# Patient Record
Sex: Female | Born: 1945
Health system: Southern US, Community
[De-identification: ages and names within clinical notes are randomized; demographics above are authoritative.]

## PROBLEM LIST (undated history)

## (undated) DIAGNOSIS — M26609 Unspecified temporomandibular joint disorder, unspecified side: Secondary | ICD-10-CM

## (undated) DIAGNOSIS — N318 Other neuromuscular dysfunction of bladder: Secondary | ICD-10-CM

## (undated) DIAGNOSIS — N959 Unspecified menopausal and perimenopausal disorder: Secondary | ICD-10-CM

## (undated) DIAGNOSIS — K6289 Other specified diseases of anus and rectum: Secondary | ICD-10-CM

## (undated) DIAGNOSIS — D509 Iron deficiency anemia, unspecified: Secondary | ICD-10-CM

## (undated) DIAGNOSIS — R252 Cramp and spasm: Secondary | ICD-10-CM

## (undated) DIAGNOSIS — IMO0001 Reserved for inherently not codable concepts without codable children: Secondary | ICD-10-CM

## (undated) DIAGNOSIS — K219 Gastro-esophageal reflux disease without esophagitis: Secondary | ICD-10-CM

## (undated) DIAGNOSIS — J309 Allergic rhinitis, unspecified: Secondary | ICD-10-CM

## (undated) DIAGNOSIS — K631 Perforation of intestine (nontraumatic): Secondary | ICD-10-CM

## (undated) DIAGNOSIS — M1990 Unspecified osteoarthritis, unspecified site: Secondary | ICD-10-CM

## (undated) DIAGNOSIS — K635 Polyp of colon: Secondary | ICD-10-CM

## (undated) DIAGNOSIS — E785 Hyperlipidemia, unspecified: Secondary | ICD-10-CM

## (undated) DIAGNOSIS — K649 Unspecified hemorrhoids: Secondary | ICD-10-CM

## (undated) DIAGNOSIS — Z8611 Personal history of tuberculosis: Secondary | ICD-10-CM

## (undated) DIAGNOSIS — K625 Hemorrhage of anus and rectum: Secondary | ICD-10-CM

## (undated) DIAGNOSIS — M5430 Sciatica, unspecified side: Secondary | ICD-10-CM

## (undated) DIAGNOSIS — R229 Localized swelling, mass and lump, unspecified: Secondary | ICD-10-CM

## (undated) DIAGNOSIS — J189 Pneumonia, unspecified organism: Secondary | ICD-10-CM

## (undated) DIAGNOSIS — L989 Disorder of the skin and subcutaneous tissue, unspecified: Secondary | ICD-10-CM

## (undated) DIAGNOSIS — M25559 Pain in unspecified hip: Secondary | ICD-10-CM

## (undated) DIAGNOSIS — M79609 Pain in unspecified limb: Secondary | ICD-10-CM

## (undated) DIAGNOSIS — I10 Essential (primary) hypertension: Secondary | ICD-10-CM

## (undated) DIAGNOSIS — Z86718 Personal history of other venous thrombosis and embolism: Secondary | ICD-10-CM

## (undated) DIAGNOSIS — M549 Dorsalgia, unspecified: Secondary | ICD-10-CM

## (undated) DIAGNOSIS — R350 Frequency of micturition: Secondary | ICD-10-CM

## (undated) DIAGNOSIS — N179 Acute kidney failure, unspecified: Secondary | ICD-10-CM

## (undated) DIAGNOSIS — Z7901 Long term (current) use of anticoagulants: Secondary | ICD-10-CM

## (undated) DIAGNOSIS — C801 Malignant (primary) neoplasm, unspecified: Secondary | ICD-10-CM

## (undated) DIAGNOSIS — G4700 Insomnia, unspecified: Secondary | ICD-10-CM

## (undated) DIAGNOSIS — I82409 Acute embolism and thrombosis of unspecified deep veins of unspecified lower extremity: Secondary | ICD-10-CM

## (undated) DIAGNOSIS — A159 Respiratory tuberculosis unspecified: Secondary | ICD-10-CM

## (undated) DIAGNOSIS — K5790 Diverticulosis of intestine, part unspecified, without perforation or abscess without bleeding: Secondary | ICD-10-CM

## (undated) HISTORY — DX: Pain in unspecified limb: M79.609

## (undated) HISTORY — DX: Cramp and spasm: R25.2

## (undated) HISTORY — DX: Allergic rhinitis, unspecified: J30.9

## (undated) HISTORY — DX: Long term (current) use of anticoagulants: Z79.01

## (undated) HISTORY — DX: Pneumonia, unspecified organism: J18.9

## (undated) HISTORY — DX: Insomnia, unspecified: G47.00

## (undated) HISTORY — DX: Hyperlipidemia, unspecified: E78.5

## (undated) HISTORY — PX: OTHER SURGICAL HISTORY: SHX169

## (undated) HISTORY — DX: Personal history of other venous thrombosis and embolism: Z86.718

## (undated) HISTORY — DX: Unspecified temporomandibular joint disorder, unspecified side: M26.609

## (undated) HISTORY — PX: JOINT REPLACEMENT: SHX530

## (undated) HISTORY — DX: Personal history of tuberculosis: Z86.11

## (undated) HISTORY — DX: Essential (primary) hypertension: I10

## (undated) HISTORY — PX: COLONOSCOPY: SHX174

## (undated) HISTORY — DX: Pain in unspecified hip: M25.559

## (undated) HISTORY — DX: Unspecified menopausal and perimenopausal disorder: N95.9

## (undated) HISTORY — DX: Unspecified osteoarthritis, unspecified site: M19.90

## (undated) HISTORY — DX: Localized swelling, mass and lump, unspecified: R22.9

## (undated) HISTORY — DX: Dorsalgia, unspecified: M54.9

## (undated) HISTORY — DX: Frequency of micturition: R35.0

## (undated) HISTORY — DX: Iron deficiency anemia, unspecified: D50.9

## (undated) HISTORY — DX: Acute embolism and thrombosis of unspecified deep veins of unspecified lower extremity: I82.409

## (undated) HISTORY — DX: Polyp of colon: K63.5

## (undated) HISTORY — DX: Unspecified hemorrhoids: K64.9

## (undated) HISTORY — DX: Other neuromuscular dysfunction of bladder: N31.8

## (undated) HISTORY — DX: Gastro-esophageal reflux disease without esophagitis: K21.9

## (undated) HISTORY — PX: ABDOMINAL HYSTERECTOMY: SHX81

## (undated) HISTORY — DX: Sciatica, unspecified side: M54.30

## (undated) HISTORY — DX: Diverticulosis of intestine, part unspecified, without perforation or abscess without bleeding: K57.90

## (undated) HISTORY — PX: CHOLECYSTECTOMY OPEN: SUR202

## (undated) HISTORY — DX: Disorder of the skin and subcutaneous tissue, unspecified: L98.9

## (undated) HISTORY — DX: Hemorrhage of anus and rectum: K62.5

---

## 1998-11-11 ENCOUNTER — Encounter: Payer: Self-pay | Admitting: General Surgery

## 1998-11-11 ENCOUNTER — Observation Stay (HOSPITAL_COMMUNITY): Admission: RE | Admit: 1998-11-11 | Discharge: 1998-11-12 | Payer: Self-pay | Admitting: General Surgery

## 2000-05-01 HISTORY — PX: INCISIONAL HERNIA REPAIR: SHX193

## 2000-05-30 ENCOUNTER — Other Ambulatory Visit: Admission: RE | Admit: 2000-05-30 | Discharge: 2000-05-30 | Payer: Self-pay | Admitting: Obstetrics and Gynecology

## 2000-12-25 ENCOUNTER — Emergency Department (HOSPITAL_COMMUNITY): Admission: EM | Admit: 2000-12-25 | Discharge: 2000-12-25 | Payer: Self-pay | Admitting: Emergency Medicine

## 2001-04-12 ENCOUNTER — Encounter (HOSPITAL_BASED_OUTPATIENT_CLINIC_OR_DEPARTMENT_OTHER): Payer: Self-pay | Admitting: General Surgery

## 2001-04-16 ENCOUNTER — Ambulatory Visit (HOSPITAL_COMMUNITY): Admission: RE | Admit: 2001-04-16 | Discharge: 2001-04-16 | Payer: Self-pay | Admitting: General Surgery

## 2001-04-16 ENCOUNTER — Encounter (INDEPENDENT_AMBULATORY_CARE_PROVIDER_SITE_OTHER): Payer: Self-pay | Admitting: *Deleted

## 2002-03-10 ENCOUNTER — Ambulatory Visit (HOSPITAL_COMMUNITY): Admission: RE | Admit: 2002-03-10 | Discharge: 2002-03-10 | Payer: Self-pay | Admitting: Obstetrics and Gynecology

## 2002-03-10 ENCOUNTER — Encounter: Payer: Self-pay | Admitting: Family Medicine

## 2002-10-17 ENCOUNTER — Ambulatory Visit (HOSPITAL_COMMUNITY): Admission: RE | Admit: 2002-10-17 | Discharge: 2002-10-17 | Payer: Self-pay | Admitting: Gastroenterology

## 2004-07-14 ENCOUNTER — Emergency Department (HOSPITAL_COMMUNITY): Admission: EM | Admit: 2004-07-14 | Discharge: 2004-07-14 | Payer: Self-pay | Admitting: Emergency Medicine

## 2004-08-15 ENCOUNTER — Ambulatory Visit: Payer: Self-pay | Admitting: Internal Medicine

## 2004-08-15 ENCOUNTER — Ambulatory Visit: Payer: Self-pay | Admitting: *Deleted

## 2004-09-12 ENCOUNTER — Ambulatory Visit: Payer: Self-pay | Admitting: Internal Medicine

## 2004-12-05 ENCOUNTER — Ambulatory Visit: Admission: RE | Admit: 2004-12-05 | Discharge: 2004-12-05 | Payer: Self-pay | Admitting: General Surgery

## 2004-12-05 ENCOUNTER — Ambulatory Visit: Payer: Self-pay | Admitting: Internal Medicine

## 2005-01-07 ENCOUNTER — Emergency Department (HOSPITAL_COMMUNITY): Admission: EM | Admit: 2005-01-07 | Discharge: 2005-01-07 | Payer: Self-pay | Admitting: Emergency Medicine

## 2005-01-13 ENCOUNTER — Inpatient Hospital Stay (HOSPITAL_COMMUNITY): Admission: RE | Admit: 2005-01-13 | Discharge: 2005-01-16 | Payer: Self-pay | Admitting: General Surgery

## 2005-01-13 ENCOUNTER — Encounter (INDEPENDENT_AMBULATORY_CARE_PROVIDER_SITE_OTHER): Payer: Self-pay | Admitting: Specialist

## 2005-01-13 HISTORY — PX: INCISIONAL HERNIA REPAIR: SHX193

## 2005-02-02 ENCOUNTER — Ambulatory Visit: Payer: Self-pay | Admitting: Internal Medicine

## 2005-06-29 ENCOUNTER — Ambulatory Visit: Payer: Self-pay | Admitting: Internal Medicine

## 2005-08-18 ENCOUNTER — Ambulatory Visit: Payer: Self-pay | Admitting: Endocrinology

## 2005-09-12 ENCOUNTER — Ambulatory Visit: Payer: Self-pay | Admitting: Internal Medicine

## 2005-10-03 ENCOUNTER — Ambulatory Visit: Payer: Self-pay | Admitting: Internal Medicine

## 2005-10-11 ENCOUNTER — Encounter: Admission: RE | Admit: 2005-10-11 | Discharge: 2005-10-11 | Payer: Self-pay | Admitting: Internal Medicine

## 2006-01-09 ENCOUNTER — Ambulatory Visit: Payer: Self-pay | Admitting: Internal Medicine

## 2006-01-19 ENCOUNTER — Ambulatory Visit: Payer: Self-pay | Admitting: Cardiovascular Disease

## 2006-02-02 ENCOUNTER — Ambulatory Visit: Payer: Self-pay | Admitting: Endocrinology

## 2006-02-09 ENCOUNTER — Ambulatory Visit: Payer: Self-pay | Admitting: Cardiology

## 2006-03-02 ENCOUNTER — Ambulatory Visit: Payer: Self-pay | Admitting: Cardiology

## 2006-03-20 ENCOUNTER — Ambulatory Visit: Payer: Self-pay | Admitting: Cardiovascular Disease

## 2006-04-19 ENCOUNTER — Ambulatory Visit: Payer: Self-pay | Admitting: Cardiology

## 2006-04-26 ENCOUNTER — Ambulatory Visit: Payer: Self-pay | Admitting: Internal Medicine

## 2006-04-26 LAB — CONVERTED CEMR LAB
BUN: 11 mg/dL (ref 6–23)
Chloride: 110 meq/L (ref 96–112)
Creatinine, Ser: 1.1 mg/dL (ref 0.4–1.2)
Eosinophil percent: 2.3 % (ref 0.0–5.0)
GFR calc non Af Amer: 54 mL/min
Glucose, Bld: 109 mg/dL — ABNORMAL HIGH (ref 70–99)
HCT: 32.9 % — ABNORMAL LOW (ref 36.0–46.0)
Hemoglobin: 10.6 g/dL — ABNORMAL LOW (ref 12.0–15.0)
Lymphocytes Relative: 21.2 % (ref 12.0–46.0)
MCHC: 32.2 g/dL (ref 30.0–36.0)
Neutro Abs: 5.1 10*3/uL (ref 1.4–7.7)
Neutrophils Relative %: 72.1 % (ref 43.0–77.0)
Potassium: 3.6 meq/L (ref 3.5–5.1)
RBC: 4.81 M/uL (ref 3.87–5.11)
RDW: 17.8 % — ABNORMAL HIGH (ref 11.5–14.6)
Sodium: 144 meq/L (ref 135–145)
Total Bilirubin: 0.6 mg/dL (ref 0.3–1.2)

## 2006-05-08 ENCOUNTER — Ambulatory Visit: Payer: Self-pay | Admitting: Internal Medicine

## 2006-05-08 LAB — CONVERTED CEMR LAB
Basophils Absolute: 0.1 10*3/uL (ref 0.0–0.1)
Basophils Relative: 0.7 % (ref 0.0–1.0)
Eosinophil percent: 2.1 % (ref 0.0–5.0)
Folate: 3.9 ng/mL
HCT: 33.7 % — ABNORMAL LOW (ref 36.0–46.0)
MCV: 68.5 fL — ABNORMAL LOW (ref 78.0–100.0)
Monocytes Absolute: 0.4 10*3/uL (ref 0.2–0.7)
Monocytes Relative: 5.4 % (ref 3.0–11.0)
Neutro Abs: 5 10*3/uL (ref 1.4–7.7)
Neutrophils Relative %: 69.2 % (ref 43.0–77.0)
Platelets: 521 10*3/uL — ABNORMAL HIGH (ref 150–400)
RBC: 4.92 M/uL (ref 3.87–5.11)
RDW: 17.7 % — ABNORMAL HIGH (ref 11.5–14.6)

## 2006-05-21 ENCOUNTER — Ambulatory Visit: Payer: Self-pay | Admitting: Cardiology

## 2006-06-18 ENCOUNTER — Ambulatory Visit: Payer: Self-pay | Admitting: Internal Medicine

## 2006-09-05 ENCOUNTER — Ambulatory Visit: Payer: Self-pay | Admitting: Internal Medicine

## 2006-09-05 LAB — CONVERTED CEMR LAB
Alkaline Phosphatase: 128 units/L — ABNORMAL HIGH (ref 39–117)
BUN: 9 mg/dL (ref 6–23)
Basophils Absolute: 0 10*3/uL (ref 0.0–0.1)
Bilirubin Urine: NEGATIVE
CO2: 30 meq/L (ref 19–32)
Chloride: 110 meq/L (ref 96–112)
Creatinine, Ser: 0.9 mg/dL (ref 0.4–1.2)
GFR calc Af Amer: 83 mL/min
GFR calc non Af Amer: 68 mL/min
HCT: 35.8 % — ABNORMAL LOW (ref 36.0–46.0)
Lymphocytes Relative: 19.7 % (ref 12.0–46.0)
Monocytes Relative: 4.5 % (ref 3.0–11.0)
Neutrophils Relative %: 72.5 % (ref 43.0–77.0)
Nitrite: NEGATIVE
Platelets: 439 10*3/uL — ABNORMAL HIGH (ref 150–400)
Potassium: 3.8 meq/L (ref 3.5–5.1)
TSH: 0.81 microintl units/mL (ref 0.35–5.50)
Total Bilirubin: 1 mg/dL (ref 0.3–1.2)
Total Protein, Urine: NEGATIVE mg/dL
Triglycerides: 143 mg/dL (ref 0–149)
Urobilinogen, UA: 0.2 (ref 0.0–1.0)
VLDL: 29 mg/dL (ref 0–40)

## 2006-11-17 DIAGNOSIS — I10 Essential (primary) hypertension: Secondary | ICD-10-CM

## 2006-11-17 DIAGNOSIS — Z8611 Personal history of tuberculosis: Secondary | ICD-10-CM

## 2006-11-17 DIAGNOSIS — M26609 Unspecified temporomandibular joint disorder, unspecified side: Secondary | ICD-10-CM | POA: Insufficient documentation

## 2006-11-17 DIAGNOSIS — E785 Hyperlipidemia, unspecified: Secondary | ICD-10-CM | POA: Insufficient documentation

## 2006-11-17 DIAGNOSIS — Z86718 Personal history of other venous thrombosis and embolism: Secondary | ICD-10-CM | POA: Insufficient documentation

## 2006-11-17 DIAGNOSIS — K649 Unspecified hemorrhoids: Secondary | ICD-10-CM | POA: Insufficient documentation

## 2006-11-17 HISTORY — DX: Unspecified temporomandibular joint disorder, unspecified side: M26.609

## 2006-11-17 HISTORY — DX: Personal history of other venous thrombosis and embolism: Z86.718

## 2006-11-17 HISTORY — DX: Unspecified hemorrhoids: K64.9

## 2006-11-17 HISTORY — DX: Essential (primary) hypertension: I10

## 2006-11-17 HISTORY — DX: Personal history of tuberculosis: Z86.11

## 2006-12-05 DIAGNOSIS — K219 Gastro-esophageal reflux disease without esophagitis: Secondary | ICD-10-CM

## 2006-12-05 HISTORY — DX: Gastro-esophageal reflux disease without esophagitis: K21.9

## 2007-09-05 ENCOUNTER — Ambulatory Visit: Payer: Self-pay | Admitting: Internal Medicine

## 2007-09-05 LAB — CONVERTED CEMR LAB
BUN: 13 mg/dL (ref 6–23)
Bilirubin, Direct: 0.1 mg/dL (ref 0.0–0.3)
CO2: 29 meq/L (ref 19–32)
Creatinine, Ser: 0.9 mg/dL (ref 0.4–1.2)
Crystals: NEGATIVE
Eosinophils Absolute: 0.2 10*3/uL (ref 0.0–0.7)
Eosinophils Relative: 2.5 % (ref 0.0–5.0)
GFR calc non Af Amer: 68 mL/min
Glucose, Bld: 96 mg/dL (ref 70–99)
HDL: 30 mg/dL — ABNORMAL LOW (ref >39.0)
Ketones, ur: NEGATIVE mg/dL
LDL Cholesterol: 123 mg/dL — ABNORMAL HIGH (ref 0–99)
Mucus, UA: NEGATIVE
Neutro Abs: 5.2 10*3/uL (ref 1.4–7.7)
Platelets: 396 10*3/uL (ref 150–400)
RBC / HPF: NONE SEEN
RDW: 16 % — ABNORMAL HIGH (ref 11.5–14.6)
Specific Gravity, Urine: 1.01 (ref 1.000–1.03)
TSH: 0.6 microintl units/mL (ref 0.35–5.50)
Total Bilirubin: 1 mg/dL (ref 0.3–1.2)
Total Protein, Urine: NEGATIVE mg/dL
Urine Glucose: NEGATIVE mg/dL
VLDL: 25 mg/dL (ref 0–40)
WBC, UA: NONE SEEN cells/hpf
pH: 6 (ref 5.0–8.0)

## 2007-09-11 ENCOUNTER — Encounter: Admission: RE | Admit: 2007-09-11 | Discharge: 2007-09-11 | Payer: Self-pay | Admitting: Internal Medicine

## 2007-09-12 ENCOUNTER — Ambulatory Visit: Payer: Self-pay | Admitting: Internal Medicine

## 2007-09-12 DIAGNOSIS — J309 Allergic rhinitis, unspecified: Secondary | ICD-10-CM

## 2007-09-12 DIAGNOSIS — M5430 Sciatica, unspecified side: Secondary | ICD-10-CM

## 2007-09-12 DIAGNOSIS — R229 Localized swelling, mass and lump, unspecified: Secondary | ICD-10-CM

## 2007-09-12 DIAGNOSIS — K921 Melena: Secondary | ICD-10-CM

## 2007-09-12 DIAGNOSIS — E785 Hyperlipidemia, unspecified: Secondary | ICD-10-CM

## 2007-09-12 HISTORY — DX: Allergic rhinitis, unspecified: J30.9

## 2007-09-12 HISTORY — DX: Sciatica, unspecified side: M54.30

## 2007-09-12 HISTORY — DX: Hyperlipidemia, unspecified: E78.5

## 2007-09-12 HISTORY — DX: Localized swelling, mass and lump, unspecified: R22.9

## 2007-09-13 ENCOUNTER — Encounter (INDEPENDENT_AMBULATORY_CARE_PROVIDER_SITE_OTHER): Payer: Self-pay | Admitting: *Deleted

## 2007-09-17 ENCOUNTER — Encounter: Payer: Self-pay | Admitting: Internal Medicine

## 2007-10-07 ENCOUNTER — Ambulatory Visit: Payer: Self-pay | Admitting: Gastroenterology

## 2007-10-07 DIAGNOSIS — K625 Hemorrhage of anus and rectum: Secondary | ICD-10-CM

## 2007-10-07 HISTORY — DX: Hemorrhage of anus and rectum: K62.5

## 2007-10-14 ENCOUNTER — Encounter: Payer: Self-pay | Admitting: Gastroenterology

## 2007-10-14 ENCOUNTER — Ambulatory Visit: Payer: Self-pay | Admitting: Gastroenterology

## 2007-10-16 ENCOUNTER — Encounter: Payer: Self-pay | Admitting: Gastroenterology

## 2008-06-19 ENCOUNTER — Ambulatory Visit: Payer: Self-pay | Admitting: Internal Medicine

## 2008-06-19 DIAGNOSIS — G4700 Insomnia, unspecified: Secondary | ICD-10-CM

## 2008-06-19 DIAGNOSIS — M79609 Pain in unspecified limb: Secondary | ICD-10-CM | POA: Insufficient documentation

## 2008-06-19 DIAGNOSIS — M549 Dorsalgia, unspecified: Secondary | ICD-10-CM | POA: Insufficient documentation

## 2008-06-19 HISTORY — DX: Insomnia, unspecified: G47.00

## 2008-06-19 HISTORY — DX: Dorsalgia, unspecified: M54.9

## 2008-06-19 HISTORY — DX: Pain in unspecified limb: M79.609

## 2008-07-10 ENCOUNTER — Telehealth (INDEPENDENT_AMBULATORY_CARE_PROVIDER_SITE_OTHER): Payer: Self-pay | Admitting: *Deleted

## 2008-07-28 ENCOUNTER — Telehealth: Payer: Self-pay | Admitting: Internal Medicine

## 2008-09-10 ENCOUNTER — Ambulatory Visit: Payer: Self-pay | Admitting: Internal Medicine

## 2008-09-10 LAB — CONVERTED CEMR LAB
AST: 17 units/L (ref 0–37)
BUN: 9 mg/dL (ref 6–23)
Basophils Relative: 2.7 % (ref 0.0–3.0)
Bilirubin Urine: NEGATIVE
Bilirubin, Direct: 0.2 mg/dL (ref 0.0–0.3)
Creatinine, Ser: 1 mg/dL (ref 0.4–1.2)
Eosinophils Absolute: 0.1 10*3/uL (ref 0.0–0.7)
HDL: 39.6 mg/dL (ref >39.00)
Leukocytes, UA: NEGATIVE
Lymphocytes Relative: 22.1 % (ref 12.0–46.0)
Lymphs Abs: 1.6 10*3/uL (ref 0.7–4.0)
Neutro Abs: 5.3 10*3/uL (ref 1.4–7.7)
Neutrophils Relative %: 70.7 % (ref 43.0–77.0)
Nitrite: NEGATIVE
Potassium: 4.1 meq/L (ref 3.5–5.1)
RBC: 5.08 M/uL (ref 3.87–5.11)
Sodium: 141 meq/L (ref 135–145)
Specific Gravity, Urine: 1.01 (ref 1.000–1.030)
Total Bilirubin: 1 mg/dL (ref 0.3–1.2)
Total CHOL/HDL Ratio: 4
Total Protein: 8.7 g/dL — ABNORMAL HIGH (ref 6.0–8.3)
Urine Glucose: NEGATIVE mg/dL
Urobilinogen, UA: 0.2 (ref 0.0–1.0)
pH: 5.5 (ref 5.0–8.0)

## 2008-09-15 ENCOUNTER — Encounter: Admission: RE | Admit: 2008-09-15 | Discharge: 2008-09-15 | Payer: Self-pay | Admitting: Internal Medicine

## 2008-09-15 ENCOUNTER — Ambulatory Visit: Payer: Self-pay | Admitting: Internal Medicine

## 2008-09-17 LAB — CONVERTED CEMR LAB
Alpha-1-Globulin: 4.4 % (ref 2.9–4.9)
Alpha-2-Globulin: 11.5 % (ref 7.1–11.8)

## 2009-01-06 ENCOUNTER — Telehealth: Payer: Self-pay | Admitting: Internal Medicine

## 2009-04-28 ENCOUNTER — Telehealth: Payer: Self-pay | Admitting: Internal Medicine

## 2009-05-01 HISTORY — PX: OTHER SURGICAL HISTORY: SHX169

## 2009-07-20 ENCOUNTER — Ambulatory Visit: Payer: Self-pay | Admitting: Internal Medicine

## 2009-07-20 DIAGNOSIS — L989 Disorder of the skin and subcutaneous tissue, unspecified: Secondary | ICD-10-CM

## 2009-07-20 HISTORY — DX: Disorder of the skin and subcutaneous tissue, unspecified: L98.9

## 2009-08-02 ENCOUNTER — Encounter: Admission: RE | Admit: 2009-08-02 | Discharge: 2009-08-02 | Payer: Self-pay | Admitting: Orthopedic Surgery

## 2009-08-11 ENCOUNTER — Ambulatory Visit: Payer: Self-pay | Admitting: Internal Medicine

## 2009-08-11 DIAGNOSIS — R252 Cramp and spasm: Secondary | ICD-10-CM

## 2009-08-11 DIAGNOSIS — M25559 Pain in unspecified hip: Secondary | ICD-10-CM

## 2009-08-11 HISTORY — DX: Cramp and spasm: R25.2

## 2009-08-11 HISTORY — DX: Pain in unspecified hip: M25.559

## 2009-09-16 ENCOUNTER — Ambulatory Visit: Payer: Self-pay | Admitting: Internal Medicine

## 2009-09-16 LAB — CONVERTED CEMR LAB
ALT: 20 units/L (ref 0–35)
Alkaline Phosphatase: 110 units/L (ref 39–117)
BUN: 15 mg/dL (ref 6–23)
Basophils Absolute: 0 10*3/uL (ref 0.0–0.1)
Basophils Relative: 0.5 % (ref 0.0–3.0)
Bilirubin Urine: NEGATIVE
Calcium: 8.9 mg/dL (ref 8.4–10.5)
Chloride: 108 meq/L (ref 96–112)
Cholesterol: 148 mg/dL (ref 0–200)
Creatinine, Ser: 0.9 mg/dL (ref 0.4–1.2)
GFR calc non Af Amer: 79.6 mL/min (ref >60)
HCT: 35.8 % — ABNORMAL LOW (ref 36.0–46.0)
Hemoglobin: 12.1 g/dL (ref 12.0–15.0)
LDL Cholesterol: 79 mg/dL (ref 0–99)
Lymphs Abs: 1.8 10*3/uL (ref 0.7–4.0)
MCHC: 33.8 g/dL (ref 30.0–36.0)
MCV: 78 fL (ref 78.0–100.0)
Monocytes Absolute: 0.4 10*3/uL (ref 0.1–1.0)
Neutrophils Relative %: 64.4 % (ref 43.0–77.0)
Nitrite: NEGATIVE
RDW: 17.9 % — ABNORMAL HIGH (ref 11.5–14.6)
Sodium: 141 meq/L (ref 135–145)
Specific Gravity, Urine: 1.015 (ref 1.000–1.030)
Total Protein, Urine: NEGATIVE mg/dL
Urine Glucose: NEGATIVE mg/dL
Urobilinogen, UA: 0.2 (ref 0.0–1.0)
VLDL: 31 mg/dL (ref 0.0–40.0)
pH: 7 (ref 5.0–8.0)

## 2009-09-17 ENCOUNTER — Encounter: Admission: RE | Admit: 2009-09-17 | Discharge: 2009-09-17 | Payer: Self-pay | Admitting: Internal Medicine

## 2009-09-20 ENCOUNTER — Ambulatory Visit: Payer: Self-pay | Admitting: Internal Medicine

## 2009-09-20 DIAGNOSIS — N959 Unspecified menopausal and perimenopausal disorder: Secondary | ICD-10-CM

## 2009-09-20 HISTORY — DX: Unspecified menopausal and perimenopausal disorder: N95.9

## 2009-09-28 ENCOUNTER — Encounter: Admission: RE | Admit: 2009-09-28 | Discharge: 2009-09-28 | Payer: Self-pay | Admitting: Orthopedic Surgery

## 2009-09-28 ENCOUNTER — Ambulatory Visit: Payer: Self-pay | Admitting: Internal Medicine

## 2009-09-28 ENCOUNTER — Encounter: Payer: Self-pay | Admitting: Internal Medicine

## 2009-10-07 ENCOUNTER — Ambulatory Visit: Payer: Self-pay | Admitting: Internal Medicine

## 2009-10-07 DIAGNOSIS — R350 Frequency of micturition: Secondary | ICD-10-CM

## 2009-10-07 HISTORY — DX: Frequency of micturition: R35.0

## 2009-10-07 LAB — CONVERTED CEMR LAB
Bilirubin Urine: NEGATIVE
Ketones, ur: NEGATIVE mg/dL
Urine Glucose: NEGATIVE mg/dL

## 2009-10-12 ENCOUNTER — Emergency Department (HOSPITAL_COMMUNITY): Admission: EM | Admit: 2009-10-12 | Discharge: 2009-10-12 | Payer: Self-pay | Admitting: Family Medicine

## 2009-11-02 ENCOUNTER — Ambulatory Visit: Payer: Self-pay | Admitting: Internal Medicine

## 2009-11-11 ENCOUNTER — Telehealth: Payer: Self-pay | Admitting: Internal Medicine

## 2010-02-27 ENCOUNTER — Ambulatory Visit: Payer: Self-pay | Admitting: Vascular Surgery

## 2010-02-27 ENCOUNTER — Encounter (INDEPENDENT_AMBULATORY_CARE_PROVIDER_SITE_OTHER): Payer: Self-pay | Admitting: Emergency Medicine

## 2010-02-27 ENCOUNTER — Emergency Department (HOSPITAL_COMMUNITY): Admission: EM | Admit: 2010-02-27 | Discharge: 2010-02-27 | Payer: Self-pay | Admitting: Emergency Medicine

## 2010-03-04 ENCOUNTER — Ambulatory Visit: Payer: Self-pay | Admitting: Internal Medicine

## 2010-03-04 DIAGNOSIS — I82409 Acute embolism and thrombosis of unspecified deep veins of unspecified lower extremity: Secondary | ICD-10-CM | POA: Insufficient documentation

## 2010-03-04 DIAGNOSIS — N318 Other neuromuscular dysfunction of bladder: Secondary | ICD-10-CM

## 2010-03-04 HISTORY — DX: Other neuromuscular dysfunction of bladder: N31.8

## 2010-03-04 HISTORY — DX: Acute embolism and thrombosis of unspecified deep veins of unspecified lower extremity: I82.409

## 2010-03-04 LAB — CONVERTED CEMR LAB
Basophils Absolute: 0 10*3/uL (ref 0.0–0.1)
Basophils Relative: 0.5 % (ref 0.0–3.0)
Eosinophils Absolute: 0.2 10*3/uL (ref 0.0–0.7)
Eosinophils Relative: 2.1 % (ref 0.0–5.0)
INR: 1.8 — ABNORMAL HIGH (ref 0.8–1.0)
Lymphocytes Relative: 26.6 % (ref 12.0–46.0)
MCHC: 33.3 g/dL (ref 30.0–36.0)
Monocytes Absolute: 0.3 10*3/uL (ref 0.1–1.0)
Neutrophils Relative %: 67.1 % (ref 43.0–77.0)
Prothrombin Time: 19.1 s — ABNORMAL HIGH (ref 9.7–11.8)

## 2010-03-07 ENCOUNTER — Ambulatory Visit: Payer: Self-pay | Admitting: Internal Medicine

## 2010-03-07 LAB — CONVERTED CEMR LAB: Prothrombin Time: 35.9 s — ABNORMAL HIGH (ref 9.7–11.8)

## 2010-03-11 ENCOUNTER — Ambulatory Visit: Payer: Self-pay | Admitting: Cardiology

## 2010-03-11 LAB — CONVERTED CEMR LAB: POC INR: 2.8

## 2010-03-16 ENCOUNTER — Ambulatory Visit: Payer: Self-pay | Admitting: Internal Medicine

## 2010-03-18 ENCOUNTER — Ambulatory Visit: Payer: Self-pay | Admitting: Cardiology

## 2010-03-18 LAB — CONVERTED CEMR LAB: POC INR: 2.3

## 2010-03-23 ENCOUNTER — Encounter: Payer: Self-pay | Admitting: Internal Medicine

## 2010-03-23 ENCOUNTER — Ambulatory Visit: Payer: Self-pay | Admitting: Cardiology

## 2010-03-23 LAB — CONVERTED CEMR LAB: POC INR: 2.2

## 2010-04-06 ENCOUNTER — Ambulatory Visit: Payer: Self-pay | Admitting: Internal Medicine

## 2010-05-04 ENCOUNTER — Ambulatory Visit: Admission: RE | Admit: 2010-05-04 | Discharge: 2010-05-04 | Payer: Self-pay | Source: Home / Self Care

## 2010-05-25 ENCOUNTER — Ambulatory Visit: Admission: RE | Admit: 2010-05-25 | Discharge: 2010-05-25 | Payer: Self-pay | Source: Home / Self Care

## 2010-06-02 NOTE — Medication Information (Signed)
Summary: rov coumadin - [facility]   Anticoagulant Therapy  Managed by: Weston Brass, PharmD Referring MD: Dr. Oliver Barre PCP: Oliver Barre MD Supervising MD: Antoine Poche MD, Fayrene Fearing Indication 1: DVT/PE (recurrent or continuing risk factors) Lab Used: [facility] [place] Site: [address] INR POC 1.6 INR RANGE 2.0-3.0  Dietary changes: no    Health status changes: no    Bleeding/hemorrhagic complications: no    Recent/future hospitalizations: no    Any changes in medication regimen? no    Recent/future dental: no  Any missed doses?: no       Is patient compliant with meds? yes       Allergies: No Known Drug Allergies  Anticoagulation Management History:      The patient is taking warfarin and comes in today for a routine follow up visit.  Positive risk factors for bleeding include history of GI bleeding.  Negative risk factors for bleeding include an age less than 46 years old.  The bleeding index is 'intermediate risk'.  Positive CHADS2 values include History of HTN.  Negative CHADS2 values include Age > 34 years old.  Her last INR was 3.4 ratio.  Anticoagulation responsible provider: Antoine Poche MD, Fayrene Fearing.  INR POC: 1.6.  Cuvette Lot#: [id number].  Exp: 06/2011.    Anticoagulation Management Assessment/Plan:      The patient's current anticoagulation dose is Coumadin 5 mg tabs: use asd - 1 by mouth once daily.  The next INR is due 05/25/2010.  Results were reviewed/authorized by Weston Brass, PharmD.  She was notified by Stephannie Peters, PharmD Candidate .         Prior Anticoagulation Instructions: INR 2.1  Couamdin 5mg  tabs - take 1 tab each day  Current Anticoagulation Instructions: INR 1.6  Coumadin 5 mg tabs - Take extra half tablet today and tomorrow. Then continue 1 tablet daily.

## 2010-06-02 NOTE — Assessment & Plan Note (Signed)
Summary: right hip pain-lb   Vital Signs:  Patient profile:   65 year old female Height:      65 inches Weight:      222.50 pounds BMI:     37.16 O2 Sat:      99 % on Room air Temp:     98.2 degrees F oral Pulse rate:   73 / minute BP sitting:   142 / 90  (left arm) Cuff size:   large  Vitals Entered By: Zella Ball Ewing CMA (AAMA) (November 02, 2009 12:02 PM)  O2 Flow:  Room air CC: Right Hip Pain/RE   Primary Care Provider:  Oliver Barre MD  CC:  Right Hip Pain/RE.  History of Present Illness: here wtih ongoing right hip pain, due for right hip replacement later this year at [facility] orth[facility], and no change overall in severity; worse to ambulate but also aches with sitting or standing,    also vesicare seems to be working for the OAB symtpoms, denies worsening freq, urgency, incontinence or hematuria.    Pt denies CP, sob, doe, wheezing, orthopnea, pnd, worsening LE edema, palps, dizziness or syncope   Pt denies new neuro symptoms such as headache, facial or extremity weakness  No fever, wt loss, night sweats, loss of appetite or other constitutional symptoms    Problems Prior to Update: 1)  Frequency, Urinary  (ICD-788.41) 2)  Muscle Cramps  (ICD-729.82) 3)  Menopausal Disorder  (ICD-627.9) 4)  Hip Pain, Right, Chronic  (ICD-719.45) 5)  Leg Cramps  (ICD-729.82) 6)  Skin Lesion  (ICD-709.9) 7)  Other Nonspecific Findings Examination of Blood  (ICD-790.99) 8)  Preventive Health Care  (ICD-V70.0) 9)  Insomnia-sleep Disorder-unspec  (ICD-780.52) 10)  Back Pain  (ICD-724.5) 11)  Leg Pain, Right  (ICD-729.5) 12)  Family Hx Colon Cancer  (ICD-V16.0) 13)  Rectal Bleeding  (ICD-569.3) 14)  Allergic Rhinitis  (ICD-477.9) 15)  Mass, Superficial  (ICD-782.2) 16)  Sciatica, Right  (ICD-724.3) 17)  Hematochezia  (ICD-578.1) 18)  Preventive Health Care  (ICD-V70.0) 19)  Hyperlipidemia  (ICD-272.4) 20)  Pulmonary Embolism, Hx of  (ICD-V12.51) 21)  Tmj Syndrome  (ICD-524.60) 22)  Hemorrhoids   (ICD-455.6) 23)  Gerd  (ICD-530.81) 24)  Hx, Personal, Tuberculosis  (ICD-V12.01) 25)  Hypertension  (ICD-401.9)  Medications Prior to Update: 1)  Omeprazole 20 Mg  Cpdr (Omeprazole) .... 2 Po Once Daily 2)  Benazepril Hcl 40 Mg Tabs (Benazepril Hcl) .Marland Kitchen.. 1 By Mouth Once Daily 3)  Adult Aspirin Ec Low Strength 81 Mg  Tbec (Aspirin) .Marland Kitchen.. 1po Qd 4)  Fluticasone Propionate 50 Mcg/act  Susp (Fluticasone Propionate) .... 2 Spray/side Qd 5)  Pravachol 20 Mg  Tabs (Pravastatin Sodium) .Marland Kitchen.. 1po Qd 6)  Zolpidem Tartrate 10 Mg Tabs (Zolpidem Tartrate) .Marland Kitchen.. 1 By Mouth At Bedtime As Needed Sleep 7)  Diclofenac Sodium 75 Mg Tbec (Diclofenac Sodium) .Marland Kitchen.. 1 By Mouth 2 Times A Day 8)  Robaxin-750 750 Mg Tabs (Methocarbamol) .Marland Kitchen.. 1 By Mouth Two Times A Day As Needed 9)  Diazepam 5 Mg Tabs (Diazepam) .Marland Kitchen.. 1po Two Times A Day  Current Medications (verified): 1)  Omeprazole 20 Mg  Cpdr (Omeprazole) .... 2 Po Once Daily 2)  Benazepril Hcl 40 Mg Tabs (Benazepril Hcl) .Marland Kitchen.. 1 By Mouth Once Daily 3)  Adult Aspirin Ec Low Strength 81 Mg  Tbec (Aspirin) .Marland Kitchen.. 1po Qd 4)  Fluticasone Propionate 50 Mcg/act  Susp (Fluticasone Propionate) .... 2 Spray/side Qd 5)  Pravachol 20 Mg  Tabs (Pravastatin  Sodium) .Marland Kitchen.. 1po Qd 6)  Zolpidem Tartrate 10 Mg Tabs (Zolpidem Tartrate) .Marland Kitchen.. 1 By Mouth At Bedtime As Needed Sleep 7)  Hydrocodone-Acetaminophen 7.5-750 Mg Tabs (Hydrocodone-Acetaminophen) .Marland Kitchen.. 1 By Mouth Two Times A Day As Needed 8)  Robaxin-750 750 Mg Tabs (Methocarbamol) .Marland Kitchen.. 1 By Mouth Two Times A Day As Needed 9)  Diazepam 5 Mg Tabs (Diazepam) .Marland Kitchen.. 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   Head:  normocephalic and atraumatic.   Eyes:  vision grossly intact, pupils equal, and pupils round.   Ears:  R ear normal and L ear normal.   Nose:  no external deformity and no nasal discharge.   Mouth:  no gingival abnormalities and pharynx pink and moist.   Neck:  supple and no masses.   Lungs:  normal respiratory effort and normal breath sounds.   Heart:  normal rate and regular rhythm.   Abdomen:  soft, non-tender, and normal bowel sounds.   Msk:  no flank tender, has mod tender over the greater trochanter Extremities:  no edema, no erythema  Neurologic:  strength normal in all extremities and gait somewhat antalgia favoring the RLE     Impression & Recommendations:  Problem # 1:  HIP PAIN, RIGHT, CHRONIC (ICD-719.45)  Her updated medication list for this problem includes:    Adult Aspirin Ec Low Strength 81 Mg Tbec (Aspirin) .Marland Kitchen... 1po qd    Hydrocodone-acetaminophen 7.5-750 Mg Tabs (Hydrocodone-acetaminophen) .Marland Kitchen... 1 by mouth two times a day as needed    Robaxin-750 750 Mg Tabs (Methocarbamol) .Marland Kitchen... 1 by mouth two times a day as needed stable overall by hx and exam, ok to continue meds/tx as is , refills done  Problem # 2:  FREQUENCY, URINARY (ICD-788.41)  c/w OAB - to cont the vesicare  Her updated medication list for this problem includes:    Vesicare 5 Mg Tabs (Solifenacin succinate) .Marland Kitchen... 1 by mouth once daily  Problem # 3:  HYPERTENSION (ICD-401.9)  Her updated medication list for this problem includes:     Benazepril Hcl 40 Mg Tabs (Benazepril hcl) .Marland Kitchen... 1 by mouth once daily  BP today: 142/90 Prior BP: 116/82 (10/07/2009)  Labs Reviewed: K+: 4.4 (09/16/2009) Creat: : 0.9 (09/16/2009)   Chol: 148 (09/16/2009)   HDL: 38.00 (09/16/2009)   LDL: 79 (09/16/2009)   TG: 155.0 (09/16/2009) mild elev today, likely situational, ok to follow, continue same treatment   Complete Medication List: 1)  Omeprazole 20 Mg Cpdr (Omeprazole) .... 2 po once daily 2)  Benazepril Hcl 40 Mg Tabs (Benazepril hcl) .Marland Kitchen.. 1 by mouth once daily 3)  Adult Aspirin Ec Low Strength 81 Mg Tbec (Aspirin) .Marland Kitchen.. 1po qd 4)  Fluticasone Propionate 50 Mcg/act Susp (Fluticasone propionate) .... 2 spray/side qd 5)  Pravachol 20 Mg Tabs (Pravastatin sodium) .Marland KitchenMarland KitchenMarland Kitchen  1po qd 6)  Zolpidem Tartrate 10 Mg Tabs (Zolpidem tartrate) .Marland Kitchen.. 1 by mouth at bedtime as needed sleep 7)  Hydrocodone-acetaminophen 7.5-750 Mg Tabs (Hydrocodone-acetaminophen) .Marland Kitchen.. 1 by mouth two times a day as needed 8)  Robaxin-750 750 Mg Tabs (Methocarbamol) .Marland Kitchen.. 1 by mouth two times a day as needed 9)  Diazepam 5 Mg Tabs (Diazepam) .Marland Kitchen.. 1po two times a day 10)  Vesicare 5 Mg Tabs (Solifenacin succinate) .Marland Kitchen.. 1 by mouth once daily  Patient Instructions: 1)  Please take all new medications as prescribed 2)  Continue all previous medications as before this visit  3)  please keep your appt with [facility] for the minimally invasive hip replacement 4)  Please schedule a follow-up appointment as needed. Prescriptions: HYDROCODONE-ACETAMINOPHEN 7.5-750 MG TABS (HYDROCODONE-ACETAMINOPHEN) 1 by mouth two times a day as needed  #60 x 2   Entered and Authorized by:   Corwin Levins MD   Signed by:   Corwin Levins MD on 11/02/2009   Method used:   Print then Give to Patient   RxID:   [id number]

## 2010-06-02 NOTE — Medication Information (Signed)
Summary: rov/mw      Allergies Added: NKDA Anticoagulant Therapy  Managed by: Leota Sauers, PharmD, BCPS, CPP Referring MD: Dr. Oliver Barre PCP: Oliver Barre MD Supervising MD: Shirlee Latch MD, Dalton Indication 1: DVT/PE (recurrent or continuing risk factors) Lab Used: [facility]  Site: [address] INR POC 2.2 INR RANGE 2.0-3.0  Dietary changes: no    Health status changes: no    Bleeding/hemorrhagic complications: no    Recent/future hospitalizations: no    Any changes in medication regimen? no    Recent/future dental: no  Any missed doses?: no       Is patient compliant with meds? yes       Current Medications (verified): 1)  Omeprazole 20 Mg  Cpdr (Omeprazole) .... 2 Po Once Daily 2)  Benazepril Hcl 40 Mg Tabs (Benazepril Hcl) .Marland Kitchen.. 1 By Mouth Once Daily 3)  Adult Aspirin Ec Low Strength 81 Mg  Tbec (Aspirin) .Marland Kitchen.. 1po Qd 4)  Fluticasone Propionate 50 Mcg/act  Susp (Fluticasone Propionate) .... 2 Spray/side Qd 5)  Pravachol 20 Mg  Tabs (Pravastatin Sodium) .Marland Kitchen.. 1po Qd 6)  Zolpidem Tartrate 10 Mg Tabs (Zolpidem Tartrate) .Marland Kitchen.. 1 By Mouth At Bedtime As Needed Sleep 7)  Coumadin 5 Mg Tabs (Warfarin Sodium) .... Use Asd - 1 By Mouth Once Daily 8)  B Complex  Tabs (B Complex Vitamins) .... Take 1 Tablet Daily.  Allergies (verified): No Known Drug Allergies  Anticoagulation Management History:      The patient is taking warfarin and comes in today for a routine follow up visit.  Positive risk factors for bleeding include history of GI bleeding.  Negative risk factors for bleeding include an age less than 14 years old.  The bleeding index is 'intermediate risk'.  Positive CHADS2 values include History of HTN.  Negative CHADS2 values include Age > 81 years old.  Her last INR was 3.4 ratio.  Anticoagulation responsible provider: Shirlee Latch MD, [place].  INR POC: 2.2.  Cuvette Lot#: [id number].  Exp: 04/01/2011.    Anticoagulation Management Assessment/Plan:  The patient's current anticoagulation dose is Coumadin 5 mg tabs: use asd - 1 by mouth once daily.  The next INR is due 04/06/2010.  Results were reviewed/authorized by Leota Sauers, PharmD, BCPS, CPP.         Prior Anticoagulation Instructions: INR 2.3 Continue taking 1 tablet everyday. Recheck in 1 week.   Current Anticoagulation Instructions: INR 2.2  Coumadin 5mg , 1 tab each day

## 2010-06-02 NOTE — Assessment & Plan Note (Signed)
Summary: 1 MTH PHYSICAL  STC   Vital Signs:  Patient profile:   65 year old female Height:      65.5 inches Weight:      222.25 pounds BMI:     36.55 O2 Sat:      94 % on Room air Temp:     97.9 degrees F oral Pulse rate:   75 / minute BP sitting:   132 / 90  (left arm) Cuff size:   large  Vitals Entered ByZella Ball Ewing (Sep 20, 2009 1:19 PM)  O2 Flow:  Room air  CC: Adult Physical/Re   Primary Care Provider:  Oliver Barre MD  CC:  Adult Physical/Re.  History of Present Illness: seeing [facility] - recevied order for steroid to be done locally - first one in march seemed to work well but now pain returned;  overall doing well;  Pt denies CP, sob, doe, wheezing, orthopnea, pnd, worsening LE edema, palps, dizziness or syncope   Pt denies new neuro symptoms such as headache, facial or extremity weakness   Problems Prior to Update: 1)  Menopausal Disorder  (ICD-627.9) 2)  Hip Pain, Right, Chronic  (ICD-719.45) 3)  Leg Cramps  (ICD-729.82) 4)  Skin Lesion  (ICD-709.9) 5)  Other Nonspecific Findings Examination of Blood  (ICD-790.99) 6)  Preventive Health Care  (ICD-V70.0) 7)  Insomnia-sleep Disorder-unspec  (ICD-780.52) 8)  Back Pain  (ICD-724.5) 9)  Leg Pain, Right  (ICD-729.5) 10)  Family Hx Colon Cancer  (ICD-V16.0) 11)  Rectal Bleeding  (ICD-569.3) 12)  Allergic Rhinitis  (ICD-477.9) 13)  Mass, Superficial  (ICD-782.2) 14)  Sciatica, Right  (ICD-724.3) 15)  Hematochezia  (ICD-578.1) 16)  Preventive Health Care  (ICD-V70.0) 17)  Hyperlipidemia  (ICD-272.4) 18)  Pulmonary Embolism, Hx of  (ICD-V12.51) 19)  Tmj Syndrome  (ICD-524.60) 20)  Hemorrhoids  (ICD-455.6) 21)  Gerd  (ICD-530.81) 22)  Hx, Personal, Tuberculosis  (ICD-V12.01) 23)  Hypertension  (ICD-401.9)  Medications Prior to Update: 1)  Omeprazole 20 Mg  Cpdr (Omeprazole) .... 2 Po Once Daily 2)  Benazepril Hcl 40 Mg Tabs (Benazepril Hcl) .Marland Kitchen.. 1 By Mouth Once Daily 3)  Adult Aspirin Ec Low Strength 81 Mg   Tbec (Aspirin) .Marland Kitchen.. 1po Qd 4)  Fluticasone Propionate 50 Mcg/act  Susp (Fluticasone Propionate) .... 2 Spray/side Qd 5)  Pravachol 20 Mg  Tabs (Pravastatin Sodium) .Marland Kitchen.. 1po Qd 6)  Lidoderm 5 % Ptch (Lidocaine) .... Use Asd Three Times A Day As Needed 7)  Zolpidem Tartrate 10 Mg Tabs (Zolpidem Tartrate) .Marland Kitchen.. 1 By Mouth At Bedtime As Needed Sleep 8)  Diclofenac Sodium 75 Mg Tbec (Diclofenac Sodium) .Marland Kitchen.. 1 By Mouth 2 Times A Day 9)  Flexeril 10 Mg Tabs (Cyclobenzaprine Hcl) .... 1/2 - 1 By Mouth At Bedtime As Needed  Current Medications (verified): 1)  Omeprazole 20 Mg  Cpdr (Omeprazole) .... 2 Po Once Daily 2)  Benazepril Hcl 40 Mg Tabs (Benazepril Hcl) .Marland Kitchen.. 1 By Mouth Once Daily 3)  Adult Aspirin Ec Low Strength 81 Mg  Tbec (Aspirin) .Marland Kitchen.. 1po Qd 4)  Fluticasone Propionate 50 Mcg/act  Susp (Fluticasone Propionate) .... 2 Spray/side Qd 5)  Pravachol 20 Mg  Tabs (Pravastatin Sodium) .Marland Kitchen.. 1po Qd 6)  Zolpidem Tartrate 10 Mg Tabs (Zolpidem Tartrate) .Marland Kitchen.. 1 By Mouth At Bedtime As Needed Sleep 7)  Diclofenac Sodium 75 Mg Tbec (Diclofenac Sodium) .Marland Kitchen.. 1 By Mouth 2 Times A Day 8)  Flexeril 10 Mg Tabs (Cyclobenzaprine Hcl) .... 1/2 - 1  By Mouth At Bedtime As Needed  Allergies (verified): No Known Drug Allergies  Past History:  Past Medical History: Last updated: 08/11/2009 hx of pos PPD -treated Hypertension GERD Hemorrhoids TMJ Syndrome Pulmonary Embolus Hyperlipidemia hx of MRSA Allergic rhinitis diverticulosis/left right hip DJD - severe mild left hip DJD  Past Surgical History: Last updated: 10/07/2007 Inguinal herniorrhaphy,multiple ventral hernia repairs/mesh Cholecystectomy Hysterectomy tumor removed off of right shoulder  Family History: Last updated: 09/12/2007 father with colon cancer mother with heart disease sister with DM, HTN, elev chol, heart disease  Social History: Last updated: 09/20/2009 Married 2 children Never Smoked 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 father with colon cancer mother with heart disease sister with DM, HTN, elev chol, heart disease  Social History: Reviewed history from 09/12/2007 and no changes required. 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         all otherwise negative per pt -    Physical Exam  General:  alert and overweight-appearing.   Head:  normocephalic and atraumatic.   Eyes:  vision grossly intact, pupils equal, and pupils round.   Ears:  R ear normal and L ear normal.   Nose:  no external deformity and no nasal discharge.   Mouth:  no gingival abnormalities and pharynx pink and moist.   Neck:  supple and no masses.   Lungs:  normal respiratory effort and normal breath sounds.   Heart:  normal rate and regular rhythm.   Abdomen:  soft, non-tender, and normal bowel sounds.   Msk:  no joint tenderness and no joint swelling.   Extremities:  no edema, no erythema  Neurologic:  cranial nerves II-XII intact, strength normal in all extremities, and DTRs symmetrical and normal.     Impression & Recommendations:  Problem # 1:  Preventive Health Care (ICD-V70.0)  Overall doing well, age appropriate education and counseling updated and referral for appropriate preventive services done unless declined, immunizations up to date or declined, diet  counseling done if overweight, urged to quit smoking if smokes , most recent labs reviewed and current ordered if appropriate, ecg reviewed or declined (interpretation per ECG scanned in the EMR if done); information regarding Medicare Prevention requirements given if appropriate; speciality referrals updated as appropriate   Orders: EKG w/ Interpretation (93000)  Problem # 2:  MENOPAUSAL DISORDER (ICD-627.9) due for dxa Orders: T-Bone Densitometry (81191)  Problem # 3:  HYPERTENSION (ICD-401.9)  Her updated medication list for this problem includes:    Benazepril Hcl 40 Mg Tabs (Benazepril hcl) .Marland Kitchen... 1 by mouth once daily  BP today: 132/90 Prior BP: 112/80 (08/11/2009)  Labs Reviewed: K+: 4.4 (09/16/2009) Creat: : 0.9 (09/16/2009)   Chol: 148 (09/16/2009)   HDL: 38.00 (09/16/2009)   LDL: 79 (09/16/2009)   TG: 155.0 (09/16/2009) stable overall by hx and exam, ok to continue meds/tx as is   Complete Medication List: 1)  Omeprazole 20 Mg Cpdr (Omeprazole) .... 2 po once daily 2)  Benazepril Hcl 40  Mg Tabs (Benazepril hcl) .Marland Kitchen.. 1 by mouth once daily 3)  Adult Aspirin Ec Low Strength 81 Mg Tbec (Aspirin) .Marland Kitchen.. 1po qd 4)  Fluticasone Propionate 50 Mcg/act Susp (Fluticasone propionate) .... 2 spray/side qd 5)  Pravachol 20 Mg Tabs (Pravastatin sodium) .Marland Kitchen.. 1po qd 6)  Zolpidem Tartrate 10 Mg Tabs (Zolpidem tartrate) .Marland Kitchen.. 1 by mouth at bedtime as needed sleep 7)  Diclofenac Sodium 75 Mg Tbec (Diclofenac sodium) .Marland Kitchen.. 1 by mouth 2 times a day 8)  Flexeril 10 Mg Tabs (Cyclobenzaprine hcl) .... 1/2 - 1 by mouth at bedtime as needed  Patient Instructions: 1)  please schedule the bone density before leaving today 2)  Continue all previous medications as before this visit 3)  Please schedule a follow-up appointment in 1 year or sooner if needed Prescriptions: OMEPRAZOLE 20 MG  CPDR (OMEPRAZOLE) 2 po once daily  #60 x 11   Entered and Authorized by:   Corwin Levins MD   Signed by:   Corwin Levins MD on 09/20/2009   Method used:   Print then Give to Patient   RxID:   [id number]

## 2010-06-02 NOTE — Progress Notes (Signed)
Summary: Rx req  Phone Note Call from Patient Call back at Home Phone [phone]   Caller: Patient Summary of Call: Pt called stating that she discussed with MD at last OV or possibly OV before frequent urination. Pt says that MD was going to give her Rx for Vesicare? Pt did not get Rx is now requesting medication to [facility] on [address]. Initial call taken by: Margaret Pyle, CMA,  November 11, 2009 1:15 PM  Follow-up for Phone Call        ok to try - vesicare 5 mg per day - to robin to handle Follow-up by: Corwin Levins MD,  November 11, 2009 1:18 PM    New/Updated Medications: VESICARE 5 MG TABS (SOLIFENACIN SUCCINATE) 1 by mouth once daily Prescriptions: VESICARE 5 MG TABS (SOLIFENACIN SUCCINATE) 1 by mouth once daily  #30 x 11   Entered by:   Zella Ball Ewing CMA (AAMA)   Authorized by:   Corwin Levins MD   Signed by:   Scharlene Gloss CMA (AAMA) on 11/11/2009   Method used:   Electronically to        [facility] (retail)       [address]       [place], [place]  [address]       Ph: [phone]       Fax: [phone]   RxID:   [id number]

## 2010-06-02 NOTE — Medication Information (Signed)
Summary: rov coumadin - [facility]  Anticoagulant Therapy  Managed by: Leota Sauers, PharmD, BCPS, CPP Referring MD: Dr. Oliver Barre PCP: Oliver Barre MD Supervising MD: Ladona Ridgel MD, Sharlot Gowda Indication 1: DVT/PE (recurrent or continuing risk factors) Lab Used: [facility] [place] Site: [address] INR POC 2.1 INR RANGE 2.0-3.0  Dietary changes: no    Health status changes: no    Bleeding/hemorrhagic complications: no    Recent/future hospitalizations: no    Any changes in medication regimen? no    Recent/future dental: no  Any missed doses?: no       Is patient compliant with meds? yes       Current Medications (verified): 1)  Omeprazole 20 Mg  Cpdr (Omeprazole) .... 2 Po Once Daily 2)  Benazepril Hcl 40 Mg Tabs (Benazepril Hcl) .Marland Kitchen.. 1 By Mouth Once Daily 3)  Adult Aspirin Ec Low Strength 81 Mg  Tbec (Aspirin) .Marland Kitchen.. 1po Qd 4)  Fluticasone Propionate 50 Mcg/act  Susp (Fluticasone Propionate) .... 2 Spray/side Qd 5)  Pravachol 20 Mg  Tabs (Pravastatin Sodium) .Marland Kitchen.. 1po Qd 6)  Zolpidem Tartrate 10 Mg Tabs (Zolpidem Tartrate) .Marland Kitchen.. 1 By Mouth At Bedtime As Needed Sleep 7)  Coumadin 5 Mg Tabs (Warfarin Sodium) .... Use Asd - 1 By Mouth Once Daily 8)  B Complex  Tabs (B Complex Vitamins) .... Take 1 Tablet Daily.  Allergies (verified): No Known Drug Allergies  Anticoagulation Management History:      The patient is taking warfarin and comes in today for a routine follow up visit.  Positive risk factors for bleeding include history of GI bleeding.  Negative risk factors for bleeding include an age less than 69 years old.  The bleeding index is 'intermediate risk'.  Positive CHADS2 values include History of HTN.  Negative CHADS2 values include Age > 45 years old.  Her last INR was 3.4 ratio.  Anticoagulation responsible provider: Ladona Ridgel MD, Sharlot Gowda.  INR POC: 2.1.  Cuvette Lot#: [id number].  Exp: 04/01/2011.    Anticoagulation Management Assessment/Plan:      The patient's  current anticoagulation dose is Coumadin 5 mg tabs: use asd - 1 by mouth once daily.  The next INR is due 05/04/2010.  Results were reviewed/authorized by Leota Sauers, PharmD, BCPS, CPP.         Prior Anticoagulation Instructions: INR 2.2  Coumadin 5mg , 1 tab each day  Current Anticoagulation Instructions: INR 2.1  Couamdin 5mg  tabs - take 1 tab each day

## 2010-06-02 NOTE — Miscellaneous (Signed)
Summary: BONE DENSITY  Clinical Lists Changes  Orders: Added new Test order of T-Lumbar Vertebral Assessment (77082) - Signed 

## 2010-06-02 NOTE — Medication Information (Signed)
Summary: rov/nb   Anticoagulant Therapy  Managed by: Lyna Poser, PharmD PCP: Oliver Barre MD Supervising MD: Jens Som MD, Arlys John Indication 1: DVT/PE (recurrent or continuing risk factors) Lab Used: [facility] [place] Site: [address] INR POC 2.3 INR RANGE 2.0-3.0  Dietary changes: no    Health status changes: no    Bleeding/hemorrhagic complications: no    Recent/future hospitalizations: no    Any changes in medication regimen? no    Recent/future dental: no  Any missed doses?: no       Is patient compliant with meds? yes       Allergies: No Known Drug Allergies  Anticoagulation Management History:      The patient is taking warfarin and comes in today for a routine follow up visit.  Positive risk factors for bleeding include history of GI bleeding.  Negative risk factors for bleeding include an age less than 27 years old.  The bleeding index is 'intermediate risk'.  Positive CHADS2 values include History of HTN.  Negative CHADS2 values include Age > 67 years old.  Her last INR was 3.4 ratio.  Anticoagulation responsible provider: Jens Som MD, Arlys John.  INR POC: 2.3.  Cuvette Lot#: [id number].  Exp: 04/01/2011.    Anticoagulation Management Assessment/Plan:      The patient's current anticoagulation dose is Coumadin 5 mg tabs: use asd - 1 by mouth once daily.  The next INR is due 03/23/2010.  Results were reviewed/authorized by Lyna Poser, PharmD.         Prior Anticoagulation Instructions: INR 2.8  Continue taking Coumadin 1 tab (5 mg) every day. Return to clinic in 1 week.   Current Anticoagulation Instructions: INR 2.3 Continue taking 1 tablet everyday. Recheck in 1 week.

## 2010-06-02 NOTE — Assessment & Plan Note (Signed)
Summary: "knot" left side ribs/#/cd   Vital Signs:  Patient profile:   65 year old female Height:      65 inches Weight:      228.25 pounds BMI:     38.12 O2 Sat:      97 % on Room air Temp:     97 degrees F oral Pulse rate:   76 / minute BP sitting:   132 / 82  (left arm) Cuff size:   large  Vitals Entered ByMarland Kitchen Zella Ball Ewing (July 20, 2009 11:22 AM)  O2 Flow:  Room air  CC: Knot on left side, refill/RE   Primary Care Provider:  Oliver Barre MD  CC:  Knot on left side and refill/RE.  History of Present Illness: overall doing ok except for a lump to the left side below the rib cage that has seemed to have enlarged int the past 2wks, and seems somewhat tender, expecially with more trying to feel it.  Has known lump in the area for several years, no obvious abscess or drainage with her hx of MRSA , no fever, no other simlar subq lumps anywhere else she can tell, and no recent wt los, fever, night sweats, abd pain or bowel change.  Has had recent colonoscpy - due for f/u 2014.  Also with hx of lipoma removed several years ago but seems more firm than the lipoma.    also requests refill zolpidem as she has persistent trouble getting to sleep nightly. husband still in [place] but hopeful to come home soon.  denies worsening depressive symptoms or suicidal ideation, or increased stress, anxiety, panic.  Overall good compliance with meds, and good tolerability.  Pt denies CP, sob, doe, wheezing, orthopnea, pnd, worsening LE edema, palps, dizziness or syncope   Pt denies new neuro symptoms such as headache, facial or extremity weakness   Also due for min invasive right hip surgury next month at [facility] for severe DJD  Problems Prior to Update: 1)  Other Nonspecific Findings Examination of Blood  (ICD-790.99) 2)  Preventive Health Care  (ICD-V70.0) 3)  Insomnia-sleep Disorder-unspec  (ICD-780.52) 4)  Back Pain  (ICD-724.5) 5)  Leg Pain, Right  (ICD-729.5) 6)  Family Hx Colon Cancer   (ICD-V16.0) 7)  Rectal Bleeding  (ICD-569.3) 8)  Allergic Rhinitis  (ICD-477.9) 9)  Mass, Superficial  (ICD-782.2) 10)  Sciatica, Right  (ICD-724.3) 11)  Hematochezia  (ICD-578.1) 12)  Preventive Health Care  (ICD-V70.0) 13)  Hyperlipidemia  (ICD-272.4) 14)  Pulmonary Embolism, Hx of  (ICD-V12.51) 15)  Tmj Syndrome  (ICD-524.60) 16)  Hemorrhoids  (ICD-455.6) 17)  Gerd  (ICD-530.81) 18)  Hx, Personal, Tuberculosis  (ICD-V12.01) 19)  Hypertension  (ICD-401.9)  Medications Prior to Update: 1)  Omeprazole 20 Mg  Cpdr (Omeprazole) .... 2 Po Once Daily 2)  Benazepril Hcl 40 Mg Tabs (Benazepril Hcl) .Marland Kitchen.. 1 By Mouth Once Daily 3)  Adult Aspirin Ec Low Strength 81 Mg  Tbec (Aspirin) .Marland Kitchen.. 1po Qd 4)  Fluticasone Propionate 50 Mcg/act  Susp (Fluticasone Propionate) .... 2 Spray/side Qd 5)  Pravachol 20 Mg  Tabs (Pravastatin Sodium) .Marland Kitchen.. 1po Qd 6)  Celebrex 200 Mg Caps (Celecoxib) .... Take 1 Tablet By Mouth Once A Day As Needed 7)  Darvocet-N 100 100-650 Mg Tabs (Propoxyphene N-Apap) .Marland Kitchen.. 1 By Mouth Four Times Per Day As Needed For Breakthrough Pain 8)  Lidoderm 5 % Ptch (Lidocaine) .... Use Asd Three Times A Day As Needed 9)  Zolpidem Tartrate 10 Mg Tabs (Zolpidem Tartrate) .Marland KitchenMarland KitchenMarland Kitchen  1 By Mouth At Bedtime As Needed Sleep 10)  Diclofenac Sodium 75 Mg Tbec (Diclofenac Sodium) .Marland Kitchen.. 1 By Mouth 2 Times A Day  Current Medications (verified): 1)  Omeprazole 20 Mg  Cpdr (Omeprazole) .... 2 Po Once Daily 2)  Benazepril Hcl 40 Mg Tabs (Benazepril Hcl) .Marland Kitchen.. 1 By Mouth Once Daily 3)  Adult Aspirin Ec Low Strength 81 Mg  Tbec (Aspirin) .Marland Kitchen.. 1po Qd 4)  Fluticasone Propionate 50 Mcg/act  Susp (Fluticasone Propionate) .... 2 Spray/side Qd 5)  Pravachol 20 Mg  Tabs (Pravastatin Sodium) .Marland Kitchen.. 1po Qd 6)  Celebrex 200 Mg Caps (Celecoxib) .... Take 1 Tablet By Mouth Once A Day As Needed 7)  Darvocet-N 100 100-650 Mg Tabs (Propoxyphene N-Apap) .Marland Kitchen.. 1 By Mouth Four Times Per Day As Needed For Breakthrough Pain 8)   Lidoderm 5 % Ptch (Lidocaine) .... Use Asd Three Times A Day As Needed 9)  Zolpidem Tartrate 10 Mg Tabs (Zolpidem Tartrate) .Marland Kitchen.. 1 By Mouth At Bedtime As Needed Sleep 10)  Diclofenac Sodium 75 Mg Tbec (Diclofenac Sodium) .Marland Kitchen.. 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   Head:  normocephalic and atraumatic.   Eyes:  vision grossly intact, pupils equal, and pupils round.   Ears:  R ear normal and L ear normal.   Nose:  no external deformity and no nasal discharge.   Mouth:  no gingival abnormalities and pharynx pink and moist.   Neck:  supple and no masses.   Lungs:  normal respiratory effort and normal breath sounds.   Heart:  normal rate and regular rhythm.   Extremities:  no edema, no erythema  Skin:  color normal and no rashes.  ; left side in ant axilary line below the 10th rib area is discrete but vague firm mass approx just over 1 cm, with mild tender but not fluctuant, no overlying skin change , no drainage or erythema Psych:  not depressed appearing and slightly anxious.     Impression & Recommendations:  Problem # 1:  SKIN LESION (ICD-709.9) prob benign mass, per pt enlarging and I offered to refer to gen surg for removal but she  declines at this time  Problem # 2:  INSOMNIA-SLEEP DISORDER-UNSPEC (ICD-780.52)  Her updated medication list for this problem includes:    Zolpidem Tartrate 10 Mg Tabs (Zolpidem tartrate) .Marland Kitchen... 1 by mouth at bedtime as needed sleep stable overall by hx and exam, ok to continue meds/tx as is - med refilled  Problem # 3:  HYPERTENSION (ICD-401.9)  Her updated medication list for this problem includes:    Benazepril Hcl 40 Mg Tabs (Benazepril hcl) .Marland Kitchen... 1 by mouth once daily  BP today: 132/82 Prior BP: 142/90 (09/15/2008)  Labs Reviewed: K+: 4.1 (09/10/2008) Creat: : 1.0 (09/10/2008)   Chol: 164 (09/10/2008)   HDL: 39.60 (09/10/2008)  LDL: 98 (09/10/2008)   TG: 130.0 (09/10/2008) stable overall by hx and exam, ok to continue meds/tx as is   Complete Medication List: 1)  Omeprazole 20 Mg Cpdr (Omeprazole) .... 2 po once daily 2)  Benazepril Hcl 40 Mg Tabs (Benazepril hcl) .Marland Kitchen.. 1 by mouth once daily 3)  Adult Aspirin Ec Low Strength 81 Mg Tbec (Aspirin) .Marland Kitchen.. 1po qd 4)  Fluticasone Propionate 50 Mcg/act Susp (Fluticasone propionate) .... 2 spray/side qd 5)  Pravachol 20 Mg Tabs (Pravastatin sodium) .Marland Kitchen.. 1po qd 6)  Celebrex 200 Mg Caps (Celecoxib) .... Take 1 tablet by mouth once a day as needed 7)  Darvocet-n 100 100-650 Mg Tabs (Propoxyphene n-apap) .Marland Kitchen.. 1 by mouth four times per day as needed for breakthrough pain 8)  Lidoderm 5 % Ptch (Lidocaine) .... Use asd three times a day as needed 9)  Zolpidem Tartrate 10 Mg Tabs (Zolpidem tartrate) .Marland Kitchen.. 1 by mouth at bedtime as needed sleep 10)  Diclofenac Sodium 75 Mg Tbec (Diclofenac sodium) .Marland Kitchen.. 1 by mouth 2 times a day  Patient Instructions: 1)  Continue all previous medications as before this visit  2)  good luck with your right hip surgury 3)  You are given the zolpidem refill today 4)  call if you would like the referral to Gen Surgury to have the lump removed 5)  Please schedule a follow-up appointment in 3 months with CPX  labs Prescriptions: ZOLPIDEM TARTRATE 10 MG TABS (ZOLPIDEM TARTRATE) 1 by mouth at bedtime as needed sleep  #30 x 5   Entered and Authorized by:   Corwin Levins MD   Signed by:   Corwin Levins MD on 07/20/2009   Method used:   Print then Give to Patient   RxID:   [id number]

## 2010-06-02 NOTE — Letter (Signed)
Summary: Primary Care Appointment Letter  [facility]  [address] [place], [place] [address]   Phone: [phone]  Fax: [phone]    03/23/2010 MRN: [id number]  [facility] [address] [place], [place]  [address]  Dear Ms. DISLA,   Your Primary Care Physician Corwin Levins MD has indicated that:     __X__it is time to schedule an appointment in May 2012 with Dr. Jonny Ruiz for a Physical with fasting labs prior to the appointment.  Please call the office.    _______you missed your appointment on______ and need to call and          reschedule.    _______you need to have lab work done.    _______you need to schedule an appointment discuss lab or test results.    _______you need to call to reschedule your appointment that is                       scheduled on _________.     Please call our office as soon as possible. Our phone number is [phone]. Please press option 1. Our office is open 8a-12noon and 1p-5p, Monday through Friday.     Thank you,    [facility] Scheduler

## 2010-06-02 NOTE — Miscellaneous (Signed)
Summary: Orders Update   Clinical Lists Changes  Problems: Added new problem of LONG-TERM (CURRENT) USE OF ANTICOAGULANTS (ICD-V58.61) Orders: Added new Referral order of Misc. Referral (Misc. Ref) - Signed

## 2010-06-02 NOTE — Assessment & Plan Note (Signed)
Summary: feet cramps/cd   Vital Signs:  Patient profile:   65 year old female Height:      65.5 inches Weight:      221.25 pounds BMI:     36.39 O2 Sat:      98 % on Room air Temp:     97.7 degrees F oral Pulse rate:   110 / minute BP sitting:   116 / 82  (left arm) Cuff size:   large  Vitals Entered ByZella Ball Ewing (October 07, 2009 4:27 PM)  O2 Flow:  Room air CC: Feet and leg cramps for 3 days/RE   Primary Care Provider:  Oliver Barre MD  CC:  Feet and leg cramps for 3 days/RE.  History of Present Illness: here to f/u  - unfortunatley for some reason c/o 2-3 days  increased freq and severity of cramps to the feet several times per day with great toes drawing up,, often at night as well, and also with a severe cramp incidently last night to the left upper abd area; also with cramps in the last wk to the left thigh as well;  is recently s/p steroid injection to the right hip with marked improvement in pain, and recently with increased walking trying to be more active and lose wt;  states she can spend hours at the grocery to make sure she gets the best deals;    also with increased urination frequency for no obvious reason;  denies pain, urgency, hematuria, ongoing now for over 2 months;  no recent fever, wt loss, night sweats or other constitutional symptoms.  No back pain, n/v , chills or high fever  Pt denies CP, sob, doe, wheezing, orthopnea, pnd, worsening LE edema, palps, dizziness or syncope   Pt denies new neuro symptoms such as headache, facial or extremity weakness   Problems Prior to Update: 1)  Frequency, Urinary  (ICD-788.41) 2)  Muscle Cramps  (ICD-729.82) 3)  Menopausal Disorder  (ICD-627.9) 4)  Hip Pain, Right, Chronic  (ICD-719.45) 5)  Leg Cramps  (ICD-729.82) 6)  Skin Lesion  (ICD-709.9) 7)  Other Nonspecific Findings Examination of Blood  (ICD-790.99) 8)  Preventive Health Care  (ICD-V70.0) 9)  Insomnia-sleep Disorder-unspec  (ICD-780.52) 10)  Back Pain   (ICD-724.5) 11)  Leg Pain, Right  (ICD-729.5) 12)  Family Hx Colon Cancer  (ICD-V16.0) 13)  Rectal Bleeding  (ICD-569.3) 14)  Allergic Rhinitis  (ICD-477.9) 15)  Mass, Superficial  (ICD-782.2) 16)  Sciatica, Right  (ICD-724.3) 17)  Hematochezia  (ICD-578.1) 18)  Preventive Health Care  (ICD-V70.0) 19)  Hyperlipidemia  (ICD-272.4) 20)  Pulmonary Embolism, Hx of  (ICD-V12.51) 21)  Tmj Syndrome  (ICD-524.60) 22)  Hemorrhoids  (ICD-455.6) 23)  Gerd  (ICD-530.81) 24)  Hx, Personal, Tuberculosis  (ICD-V12.01) 25)  Hypertension  (ICD-401.9)  Medications Prior to Update: 1)  Omeprazole 20 Mg  Cpdr (Omeprazole) .... 2 Po Once Daily 2)  Benazepril Hcl 40 Mg Tabs (Benazepril Hcl) .Marland Kitchen.. 1 By Mouth Once Daily 3)  Adult Aspirin Ec Low Strength 81 Mg  Tbec (Aspirin) .Marland Kitchen.. 1po Qd 4)  Fluticasone Propionate 50 Mcg/act  Susp (Fluticasone Propionate) .... 2 Spray/side Qd 5)  Pravachol 20 Mg  Tabs (Pravastatin Sodium) .Marland Kitchen.. 1po Qd 6)  Zolpidem Tartrate 10 Mg Tabs (Zolpidem Tartrate) .Marland Kitchen.. 1 By Mouth At Bedtime As Needed Sleep 7)  Diclofenac Sodium 75 Mg Tbec (Diclofenac Sodium) .Marland Kitchen.. 1 By Mouth 2 Times A Day 8)  Flexeril 10 Mg Tabs (Cyclobenzaprine Hcl) .... 1/2 -  1 By Mouth At Bedtime As Needed  Current Medications (verified): 1)  Omeprazole 20 Mg  Cpdr (Omeprazole) .... 2 Po Once Daily 2)  Benazepril Hcl 40 Mg Tabs (Benazepril Hcl) .Marland Kitchen.. 1 By Mouth Once Daily 3)  Adult Aspirin Ec Low Strength 81 Mg  Tbec (Aspirin) .Marland Kitchen.. 1po Qd 4)  Fluticasone Propionate 50 Mcg/act  Susp (Fluticasone Propionate) .... 2 Spray/side Qd 5)  Pravachol 20 Mg  Tabs (Pravastatin Sodium) .Marland Kitchen.. 1po Qd 6)  Zolpidem Tartrate 10 Mg Tabs (Zolpidem Tartrate) .Marland Kitchen.. 1 By Mouth At Bedtime As Needed Sleep 7)  Diclofenac Sodium 75 Mg Tbec (Diclofenac Sodium) .Marland Kitchen.. 1 By Mouth 2 Times A Day 8)  Robaxin-750 750 Mg Tabs (Methocarbamol) .Marland Kitchen.. 1 By Mouth Two Times A Day As Needed 9)  Diazepam 5 Mg Tabs (Diazepam) .Marland Kitchen.. 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   Head:  normocephalic and atraumatic.   Eyes:  vision grossly intact, pupils equal, and pupils round.   Ears:  R ear normal and L ear normal.   Nose:  no external deformity and no nasal discharge.   Mouth:  no gingival abnormalities and pharynx pink and moist.   Neck:  supple and no masses.   Lungs:  normal respiratory effort and normal breath sounds.   Heart:  normal rate and regular rhythm.   Abdomen:  soft, non-tender, and normal bowel sounds.   Msk:  no joint tenderness and no joint swelling.   Extremities:  no edema, no erythema    Impression & Recommendations:  Problem # 1:  MUSCLE CRAMPS (ICD-729.82) likely due to increased activity , recent labs reviewed;  treat as above per EMR, f/u any worsening signs or symptoms   Problem # 2:  FREQUENCY, URINARY (ICD-788.41)  Orders: T-Culture, Urine ([id number]) TLB-Udip w/ Micro (81001-URINE)  seems c/w OAB, to try vesicare 5, check urine studies to r/o infection or blood; consider urology for persistent or worsening symtpoms  Problem # 3:  HYPERTENSION (ICD-401.9)  Her updated medication list for this problem includes:    Benazepril Hcl  40 Mg Tabs (Benazepril hcl) .Marland Kitchen... 1 by mouth once daily  BP today: 116/82 Prior BP: 132/90 (09/20/2009)  Labs Reviewed: K+: 4.4 (09/16/2009) Creat: : 0.9 (09/16/2009)   Chol: 148 (09/16/2009)   HDL: 38.00 (09/16/2009)   LDL: 79 (09/16/2009)   TG: 155.0 (09/16/2009) stable overall by hx and exam, ok to continue meds/tx as is   Complete Medication List: 1)  Omeprazole 20 Mg Cpdr (Omeprazole) .... 2 po once daily 2)  Benazepril Hcl 40 Mg Tabs (Benazepril hcl) .Marland Kitchen.. 1 by mouth once daily 3)  Adult Aspirin Ec Low Strength 81 Mg Tbec (Aspirin) .Marland Kitchen.. 1po qd 4)  Fluticasone Propionate 50 Mcg/act Susp (Fluticasone propionate) .... 2 spray/side qd 5)  Pravachol 20  Mg Tabs (Pravastatin sodium) .Marland Kitchen.. 1po qd 6)  Zolpidem Tartrate 10 Mg Tabs (Zolpidem tartrate) .Marland Kitchen.. 1 by mouth at bedtime as needed sleep 7)  Diclofenac Sodium 75 Mg Tbec (Diclofenac sodium) .Marland Kitchen.. 1 by mouth 2 times a day 8)  Robaxin-750 750 Mg Tabs (Methocarbamol) .Marland Kitchen.. 1 by mouth two times a day as needed 9)  Diazepam 5 Mg Tabs (Diazepam) .Marland Kitchen.. 1po two times a day  Patient Instructions: 1)  Please take all new medications as prescribed;  you can start the vesicare at 5 mg per day (samples  -  remember it takes up to 4 wks for the full effect of the vesicare) 2)  Continue all previous medications as before this visit  3)  Please go to the Lab in the basement for your urine tests today  4)  Please schedule a follow-up appointment as needed. Prescriptions: DIAZEPAM 5 MG TABS (DIAZEPAM) 1po two times a day  #60 x 0   Entered and Authorized by:   Corwin Levins MD   Signed by:   Corwin Levins MD on 10/07/2009   Method used:   Print then Give to Patient   RxID:   [id number] DIAZEPAM 5 MG TABS (DIAZEPAM) 1po two times a day  #06 x 0   Entered and Authorized by:   Corwin Levins MD   Signed by:   Corwin Levins MD on 10/07/2009   Method used:   Print then Give to Patient   RxID:   [id number] ROBAXIN-750 750 MG TABS (METHOCARBAMOL) 1 by  mouth two times a day as needed  #60 x 0   Entered and Authorized by:   Corwin Levins MD   Signed by:   Corwin Levins MD on 10/07/2009   Method used:   Print then Give to Patient   RxID:   [id number]

## 2010-06-02 NOTE — Assessment & Plan Note (Signed)
Summary: FEET,LEG CRAMPS/CD   Vital Signs:  Patient profile:   65 year old female Height:      65 inches Weight:      220.50 pounds BMI:     36.83 O2 Sat:      97 % on Room air Temp:     98.2 degrees F oral Pulse rate:   87 / minute BP sitting:   112 / 80  (left arm) Cuff size:   large  Vitals Entered ByZella Ball Ewing (August 11, 2009 2:50 PM)  O2 Flow:  Room air  CC: Leg and foot cramps/RE   Primary Care Provider:  Oliver Barre MD  CC:  Leg and foot cramps/RE.  History of Present Illness: worried about  DVT , so here to discuss onset of bilat leg cramps ongoing for months at low freq and severity at night only, but now quite severe over the last 2 wks, and expecially worse since Mon earlier this week she got a cortisone shot to the right hip;  was not walking much before the shot, and much more walking as her pain is wonderfully improved ;  she is still trying to hold off on the right hip replacement surgury, this was her first cortisone, and plans to cont this if possible rather than the surgury if she continues to have such good success.  No pain to calves with exertion or other rest pain at night.  ;  also mentions BP at [employer] much improved on current med with increased ace;  Pt denies CP, sob, doe, wheezing, orthopnea, pnd, worsening LE edema, palps, dizziness or syncope  Pt denies new neuro symptoms such as headache, facial or extremity weakness  .  Not orthostatic, dizzy o/w or recent falls or injury.    Problems Prior to Update: 1)  Leg Cramps  (ICD-729.82) 2)  Skin Lesion  (ICD-709.9) 3)  Other Nonspecific Findings Examination of Blood  (ICD-790.99) 4)  Preventive Health Care  (ICD-V70.0) 5)  Insomnia-sleep Disorder-unspec  (ICD-780.52) 6)  Back Pain  (ICD-724.5) 7)  Leg Pain, Right  (ICD-729.5) 8)  Family Hx Colon Cancer  (ICD-V16.0) 9)  Rectal Bleeding  (ICD-569.3) 10)  Allergic Rhinitis  (ICD-477.9) 11)  Mass, Superficial  (ICD-782.2) 12)  Sciatica, Right   (ICD-724.3) 13)  Hematochezia  (ICD-578.1) 14)  Preventive Health Care  (ICD-V70.0) 15)  Hyperlipidemia  (ICD-272.4) 16)  Pulmonary Embolism, Hx of  (ICD-V12.51) 17)  Tmj Syndrome  (ICD-524.60) 18)  Hemorrhoids  (ICD-455.6) 19)  Gerd  (ICD-530.81) 20)  Hx, Personal, Tuberculosis  (ICD-V12.01) 21)  Hypertension  (ICD-401.9)  Medications Prior to Update: 1)  Omeprazole 20 Mg  Cpdr (Omeprazole) .... 2 Po Once Daily 2)  Benazepril Hcl 40 Mg Tabs (Benazepril Hcl) .Marland Kitchen.. 1 By Mouth Once Daily 3)  Adult Aspirin Ec Low Strength 81 Mg  Tbec (Aspirin) .Marland Kitchen.. 1po Qd 4)  Fluticasone Propionate 50 Mcg/act  Susp (Fluticasone Propionate) .... 2 Spray/side Qd 5)  Pravachol 20 Mg  Tabs (Pravastatin Sodium) .Marland Kitchen.. 1po Qd 6)  Celebrex 200 Mg Caps (Celecoxib) .... Take 1 Tablet By Mouth Once A Day As Needed 7)  Darvocet-N 100 100-650 Mg Tabs (Propoxyphene N-Apap) .Marland Kitchen.. 1 By Mouth Four Times Per Day As Needed For Breakthrough Pain 8)  Lidoderm 5 % Ptch (Lidocaine) .... Use Asd Three Times A Day As Needed 9)  Zolpidem Tartrate 10 Mg Tabs (Zolpidem Tartrate) .Marland Kitchen.. 1 By Mouth At Bedtime As Needed Sleep 10)  Diclofenac Sodium 75 Mg Tbec (Diclofenac  Sodium) .Marland Kitchen.. 1 By Mouth 2 Times A Day  Current Medications (verified): 1)  Omeprazole 20 Mg  Cpdr (Omeprazole) .... 2 Po Once Daily 2)  Benazepril Hcl 40 Mg Tabs (Benazepril Hcl) .Marland Kitchen.. 1 By Mouth Once Daily 3)  Adult Aspirin Ec Low Strength 81 Mg  Tbec (Aspirin) .Marland Kitchen.. 1po Qd 4)  Fluticasone Propionate 50 Mcg/act  Susp (Fluticasone Propionate) .... 2 Spray/side Qd 5)  Pravachol 20 Mg  Tabs (Pravastatin Sodium) .Marland Kitchen.. 1po Qd 6)  Celebrex 200 Mg Caps (Celecoxib) .... Take 1 Tablet By Mouth Once A Day As Needed 7)  Darvocet-N 100 100-650 Mg Tabs (Propoxyphene N-Apap) .Marland Kitchen.. 1 By Mouth Four Times Per Day As Needed For Breakthrough Pain 8)  Lidoderm 5 % Ptch (Lidocaine) .... Use Asd Three Times A Day As Needed 9)  Zolpidem Tartrate 10 Mg Tabs (Zolpidem Tartrate) .Marland Kitchen.. 1 By Mouth At  Bedtime As Needed Sleep 10)  Diclofenac Sodium 75 Mg Tbec (Diclofenac Sodium) .Marland Kitchen.. 1 By Mouth 2 Times A Day 11)  Flexeril 10 Mg Tabs (Cyclobenzaprine Hcl) .... 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   Head:  normocephalic and atraumatic.   Eyes:  vision grossly intact, pupils equal, and pupils round.   Ears:  R ear normal and L ear normal.   Nose:  no external deformity and no nasal discharge.   Mouth:  no gingival abnormalities and pharynx pink and moist.   Neck:  supple and no masses.   Lungs:  normal respiratory effort and normal breath sounds.   Heart:  normal rate and regular rhythm.   Msk:  no joint tenderness and no joint swelling.   Extremities:  no edema, no erythema  Neurologic:  strength normal in all extremities and gait normal.   Psych:  not anxious appearing and not depressed appearing.     Impression & Recommendations:  Problem # 1:  LEG CRAMPS (ICD-729.82) nocturnal as above only; suspect related to deconditioning due to decreased ambulation due to right hip pain, but exacerbated by increased activity with improved right hip pain recently, for b complex MVI, and flexeril at bedtime as needed   Problem # 2:   HYPERTENSION (ICD-401.9)  Her updated medication list for this problem includes:    Benazepril Hcl 40 Mg Tabs (Benazepril hcl) .Marland Kitchen... 1 by mouth once daily  BP today: 112/80 Prior BP: 132/82 (07/20/2009)  Labs Reviewed: K+: 4.1 (09/10/2008) Creat: : 1.0 (09/10/2008)   Chol: 164 (09/10/2008)   HDL: 39.60 (09/10/2008)   LDL: 98 (09/10/2008)   TG: 130.0 (09/10/2008) stable overall by hx and exam, ok to continue meds/tx as is   Problem # 3:  HIP PAIN, RIGHT, CHRONIC (ICD-719.45)  The following medications were removed from the medication list:    Celebrex 200 Mg Caps (Celecoxib) .Marland Kitchen... Take 1 tablet  by mouth once a day as needed    Darvocet-n 100 100-650 Mg Tabs (Propoxyphene n-apap) .Marland Kitchen... 1 by mouth four times per day as needed for breakthrough pain Her updated medication list for this problem includes:    Adult Aspirin Ec Low Strength 81 Mg Tbec (Aspirin) .Marland Kitchen... 1po qd    Diclofenac Sodium 75 Mg Tbec (Diclofenac sodium) .Marland Kitchen... 1 by mouth 2 times a day    Flexeril 10 Mg Tabs (Cyclobenzaprine hcl) .Marland Kitchen... 1/2 - 1 by mouth at bedtime as needed treat as above, f/u any worsening signs or symptoms, f/u ortho as needed   Complete Medication List: 1)  Omeprazole 20 Mg Cpdr (Omeprazole) .... 2 po once daily 2)  Benazepril Hcl 40 Mg Tabs (Benazepril hcl) .Marland Kitchen.. 1 by mouth once daily 3)  Adult Aspirin Ec Low Strength 81 Mg Tbec (Aspirin) .Marland Kitchen.. 1po qd 4)  Fluticasone Propionate 50 Mcg/act Susp (Fluticasone propionate) .... 2 spray/side qd 5)  Pravachol 20 Mg Tabs (Pravastatin sodium) .Marland Kitchen.. 1po qd 6)  Lidoderm 5 % Ptch (Lidocaine) .... Use asd three times a day as needed 7)  Zolpidem Tartrate 10 Mg Tabs (Zolpidem tartrate) .Marland Kitchen.. 1 by mouth at bedtime as needed sleep 8)  Diclofenac Sodium 75 Mg Tbec (Diclofenac sodium) .Marland Kitchen.. 1 by mouth 2 times a day 9)  Flexeril 10 Mg Tabs (Cyclobenzaprine hcl) .... 1/2 - 1 by mouth at bedtime as needed  Patient Instructions: 1)  please start B complex vitamin - 1 per  day 2)  Please take all new medications as prescribed - the muscle relaxer as needed 3)  Continue all previous medications as before this visit  4)  Please schedule a follow-up appointment in 1 month with CPX labs Prescriptions: FLEXERIL 10 MG TABS (CYCLOBENZAPRINE HCL) 1/2 - 1 by mouth at bedtime as needed  #30 x 2   Entered and Authorized by:   Corwin Levins MD   Signed by:   Corwin Levins MD on 08/11/2009   Method used:   Print then Give to Patient   RxID:   [id number]

## 2010-06-02 NOTE — Miscellaneous (Signed)
Summary: [facility]   Imported By: Lester Baxter 07/30/2009 08:03:10  _____________________________________________________________________  External Attachment:    Type:   Image     Comment:   External Document

## 2010-06-02 NOTE — Medication Information (Signed)
Summary: ROV/SP   Anticoagulant Therapy  Managed by: Weston Brass, PharmD Referring MD: Dr. Oliver Barre PCP: Oliver Barre MD Supervising MD: Eden Emms MD, Theron Arista Indication 1: DVT/PE (recurrent or continuing risk factors) Lab Used: [facility] [place] Site: [address] INR POC 1.8 INR RANGE 2.0-3.0  Dietary changes: no    Health status changes: no    Bleeding/hemorrhagic complications: no    Recent/future hospitalizations: no    Any changes in medication regimen? no    Recent/future dental: no  Any missed doses?: no       Is patient compliant with meds? yes       Allergies: No Known Drug Allergies  Anticoagulation Management History:      The patient is taking warfarin and comes in today for a routine follow up visit.  Positive risk factors for bleeding include history of GI bleeding.  Negative risk factors for bleeding include an age less than 72 years old.  The bleeding index is 'intermediate risk'.  Positive CHADS2 values include History of HTN.  Negative CHADS2 values include Age > 27 years old.  Her last INR was 3.4 ratio.  Anticoagulation responsible provider: Eden Emms MD, Theron Arista.  INR POC: 1.8.  Cuvette Lot#: [id number].  Exp: 06/2011.    Anticoagulation Management Assessment/Plan:      The patient's current anticoagulation dose is Coumadin 5 mg tabs: use asd - 1 by mouth once daily.  The next INR is due 06/08/2010.  Results were reviewed/authorized by Weston Brass, PharmD.         Prior Anticoagulation Instructions: INR 1.6  Coumadin 5 mg tabs - Take extra half tablet today and tomorrow. Then continue 1 tablet daily.   Current Anticoagulation Instructions: INR 1.8 (INR goal: 2-3)  Take 1 tablet everyday except 1 and 1/2 tablets on Wednesdays.  Recheck in 2 weeks.

## 2010-06-02 NOTE — Medication Information (Signed)
Summary: CCN/ GD  Medications Added B COMPLEX  TABS (B COMPLEX VITAMINS) Take 1 tablet daily.       Anticoagulant Therapy  Managed by: Reina Fuse, PharmD PCP: Oliver Barre MD Supervising MD: Jens Som MD, Arlys John Indication 1: DVT/PE (recurrent or continuing risk factors) Lab Used: [facility] [place] Site: [address] INR POC 2.8 INR RANGE 2.0-3.0  Dietary changes: no    Health status changes: no    Bleeding/hemorrhagic complications: no    Recent/future hospitalizations: no    Any changes in medication regimen? no    Recent/future dental: no  Any missed doses?: no       Is patient compliant with meds? yes      Comments: Pt has been on Coumadin previously for DVT/PE. New DVT following hip replacement surgery (pt received Lovenox x 1 month, then had clot).New patient education completed. Pt expressed understanding of consistency in dietary vitamin K intake, s/sxs of bleeding, and reporting new medications to clinic.   Allergies: No Known Drug Allergies  Anticoagulation Management History:      The patient comes in today for her initial visit for anticoagulation therapy.  Positive risk factors for bleeding include history of GI bleeding.  Negative risk factors for bleeding include an age less than 60 years old.  The bleeding index is 'intermediate risk'.  Positive CHADS2 values include History of HTN.  Negative CHADS2 values include Age > 70 years old.  Her last INR was 3.4 ratio.  Anticoagulation responsible provider: Jens Som MD, Arlys John.  INR POC: 2.8.  Cuvette Lot#: [id number].    Anticoagulation Management Assessment/Plan:      The patient's current anticoagulation dose is Coumadin 5 mg tabs: use asd - 1 by mouth once daily.  The next INR is due 03/18/2010.  Results were reviewed/authorized by Reina Fuse, PharmD.  She was notified by Reina Fuse PharmD.         Current Anticoagulation Instructions: INR 2.8  Continue taking Coumadin 1 tab (5 mg) every  day. Return to clinic in 1 week.

## 2010-06-02 NOTE — Assessment & Plan Note (Signed)
Summary: flu shot/Lamond Glantz/cd   Nurse Visit   Allergies: No Known Drug Allergies  Orders Added: 1)  Admin 1st Vaccine [90471] 2)  Flu Vaccine 26yrs + [16109]       Flu Vaccine Consent Questions     Do you have a history of severe allergic reactions to this vaccine? no    Any prior history of allergic reactions to egg and/or gelatin? no    Do you have a sensitivity to the preservative Thimersol? no    Do you have a past history of Guillan-Barre Syndrome? no    Do you currently have an acute febrile illness? no    Have you ever had a severe reaction to latex? no    Vaccine information given and explained to patient? yes    Are you currently pregnant? no    Lot Number:[id number]   Exp Date:10/29/2010   Site Given  Left Deltoid IMu

## 2010-06-02 NOTE — Assessment & Plan Note (Signed)
Summary: POST HOSP/ POST ER/ ON COUMADIN/NWS  #   Vital Signs:  Patient profile:   65 year old female Height:      65 inches Weight:      219 pounds BMI:     36.58 O2 Sat:      99 % on Room air Temp:     98.5 degrees F oral Pulse rate:   75 / minute BP sitting:   110 / 80  (left arm) Cuff size:   large  Vitals Entered By: Zella Ball Ewing CMA Duncan Dull) (March 04, 2010 3:38 PM)  O2 Flow:  Room air CC: ER followup/Re   Primary Care Provider:  Oliver Barre MD  CC:  ER followup/Re.  History of Present Illness: here to f/u, after being seen and tx in ER oct 30 with DVT RLE;  pt has hx of PE (no DVT per pt) s/p 6 mo coumadin ;  place on daily lovenox 100 mg subcutaneously, and 5 mg coumadin to which she has been compliant and tolerating well;  no overt bleed or bruising;  was advised per ER to f/u with PCP nov1, but she is here today;  overall doing Ok, Pt denies CP, worsening sob, doe, wheezing, orthopnea, pnd, worsening LE edema, palps, dizziness or syncope  Has stll mild discomfort RLE with sweling but no redness and overall tenseness of the calf is much reduced.  No fever, wt loss, night sweats, loss of appetite or other constitutional symptoms  Pt denies new neuro symptoms such as headache, facial or extremity weakness  Bladder overall doing better with the vesicare;  no dysuria, fever, freq, urgency, hematuria, flank pain, n/v, chills.  Was recently on coumadin for 4 wks s/p right hip replaced, only off for 1 wk prior to current RLE DVT  Preventive Screening-Counseling & Management      Drug Use:  no.    Problems Prior to Update: 1)  Deep Venous Thrombophlebitis, Leg, Right  (ICD-453.40) 2)  Frequency, Urinary  (ICD-788.41) 3)  Muscle Cramps  (ICD-729.82) 4)  Menopausal Disorder  (ICD-627.9) 5)  Hip Pain, Right, Chronic  (ICD-719.45) 6)  Leg Cramps  (ICD-729.82) 7)  Skin Lesion  (ICD-709.9) 8)  Other Nonspecific Findings Examination of Blood  (ICD-790.99) 9)  Preventive Health Care   (ICD-V70.0) 10)  Insomnia-sleep Disorder-unspec  (ICD-780.52) 11)  Back Pain  (ICD-724.5) 12)  Leg Pain, Right  (ICD-729.5) 13)  Family Hx Colon Cancer  (ICD-V16.0) 14)  Rectal Bleeding  (ICD-569.3) 15)  Allergic Rhinitis  (ICD-477.9) 16)  Mass, Superficial  (ICD-782.2) 17)  Sciatica, Right  (ICD-724.3) 18)  Hematochezia  (ICD-578.1) 19)  Preventive Health Care  (ICD-V70.0) 20)  Hyperlipidemia  (ICD-272.4) 21)  Pulmonary Embolism, Hx of  (ICD-V12.51) 22)  Tmj Syndrome  (ICD-524.60) 23)  Hemorrhoids  (ICD-455.6) 24)  Gerd  (ICD-530.81) 25)  Hx, Personal, Tuberculosis  (ICD-V12.01) 26)  Hypertension  (ICD-401.9)  Medications Prior to Update: 1)  Omeprazole 20 Mg  Cpdr (Omeprazole) .... 2 Po Once Daily 2)  Benazepril Hcl 40 Mg Tabs (Benazepril Hcl) .Marland Kitchen.. 1 By Mouth Once Daily 3)  Adult Aspirin Ec Low Strength 81 Mg  Tbec (Aspirin) .Marland Kitchen.. 1po Qd 4)  Fluticasone Propionate 50 Mcg/act  Susp (Fluticasone Propionate) .... 2 Spray/side Qd 5)  Pravachol 20 Mg  Tabs (Pravastatin Sodium) .Marland Kitchen.. 1po Qd 6)  Zolpidem Tartrate 10 Mg Tabs (Zolpidem Tartrate) .Marland Kitchen.. 1 By Mouth At Bedtime As Needed Sleep 7)  Hydrocodone-Acetaminophen 7.5-750 Mg Tabs (Hydrocodone-Acetaminophen) .Marland KitchenMarland KitchenMarland Kitchen 1  By Mouth Two Times A Day As Needed 8)  Robaxin-750 750 Mg Tabs (Methocarbamol) .Marland Kitchen.. 1 By Mouth Two Times A Day As Needed 9)  Diazepam 5 Mg Tabs (Diazepam) .Marland Kitchen.. 1po Two Times A Day 10)  Vesicare 5 Mg Tabs (Solifenacin Succinate) .Marland Kitchen.. 1 By Mouth Once Daily  Current Medications (verified): 1)  Omeprazole 20 Mg  Cpdr (Omeprazole) .... 2 Po Once Daily 2)  Benazepril Hcl 40 Mg Tabs (Benazepril Hcl) .Marland Kitchen.. 1 By Mouth Once Daily 3)  Adult Aspirin Ec Low Strength 81 Mg  Tbec (Aspirin) .Marland Kitchen.. 1po Qd 4)  Fluticasone Propionate 50 Mcg/act  Susp (Fluticasone Propionate) .... 2 Spray/side Qd 5)  Pravachol 20 Mg  Tabs (Pravastatin Sodium) .Marland Kitchen.. 1po Qd 6)  Zolpidem Tartrate 10 Mg Tabs (Zolpidem Tartrate) .Marland Kitchen.. 1 By Mouth At Bedtime As Needed  Sleep 7)  Coumadin 5 Mg Tabs (Warfarin Sodium) .... Use Asd - 1 By Mouth Once Daily 8)  Lovenox 100 Mg/ml Soln (Enoxaparin Sodium) .... 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   Head:  normocephalic and atraumatic.   Eyes:  vision grossly intact, pupils equal, and pupils round.   Ears:  R ear normal and L ear normal.   Nose:  no external deformity and no nasal discharge.   Mouth:  no gingival abnormalities and pharynx pink and moist.   Neck:  supple and no masses.   Lungs:  normal respiratory effort and normal breath sounds.   Heart:  normal rate and regular rhythm.   Msk:  no acute joint tenderness and no joint swelling.   Extremities:  RLE 1-2+ diffuse swelling, mild tender right calf; LLE without pain, redness, tender or swelling   Impression & Recommendations:  Problem # 1:  DEEP VENOUS THROMBOPHLEBITIS, LEG, RIGHT (ICD-453.40) dx per ER Oct 31, doing well with initial tx, to  cont meds as is, check stat PT/INR with further recommendations pending results;  will likely need adjustment of coumadin with f/u with me, but then to coumadin clinic for more long term f/u;  since this is her second episode she is advised to consider this long term treatment, though she may be able to take other such as pradaxa in the future if approved in the [place] for this Orders: TLB-CBC Platelet - w/Differential (85025-CBCD) TLB-PT (Protime) (85610-PTP)  Problem # 2:  HYPERTENSION (ICD-401.9)  Her updated medication list for this problem includes:    Benazepril Hcl 40 Mg Tabs (Benazepril hcl) .Marland Kitchen... 1 by mouth once daily  BP today: 110/80 Prior BP: 142/90 (11/02/2009)  Labs Reviewed:  K+: 4.4 (09/16/2009) Creat: : 0.9 (09/16/2009)   Chol: 148 (09/16/2009)   HDL: 38.00 (09/16/2009)   LDL: 79 (09/16/2009)   TG: 155.0 (09/16/2009) stable overall by hx and exam, ok to continue meds/tx as is   Problem # 3:  OVERACTIVE BLADDER (ICD-596.51) stable overall by hx and exam, ok to continue meds/tx as is  - the vesicare  Complete Medication List: 1)  Omeprazole 20 Mg Cpdr (Omeprazole) .... 2 po once daily 2)  Benazepril Hcl 40 Mg Tabs (Benazepril hcl) .Marland Kitchen.. 1 by mouth once daily 3)  Adult Aspirin Ec Low Strength 81 Mg Tbec (Aspirin) .Marland Kitchen.. 1po qd 4)  Fluticasone Propionate 50 Mcg/act Susp (Fluticasone propionate) .... 2 spray/side qd 5)  Pravachol 20 Mg Tabs (Pravastatin sodium) .Marland Kitchen.. 1po qd 6)  Zolpidem Tartrate 10 Mg Tabs (Zolpidem tartrate) .Marland Kitchen.. 1 by mouth at bedtime as needed sleep 7)  Coumadin 5 Mg Tabs (Warfarin sodium) .... Use asd - 1 by mouth once daily 8)  Lovenox 100 Mg/ml Soln (Enoxaparin sodium) .... Use asd 1 cc subcutaneously once daily  Patient Instructions: 1)  Please go to the Lab in the basement for your blood tests today  - the stat INR (supposed to be between 2 and 3) 2)  Continue all previous medications as before this visit , including the lovenox until you are asked to  stop 3)  Please plan for now to come back Monday Nov 7 for repeat INR (453.40), but you may not need to if the INR today is between 2 and 3) 4)  We will eventually refer you to the Coumadin clinic, but it depends on your test today as to when that should be 5)  Please schedule a follow-up appointment in May 2012 with CPX labs Prescriptions: COUMADIN 5 MG TABS (WARFARIN SODIUM) use asd - 1 by mouth once daily  #120 x 3   Entered and Authorized by:   Corwin Levins MD   Signed by:   Corwin Levins MD on 03/04/2010   Method used:   Print then Give to Patient   RxID:   [id number] LOVENOX 100 MG/ML SOLN (ENOXAPARIN SODIUM) use asd 1 cc Subcutaneously once daily  #10 x 0   Entered and Authorized by:   Corwin Levins MD   Signed by:   Corwin Levins MD on 03/04/2010   Method used:   Print then Give to Patient   RxID:   [id number]    Orders Added: 1)  TLB-CBC Platelet - w/Differential [85025-CBCD] 2)  TLB-PT (Protime) [85610-PTP] 3)  Est. Patient Level IV [84696]

## 2010-06-08 ENCOUNTER — Encounter (INDEPENDENT_AMBULATORY_CARE_PROVIDER_SITE_OTHER): Payer: Self-pay

## 2010-06-08 ENCOUNTER — Encounter: Payer: Self-pay | Admitting: Internal Medicine

## 2010-06-08 DIAGNOSIS — Z7901 Long term (current) use of anticoagulants: Secondary | ICD-10-CM

## 2010-06-08 DIAGNOSIS — I80299 Phlebitis and thrombophlebitis of other deep vessels of unspecified lower extremity: Secondary | ICD-10-CM

## 2010-06-14 DIAGNOSIS — I82409 Acute embolism and thrombosis of unspecified deep veins of unspecified lower extremity: Secondary | ICD-10-CM

## 2010-06-16 NOTE — Medication Information (Signed)
Summary: [facility]   Anticoagulant Therapy  Managed by: Windell Hummingbird, RN Referring MD: Dr. Oliver Barre PCP: Oliver Barre MD Supervising MD: Graciela Husbands MD, Viviann Spare Indication 1: DVT/PE (recurrent or continuing risk factors) Lab Used: [facility] [place] Site: [address] INR RANGE 2.0-3.0  Dietary changes: no    Health status changes: no    Bleeding/hemorrhagic complications: no    Recent/future hospitalizations: no    Any changes in medication regimen? no    Recent/future dental: no  Any missed doses?: no       Is patient compliant with meds? yes       Allergies: No Known Drug Allergies  Anticoagulation Management History:      The patient is taking warfarin and comes in today for a routine follow up visit.  Positive risk factors for bleeding include history of GI bleeding.  Negative risk factors for bleeding include an age less than 19 years old.  The bleeding index is 'intermediate risk'.  Positive CHADS2 values include History of HTN.  Negative CHADS2 values include Age > 9 years old.  Her last INR was 3.4 ratio.  Anticoagulation responsible provider: Graciela Husbands MD, Viviann Spare.  Cuvette Lot#: [id number].  Exp: 05/2011.    Anticoagulation Management Assessment/Plan:      The patient's current anticoagulation dose is Coumadin 5 mg tabs: use asd - 1 by mouth once daily.  The next INR is due 06/29/2010.  Results were reviewed/authorized by Windell Hummingbird, RN.  She was notified by Windell Hummingbird, RN.         Prior Anticoagulation Instructions: INR 1.8 (INR goal: 2-3)  Take 1 tablet everyday except 1 and 1/2 tablets on Wednesdays.  Recheck in 2 weeks.      Current Anticoagulation Instructions: INR 2.1 Continue taking 1 tablet everyday, except take 1.5 tablets on Wednesdays. Recheck 3 weeks.

## 2010-06-29 ENCOUNTER — Encounter: Payer: Self-pay | Admitting: Cardiovascular Disease

## 2010-06-29 ENCOUNTER — Encounter (INDEPENDENT_AMBULATORY_CARE_PROVIDER_SITE_OTHER): Payer: Self-pay

## 2010-06-29 DIAGNOSIS — I80299 Phlebitis and thrombophlebitis of other deep vessels of unspecified lower extremity: Secondary | ICD-10-CM

## 2010-06-29 DIAGNOSIS — Z7901 Long term (current) use of anticoagulants: Secondary | ICD-10-CM

## 2010-06-29 DIAGNOSIS — I2699 Other pulmonary embolism without acute cor pulmonale: Secondary | ICD-10-CM

## 2010-06-29 LAB — CONVERTED CEMR LAB: POC INR: 1.8

## 2010-07-07 NOTE — Medication Information (Signed)
Summary: rov/pc   Anticoagulant Therapy  Managed by: Weston Brass, PharmD Referring MD: Dr. Oliver Barre PCP: Oliver Barre MD Supervising MD: Excell Seltzer MD, Casimiro Needle Indication 1: DVT/PE (recurrent or continuing risk factors) Lab Used: [facility] [place] Site: [address] INR POC 1.8 INR RANGE 2.0-3.0  Dietary changes: no    Health status changes: no    Bleeding/hemorrhagic complications: no    Recent/future hospitalizations: no    Any changes in medication regimen? no    Recent/future dental: no  Any missed doses?: no       Is patient compliant with meds? yes      Comments: Patient was only taking 1 tablet daily she did not understand that she was supposed to continue taking 1.5 tablets every Wednesday after her last visit.    Allergies: No Known Drug Allergies  Anticoagulation Management History:      The patient is taking warfarin and comes in today for a routine follow up visit.  Positive risk factors for bleeding include history of GI bleeding.  Negative risk factors for bleeding include an age less than 19 years old.  The bleeding index is 'intermediate risk'.  Positive CHADS2 values include History of HTN.  Negative CHADS2 values include Age > 32 years old.  Her last INR was 3.4 ratio.  Anticoagulation responsible provider: Excell Seltzer MD, Casimiro Needle.  INR POC: 1.8.  Cuvette Lot#: [id number].  Exp: 05/2011.    Anticoagulation Management Assessment/Plan:      The patient's current anticoagulation dose is Coumadin 5 mg tabs: use asd - 1 by mouth once daily.  The next INR is due 07/20/2010.  Results were reviewed/authorized by Weston Brass, PharmD.  She was notified by Margot Chimes PharmD Candidate.         Prior Anticoagulation Instructions: INR 2.1 Continue taking 1 tablet everyday, except take 1.5 tablets on Wednesdays. Recheck 3 weeks.  Current Anticoagulation Instructions: INR 1.8  Take another 1/2 tablet when you get home.  Then start taking 1 tablet daily  except on Wednesdays when you take 1.5.  Recheck INR in 3 weeks.

## 2010-07-20 ENCOUNTER — Ambulatory Visit (INDEPENDENT_AMBULATORY_CARE_PROVIDER_SITE_OTHER): Payer: Self-pay | Admitting: *Deleted

## 2010-07-20 DIAGNOSIS — I82409 Acute embolism and thrombosis of unspecified deep veins of unspecified lower extremity: Secondary | ICD-10-CM

## 2010-07-20 NOTE — Patient Instructions (Signed)
INR 2.0 Continue 1 pill everyday except 1.5 pills on Wednesdays. Recheck in 3 weeks.

## 2010-08-01 ENCOUNTER — Other Ambulatory Visit: Payer: Self-pay | Admitting: Internal Medicine

## 2010-08-10 ENCOUNTER — Ambulatory Visit (INDEPENDENT_AMBULATORY_CARE_PROVIDER_SITE_OTHER): Payer: Self-pay | Admitting: *Deleted

## 2010-08-10 DIAGNOSIS — I82409 Acute embolism and thrombosis of unspecified deep veins of unspecified lower extremity: Secondary | ICD-10-CM

## 2010-08-10 DIAGNOSIS — Z7901 Long term (current) use of anticoagulants: Secondary | ICD-10-CM

## 2010-08-10 NOTE — Patient Instructions (Addendum)
INR 1.8  Coumadin 5mg  tabs Take 1 and 1/2 tab tomorrow THUR 4/12 as an extra day  Then 1 and 1/2 tab on WED and 1 tab all other days Return in 4 weeks WED May 9 at 3:15 pm

## 2010-09-07 ENCOUNTER — Ambulatory Visit (INDEPENDENT_AMBULATORY_CARE_PROVIDER_SITE_OTHER): Payer: Self-pay | Admitting: *Deleted

## 2010-09-07 DIAGNOSIS — I82409 Acute embolism and thrombosis of unspecified deep veins of unspecified lower extremity: Secondary | ICD-10-CM

## 2010-09-07 LAB — POCT INR: INR: 2.4

## 2010-09-16 NOTE — Op Note (Signed)
[place]  Patient:    Brianna Walker, Brianna Walker Visit Number: [id number] MRN: [id number]          Service Type: DSU Location: [facility] 2550 04 Attending Physician:  Sonda Primes Dictated by:   Mardene Celeste. Lurene Shadow, M.D. Proc. Date: 04/16/01 Admit Date:  04/16/2001 Discharge Date: 04/16/2001                             Operative Report  PREOPERATIVE DIAGNOSIS:  Recurrent ventral hernia.  POSTOPERATIVE DIAGNOSIS:  Recurrent ventral hernia.  OPERATION PERFORMED:  Repair of recurrent ventral hernia.  SURGEON:  Mardene Celeste. Lurene Shadow, M.D.  ASSISTANT:  Nurse.  ANESTHESIA:  General.  INDICATIONS FOR PROCEDURE:  The patient is a 64 year old woman status post open cholecystectomy in the remote past.  She developed a hernia on the medial aspect of the wound which was repaired in the past.  She returns now with a hernia extending into the central portion of the wound.  After risks and benefits of the hernia surgery had been fully discussed with her she was brought to the operating room for repair.  DESCRIPTION OF PROCEDURE:  Following the induction of satisfactory general anesthesia with the patient positioned supinely, the abdomen was routinely prepped and draped to be included in a sterile operative field.  A transverse incision was made over the large hernial defect deepened through the skin and subcutaneous tissues carrying the dissection down to the hernia sac. The sac was freed from the surrounding soft tissue down to the fascia.  The sac was opened and incarcerated omentum within the sac was dissected away from the sac.  A portion of the omentum was then resected and forwarded for pathologic evaluation along with the sac.  The edge of the hernia were cleared on all sides and a some Vicryl mesh was placed within the wound so as to protect the viscera from the overlying Prolene mesh.  The Prolene mesh was sewn into the defect with a running suture  of #1 Novofil.  Sponge, instrument and sharp counts were verified.  The repair appeared to be intact. Subcutaneous tissues were then closed with a running 2-0 Vicryl suture.  Skin closed with a running 4-0 Monocryl suture and then reinforced with Steri-Strips and Steri-Strips applied.  Anesthetic reversed.  Patient removed from the operating room to the recovery room in stable condition having tolerated the procedure well. Dictated by:   Mardene Celeste. Lurene Shadow, M.D. Attending Physician:  Sonda Primes DD:  04/16/01 TD:  04/16/01 Job: [id number]

## 2010-09-16 NOTE — Op Note (Signed)
Brianna Walker, Brianna Walker               ACCOUNT NO.:  [id number]   MEDICAL RECORD NO.:  [id number]          PATIENT TYPE:  INP   LOCATION:  1408                         FACILITY:  [facility]   PHYSICIAN:  Leonie Man, M.D.   DATE OF BIRTH:  January 13, 1946   DATE OF PROCEDURE:  01/13/2005  DATE OF DISCHARGE:  01/16/2005                                 OPERATIVE REPORT   PREOPERATIVE DIAGNOSIS:  Recurrent incarcerated ventral hernia.   POSTOPERATIVE DIAGNOSIS:  Recurrent incarcerated ventral hernia.   PROCEDURE:  Repair of recurrent incarcerated ventral hernia with mesh.   SURGEON:  Leonie Man, MD.   ASSISTANT:  Ovidio Kin, MD   ANESTHESIA:  General.   SPECIMENS TO PATHOLOGY:  Hernia sac with omentum.   ESTIMATED BLOOD LOSS:  Approximately 50 mL.   COMPLICATIONS:  There were no apparent complications during this procedure.   HISTORY:  Note, Ms. Zeiser is a 65 year old obese female who is status  post open gallbladder repair who developed a hernia in the medial aspect of  the wound. This was previously repaired with mesh. She has subsequently  developed a recurrent hernia with multiple herniations across the abdominal  wall. She has been having pain and anorexia from the intimate incarcerations  of her hernia. She comes to the operating room now for repair. The risks and  benefits of surgery have been discussed with her, all questions answered and  consent obtained.   DESCRIPTION OF PROCEDURE:  Following the induction of satisfactory  anesthesia with the patient positioned supinely, the abdomen is prepped and  draped to be included in a sterile operative field. The old transverse  abdominal wall incision is opened with removal of an ellipse of skin and old  scar cicatrix. Dissection is carried down through the abdominal wall to the  hernia. There were multiple defects noted in the abdominal wall. These were  each opened with extensive adhesiolysis. The adhesiolysis lasted  approximately 45 minutes and the hernia was then opened with all of the  defects enjoined into a single large defect. I used a large piece of PROCEED  type mesh in an onlay fashion over the defect. This was sutured in place in  eight places using interrupted #1 Novofil. The mesh was then stapled to the  parietes using an endoscopic tacker placing staples approximately 1 cm apart  around the entire defect. The repair was noted to be intact. Sponge,  instrument and sharp counts were verified. The subcutaneous tissues were  closed with interrupted 2-0 Vicryl sutures and the skin closed with a  running 4-0  subcuticular Monocryl suture. The wound was then reinforced with Steri-  Strips, sterile dressings applied, anesthetic reversed and the patient  removed from the operating room to the recovery room in stable condition.  She tolerated the procedure well.      Leonie Man, M.D.  Electronically Signed     PB/MEDQ  D:  01/26/2005  T:  01/26/2005  Job:  [id number]

## 2010-09-16 NOTE — Discharge Summary (Signed)
NAMEPROSPERITY, DARROUGH               ACCOUNT NO.:  [id number]   MEDICAL RECORD NO.:  [id number]          PATIENT TYPE:  INP   LOCATION:  1408                         FACILITY:  [facility]   PHYSICIAN:  Leonie Man, M.D.   DATE OF BIRTH:  [birth year]   DATE OF ADMISSION:  01/13/2005  DATE OF DISCHARGE:  01/16/2005                                 DISCHARGE SUMMARY   ADMISSION DIAGNOSIS:  Incarcerated recurrent ventral hernia.   DISCHARGE DIAGNOSES:  1.  Incarcerated recurrent ventral hernia.  2.  Hypertension.  3.  Reflux esophagitis.  4.  Sickle cell trait.  5.  Status post total abdominal hysterectomy.  6.  History of tobacco use.   PROCEDURES IN HOSPITAL:  Repair of recurrent incarcerated ventral hernia  with mesh.   COMPLICATIONS:  None.   CONDITION ON DISCHARGE:  Improved.   Ms. Duquette is a 65 year old patient who underwent a cholecystectomy in the  remote past.  She subsequently developed a hernia through this and this was  repaired with mesh in the past.  She has since had another recurrence and  was brought to the operating room on the day of admission for repair of her  recurrent incarcerated ventral hernia.  Her postoperative course has been  benign with normal resumption of diet and activity.  She is being discharged  now to be followed up in the office in two to three weeks.   DISCHARGE MEDICATIONS:  Vicodin one to two every four hours p.r.n.   ACTIVITY:  As tolerated.   DIET:  Unrestricted.      Leonie Man, M.D.  Electronically Signed     PB/MEDQ  D:  02/08/2005  T:  02/08/2005  Job:  [id number]

## 2010-09-16 NOTE — Op Note (Signed)
   NAME:  Brianna Walker, Brianna Walker                         ACCOUNT NO.:  [id number]   MEDICAL RECORD NO.:  [id number]                   PATIENT TYPE:  AMB   LOCATION:  ENDO                                 FACILITY:  [facility]   PHYSICIAN:  Danise Edge, M.D.                DATE OF BIRTH:  Apr 13, 1946   DATE OF PROCEDURE:  10/17/2002  DATE OF DISCHARGE:  10/17/2002                                 OPERATIVE REPORT   PROCEDURE PERFORMED:  Colonoscopy.   REFERRING PHYSICIAN:  Sigmund Hazel, M.D.   ENDOSCOPIST:  Charolett Bumpers, M.D.   INDICATIONS FOR PROCEDURE:  Ms. Brianna Crance. Walker is a 65 year old female  born November 06, 1945.  Brianna Walker's father was diagnosed with colon cancer  in his early 37's.  Brianna Walker has intermittent painless hematochezia.   PREMEDICATION:  Versed 10 mg, Demerol 100 mg.   DESCRIPTION OF PROCEDURE:  After obtaining informed consent, Brianna Walker  was placed in the left lateral decubitus position.  I administered  intravenous Demerol and intravenous Versed to achieve conscious sedation for  the procedure.  The patient's blood pressure, oxygen saturations and cardiac  rhythm were monitored throughout the procedure and documented in the medical  record.   Anal inspection was normal.  Digital rectal exam was normal.  The pediatric  Olympus adjustable colonoscope was introduced into the rectum and advanced  to the cecum.  Colonic preparation for the exam today was excellent.   Rectum:  Normal.  Brianna Walker has large internal hemorrhoids which probably  the source of her intermittent hematochezia.   Sigmoid colon and descending colon:  Left colonic diverticulosis.   Splenic flexure:  Normal.   Transverse colon:  Normal.   Hepatic flexure:  Normal.   Ascending colon:  Normal.   Cecum and ileocecal valve:  Normal.   ASSESSMENT:  1. No endoscopic evidence for the presence of colorectal Neoplasia.  2.     Left colonic diverticulosis.  3. Large internal  hemorrhoids which are not bleeding.   RECOMMENDATIONS:  Repeat colonoscopy in five years.                                               Danise Edge, M.D.    MJ/MEDQ  D:  10/17/2002  T:  10/20/2002  Job:  [id number]   cc:   Sigmund Hazel, M.D.  [address]  [address] [place], [place] [address]  Fax: [phone]

## 2010-10-05 ENCOUNTER — Ambulatory Visit (INDEPENDENT_AMBULATORY_CARE_PROVIDER_SITE_OTHER): Payer: Self-pay | Admitting: *Deleted

## 2010-10-05 DIAGNOSIS — I82409 Acute embolism and thrombosis of unspecified deep veins of unspecified lower extremity: Secondary | ICD-10-CM

## 2010-10-05 LAB — POCT INR: INR: 1.8

## 2010-11-01 ENCOUNTER — Ambulatory Visit (INDEPENDENT_AMBULATORY_CARE_PROVIDER_SITE_OTHER): Payer: Self-pay | Admitting: *Deleted

## 2010-11-01 DIAGNOSIS — I82409 Acute embolism and thrombosis of unspecified deep veins of unspecified lower extremity: Secondary | ICD-10-CM

## 2010-11-29 ENCOUNTER — Ambulatory Visit (INDEPENDENT_AMBULATORY_CARE_PROVIDER_SITE_OTHER): Payer: Self-pay | Admitting: *Deleted

## 2010-11-29 DIAGNOSIS — I82409 Acute embolism and thrombosis of unspecified deep veins of unspecified lower extremity: Secondary | ICD-10-CM

## 2010-11-29 DIAGNOSIS — Z7901 Long term (current) use of anticoagulants: Secondary | ICD-10-CM

## 2010-11-29 HISTORY — DX: Long term (current) use of anticoagulants: Z79.01

## 2010-11-29 LAB — POCT INR: INR: 2.4

## 2010-12-28 ENCOUNTER — Encounter: Payer: Self-pay | Admitting: *Deleted

## 2010-12-30 ENCOUNTER — Ambulatory Visit (INDEPENDENT_AMBULATORY_CARE_PROVIDER_SITE_OTHER): Payer: Self-pay | Admitting: *Deleted

## 2010-12-30 DIAGNOSIS — I82409 Acute embolism and thrombosis of unspecified deep veins of unspecified lower extremity: Secondary | ICD-10-CM

## 2010-12-30 LAB — POCT INR: INR: 2

## 2011-01-25 ENCOUNTER — Ambulatory Visit (INDEPENDENT_AMBULATORY_CARE_PROVIDER_SITE_OTHER): Payer: Self-pay | Admitting: *Deleted

## 2011-01-25 DIAGNOSIS — I82409 Acute embolism and thrombosis of unspecified deep veins of unspecified lower extremity: Secondary | ICD-10-CM

## 2011-01-25 LAB — POCT INR: INR: 2.2

## 2011-02-22 ENCOUNTER — Ambulatory Visit (INDEPENDENT_AMBULATORY_CARE_PROVIDER_SITE_OTHER): Payer: Self-pay | Admitting: *Deleted

## 2011-02-22 DIAGNOSIS — Z7901 Long term (current) use of anticoagulants: Secondary | ICD-10-CM

## 2011-02-22 DIAGNOSIS — I82409 Acute embolism and thrombosis of unspecified deep veins of unspecified lower extremity: Secondary | ICD-10-CM

## 2011-02-23 ENCOUNTER — Encounter: Payer: Self-pay | Admitting: Internal Medicine

## 2011-02-23 ENCOUNTER — Emergency Department (HOSPITAL_COMMUNITY)
Admission: EM | Admit: 2011-02-23 | Discharge: 2011-02-23 | Disposition: A | Discharge status: Home / Self Care | Payer: Self-pay | Attending: Emergency Medicine | Admitting: Emergency Medicine

## 2011-02-23 ENCOUNTER — Emergency Department (HOSPITAL_COMMUNITY): Payer: Self-pay

## 2011-02-23 DIAGNOSIS — Z79899 Other long term (current) drug therapy: Secondary | ICD-10-CM | POA: Insufficient documentation

## 2011-02-23 DIAGNOSIS — G459 Transient cerebral ischemic attack, unspecified: Secondary | ICD-10-CM | POA: Insufficient documentation

## 2011-02-23 DIAGNOSIS — R4182 Altered mental status, unspecified: Secondary | ICD-10-CM | POA: Insufficient documentation

## 2011-02-23 DIAGNOSIS — I10 Essential (primary) hypertension: Secondary | ICD-10-CM | POA: Insufficient documentation

## 2011-02-23 DIAGNOSIS — R413 Other amnesia: Secondary | ICD-10-CM | POA: Insufficient documentation

## 2011-02-23 DIAGNOSIS — Z7901 Long term (current) use of anticoagulants: Secondary | ICD-10-CM | POA: Insufficient documentation

## 2011-02-23 DIAGNOSIS — Z0000 Encounter for general adult medical examination without abnormal findings: Secondary | ICD-10-CM | POA: Insufficient documentation

## 2011-02-23 DIAGNOSIS — K219 Gastro-esophageal reflux disease without esophagitis: Secondary | ICD-10-CM | POA: Insufficient documentation

## 2011-02-23 LAB — CBC
MCH: 24.5 pg — ABNORMAL LOW (ref 26.0–34.0)
MCHC: 32.4 g/dL (ref 30.0–36.0)
MCV: 75.7 fL — ABNORMAL LOW (ref 78.0–100.0)
Platelets: 329 10*3/uL (ref 150–400)
RDW: 16.6 % — ABNORMAL HIGH (ref 11.5–15.5)

## 2011-02-23 LAB — DIFFERENTIAL
Basophils Relative: 0 % (ref 0–1)
Eosinophils Absolute: 0.2 10*3/uL (ref 0.0–0.7)
Eosinophils Relative: 2 % (ref 0–5)
Monocytes Absolute: 0.3 10*3/uL (ref 0.1–1.0)
Monocytes Relative: 4 % (ref 3–12)
Neutrophils Relative %: 71 % (ref 43–77)

## 2011-02-23 LAB — COMPREHENSIVE METABOLIC PANEL
Albumin: 3.3 g/dL — ABNORMAL LOW (ref 3.5–5.2)
BUN: 10 mg/dL (ref 6–23)
Creatinine, Ser: 0.86 mg/dL (ref 0.50–1.10)
GFR calc Af Amer: 82 mL/min — ABNORMAL LOW (ref >90)
Glucose, Bld: 96 mg/dL (ref 70–99)
Total Bilirubin: 0.5 mg/dL (ref 0.3–1.2)
Total Protein: 8.1 g/dL (ref 6.0–8.3)

## 2011-02-23 LAB — PROTIME-INR: Prothrombin Time: 29.4 seconds — ABNORMAL HIGH (ref 11.6–15.2)

## 2011-02-24 ENCOUNTER — Other Ambulatory Visit (INDEPENDENT_AMBULATORY_CARE_PROVIDER_SITE_OTHER): Payer: Self-pay

## 2011-02-24 ENCOUNTER — Ambulatory Visit (INDEPENDENT_AMBULATORY_CARE_PROVIDER_SITE_OTHER): Payer: Self-pay | Admitting: Internal Medicine

## 2011-02-24 ENCOUNTER — Encounter: Payer: Self-pay | Admitting: Internal Medicine

## 2011-02-24 VITALS — BP 120/82 | HR 65 | Temp 97.9°F | Ht 65.0 in | Wt 223.5 lb

## 2011-02-24 DIAGNOSIS — E785 Hyperlipidemia, unspecified: Secondary | ICD-10-CM

## 2011-02-24 DIAGNOSIS — G459 Transient cerebral ischemic attack, unspecified: Secondary | ICD-10-CM

## 2011-02-24 DIAGNOSIS — I10 Essential (primary) hypertension: Secondary | ICD-10-CM

## 2011-02-24 LAB — LIPID PANEL
Cholesterol: 147 mg/dL (ref 0–200)
LDL Cholesterol: 71 mg/dL (ref 0–99)
Triglycerides: 177 mg/dL — ABNORMAL HIGH (ref 0.0–149.0)
VLDL: 35.4 mg/dL (ref 0.0–40.0)

## 2011-02-24 MED ORDER — PRAVASTATIN SODIUM 20 MG PO TABS: 20.0000 mg | ORAL_TABLET | Freq: Every evening | ORAL | Status: DC

## 2011-02-24 MED ORDER — BENAZEPRIL HCL 40 MG PO TABS: 40.0000 mg | ORAL_TABLET | Freq: Every day | ORAL | Status: DC

## 2011-02-24 MED ORDER — OMEPRAZOLE 20 MG PO CPDR: 20.0000 mg | DELAYED_RELEASE_CAPSULE | Freq: Two times a day (BID) | ORAL | Status: DC

## 2011-02-24 NOTE — Assessment & Plan Note (Signed)
stable overall by hx and exam, most recent data reviewed with pt, and pt to continue medical treatment as before, but will need lipids, carotids and echo to f/u

## 2011-02-24 NOTE — Patient Instructions (Signed)
Continue all other medications as before Please go to LAB in the Basement for the blood and/or urine tests to be done today You will be contacted regarding the referral for: echocardiogram, and carotid ultrasound

## 2011-02-25 ENCOUNTER — Encounter: Payer: Self-pay | Admitting: Internal Medicine

## 2011-02-25 NOTE — Assessment & Plan Note (Signed)
stable overall by hx and exam, most recent data reviewed with pt, and pt to continue medical treatment as before  Lab Results  Component Value Date   LDLCALC 71 02/24/2011

## 2011-02-25 NOTE — Progress Notes (Signed)
  Subjective:    Patient ID: Brianna Walker, female    DOB: [birth year], 65 y.o.   MRN: [id number]  HPI  Here to f/u after recent eval in ER for episode of confusion, resolved in approx 2 hrs, CT head and MRI brain neg, dx with TIA, to cont ASA/coumadin, INR adequate, cbc/istat neg;  And asked to f/u here.  Pt denies chest pain, increased sob or doe, wheezing, orthopnea, PND, increased LE swelling, palpitations, dizziness or syncope.  Pt denies new neurological symptoms such as new headache, or facial or extremity weakness or numbness   Pt denies polydipsia, polyuria, Pt states overall good compliance with meds, trying to follow lower cholesterol diet, wt overall stable but little exercise however.  No recent carotid or echo eval.  Or lipids,  Recent glc normal Past Medical History  Diagnosis Date  . ALLERGIC RHINITIS 09/12/2007  . BACK PAIN 06/19/2008  . Cramp of limb 08/11/2009  . DEEP VENOUS THROMBOPHLEBITIS, LEG, RIGHT 03/04/2010  . Encounter for long-term (current) use of anticoagulants 11/29/2010  . FREQUENCY, URINARY 10/07/2009  . GERD 12/05/2006  . HEMORRHOIDS 11/17/2006  . HIP PAIN, RIGHT, CHRONIC 08/11/2009  . HX, PERSONAL, TUBERCULOSIS 11/17/2006  . HYPERLIPIDEMIA 09/12/2007  . HYPERTENSION 11/17/2006  . INSOMNIA-SLEEP DISORDER-UNSPEC 06/19/2008  . LEG PAIN, RIGHT 06/19/2008  . MASS, SUPERFICIAL 09/12/2007  . MENOPAUSAL DISORDER 09/20/2009  . OVERACTIVE BLADDER 03/04/2010  . PULMONARY EMBOLISM, HX OF 11/17/2006  . RECTAL BLEEDING 10/07/2007  . SCIATICA, RIGHT 09/12/2007  . SKIN LESION 07/20/2009  . TMJ SYNDROME 11/17/2006   Past Surgical History  Procedure Date  . Cholecystectomy   . Abdominal hysterectomy   . Inguinal herniorrhapy     multiple ventral hernia repair/mesh  . Tumor removed off of right shoulder   . S/p right hip replaced min invasive hip surgury [facility] oct 2011     reports that she has never smoked. She does not have any smokeless tobacco history on file. She reports that  she does not drink alcohol or use illicit drugs. family history is not on file. No Known Allergies    Review of Systems Review of Systems  Constitutional: Negative for diaphoresis and unexpected weight change.  HENT: Negative for drooling and tinnitus.   Eyes: Negative for photophobia and visual disturbance.  Respiratory: Negative for choking and stridor.   Gastrointestinal: Negative for vomiting and blood in stool.  Genitourinary: Negative for hematuria and decreased urine volume.  Musculoskeletal: Negative for gait problem.  Skin: Negative for color change and wound.  Neurological: Negative for tremors and numbness.    Objective:   Physical Exam BP 120/82  Pulse 65  Temp(Src) 97.9 F (36.6 C) (Oral)  Ht 5\' 5"  (1.651 m)  Wt 223 lb 8 oz (101.379 kg)  BMI 37.19 kg/m2  SpO2 95% Physical Exam  VS noted Constitutional: Pt appears well-developed and well-nourished.  HENT: Head: Normocephalic.  Right Ear: External ear normal.  Left Ear: External ear normal.  Eyes: Conjunctivae and EOM are normal. Pupils are equal, round, and reactive to light.  Neck: Normal range of motion. Neck supple.  Cardiovascular: Normal rate and regular rhythm.   Pulmonary/Chest: Effort normal and breath sounds normal.  Abd:  Soft, NT, non-distended, + BS Neurological: Pt is alert. No cranial nerve deficit. motor/dtr/gait intact Skin: Skin is warm. No erythema.  Psychiatric: Pt behavior is normal. Thought content normal.         Assessment & Plan:

## 2011-02-25 NOTE — Assessment & Plan Note (Signed)
.  stable overall by hx and exam, most recent data reviewed with pt, and pt to continue medical treatment as before  BP Readings from Last 3 Encounters:  02/24/11 120/82  03/04/10 110/80  11/02/09 142/90

## 2011-03-07 ENCOUNTER — Ambulatory Visit (HOSPITAL_COMMUNITY): Payer: Self-pay | Attending: Internal Medicine | Admitting: Radiology

## 2011-03-07 DIAGNOSIS — G459 Transient cerebral ischemic attack, unspecified: Secondary | ICD-10-CM | POA: Insufficient documentation

## 2011-03-07 DIAGNOSIS — I059 Rheumatic mitral valve disease, unspecified: Secondary | ICD-10-CM | POA: Insufficient documentation

## 2011-04-05 ENCOUNTER — Ambulatory Visit (INDEPENDENT_AMBULATORY_CARE_PROVIDER_SITE_OTHER): Payer: Self-pay | Admitting: *Deleted

## 2011-04-05 DIAGNOSIS — I82409 Acute embolism and thrombosis of unspecified deep veins of unspecified lower extremity: Secondary | ICD-10-CM

## 2011-04-05 DIAGNOSIS — Z23 Encounter for immunization: Secondary | ICD-10-CM

## 2011-04-05 DIAGNOSIS — Z7901 Long term (current) use of anticoagulants: Secondary | ICD-10-CM

## 2011-04-30 ENCOUNTER — Other Ambulatory Visit: Payer: Self-pay | Admitting: Internal Medicine

## 2011-05-17 ENCOUNTER — Ambulatory Visit (INDEPENDENT_AMBULATORY_CARE_PROVIDER_SITE_OTHER): Payer: Self-pay | Admitting: *Deleted

## 2011-05-17 DIAGNOSIS — Z7901 Long term (current) use of anticoagulants: Secondary | ICD-10-CM

## 2011-05-17 DIAGNOSIS — I82409 Acute embolism and thrombosis of unspecified deep veins of unspecified lower extremity: Secondary | ICD-10-CM

## 2011-05-17 LAB — POCT INR: INR: 2.5

## 2011-06-28 ENCOUNTER — Ambulatory Visit (INDEPENDENT_AMBULATORY_CARE_PROVIDER_SITE_OTHER): Payer: Self-pay | Admitting: Pharmacist

## 2011-06-28 DIAGNOSIS — I82409 Acute embolism and thrombosis of unspecified deep veins of unspecified lower extremity: Secondary | ICD-10-CM

## 2011-06-28 DIAGNOSIS — Z7901 Long term (current) use of anticoagulants: Secondary | ICD-10-CM

## 2011-06-28 LAB — POCT INR: INR: 2.3

## 2011-08-09 ENCOUNTER — Ambulatory Visit (INDEPENDENT_AMBULATORY_CARE_PROVIDER_SITE_OTHER): Payer: Self-pay | Admitting: *Deleted

## 2011-08-09 DIAGNOSIS — I82409 Acute embolism and thrombosis of unspecified deep veins of unspecified lower extremity: Secondary | ICD-10-CM

## 2011-08-09 DIAGNOSIS — Z7901 Long term (current) use of anticoagulants: Secondary | ICD-10-CM

## 2011-08-14 ENCOUNTER — Encounter: Payer: Self-pay | Admitting: Internal Medicine

## 2011-08-14 ENCOUNTER — Ambulatory Visit (INDEPENDENT_AMBULATORY_CARE_PROVIDER_SITE_OTHER): Payer: Self-pay | Admitting: Internal Medicine

## 2011-08-14 VITALS — BP 124/70 | HR 81 | Temp 97.7°F | Ht 65.5 in | Wt 223.5 lb

## 2011-08-14 DIAGNOSIS — I10 Essential (primary) hypertension: Secondary | ICD-10-CM

## 2011-08-14 DIAGNOSIS — M25512 Pain in left shoulder: Secondary | ICD-10-CM

## 2011-08-14 DIAGNOSIS — R21 Rash and other nonspecific skin eruption: Secondary | ICD-10-CM | POA: Insufficient documentation

## 2011-08-14 DIAGNOSIS — M25519 Pain in unspecified shoulder: Secondary | ICD-10-CM

## 2011-08-14 DIAGNOSIS — J329 Chronic sinusitis, unspecified: Secondary | ICD-10-CM | POA: Insufficient documentation

## 2011-08-14 DIAGNOSIS — J019 Acute sinusitis, unspecified: Secondary | ICD-10-CM

## 2011-08-14 MED ORDER — CLOTRIMAZOLE-BETAMETHASONE 1-0.05 % EX CREA: TOPICAL_CREAM | CUTANEOUS | Status: DC

## 2011-08-14 MED ORDER — MOMETASONE FUROATE 50 MCG/ACT NA SUSP: 2.0000 | Freq: Every day | NASAL | Status: DC

## 2011-08-14 MED ORDER — AZITHROMYCIN 250 MG PO TABS: ORAL_TABLET | ORAL | Status: AC

## 2011-08-14 NOTE — Assessment & Plan Note (Signed)
Mild below right breast, for lotrisone asd course,  to f/u any worsening symptoms or concerns

## 2011-08-14 NOTE — Assessment & Plan Note (Signed)
stable overall by hx and exam, most recent data reviewed with pt, and pt to continue medical treatment as before  BP Readings from Last 3 Encounters:  08/14/11 124/70  02/24/11 120/82  03/04/10 110/80

## 2011-08-14 NOTE — Assessment & Plan Note (Signed)
Ok to change flonase to nasonex (gave discount coupon), and I think needs to see ENT but she delcines at this time, will call back later if not doing well

## 2011-08-14 NOTE — Assessment & Plan Note (Signed)
High suspicion for left rot cuff tear - for MRI right shoulder, refer orthopedic

## 2011-08-14 NOTE — Progress Notes (Signed)
Subjective:    Patient ID: Brianna Walker, female    DOB: 1945/05/13, 66 y.o.   MRN: [id number]  HPI  Here with c/o 2-3 days acute on chronic 3 months sinus pain right > left with pain, hard to drain, flonase just does not help with this, now low grade temp, feeling poorly, general weakness.  No ST, HA, cough  And Pt denies chest pain, increased sob or doe, wheezing, orthopnea, PND, increased LE swelling, palpitations, dizziness or syncope.  Pt denies new neurological symptoms such as new headache, or facial or extremity weakness or numbness   Pt denies polydipsia, polyuria.  Does also have itchy chronic rash below the right breats, just cant stop scratching, breast/bra tends to overlap the abd wall and may irritate she thinks.  Also with 1 yr unable to abduct the left shoudler past 90 degrees with discomfort, started after several days of leaning on the left arm to get out of bed, cannot do that anymore Past Medical History  Diagnosis Date  . ALLERGIC RHINITIS 09/12/2007  . BACK PAIN 06/19/2008  . Cramp of limb 08/11/2009  . DEEP VENOUS THROMBOPHLEBITIS, LEG, RIGHT 03/04/2010  . Encounter for long-term (current) use of anticoagulants 11/29/2010  . FREQUENCY, URINARY 10/07/2009  . GERD 12/05/2006  . HEMORRHOIDS 11/17/2006  . HIP PAIN, RIGHT, CHRONIC 08/11/2009  . HX, PERSONAL, TUBERCULOSIS 11/17/2006  . HYPERLIPIDEMIA 09/12/2007  . HYPERTENSION 11/17/2006  . INSOMNIA-SLEEP DISORDER-UNSPEC 06/19/2008  . LEG PAIN, RIGHT 06/19/2008  . MASS, SUPERFICIAL 09/12/2007  . MENOPAUSAL DISORDER 09/20/2009  . OVERACTIVE BLADDER 03/04/2010  . PULMONARY EMBOLISM, HX OF 11/17/2006  . RECTAL BLEEDING 10/07/2007  . SCIATICA, RIGHT 09/12/2007  . SKIN LESION 07/20/2009  . TMJ SYNDROME 11/17/2006   Past Surgical History  Procedure Date  . Cholecystectomy   . Abdominal hysterectomy   . Inguinal herniorrhapy     multiple ventral hernia repair/mesh  . Tumor removed off of right shoulder   . S/p right hip replaced min invasive  hip surgury [facility] oct 2011     reports that she has never smoked. She does not have any smokeless tobacco history on file. She reports that she does not drink alcohol or use illicit drugs. family history is not on file. No Known Allergies Current Outpatient Prescriptions on File Prior to Visit  Medication Sig Dispense Refill  . aspirin 81 MG tablet Take 81 mg by mouth daily.        . benazepril (LOTENSIN) 40 MG tablet Take 1 tablet (40 mg total) by mouth daily.  90 tablet  3  . omeprazole (PRILOSEC) 20 MG capsule Take 20 mg by mouth 2 (two) times daily.        Marland Kitchen omeprazole (PRILOSEC) 20 MG capsule Take 1 capsule (20 mg total) by mouth 2 (two) times daily.  60 capsule  11  . pravastatin (PRAVACHOL) 20 MG tablet Take 20 mg by mouth daily.        . pravastatin (PRAVACHOL) 20 MG tablet Take 1 tablet (20 mg total) by mouth every evening.  90 tablet  3  . warfarin (COUMADIN) 5 MG tablet TAKE ONE TABLET BY MOUTH AS DIRECTED EVERY DAY  120 tablet  3  . mometasone (NASONEX) 50 MCG/ACT nasal spray Place 2 sprays into the nose daily.  17 g  2   Review of Systems Review of Systems  Constitutional: Negative for diaphoresis and unexpected weight change.  HENT: Negative for drooling and tinnitus.   Eyes: Negative for photophobia  and visual disturbance.  Respiratory: Negative for choking and stridor.   Gastrointestinal: Negative for vomiting and blood in stool.  Genitourinary: Negative for hematuria and decreased urine volume.  Musculoskeletal: Negative for gait problem.  Skin: Negative for color change and wound.     Objective:   Physical Exam BP 124/70  Pulse 81  Temp(Src) 97.7 F (36.5 C) (Oral)  Ht 5' 5.5" (1.664 m)  Wt 223 lb 8 oz (101.379 kg)  BMI 36.63 kg/m2  SpO2 98% Physical Exam  VS noted, mild ill Constitutional: Pt appears well-developed and well-nourished.  HENT: Head: Normocephalic.  Right Ear: External ear normal.  Left Ear: External ear normal.  Bilat tm's mild  erythema.  Sinus tender bilat.  Pharynx mild erythema Eyes: Conjunctivae and EOM are normal. Pupils are equal, round, and reactive to light.  Neck: Normal range of motion. Neck supple.  Cardiovascular: Normal rate and regular rhythm.   Pulmonary/Chest: Effort normal and breath sounds normal.  Abd:  Soft, NT, non-distended, + BS Neurological: Pt is alert. No cranial nerve deficit.  Skin: Skin is warm. No erythema. except for silvery rash irritative approx 4 cm area below right bresat Left shoulder nontender but unable to abduct or forward elevate past 90 degrees Psychiatric: Pt behavior is normal. Thought content normal. 1+ nervous    Assessment & Plan:

## 2011-08-14 NOTE — Patient Instructions (Addendum)
.  Take all new medications as prescribed - the antibiotic, cream for the rash, and the nasonex (instead of the flonase) with the coupon today Please call if you change your mind about a referral to ENT You will be contacted regarding the referral for: MRI for the left shoulder, and orthopedic Continue all other medications as before Please have the pharmacy call with any refills you may need.

## 2011-08-14 NOTE — Assessment & Plan Note (Signed)
Mild to mod, for antibx course,  to f/u any worsening symptoms or concerns 

## 2011-08-17 ENCOUNTER — Telehealth: Payer: Self-pay | Admitting: Internal Medicine

## 2011-08-17 NOTE — Telephone Encounter (Signed)
Pt has to pay up front fee of $250.00 with no Insurance for Orthopaedic . Spoke with pt and she declined referral at this time.

## 2011-09-14 ENCOUNTER — Ambulatory Visit: Payer: Self-pay | Admitting: Internal Medicine

## 2011-09-20 ENCOUNTER — Ambulatory Visit (INDEPENDENT_AMBULATORY_CARE_PROVIDER_SITE_OTHER): Payer: Self-pay | Admitting: Pharmacist

## 2011-09-20 DIAGNOSIS — I82409 Acute embolism and thrombosis of unspecified deep veins of unspecified lower extremity: Secondary | ICD-10-CM

## 2011-09-20 DIAGNOSIS — Z7901 Long term (current) use of anticoagulants: Secondary | ICD-10-CM

## 2011-09-20 LAB — POCT INR

## 2011-11-03 ENCOUNTER — Ambulatory Visit (INDEPENDENT_AMBULATORY_CARE_PROVIDER_SITE_OTHER): Payer: Self-pay | Admitting: *Deleted

## 2011-11-03 DIAGNOSIS — Z7901 Long term (current) use of anticoagulants: Secondary | ICD-10-CM

## 2011-11-03 DIAGNOSIS — I82409 Acute embolism and thrombosis of unspecified deep veins of unspecified lower extremity: Secondary | ICD-10-CM

## 2011-11-03 LAB — POCT INR: INR: 2.9

## 2011-11-15 ENCOUNTER — Other Ambulatory Visit: Payer: Self-pay | Admitting: Internal Medicine

## 2011-11-15 DIAGNOSIS — Z1231 Encounter for screening mammogram for malignant neoplasm of breast: Secondary | ICD-10-CM

## 2011-11-15 LAB — HM MAMMOGRAPHY: HM Mammogram: NEGATIVE

## 2011-12-05 ENCOUNTER — Encounter: Payer: Self-pay | Admitting: Internal Medicine

## 2011-12-05 ENCOUNTER — Other Ambulatory Visit (INDEPENDENT_AMBULATORY_CARE_PROVIDER_SITE_OTHER): Payer: No Typology Code available for payment source

## 2011-12-05 ENCOUNTER — Ambulatory Visit (INDEPENDENT_AMBULATORY_CARE_PROVIDER_SITE_OTHER): Payer: No Typology Code available for payment source | Admitting: Internal Medicine

## 2011-12-05 ENCOUNTER — Ambulatory Visit (INDEPENDENT_AMBULATORY_CARE_PROVIDER_SITE_OTHER)
Admission: RE | Admit: 2011-12-05 | Discharge: 2011-12-05 | Disposition: A | Discharge status: Home / Self Care | Payer: No Typology Code available for payment source | Source: Ambulatory Visit | Attending: Internal Medicine | Admitting: Internal Medicine

## 2011-12-05 ENCOUNTER — Ambulatory Visit
Admission: RE | Admit: 2011-12-05 | Discharge: 2011-12-05 | Disposition: A | Discharge status: Home / Self Care | Payer: No Typology Code available for payment source | Source: Ambulatory Visit | Attending: Internal Medicine | Admitting: Internal Medicine

## 2011-12-05 VITALS — BP 134/84 | HR 71 | Temp 97.7°F | Ht 65.5 in | Wt 223.4 lb

## 2011-12-05 DIAGNOSIS — Z0000 Encounter for general adult medical examination without abnormal findings: Secondary | ICD-10-CM

## 2011-12-05 DIAGNOSIS — R0989 Other specified symptoms and signs involving the circulatory and respiratory systems: Secondary | ICD-10-CM

## 2011-12-05 DIAGNOSIS — R0600 Dyspnea, unspecified: Secondary | ICD-10-CM | POA: Insufficient documentation

## 2011-12-05 DIAGNOSIS — Z1231 Encounter for screening mammogram for malignant neoplasm of breast: Secondary | ICD-10-CM

## 2011-12-05 LAB — TSH: TSH: 0.32 u[IU]/mL — ABNORMAL LOW (ref 0.35–5.50)

## 2011-12-05 LAB — URINALYSIS, ROUTINE W REFLEX MICROSCOPIC
Ketones, ur: NEGATIVE
Nitrite: NEGATIVE
Specific Gravity, Urine: 1.015 (ref 1.000–1.030)
Urobilinogen, UA: 0.2 (ref 0.0–1.0)

## 2011-12-05 LAB — CBC WITH DIFFERENTIAL/PLATELET
Basophils Absolute: 0 10*3/uL (ref 0.0–0.1)
HCT: 33.6 % — ABNORMAL LOW (ref 36.0–46.0)
Lymphocytes Relative: 24.9 % (ref 12.0–46.0)
Lymphs Abs: 1.9 10*3/uL (ref 0.7–4.0)
Monocytes Relative: 5.5 % (ref 3.0–12.0)
Neutrophils Relative %: 67.4 % (ref 43.0–77.0)
Platelets: 428 10*3/uL — ABNORMAL HIGH (ref 150.0–400.0)
RDW: 17.8 % — ABNORMAL HIGH (ref 11.5–14.6)
WBC: 7.8 10*3/uL (ref 4.5–10.5)

## 2011-12-05 LAB — BASIC METABOLIC PANEL
CO2: 24 mEq/L (ref 19–32)
Calcium: 9.1 mg/dL (ref 8.4–10.5)
Creatinine, Ser: 0.8 mg/dL (ref 0.4–1.2)
GFR: 91.54 mL/min (ref >60.00)
Glucose, Bld: 86 mg/dL (ref 70–99)

## 2011-12-05 LAB — LIPID PANEL
HDL: 41.9 mg/dL (ref >39.00)
Total CHOL/HDL Ratio: 4
Triglycerides: 159 mg/dL — ABNORMAL HIGH (ref 0.0–149.0)
VLDL: 31.8 mg/dL (ref 0.0–40.0)

## 2011-12-05 LAB — HEPATIC FUNCTION PANEL
Albumin: 3.6 g/dL (ref 3.5–5.2)
Bilirubin, Direct: 0.1 mg/dL (ref 0.0–0.3)
Total Protein: 8.3 g/dL (ref 6.0–8.3)

## 2011-12-05 MED ORDER — PREDNISONE 10 MG PO TABS: ORAL_TABLET | ORAL | Status: DC

## 2011-12-05 MED ORDER — METHYLPREDNISOLONE ACETATE 80 MG/ML IJ SUSP
120.0000 mg | Freq: Once | INTRAMUSCULAR | Status: AC
  Administered 2011-12-05: 120 mg via INTRAMUSCULAR

## 2011-12-05 MED ORDER — FLUTICASONE-SALMETEROL 115-21 MCG/ACT IN AERO: 2.0000 | INHALATION_SPRAY | Freq: Two times a day (BID) | RESPIRATORY_TRACT | Status: DC

## 2011-12-05 NOTE — Assessment & Plan Note (Addendum)
?   Asthma/allergy related - ECG reviewed as per emr, for depomedrol IM, predpack trial, and trial advair 115/21 bid, also for labs today and echo, also cxr

## 2011-12-05 NOTE — Assessment & Plan Note (Signed)

## 2011-12-05 NOTE — Patient Instructions (Addendum)
Please remember to followup with your GYN for the yearly pap smear and/or mammogram as you do You are due for colonoscopy about June 2014 according to the records today You are otherwise up to date with prevention You had the steroid shot today Your EKG was ok today You will be contacted regarding the referral for: echocardiogram (painless heart ultrasound test) Please go to LAB in the Basement for the blood and/or urine tests to be done today Please go to XRAY in the Basement for the x-ray test You will be contacted by phone if any changes need to be made immediately.  Otherwise, you will receive a letter about your results with an explanation. Take all new medications as prescribed - the inhaler, and the prednisone Continue all other medications as before Please have the pharmacy call with any refills you may need. Please return in 6 months, or sooner if needed

## 2011-12-06 LAB — IBC PANEL
Iron: 37 ug/dL — ABNORMAL LOW (ref 42–145)
Saturation Ratios: 9.4 % — ABNORMAL LOW (ref 20.0–50.0)

## 2011-12-06 LAB — VITAMIN B12: Vitamin B-12: 370 pg/mL (ref 211–911)

## 2011-12-07 ENCOUNTER — Encounter: Payer: Self-pay | Admitting: Internal Medicine

## 2011-12-07 ENCOUNTER — Other Ambulatory Visit: Payer: Self-pay | Admitting: Internal Medicine

## 2011-12-07 DIAGNOSIS — D509 Iron deficiency anemia, unspecified: Secondary | ICD-10-CM

## 2011-12-07 HISTORY — DX: Iron deficiency anemia, unspecified: D50.9

## 2011-12-08 ENCOUNTER — Other Ambulatory Visit (HOSPITAL_COMMUNITY): Payer: No Typology Code available for payment source

## 2011-12-10 ENCOUNTER — Encounter: Payer: Self-pay | Admitting: Internal Medicine

## 2011-12-10 NOTE — Progress Notes (Signed)
Subjective:    Patient ID: Brianna Walker, female    DOB: 09/13/1945, 66 y.o.   MRN: [id number]  HPI  Here for wellness and f/u;  Overall doing ok;  Pt denies CP, orthopnea, PND, worsening LE edema, palpitations, dizziness or syncope.  Has had new onset 2-3 mo DOE most noticeable at walking up one flight of stairs at home that she has done easily before, lived there for 5 yrs, has to stop for a couple of minutes due to fatigue and weakness. Has some difficulty taking a deep breath, ? Mild wheezing, has ongoing mild nasal allergy symtpoms as well, nasal steroid recently not working as well.   Pt denies neurological change such as new Headache, facial or extremity weakness.  Pt denies polydipsia, polyuria, or low sugar symptoms. Pt states overall good compliance with treatment and medications, good tolerability, and trying to follow lower cholesterol diet.  Pt denies worsening depressive symptoms, suicidal ideation or panic. No fever, wt loss, night sweats, loss of appetite, or other constitutional symptoms.  Pt states good ability with ADL's, low fall risk, home safety reviewed and adequate, no significant changes in hearing or vision, and occasionally active with exercise.   Past Medical History  Diagnosis Date  . ALLERGIC RHINITIS 09/12/2007  . BACK PAIN 06/19/2008  . Cramp of limb 08/11/2009  . DEEP VENOUS THROMBOPHLEBITIS, LEG, RIGHT 03/04/2010  . Encounter for long-term (current) use of anticoagulants 11/29/2010  . FREQUENCY, URINARY 10/07/2009  . GERD 12/05/2006  . HEMORRHOIDS 11/17/2006  . HIP PAIN, RIGHT, CHRONIC 08/11/2009  . HX, PERSONAL, TUBERCULOSIS 11/17/2006  . HYPERLIPIDEMIA 09/12/2007  . HYPERTENSION 11/17/2006  . INSOMNIA-SLEEP DISORDER-UNSPEC 06/19/2008  . LEG PAIN, RIGHT 06/19/2008  . MASS, SUPERFICIAL 09/12/2007  . MENOPAUSAL DISORDER 09/20/2009  . OVERACTIVE BLADDER 03/04/2010  . PULMONARY EMBOLISM, HX OF 11/17/2006  . RECTAL BLEEDING 10/07/2007  . SCIATICA, RIGHT 09/12/2007  . SKIN LESION  07/20/2009  . TMJ SYNDROME 11/17/2006  . Iron deficiency anemia 12/07/2011   Past Surgical History  Procedure Date  . Cholecystectomy   . Abdominal hysterectomy   . Inguinal herniorrhapy     multiple ventral hernia repair/mesh  . Tumor removed off of right shoulder   . S/p right hip replaced min invasive hip surgury [facility] oct 2011     reports that she has never smoked. She does not have any smokeless tobacco history on file. She reports that she does not drink alcohol or use illicit drugs. family history is not on file. No Known Allergies Current Outpatient Prescriptions on File Prior to Visit  Medication Sig Dispense Refill  . aspirin 81 MG tablet Take 81 mg by mouth daily.        . benazepril (LOTENSIN) 40 MG tablet Take 1 tablet (40 mg total) by mouth daily.  90 tablet  3  . clotrimazole-betamethasone (LOTRISONE) cream Use as directed to affected area twice per day as needed  15 g  1  . mometasone (NASONEX) 50 MCG/ACT nasal spray Place 2 sprays into the nose daily.  17 g  2  . omeprazole (PRILOSEC) 20 MG capsule Take 1 capsule (20 mg total) by mouth 2 (two) times daily.  60 capsule  11  . pravastatin (PRAVACHOL) 20 MG tablet Take 1 tablet (20 mg total) by mouth every evening.  90 tablet  3  . warfarin (COUMADIN) 5 MG tablet TAKE ONE TABLET BY MOUTH AS DIRECTED EVERY DAY  120 tablet  3  . fluticasone-salmeterol (ADVAIR HFA) 115-21  MCG/ACT inhaler Inhale 2 puffs into the lungs 2 (two) times daily.  1 Inhaler  12   Review of Systems Review of Systems  Constitutional: Negative for diaphoresis, activity change, appetite change and unexpected weight change.  HENT: Negative for hearing loss, ear pain, facial swelling, mouth sores and neck stiffness.   Eyes: Negative for pain, redness and visual disturbance.  Respiratory: Negative for cough Cardiovascular: Negative for chest pain and palpitations.  Gastrointestinal: Negative for diarrhea, blood in stool, abdominal distention and rectal  pain.  Genitourinary: Negative for hematuria, flank pain and decreased urine volume.  Musculoskeletal: Negative for myalgias and joint swelling.  Skin: Negative for color change and wound.  Neurological: Negative for syncope and numbness.  Hematological: Negative for adenopathy.  Psychiatric/Behavioral: Negative for hallucinations, self-injury, decreased concentration and agitation.     Objective:   Physical Exam BP 134/84  Pulse 71  Temp 97.7 F (36.5 C) (Oral)  Ht 5' 5.5" (1.664 m)  Wt 223 lb 6 oz (101.322 kg)  BMI 36.61 kg/m2  SpO2 97% Physical Exam  VS noted Constitutional: Pt is oriented to person, place, and time. Appears well-developed and well-nourished.  HENT:  Head: Normocephalic and atraumatic.  Right Ear: External ear normal.  Left Ear: External ear normal.  Nose: Nose normal.  Mouth/Throat: Oropharynx is clear and moist.  Bilat tm's mild erythema.  Sinus nontender.  Pharynx mild erythema Eyes: Conjunctivae and EOM are normal. Pupils are equal, round, and reactive to light.  Neck: Normal range of motion. Neck supple. No JVD present. No tracheal deviation present.  Cardiovascular: Normal rate, regular rhythm, normal heart sounds and intact distal pulses.   Pulmonary/Chest: Effort normal and breath sounds mild decreased, trace wheezes  Abdominal: Soft. Bowel sounds are normal. There is no tenderness.  Musculoskeletal: Normal range of motion. Exhibits no edema.  Lymphadenopathy:  Has no cervical adenopathy.  Neurological: Pt is alert and oriented to person, place, and time. Pt has normal reflexes. No cranial nerve deficit. Motor/gait intact Skin: Skin is warm and dry. No rash noted.  Psychiatric:  Has  normal mood and affect. Behavior is normal.     Assessment & Plan:

## 2011-12-11 ENCOUNTER — Other Ambulatory Visit: Payer: Self-pay | Admitting: Internal Medicine

## 2011-12-11 DIAGNOSIS — R928 Other abnormal and inconclusive findings on diagnostic imaging of breast: Secondary | ICD-10-CM

## 2011-12-13 ENCOUNTER — Ambulatory Visit (INDEPENDENT_AMBULATORY_CARE_PROVIDER_SITE_OTHER): Payer: No Typology Code available for payment source | Admitting: Pharmacist

## 2011-12-13 DIAGNOSIS — I82409 Acute embolism and thrombosis of unspecified deep veins of unspecified lower extremity: Secondary | ICD-10-CM

## 2011-12-13 DIAGNOSIS — Z7901 Long term (current) use of anticoagulants: Secondary | ICD-10-CM

## 2011-12-13 LAB — POCT INR: INR: 2.7

## 2012-03-26 ENCOUNTER — Other Ambulatory Visit: Payer: Self-pay | Admitting: Internal Medicine

## 2012-05-17 ENCOUNTER — Ambulatory Visit: Payer: No Typology Code available for payment source | Admitting: Internal Medicine

## 2012-05-21 ENCOUNTER — Ambulatory Visit: Payer: No Typology Code available for payment source | Admitting: Internal Medicine

## 2012-05-28 ENCOUNTER — Encounter: Payer: Self-pay | Admitting: Internal Medicine

## 2012-05-28 ENCOUNTER — Ambulatory Visit (INDEPENDENT_AMBULATORY_CARE_PROVIDER_SITE_OTHER): Payer: BC Managed Care – PPO | Admitting: General Practice

## 2012-05-28 ENCOUNTER — Ambulatory Visit (INDEPENDENT_AMBULATORY_CARE_PROVIDER_SITE_OTHER): Payer: BC Managed Care – PPO | Admitting: Internal Medicine

## 2012-05-28 ENCOUNTER — Other Ambulatory Visit (INDEPENDENT_AMBULATORY_CARE_PROVIDER_SITE_OTHER): Payer: BC Managed Care – PPO

## 2012-05-28 VITALS — BP 140/88 | HR 70 | Temp 97.7°F | Ht 65.0 in | Wt 223.2 lb

## 2012-05-28 DIAGNOSIS — I82409 Acute embolism and thrombosis of unspecified deep veins of unspecified lower extremity: Secondary | ICD-10-CM

## 2012-05-28 DIAGNOSIS — R0989 Other specified symptoms and signs involving the circulatory and respiratory systems: Secondary | ICD-10-CM

## 2012-05-28 DIAGNOSIS — I10 Essential (primary) hypertension: Secondary | ICD-10-CM

## 2012-05-28 DIAGNOSIS — D509 Iron deficiency anemia, unspecified: Secondary | ICD-10-CM

## 2012-05-28 DIAGNOSIS — I2699 Other pulmonary embolism without acute cor pulmonale: Secondary | ICD-10-CM

## 2012-05-28 DIAGNOSIS — Z7901 Long term (current) use of anticoagulants: Secondary | ICD-10-CM

## 2012-05-28 DIAGNOSIS — E785 Hyperlipidemia, unspecified: Secondary | ICD-10-CM

## 2012-05-28 DIAGNOSIS — K921 Melena: Secondary | ICD-10-CM

## 2012-05-28 DIAGNOSIS — G459 Transient cerebral ischemic attack, unspecified: Secondary | ICD-10-CM

## 2012-05-28 DIAGNOSIS — R0600 Dyspnea, unspecified: Secondary | ICD-10-CM

## 2012-05-28 LAB — BASIC METABOLIC PANEL
BUN: 10 mg/dL (ref 6–23)
Chloride: 108 mEq/L (ref 96–112)
Glucose, Bld: 97 mg/dL (ref 70–99)
Potassium: 3.7 mEq/L (ref 3.5–5.1)
Sodium: 140 mEq/L (ref 135–145)

## 2012-05-28 LAB — CBC WITH DIFFERENTIAL/PLATELET
Basophils Absolute: 0 10*3/uL (ref 0.0–0.1)
Eosinophils Absolute: 0.1 10*3/uL (ref 0.0–0.7)
HCT: 35.1 % — ABNORMAL LOW (ref 36.0–46.0)
Lymphs Abs: 1.7 10*3/uL (ref 0.7–4.0)
MCHC: 32.5 g/dL (ref 30.0–36.0)
MCV: 74.6 fl — ABNORMAL LOW (ref 78.0–100.0)
Monocytes Absolute: 0.4 10*3/uL (ref 0.1–1.0)
Neutro Abs: 5.1 10*3/uL (ref 1.4–7.7)
Platelets: 346 10*3/uL (ref 150.0–400.0)
RDW: 17.9 % — ABNORMAL HIGH (ref 11.5–14.6)

## 2012-05-28 LAB — HEPATIC FUNCTION PANEL
ALT: 12 U/L (ref 0–35)
AST: 16 U/L (ref 0–37)
Albumin: 3.3 g/dL — ABNORMAL LOW (ref 3.5–5.2)
Alkaline Phosphatase: 106 U/L (ref 39–117)

## 2012-05-28 LAB — TSH: TSH: 0.35 u[IU]/mL (ref 0.35–5.50)

## 2012-05-28 LAB — LIPID PANEL: Cholesterol: 142 mg/dL (ref 0–200)

## 2012-05-28 LAB — IBC PANEL
Saturation Ratios: 11.4 % — ABNORMAL LOW (ref 20.0–50.0)
Transferrin: 250.6 mg/dL (ref 212.0–360.0)

## 2012-05-28 LAB — POCT INR: INR: 2.9

## 2012-05-28 MED ORDER — FERROUS SULFATE 325 (65 FE) MG PO TABS: 325.0000 mg | ORAL_TABLET | Freq: Two times a day (BID) | ORAL | Status: DC

## 2012-05-28 MED ORDER — FLUTICASONE-SALMETEROL 115-21 MCG/ACT IN AERO: 2.0000 | INHALATION_SPRAY | Freq: Two times a day (BID) | RESPIRATORY_TRACT | Status: DC

## 2012-05-28 NOTE — Progress Notes (Signed)
Subjective:    Patient ID: Brianna Walker, female    DOB: 02-10-1946, 67 y.o.   MRN: [id number]  HPI  Here to f/u with c/o dyspnea, intermittent, mild to mod, worse to cold weather and exertion, sometimes has to stop for breath going up one flight of stairs, not taking her asthma inhalers, did seem to help when has similar symptoms prior to inhaler use, no overt bleeding or bruising, has not f/u'd with GI as planned after finding new iron def anemia 6 mo ago, has had 2-3 episodes since then BRBPR "watery like" self limited since then.  Thinks she may have hemorrhoidal bleeding, has not sought evaluation.  Has some ongoing fatigue without daytime somnolence, also ongoing obesity.  Denies night time snoring or morning headaches  Last colonoscopy 2009 per Dr Russella Dar with diverticulosis and polyps. Pt denies chest pain, overt wheezing, orthopnea, PND, increased LE swelling, palpitations, or syncope.   Past Medical History  Diagnosis Date  . ALLERGIC RHINITIS 09/12/2007  . BACK PAIN 06/19/2008  . Cramp of limb 08/11/2009  . DEEP VENOUS THROMBOPHLEBITIS, LEG, RIGHT 03/04/2010  . Long term (current) use of anticoagulants 11/29/2010  . FREQUENCY, URINARY 10/07/2009  . GERD 12/05/2006  . HEMORRHOIDS 11/17/2006  . HIP PAIN, RIGHT, CHRONIC 08/11/2009  . HX, PERSONAL, TUBERCULOSIS 11/17/2006  . HYPERLIPIDEMIA 09/12/2007  . HYPERTENSION 11/17/2006  . INSOMNIA-SLEEP DISORDER-UNSPEC 06/19/2008  . LEG PAIN, RIGHT 06/19/2008  . MASS, SUPERFICIAL 09/12/2007  . MENOPAUSAL DISORDER 09/20/2009  . OVERACTIVE BLADDER 03/04/2010  . PULMONARY EMBOLISM, HX OF 11/17/2006  . RECTAL BLEEDING 10/07/2007  . SCIATICA, RIGHT 09/12/2007  . SKIN LESION 07/20/2009  . TMJ SYNDROME 11/17/2006  . Iron deficiency anemia 12/07/2011   Past Surgical History  Procedure Date  . Cholecystectomy   . Abdominal hysterectomy   . Inguinal herniorrhapy     multiple ventral hernia repair/mesh  . Tumor removed off of right shoulder   . S/p right hip replaced  min invasive hip surgury [facility] oct 2011     reports that she has never smoked. She does not have any smokeless tobacco history on file. She reports that she does not drink alcohol or use illicit drugs. family history is not on file. No Known Allergies Current Outpatient Prescriptions on File Prior to Visit  Medication Sig Dispense Refill  . aspirin 81 MG tablet Take 81 mg by mouth daily.        . benazepril (LOTENSIN) 40 MG tablet TAKE ONE TABLET BY MOUTH EVERY DAY  90 tablet  2  . pravastatin (PRAVACHOL) 20 MG tablet TAKE ONE TABLET BY MOUTH IN THE EVENING  90 tablet  2  . warfarin (COUMADIN) 5 MG tablet TAKE ONE TABLET BY MOUTH AS DIRECTED EVERY DAY  120 tablet  3  . ferrous sulfate 325 (65 FE) MG tablet Take 1 tablet (325 mg total) by mouth 2 (two) times daily.  180 tablet  3  . omeprazole (PRILOSEC) 20 MG capsule Take 1 capsule (20 mg total) by mouth 2 (two) times daily.  60 capsule  11   Review of Systems  Constitutional: Negative for unexpected weight change, or unusual diaphoresis  HENT: Negative for tinnitus.   Eyes: Negative for photophobia and visual disturbance.  Respiratory: Negative for choking and stridor.   Gastrointestinal: Negative for vomiting Genitourinary: Negative for hematuria and decreased urine volume.  Musculoskeletal: Negative for acute joint swelling Skin: Negative for color change and wound.  Neurological: Negative for tremors and numbness other than  noted  Psychiatric/Behavioral: Negative for decreased concentration or  hyperactivity.       Objective:   Physical Exam BP 140/88  Pulse 70  Temp 97.7 F (36.5 C) (Oral)  Ht 5\' 5"  (1.651 m)  Wt 223 lb 4 oz (101.266 kg)  BMI 37.15 kg/m2  SpO2 98% VS noted,  Constitutional: Pt appears well-developed and well-nourished.  HENT: Head: NCAT.  Right Ear: External ear normal.  Left Ear: External ear normal.  Eyes: Conjunctivae and EOM are normal. Pupils are equal, round, and reactive to light.  Neck:  Normal range of motion. Neck supple.  Cardiovascular: Normal rate and regular rhythm.   Pulmonary/Chest: Effort normal and breath sounds normal - no rales or wheezing.  Abd:  Soft, NT, non-distended, + BS Neurological: Pt is alert. Not confused  Skin: Skin is warm. No erythema.  Psychiatric: Pt behavior is normal. Thought content normal.     Assessment & Plan:

## 2012-05-28 NOTE — Patient Instructions (Addendum)
I believe part of your shortness of breath with stairs, and fatigue are most likely related to worsening iron deficiency anemia, and possibly also the asthma Please re-start the Advair as directed to see if this helps the asthma part Please also see Brianna Walker at our Coumadin clinic before leaving the office today for your coumadin check, and you should plan on seeing Brianna Asp on a regular basis after that Please start iron sulfate 325 mg  - twice per day (you may also wish to start colace 100 mg up to twice per day as needed for constipation as well) You will be contacted regarding the referral for: Gastroenterology - Dr Brianna Walker Please go to the LAB in the Basement (turn left off the elevator) for the tests to be done today You will be contacted by phone if any changes need to be made immediately.  Otherwise, you will receive a letter about your results with an explanation, but please check with MyChart first. Thank you for enrolling in MyChart. Please follow the instructions below to securely access your online medical record. MyChart allows you to send messages to your doctor, view your test results, renew your prescriptions, schedule appointments, and more To Log into My Chart online, please go by [facility] or [employer] to [email or web address], or download the MyChart App from the [employer] .  Your Username is: Brianna Walker  (pass  [email or web address]) Please send a practice Message on Mychart later today. Please return in 1 months, or sooner if needed

## 2012-05-28 NOTE — Assessment & Plan Note (Signed)
stable overall by history and exam, recent data reviewed with pt, and pt to continue medical treatment as before,  to f/u any worsening symptoms or concerns BP Readings from Last 3 Encounters:  05/28/12 140/88  12/05/11 134/84  08/14/11 124/70

## 2012-05-28 NOTE — Assessment & Plan Note (Addendum)
Exact etiology unclear, cxr 6 mo ago reviewed no acute;  ? Asthma related, obesity, anemia vs other? - for inhaler re-start/advair,  to f/u any worsening symptoms or concerns, work on wt loss, f/u cbc  Note:  Total time for pt hx, exam, review of record with pt in the room, determination of diagnoses and plan for further eval and tx is > 40 min, with over 50% spent in coordination and counseling of patient

## 2012-05-28 NOTE — Assessment & Plan Note (Signed)
For f/u labs today,  to f/u any worsening symptoms or concerns Lab Results  Component Value Date   WBC 7.3 05/28/2012   HGB 11.4* 05/28/2012   HCT 35.1* 05/28/2012   MCV 74.6* 05/28/2012   PLT 346.0 05/28/2012

## 2012-05-28 NOTE — Assessment & Plan Note (Signed)
For GI referall, ? Diverticular, may need colonoscopy, egd

## 2012-06-15 ENCOUNTER — Other Ambulatory Visit: Payer: Self-pay

## 2012-06-21 ENCOUNTER — Ambulatory Visit: Payer: BC Managed Care – PPO | Admitting: Gastroenterology

## 2012-06-28 ENCOUNTER — Ambulatory Visit: Payer: BC Managed Care – PPO | Admitting: Internal Medicine

## 2012-08-08 ENCOUNTER — Other Ambulatory Visit: Payer: Self-pay | Admitting: Internal Medicine

## 2012-08-08 ENCOUNTER — Other Ambulatory Visit: Payer: Self-pay | Admitting: General Practice

## 2012-08-08 MED ORDER — WARFARIN SODIUM 5 MG PO TABS: 5.0000 mg | ORAL_TABLET | Freq: Every day | ORAL | Status: DC

## 2012-08-09 ENCOUNTER — Ambulatory Visit: Payer: BC Managed Care – PPO | Admitting: Internal Medicine

## 2012-08-12 ENCOUNTER — Encounter: Payer: Self-pay | Admitting: Internal Medicine

## 2012-08-12 ENCOUNTER — Other Ambulatory Visit (INDEPENDENT_AMBULATORY_CARE_PROVIDER_SITE_OTHER): Payer: BC Managed Care – PPO

## 2012-08-12 ENCOUNTER — Ambulatory Visit (INDEPENDENT_AMBULATORY_CARE_PROVIDER_SITE_OTHER): Payer: BC Managed Care – PPO | Admitting: Internal Medicine

## 2012-08-12 VITALS — BP 130/72 | HR 75 | Temp 98.1°F | Ht 65.5 in | Wt 227.0 lb

## 2012-08-12 DIAGNOSIS — D509 Iron deficiency anemia, unspecified: Secondary | ICD-10-CM

## 2012-08-12 DIAGNOSIS — Z0000 Encounter for general adult medical examination without abnormal findings: Secondary | ICD-10-CM

## 2012-08-12 DIAGNOSIS — M549 Dorsalgia, unspecified: Secondary | ICD-10-CM

## 2012-08-12 DIAGNOSIS — K149 Disease of tongue, unspecified: Secondary | ICD-10-CM

## 2012-08-12 DIAGNOSIS — E669 Obesity, unspecified: Secondary | ICD-10-CM

## 2012-08-12 LAB — BASIC METABOLIC PANEL
BUN: 16 mg/dL (ref 6–23)
Calcium: 9.2 mg/dL (ref 8.4–10.5)
Creatinine, Ser: 1 mg/dL (ref 0.4–1.2)
GFR: 70.01 mL/min (ref >60.00)

## 2012-08-12 LAB — CBC WITH DIFFERENTIAL/PLATELET
Basophils Relative: 0.5 % (ref 0.0–3.0)
HCT: 38.7 % (ref 36.0–46.0)
Hemoglobin: 12.9 g/dL (ref 12.0–15.0)
Lymphocytes Relative: 22.7 % (ref 12.0–46.0)
MCHC: 33.3 g/dL (ref 30.0–36.0)
Monocytes Relative: 4.5 % (ref 3.0–12.0)
Neutro Abs: 6.2 10*3/uL (ref 1.4–7.7)
RBC: 5 Mil/uL (ref 3.87–5.11)

## 2012-08-12 LAB — URINALYSIS, ROUTINE W REFLEX MICROSCOPIC
Ketones, ur: NEGATIVE
Specific Gravity, Urine: 1.015 (ref 1.000–1.030)
Urine Glucose: NEGATIVE
Urobilinogen, UA: 0.2 (ref 0.0–1.0)

## 2012-08-12 LAB — LIPID PANEL
Cholesterol: 176 mg/dL (ref 0–200)
HDL: 36.8 mg/dL — ABNORMAL LOW (ref >39.00)
VLDL: 43.8 mg/dL — ABNORMAL HIGH (ref 0.0–40.0)

## 2012-08-12 LAB — IBC PANEL: Saturation Ratios: 22.8 % (ref 20.0–50.0)

## 2012-08-12 LAB — HEPATIC FUNCTION PANEL: Total Bilirubin: 0.6 mg/dL (ref 0.3–1.2)

## 2012-08-12 MED ORDER — PHENTERMINE HCL 37.5 MG PO CAPS: 37.5000 mg | ORAL_CAPSULE | ORAL | Status: DC

## 2012-08-12 NOTE — Patient Instructions (Signed)
Please take all new medication as prescribed - the phentermine Please continue all other medications as before Please keep your appointments with your specialists as you have planned Please call for orthopedic referral if the hip pain is worse Please go to the LAB in the Basement (turn left off the elevator) for the tests to be done today You will be contacted by phone if any changes need to be made immediately.  Otherwise, you will receive a letter about your results with an explanation, but please check with MyChart first. Thank you for enrolling in MyChart. Please follow the instructions below to securely access your online medical record. MyChart allows you to send messages to your doctor, view your test results, renew your prescriptions, schedule appointments, and more Please return in 6 months, or sooner if needed, with Lab testing done 3-5 days before

## 2012-08-12 NOTE — Progress Notes (Signed)
Subjective:    Patient ID: Brianna Walker, female    DOB: Mar 24, 1946, 67 y.o.   MRN: [id number]  HPI  Here to f/u; overall doing ok,  Pt denies chest pain, increased sob or doe, wheezing, orthopnea, PND, increased LE swelling, palpitations, dizziness or syncope.  Pt denies polydipsia, polyuria, .  Pt denies new neurological symptoms such as new headache, or facial or extremity weakness or numbness.   Pt states overall good compliance with meds, with wt overall stable,  but little exercise however.  On coumadin, and for some reason (? Spicy food vs teeth) gets tip of tongue bleeding  rather signficant, two episodes in the last mo without trauma, or tongue lesion.  States has ? Memory loss but more anxious recently.  Unable to lose wt,  No overt other bleeding or bruising.   Pt continues to have recurring LBP without change in severity, bowel or bladder change, fever, wt loss,  worsening LE pain/numbness/weakness, gait change or falls. Past Medical History  Diagnosis Date  . ALLERGIC RHINITIS 09/12/2007  . BACK PAIN 06/19/2008  . Cramp of limb 08/11/2009  . DEEP VENOUS THROMBOPHLEBITIS, LEG, RIGHT 03/04/2010  . Long term (current) use of anticoagulants 11/29/2010  . FREQUENCY, URINARY 10/07/2009  . GERD 12/05/2006  . HEMORRHOIDS 11/17/2006  . HIP PAIN, RIGHT, CHRONIC 08/11/2009  . HX, PERSONAL, TUBERCULOSIS 11/17/2006  . HYPERLIPIDEMIA 09/12/2007  . HYPERTENSION 11/17/2006  . INSOMNIA-SLEEP DISORDER-UNSPEC 06/19/2008  . LEG PAIN, RIGHT 06/19/2008  . MASS, SUPERFICIAL 09/12/2007  . MENOPAUSAL DISORDER 09/20/2009  . OVERACTIVE BLADDER 03/04/2010  . PULMONARY EMBOLISM, HX OF 11/17/2006  . RECTAL BLEEDING 10/07/2007  . SCIATICA, RIGHT 09/12/2007  . SKIN LESION 07/20/2009  . TMJ SYNDROME 11/17/2006  . Iron deficiency anemia 12/07/2011  . Diverticulosis    Past Surgical History  Procedure Laterality Date  . Cholecystectomy    . Abdominal hysterectomy    . Inguinal herniorrhapy      multiple ventral hernia  repair/mesh  . Tumor removed off of right shoulder    . S/p right hip replaced min invasive hip surg[facility] oct 2011      reports that she has never smoked. She does not have any smokeless tobacco history on file. She reports that she does not drink alcohol or use illicit drugs. family history includes Colon cancer in her father. No Known Allergies Current Outpatient Prescriptions on File Prior to Visit  Medication Sig Dispense Refill  . aspirin 81 MG tablet Take 81 mg by mouth daily.        . benazepril (LOTENSIN) 40 MG tablet TAKE ONE TABLET BY MOUTH EVERY DAY  90 tablet  2  . ferrous sulfate 325 (65 FE) MG tablet Take 1 tablet (325 mg total) by mouth 2 (two) times daily.  180 tablet  3  . fluticasone-salmeterol (ADVAIR HFA) 115-21 MCG/ACT inhaler Inhale 2 puffs into the lungs 2 (two) times daily.  1 Inhaler  12  . pravastatin (PRAVACHOL) 20 MG tablet TAKE ONE TABLET BY MOUTH IN THE EVENING  90 tablet  2  . warfarin (COUMADIN) 5 MG tablet Take 1 tablet (5 mg total) by mouth daily. Take as directed by coumadin clinic.  120 tablet  0  . omeprazole (PRILOSEC) 20 MG capsule Take 1 capsule (20 mg total) by mouth 2 (two) times daily.  60 capsule  11   No current facility-administered medications on file prior to visit.   Review of Systems  Constitutional: Negative for unexpected weight  change, or unusual diaphoresis  HENT: Negative for tinnitus.   Eyes: Negative for photophobia and visual disturbance.  Respiratory: Negative for choking and stridor.   Gastrointestinal: Negative for vomiting and blood in stool.  Genitourinary: Negative for hematuria and decreased urine volume.  Musculoskeletal: Negative for acute joint swelling Skin: Negative for color change and wound.  Neurological: Negative for tremors and numbness other than noted  Psychiatric/Behavioral: Negative for decreased concentration or  hyperactivity.        Objective:   Physical Exam BP 130/72  Pulse 75  Temp(Src)  98.1 F (36.7 C) (Oral)  Ht 5' 5.5" (1.664 m)  Wt 227 lb (102.967 kg)  BMI 37.19 kg/m2  SpO2 98% VS noted,  Constitutional: Pt appears well-developed and well-nourished.  HENT: Head: NCAT.  Right Ear: External ear normal.  Left Ear: External ear normal.  Bilat tm's with mild erythema.  Max sinus areas non tender.  Pharynx with mild erythema, no exudate, tongue wihtout lesion or bleeding or rash Eyes: Conjunctivae and EOM are normal. Pupils are equal, round, and reactive to light.  Neck: Normal range of motion. Neck supple.  Cardiovascular: Normal rate and regular rhythm.   Pulmonary/Chest: Effort normal and breath sounds normal.  Abd:  Soft, NT, non-distended, + BS Spine:  Non tender Neurological: Pt is alert. Not confused , motor/dtr intact, gait intact Skin: Skin is warm. No erythema.  Psychiatric: Pt behavior is normal. Thought content normal. 2+ nervous    Assessment & Plan:

## 2012-08-16 DIAGNOSIS — E669 Obesity, unspecified: Secondary | ICD-10-CM | POA: Insufficient documentation

## 2012-08-16 NOTE — Assessment & Plan Note (Signed)
Exam benign, pt resssured,  to f/u any worsening symptoms or concerns

## 2012-08-16 NOTE — Assessment & Plan Note (Addendum)
stable overall by history and exam, recent data reviewed with pt, and pt to continue medical treatment as before,  to f/u any worsening symptoms or concerns, Lab Results  Component Value Date   WBC 8.8 08/12/2012   HGB 12.9 08/12/2012   HCT 38.7 08/12/2012   MCV 77.4* 08/12/2012   PLT 336.0 08/12/2012

## 2012-08-16 NOTE — Assessment & Plan Note (Signed)
For phentermine asd,  to f/u any worsening symptoms or concerns, i lower calories, increased activity as able

## 2012-08-16 NOTE — Assessment & Plan Note (Signed)
Exam benign,  to f/u any worsening symptoms or concerns  

## 2012-09-27 ENCOUNTER — Encounter: Payer: Self-pay | Admitting: Gastroenterology

## 2012-12-06 ENCOUNTER — Encounter: Payer: BC Managed Care – PPO | Admitting: Internal Medicine

## 2012-12-17 ENCOUNTER — Other Ambulatory Visit: Payer: Self-pay | Admitting: Internal Medicine

## 2012-12-17 ENCOUNTER — Encounter: Payer: BC Managed Care – PPO | Admitting: Internal Medicine

## 2012-12-18 ENCOUNTER — Other Ambulatory Visit: Payer: Self-pay | Admitting: Internal Medicine

## 2012-12-19 ENCOUNTER — Other Ambulatory Visit: Payer: Self-pay | Admitting: General Practice

## 2012-12-19 MED ORDER — WARFARIN SODIUM 5 MG PO TABS: ORAL_TABLET | ORAL | Status: DC

## 2012-12-23 ENCOUNTER — Other Ambulatory Visit: Payer: Self-pay | Admitting: Internal Medicine

## 2012-12-25 ENCOUNTER — Telehealth: Payer: Self-pay | Admitting: *Deleted

## 2012-12-25 ENCOUNTER — Telehealth: Payer: Self-pay | Admitting: Internal Medicine

## 2012-12-25 ENCOUNTER — Encounter: Payer: Self-pay | Admitting: *Deleted

## 2012-12-25 MED ORDER — BENAZEPRIL HCL 40 MG PO TABS: 40.0000 mg | ORAL_TABLET | Freq: Every day | ORAL | Status: DC

## 2012-12-25 NOTE — Telephone Encounter (Signed)
Pt wants her refill to go to [facility] on [address].

## 2012-12-25 NOTE — Telephone Encounter (Signed)
LMOM

## 2012-12-25 NOTE — Telephone Encounter (Signed)
Patient informed refill sent in.

## 2012-12-27 ENCOUNTER — Ambulatory Visit (INDEPENDENT_AMBULATORY_CARE_PROVIDER_SITE_OTHER): Payer: Medicare Other | Admitting: Internal Medicine

## 2012-12-27 ENCOUNTER — Ambulatory Visit (INDEPENDENT_AMBULATORY_CARE_PROVIDER_SITE_OTHER): Payer: Medicare Other | Admitting: General Practice

## 2012-12-27 ENCOUNTER — Encounter: Payer: Self-pay | Admitting: Internal Medicine

## 2012-12-27 VITALS — BP 120/80 | HR 80 | Temp 98.6°F | Ht 65.5 in | Wt 213.5 lb

## 2012-12-27 DIAGNOSIS — I82409 Acute embolism and thrombosis of unspecified deep veins of unspecified lower extremity: Secondary | ICD-10-CM

## 2012-12-27 DIAGNOSIS — R079 Chest pain, unspecified: Secondary | ICD-10-CM | POA: Insufficient documentation

## 2012-12-27 DIAGNOSIS — G459 Transient cerebral ischemic attack, unspecified: Secondary | ICD-10-CM

## 2012-12-27 DIAGNOSIS — I10 Essential (primary) hypertension: Secondary | ICD-10-CM

## 2012-12-27 DIAGNOSIS — R0609 Other forms of dyspnea: Secondary | ICD-10-CM

## 2012-12-27 DIAGNOSIS — I2699 Other pulmonary embolism without acute cor pulmonale: Secondary | ICD-10-CM

## 2012-12-27 DIAGNOSIS — Z23 Encounter for immunization: Secondary | ICD-10-CM

## 2012-12-27 DIAGNOSIS — R0600 Dyspnea, unspecified: Secondary | ICD-10-CM

## 2012-12-27 DIAGNOSIS — K219 Gastro-esophageal reflux disease without esophagitis: Secondary | ICD-10-CM

## 2012-12-27 DIAGNOSIS — Z7901 Long term (current) use of anticoagulants: Secondary | ICD-10-CM

## 2012-12-27 LAB — POCT INR: INR: 1.7

## 2012-12-27 MED ORDER — FLUTICASONE-SALMETEROL 115-21 MCG/ACT IN AERO: 2.0000 | INHALATION_SPRAY | Freq: Two times a day (BID) | RESPIRATORY_TRACT | Status: AC

## 2012-12-27 MED ORDER — OMEPRAZOLE 20 MG PO CPDR: 20.0000 mg | DELAYED_RELEASE_CAPSULE | Freq: Every day | ORAL | Status: DC

## 2012-12-27 NOTE — Assessment & Plan Note (Signed)
Ok for re-start PPI - omeprazole 20 qd

## 2012-12-27 NOTE — Progress Notes (Signed)
Subjective:    Patient ID: Brianna Walker, female    DOB: 1945-07-18, 67 y.o.   MRN: [id number]  HPI  Here to f/u, has c/o 2 mo worsening mild to mod sob/doe to ambulation about 89 yrds and up 1-2 flights stairs; has some vague lateral chest discomfort bilaterally with this, all improves with rest, no other diaphoresis, n/v, palp, dizziness.   Has not been using the advair recently due to cost.  Also -  OMeprazole 20 mg otc too expensive.  Denies worsening reflux, abd pain, dysphagia, n/v, bowel change or blood, but med too expensive cash pay  Iron back to normal April 2014, still taking 2 iron per day.  No recent cxr or stress testing.  April 2014 labs reivewed with pt.  Echo 2012 with normal EF Past Medical History  Diagnosis Date  . ALLERGIC RHINITIS 09/12/2007  . BACK PAIN 06/19/2008  . Cramp of limb 08/11/2009  . DEEP VENOUS THROMBOPHLEBITIS, LEG, RIGHT 03/04/2010  . Long term (current) use of anticoagulants 11/29/2010  . FREQUENCY, URINARY 10/07/2009  . GERD 12/05/2006  . HEMORRHOIDS 11/17/2006  . HIP PAIN, RIGHT, CHRONIC 08/11/2009  . HX, PERSONAL, TUBERCULOSIS 11/17/2006  . HYPERLIPIDEMIA 09/12/2007  . HYPERTENSION 11/17/2006  . INSOMNIA-SLEEP DISORDER-UNSPEC 06/19/2008  . LEG PAIN, RIGHT 06/19/2008  . MASS, SUPERFICIAL 09/12/2007  . MENOPAUSAL DISORDER 09/20/2009  . OVERACTIVE BLADDER 03/04/2010  . PULMONARY EMBOLISM, HX OF 11/17/2006  . RECTAL BLEEDING 10/07/2007  . SCIATICA, RIGHT 09/12/2007  . SKIN LESION 07/20/2009  . TMJ SYNDROME 11/17/2006  . Iron deficiency anemia 12/07/2011  . Diverticulosis    Past Surgical History  Procedure Laterality Date  . Cholecystectomy    . Abdominal hysterectomy    . Inguinal herniorrhapy      multiple ventral hernia repair/mesh  . Tumor removed off of right shoulder    . S/p right hip replaced min invasive hip surgury [facility] oct 2011      reports that she has never smoked. She does not have any smokeless tobacco history on file. She reports that she  does not drink alcohol or use illicit drugs. family history includes Colon cancer in her father. No Known Allergies Current Outpatient Prescriptions on File Prior to Visit  Medication Sig Dispense Refill  . aspirin 81 MG tablet Take 81 mg by mouth daily.        . benazepril (LOTENSIN) 40 MG tablet Take 1 tablet (40 mg total) by mouth daily.  90 tablet  3  . ferrous sulfate 325 (65 FE) MG tablet Take 1 tablet (325 mg total) by mouth 2 (two) times daily.  180 tablet  3  . phentermine 37.5 MG capsule Take 1 capsule (37.5 mg total) by mouth every morning.  30 capsule  2  . pravastatin (PRAVACHOL) 20 MG tablet TAKE ONE TABLET BY MOUTH IN THE EVENING  90 tablet  2  . warfarin (COUMADIN) 5 MG tablet Please fill 8 tablets.  Patient must have pt/inr checked before further re-fills.  8 tablet  0   No current facility-administered medications on file prior to visit.    Review of Systems  Constitutional: Negative for unexpected weight change, or unusual diaphoresis  HENT: Negative for tinnitus.   Eyes: Negative for photophobia and visual disturbance.  Respiratory: Negative for choking and stridor.   Gastrointestinal: Negative for vomiting and blood in stool.  Genitourinary: Negative for hematuria and decreased urine volume.  Musculoskeletal: Negative for acute joint swelling Skin: Negative for color change and  wound.  Neurological: Negative for tremors and numbness other than noted  Psychiatric/Behavioral: Negative for decreased concentration or  hyperactivity.       Objective:   Physical Exam BP 120/80  Pulse 80  Temp(Src) 98.6 F (37 C) (Oral)  Ht 5' 5.5" (1.664 m)  Wt 213 lb 8 oz (96.843 kg)  BMI 34.98 kg/m2  SpO2 96% VS noted,  Constitutional: Pt appears well-developed and well-nourished.  HENT: Head: NCAT.  Right Ear: External ear normal.  Left Ear: External ear normal.  Eyes: Conjunctivae and EOM are normal. Pupils are equal, round, and reactive to light.  Neck: Normal range of  motion. Neck supple.  Cardiovascular: Normal rate and regular rhythm.   Pulmonary/Chest: Effort normal and breath sounds mild decreased without overt wheezing, rales  Abd:  Soft, NT, non-distended, + BS Neurological: Pt is alert. Not confused  Skin: Skin is warm. No erythema. No LE edema Psychiatric: Pt behavior is normal. Thought content normal.     Assessment & Plan:

## 2012-12-27 NOTE — Patient Instructions (Addendum)
Please remember to followup with your GYN for the yearly pap smear and/or mammogram You had the flu shot today Please take all new medication as prescribed - the omeprazole 20 mg per day You are given the advair sample to use at 1 puff twice per day (and the [facility] inhaler also was refilled to your pharmacy) OK to reduce the iron to once per day Your EKG was OK today You will be contacted regarding the referral for: stress test Please go to the XRAY Department in the Basement (go straight as you get off the elevator) for the x-ray testing  You will be contacted by phone if any changes need to be made immediately.  Otherwise, you will receive a letter about your results with an explanation, but please check with MyChart first.  Please remember to sign up for My Chart if you have not done so, as this will be important to you in the future with finding out test results, communicating by private email, and scheduling acute appointments online when needed.

## 2012-12-27 NOTE — Assessment & Plan Note (Addendum)
?   Clinical signficance - ECG reviewed as per emr, atypical , for stress test given symptoms with exertions and risk factors

## 2012-12-27 NOTE — Assessment & Plan Note (Addendum)
?   Bronchospasm related - gave advair 250 sample for use, and refill of HFA as on chart, consider add albuterol, for cxr as well - r/o other , doubt recurrent PE but to consider CT

## 2012-12-27 NOTE — Assessment & Plan Note (Signed)
stable overall by history and exam, recent data reviewed with pt, and pt to continue medical treatment as before,  to f/u any worsening symptoms or concerns BP Readings from Last 3 Encounters:  12/27/12 120/80  08/12/12 130/72  05/28/12 140/88

## 2013-01-08 ENCOUNTER — Encounter (HOSPITAL_COMMUNITY): Payer: Medicare Other

## 2013-01-16 ENCOUNTER — Ambulatory Visit (HOSPITAL_COMMUNITY): Payer: Medicare Other | Attending: Cardiology | Admitting: Radiology

## 2013-01-16 VITALS — BP 163/64 | HR 64 | Ht 65.0 in | Wt 215.0 lb

## 2013-01-16 DIAGNOSIS — E785 Hyperlipidemia, unspecified: Secondary | ICD-10-CM | POA: Diagnosis not present

## 2013-01-16 DIAGNOSIS — R0609 Other forms of dyspnea: Secondary | ICD-10-CM | POA: Insufficient documentation

## 2013-01-16 DIAGNOSIS — E669 Obesity, unspecified: Secondary | ICD-10-CM | POA: Insufficient documentation

## 2013-01-16 DIAGNOSIS — R0789 Other chest pain: Secondary | ICD-10-CM | POA: Insufficient documentation

## 2013-01-16 DIAGNOSIS — Z8249 Family history of ischemic heart disease and other diseases of the circulatory system: Secondary | ICD-10-CM | POA: Diagnosis not present

## 2013-01-16 DIAGNOSIS — R0602 Shortness of breath: Secondary | ICD-10-CM

## 2013-01-16 DIAGNOSIS — Z87891 Personal history of nicotine dependence: Secondary | ICD-10-CM | POA: Insufficient documentation

## 2013-01-16 DIAGNOSIS — R61 Generalized hyperhidrosis: Secondary | ICD-10-CM | POA: Insufficient documentation

## 2013-01-16 DIAGNOSIS — R079 Chest pain, unspecified: Secondary | ICD-10-CM

## 2013-01-16 DIAGNOSIS — R0989 Other specified symptoms and signs involving the circulatory and respiratory systems: Secondary | ICD-10-CM | POA: Insufficient documentation

## 2013-01-16 DIAGNOSIS — I10 Essential (primary) hypertension: Secondary | ICD-10-CM | POA: Insufficient documentation

## 2013-01-16 DIAGNOSIS — Z8673 Personal history of transient ischemic attack (TIA), and cerebral infarction without residual deficits: Secondary | ICD-10-CM | POA: Diagnosis not present

## 2013-01-16 DIAGNOSIS — R5381 Other malaise: Secondary | ICD-10-CM | POA: Insufficient documentation

## 2013-01-16 MED ORDER — TECHNETIUM TC 99M SESTAMIBI GENERIC - CARDIOLITE
10.0000 | Freq: Once | INTRAVENOUS | Status: AC | PRN
Start: 2013-01-16 — End: 2013-01-16
  Administered 2013-01-16: 10 via INTRAVENOUS

## 2013-01-16 MED ORDER — TECHNETIUM TC 99M SESTAMIBI GENERIC - CARDIOLITE
30.0000 | Freq: Once | INTRAVENOUS | Status: AC | PRN
Start: 2013-01-16 — End: 2013-01-16
  Administered 2013-01-16: 30 via INTRAVENOUS

## 2013-01-16 MED ORDER — REGADENOSON 0.4 MG/5ML IV SOLN
0.4000 mg | Freq: Once | INTRAVENOUS | Status: AC
  Administered 2013-01-16: 0.4 mg via INTRAVENOUS

## 2013-01-16 NOTE — Progress Notes (Signed)
[facility] SITE 3 NUCLEAR MED [address] [place], [place] [address] [phone]    Cardiology Nuclear Med Study  MARIELLA Walker is a 67 y.o. female     MRN : [id number]     DOB: 02-22-1946  Procedure Date: 01/16/2013  Nuclear Med Background Indication for Stress Test:  Evaluation for Ischemia History:  '12 Echo:EF=65% Cardiac Risk Factors: Family History - CAD, History of Smoking, Hypertension, Lipids, Obesity and TIA  Symptoms:  Chest Pain with and without Exertion (last episode of chest discomfort was about 67-months ago), Diaphoresis, DOE and Fatigue   Nuclear Pre-Procedure Caffeine/Decaff Intake:  None NPO After: 7:00am   Lungs:  Clear. O2 Sat: 96% on room air. IV 0.9% NS with Angio Cath:  22g  IV Site: R Forearm  IV Started by:  Cathlyn Parsons, RN  Chest Size (in):  42 Cup Size: C  Height: 5\' 5"  (1.651 m)  Weight:  215 lb (97.523 kg)  BMI:  Body mass index is 35.78 kg/(m^2). Tech Comments:  n/a    Nuclear Med Study 1 or 2 day study: 1 day  Stress Test Type:  Lexiscan  Reading MD: Wyvonna Plum  Order Authorizing Provider:  Bayard Males  Resting Radionuclide: Technetium 108m Sestamibi  Resting Radionuclide Dose: 11.0 mCi   Stress Radionuclide:  Technetium 23m Sestamibi  Stress Radionuclide Dose: 33.0 mCi           Stress Protocol Rest HR: 64 Stress HR: 101  Rest BP: 163/94 Stress BP: 178/107  Exercise Time (min): n/a METS: n/a   Predicted Max HR: 155 bpm % Max HR: 65.16 bpm Rate Pressure Product: 21308   Dose of Adenosine (mg):  n/a Dose of Lexiscan: 0.4 mg  Dose of Atropine (mg): n/a Dose of Dobutamine: n/a mcg/kg/min (at max HR)  Stress Test Technologist: Smiley Houseman, CMA-N  Nuclear Technologist:  Domenic Polite, CNMT     Rest Procedure:  Myocardial perfusion imaging was performed at rest 45 minutes following the intravenous administration of Technetium 25m Sestamibi.  Rest ECG: NSR - Normal EKG  Stress Procedure:  The patient  received IV Lexiscan 0.4 mg over 15-seconds.  Technetium 36m Sestamibi injected at 30-seconds.  Quantitative spect images were obtained after a 45 minute delay.  Stress ECG: No significant change from baseline ECG  QPS Raw Data Images:  Normal; no motion artifact; normal heart/lung ratio. Stress Images:  Normal homogeneous uptake in all areas of the myocardium. Rest Images:  Normal homogeneous uptake in all areas of the myocardium. Subtraction (SDS):  No evidence of ischemia. Transient Ischemic Dilatation (Normal <1.22):  n/a Lung/Heart Ratio (Normal <0.45):  0.44  Quantitative Gated Spect Images QGS EDV:  90 ml QGS ESV:  26 ml  Impression Exercise Capacity:  Lexiscan with no exercise. BP Response:  Normal blood pressure response. Clinical Symptoms:  No symptoms. ECG Impression:  No significant ST segment change suggestive of ischemia. Comparison with Prior Nuclear Study: No previous nuclear study performed  Overall Impression:  Normal stress nuclear study.  LV Ejection Fraction: 71%.  LV Wall Motion:  NL LV Function; NL Wall Motion  Brianna Walker, Brianna Walker 01/16/2013

## 2013-01-22 ENCOUNTER — Ambulatory Visit (INDEPENDENT_AMBULATORY_CARE_PROVIDER_SITE_OTHER): Payer: Medicare Other

## 2013-01-22 ENCOUNTER — Ambulatory Visit (INDEPENDENT_AMBULATORY_CARE_PROVIDER_SITE_OTHER): Payer: Medicare Other | Admitting: Internal Medicine

## 2013-01-22 ENCOUNTER — Encounter: Payer: Self-pay | Admitting: Internal Medicine

## 2013-01-22 VITALS — BP 110/70 | HR 81 | Temp 98.6°F | Ht 65.5 in | Wt 215.2 lb

## 2013-01-22 DIAGNOSIS — Z23 Encounter for immunization: Secondary | ICD-10-CM | POA: Diagnosis not present

## 2013-01-22 DIAGNOSIS — I10 Essential (primary) hypertension: Secondary | ICD-10-CM

## 2013-01-22 DIAGNOSIS — R232 Flushing: Secondary | ICD-10-CM

## 2013-01-22 DIAGNOSIS — Z800 Family history of malignant neoplasm of digestive organs: Secondary | ICD-10-CM | POA: Diagnosis not present

## 2013-01-22 DIAGNOSIS — K219 Gastro-esophageal reflux disease without esophagitis: Secondary | ICD-10-CM

## 2013-01-22 DIAGNOSIS — E785 Hyperlipidemia, unspecified: Secondary | ICD-10-CM

## 2013-01-22 DIAGNOSIS — N951 Menopausal and female climacteric states: Secondary | ICD-10-CM

## 2013-01-22 LAB — HEPATIC FUNCTION PANEL
AST: 19 U/L (ref 0–37)
Albumin: 3.7 g/dL (ref 3.5–5.2)
Bilirubin, Direct: 0.1 mg/dL (ref 0.0–0.3)
Total Bilirubin: 0.8 mg/dL (ref 0.3–1.2)

## 2013-01-22 LAB — BASIC METABOLIC PANEL
BUN: 14 mg/dL (ref 6–23)
Calcium: 9.3 mg/dL (ref 8.4–10.5)
Creatinine, Ser: 0.9 mg/dL (ref 0.4–1.2)
GFR: 82.89 mL/min (ref >60.00)
Glucose, Bld: 92 mg/dL (ref 70–99)
Potassium: 3.8 mEq/L (ref 3.5–5.1)

## 2013-01-22 LAB — LIPID PANEL
Cholesterol: 175 mg/dL (ref 0–200)
Triglycerides: 231 mg/dL — ABNORMAL HIGH (ref 0.0–149.0)

## 2013-01-22 LAB — URINALYSIS, ROUTINE W REFLEX MICROSCOPIC
Nitrite: NEGATIVE
Specific Gravity, Urine: 1.025 (ref 1.000–1.030)
pH: 5.5 (ref 5.0–8.0)

## 2013-01-22 LAB — CBC WITH DIFFERENTIAL/PLATELET
Basophils Absolute: 0 10*3/uL (ref 0.0–0.1)
Eosinophils Absolute: 0.2 10*3/uL (ref 0.0–0.7)
Eosinophils Relative: 2.5 % (ref 0.0–5.0)
HCT: 37.6 % (ref 36.0–46.0)
Lymphs Abs: 2 10*3/uL (ref 0.7–4.0)
MCV: 80.6 fl (ref 78.0–100.0)
Monocytes Relative: 4.9 % (ref 3.0–12.0)
Neutrophils Relative %: 67.3 % (ref 43.0–77.0)
Platelets: 354 10*3/uL (ref 150.0–400.0)
WBC: 8.1 10*3/uL (ref 4.5–10.5)

## 2013-01-22 LAB — TSH: TSH: 0.26 u[IU]/mL — ABNORMAL LOW (ref 0.35–5.50)

## 2013-01-22 MED ORDER — ESTRADIOL 0.025 MG/24HR TD PTTW: 1.0000 | MEDICATED_PATCH | TRANSDERMAL | Status: DC

## 2013-01-22 MED ORDER — OMEPRAZOLE 20 MG PO CPDR: 20.0000 mg | DELAYED_RELEASE_CAPSULE | Freq: Every day | ORAL | Status: DC

## 2013-01-22 MED ORDER — BENAZEPRIL HCL 40 MG PO TABS: 40.0000 mg | ORAL_TABLET | Freq: Every day | ORAL | Status: DC

## 2013-01-22 MED ORDER — PHENTERMINE HCL 37.5 MG PO CAPS: 37.5000 mg | ORAL_CAPSULE | ORAL | Status: DC

## 2013-01-22 NOTE — Progress Notes (Signed)
Subjective:    Patient ID: Brianna Walker, female    DOB: December 21, 1945, 67 y.o.   MRN: [id number]  HPI  Here for yearly f/u;  Overall doing ok;  Pt denies CP, worsening SOB, DOE, wheezing, orthopnea, PND, worsening LE edema, palpitations, dizziness or syncope.  Pt denies neurological change such as new headache, facial or extremity weakness.  Pt denies polydipsia, polyuria, or low sugar symptoms. Pt states overall good compliance with treatment and medications, good tolerability, and has been trying to follow lower cholesterol diet.  Pt denies worsening depressive symptoms, suicidal ideation or panic. No fever, night sweats, wt loss, loss of appetite, or other constitutional symptoms.  Pt states good ability with ADL's, has low fall risk, home safety reviewed and adequate, no other significant changes in hearing or vision, and only occasionally active with exercise, trying to walk most days for months, gets some overheated.  Does also have occas hot flash, ongoing with sweating for many yrs.   Denies worsening reflux, abd pain, dysphagia, n/v, bowel change or blood.  Having several hot flash per day.  Also with some occas forgetfulness but always remembers later Past Medical History  Diagnosis Date  . ALLERGIC RHINITIS 09/12/2007  . BACK PAIN 06/19/2008  . Cramp of limb 08/11/2009  . DEEP VENOUS THROMBOPHLEBITIS, LEG, RIGHT 03/04/2010  . Long term (current) use of anticoagulants 11/29/2010  . FREQUENCY, URINARY 10/07/2009  . GERD 12/05/2006  . HEMORRHOIDS 11/17/2006  . HIP PAIN, RIGHT, CHRONIC 08/11/2009  . HX, PERSONAL, TUBERCULOSIS 11/17/2006  . HYPERLIPIDEMIA 09/12/2007  . HYPERTENSION 11/17/2006  . INSOMNIA-SLEEP DISORDER-UNSPEC 06/19/2008  . LEG PAIN, RIGHT 06/19/2008  . MASS, SUPERFICIAL 09/12/2007  . MENOPAUSAL DISORDER 09/20/2009  . OVERACTIVE BLADDER 03/04/2010  . PULMONARY EMBOLISM, HX OF 11/17/2006  . RECTAL BLEEDING 10/07/2007  . SCIATICA, RIGHT 09/12/2007  . SKIN LESION 07/20/2009  . TMJ SYNDROME  11/17/2006  . Iron deficiency anemia 12/07/2011  . Diverticulosis    Past Surgical History  Procedure Laterality Date  . Cholecystectomy    . Abdominal hysterectomy    . Inguinal herniorrhapy      multiple ventral hernia repair/mesh  . Tumor removed off of right shoulder    . S/p right hip replaced min invasive hip surgury [facility] oct 2011      reports that she has never smoked. She does not have any smokeless tobacco history on file. She reports that she does not drink alcohol or use illicit drugs. family history includes Colon cancer in her father. No Known Allergies Current Outpatient Prescriptions on File Prior to Visit  Medication Sig Dispense Refill  . aspirin 81 MG tablet Take 81 mg by mouth daily.        . ferrous sulfate 325 (65 FE) MG tablet Take 1 tablet (325 mg total) by mouth 2 (two) times daily.  180 tablet  3  . fluticasone-salmeterol (ADVAIR HFA) 115-21 MCG/ACT inhaler Inhale 2 puffs into the lungs 2 (two) times daily.  1 Inhaler  12  . pravastatin (PRAVACHOL) 20 MG tablet TAKE ONE TABLET BY MOUTH IN THE EVENING  90 tablet  2  . warfarin (COUMADIN) 5 MG tablet Please fill 8 tablets.  Patient must have pt/inr checked before further re-fills.  8 tablet  0   No current facility-administered medications on file prior to visit.   Review of Systems Constitutional: Negative for diaphoresis, activity change, appetite change or unexpected weight change.  HENT: Negative for hearing loss, ear pain, facial swelling, mouth sores  and neck stiffness.   Eyes: Negative for pain, redness and visual disturbance.  Respiratory: Negative for shortness of breath and wheezing.   Cardiovascular: Negative for chest pain and palpitations.  Gastrointestinal: Negative for diarrhea, blood in stool, abdominal distention or other pain Genitourinary: Negative for hematuria, flank pain or change in urine volume.  Musculoskeletal: Negative for myalgias and joint swelling.  Skin: Negative for color  change and wound.  Neurological: Negative for syncope and numbness. other than noted Hematological: Negative for adenopathy.  Psychiatric/Behavioral: Negative for hallucinations, self-injury, decreased concentration and agitation.      Objective:   Physical Exam BP 110/70  Pulse 81  Temp(Src) 98.6 F (37 C) (Oral)  Ht 5' 5.5" (1.664 m)  Wt 215 lb 4 oz (97.637 kg)  BMI 35.26 kg/m2  SpO2 94% VS noted, not ill appearing Constitutional: Pt is oriented to person, place, and time. Appears well-developed and well-nourished.  Head: Normocephalic and atraumatic.  Right Ear: External ear normal.  Left Ear: External ear normal.  Nose: Nose normal.  Mouth/Throat: Oropharynx is clear and moist.  Eyes: Conjunctivae and EOM are normal. Pupils are equal, round, and reactive to light.  Neck: Normal range of motion. Neck supple. No JVD present. No tracheal deviation present.  Cardiovascular: Normal rate, regular rhythm, normal heart sounds and intact distal pulses.   Pulmonary/Chest: Effort normal and breath sounds normal.  Abdominal: Soft. Bowel sounds are normal. There is no tenderness. No HSM  Musculoskeletal: Normal range of motion. Exhibits no edema.  Lymphadenopathy:  Has no cervical adenopathy.  Neurological: Pt is alert and oriented to person, place, and time. Pt has normal reflexes. No cranial nerve deficit.  Skin: Skin is warm and dry. No rash noted.  Psychiatric:  Has  normal mood and affect. Behavior is normal.     Assessment & Plan:

## 2013-01-22 NOTE — Patient Instructions (Addendum)
Please take all new medication as prescribed - the patch You had the "regular" pneumonia shot today Please continue all other medications as before, and refills have been done if requested per robin Please have the pharmacy call with any other refills you may need. You will be contacted regarding the referral for: GI  - Dr Russella Dar Please continue your efforts at being more active, low cholesterol diet, and weight control. You are otherwise up to date with prevention measures today.  Please go to the LAB in the Basement (turn left off the elevator) for the tests to be done today  Please return in 6 months, or sooner if needed

## 2013-01-26 DIAGNOSIS — Z800 Family history of malignant neoplasm of digestive organs: Secondary | ICD-10-CM | POA: Insufficient documentation

## 2013-01-26 DIAGNOSIS — R232 Flushing: Secondary | ICD-10-CM | POA: Insufficient documentation

## 2013-01-26 NOTE — Assessment & Plan Note (Signed)
For vivelle patch asd,  to f/u any worsening symptoms or concerns j

## 2013-01-26 NOTE — Assessment & Plan Note (Signed)
Due for fu colonsocpy

## 2013-01-26 NOTE — Assessment & Plan Note (Signed)
stable overall by history and exam, recent data reviewed with pt, and pt to continue medical treatment as before,  to f/u any worsening symptoms or concerns BP Readings from Last 3 Encounters:  01/22/13 110/70  01/16/13 163/64  12/27/12 120/80

## 2013-01-26 NOTE — Assessment & Plan Note (Signed)
stable overall by history and exam, recent data reviewed with pt, and pt to continue medical treatment as before,  to f/u any worsening symptoms or concerns Lab Results  Component Value Date   WBC 8.1 01/22/2013   HGB 12.5 01/22/2013   HCT 37.6 01/22/2013   PLT 354.0 01/22/2013   GLUCOSE 92 01/22/2013   CHOL 175 01/22/2013   TRIG 231.0* 01/22/2013   HDL 37.10* 01/22/2013   LDLDIRECT 93.5 01/22/2013   LDLCALC 81 05/28/2012   ALT 16 01/22/2013   AST 19 01/22/2013   NA 140 01/22/2013   K 3.8 01/22/2013   CL 106 01/22/2013   CREATININE 0.9 01/22/2013   BUN 14 01/22/2013   CO2 29 01/22/2013   TSH 0.26* 01/22/2013   INR 1.7 12/27/2012

## 2013-01-26 NOTE — Assessment & Plan Note (Signed)
stable overall by history and exam, recent data reviewed with pt, and pt to continue medical treatment as before,  to f/u any worsening symptoms or concerns Lab Results  Component Value Date   LDLCALC 81 05/28/2012

## 2013-02-07 ENCOUNTER — Encounter: Payer: Self-pay | Admitting: Gastroenterology

## 2013-02-07 ENCOUNTER — Ambulatory Visit (INDEPENDENT_AMBULATORY_CARE_PROVIDER_SITE_OTHER): Payer: Medicare Other | Admitting: General Practice

## 2013-02-07 DIAGNOSIS — G459 Transient cerebral ischemic attack, unspecified: Secondary | ICD-10-CM

## 2013-02-07 DIAGNOSIS — I82409 Acute embolism and thrombosis of unspecified deep veins of unspecified lower extremity: Secondary | ICD-10-CM

## 2013-02-07 DIAGNOSIS — I2699 Other pulmonary embolism without acute cor pulmonale: Secondary | ICD-10-CM | POA: Diagnosis not present

## 2013-02-07 DIAGNOSIS — Z7901 Long term (current) use of anticoagulants: Secondary | ICD-10-CM | POA: Diagnosis not present

## 2013-02-10 ENCOUNTER — Ambulatory Visit: Payer: BC Managed Care – PPO | Admitting: Internal Medicine

## 2013-02-18 ENCOUNTER — Ambulatory Visit: Payer: Medicare Other | Admitting: Gastroenterology

## 2013-03-07 ENCOUNTER — Ambulatory Visit (INDEPENDENT_AMBULATORY_CARE_PROVIDER_SITE_OTHER): Payer: Medicare Other | Admitting: General Practice

## 2013-03-07 DIAGNOSIS — I82409 Acute embolism and thrombosis of unspecified deep veins of unspecified lower extremity: Secondary | ICD-10-CM | POA: Diagnosis not present

## 2013-03-07 DIAGNOSIS — G459 Transient cerebral ischemic attack, unspecified: Secondary | ICD-10-CM | POA: Diagnosis not present

## 2013-03-07 DIAGNOSIS — Z7901 Long term (current) use of anticoagulants: Secondary | ICD-10-CM | POA: Diagnosis not present

## 2013-03-07 DIAGNOSIS — I2699 Other pulmonary embolism without acute cor pulmonale: Secondary | ICD-10-CM

## 2013-03-07 NOTE — Progress Notes (Signed)
Pre-visit discussion using our clinic review tool. No additional management support is needed unless otherwise documented below in the visit note.  

## 2013-03-17 ENCOUNTER — Other Ambulatory Visit: Payer: Self-pay | Admitting: Internal Medicine

## 2013-03-18 ENCOUNTER — Other Ambulatory Visit: Payer: Self-pay | Admitting: General Practice

## 2013-03-18 ENCOUNTER — Other Ambulatory Visit: Payer: Self-pay | Admitting: *Deleted

## 2013-03-18 MED ORDER — WARFARIN SODIUM 5 MG PO TABS: ORAL_TABLET | ORAL | Status: DC

## 2013-03-18 NOTE — Telephone Encounter (Signed)
Received PA from insurance form has been completed through cover-my-meds. Waiting on approval status...Brianna Walker

## 2013-03-19 NOTE — Telephone Encounter (Signed)
Received PA back med has been approved notified [facility] spoke with ed gave approval status...Brianna Walker

## 2013-05-16 ENCOUNTER — Other Ambulatory Visit: Payer: Self-pay | Admitting: Internal Medicine

## 2013-07-04 ENCOUNTER — Other Ambulatory Visit: Payer: Self-pay | Admitting: *Deleted

## 2013-07-04 NOTE — Telephone Encounter (Signed)
Ok to refill? (I'm not sure how I got this)

## 2013-07-08 ENCOUNTER — Other Ambulatory Visit: Payer: Self-pay | Admitting: General Practice

## 2013-07-08 ENCOUNTER — Telehealth: Payer: Self-pay | Admitting: *Deleted

## 2013-07-08 MED ORDER — WARFARIN SODIUM 5 MG PO TABS: ORAL_TABLET | ORAL | Status: DC

## 2013-07-08 NOTE — Telephone Encounter (Signed)
Patient phoned requesting coumadin refill. Last anti-coag visit 03/14/13 and last OV with PCP 12/27/12.  CB# [phone]

## 2013-07-18 ENCOUNTER — Ambulatory Visit (INDEPENDENT_AMBULATORY_CARE_PROVIDER_SITE_OTHER): Payer: Medicare Other | Admitting: Internal Medicine

## 2013-07-18 ENCOUNTER — Ambulatory Visit (INDEPENDENT_AMBULATORY_CARE_PROVIDER_SITE_OTHER): Payer: Medicare Other | Admitting: General Practice

## 2013-07-18 ENCOUNTER — Encounter: Payer: Self-pay | Admitting: Internal Medicine

## 2013-07-18 VITALS — BP 128/80 | HR 90 | Temp 98.4°F | Ht 65.5 in | Wt 218.8 lb

## 2013-07-18 DIAGNOSIS — K146 Glossodynia: Secondary | ICD-10-CM | POA: Insufficient documentation

## 2013-07-18 DIAGNOSIS — I82409 Acute embolism and thrombosis of unspecified deep veins of unspecified lower extremity: Secondary | ICD-10-CM | POA: Diagnosis not present

## 2013-07-18 DIAGNOSIS — M25559 Pain in unspecified hip: Secondary | ICD-10-CM | POA: Diagnosis not present

## 2013-07-18 DIAGNOSIS — Z5181 Encounter for therapeutic drug level monitoring: Secondary | ICD-10-CM | POA: Diagnosis not present

## 2013-07-18 DIAGNOSIS — I10 Essential (primary) hypertension: Secondary | ICD-10-CM

## 2013-07-18 LAB — POCT INR: INR: 2.6

## 2013-07-18 MED ORDER — TRIAMCINOLONE ACETONIDE 0.1 % MT PSTE: 1.0000 "application " | PASTE | Freq: Two times a day (BID) | OROMUCOSAL | Status: DC

## 2013-07-18 MED ORDER — TRAMADOL HCL 50 MG PO TABS: 50.0000 mg | ORAL_TABLET | Freq: Every evening | ORAL | Status: DC | PRN

## 2013-07-18 MED ORDER — APIXABAN 2.5 MG PO TABS: 2.5000 mg | ORAL_TABLET | Freq: Two times a day (BID) | ORAL | Status: DC

## 2013-07-18 NOTE — Progress Notes (Signed)
Subjective:    Patient ID: Brianna Walker, female    DOB: 1945-06-02, 68 y.o.   MRN: [id number]  HPI Here to f/u, asks for change from coumadin to eliquis to avoid monthly blood draws, understands the likely increased cost.  Has ongoing right hip pain, worse recently at night to lie on right side, moderate at night, less during the day. Also with recurring irriation and pain and occas bleeding to tip of tongue, denies biting tongue, just seems to keep recurring.  Pt denies chest pain, increased sob or doe, wheezing, orthopnea, PND, increased LE swelling, palpitations, dizziness or syncope. Pt denies new neurological symptoms such as new headache, or facial or extremity weakness or numbness   Pt denies polydipsia, polyuria, Past Medical History  Diagnosis Date  . ALLERGIC RHINITIS 09/12/2007  . BACK PAIN 06/19/2008  . Cramp of limb 08/11/2009  . DEEP VENOUS THROMBOPHLEBITIS, LEG, RIGHT 03/04/2010  . Long term (current) use of anticoagulants 11/29/2010  . FREQUENCY, URINARY 10/07/2009  . GERD 12/05/2006  . HEMORRHOIDS 11/17/2006  . HIP PAIN, RIGHT, CHRONIC 08/11/2009  . HX, PERSONAL, TUBERCULOSIS 11/17/2006  . HYPERLIPIDEMIA 09/12/2007  . HYPERTENSION 11/17/2006  . INSOMNIA-SLEEP DISORDER-UNSPEC 06/19/2008  . LEG PAIN, RIGHT 06/19/2008  . MASS, SUPERFICIAL 09/12/2007  . MENOPAUSAL DISORDER 09/20/2009  . OVERACTIVE BLADDER 03/04/2010  . PULMONARY EMBOLISM, HX OF 11/17/2006  . RECTAL BLEEDING 10/07/2007  . SCIATICA, RIGHT 09/12/2007  . SKIN LESION 07/20/2009  . TMJ SYNDROME 11/17/2006  . Iron deficiency anemia 12/07/2011  . Diverticulosis    Past Surgical History  Procedure Laterality Date  . Cholecystectomy    . Abdominal hysterectomy    . Inguinal herniorrhapy      multiple ventral hernia repair/mesh  . Tumor removed off of right shoulder    . S/p right hip replaced min invasive hip surgury [facility] oct 2011      reports that she has never smoked. She does not have any smokeless tobacco history on  file. She reports that she does not drink alcohol or use illicit drugs. family history includes Colon cancer in her father. No Known Allergies Current Outpatient Prescriptions on File Prior to Visit  Medication Sig Dispense Refill  . aspirin 81 MG tablet Take 81 mg by mouth daily.        . benazepril (LOTENSIN) 40 MG tablet Take 1 tablet (40 mg total) by mouth daily.  90 tablet  3  . estradiol (VIVELLE-DOT) 0.025 MG/24HR Place 1 patch onto the skin 2 (two) times a week.  8 patch  12  . ferrous sulfate 325 (65 FE) MG tablet Take 1 tablet (325 mg total) by mouth 2 (two) times daily.  180 tablet  3  . fluticasone-salmeterol (ADVAIR HFA) 115-21 MCG/ACT inhaler Inhale 2 puffs into the lungs 2 (two) times daily.  1 Inhaler  12  . omeprazole (PRILOSEC) 20 MG capsule Take 1 capsule (20 mg total) by mouth daily.  90 capsule  3  . phentermine 37.5 MG capsule Take 1 capsule (37.5 mg total) by mouth every morning.  30 capsule  2  . pravastatin (PRAVACHOL) 20 MG tablet TAKE ONE TABLET BY MOUTH IN THE EVENING  90 tablet  3  . warfarin (COUMADIN) 5 MG tablet Take as directed by anticoagulation clinic  10 tablet  0   No current facility-administered medications on file prior to visit.   Review of Systems  Constitutional: Negative for unexpected weight change, or unusual diaphoresis  HENT: Negative for tinnitus.  Eyes: Negative for photophobia and visual disturbance.  Respiratory: Negative for choking and stridor.   Gastrointestinal: Negative for vomiting and blood in stool.  Genitourinary: Negative for hematuria and decreased urine volume.  Musculoskeletal: Negative for acute joint swelling Skin: Negative for color change and wound.  Neurological: Negative for tremors and numbness other than noted  Psychiatric/Behavioral: Negative for decreased concentration or  hyperactivity.       Objective:   Physical Exam BP 128/80  Pulse 90  Temp(Src) 98.4 F (36.9 C) (Oral)  Ht 5' 5.5" (1.664 m)  Wt 218  lb 12 oz (99.224 kg)  BMI 35.84 kg/m2  SpO2 94% VS noted,  Constitutional: Pt appears well-developed and well-nourished.  HENT: Head: NCAT.  Right Ear: External ear normal.  Left Ear: External ear normal.  Tongue normal appearacne without ulcer, swelling, bleeding Eyes: Conjunctivae and EOM are normal. Pupils are equal, round, and reactive to light.  Neck: Normal range of motion. Neck supple.  Cardiovascular: Normal rate and regular rhythm.   Pulmonary/Chest: Effort normal and breath sounds normal.  Abd:  Soft, NT, non-distended, + BS Neurological: Pt is alert. Not confused  Skin: Skin is warm. No erythema.  Psychiatric: Pt behavior is normal. Thought content normal.     Assessment & Plan:

## 2013-07-18 NOTE — Patient Instructions (Signed)
Please take all new medication as prescribed- the Eliquis at 2.5 mg twice per day  Please do not stop the coumadin until you have the Eliquis in hand to start that instead  Please take all new medication as prescribed - the tramadol for pain at night, as well as the paste kind of medication for the tongue  Please continue all other medications as before, and refills have been done if requested. Please have the pharmacy call with any other refills you may need.  Please keep your appointments with your specialists as you have planned  Please return in 6 months, or sooner if needed

## 2013-07-18 NOTE — Assessment & Plan Note (Signed)
With hx of PE, on coumadin, ok to transition to eliquis per pt request if affordable

## 2013-07-18 NOTE — Progress Notes (Signed)
Pre visit review using our clinic review tool, if applicable. No additional management support is needed unless otherwise documented below in the visit note. 

## 2013-07-18 NOTE — Assessment & Plan Note (Signed)
Ok for tramadol 50 qhs prn

## 2013-07-19 NOTE — Assessment & Plan Note (Signed)
Ok for kenalog in oragel prn,  to f/u any worsening symptoms or concerns

## 2013-07-19 NOTE — Assessment & Plan Note (Signed)
stable overall by history and exam, recent data reviewed with pt, and pt to continue medical treatment as before,  to f/u any worsening symptoms or concerns BP Readings from Last 3 Encounters:  07/18/13 128/80  01/22/13 110/70  01/16/13 163/64

## 2013-07-21 ENCOUNTER — Telehealth: Payer: Self-pay | Admitting: Internal Medicine

## 2013-07-21 NOTE — Telephone Encounter (Signed)
Relevant patient education assigned to patient using Emmi. ° °

## 2013-08-04 ENCOUNTER — Telehealth: Payer: Self-pay | Admitting: Internal Medicine

## 2013-08-04 MED ORDER — APIXABAN 5 MG PO TABS
5.0000 mg | ORAL_TABLET | Freq: Two times a day (BID) | ORAL | Status: DC
Start: 2013-08-04 — End: 2014-02-11

## 2013-08-04 NOTE — Telephone Encounter (Signed)
Ok for 5 bid eliquis which is better dose for this pt now that she has been on the initial dosing  Robin to let pt know of change please

## 2013-08-04 NOTE — Telephone Encounter (Signed)
Message copied by Biagio Borg on Mon Aug 04, 2013  2:15 PM ------      Message from: Meriam Sprague D      Created: Mon Aug 04, 2013  1:45 PM       Hey Dr. Jenny Reichmann,            Patient called me today about changing from coumadin to Eliquis.      You actually saw her in March and sent in a script for 2.5 mg tablets.      I don't mean to question the dosage but the indications state to give 2.5 mg bid only if 2 of the following exist : > 66 years old, < 60 kg, or Scr >1.5.  None of this applies to this patient.  Shouldn't she take 5 mg bid?  Please let me know your thoughts on this.             Thanks!            Villa Herb       ------

## 2013-11-14 ENCOUNTER — Ambulatory Visit (INDEPENDENT_AMBULATORY_CARE_PROVIDER_SITE_OTHER): Payer: Medicare Other | Admitting: General Practice

## 2013-11-14 DIAGNOSIS — I82409 Acute embolism and thrombosis of unspecified deep veins of unspecified lower extremity: Secondary | ICD-10-CM

## 2013-11-14 DIAGNOSIS — Z5181 Encounter for therapeutic drug level monitoring: Secondary | ICD-10-CM

## 2013-11-14 NOTE — Progress Notes (Signed)
Pre visit review using our clinic review tool, if applicable. No additional management support is needed unless otherwise documented below in the visit note. 

## 2014-01-13 ENCOUNTER — Encounter: Payer: Self-pay | Admitting: Internal Medicine

## 2014-01-20 ENCOUNTER — Ambulatory Visit: Payer: Medicare Other | Admitting: Internal Medicine

## 2014-01-27 ENCOUNTER — Ambulatory Visit: Payer: Medicare Other | Admitting: Internal Medicine

## 2014-02-04 ENCOUNTER — Ambulatory Visit: Payer: Medicare Other | Admitting: Internal Medicine

## 2014-02-11 ENCOUNTER — Other Ambulatory Visit (INDEPENDENT_AMBULATORY_CARE_PROVIDER_SITE_OTHER): Payer: Medicare Other

## 2014-02-11 ENCOUNTER — Ambulatory Visit (INDEPENDENT_AMBULATORY_CARE_PROVIDER_SITE_OTHER): Payer: Medicare Other | Admitting: Internal Medicine

## 2014-02-11 ENCOUNTER — Encounter: Payer: Self-pay | Admitting: Internal Medicine

## 2014-02-11 VITALS — BP 120/78 | HR 106 | Temp 98.5°F | Ht 65.5 in | Wt 214.5 lb

## 2014-02-11 DIAGNOSIS — G894 Chronic pain syndrome: Secondary | ICD-10-CM | POA: Diagnosis not present

## 2014-02-11 DIAGNOSIS — K921 Melena: Secondary | ICD-10-CM

## 2014-02-11 DIAGNOSIS — E785 Hyperlipidemia, unspecified: Secondary | ICD-10-CM

## 2014-02-11 DIAGNOSIS — Z23 Encounter for immunization: Secondary | ICD-10-CM

## 2014-02-11 DIAGNOSIS — E8881 Metabolic syndrome: Secondary | ICD-10-CM | POA: Diagnosis not present

## 2014-02-11 LAB — CBC WITH DIFFERENTIAL/PLATELET
BASOS ABS: 0.1 10*3/uL (ref 0.0–0.1)
BASOS PCT: 0.7 % (ref 0.0–3.0)
Eosinophils Absolute: 0.2 10*3/uL (ref 0.0–0.7)
Eosinophils Relative: 1.7 % (ref 0.0–5.0)
HCT: 40.7 % (ref 36.0–46.0)
Hemoglobin: 13.3 g/dL (ref 12.0–15.0)
LYMPHS PCT: 20.4 % (ref 12.0–46.0)
Lymphs Abs: 2.1 10*3/uL (ref 0.7–4.0)
MCHC: 32.6 g/dL (ref 30.0–36.0)
MCV: 79.4 fl (ref 78.0–100.0)
Monocytes Absolute: 0.4 10*3/uL (ref 0.1–1.0)
Monocytes Relative: 3.8 % (ref 3.0–12.0)
NEUTROS PCT: 73.4 % (ref 43.0–77.0)
Neutro Abs: 7.6 10*3/uL (ref 1.4–7.7)
Platelets: 363 10*3/uL (ref 150.0–400.0)
RBC: 5.13 Mil/uL — AB (ref 3.87–5.11)
RDW: 16.4 % — ABNORMAL HIGH (ref 11.5–15.5)
WBC: 10.4 10*3/uL (ref 4.0–10.5)

## 2014-02-11 MED ORDER — PRAVASTATIN SODIUM 20 MG PO TABS: 20.0000 mg | ORAL_TABLET | Freq: Every day | ORAL | Status: DC

## 2014-02-11 MED ORDER — BENAZEPRIL HCL 40 MG PO TABS: 40.0000 mg | ORAL_TABLET | Freq: Every day | ORAL | Status: DC

## 2014-02-11 MED ORDER — METFORMIN HCL ER 500 MG PO TB24: 500.0000 mg | ORAL_TABLET | Freq: Every day | ORAL | Status: DC

## 2014-02-11 MED ORDER — TRAMADOL HCL 50 MG PO TABS: 50.0000 mg | ORAL_TABLET | Freq: Every evening | ORAL | Status: DC | PRN

## 2014-02-11 MED ORDER — ESTRADIOL 0.025 MG/24HR TD PTTW: 1.0000 | MEDICATED_PATCH | TRANSDERMAL | Status: DC

## 2014-02-11 MED ORDER — OMEPRAZOLE 20 MG PO CPDR: 20.0000 mg | DELAYED_RELEASE_CAPSULE | Freq: Every day | ORAL | Status: DC

## 2014-02-11 MED ORDER — APIXABAN 5 MG PO TABS: 5.0000 mg | ORAL_TABLET | Freq: Two times a day (BID) | ORAL | Status: DC

## 2014-02-11 NOTE — Patient Instructions (Addendum)
You had the flu shot today  Please return in 2 wks for Nurse Visit for the new Prevnar pneumonia shot  Please remember to followup with your mammogram at the [facility] will be contacted regarding the referral for: GI for purpose of consideration for colonosocpy, as you are also on Eliquis long term  Please take all new medication as prescribed - the metformin  Please continue all other medications as before, and refills have been done if requested - the tramadol  Please have the pharmacy call with any other refills you may need.  Please continue your efforts at being more active, low cholesterol diet, and weight control.  You are otherwise up to date with prevention measures today.  Please keep your appointments with your specialists as you may have planned  You will be contacted regarding the referral for: Gastroenterology  Please go to the LAB in the Basement (turn left off the elevator) for the tests to be done today  You will be contacted by phone if any changes need to be made immediately.  Otherwise, you will receive a letter about your results with an explanation, but please check with MyChart first.  Please remember to sign up for MyChart if you have not done so, as this will be important to you in the future with finding out test results, communicating by private email, and scheduling acute appointments online when needed.  Please return in 6 months, or sooner if needed

## 2014-02-11 NOTE — Assessment & Plan Note (Signed)
Zalma for tramadol qid prn,  to f/u any worsening symptoms or concerns

## 2014-02-11 NOTE — Progress Notes (Signed)
Pre visit review using our clinic review tool, if applicable. No additional management support is needed unless otherwise documented below in the visit note. 

## 2014-02-11 NOTE — Assessment & Plan Note (Addendum)
For metformiin ER 500 qd,  to f/u any worsening symptoms or concerns  Note:  Total time for pt hx, exam, review of record with pt in the room, determination of diagnoses and plan for further eval and tx is > 40 min, with over 50% spent in coordination and counseling of patient

## 2014-02-11 NOTE — Assessment & Plan Note (Signed)
stable overall by history and exam, recent data reviewed with pt, and pt to continue medical treatment as before,  to f/u any worsening symptoms or concerns, for /fu labs  Lab Results  Component Value Date   LDLCALC 81 05/28/2012

## 2014-02-11 NOTE — Assessment & Plan Note (Signed)
Will need GI referral, as likely needs colonsocpy, but also on eliquis long term

## 2014-02-11 NOTE — Progress Notes (Signed)
Subjective:    Patient ID: Brianna Walker, female    DOB: [birth year], 68 y.o.   MRN: [id number]  HPI Here for yearly f/u;  Overall doing ok;  Pt denies CP, worsening SOB, DOE, wheezing, orthopnea, PND, worsening LE edema, palpitations, dizziness or syncope.  Pt denies neurological change such as new headache, facial or extremity weakness.  Pt denies polydipsia, polyuria, or low sugar symptoms. Pt states overall good compliance with treatment and medications, good tolerability, and has been trying to follow lower cholesterol diet.  Pt denies worsening depressive symptoms, suicidal ideation or panic. No fever, night sweats, wt loss, loss of appetite, or other constitutional symptoms.  Pt states good ability with ADL's, has low fall risk, home safety reviewed and adequate, no other significant changes in hearing or vision, and only occasionally active with exercise. On eliquis long term after DVT x 2 (first with PE). Recently with intermittent hematochezia she attributes to hemorrhoids Due for colonscopy.  Asks for metformin due to her metabolic syndrome and wants to lose wt. Overall pain controlled, though has chronic recurring right hip pain and other DJD of left mid foot, limits her activity.  Due for flu shot, prevnar and mammogram Past Medical History  Diagnosis Date  . ALLERGIC RHINITIS 09/12/2007  . BACK PAIN 06/19/2008  . Cramp of limb 08/11/2009  . DEEP VENOUS THROMBOPHLEBITIS, LEG, RIGHT 03/04/2010  . Long term (current) use of anticoagulants 11/29/2010  . FREQUENCY, URINARY 10/07/2009  . GERD 12/05/2006  . HEMORRHOIDS 11/17/2006  . HIP PAIN, RIGHT, CHRONIC 08/11/2009  . HX, PERSONAL, TUBERCULOSIS 11/17/2006  . HYPERLIPIDEMIA 09/12/2007  . HYPERTENSION 11/17/2006  . INSOMNIA-SLEEP DISORDER-UNSPEC 06/19/2008  . LEG PAIN, RIGHT 06/19/2008  . MASS, SUPERFICIAL 09/12/2007  . MENOPAUSAL DISORDER 09/20/2009  . OVERACTIVE BLADDER 03/04/2010  . PULMONARY EMBOLISM, HX OF 11/17/2006  . RECTAL BLEEDING 10/07/2007    . SCIATICA, RIGHT 09/12/2007  . SKIN LESION 07/20/2009  . TMJ SYNDROME 11/17/2006  . Iron deficiency anemia 12/07/2011  . Diverticulosis   . Hyperlipidemia 09/12/2007    Qualifier: Diagnosis of  By: Jenny Reichmann MD, Hunt Oris    Past Surgical History  Procedure Laterality Date  . Cholecystectomy    . Abdominal hysterectomy    . Inguinal herniorrhapy      multiple ventral hernia repair/mesh  . Tumor removed off of right shoulder    . S/p right hip replaced min invasive hip surgury [facility] oct 2011      reports that she has never smoked. She does not have any smokeless tobacco history on file. She reports that she does not drink alcohol or use illicit drugs. family history includes Colon cancer in her father. No Known Allergies Current Outpatient Prescriptions on File Prior to Visit  Medication Sig Dispense Refill  . aspirin 81 MG tablet Take 81 mg by mouth daily.        . ferrous sulfate 325 (65 FE) MG tablet Take 1 tablet (325 mg total) by mouth 2 (two) times daily.  180 tablet  3  . phentermine 37.5 MG capsule Take 1 capsule (37.5 mg total) by mouth every morning.  30 capsule  2  . triamcinolone (KENALOG) 0.1 % paste Use as directed 1 application in the mouth or throat 2 (two) times daily.  5 g  12   No current facility-administered medications on file prior to visit.   Review of Systems Constitutional: Negative for increased diaphoresis, other activity, appetite or other siginficant weight change  HENT: Negative  for worsening hearing loss, ear pain, facial swelling, mouth sores and neck stiffness.   Eyes: Negative for other worsening pain, redness or visual disturbance.  Respiratory: Negative for shortness of breath and wheezing.   Cardiovascular: Negative for chest pain and palpitations.  Gastrointestinal: Negative for diarrhea, blood in stool, abdominal distention or other pain Genitourinary: Negative for hematuria, flank pain or change in urine volume.  Musculoskeletal: Negative for  myalgias or other joint complaints.  Skin: Negative for color change and wound.  Neurological: Negative for syncope and numbness. other than noted Hematological: Negative for adenopathy. or other swelling Psychiatric/Behavioral: Negative for hallucinations, self-injury, decreased concentration or other worsening agitation.      Objective:   Physical Exam BP 120/78  Pulse 106  Temp(Src) 98.5 F (36.9 C) (Oral)  Ht 5' 5.5" (1.664 m)  Wt 214 lb 8 oz (97.297 kg)  BMI 35.14 kg/m2  SpO2 95% VS noted,  Constitutional: Pt is oriented to person, place, and time. Appears well-developed and well-nourished.  Head: Normocephalic and atraumatic.  Right Ear: External ear normal.  Left Ear: External ear normal.  Nose: Nose normal.  Mouth/Throat: Oropharynx is clear and moist.  Eyes: Conjunctivae and EOM are normal. Pupils are equal, round, and reactive to light.  Neck: Normal range of motion. Neck supple. No JVD present. No tracheal deviation present.  Cardiovascular: Normal rate, regular rhythm, normal heart sounds and intact distal pulses.   Pulmonary/Chest: Effort normal and breath sounds without rales or wheezing  Abdominal: Soft. Bowel sounds are normal. NT. No HSM  Musculoskeletal: Normal range of motion. Exhibits no edema.  Lymphadenopathy:  Has no cervical adenopathy.  Neurological: Pt is alert and oriented to person, place, and time. Pt has normal reflexes. No cranial nerve deficit. Motor grossly intact Skin: Skin is warm and dry. No rash noted.  Psychiatric:  Has normal mood and affect. Behavior is normal.     Assessment & Plan:

## 2014-02-12 ENCOUNTER — Other Ambulatory Visit: Payer: Self-pay | Admitting: Internal Medicine

## 2014-02-12 DIAGNOSIS — R7989 Other specified abnormal findings of blood chemistry: Secondary | ICD-10-CM

## 2014-02-12 LAB — BASIC METABOLIC PANEL
BUN: 13 mg/dL (ref 6–23)
CHLORIDE: 108 meq/L (ref 96–112)
CO2: 25 meq/L (ref 19–32)
Calcium: 9.4 mg/dL (ref 8.4–10.5)
Creatinine, Ser: 1 mg/dL (ref 0.4–1.2)
GFR: 70.48 mL/min (ref >60.00)
Glucose, Bld: 98 mg/dL (ref 70–99)
Potassium: 4 mEq/L (ref 3.5–5.1)
Sodium: 141 mEq/L (ref 135–145)

## 2014-02-12 LAB — HEPATIC FUNCTION PANEL
ALT: 12 U/L (ref 0–35)
AST: 21 U/L (ref 0–37)
Albumin: 3.4 g/dL — ABNORMAL LOW (ref 3.5–5.2)
Alkaline Phosphatase: 100 U/L (ref 39–117)
BILIRUBIN DIRECT: 0 mg/dL (ref 0.0–0.3)
TOTAL PROTEIN: 8.6 g/dL — AB (ref 6.0–8.3)
Total Bilirubin: 0.9 mg/dL (ref 0.2–1.2)

## 2014-02-12 LAB — LIPID PANEL
CHOLESTEROL: 156 mg/dL (ref 0–200)
HDL: 32.6 mg/dL — ABNORMAL LOW (ref >39.00)
NonHDL: 123.4
Total CHOL/HDL Ratio: 5
Triglycerides: 275 mg/dL — ABNORMAL HIGH (ref 0.0–149.0)
VLDL: 55 mg/dL — ABNORMAL HIGH (ref 0.0–40.0)

## 2014-02-12 LAB — IBC PANEL
Iron: 49 ug/dL (ref 42–145)
Saturation Ratios: 16.3 % — ABNORMAL LOW (ref 20.0–50.0)
TRANSFERRIN: 214.9 mg/dL (ref 212.0–360.0)

## 2014-02-12 LAB — TSH: TSH: 0.21 u[IU]/mL — ABNORMAL LOW (ref 0.35–4.50)

## 2014-02-12 LAB — LDL CHOLESTEROL, DIRECT: Direct LDL: 86.1 mg/dL

## 2014-02-24 ENCOUNTER — Encounter: Payer: Self-pay | Admitting: Gastroenterology

## 2014-02-25 ENCOUNTER — Ambulatory Visit: Payer: Medicare Other

## 2014-03-02 ENCOUNTER — Ambulatory Visit: Payer: Medicare Other | Admitting: Endocrinology

## 2014-03-05 ENCOUNTER — Encounter: Payer: Self-pay | Admitting: Endocrinology

## 2014-03-05 ENCOUNTER — Ambulatory Visit (INDEPENDENT_AMBULATORY_CARE_PROVIDER_SITE_OTHER): Payer: Medicare Other

## 2014-03-05 ENCOUNTER — Ambulatory Visit (INDEPENDENT_AMBULATORY_CARE_PROVIDER_SITE_OTHER): Payer: Medicare Other | Admitting: Endocrinology

## 2014-03-05 VITALS — BP 129/79 | HR 90 | Temp 98.7°F | Resp 16 | Ht 65.5 in | Wt 214.6 lb

## 2014-03-05 DIAGNOSIS — E042 Nontoxic multinodular goiter: Secondary | ICD-10-CM | POA: Diagnosis not present

## 2014-03-05 DIAGNOSIS — R7989 Other specified abnormal findings of blood chemistry: Secondary | ICD-10-CM | POA: Diagnosis not present

## 2014-03-05 DIAGNOSIS — Z23 Encounter for immunization: Secondary | ICD-10-CM | POA: Diagnosis not present

## 2014-03-05 LAB — T4, FREE: Free T4: 0.82 ng/dL (ref 0.60–1.60)

## 2014-03-05 LAB — T3, FREE: T3, Free: 3.1 pg/mL (ref 2.3–4.2)

## 2014-03-05 NOTE — Progress Notes (Signed)
Patient ID: Brianna Walker, female   DOB: [birth year], 68 y.o.   MRN: [id number]                                                                                                                Reason for Appointment:  ? Hyperthyroidism, new consultation  Referring physician: John   History of Present Illness:   The patient has had symptoms of shortness of breath on exertion which has been present for at least a year She gets dyspnea on climbing her stairs at home also She apparently had cardiac studies last year and these were negative She does tend to get more prominent heartbeat and palpitations when she is exerting herself She has not had any significant weight loss although with trying an OTC probiotic weight loss supplement she has lost 8 pounds She does not complain of any anxiety, shakiness or loose bowel movements She does periodically gets warm and sweaty and this has been going on for several years She tends to get tired easily, previously had been taking iron supplements   The patient was evaluated with thyroid function tests which showed the following:     Lab Results  Component Value Date   TSH 0.21* 02/11/2014   TSH 0.26* 01/22/2013   TSH 0.57 08/12/2012    Free T4 not available     Medication List       This list is accurate as of: 03/05/14  2:19 PM.  Always use your most recent med list.               apixaban 5 MG Tabs tablet  Commonly known as:  ELIQUIS  Take 1 tablet (5 mg total) by mouth 2 (two) times daily.     aspirin 81 MG tablet  Take 81 mg by mouth daily.     benazepril 40 MG tablet  Commonly known as:  LOTENSIN  Take 1 tablet (40 mg total) by mouth daily.     estradiol 0.025 MG/24HR  Commonly known as:  VIVELLE-DOT  Place 1 patch onto the skin 2 (two) times a week.     ferrous sulfate 325 (65 FE) MG tablet  Take 1 tablet (325 mg total) by mouth 2 (two) times daily.     metFORMIN 500 MG 24 hr tablet  Commonly known as:  GLUCOPHAGE-XR   Take 1 tablet (500 mg total) by mouth daily with breakfast.     omeprazole 20 MG capsule  Commonly known as:  PRILOSEC  Take 1 capsule (20 mg total) by mouth daily.     phentermine 37.5 MG capsule  Take 1 capsule (37.5 mg total) by mouth every morning.     pravastatin 20 MG tablet  Commonly known as:  PRAVACHOL  Take 1 tablet (20 mg total) by mouth daily.     traMADol 50 MG tablet  Commonly known as:  ULTRAM  Take 1 tablet (50 mg total) by mouth at bedtime as needed.     triamcinolone 0.1 % paste  Commonly  known as:  KENALOG  Use as directed 1 application in the mouth or throat 2 (two) times daily.            Past Medical History  Diagnosis Date  . ALLERGIC RHINITIS 09/12/2007  . BACK PAIN 06/19/2008  . Cramp of limb 08/11/2009  . DEEP VENOUS THROMBOPHLEBITIS, LEG, RIGHT 03/04/2010  . Long term (current) use of anticoagulants 11/29/2010  . FREQUENCY, URINARY 10/07/2009  . GERD 12/05/2006  . HEMORRHOIDS 11/17/2006  . HIP PAIN, RIGHT, CHRONIC 08/11/2009  . HX, PERSONAL, TUBERCULOSIS 11/17/2006  . HYPERLIPIDEMIA 09/12/2007  . HYPERTENSION 11/17/2006  . INSOMNIA-SLEEP DISORDER-UNSPEC 06/19/2008  . LEG PAIN, RIGHT 06/19/2008  . MASS, SUPERFICIAL 09/12/2007  . MENOPAUSAL DISORDER 09/20/2009  . OVERACTIVE BLADDER 03/04/2010  . PULMONARY EMBOLISM, HX OF 11/17/2006  . RECTAL BLEEDING 10/07/2007  . SCIATICA, RIGHT 09/12/2007  . SKIN LESION 07/20/2009  . TMJ SYNDROME 11/17/2006  . Iron deficiency anemia 12/07/2011  . Diverticulosis   . Hyperlipidemia 09/12/2007    Qualifier: Diagnosis of  By: Jenny Reichmann MD, Hunt Oris     Past Surgical History  Procedure Laterality Date  . Cholecystectomy    . Abdominal hysterectomy    . Inguinal herniorrhapy      multiple ventral hernia repair/mesh  . Tumor removed off of right shoulder    . S/p right hip replaced min invasive hip surgury [facility] oct 2011      Family History  Problem Relation Age of Onset  . Colon cancer Father     Social History:  reports  that she has never smoked. She does not have any smokeless tobacco history on file. She reports that she does not drink alcohol or use illicit drugs.  Allergies: No Known Allergies  Review of Systems:  Weight recently better with ProBioslim OTC probiotic.  She had tried phentermine but did not like it because of lack of efficacy  Wt Readings from Last 3 Encounters:  03/05/14 214 lb 9.6 oz (97.342 kg)  02/11/14 214 lb 8 oz (97.297 kg)  07/18/13 218 lb 12 oz (99.224 kg)    Has a history of high blood pressure treated with Lotensin.       Has history of dyspnea on exertion 6-12 mths.      No complaints of change in bowel habits.      Negative  history of Diabetes.     No swelling of feet         Examination:   BP 129/79 mmHg  Pulse 90  Temp(Src) 98.7 F (37.1 C)  Resp 16  Ht 5' 5.5" (1.664 m)  Wt 214 lb 9.6 oz (97.342 kg)  BMI 35.16 kg/m2  SpO2 97%   General Appearance: generalized obesity present,  well nourished, pleasant, not anxious or hyperkinetic..        Eyes: No excessive prominence, lid lag or stare. No swelling of the eyelids  Neck: The thyroid is not clearly palpable.  She does have mild nodularity in the thyroid laterally on each side, probably has a 1 cm smooth mobile firm nodule in the lateral lobes There is no lymphadenopathy .          Heart: normal S1 and S2, no murmurs .         Lungs: breath sounds are clear bilaterally Abdomen no hepatosplenomegaly  Extremities: hands are not unusually warm. No ankle edema. Neurological: REFLEXES: at biceps are normal No tremor is present   Assessment/Plan:  Mildly suppressed TSH likely to represent  an autonomous thyroid Her TSH is not in the hyperthyroid range She does not have any typical symptoms of hyperthyroidism Also her TSH is the same as about a year ago She appears to have a nodularity of her lateral thyroid lobes and may have a small multinodular goiter She apparently does have a family History of unknown  thyroid disease  Her main symptom is shortness of breath on exertion which is unlikely to be related to her abnormal TSH  She will have free T4 and T3 levels checked today to assess her thyroid function If her thyroid levels are normal will follow-up in 6 months Consider thyroid scan and uptake if either of the tests are high  Iron deficiency: She has a low saturation and advised her to start taking OTC iron to build up her iron stores, this may improve her subjectively also   Cobe Viney 03/05/2014, 2:19 PM

## 2014-03-05 NOTE — Progress Notes (Signed)
Quick Note:  Please let patient know that the Thyroid hormone result is normal and no further action needed, follow-up in 6 months, discuss symptoms with PCP ______

## 2014-03-09 ENCOUNTER — Encounter: Payer: Self-pay | Admitting: *Deleted

## 2014-04-27 ENCOUNTER — Ambulatory Visit: Payer: Medicare Other | Admitting: Gastroenterology

## 2014-05-01 DIAGNOSIS — D126 Benign neoplasm of colon, unspecified: Secondary | ICD-10-CM

## 2014-05-01 HISTORY — DX: Benign neoplasm of colon, unspecified: D12.6

## 2014-06-09 ENCOUNTER — Encounter: Payer: Self-pay | Admitting: Gastroenterology

## 2014-06-09 ENCOUNTER — Ambulatory Visit (INDEPENDENT_AMBULATORY_CARE_PROVIDER_SITE_OTHER): Payer: Commercial Managed Care - HMO | Admitting: Gastroenterology

## 2014-06-09 ENCOUNTER — Telehealth: Payer: Self-pay

## 2014-06-09 VITALS — BP 136/92 | HR 80 | Ht 63.5 in | Wt 211.5 lb

## 2014-06-09 DIAGNOSIS — Z800 Family history of malignant neoplasm of digestive organs: Secondary | ICD-10-CM | POA: Diagnosis not present

## 2014-06-09 DIAGNOSIS — Z7901 Long term (current) use of anticoagulants: Secondary | ICD-10-CM

## 2014-06-09 DIAGNOSIS — K625 Hemorrhage of anus and rectum: Secondary | ICD-10-CM

## 2014-06-09 DIAGNOSIS — K648 Other hemorrhoids: Secondary | ICD-10-CM

## 2014-06-09 MED ORDER — PEG-KCL-NACL-NASULF-NA ASC-C 100 G PO SOLR: 1.0000 | Freq: Once | ORAL | Status: DC

## 2014-06-09 NOTE — Telephone Encounter (Signed)
06/09/2014   RE: Brianna Walker DOB: 1945/06/04 MRN: [id number]   Dear Dr. Jenny Reichmann,    We have scheduled the above patient for an endoscopic procedure. Our records show that she is on anticoagulation therapy.   Please advise as to how long the patient may come off her therapy of Eliquis prior to the procedure, which is scheduled for 07/21/14.  Please route the answer to Marlon Pel, CMA to Lucio Edward, MD    Sincerely,    Marlon Pel, CMA

## 2014-06-09 NOTE — Patient Instructions (Addendum)
You have been scheduled for a colonoscopy. Please follow written instructions given to you at your visit today.  Please pick up your prep kit at the pharmacy within the next 1-3 days. If you use inhalers (even only as needed), please bring them with you on the day of your procedure. Your physician has requested that you go to www.startemmi.com and enter the access code given to you at your visit today. This web site gives a general overview about your procedure. However, you should still follow specific instructions given to you by our office regarding your preparation for the procedure.  You may have a light breakfast the morning of prep day (the day before the procedure). You may choose from: eggs, toast, chicken noodle soup, crackers.  You should have your breakfast completed between 8:00 and 9:00 am.  Clear liquids only for the rest of the day on prep day and up until 3 hours before procedure.   Thank you for choosing me and [facility].  Pricilla Riffle. Dagoberto Ligas., MD., Marval Regal

## 2014-06-09 NOTE — Progress Notes (Signed)
History of Present Illness: This is a 69 year old female referred by Dr. Cathlean Cower for evaluation of frequent rectal bleeding with bowel movements and a change in her stools to flatter stools. Both symptoms have been ongoing for the past several months. She previously underwent colonoscopy in 09/2007 which showed large internal hemorrhoids which were injected, diverticulosis and hyperplastic colon polyp. Following that she had no symptoms from her hemorrhoids until about 6 months ago. She is maintained on Eliquis for a history of DVTs and PE. Denies weight loss, abdominal pain, constipation, diarrhea, change in stool caliber, melena, nausea, vomiting, dysphagia, reflux symptoms, chest pain.  No Known Allergies Outpatient Prescriptions Prior to Visit  Medication Sig Dispense Refill  . apixaban (ELIQUIS) 5 MG TABS tablet Take 1 tablet (5 mg total) by mouth 2 (two) times daily. 60 tablet 11  . aspirin 81 MG tablet Take 81 mg by mouth daily.      . benazepril (LOTENSIN) 40 MG tablet Take 1 tablet (40 mg total) by mouth daily. 90 tablet 3  . estradiol (VIVELLE-DOT) 0.025 MG/24HR Place 1 patch onto the skin 2 (two) times a week. 8 patch 12  . ferrous sulfate 325 (65 FE) MG tablet Take 1 tablet (325 mg total) by mouth 2 (two) times daily. 180 tablet 3  . omeprazole (PRILOSEC) 20 MG capsule Take 1 capsule (20 mg total) by mouth daily. 90 capsule 3  . pravastatin (PRAVACHOL) 20 MG tablet Take 1 tablet (20 mg total) by mouth daily. 90 tablet 3  . traMADol (ULTRAM) 50 MG tablet Take 1 tablet (50 mg total) by mouth at bedtime as needed. 30 tablet 5  . triamcinolone (KENALOG) 0.1 % paste Use as directed 1 application in the mouth or throat 2 (two) times daily. 5 g 12  . metFORMIN (GLUCOPHAGE-XR) 500 MG 24 hr tablet Take 1 tablet (500 mg total) by mouth daily with breakfast. 90 tablet 3   No facility-administered medications prior to visit.   Past Medical History  Diagnosis Date  . ALLERGIC RHINITIS  09/12/2007  . BACK PAIN 06/19/2008  . Cramp of limb 08/11/2009  . DEEP VENOUS THROMBOPHLEBITIS, LEG, RIGHT 03/04/2010  . Long term (current) use of anticoagulants 11/29/2010  . FREQUENCY, URINARY 10/07/2009  . GERD 12/05/2006  . HEMORRHOIDS 11/17/2006  . HIP PAIN, RIGHT, CHRONIC 08/11/2009  . HX, PERSONAL, TUBERCULOSIS 11/17/2006  . HYPERLIPIDEMIA 09/12/2007  . HYPERTENSION 11/17/2006  . INSOMNIA-SLEEP DISORDER-UNSPEC 06/19/2008  . LEG PAIN, RIGHT 06/19/2008  . MASS, SUPERFICIAL 09/12/2007  . MENOPAUSAL DISORDER 09/20/2009  . OVERACTIVE BLADDER 03/04/2010  . PULMONARY EMBOLISM, HX OF 11/17/2006  . RECTAL BLEEDING 10/07/2007  . SCIATICA, RIGHT 09/12/2007  . SKIN LESION 07/20/2009  . TMJ SYNDROME 11/17/2006  . Iron deficiency anemia 12/07/2011  . Diverticulosis   . Hyperlipidemia 09/12/2007    Qualifier: Diagnosis of  By: Jenny Reichmann MD, Hunt Oris   . Arthritis   . Colon polyps   . Pneumonia    Past Surgical History  Procedure Laterality Date  . Cholecystectomy    . Abdominal hysterectomy    . Ventral hernia repair      multiple ventral hernia repair/mesh  . Tumor removed off of right shoulder Right   . S/p right hip replaced min invasive hip surgury [facility] oct 2011 Right 2011   History   Social History  . Marital Status: Married    Spouse Name: N/A    Number of Children: 2  . Years of Education: N/A  Occupational History  . retired    Social History Main Topics  . Smoking status: Former Smoker    Types: Cigarettes    Quit date: 06/09/1977  . Smokeless tobacco: Never Used  . Alcohol Use: 0.0 oz/week    0 Not specified per week     Comment: rarley  . Drug Use: No     Comment: past marijuana  . Sexual Activity: None   Other Topics Concern  . 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 Colon cancer Father   . Heart disease Mother   . Heart disease Sister   . Diabetes Sister       Review of Systems: Pertinent  positive and negative review of systems were noted in the above HPI section. All other review of systems were otherwise negative.    Physical Exam: General: Well developed, well nourished, no acute distress Head: Normocephalic and atraumatic Eyes:  sclerae anicteric, EOMI Ears: Normal auditory acuity Mouth: No deformity or lesions Neck: Supple, no masses or thyromegaly Lungs: Clear throughout to auscultation Heart: Regular rate and rhythm; no murmurs, rubs or bruits Abdomen: Soft, non tender and non distended. No masses, hepatosplenomegaly or hernias noted. Normal Bowel sounds Rectal: deferred to colonoscopy Musculoskeletal: Symmetrical with no gross deformities  Skin: No lesions on visible extremities Pulses:  Normal pulses noted Extremities: No clubbing, cyanosis, edema or deformities noted Neurological: Alert oriented x 4, grossly nonfocal Cervical Nodes:  No significant cervical adenopathy Inguinal Nodes: No significant inguinal adenopathy Psychological:  Alert and cooperative. Normal mood and affect  Assessment and Recommendations:  1. Hematochezia, change in stool caliber, family history of colon cancer. Rule out hemorrhoids, colorectal neoplasms and other disorders. She was due surveillance colonoscopy in 09/2012. The risks, benefits, and alternatives to colonoscopy with possible biopsy, possible injection of hemorrhoids and possible polypectomy were discussed with the patient and they consent to proceed. Consider hemorrhoidal banding as well.   2. History of DVT x 2 and PE on Eliquis. Hold Eliquis for 2 days prior to colonoscopy. OK this plan with message to Dr. Jenny Reichmann.   3. GERD. Continue omeprazole 20 mg daily and standard antireflux measures.   cc: Biagio Borg, Md [address] [place], [place] [address]

## 2014-06-16 NOTE — Telephone Encounter (Signed)
OK to hold Eliquis x 48 hrs prior to procedure  Then resume post procedure, unless otherwise indicated per Dr Fuller Plan

## 2014-06-16 NOTE — Telephone Encounter (Signed)
Patient notified of Dr. Gwynn Burly recommendations and pt verbalized understanding.

## 2014-06-30 DIAGNOSIS — K6289 Other specified diseases of anus and rectum: Secondary | ICD-10-CM

## 2014-06-30 HISTORY — DX: Other specified diseases of anus and rectum: K62.89

## 2014-07-01 ENCOUNTER — Other Ambulatory Visit: Payer: Self-pay | Admitting: Internal Medicine

## 2014-07-01 DIAGNOSIS — Z0279 Encounter for issue of other medical certificate: Secondary | ICD-10-CM

## 2014-07-01 DIAGNOSIS — R921 Mammographic calcification found on diagnostic imaging of breast: Secondary | ICD-10-CM

## 2014-07-01 DIAGNOSIS — N63 Unspecified lump in unspecified breast: Secondary | ICD-10-CM

## 2014-07-06 ENCOUNTER — Encounter: Payer: Self-pay | Admitting: Gastroenterology

## 2014-07-20 ENCOUNTER — Ambulatory Visit
Admission: RE | Admit: 2014-07-20 | Discharge: 2014-07-20 | Disposition: A | Discharge status: Home / Self Care | Payer: Commercial Managed Care - HMO | Source: Ambulatory Visit | Attending: Internal Medicine | Admitting: Internal Medicine

## 2014-07-20 DIAGNOSIS — R921 Mammographic calcification found on diagnostic imaging of breast: Secondary | ICD-10-CM

## 2014-07-20 DIAGNOSIS — N63 Unspecified lump in breast: Secondary | ICD-10-CM | POA: Diagnosis not present

## 2014-07-21 ENCOUNTER — Other Ambulatory Visit: Payer: Self-pay

## 2014-07-21 ENCOUNTER — Encounter: Payer: Self-pay | Admitting: Gastroenterology

## 2014-07-21 ENCOUNTER — Encounter: Payer: Commercial Managed Care - HMO | Admitting: Gastroenterology

## 2014-07-21 ENCOUNTER — Ambulatory Visit (AMBULATORY_SURGERY_CENTER): Payer: Commercial Managed Care - HMO | Admitting: Gastroenterology

## 2014-07-21 ENCOUNTER — Other Ambulatory Visit (INDEPENDENT_AMBULATORY_CARE_PROVIDER_SITE_OTHER): Payer: Commercial Managed Care - HMO

## 2014-07-21 VITALS — BP 145/85 | HR 61 | Temp 98.5°F | Resp 22 | Ht 63.0 in | Wt 211.0 lb

## 2014-07-21 DIAGNOSIS — R194 Change in bowel habit: Secondary | ICD-10-CM | POA: Diagnosis not present

## 2014-07-21 DIAGNOSIS — Z800 Family history of malignant neoplasm of digestive organs: Secondary | ICD-10-CM

## 2014-07-21 DIAGNOSIS — R198 Other specified symptoms and signs involving the digestive system and abdomen: Secondary | ICD-10-CM | POA: Diagnosis not present

## 2014-07-21 DIAGNOSIS — D124 Benign neoplasm of descending colon: Secondary | ICD-10-CM

## 2014-07-21 DIAGNOSIS — K623 Rectal prolapse: Secondary | ICD-10-CM | POA: Diagnosis not present

## 2014-07-21 DIAGNOSIS — D125 Benign neoplasm of sigmoid colon: Secondary | ICD-10-CM

## 2014-07-21 DIAGNOSIS — K625 Hemorrhage of anus and rectum: Secondary | ICD-10-CM | POA: Diagnosis not present

## 2014-07-21 DIAGNOSIS — D123 Benign neoplasm of transverse colon: Secondary | ICD-10-CM | POA: Diagnosis not present

## 2014-07-21 DIAGNOSIS — K921 Melena: Secondary | ICD-10-CM

## 2014-07-21 DIAGNOSIS — I10 Essential (primary) hypertension: Secondary | ICD-10-CM | POA: Diagnosis not present

## 2014-07-21 DIAGNOSIS — K6289 Other specified diseases of anus and rectum: Secondary | ICD-10-CM

## 2014-07-21 LAB — BUN: BUN: 6 mg/dL (ref 6–23)

## 2014-07-21 LAB — CREATININE, SERUM: Creatinine, Ser: 0.78 mg/dL (ref 0.40–1.20)

## 2014-07-21 MED ORDER — SODIUM CHLORIDE 0.9 % IV SOLN: 500.0000 mL | INTRAVENOUS | Status: DC

## 2014-07-21 NOTE — Progress Notes (Signed)
Per Dr. Norberto Sorenson send path specimens [facility].  Walkers will be notified after I given report to Ernestine Conrad, RN recovery room nurse. maw

## 2014-07-21 NOTE — Progress Notes (Signed)
Called to room to assist during endoscopic procedure.  Patient ID and intended procedure confirmed with present staff. Received instructions for my participation in the procedure from the performing physician.  

## 2014-07-21 NOTE — Patient Instructions (Addendum)
YOU HAD AN ENDOSCOPIC PROCEDURE TODAY AT [facility]:   Refer to the procedure report that was given to you for any specific questions about what was found during the examination.  If the procedure report does not answer your questions, please call your gastroenterologist to clarify.  If you requested that your care partner not be given the details of your procedure findings, then the procedure report has been included in a sealed envelope for you to review at your convenience later.  YOU SHOULD EXPECT: Some feelings of bloating in the abdomen. Passage of more gas than usual.  Walking can help get rid of the air that was put into your GI tract during the procedure and reduce the bloating. If you had a lower endoscopy (such as a colonoscopy or flexible sigmoidoscopy) you may notice spotting of blood in your stool or on the toilet paper. If you underwent a bowel prep for your procedure, you may not have a normal bowel movement for a few days.  Please Note:  You might notice some irritation and congestion in your nose or some drainage.  This is from the oxygen used during your procedure.  There is no need for concern and it should clear up in a day or so.  SYMPTOMS TO REPORT IMMEDIATELY:   Following lower endoscopy (colonoscopy or flexible sigmoidoscopy):  Excessive amounts of blood in the stool  Significant tenderness or worsening of abdominal pains  Swelling of the abdomen that is new, acute  Fever of 100F or higher   For urgent or emergent issues, a gastroenterologist can be reached at any hour by calling [phone].   DIET: Your first meal following the procedure should be a small meal and then it is ok to progress to your normal diet. Heavy or fried foods are harder to digest and may make you feel nauseous or bloated.  Likewise, meals heavy in dairy and vegetables can increase bloating.  Drink plenty of fluids but you should avoid alcoholic beverages for 24  hours.  ACTIVITY:  You should plan to take it easy for the rest of today and you should NOT DRIVE or use heavy machinery until tomorrow (because of the sedation medicines used during the test).    FOLLOW UP: Our staff will call the number listed on your records the next business day following your procedure to check on you and address any questions or concerns that you may have regarding the information given to you following your procedure. If we do not reach you, we will leave a message.  However, if you are feeling well and you are not experiencing any problems, there is no need to return our call.  We will assume that you have returned to your regular daily activities without incident.  If any biopsies were taken you will be contacted by phone or by letter within the next 1-3 weeks.  Please call us at [phone] if you have not heard about the biopsies in 3 weeks.    SIGNATURES/CONFIDENTIALITY: You and/or your care partner have signed paperwork which will be entered into your electronic medical record.  These signatures attest to the fact that that the information above on your After Visit Summary has been reviewed and is understood.  Full responsibility of the confidentiality of this discharge information lies with you and/or your care-partner.  The office staff will call you with the CT time and date.  Your recovery room nurse has given you the contrast you must  drink before the test.  The office staff will tell you when you should drink each bottle.  The office will also arrange a surgical consult for you. Don't hesitate to call us if you have questions.  Start your Eliquis Friday per Dr. Fuller Plan.

## 2014-07-21 NOTE — Progress Notes (Signed)
To recovery, report to Hodges,RN. VSS 

## 2014-07-21 NOTE — Progress Notes (Signed)
Patient scheduled for CT at 1pm tomorrow.  Has contrast and times to drink and NPO instructions. Brianna Walker will call if pre-cert doesn't go through today.

## 2014-07-22 ENCOUNTER — Encounter: Payer: Self-pay | Admitting: Gastroenterology

## 2014-07-22 ENCOUNTER — Telehealth: Payer: Self-pay | Admitting: *Deleted

## 2014-07-22 ENCOUNTER — Ambulatory Visit (INDEPENDENT_AMBULATORY_CARE_PROVIDER_SITE_OTHER)
Admission: RE | Admit: 2014-07-22 | Discharge: 2014-07-22 | Disposition: A | Discharge status: Home / Self Care | Payer: Commercial Managed Care - HMO | Source: Ambulatory Visit | Attending: Gastroenterology | Admitting: Gastroenterology

## 2014-07-22 ENCOUNTER — Telehealth: Payer: Self-pay

## 2014-07-22 DIAGNOSIS — R198 Other specified symptoms and signs involving the digestive system and abdomen: Secondary | ICD-10-CM | POA: Diagnosis not present

## 2014-07-22 DIAGNOSIS — K6289 Other specified diseases of anus and rectum: Secondary | ICD-10-CM

## 2014-07-22 MED ORDER — IOHEXOL 300 MG/ML  SOLN
100.0000 mL | Freq: Once | INTRAMUSCULAR | Status: AC | PRN
  Administered 2014-07-22: 100 mL via INTRAVENOUS

## 2014-07-22 NOTE — Telephone Encounter (Signed)
  Follow up Call-  Call back number 07/21/2014  Post procedure Call Back phone  # [phone]  Permission to leave phone message Yes     Patient questions:  Do you have a fever, pain , or abdominal swelling? No. Pain Score  0 *  Have you tolerated food without any problems? Yes.    Have you been able to return to your normal activities? Yes.    Do you have any questions about your discharge instructions: Diet   No. Medications  No. Follow up visit  No.  Do you have questions or concerns about your Care? No.  Actions: * If pain score is 4 or above: No action needed, pain <4.

## 2014-07-22 NOTE — Telephone Encounter (Signed)
Patient is scheduled for an appt with [facility] Dr. Johney Maine for 08/12/14 at 9:30.  She is notified to arrive at 9:00,  bring a list of current medications, insurance information,  and a photo ID.

## 2014-07-23 ENCOUNTER — Telehealth: Payer: Self-pay

## 2014-07-23 NOTE — Telephone Encounter (Signed)
-----   Message from Milus Banister, MD sent at 07/23/2014  8:53 AM EDT ----- Brianna Walker, Big mass, not sure what it is.  I saw she is already scheduled to meet Neysa Bonito in [facility] April. I think EUS, FNA will probably be helpful. If you can let her know and then forward to Apoorva Bugay that she is aware of plan. thanks  Jaan Fischel, After you hear from Dr. Fuller Plan, please set her up for lower EUS, radial + linear, 2pm MAC spot on April 7th (my next Thursday for EUS since I am out of town next week).  Thanks    ----- Message -----    From: Ladene Artist, MD    Sent: 07/22/2014   4:57 PM      To: Milus Banister, MD  CT findings: Soft tissue mass involves the rectum and anus. There is likely a component of pelvic floor laxity and possible rectal prolapse. Mass measures on the order of 8.2 x 9.3 cm. Given above limitations, no perirectal adenopathy is identified. EUS next?  ----- Message -----    From: Milus Banister, MD    Sent: 07/22/2014   1:08 PM      To: Ladene Artist, MD  Probably could help. Let me know what the CT scan shows, will decide on EUS after that.   I bet the scan is going to be pretty impressive based on your colonoscopy pictures.  Interesting.   dj  ----- Message -----    From: Ladene Artist, MD    Sent: 07/22/2014   1:00 PM      To: Milus Banister, MD  Dan,   This patient has a 4 x 3 cm firm rectal submucosal mass found on colonoscopy yesterday. It was located on the anterior wall and extended to the anal verge and it fills over 50% of the normal volume of the rectal vault. Biopsies showed changes of prolapse but this likely did not sample the submucosal lesion. She is set up for a CT scan and surgical consult. Do you think EUS could help with a diagnosis?  Brianna Walker

## 2014-07-27 ENCOUNTER — Telehealth: Payer: Self-pay | Admitting: Gastroenterology

## 2014-07-27 DIAGNOSIS — K625 Hemorrhage of anus and rectum: Secondary | ICD-10-CM

## 2014-07-27 NOTE — Telephone Encounter (Signed)
She needs a CBC

## 2014-07-27 NOTE — Telephone Encounter (Signed)
Pt aware and states she will come this afternoon or tomorrow. Order in epic.

## 2014-07-27 NOTE — Telephone Encounter (Signed)
Brianna Walker pt calling, states she had colon done 07/21/14 for rectal bleeding to try and stop the bleeding. Pt states a mass was found when colon was done and she has continued to have bleeding when she has a BM. States she was told to call if the bleeding did not stop. Pt states it is BRB and that there is a lot of blood that comes out of her rectum when having BM, almost like she is "urinating out of the rectum." Planning on EUS with Dr. Ardis Hughs on April 7th for this pt and she has an appt already scheduled with [facility] 08/12/14. Dr. Deatra Ina as doc of the day please advise.

## 2014-07-28 ENCOUNTER — Other Ambulatory Visit (INDEPENDENT_AMBULATORY_CARE_PROVIDER_SITE_OTHER): Payer: Commercial Managed Care - HMO

## 2014-07-28 DIAGNOSIS — K625 Hemorrhage of anus and rectum: Secondary | ICD-10-CM | POA: Diagnosis not present

## 2014-07-28 LAB — CBC WITH DIFFERENTIAL/PLATELET
Basophils Absolute: 0 10*3/uL (ref 0.0–0.1)
Basophils Relative: 0.3 % (ref 0.0–3.0)
EOS PCT: 2.1 % (ref 0.0–5.0)
Eosinophils Absolute: 0.2 10*3/uL (ref 0.0–0.7)
HEMATOCRIT: 37 % (ref 36.0–46.0)
Hemoglobin: 12.4 g/dL (ref 12.0–15.0)
Lymphocytes Relative: 20.6 % (ref 12.0–46.0)
Lymphs Abs: 1.7 10*3/uL (ref 0.7–4.0)
MCHC: 33.5 g/dL (ref 30.0–36.0)
MCV: 78.6 fl (ref 78.0–100.0)
MONOS PCT: 4.2 % (ref 3.0–12.0)
Monocytes Absolute: 0.3 10*3/uL (ref 0.1–1.0)
Neutro Abs: 5.9 10*3/uL (ref 1.4–7.7)
Neutrophils Relative %: 72.8 % (ref 43.0–77.0)
Platelets: 348 10*3/uL (ref 150.0–400.0)
RBC: 4.71 Mil/uL (ref 3.87–5.11)
RDW: 16.5 % — ABNORMAL HIGH (ref 11.5–15.5)
WBC: 8.1 10*3/uL (ref 4.0–10.5)

## 2014-07-30 ENCOUNTER — Other Ambulatory Visit: Payer: Self-pay

## 2014-07-30 ENCOUNTER — Encounter (HOSPITAL_COMMUNITY): Payer: Self-pay | Admitting: *Deleted

## 2014-07-30 DIAGNOSIS — K6289 Other specified diseases of anus and rectum: Secondary | ICD-10-CM

## 2014-07-31 ENCOUNTER — Telehealth: Payer: Self-pay

## 2014-07-31 NOTE — Telephone Encounter (Signed)
OK for stopping Eliquis 48 hrs prior to procedure         Thanks Cathlean Cower MD    ----- Message -----     From: Barron Alvine, CMA     Sent: 07/30/2014 11:36 AM      To: Biagio Borg, MD

## 2014-07-31 NOTE — Telephone Encounter (Signed)
Pt has been notified to hold eliquis 48 hours prior to procdure

## 2014-08-06 ENCOUNTER — Encounter (HOSPITAL_COMMUNITY): Payer: Self-pay | Admitting: Gastroenterology

## 2014-08-06 ENCOUNTER — Ambulatory Visit (HOSPITAL_COMMUNITY)
Admission: RE | Admit: 2014-08-06 | Discharge: 2014-08-06 | Disposition: A | Discharge status: Home / Self Care | Payer: Commercial Managed Care - HMO | Source: Ambulatory Visit | Attending: Gastroenterology | Admitting: Gastroenterology

## 2014-08-06 ENCOUNTER — Ambulatory Visit (HOSPITAL_COMMUNITY): Payer: Commercial Managed Care - HMO | Admitting: Anesthesiology

## 2014-08-06 ENCOUNTER — Encounter (HOSPITAL_COMMUNITY)
Admission: RE | Disposition: A | Discharge status: Home / Self Care | Payer: Commercial Managed Care - HMO | Source: Ambulatory Visit | Attending: Gastroenterology

## 2014-08-06 DIAGNOSIS — K219 Gastro-esophageal reflux disease without esophagitis: Secondary | ICD-10-CM | POA: Insufficient documentation

## 2014-08-06 DIAGNOSIS — C20 Malignant neoplasm of rectum: Secondary | ICD-10-CM | POA: Insufficient documentation

## 2014-08-06 DIAGNOSIS — I10 Essential (primary) hypertension: Secondary | ICD-10-CM | POA: Diagnosis not present

## 2014-08-06 DIAGNOSIS — Z90710 Acquired absence of both cervix and uterus: Secondary | ICD-10-CM | POA: Insufficient documentation

## 2014-08-06 DIAGNOSIS — Z86711 Personal history of pulmonary embolism: Secondary | ICD-10-CM | POA: Diagnosis not present

## 2014-08-06 DIAGNOSIS — E669 Obesity, unspecified: Secondary | ICD-10-CM | POA: Insufficient documentation

## 2014-08-06 DIAGNOSIS — E785 Hyperlipidemia, unspecified: Secondary | ICD-10-CM | POA: Insufficient documentation

## 2014-08-06 DIAGNOSIS — R198 Other specified symptoms and signs involving the digestive system and abdomen: Secondary | ICD-10-CM | POA: Diagnosis not present

## 2014-08-06 DIAGNOSIS — Z86010 Personal history of colonic polyps: Secondary | ICD-10-CM | POA: Diagnosis not present

## 2014-08-06 DIAGNOSIS — Z7901 Long term (current) use of anticoagulants: Secondary | ICD-10-CM | POA: Diagnosis not present

## 2014-08-06 DIAGNOSIS — Z87891 Personal history of nicotine dependence: Secondary | ICD-10-CM | POA: Insufficient documentation

## 2014-08-06 DIAGNOSIS — K6289 Other specified diseases of anus and rectum: Secondary | ICD-10-CM | POA: Diagnosis present

## 2014-08-06 DIAGNOSIS — D490 Neoplasm of unspecified behavior of digestive system: Secondary | ICD-10-CM | POA: Diagnosis not present

## 2014-08-06 HISTORY — PX: EUS: SHX5427

## 2014-08-06 HISTORY — DX: Other specified diseases of anus and rectum: K62.89

## 2014-08-06 SURGERY — ULTRASOUND, LOWER GI TRACT, ENDOSCOPIC
Anesthesia: Monitor Anesthesia Care

## 2014-08-06 MED ORDER — MIDAZOLAM HCL 10 MG/2ML IJ SOLN
INTRAMUSCULAR | Status: DC | PRN
  Administered 2014-08-06: 2 mg via INTRAVENOUS
  Administered 2014-08-06: 1 mg via INTRAVENOUS

## 2014-08-06 MED ORDER — FENTANYL CITRATE 0.05 MG/ML IJ SOLN: 25.0000 ug | INTRAMUSCULAR | Status: DC | PRN

## 2014-08-06 MED ORDER — MIDAZOLAM HCL 10 MG/2ML IJ SOLN
INTRAMUSCULAR | Status: AC
  Filled 2014-08-06: qty 2

## 2014-08-06 MED ORDER — LACTATED RINGERS IV SOLN
INTRAVENOUS | Status: DC
Start: 2014-08-06 — End: 2014-08-06

## 2014-08-06 MED ORDER — SODIUM CHLORIDE 0.9 % IV SOLN
INTRAVENOUS | Status: DC
  Administered 2014-08-06: 500 mL via INTRAVENOUS

## 2014-08-06 MED ORDER — FENTANYL CITRATE 0.05 MG/ML IJ SOLN
INTRAMUSCULAR | Status: DC | PRN
  Administered 2014-08-06 (×2): 25 ug via INTRAVENOUS

## 2014-08-06 MED ORDER — FENTANYL CITRATE 0.05 MG/ML IJ SOLN
INTRAMUSCULAR | Status: AC
  Filled 2014-08-06: qty 2

## 2014-08-06 MED ORDER — DIPHENHYDRAMINE HCL 50 MG/ML IJ SOLN
INTRAMUSCULAR | Status: AC
  Filled 2014-08-06: qty 1

## 2014-08-06 NOTE — Op Note (Signed)
[facility] [place], [address]   ENDOSCOPIC ULTRASOUND PROCEDURE REPORT  PATIENT: Brianna Walker, Brianna Walker  MR#: [id number] BIRTHDATE: 1946/04/04  GENDER: female ENDOSCOPIST: Milus Banister, MD REFERRED BY:  Ladene Artist, M.D, [facility] PROCEDURE DATE:  08/06/2014 PROCEDURE:   Lower EUS, with FNA ASA CLASS:      Class II INDICATIONS:   1.  large perirectal mass noted on recent colonsocopy and confirmed on CT. MEDICATIONS: Fentanyl 50 mcg IV and Versed 3 mg IV  DESCRIPTION OF PROCEDURE:   After the risks benefits and alternatives of the procedure were  explained, informed consent was obtained. The patient was then placed in the left, lateral, decubitus postion and IV sedation was administered. Throughout the procedure, the patients blood pressure, pulse and oxygen saturations were monitored continuously.  Under direct visualization, the Pentax Radial EUS [id number]  endoscope was introduced through the anus  and advanced to the mid rectum.  Water was used as necessary to provide an acoustic interface.  Upon completion of the imaging, water was removed and the patient was sent to the recovery room in satisfactory condition.  Sigmoidoscopic findings: 1. The rectal lumen was extrinsically compressed but allowed insertion of the echoendoscopes into the distal rectum  EUS findings: 1. There was a large, hypoechoic, heterogeneous mass directly abutting the distal rectal wall. This measured at least 7cm on [place]. I did not see clear communication with the rectal wall, but given it's large size the evaluation was incomplete.  The mass was sampled with 2 transrectal EUS FNA passes with a 22 gauge EUS FNA needle  ENDOSCOPIC IMPRESSION: Large perirectal mass, unclear if this originates from the wall of the distall rectum. The mass was sampled with EUS/FNA. Preliminary cytology review suggests spindle cell neoplasm.  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 She is already set  to meet with Dr. Johney Maine next week to consider resection.  RECOMMENDATIONS: Await final pathology.  _______________________________ eSignedMilus Banister, MD 08/06/2014 11:22 AM

## 2014-08-06 NOTE — Discharge Instructions (Signed)
YOU HAD AN ENDOSCOPIC PROCEDURE TODAY: Refer to the procedure report that was given to you for any specific questions about what was found during the examination.  If the procedure report does not answer your questions, please call your gastroenterologist to clarify. ° °YOU SHOULD EXPECT: Some feelings of bloating in the abdomen. Passage of more gas than usual.  Walking can help get rid of the air that was put into your GI tract during the procedure and reduce the bloating. If you had a lower endoscopy (such as a colonoscopy or flexible sigmoidoscopy) you may notice spotting of blood in your stool or on the toilet paper.  ° °DIET: Your first meal following the procedure should be a light meal and then it is ok to progress to your normal diet.  A half-sandwich or bowl of soup is an example of a good first meal.  Heavy or fried foods are harder to digest and may make you feel nasueas or bloated.  Drink plenty of fluids but you should avoid alcoholic beverages for 24 hours. ° °ACTIVITY: Your care partner should take you home directly after the procedure.  You should plan to take it easy, moving slowly for the rest of the day.  You can resume normal activity the day after the procedure however you should NOT DRIVE or use heavy machinery for 24 hours (because of the sedation medicines used during the test).   ° °SYMPTOMS TO REPORT IMMEDIATELY  °A gastroenterologist can be reached at any hour.  Please call your doctor's office for any of the following symptoms: ° °· Following lower endoscopy (colonoscopy, flexible sigmoidoscopy) ° Excessive amounts of blood in the stool ° Significant tenderness, worsening of abdominal pains ° Swelling of the abdomen that is new, acute ° Fever of 100° or higher °· Following upper endoscopy (EGD, EUS, ERCP) ° Vomiting of blood or coffee ground material ° New, significant abdominal pain ° New, significant chest pain or pain under the shoulder blades ° Painful or persistently difficult  swallowing ° New shortness of breath ° Black, tarry-looking stools ° °FOLLOW UP: °If any biopsies were taken you will be contacted by phone or by letter within the next 1-3 weeks.  Call your gastroenterologist if you have not heard about the biopsies in 3 weeks.  °Please also call your gastroenterologist's office with any specific questions about appointments or follow up tests. ° °Conscious Sedation, Adult, Care After °Refer to this sheet in the next few weeks. These instructions provide you with information on caring for yourself after your procedure. Your health care provider may also give you more specific instructions. Your treatment has been planned according to current medical practices, but problems sometimes occur. Call your health care provider if you have any problems or questions after your procedure. °WHAT TO EXPECT AFTER THE PROCEDURE  °After your procedure: °· You may feel sleepy, clumsy, and have poor balance for several hours. °· Vomiting may occur if you eat too soon after the procedure. °HOME CARE INSTRUCTIONS °· Do not participate in any activities where you could become injured for at least 24 hours. Do not: °¨ Drive. °¨ Swim. °¨ Ride a bicycle. °¨ Operate heavy machinery. °¨ Cook. °¨ Use power tools. °¨ Climb ladders. °¨ Work from a high place. °· Do not make important decisions or sign legal documents until you are improved. °· If you vomit, drink water, juice, or soup when you can drink without vomiting. Make sure you have little or no nausea before eating   solid foods. °· Only take over-the-counter or prescription medicines for pain, discomfort, or fever as directed by your health care provider. °· Make sure you and your family fully understand everything about the medicines given to you, including what side effects may occur. °· You should not drink alcohol, take sleeping pills, or take medicines that cause drowsiness for at least 24 hours. °· If you smoke, do not smoke without  supervision. °· If you are feeling better, you may resume normal activities 24 hours after you were sedated. °· Keep all appointments with your health care provider. °SEEK MEDICAL CARE IF: °· Your skin is pale or bluish in color. °· You continue to feel nauseous or vomit. °· Your pain is getting worse and is not helped by medicine. °· You have bleeding or swelling. °· You are still sleepy or feeling clumsy after 24 hours. °SEEK IMMEDIATE MEDICAL CARE IF: °· You develop a rash. °· You have difficulty breathing. °· You develop any type of allergic problem. °· You have a fever. °MAKE SURE YOU: °· Understand these instructions. °· Will watch your condition. °· Will get help right away if you are not doing well or get worse. °Document Released: 02/05/2013 Document Reviewed: 02/05/2013 °ExitCare® Patient Information ©2015 [employer]. This information is not intended to replace advice given to you by your health care provider. Make sure you discuss any questions you have with your health care provider. ° °

## 2014-08-06 NOTE — H&P (Signed)
  HPI: This is a very pleasant woman referred by Dr. Fuller Plan for EUS after he discovered pelvic mass during colonoscoyp and then confirmed on CT    Past Medical History  Diagnosis Date  . ALLERGIC RHINITIS 09/12/2007  . BACK PAIN 06/19/2008  . Cramp of limb 08/11/2009  . Long term (current) use of anticoagulants 11/29/2010  . FREQUENCY, URINARY 10/07/2009  . GERD 12/05/2006  . HEMORRHOIDS 11/17/2006  . HIP PAIN, RIGHT, CHRONIC 08/11/2009  . HX, PERSONAL, TUBERCULOSIS 11/17/2006  . HYPERLIPIDEMIA 09/12/2007  . HYPERTENSION 11/17/2006  . INSOMNIA-SLEEP DISORDER-UNSPEC 06/19/2008  . LEG PAIN, RIGHT 06/19/2008  . MASS, SUPERFICIAL 09/12/2007  . MENOPAUSAL DISORDER 09/20/2009  . OVERACTIVE BLADDER 03/04/2010  . PULMONARY EMBOLISM, HX OF 11/17/2006  . RECTAL BLEEDING 10/07/2007  . SCIATICA, RIGHT 09/12/2007  . SKIN LESION 07/20/2009  . TMJ SYNDROME 11/17/2006  . Iron deficiency anemia 12/07/2011  . Diverticulosis   . Hyperlipidemia 09/12/2007    Qualifier: Diagnosis of  By: Jenny Reichmann MD, Hunt Oris   . Arthritis   . Colon polyps   . DEEP VENOUS THROMBOPHLEBITIS, LEG, RIGHT 03/04/2010    right leg  . Pneumonia     Past Surgical History  Procedure Laterality Date  . Cholecystectomy    . Abdominal hysterectomy    . Ventral hernia repair      multiple ventral hernia repair/mesh  . Tumor removed off of right shoulder Right   . S/p right hip replaced min invasive hip surgury [facility] oct 2011 Right 2011  . Hemorroid surgery  removed at dr Silvio Pate office    x 2    No current facility-administered medications for this encounter.    Allergies as of 07/30/2014  . (No Known Allergies)    Family History  Problem Relation Age of Onset  . Colon cancer Father   . Heart disease Mother   . Heart disease Sister   . Diabetes Sister     History   Social History  . Marital Status: Married    Spouse Name: N/A  . Number of Children: 2  . Years of Education: N/A   Occupational History  . retired    Social  History Main Topics  . Smoking status: Former Smoker -- 0.00 packs/day    Types: Cigarettes    Quit date: 06/09/1977  . Smokeless tobacco: Never Used  . Alcohol Use: 0.0 oz/week    0 Standard drinks or equivalent per week     Comment: rare  . Drug Use: No     Comment: past marijuana  . Sexual Activity: Not on file   Other Topics Concern  . 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 Constitutional: generally well-appearing Psychiatric: alert and oriented x3 Abdomen: soft, nontender, nondistended, no obvious ascites, no peritoneal signs, normal bowel sounds     Assessment and plan: 69 y.o. female with pelvic mass  For lower EUS today

## 2014-08-06 NOTE — Anesthesia Preprocedure Evaluation (Deleted)
Anesthesia Evaluation  Patient identified by MRN, date of birth, ID band Patient awake    Reviewed: Allergy & Precautions, H&P , NPO status , Patient's Chart, lab work & pertinent test results, reviewed documented beta blocker date and time   Airway Mallampati: II  TM Distance: >3 FB Neck ROM: full    Dental no notable dental hx.    Pulmonary shortness of breath and with exertion, former smoker, PE TMJ syndrome breath sounds clear to auscultation  Pulmonary exam normal       Cardiovascular hypertension, Pt. on medications + DOE Rhythm:regular Rate:Normal     Neuro/Psych TIAnegative psych ROS   GI/Hepatic negative GI ROS, Neg liver ROS, GERD-  Medicated and Controlled,  Endo/Other  negative endocrine ROS  Renal/GU negative Renal ROS  negative genitourinary   Musculoskeletal   Abdominal (+) + obese,   Peds  Hematology negative hematology ROS (+)   Anesthesia Other Findings   Reproductive/Obstetrics negative OB ROS                           Anesthesia Physical Anesthesia Plan  ASA: III  Anesthesia Plan: MAC   Post-op Pain Management:    Induction:   Airway Management Planned:   Additional Equipment:   Intra-op Plan:   Post-operative Plan:   Informed Consent: I have reviewed the patients History and Physical, chart, labs and discussed the procedure including the risks, benefits and alternatives for the proposed anesthesia with the patient or authorized representative who has indicated his/her understanding and acceptance.   Dental Advisory Given  Plan Discussed with: CRNA and Surgeon  Anesthesia Plan Comments:         Anesthesia Quick Evaluation

## 2014-08-07 ENCOUNTER — Encounter (HOSPITAL_COMMUNITY): Payer: Self-pay | Admitting: Gastroenterology

## 2014-08-12 ENCOUNTER — Other Ambulatory Visit: Payer: Self-pay | Admitting: Surgery

## 2014-08-12 DIAGNOSIS — K5909 Other constipation: Secondary | ICD-10-CM | POA: Diagnosis not present

## 2014-08-12 DIAGNOSIS — R1900 Intra-abdominal and pelvic swelling, mass and lump, unspecified site: Secondary | ICD-10-CM

## 2014-08-13 ENCOUNTER — Ambulatory Visit: Payer: Medicare Other | Admitting: Internal Medicine

## 2014-08-17 ENCOUNTER — Telehealth: Payer: Self-pay | Admitting: Oncology

## 2014-08-17 NOTE — Telephone Encounter (Signed)
new patient appt-s/w patient and gave np appt for 04/26 @ 1:30 w/Dr. Benay Spice

## 2014-08-25 ENCOUNTER — Other Ambulatory Visit: Payer: Self-pay | Admitting: *Deleted

## 2014-08-25 ENCOUNTER — Other Ambulatory Visit: Payer: Self-pay | Admitting: Oncology

## 2014-08-25 ENCOUNTER — Telehealth: Payer: Self-pay | Admitting: Oncology

## 2014-08-25 ENCOUNTER — Encounter: Payer: Self-pay | Admitting: Oncology

## 2014-08-25 ENCOUNTER — Ambulatory Visit: Payer: Commercial Managed Care - HMO

## 2014-08-25 ENCOUNTER — Ambulatory Visit: Admission: RE | Admit: 2014-08-25 | Payer: Commercial Managed Care - HMO | Source: Ambulatory Visit

## 2014-08-25 ENCOUNTER — Ambulatory Visit (HOSPITAL_BASED_OUTPATIENT_CLINIC_OR_DEPARTMENT_OTHER): Payer: Commercial Managed Care - HMO | Admitting: Oncology

## 2014-08-25 VITALS — BP 153/79 | HR 72 | Temp 98.0°F | Resp 18 | Ht 65.0 in | Wt 210.2 lb

## 2014-08-25 DIAGNOSIS — D481 Neoplasm of uncertain behavior of connective and other soft tissue: Secondary | ICD-10-CM | POA: Diagnosis not present

## 2014-08-25 DIAGNOSIS — C49A4 Gastrointestinal stromal tumor of large intestine: Secondary | ICD-10-CM

## 2014-08-25 MED ORDER — IMATINIB MESYLATE 400 MG PO TABS: 400.0000 mg | ORAL_TABLET | Freq: Every day | ORAL | Status: DC

## 2014-08-25 NOTE — Telephone Encounter (Signed)
Gave avs & calendar for April/May. °

## 2014-08-25 NOTE — Progress Notes (Signed)
Faxed gleevec pa form to [employer]

## 2014-08-25 NOTE — [facility] Oncology Navigator Note (Signed)
Met with patient and her sister, Vaughan Basta during new patient visit. Explained the role of the GI Nurse Navigator and provided New Patient Packet with information on: 1.  GIST & Gleevec 2. Support groups 3. Advanced Directives 4. Fall Safety Plan Answered questions, reviewed current treatment plan using TEACH back and provided emotional support. Provided copy of current treatment plan. Reviewed process for ordering the [place] and location of [facility].  Merceda Elks, RN, BSN GI Oncology [place]

## 2014-08-25 NOTE — Progress Notes (Signed)
Brianna Walker Consult   Referring MD: Sunya Humbarger 69 y.o.  04-03-1946    Reason for Referral: Gastrointestinal stromal tumor   HPI: [name] was referred to Dr. Fuller Plan for evaluation of rectal bleeding and "flattening "of stools. She was taken to a colonoscopy on 07/21/2014. 4 sessile polyps were found in the sigmoid, descending, and transverse colon. The polyps were removed. A half circumferential firm submucosal smooth mass was found in the anterior wall of the rectum extending to the anal verge. Multiple biopsies were performed. The pathology revealed tubular adenomas involving the colon polyp biopsies. The biopsy from the rectum revealed prolapse change and no malignancy. She was referred to Dr. Ardis Hughs and underwent an endoscopic ultrasound procedure 08/06/2014. The rectal lumen was extrinsically compressed. A large hypoechoic heterogenous mass was noted to directly abut the distal rectal wall. The mass measured at least 7 cm. The mass was biopsied with transrectal FNA. The cytology ([id number]) revealed a gastrointestinal stromal tumor.  A CT of the abdomen and pelvis on 07/22/2014 revealed a normal liver. A soft tissue mass involving the rectum and anus. The mass measured 8.2 x 9.3 cm. No perirectal adenopathy. No omental or peritoneal disease. Status post ventral abdominal wall hernia repair. Enlargement of an anterior pelvic wall collection measuring 9 x 12 cm compared to 7.7 x 4.8 cm on the 2007 CT.  She was referred to Dr. Johney Maine. Her case was presented at the GI tumor conference last week. Dr. Johney Maine noted a smooth mass between the posterior vaginal wall and rectum. No erosion into the vaginal or rectal mucosa. Extensive pelvic surgery would be required in an attempt to remove the mass with the potential for a permanent colostomy. Neoadjuvant Gleevec is recommended by the multidisciplinary tumor board.   Past Medical History  Diagnosis Date   . ALLERGIC RHINITIS 09/12/2007  . BACK PAIN 06/19/2008  . Cramp of limb 08/11/2009  . Long term (current) use of anticoagulants 11/29/2010  . FREQUENCY, URINARY 10/07/2009  . GERD 12/05/2006  . HEMORRHOIDS 11/17/2006  . HIP PAIN, RIGHT, CHRONIC 08/11/2009  . HX, PERSONAL, TUBERCULOSIS 11/17/2006  . HYPERLIPIDEMIA 09/12/2007  . HYPERTENSION 11/17/2006  . INSOMNIA-SLEEP DISORDER-UNSPEC 06/19/2008  . LEG PAIN, RIGHT 06/19/2008  . MASS, SUPERFICIAL 09/12/2007  . MENOPAUSAL DISORDER 09/20/2009  . OVERACTIVE BLADDER 03/04/2010  . PULMONARY EMBOLISM, HX OF 11/17/2006  . RECTAL BLEEDING 10/07/2007  . SCIATICA, RIGHT 09/12/2007  . SKIN LESION 07/20/2009  . TMJ SYNDROME 11/17/2006  . Iron deficiency anemia 12/07/2011  . Diverticulosis   . Hyperlipidemia 09/12/2007    Qualifier: Diagnosis of  By: Jenny Reichmann MD, Hunt Oris   . Arthritis   . Colon polyps   . DEEP VENOUS THROMBOPHLEBITIS, LEG, RIGHT 03/04/2010    right leg  . Pneumonia   . Rectal/vaginal mass-gastrointestinal stromal tumor  06/2014    .  G3, P2, 1 miscarriage   .  TIA with transient 10 aphasia and the 1990s   .  "Tuberculosis "at age 89  Past Surgical History  Procedure Laterality Date  . Cholecystectomy    . Abdominal hysterectomy    . Ventral hernia repair      multiple ventral hernia repair/mesh  . Tumor removed off of right shoulder Right   . S/p right hip replaced min invasive hip surgury [facility] oct 2011 Right 2011  . Hemorroid surgery  removed at dr Silvio Pate office    x 2  . Eus N/A 08/06/2014  Procedure: LOWER ENDOSCOPIC ULTRASOUND (EUS);  Surgeon: Milus Banister, MD;  Location: [facility];  Service: Endoscopy;  Laterality: N/A;    Medications: Reviewed  Allergies: No Known Allergies  Family history: Her father died of colon cancer at age 10, a paternal first cousin had ovarian cancer in her 25s, a paternal uncle died of a brain tumor in his 13s. No other family history of cancer.  Social History:   She lives with her sister and  [place]. She is retired from Therapist, art. She does not use tobacco or alcohol. No transfusion history. No risk factor for HIV or hepatitis.  History  Alcohol Use  . 0.0 oz/week  . 0 Standard drinks or equivalent per week    Comment: rare    History  Smoking status  . Former Smoker -- 0.00 packs/day  . Types: Cigarettes  . Quit date: 06/09/1977  Smokeless tobacco  . Never Used      ROS:   Positives include: Constipation-improved after taking fiber, intermittent rectal bleeding, "flat" bowel movements, urinary frequency and nocturia, discomfort at the rectum  A complete ROS was otherwise negative.  Physical Exam:  Blood pressure 153/79, pulse 72, temperature 98 F (36.7 C), temperature source Oral, resp. rate 18, height 5\' 5"  (1.651 m), weight 210 lb 3.2 oz (95.346 kg), SpO2 100 %.  HEENT: Oropharynx without visible mass, neck without mass Lungs: Clear bilaterally Cardiac: Regular rate and rhythm Abdomen: No hepatosplenomegaly, no apparent ascites, firm mass at the abdominal wall inferior to the umbilicus Rectal: Smooth firm mucosal mass at the anterior rectum and posterior vagina.  Vascular: No leg edema Lymph nodes: No cervical, supraclavicular, axillary, or inguinal nodes Neurologic: Alert and oriented, the motor exam appears grossly intact in the upper and lower extremities Skin: No rash Musculoskeletal: No spine tenderness   LAB:  CBC  Lab Results  Component Value Date   WBC 8.1 07/28/2014   HGB 12.4 07/28/2014   HCT 37.0 07/28/2014   MCV 78.6 07/28/2014   PLT 348.0 07/28/2014   NEUTROABS 5.9 07/28/2014      Imaging:  CT images of the abdomen/pelvis 07/22/2014-reviewed   Assessment/Plan:         1.Gastrointestinal stromal tumor of the rectum  Status post an endoscopic ultrasound on 08/06/2014 confirming a mass abutting the distal rectal wall with an FNA biopsy confirming a gastrointestinal stromal tumor  2. Remote history of pulmonary  embolism-maintained on apixaban  3. Multiple colon polyps noted on the colonoscopy 07/21/2014 with the pathology revealing tubular adenomas    Disposition:   [name] has been diagnosed with a large gastrointestinal stromal tumor that appears to be arising from the distal rectal wall. She is symptomatic with mild pelvic discomfort and flattening of stools. She reports intermittent rectal bleeding that may be related to the tumor.  There is no clinical or x-ray evidence of distant metastatic disease. Her case was presented at the GI tumor conference last week. The consensus recommendation is to proceed with neoadjuvant therapy and an attempt to shrink the tumor prior to surgery. Hopefully a pelvic exoneration and permanent colostomy and be avoided if the tumor responds.  I recommend Gleevec therapy. We reviewed the potential toxicities associated with Gleevec including the chance for nausea, alopecia, rash, diarrhea, edema, hematologic toxicity, and cardiac toxicity. She will attend a chemotherapy teaching class. She agrees to proceed.  The plan is to administer Gleevec daily beginning on 08/28/2014. She will return for an office and lab visit on 09/11/2014.  We will plan for a restaging CT or MRI of the pelvis after a few months of Gleevec.  Approximate 50 minutes were spent with the Walker today. The majority of the time was used for counseling and coordination of care.  [place], [place] 08/25/2014, 5:13 PM

## 2014-08-25 NOTE — Progress Notes (Signed)
Checked in new pt with no financial concerns prior to seeing the dr.  Pt has 2 insurances so financial assistance may not be needed but she has my card for any billing questions or concerns. °

## 2014-08-26 ENCOUNTER — Encounter: Payer: Self-pay | Admitting: Oncology

## 2014-08-26 ENCOUNTER — Telehealth: Payer: Self-pay

## 2014-08-26 NOTE — Telephone Encounter (Signed)
S/w pt that gleevec has been approved and will be at [facility] outpatient pharmacy.

## 2014-08-26 NOTE — Progress Notes (Signed)
Per [employer] gleevec approved 08/26/14-08/25/16. I will forward to medical records.

## 2014-08-27 ENCOUNTER — Other Ambulatory Visit: Payer: Commercial Managed Care - HMO

## 2014-08-27 ENCOUNTER — Telehealth: Payer: Self-pay | Admitting: *Deleted

## 2014-08-27 ENCOUNTER — Other Ambulatory Visit (HOSPITAL_BASED_OUTPATIENT_CLINIC_OR_DEPARTMENT_OTHER): Payer: Commercial Managed Care - HMO

## 2014-08-27 ENCOUNTER — Encounter: Payer: Self-pay | Admitting: *Deleted

## 2014-08-27 DIAGNOSIS — C49A4 Gastrointestinal stromal tumor of large intestine: Secondary | ICD-10-CM

## 2014-08-27 DIAGNOSIS — D481 Neoplasm of uncertain behavior of connective and other soft tissue: Secondary | ICD-10-CM

## 2014-08-27 LAB — CBC WITH DIFFERENTIAL/PLATELET
BASO%: 0.6 % (ref 0.0–2.0)
BASOS ABS: 0 10*3/uL (ref 0.0–0.1)
EOS ABS: 0.1 10*3/uL (ref 0.0–0.5)
EOS%: 1.9 % (ref 0.0–7.0)
HCT: 38.3 % (ref 34.8–46.6)
HGB: 12.5 g/dL (ref 11.6–15.9)
LYMPH%: 22.6 % (ref 14.0–49.7)
MCH: 26.5 pg (ref 25.1–34.0)
MCHC: 32.5 g/dL (ref 31.5–36.0)
MCV: 81.4 fL (ref 79.5–101.0)
MONO#: 0.3 10*3/uL (ref 0.1–0.9)
MONO%: 4.6 % (ref 0.0–14.0)
NEUT%: 70.3 % (ref 38.4–76.8)
NEUTROS ABS: 5.2 10*3/uL (ref 1.5–6.5)
Platelets: 354 10*3/uL (ref 145–400)
RBC: 4.7 10*6/uL (ref 3.70–5.45)
RDW: 16.9 % — ABNORMAL HIGH (ref 11.2–14.5)
WBC: 7.4 10*3/uL (ref 3.9–10.3)
lymph#: 1.7 10*3/uL (ref 0.9–3.3)

## 2014-08-27 LAB — COMPREHENSIVE METABOLIC PANEL (CC13)
ALBUMIN: 3.5 g/dL (ref 3.5–5.0)
ALT: 8 U/L (ref 0–55)
AST: 12 U/L (ref 5–34)
Alkaline Phosphatase: 118 U/L (ref 40–150)
Anion Gap: 14 mEq/L — ABNORMAL HIGH (ref 3–11)
BUN: 10.8 mg/dL (ref 7.0–26.0)
CALCIUM: 9.1 mg/dL (ref 8.4–10.4)
CHLORIDE: 110 meq/L — AB (ref 98–109)
CO2: 19 mEq/L — ABNORMAL LOW (ref 22–29)
Creatinine: 0.9 mg/dL (ref 0.6–1.1)
EGFR: 78 mL/min/{1.73_m2} — ABNORMAL LOW (ref >90)
GLUCOSE: 114 mg/dL (ref 70–140)
POTASSIUM: 3.8 meq/L (ref 3.5–5.1)
Sodium: 143 mEq/L (ref 136–145)
Total Bilirubin: 0.9 mg/dL (ref 0.20–1.20)
Total Protein: 8 g/dL (ref 6.4–8.3)

## 2014-08-27 NOTE — Telephone Encounter (Signed)
Confirmed with pharmacy that [facility] is ready for pick up and co pay for 1 month supply is $1.20. Made education nurse aware and she will inform patient at her 12:30 chemo class today. Instructed her to start tomorrow.

## 2014-08-28 ENCOUNTER — Other Ambulatory Visit: Payer: Self-pay | Admitting: Surgery

## 2014-08-28 NOTE — H&P (Signed)
Brianna Walker 08/12/2014 9:57 AM Location: [facility] Patient #: [id number] DOB: 10/18/1945 Married / Language: English / Race: Black or African American Female  History of Present Illness Adin [name] MD; 08/12/2014 12:43 PM) Patient words: rectum nodule.  The patient is a 69 year old female who presents with an abdominal mass. Patient bile of our gastroenterology of concern of enlarging pelvic mass with change in caliber of stools. Probable extraluminal gastrointestinal stromal tumor. Pleasant obese female. History of numerous abdominal surgeries. Has had ventral hernia repairs done in an open fashion by Dr. Michiel Sites whom has since retired. Has chronic fluid collection in periumbilical region. Patient notes that her stools became more narrow over the past year or so. More constipated. Occasional rectal bleeding. Recall some history of hemorrhoids in the past. Has bowel movement about twice a week. No problems with incontinence to flatus or stool. No incontinence to urine. She had abdominal hysterectomy when she was in her forties for prolapse. Does not recall any diagnosis of cancer. She can walk a half hour without difficulty. She does not smoke. She is on chronic anticoagulation for recurrent pulmonary embolism. Been at least 5 years. She underwent colonoscopy and found to have an obvious bulging mass in her rectum. CT scan showed large bulky mass as well. Moderately large fluid collection on anterior abdominal wall but not not solid. Biopsy of pelvic mass consistent with gastrointestinal stromal tumor. Surgical consultation requested.   Other Problems Elbert Ewings, CMA; 08/12/2014 9:57 AM) Back Pain Cerebrovascular Accident Gastroesophageal Reflux Disease Hemorrhoids High blood pressure Sickle cell disease  Past Surgical History Elbert Ewings, CMA; 08/12/2014 9:57 AM) Colon Polyp Removal - Colonoscopy Gallbladder Surgery - Laparoscopic Hip Surgery  Right. Hysterectomy (not due to cancer) - Partial Shoulder Surgery Right.  Diagnostic Studies History Elbert Ewings, [place]; 08/12/2014 9:57 AM) Colonoscopy within last year Mammogram within last year Pap Smear >5 years ago  Allergies Elbert Ewings, CMA; 08/12/2014 9:57 AM) No Known Drug Allergies04/13/2016  Medication History Elbert Ewings, CMA; 08/12/2014 9:58 AM) TraMADol HCl (50MG  Tablet, Oral) Active. Benazepril HCl (40MG  Tablet, Oral) Active. Eliquis (5MG  Tablet, Oral) Active. Estradiol (0.025MG /24HR Patch TW, Transdermal) Active. MoviPrep (100GM For Solution, Oral) Active. Omeprazole (20MG  Capsule DR, Oral) Active. Pravastatin Sodium (20MG  Tablet, Oral) Active. Medications Reconciled  Social History Elbert Ewings, [place]; 08/12/2014 9:57 AM) Alcohol use Occasional alcohol use. Caffeine use Carbonated beverages, Coffee, Tea. No drug use Tobacco use Former smoker.  Family History Elbert Ewings, [place]; 08/12/2014 9:57 AM) Colon Cancer Father. Colon Polyps Father. Diabetes Mellitus Sister. Heart Disease Mother, Sister. Heart disease in female family member before age 55 Malignant Neoplasm Of Pancreas Father.  Pregnancy / Birth History Elbert Ewings, CMA; 08/12/2014 9:57 AM) Age at menarche 89 years. Age of menopause <47 Gravida 2 Irregular periods Maternal age 87-20 Para 2  Review of Systems Elbert Ewings CMA; 08/12/2014 9:57 AM) General Present- Fatigue, Night Sweats and Weight Gain. Not Present- Appetite Loss, Chills, Fever and Weight Loss. Skin Present- Dryness. Not Present- Change in Wart/Mole, Hives, Jaundice, New Lesions, Non-Healing Wounds, Rash and Ulcer. HEENT Present- Seasonal Allergies, Sinus Pain, Visual Disturbances and Wears glasses/contact lenses. Not Present- Earache, Hearing Loss, Hoarseness, Nose Bleed, Oral Ulcers, Ringing in the Ears, Sore Throat and Yellow Eyes. Cardiovascular Present- Leg Cramps. Not Present- Chest Pain, Difficulty  Breathing Lying Down, Palpitations, Rapid Heart Rate, Shortness of Breath and Swelling of Extremities. Gastrointestinal Present- Bloating, Bloody Stool, Change in Bowel Habits, Constipation, Hemorrhoids and Rectal Pain. Not Present- Abdominal  Pain, Chronic diarrhea, Difficulty Swallowing, Excessive gas, Gets full quickly at meals, Indigestion, Nausea and Vomiting. Female Genitourinary Present- Nocturia. Not Present- Frequency, Painful Urination, Pelvic Pain and Urgency. Musculoskeletal Present- Back Pain and Joint Stiffness. Not Present- Joint Pain, Muscle Pain, Muscle Weakness and Swelling of Extremities. Neurological Present- Decreased Memory. Not Present- Fainting, Headaches, Numbness, Seizures, Tingling, Tremor, Trouble walking and Weakness. Endocrine Present- Heat Intolerance and Hot flashes. Not Present- Cold Intolerance, Excessive Hunger, Hair Changes and New Diabetes. Hematology Present- Easy Bruising. Not Present- Excessive bleeding, Gland problems, HIV and Persistent Infections.   Vitals Elbert Ewings CMA; 08/12/2014 9:59 AM) 08/12/2014 9:58 AM Weight: 209 lb Height: 63in Body Surface Area: 2.05 m Body Mass Index: 37.02 kg/m Temp.: 98.35F(Oral)  Pulse: 89 (Regular)  Resp.: 18 (Unlabored)  BP: 132/76 (Sitting, Left Arm, Standard)    Physical Exam Adin [name] MD; 08/12/2014 11:00 AM) General Mental Status-Alert. General Appearance-Not in acute distress, Not Sickly. Orientation-Oriented X3. Hydration-Well hydrated. Voice-Normal.  Integumentary Global Assessment Upon inspection and palpation of skin surfaces of the - Axillae: non-tender, no inflammation or ulceration, no drainage. and Distribution of scalp and body hair is normal. General Characteristics Temperature - normal warmth is noted.  Head and Neck Head-normocephalic, atraumatic with no lesions or palpable masses. Face Global Assessment - atraumatic, no absence of  expression. Neck Global Assessment - no abnormal movements, no bruit auscultated on the right, no bruit auscultated on the left, no decreased range of motion, non-tender. Trachea-midline. Thyroid Gland Characteristics - non-tender.  Eye Eyeball - Left-Extraocular movements intact, No Nystagmus. Eyeball - Right-Extraocular movements intact, No Nystagmus. Cornea - Left-No Hazy. Cornea - Right-No Hazy. Sclera/Conjunctiva - Left-No scleral icterus, No Discharge. Sclera/Conjunctiva - Right-No scleral icterus, No Discharge. Pupil - Left-Direct reaction to light normal. Pupil - Right-Direct reaction to light normal.  ENMT Ears Pinna - Left - no drainage observed, no generalized tenderness observed. Right - no drainage observed, no generalized tenderness observed. Nose and Sinuses External Inspection of the Nose - no destructive lesion observed. Inspection of the nares - Left - quiet respiration. Right - quiet respiration. Mouth and Throat Lips - Upper Lip - no fissures observed, no pallor noted. Lower Lip - no fissures observed, no pallor noted. Nasopharynx - no discharge present. Oral Cavity/Oropharynx - Tongue - no dryness observed. Oral Mucosa - no cyanosis observed. Hypopharynx - no evidence of airway distress observed.  Chest and Lung Exam Inspection Movements - Normal and Symmetrical. Accessory muscles - No use of accessory muscles in breathing. Palpation Palpation of the chest reveals - Non-tender. Auscultation Breath sounds - Normal and Clear.  Cardiovascular Auscultation Rhythm - Regular. Murmurs & Other Heart Sounds - Auscultation of the heart reveals - No Murmurs and No Systolic Clicks.  Abdomen Inspection Inspection of the abdomen reveals - No Visible peristalsis and No Abnormal pulsations. Umbilicus - No Bleeding, No Urine drainage. Palpation/Percussion Palpation and Percussion of the abdomen reveal - Soft, Non Tender, No Rebound tenderness, No  Rigidity (guarding) and No Cutaneous hyperesthesia. Note: 10x10cm SQ spherical mass periumbilical   Female Genitourinary Sexual Maturity Tanner 5 - Adult hair pattern. Note: Smooth but bulky 8 cm mass between posterior vaginal wall and rectum. Comes down to but not into the sphincters. Sensitive but not fluctuant. Significant bulging into the vagina and rectum but not completely obstructing. No erosion into the vagina or rectal mucosa. Normal sphincter tone. Posterior rectum smooth and not involved. Spherical. No vaginal bleeding nor discharge   Peripheral Vascular Upper Extremity Inspection -  Left - No Cyanotic nailbeds, Not Ischemic. Right - No Cyanotic nailbeds, Not Ischemic.  Neurologic Neurologic evaluation reveals -normal attention span and ability to concentrate, able to name objects and repeat phrases. Appropriate fund of knowledge , normal sensation and normal coordination. Mental Status Affect - not angry, not paranoid. Cranial Nerves-Normal Bilaterally. Gait-Normal.  Neuropsychiatric Mental status exam performed with findings of-able to articulate well with normal speech/language, rate, volume and coherence, thought content normal with ability to perform basic computations and apply abstract reasoning and no evidence of hallucinations, delusions, obsessions or homicidal/suicidal ideation.  Musculoskeletal Global Assessment Spine, Ribs and Pelvis - no instability, subluxation or laxity. Right Upper Extremity - no instability, subluxation or laxity.  Lymphatic Head & Neck  General Head & Neck Lymphatics: Bilateral - Description - No Localized lymphadenopathy. Axillary  General Axillary Region: Bilateral - Description - No Localized lymphadenopathy. Femoral & Inguinal  Generalized Femoral & Inguinal Lymphatics: Left - Description - No Localized lymphadenopathy. Right - Description - No Localized lymphadenopathy.    Assessment & Plan Adin [name] MD;  08/12/2014 12:44 PM) GASTROINTESTINAL STROMAL TUMOR (GIST) OF LARGE INTESTINE (238.1) Impression: Large bulky tumor in the rectovaginal septum consistent with gastrointestinal stromal tumor. Most likely rectal wall etiology.  At some point this will require resection.  Would like to get MRI of the pelvis to better define anatomy and see if there is vaginal involvement. If so, may ask gynecologic oncology to be involved.  We'll discuss with medical oncology to see if [place] in a neoadjuvant fashion can help shrink the tumor allow better resectability or less likelihood of permanent colostomy. If not an option may proceed with surgery first. Robotic/open approach.  She does have any large anterior abdominal wall cicatrix fluke sections consistent with chronic hematoma and fluid/seroma in hernia sac. Discuss with Dr. Abigail Miyamoto with radiology. They seems to be a separate issue. While the seroma/hematoma was [place] 2007 and has gotten slightly larger, there is no pelvic mass then. Her uterus resection pathology I cannot find given it being decades ago but she assures me it was not for cancer and was for prolapse issues.  We'll discuss with my partners. Are discussed with Dr. Marcello Moores with colorectal surgery. Perhaps discuss with gynecological oncology. We'll discuss with medical oncology and placed on tumor board as well. We'll follow up with the patient next week after we get some more information.  Addendum:  Current Plans  Discussed GI tumor board.  Discuss with medical oncology.  Discussed with surgical oncological and colorectal partners.  Plan would be to try and shrink GIST tumor with preoperative Gleevec in the neoadjuvant setting.  Once tumor has reached a point of maximal reduction/shrinkage, then plan robotically assisted resection of pelvic mass most likely involving very low anterior resection and possible partial vaginectomy.  I am concerned that anastomosis at that time not a good idea  unless it comes out very cleanly.  We will see.  Dr. Benay Spice planning 6 weeks of [place] chemotherapy.  Then we plan MRI to restage.  Continue Gleevec if there is persistent shrinkage.  If no more shrinkage, proceed with surgery.  Patient agrees.  Instructions:  Have a large bulky mass in your lower pelvis between your vagina and rectum. Biopsy consistent with gastrointestinal stromal tumor = GIST. You will benefit from surgical resection for this. We'll discuss at our gastrointestinal tumor board. May need involve gynecological oncology. We'll likely need a few more tests. May benefit from [facility] chemotherapy as well.  If you're not heard from me in a week, please call back after we have had a chance to discuss in a multidisciplinary fashion.  CHRONIC CONSTIPATION (564.00  K59.09) Current Plans Pt Education - [facility] Good Bowel Health (Dejan Angert) Pt Education - [facility] Pelvic Floor Exercises (Kegels) and Dysfunction HCI ([place]ardt Dada)  Adin [name], M.D., F.A.C.S. Gastrointestinal and Minimally Invasive Surgery [facility] [address], [place], [place] [address] [phone] Main / Paging

## 2014-08-31 ENCOUNTER — Telehealth: Payer: Self-pay | Admitting: Internal Medicine

## 2014-08-31 ENCOUNTER — Other Ambulatory Visit: Payer: Medicare Other

## 2014-08-31 NOTE — Telephone Encounter (Signed)
Brianna Walker from Dr. Johney Maine office faxed a Medical Clearance on April 20 and she will be re faxing it again today. Please call her to be sure you received it and ask for Brianna Walker so they can page her overhead.

## 2014-09-01 NOTE — Telephone Encounter (Signed)
Do not currently have it, will check around for it.

## 2014-09-01 NOTE — Telephone Encounter (Signed)
Patient has called in regards to this as well.  Patient can be reached at [phone]

## 2014-09-03 ENCOUNTER — Ambulatory Visit: Payer: Medicare Other | Admitting: Endocrinology

## 2014-09-04 ENCOUNTER — Ambulatory Visit (INDEPENDENT_AMBULATORY_CARE_PROVIDER_SITE_OTHER): Payer: Commercial Managed Care - HMO | Admitting: Internal Medicine

## 2014-09-04 ENCOUNTER — Encounter: Payer: Self-pay | Admitting: Internal Medicine

## 2014-09-04 ENCOUNTER — Telehealth: Payer: Self-pay

## 2014-09-04 VITALS — BP 120/84 | HR 83 | Temp 98.7°F | Resp 18 | Ht 65.0 in | Wt 205.1 lb

## 2014-09-04 DIAGNOSIS — H524 Presbyopia: Secondary | ICD-10-CM | POA: Diagnosis not present

## 2014-09-04 DIAGNOSIS — Z01818 Encounter for other preprocedural examination: Secondary | ICD-10-CM | POA: Diagnosis not present

## 2014-09-04 DIAGNOSIS — H5202 Hypermetropia, left eye: Secondary | ICD-10-CM | POA: Diagnosis not present

## 2014-09-04 DIAGNOSIS — Z0100 Encounter for examination of eyes and vision without abnormal findings: Secondary | ICD-10-CM | POA: Diagnosis not present

## 2014-09-04 DIAGNOSIS — Z0000 Encounter for general adult medical examination without abnormal findings: Secondary | ICD-10-CM | POA: Diagnosis not present

## 2014-09-04 DIAGNOSIS — H5210 Myopia, unspecified eye: Secondary | ICD-10-CM | POA: Diagnosis not present

## 2014-09-04 DIAGNOSIS — H52209 Unspecified astigmatism, unspecified eye: Secondary | ICD-10-CM | POA: Diagnosis not present

## 2014-09-04 DIAGNOSIS — H5203 Hypermetropia, bilateral: Secondary | ICD-10-CM | POA: Diagnosis not present

## 2014-09-04 MED ORDER — APIXABAN 5 MG PO TABS: 5.0000 mg | ORAL_TABLET | Freq: Two times a day (BID) | ORAL | Status: DC

## 2014-09-04 MED ORDER — BENAZEPRIL HCL 40 MG PO TABS: 40.0000 mg | ORAL_TABLET | Freq: Every evening | ORAL | Status: DC

## 2014-09-04 MED ORDER — PRAVASTATIN SODIUM 20 MG PO TABS: 20.0000 mg | ORAL_TABLET | Freq: Every day | ORAL | Status: DC

## 2014-09-04 MED ORDER — OMEPRAZOLE 20 MG PO CPDR: 20.0000 mg | DELAYED_RELEASE_CAPSULE | Freq: Every evening | ORAL | Status: DC

## 2014-09-04 MED ORDER — FERROUS SULFATE 325 (65 FE) MG PO TABS: 325.0000 mg | ORAL_TABLET | Freq: Every day | ORAL | Status: DC

## 2014-09-04 NOTE — Assessment & Plan Note (Signed)

## 2014-09-04 NOTE — Patient Instructions (Addendum)
OK for surgury as planned; a note will be sent regarding this  You will need to hold the Eliquis prior to surgury  Please continue all other medications as before, and refills have been done if requested.  Please have the pharmacy call with any other refills you may need.  Please continue your efforts at being more active, low cholesterol diet, and weight control.  You are otherwise up to date with prevention measures today.  Please keep your appointments with your specialists as you may have planned  No further lab work needed today  Please return in 1 year for your yearly visit, or sooner if needed, with Lab testing done 3-5 days before

## 2014-09-04 NOTE — Progress Notes (Signed)
Subjective:    Patient ID: Brianna Walker, female    DOB: [birth year], 69 y.o.   MRN: [id number]  HPI  Here for wellness and surgical clearance for prob large GIST tumor anorectal area, now on gleevec to reduce size, probable surgury soon.   Overall doing ok;  Pt denies Chest pain, worsening SOB, DOE, wheezing, orthopnea, PND, worsening LE edema, palpitations, dizziness or syncope.  Pt denies neurological change such as new headache, facial or extremity weakness.  Pt denies polydipsia, polyuria, or low sugar symptoms. Pt states overall good compliance with treatment and medications, good tolerability, and has been trying to follow appropriate diet.  Pt denies worsening depressive symptoms, suicidal ideation or panic. No fever, night sweats, wt loss, loss of appetite, or other constitutional symptoms.  Pt states good ability with ADL's, has low fall risk, home safety reviewed and adequate, no other significant changes in hearing or vision, and only occasionally active with exercise.  Last stress test 2014 - neg for ischemia Past Medical History  Diagnosis Date  . ALLERGIC RHINITIS 09/12/2007  . BACK PAIN 06/19/2008  . Cramp of limb 08/11/2009  . Long term (current) use of anticoagulants 11/29/2010  . FREQUENCY, URINARY 10/07/2009  . GERD 12/05/2006  . HEMORRHOIDS 11/17/2006  . HIP PAIN, RIGHT, CHRONIC 08/11/2009  . HX, PERSONAL, TUBERCULOSIS 11/17/2006  . HYPERLIPIDEMIA 09/12/2007  . HYPERTENSION 11/17/2006  . INSOMNIA-SLEEP DISORDER-UNSPEC 06/19/2008  . LEG PAIN, RIGHT 06/19/2008  . MASS, SUPERFICIAL 09/12/2007  . MENOPAUSAL DISORDER 09/20/2009  . OVERACTIVE BLADDER 03/04/2010  . PULMONARY EMBOLISM, HX OF 11/17/2006  . RECTAL BLEEDING 10/07/2007  . SCIATICA, RIGHT 09/12/2007  . SKIN LESION 07/20/2009  . TMJ SYNDROME 11/17/2006  . Iron deficiency anemia 12/07/2011  . Diverticulosis   . Hyperlipidemia 09/12/2007    Qualifier: Diagnosis of  By: Jenny Reichmann MD, Hunt Oris   . Arthritis   . Colon polyps   . DEEP VENOUS  THROMBOPHLEBITIS, LEG, RIGHT 03/04/2010    right leg  . Pneumonia   . Rectal mass 06/2014   Past Surgical History  Procedure Laterality Date  . Cholecystectomy    . Abdominal hysterectomy    . Ventral hernia repair      multiple ventral hernia repair/mesh  . Tumor removed off of right shoulder Right   . S/p right hip replaced min invasive hip surgury [facility] oct 2011 Right 2011  . Hemorroid surgery  removed at dr Silvio Pate office    x 2  . Eus N/A 08/06/2014    Procedure: LOWER ENDOSCOPIC ULTRASOUND (EUS);  Surgeon: Milus Banister, MD;  Location: [facility];  Service: Endoscopy;  Laterality: N/A;    reports that she quit smoking about 37 years ago. Her smoking use included Cigarettes. She smoked 0.00 packs per day. She has never used smokeless tobacco. She reports that she drinks alcohol. She reports that she does not use illicit drugs. family history includes Colon cancer in her father; Diabetes in her sister; Heart disease in her mother and sister. No Known Allergies Current Outpatient Prescriptions on File Prior to Visit  Medication Sig Dispense Refill  . apixaban (ELIQUIS) 5 MG TABS tablet Take 1 tablet (5 mg total) by mouth 2 (two) times daily. 60 tablet 11  . aspirin 81 MG tablet Take 81 mg by mouth daily.      . benazepril (LOTENSIN) 40 MG tablet Take 1 tablet (40 mg total) by mouth daily. (Patient taking differently: Take 40 mg by mouth every evening. ) 90  tablet 3  . estradiol (VIVELLE-DOT) 0.025 MG/24HR Place 1 patch onto the skin 2 (two) times a week. 8 patch 12  . ferrous sulfate 325 (65 FE) MG tablet Take 1 tablet (325 mg total) by mouth 2 (two) times daily. (Patient taking differently: Take 325 mg by mouth daily. ) 180 tablet 3  . imatinib (GLEEVEC) 400 MG tablet Take 1 tablet (400 mg total) by mouth daily. Take with meals and large glass of water.Caution:Chemotherapy. 30 tablet 2  . NON FORMULARY Fiber tablets; 2 tabs daily    . omeprazole (PRILOSEC) 20 MG capsule Take 1  capsule (20 mg total) by mouth daily. (Patient taking differently: Take 20 mg by mouth every evening. ) 90 capsule 3  . pravastatin (PRAVACHOL) 20 MG tablet Take 1 tablet (20 mg total) by mouth daily. 90 tablet 3  . traMADol (ULTRAM) 50 MG tablet Take 1 tablet (50 mg total) by mouth at bedtime as needed. 30 tablet 5   No current facility-administered medications on file prior to visit.   Review of Systems Constitutional: Negative for increased diaphoresis, other activity, appetite or siginficant weight change other than noted HENT: Negative for worsening hearing loss, ear pain, facial swelling, mouth sores and neck stiffness.   Eyes: Negative for other worsening pain, redness or visual disturbance.  Respiratory: Negative for shortness of breath and wheezing  Cardiovascular: Negative for chest pain and palpitations.  Gastrointestinal: Negative for diarrhea, blood in stool, abdominal distention or other pain Genitourinary: Negative for hematuria, flank pain or change in urine volume.  Musculoskeletal: Negative for myalgias or other joint complaints.  Skin: Negative for color change and wound or drainage.  Neurological: Negative for syncope and numbness. other than noted Hematological: Negative for adenopathy. or other swelling Psychiatric/Behavioral: Negative for hallucinations, SI, self-injury, decreased concentration or other worsening agitation.      Objective:   Physical Exam BP 120/84 mmHg  Pulse 83  Temp(Src) 98.7 F (37.1 C) (Oral)  Resp 18  Ht 5\' 5"  (1.651 m)  Wt 205 lb 1.9 oz (93.042 kg)  BMI 34.13 kg/m2  SpO2 97% VS noted,  Constitutional: Pt is oriented to person, place, and time. Appears well-developed and well-nourished, in no significant distress Head: Normocephalic and atraumatic.  Right Ear: External ear normal.  Left Ear: External ear normal.  Nose: Nose normal.  Mouth/Throat: Oropharynx is clear and moist.  Eyes: Conjunctivae and EOM are normal. Pupils are  equal, round, and reactive to light.  Neck: Normal range of motion. Neck supple. No JVD present. No tracheal deviation present or significant neck LA or mass Cardiovascular: Normal rate, regular rhythm, normal heart sounds and intact distal pulses.   Pulmonary/Chest: Effort normal and breath sounds without rales or wheezing  Abdominal: Soft. Bowel sounds are normal. NT. No HSM  Musculoskeletal: Normal range of motion. Exhibits no edema.  Lymphadenopathy:  Has no cervical adenopathy.  Neurological: Pt is alert and oriented to person, place, and time. Pt has normal reflexes. No cranial nerve deficit. Motor grossly intact Skin: Skin is warm and dry. No rash noted.  Psychiatric:  Has normal mood and affect. Behavior is normal.  Lab Results  Component Value Date   WBC 7.4 08/27/2014   HGB 12.5 08/27/2014   HCT 38.3 08/27/2014   PLT 354 08/27/2014   GLUCOSE 114 08/27/2014   CHOL 156 02/11/2014   TRIG 275.0* 02/11/2014   HDL 32.60* 02/11/2014   LDLDIRECT 86.1 02/11/2014   LDLCALC 81 05/28/2012   ALT 8 08/27/2014  AST 12 08/27/2014   NA 143 08/27/2014   K 3.8 08/27/2014   CL 108 02/11/2014   CREATININE 0.9 08/27/2014   BUN 10.8 08/27/2014   CO2 19* 08/27/2014   TSH 0.21* 02/11/2014   INR 2.6 07/18/2013      Assessment & Plan:

## 2014-09-04 NOTE — Assessment & Plan Note (Signed)
OK for surgury as planned

## 2014-09-04 NOTE — Telephone Encounter (Signed)
Medical Clearance was faxed to [facility]

## 2014-09-07 ENCOUNTER — Telehealth: Payer: Self-pay | Admitting: *Deleted

## 2014-09-07 NOTE — Telephone Encounter (Signed)
Call to follow up on how she tolerating her gleevec? She reports no adverse effect at this time. Feels things are going well. Confirms her appointment on Friday, but may need to call and change it. Instructed her to call and ask for scheduler if she does.

## 2014-09-10 ENCOUNTER — Ambulatory Visit: Payer: Commercial Managed Care - HMO | Admitting: Nurse Practitioner

## 2014-09-10 ENCOUNTER — Telehealth: Payer: Self-pay | Admitting: Oncology

## 2014-09-10 ENCOUNTER — Other Ambulatory Visit: Payer: Commercial Managed Care - HMO

## 2014-09-10 ENCOUNTER — Telehealth: Payer: Self-pay | Admitting: *Deleted

## 2014-09-10 NOTE — Telephone Encounter (Signed)
Confirmed appointment for 05/16.

## 2014-09-10 NOTE — Telephone Encounter (Signed)
Called to ask patient if she would be able to come in Monday 09/14/14 at 2:45 to see Ned Card instead of today 09/10/14.  Per Elby Showers. Marcello Moores, NP.  Patient stated that would be fine.  POF sent to schedulers.

## 2014-09-11 ENCOUNTER — Ambulatory Visit: Payer: Commercial Managed Care - HMO | Admitting: Nurse Practitioner

## 2014-09-11 ENCOUNTER — Other Ambulatory Visit: Payer: Commercial Managed Care - HMO

## 2014-09-14 ENCOUNTER — Other Ambulatory Visit (HOSPITAL_BASED_OUTPATIENT_CLINIC_OR_DEPARTMENT_OTHER): Payer: Commercial Managed Care - HMO

## 2014-09-14 ENCOUNTER — Telehealth: Payer: Self-pay | Admitting: Oncology

## 2014-09-14 ENCOUNTER — Other Ambulatory Visit: Payer: Self-pay | Admitting: Internal Medicine

## 2014-09-14 ENCOUNTER — Ambulatory Visit (HOSPITAL_BASED_OUTPATIENT_CLINIC_OR_DEPARTMENT_OTHER): Payer: Commercial Managed Care - HMO | Admitting: Nurse Practitioner

## 2014-09-14 VITALS — BP 156/85 | HR 79 | Temp 98.6°F | Resp 19 | Ht 65.0 in | Wt 214.5 lb

## 2014-09-14 DIAGNOSIS — Z86711 Personal history of pulmonary embolism: Secondary | ICD-10-CM | POA: Diagnosis not present

## 2014-09-14 DIAGNOSIS — C49A4 Gastrointestinal stromal tumor of large intestine: Secondary | ICD-10-CM

## 2014-09-14 DIAGNOSIS — C499 Malignant neoplasm of connective and soft tissue, unspecified: Secondary | ICD-10-CM

## 2014-09-14 LAB — CBC WITH DIFFERENTIAL/PLATELET
BASO%: 0.9 % (ref 0.0–2.0)
BASOS ABS: 0.1 10*3/uL (ref 0.0–0.1)
EOS ABS: 0.1 10*3/uL (ref 0.0–0.5)
EOS%: 2.1 % (ref 0.0–7.0)
HCT: 37.7 % (ref 34.8–46.6)
HEMOGLOBIN: 12.1 g/dL (ref 11.6–15.9)
LYMPH#: 1.5 10*3/uL (ref 0.9–3.3)
LYMPH%: 22.9 % (ref 14.0–49.7)
MCH: 26.4 pg (ref 25.1–34.0)
MCHC: 32 g/dL (ref 31.5–36.0)
MCV: 82.6 fL (ref 79.5–101.0)
MONO#: 0.2 10*3/uL (ref 0.1–0.9)
MONO%: 3.7 % (ref 0.0–14.0)
NEUT%: 70.4 % (ref 38.4–76.8)
NEUTROS ABS: 4.7 10*3/uL (ref 1.5–6.5)
Platelets: 319 10*3/uL (ref 145–400)
RBC: 4.57 10*6/uL (ref 3.70–5.45)
RDW: 16.9 % — AB (ref 11.2–14.5)
WBC: 6.7 10*3/uL (ref 3.9–10.3)

## 2014-09-14 LAB — COMPREHENSIVE METABOLIC PANEL (CC13)
ALBUMIN: 3.3 g/dL — AB (ref 3.5–5.0)
ALT: 10 U/L (ref 0–55)
AST: 16 U/L (ref 5–34)
Alkaline Phosphatase: 122 U/L (ref 40–150)
Anion Gap: 9 mEq/L (ref 3–11)
BILIRUBIN TOTAL: 0.81 mg/dL (ref 0.20–1.20)
BUN: 9.1 mg/dL (ref 7.0–26.0)
CO2: 24 mEq/L (ref 22–29)
Calcium: 8.7 mg/dL (ref 8.4–10.4)
Chloride: 109 mEq/L (ref 98–109)
Creatinine: 1 mg/dL (ref 0.6–1.1)
EGFR: 71 mL/min/{1.73_m2} — ABNORMAL LOW (ref >90)
GLUCOSE: 92 mg/dL (ref 70–140)
Potassium: 3.9 mEq/L (ref 3.5–5.1)
Sodium: 143 mEq/L (ref 136–145)
Total Protein: 7.6 g/dL (ref 6.4–8.3)

## 2014-09-14 MED ORDER — ESTRADIOL 0.025 MG/24HR TD PTTW: MEDICATED_PATCH | TRANSDERMAL | Status: DC

## 2014-09-14 NOTE — Progress Notes (Signed)
  [place] OFFICE PROGRESS NOTE   Diagnosis:  Gastrointestinal stromal tumor  INTERVAL HISTORY:   Ms. Basilio returns as scheduled. She began [place] on 09/04/2014. She denies nausea/vomiting. No diarrhea. No rash. No edema. She has occasional cramps involving the ankles and feet. Stools are more formed. No rectal pain or bleeding.  Objective:  Vital signs in last 24 hours:  Blood pressure 156/85, pulse 79, temperature 98.6 F (37 C), temperature source Oral, resp. rate 19, height 5\' 5"  (1.651 m), weight 214 lb 8 oz (97.297 kg), SpO2 100 %.    HEENT: No thrush or ulcers. Resp: Lungs clear bilaterally. Cardio: Regular rate and rhythm. GI: No organomegaly. Firm mass lower abdomen inferior to the transverse abdominal scar. Vascular: No leg edema. Calves soft and nontender. Skin: No rash.    Lab Results:  Lab Results  Component Value Date   WBC 6.7 09/14/2014   HGB 12.1 09/14/2014   HCT 37.7 09/14/2014   MCV 82.6 09/14/2014   PLT 319 09/14/2014   NEUTROABS 4.7 09/14/2014    Imaging:  No results found.  Medications: I have reviewed the patient's current medications.  Assessment/Plan: 1. Gastrointestinal stromal tumor of the rectum  Status post an endoscopic ultrasound on 08/06/2014 confirming a mass abutting the distal rectal wall with an FNA biopsy confirming a gastrointestinal stromal tumor  Initiation of Gleevec 09/04/2014 2. Remote history of pulmonary embolism-maintained on apixaban 3. Multiple colon polyps noted on the colonoscopy 07/21/2014 with the pathology revealing tubular adenomas   Disposition: Ms. Wolaver appears to be tolerating the [place] well. Plan to continue the same. She will return for labs and a follow-up visit in 3 weeks. She will contact the office in the interim with any problems.  Plan reviewed with Dr. Benay Spice.    Ned Card ANP/GNP-BC   09/14/2014  2:50 PM

## 2014-09-14 NOTE — Telephone Encounter (Signed)
Pt called in and wanted to make sure that her estradiol (VIVELLE-DOT) 0.025 MG/24HR [[id number] was sent in to her [place].  Pt is completely out.    Best number [phone]

## 2014-09-14 NOTE — Telephone Encounter (Signed)
Sent rx to [facility].Marland KitchenAndee Poles

## 2014-09-14 NOTE — Telephone Encounter (Signed)
Gave and prnted appt sched and avs for pt for June

## 2014-09-22 ENCOUNTER — Telehealth: Payer: Self-pay

## 2014-09-22 NOTE — Telephone Encounter (Signed)
PA for estradiol sent to plan via cover my meds.

## 2014-09-29 ENCOUNTER — Other Ambulatory Visit: Payer: Self-pay

## 2014-10-01 MED ORDER — TRAMADOL HCL 50 MG PO TABS: 50.0000 mg | ORAL_TABLET | Freq: Every evening | ORAL | Status: DC | PRN

## 2014-10-01 NOTE — Telephone Encounter (Signed)
Rx called in, pt informed

## 2014-10-05 ENCOUNTER — Ambulatory Visit (HOSPITAL_BASED_OUTPATIENT_CLINIC_OR_DEPARTMENT_OTHER): Payer: Commercial Managed Care - HMO | Admitting: Nurse Practitioner

## 2014-10-05 ENCOUNTER — Other Ambulatory Visit (HOSPITAL_BASED_OUTPATIENT_CLINIC_OR_DEPARTMENT_OTHER): Payer: Commercial Managed Care - HMO

## 2014-10-05 ENCOUNTER — Telehealth: Payer: Self-pay | Admitting: Oncology

## 2014-10-05 VITALS — BP 143/77 | HR 73 | Temp 97.9°F | Resp 18 | Ht 65.0 in | Wt 213.8 lb

## 2014-10-05 DIAGNOSIS — C499 Malignant neoplasm of connective and soft tissue, unspecified: Secondary | ICD-10-CM

## 2014-10-05 DIAGNOSIS — C49A4 Gastrointestinal stromal tumor of large intestine: Secondary | ICD-10-CM

## 2014-10-05 LAB — COMPREHENSIVE METABOLIC PANEL (CC13)
ALT: 10 U/L (ref 0–55)
AST: 19 U/L (ref 5–34)
Albumin: 3.4 g/dL — ABNORMAL LOW (ref 3.5–5.0)
Alkaline Phosphatase: 126 U/L (ref 40–150)
Anion Gap: 7 mEq/L (ref 3–11)
BUN: 12.6 mg/dL (ref 7.0–26.0)
CALCIUM: 8.6 mg/dL (ref 8.4–10.4)
CO2: 24 mEq/L (ref 22–29)
Chloride: 110 mEq/L — ABNORMAL HIGH (ref 98–109)
Creatinine: 0.9 mg/dL (ref 0.6–1.1)
EGFR: 76 mL/min/{1.73_m2} — AB (ref >90)
Glucose: 87 mg/dl (ref 70–140)
POTASSIUM: 3.9 meq/L (ref 3.5–5.1)
SODIUM: 141 meq/L (ref 136–145)
TOTAL PROTEIN: 7.8 g/dL (ref 6.4–8.3)
Total Bilirubin: 0.82 mg/dL (ref 0.20–1.20)

## 2014-10-05 LAB — CBC WITH DIFFERENTIAL/PLATELET
BASO%: 1.2 % (ref 0.0–2.0)
Basophils Absolute: 0.1 10e3/uL (ref 0.0–0.1)
EOS%: 3.2 % (ref 0.0–7.0)
Eosinophils Absolute: 0.2 10e3/uL (ref 0.0–0.5)
HCT: 37 % (ref 34.8–46.6)
HGB: 12.2 g/dL (ref 11.6–15.9)
LYMPH%: 24.2 % (ref 14.0–49.7)
MCH: 27.3 pg (ref 25.1–34.0)
MCHC: 32.9 g/dL (ref 31.5–36.0)
MCV: 82.9 fL (ref 79.5–101.0)
MONO#: 0.4 10e3/uL (ref 0.1–0.9)
MONO%: 5.8 % (ref 0.0–14.0)
NEUT#: 4.5 10e3/uL (ref 1.5–6.5)
NEUT%: 65.6 % (ref 38.4–76.8)
Platelets: 320 10e3/uL (ref 145–400)
RBC: 4.46 10e6/uL (ref 3.70–5.45)
RDW: 17.2 % — ABNORMAL HIGH (ref 11.2–14.5)
WBC: 6.9 10e3/uL (ref 3.9–10.3)
lymph#: 1.7 10e3/uL (ref 0.9–3.3)

## 2014-10-05 NOTE — Progress Notes (Signed)
  [place] OFFICE PROGRESS NOTE   Diagnosis:  Gastrointestinal stromal tumor  INTERVAL HISTORY:   Brianna Walker returns as scheduled. She continues Gleevec. She feels the rectal tumor is smaller. No is no longer experiencing rectal bleeding or pain. No diarrhea. She has occasional nausea. No vomiting. She denies any periorbital or leg edema. No rash.  Objective:  Vital signs in last 24 hours:  Blood pressure 143/77, pulse 73, temperature 97.9 F (36.6 C), temperature source Oral, resp. rate 18, height 5\' 5"  (1.651 m), weight 213 lb 12.8 oz (96.979 kg), SpO2 100 %.    HEENT: No thrush or ulcers. Resp: Lungs clear bilaterally. Cardio: Regular rate and rhythm. GI: No organomegaly. Firm mass lower abdomen inferior to the transverse abdominal scar. Smooth firm mass at the anterior rectum. Vascular: No leg edema. Calves soft and nontender. Skin: No rash.    Lab Results:  Lab Results  Component Value Date   WBC 6.9 10/05/2014   HGB 12.2 10/05/2014   HCT 37.0 10/05/2014   MCV 82.9 10/05/2014   PLT 320 10/05/2014   NEUTROABS 4.5 10/05/2014    Imaging:  No results found.  Medications: I have reviewed the patient's current medications.  Assessment/Plan: 1. Gastrointestinal stromal tumor of the rectum  Status post an endoscopic ultrasound on 08/06/2014 confirming a mass abutting the distal rectal wall with an FNA biopsy confirming a gastrointestinal stromal tumor  Initiation of Gleevec 09/04/2014 2. Remote history of pulmonary embolism-maintained on apixaban 3. Multiple colon polyps noted on the colonoscopy 07/21/2014 with the pathology revealing tubular adenomas   Disposition: Brianna Walker appears stable. She is tolerating the [place] well. Plan to continue the same. She will return for a follow-up visit in 4 weeks. She will contact the office in the interim with any problems. The plan is to complete approximate 3 months of Gleevec prior to a restaging  evaluation.  Plan reviewed with Dr. Benay Spice.  Ned Card ANP/GNP-BC   10/05/2014  3:28 PM

## 2014-10-05 NOTE — Telephone Encounter (Signed)
Gave and printed appt shced and avs for pt for July

## 2014-10-16 NOTE — Telephone Encounter (Signed)
PA for estradiol approved.   LVM for pt to call back as soon as possible.  RE: PA approved.

## 2014-10-19 ENCOUNTER — Telehealth: Payer: Self-pay

## 2014-10-19 NOTE — Telephone Encounter (Addendum)
S/w pt. Her hands are not painful, not cracked, not bleeding. Instructed on mild soaps, lukewarm water and emollient cream such as eucerine, aquaphor, alpha keri, lubriderm. Pt was appreciative. Next appt 7/7.

## 2014-10-19 NOTE — Telephone Encounter (Signed)
Pt called stating hands are peeling. Asked for call back.

## 2014-11-05 ENCOUNTER — Ambulatory Visit (HOSPITAL_BASED_OUTPATIENT_CLINIC_OR_DEPARTMENT_OTHER): Payer: Commercial Managed Care - HMO | Admitting: Oncology

## 2014-11-05 ENCOUNTER — Other Ambulatory Visit (HOSPITAL_BASED_OUTPATIENT_CLINIC_OR_DEPARTMENT_OTHER): Payer: Commercial Managed Care - HMO

## 2014-11-05 ENCOUNTER — Telehealth: Payer: Self-pay | Admitting: Oncology

## 2014-11-05 VITALS — BP 157/72 | HR 71 | Temp 98.3°F | Resp 18 | Ht 65.0 in | Wt 217.8 lb

## 2014-11-05 DIAGNOSIS — C499 Malignant neoplasm of connective and soft tissue, unspecified: Secondary | ICD-10-CM

## 2014-11-05 DIAGNOSIS — C49A4 Gastrointestinal stromal tumor of large intestine: Secondary | ICD-10-CM

## 2014-11-05 LAB — COMPREHENSIVE METABOLIC PANEL (CC13)
ALT: 12 U/L (ref 0–55)
AST: 16 U/L (ref 5–34)
Albumin: 3.3 g/dL — ABNORMAL LOW (ref 3.5–5.0)
Alkaline Phosphatase: 110 U/L (ref 40–150)
Anion Gap: 8 mEq/L (ref 3–11)
BILIRUBIN TOTAL: 0.67 mg/dL (ref 0.20–1.20)
BUN: 12.3 mg/dL (ref 7.0–26.0)
CO2: 21 meq/L — AB (ref 22–29)
CREATININE: 1 mg/dL (ref 0.6–1.1)
Calcium: 8.6 mg/dL (ref 8.4–10.4)
Chloride: 113 mEq/L — ABNORMAL HIGH (ref 98–109)
EGFR: 68 mL/min/{1.73_m2} — AB (ref >90)
GLUCOSE: 97 mg/dL (ref 70–140)
Potassium: 3.5 mEq/L (ref 3.5–5.1)
Sodium: 143 mEq/L (ref 136–145)
TOTAL PROTEIN: 7.3 g/dL (ref 6.4–8.3)

## 2014-11-05 LAB — CBC WITH DIFFERENTIAL/PLATELET
BASO%: 0.8 % (ref 0.0–2.0)
Basophils Absolute: 0 10*3/uL (ref 0.0–0.1)
EOS%: 3.2 % (ref 0.0–7.0)
Eosinophils Absolute: 0.2 10*3/uL (ref 0.0–0.5)
HEMATOCRIT: 35 % (ref 34.8–46.6)
HEMOGLOBIN: 11.6 g/dL (ref 11.6–15.9)
LYMPH%: 22.8 % (ref 14.0–49.7)
MCH: 27.8 pg (ref 25.1–34.0)
MCHC: 33.3 g/dL (ref 31.5–36.0)
MCV: 83.7 fL (ref 79.5–101.0)
MONO#: 0.2 10*3/uL (ref 0.1–0.9)
MONO%: 4.6 % (ref 0.0–14.0)
NEUT#: 3.5 10*3/uL (ref 1.5–6.5)
NEUT%: 68.6 % (ref 38.4–76.8)
Platelets: 283 10*3/uL (ref 145–400)
RBC: 4.18 10*6/uL (ref 3.70–5.45)
RDW: 17.4 % — AB (ref 11.2–14.5)
WBC: 5.1 10*3/uL (ref 3.9–10.3)
lymph#: 1.2 10*3/uL (ref 0.9–3.3)

## 2014-11-05 NOTE — Telephone Encounter (Signed)
Pt confirmed labs/ov per 07/07 POF, gave pt AVS and Calendar.... KJ °

## 2014-11-05 NOTE — Progress Notes (Signed)
  [place] OFFICE PROGRESS NOTE   Diagnosis: Gastrointestinal stromal tumor  INTERVAL HISTORY:   Brianna Walker returns as scheduled. She feels well. No diarrhea or rash. Mild periorbital edema. The pelvic mass has decreased in size. No difficulty with bowel or bladder function. The pelvic tumor is no longer visible externally.  Objective:  Vital signs in last 24 hours:  Blood pressure 157/72, pulse 71, temperature 98.3 F (36.8 C), temperature source Oral, resp. rate 18, height 5\' 5"  (1.651 m), weight 217 lb 12.8 oz (98.793 kg), SpO2 100 %.    HEENT: No thrush or ulcers, mild periorbital edema Resp: Lungs clear bilaterally Cardio: Regular rate and rhythm GI: No hepatomegaly, masslike fullness in the low mid abdomen, no visible mass at the anal verge. Vascular: No leg edema  Skin: No rash     Lab Results:  Lab Results  Component Value Date   WBC 5.1 11/05/2014   HGB 11.6 11/05/2014   HCT 35.0 11/05/2014   MCV 83.7 11/05/2014   PLT 283 11/05/2014   NEUTROABS 3.5 11/05/2014     Medications: I have reviewed the patient's current medications.  Assessment/Plan: 1. Gastrointestinal stromal tumor of the rectum  Status post an endoscopic ultrasound on 08/06/2014 confirming a mass abutting the distal rectal wall with an FNA biopsy confirming a gastrointestinal stromal tumor  Initiation of Gleevec 09/04/2014 2. Remote history of pulmonary embolism-maintained on apixaban 3. Multiple colon polyps noted on the colonoscopy 07/21/2014 with the pathology revealing tubular adenomas   Disposition:  Ms. Manocchio has been maintained on [place] for the past 2 months. She is tolerating the [place] well and there has been clinical improvement. She will continue Gleevec. Ms. Carder will return for an office visit and repeat exam in one month. We will then contact Dr. Johney Maine regarding a restaging CT or MRI.  Betsy Coder, MD  11/05/2014  10:58 AM   `

## 2014-11-20 ENCOUNTER — Telehealth: Payer: Self-pay | Admitting: *Deleted

## 2014-11-20 NOTE — Telephone Encounter (Signed)
PT.'S HANDS HAVE PEELED AND HEALED. HER FEET ARE PEELING. PT. HAS "BLOTCHES" ON BOTH CALVES. PT. HAS A SMALL ROUGH "SANDY" AREA ON HER STOMACH BUT NO ITCHING. NO PROBLEMS JUST AN UPDATE.

## 2014-11-23 ENCOUNTER — Other Ambulatory Visit: Payer: Self-pay | Admitting: Oncology

## 2014-11-24 NOTE — Telephone Encounter (Signed)
Patient called reporting her Pharmacy has called [facility] to refill Gleevec and would like to know status.  Informed her this was sent eRx around 1:00 pm today.  She will call pharmacy to see if ready for pick up.

## 2014-12-01 ENCOUNTER — Other Ambulatory Visit: Payer: Commercial Managed Care - HMO

## 2014-12-01 ENCOUNTER — Telehealth: Payer: Self-pay | Admitting: *Deleted

## 2014-12-01 ENCOUNTER — Ambulatory Visit: Payer: Commercial Managed Care - HMO | Admitting: Oncology

## 2014-12-01 NOTE — Telephone Encounter (Signed)
Called pt re: missed appointment today. She reports she called schedulers to cancel due to having cold symptoms. Requests to reschedule. Pt continues Gleevec, reports peeling in hands and feet. Denies pain. Pt states she has noticed light-colored "splotches on calves" and a small rash on abdomen.  Order sent to schedulers for appointment.

## 2014-12-02 ENCOUNTER — Telehealth: Payer: Self-pay | Admitting: Oncology

## 2014-12-02 ENCOUNTER — Telehealth: Payer: Self-pay | Admitting: *Deleted

## 2014-12-02 NOTE — Telephone Encounter (Signed)
Called pt with an opening for 8/5 at 1030 with Dr. Benay Spice. She agrees to this appointment. Pt will check in at 1000 for lab. Order to schedulers to change appointment.

## 2014-12-02 NOTE — Telephone Encounter (Signed)
vm full....mailed pt appt sched and letter °

## 2014-12-03 ENCOUNTER — Other Ambulatory Visit: Payer: Self-pay | Admitting: *Deleted

## 2014-12-03 ENCOUNTER — Telehealth: Payer: Self-pay | Admitting: *Deleted

## 2014-12-03 NOTE — Telephone Encounter (Signed)
Pt was mistakenly double-booked on APP's schedule. Called pt with new time, 0830 for lab 0900 with Dr. Benay Spice on 8/5.

## 2014-12-04 ENCOUNTER — Other Ambulatory Visit (HOSPITAL_BASED_OUTPATIENT_CLINIC_OR_DEPARTMENT_OTHER): Payer: Commercial Managed Care - HMO

## 2014-12-04 ENCOUNTER — Ambulatory Visit (HOSPITAL_BASED_OUTPATIENT_CLINIC_OR_DEPARTMENT_OTHER): Payer: Commercial Managed Care - HMO | Admitting: Oncology

## 2014-12-04 ENCOUNTER — Ambulatory Visit: Payer: Commercial Managed Care - HMO | Admitting: Nurse Practitioner

## 2014-12-04 ENCOUNTER — Other Ambulatory Visit: Payer: Commercial Managed Care - HMO

## 2014-12-04 ENCOUNTER — Telehealth: Payer: Self-pay | Admitting: Oncology

## 2014-12-04 VITALS — BP 140/70 | HR 90 | Temp 98.7°F | Resp 18 | Ht 65.0 in | Wt 218.0 lb

## 2014-12-04 DIAGNOSIS — C49A4 Gastrointestinal stromal tumor of large intestine: Secondary | ICD-10-CM

## 2014-12-04 DIAGNOSIS — C499 Malignant neoplasm of connective and soft tissue, unspecified: Secondary | ICD-10-CM

## 2014-12-04 LAB — CBC WITH DIFFERENTIAL/PLATELET
BASO%: 0.3 % (ref 0.0–2.0)
BASOS ABS: 0 10*3/uL (ref 0.0–0.1)
EOS ABS: 0.2 10*3/uL (ref 0.0–0.5)
EOS%: 2.8 % (ref 0.0–7.0)
HCT: 35.3 % (ref 34.8–46.6)
HEMOGLOBIN: 11.8 g/dL (ref 11.6–15.9)
LYMPH%: 21 % (ref 14.0–49.7)
MCH: 28.6 pg (ref 25.1–34.0)
MCHC: 33.4 g/dL (ref 31.5–36.0)
MCV: 85.7 fL (ref 79.5–101.0)
MONO#: 0.3 10*3/uL (ref 0.1–0.9)
MONO%: 5 % (ref 0.0–14.0)
NEUT#: 4.4 10*3/uL (ref 1.5–6.5)
NEUT%: 70.9 % (ref 38.4–76.8)
PLATELETS: 279 10*3/uL (ref 145–400)
RBC: 4.12 10*6/uL (ref 3.70–5.45)
RDW: 16.3 % — ABNORMAL HIGH (ref 11.2–14.5)
WBC: 6.2 10*3/uL (ref 3.9–10.3)
lymph#: 1.3 10*3/uL (ref 0.9–3.3)

## 2014-12-04 LAB — COMPREHENSIVE METABOLIC PANEL (CC13)
ALT: 11 U/L (ref 0–55)
ANION GAP: 7 meq/L (ref 3–11)
AST: 14 U/L (ref 5–34)
Albumin: 3.4 g/dL — ABNORMAL LOW (ref 3.5–5.0)
Alkaline Phosphatase: 109 U/L (ref 40–150)
BILIRUBIN TOTAL: 0.69 mg/dL (ref 0.20–1.20)
BUN: 11.1 mg/dL (ref 7.0–26.0)
CHLORIDE: 112 meq/L — AB (ref 98–109)
CO2: 25 meq/L (ref 22–29)
Calcium: 8.6 mg/dL (ref 8.4–10.4)
Creatinine: 1.1 mg/dL (ref 0.6–1.1)
EGFR: 62 mL/min/{1.73_m2} — ABNORMAL LOW (ref >90)
GLUCOSE: 110 mg/dL (ref 70–140)
Potassium: 3.5 mEq/L (ref 3.5–5.1)
Sodium: 144 mEq/L (ref 136–145)
Total Protein: 7.4 g/dL (ref 6.4–8.3)

## 2014-12-04 NOTE — Telephone Encounter (Signed)
Pt confirmed labs/ov per 08/04 POF, gave pt avs and calendar.... KJ °

## 2014-12-04 NOTE — Progress Notes (Signed)
  [place] OFFICE PROGRESS NOTE   Diagnosis: Gastrointestinal stromal tumor  INTERVAL HISTORY:   Mr. Douds returns as scheduled. She continues Gleevec. She had a "cold" earlier this week. Her symptoms have improved. She denies diarrhea. No difficulty with bowel or bladder function. The rectal mass is smaller. No bleeding. She has noted hypopigmented areas at the lower legs.  Objective:  Vital signs in last 24 hours:  Blood pressure 140/70, pulse 90, temperature 98.7 F (37.1 C), temperature source Oral, resp. rate 18, height 5\' 5"  (1.651 m), weight 218 lb (98.884 kg), SpO2 100 %.    HEENT: No thrush or ulcers Lymphatics: No inguinal nodes Resp: Lungs clear bilaterally Cardio: Regular rate and rhythm GI: No hepatomegaly, firm mass in the mid lower abdomen Vascular: No leg edema Rectal: Smooth mass at the anterior rectum and rectovaginal septum. The mass is significantly smaller compared to when I saw her in April. Skin: Few less someone similar areas of hypopigmentation at the lower legs     Lab Results:  Lab Results  Component Value Date   WBC 6.2 12/04/2014   HGB 11.8 12/04/2014   HCT 35.3 12/04/2014   MCV 85.7 12/04/2014   PLT 279 12/04/2014   NEUTROABS 4.4 12/04/2014    Lab Results  Component Value Date   NA 143 11/05/2014    No results found for: CEA  Imaging:  No results found.  Medications: I have reviewed the patient's current medications.  Assessment/Plan: 1. Gastrointestinal stromal tumor of the rectum  Status post an endoscopic ultrasound on 08/06/2014 confirming a mass abutting the distal rectal wall with an FNA biopsy confirming a gastrointestinal stromal tumor  Initiation of Gleevec 09/04/2014 2. Remote history of pulmonary embolism-maintained on apixaban 3. Multiple colon polyps noted on the colonoscopy 07/21/2014 with the pathology revealing tubular adenomas   Disposition:  Ms. Heiden has been on [place] for the  past 3 months. She is tolerating Gleevec well and the colon mass appears significantly smaller. She will continue Gleevec. She will be referred for an MRI of the pelvis within the next few weeks and then see Dr. Johney Maine.  Ms. Ezzell will return for an office and lab visit in one month.  Betsy Coder, MD  12/04/2014  9:12 AM

## 2014-12-07 ENCOUNTER — Telehealth: Payer: Self-pay | Admitting: *Deleted

## 2014-12-07 ENCOUNTER — Telehealth: Payer: Self-pay | Admitting: Oncology

## 2014-12-07 NOTE — Telephone Encounter (Signed)
Oncology Nurse Navigator Documentation  Oncology Nurse Navigator Flowsheets 12/07/2014  Navigator Encounter Type Telephone;3 month  Treatment Phase Treatment-Gleevec  Barriers/Navigation Needs No barriers at this time-co pay is managable.  Interventions Instructed her to call office if she does not hear from radiology regarding MRI by 8/10. Needs to be done before she sees Dr. Johney Maine on 12/16/14.  Time Spent with Patient 5

## 2014-12-07 NOTE — Telephone Encounter (Signed)
Faxed pt medical records to [facility]. °

## 2014-12-10 ENCOUNTER — Other Ambulatory Visit: Payer: Commercial Managed Care - HMO

## 2014-12-10 ENCOUNTER — Ambulatory Visit: Payer: Commercial Managed Care - HMO | Admitting: Nurse Practitioner

## 2014-12-14 ENCOUNTER — Ambulatory Visit
Admission: RE | Admit: 2014-12-14 | Discharge: 2014-12-14 | Disposition: A | Discharge status: Home / Self Care | Payer: Commercial Managed Care - HMO | Source: Ambulatory Visit | Attending: Oncology | Admitting: Oncology

## 2014-12-14 DIAGNOSIS — C189 Malignant neoplasm of colon, unspecified: Secondary | ICD-10-CM | POA: Diagnosis not present

## 2014-12-14 DIAGNOSIS — C49A4 Gastrointestinal stromal tumor of large intestine: Secondary | ICD-10-CM

## 2014-12-14 MED ORDER — GADOBENATE DIMEGLUMINE 529 MG/ML IV SOLN
20.0000 mL | Freq: Once | INTRAVENOUS | Status: AC | PRN
  Administered 2014-12-14: 20 mL via INTRAVENOUS

## 2014-12-16 ENCOUNTER — Other Ambulatory Visit: Payer: Commercial Managed Care - HMO

## 2014-12-16 ENCOUNTER — Telehealth: Payer: Self-pay | Admitting: *Deleted

## 2014-12-16 DIAGNOSIS — T888XXS Other specified complications of surgical and medical care, not elsewhere classified, sequela: Secondary | ICD-10-CM | POA: Diagnosis not present

## 2014-12-16 DIAGNOSIS — K5909 Other constipation: Secondary | ICD-10-CM | POA: Diagnosis not present

## 2014-12-16 DIAGNOSIS — T792XXS Traumatic secondary and recurrent hemorrhage and seroma, sequela: Secondary | ICD-10-CM | POA: Diagnosis not present

## 2014-12-16 NOTE — Telephone Encounter (Signed)
Result note of MRI given to pt per Dr. Benay Spice, pt voices understanding and appreciated call. Will call us with any other questions or concerns

## 2014-12-16 NOTE — Telephone Encounter (Signed)
-----   Message from Ladell Pier, MD sent at 12/15/2014  7:52 PM EDT ----- Please call patient, pelvic mass is smaller

## 2014-12-29 ENCOUNTER — Ambulatory Visit: Payer: Commercial Managed Care - HMO | Admitting: Nurse Practitioner

## 2014-12-29 ENCOUNTER — Other Ambulatory Visit: Payer: Commercial Managed Care - HMO

## 2014-12-29 ENCOUNTER — Telehealth: Payer: Self-pay | Admitting: Oncology

## 2014-12-29 NOTE — Telephone Encounter (Signed)
s.w. pt and r/s appt...done....pt ok and aware

## 2015-01-05 ENCOUNTER — Ambulatory Visit (HOSPITAL_BASED_OUTPATIENT_CLINIC_OR_DEPARTMENT_OTHER): Payer: Commercial Managed Care - HMO | Admitting: Nurse Practitioner

## 2015-01-05 ENCOUNTER — Other Ambulatory Visit (HOSPITAL_BASED_OUTPATIENT_CLINIC_OR_DEPARTMENT_OTHER): Payer: Commercial Managed Care - HMO

## 2015-01-05 ENCOUNTER — Telehealth: Payer: Self-pay | Admitting: Oncology

## 2015-01-05 VITALS — BP 135/70 | HR 75 | Temp 98.2°F | Resp 18 | Ht 65.0 in | Wt 221.1 lb

## 2015-01-05 DIAGNOSIS — Z86018 Personal history of other benign neoplasm: Secondary | ICD-10-CM | POA: Diagnosis not present

## 2015-01-05 DIAGNOSIS — Z86711 Personal history of pulmonary embolism: Secondary | ICD-10-CM | POA: Diagnosis not present

## 2015-01-05 DIAGNOSIS — C499 Malignant neoplasm of connective and soft tissue, unspecified: Secondary | ICD-10-CM

## 2015-01-05 DIAGNOSIS — C49A4 Gastrointestinal stromal tumor of large intestine: Secondary | ICD-10-CM

## 2015-01-05 LAB — COMPREHENSIVE METABOLIC PANEL (CC13)
ALK PHOS: 116 U/L (ref 40–150)
ALT: 11 U/L (ref 0–55)
ANION GAP: 6 meq/L (ref 3–11)
AST: 18 U/L (ref 5–34)
Albumin: 3.5 g/dL (ref 3.5–5.0)
BILIRUBIN TOTAL: 0.7 mg/dL (ref 0.20–1.20)
BUN: 10.6 mg/dL (ref 7.0–26.0)
CO2: 26 meq/L (ref 22–29)
Calcium: 8.9 mg/dL (ref 8.4–10.4)
Chloride: 111 mEq/L — ABNORMAL HIGH (ref 98–109)
Creatinine: 1 mg/dL (ref 0.6–1.1)
EGFR: 71 mL/min/{1.73_m2} — ABNORMAL LOW (ref >90)
GLUCOSE: 80 mg/dL (ref 70–140)
Potassium: 3.7 mEq/L (ref 3.5–5.1)
SODIUM: 144 meq/L (ref 136–145)
TOTAL PROTEIN: 7.6 g/dL (ref 6.4–8.3)

## 2015-01-05 LAB — CBC WITH DIFFERENTIAL/PLATELET
BASO%: 0.8 % (ref 0.0–2.0)
Basophils Absolute: 0 10*3/uL (ref 0.0–0.1)
EOS ABS: 0.1 10*3/uL (ref 0.0–0.5)
EOS%: 2 % (ref 0.0–7.0)
HCT: 36.6 % (ref 34.8–46.6)
HEMOGLOBIN: 12.1 g/dL (ref 11.6–15.9)
LYMPH#: 1.4 10*3/uL (ref 0.9–3.3)
LYMPH%: 22.5 % (ref 14.0–49.7)
MCH: 29 pg (ref 25.1–34.0)
MCHC: 33.1 g/dL (ref 31.5–36.0)
MCV: 87.7 fL (ref 79.5–101.0)
MONO#: 0.3 10*3/uL (ref 0.1–0.9)
MONO%: 5 % (ref 0.0–14.0)
NEUT%: 69.7 % (ref 38.4–76.8)
NEUTROS ABS: 4.4 10*3/uL (ref 1.5–6.5)
PLATELETS: 317 10*3/uL (ref 145–400)
RBC: 4.18 10*6/uL (ref 3.70–5.45)
RDW: 15.4 % — AB (ref 11.2–14.5)
WBC: 6.4 10*3/uL (ref 3.9–10.3)

## 2015-01-05 NOTE — Progress Notes (Signed)
  [place] OFFICE PROGRESS NOTE   Diagnosis:  Gastrointestinal stromal tumor  INTERVAL HISTORY:   Ms. Freeberg returns as scheduled. She continues Gleevec. She has occasional mild nausea. No diarrhea. No rash. She denies any leg edema. She reports a recent "cold". She continues to have a slight cough.  Objective:  Vital signs in last 24 hours:  Blood pressure 135/70, pulse 75, temperature 98.2 F (36.8 C), temperature source Oral, resp. rate 18, height 5\' 5"  (1.651 m), weight 221 lb 1.6 oz (100.29 kg), SpO2 98 %.    HEENT: No thrush or ulcers. Resp: Lungs clear bilaterally. Cardio: Regular rate and rhythm. GI: No hepatomegaly. Firm mass mid to lower abdomen. Vascular: No leg edema.  Skin: No rash.    Lab Results:  Lab Results  Component Value Date   WBC 6.4 01/05/2015   HGB 12.1 01/05/2015   HCT 36.6 01/05/2015   MCV 87.7 01/05/2015   PLT 317 01/05/2015   NEUTROABS 4.4 01/05/2015    Imaging:  No results found.  Medications: I have reviewed the patient's current medications.  Assessment/Plan: 1. Gastrointestinal stromal tumor of the rectum  Status post an endoscopic ultrasound on 08/06/2014 confirming a mass abutting the distal rectal wall with an FNA biopsy confirming a gastrointestinal stromal tumor  Initiation of Gleevec 09/04/2014  MRI pelvis 12/15/2014 with interval decreased size of the large anorectal mass with central necrosis.  Continuation of Gleevec. 2. Remote history of pulmonary embolism-maintained on apixaban 3. Multiple colon polyps noted on the colonoscopy 07/21/2014 with the pathology revealing tubular adenomas   Disposition: Ms. Magnussen appears stable. The recent MRI showed improvement. She will continue Gleevec. We will see her back in one month.    Ned Card ANP/GNP-BC   01/05/2015  3:18 PM

## 2015-01-05 NOTE — Telephone Encounter (Signed)
Gave patient avs report and appointments for October  °

## 2015-01-14 ENCOUNTER — Other Ambulatory Visit: Payer: Self-pay | Admitting: Internal Medicine

## 2015-02-04 ENCOUNTER — Ambulatory Visit (HOSPITAL_BASED_OUTPATIENT_CLINIC_OR_DEPARTMENT_OTHER): Payer: Commercial Managed Care - HMO | Admitting: Nurse Practitioner

## 2015-02-04 ENCOUNTER — Telehealth: Payer: Self-pay | Admitting: Oncology

## 2015-02-04 ENCOUNTER — Other Ambulatory Visit (HOSPITAL_BASED_OUTPATIENT_CLINIC_OR_DEPARTMENT_OTHER): Payer: Commercial Managed Care - HMO

## 2015-02-04 VITALS — BP 163/91 | HR 82 | Temp 97.9°F | Resp 18 | Wt 223.1 lb

## 2015-02-04 DIAGNOSIS — Z86018 Personal history of other benign neoplasm: Secondary | ICD-10-CM | POA: Diagnosis not present

## 2015-02-04 DIAGNOSIS — C49A4 Gastrointestinal stromal tumor of large intestine: Secondary | ICD-10-CM

## 2015-02-04 DIAGNOSIS — Z86711 Personal history of pulmonary embolism: Secondary | ICD-10-CM

## 2015-02-04 DIAGNOSIS — R110 Nausea: Secondary | ICD-10-CM

## 2015-02-04 LAB — CBC WITH DIFFERENTIAL/PLATELET
BASO%: 0.5 % (ref 0.0–2.0)
Basophils Absolute: 0 10*3/uL (ref 0.0–0.1)
EOS ABS: 0.1 10*3/uL (ref 0.0–0.5)
EOS%: 1.9 % (ref 0.0–7.0)
HEMATOCRIT: 35.4 % (ref 34.8–46.6)
HEMOGLOBIN: 11.6 g/dL (ref 11.6–15.9)
LYMPH#: 1.3 10*3/uL (ref 0.9–3.3)
LYMPH%: 23.6 % (ref 14.0–49.7)
MCH: 29.1 pg (ref 25.1–34.0)
MCHC: 32.8 g/dL (ref 31.5–36.0)
MCV: 88.9 fL (ref 79.5–101.0)
MONO#: 0.3 10*3/uL (ref 0.1–0.9)
MONO%: 5.1 % (ref 0.0–14.0)
NEUT%: 68.9 % (ref 38.4–76.8)
NEUTROS ABS: 3.9 10*3/uL (ref 1.5–6.5)
PLATELETS: 243 10*3/uL (ref 145–400)
RBC: 3.98 10*6/uL (ref 3.70–5.45)
RDW: 14.5 % (ref 11.2–14.5)
WBC: 5.7 10*3/uL (ref 3.9–10.3)

## 2015-02-04 LAB — COMPREHENSIVE METABOLIC PANEL (CC13)
ALBUMIN: 3.4 g/dL — AB (ref 3.5–5.0)
ALK PHOS: 111 U/L (ref 40–150)
ALT: 10 U/L (ref 0–55)
ANION GAP: 7 meq/L (ref 3–11)
AST: 17 U/L (ref 5–34)
BILIRUBIN TOTAL: 0.65 mg/dL (ref 0.20–1.20)
BUN: 10 mg/dL (ref 7.0–26.0)
CALCIUM: 8.6 mg/dL (ref 8.4–10.4)
CO2: 22 meq/L (ref 22–29)
CREATININE: 1 mg/dL (ref 0.6–1.1)
Chloride: 115 mEq/L — ABNORMAL HIGH (ref 98–109)
EGFR: 67 mL/min/{1.73_m2} — ABNORMAL LOW (ref >90)
Glucose: 88 mg/dl (ref 70–140)
Potassium: 3.7 mEq/L (ref 3.5–5.1)
Sodium: 144 mEq/L (ref 136–145)
TOTAL PROTEIN: 7.3 g/dL (ref 6.4–8.3)

## 2015-02-04 MED ORDER — PROCHLORPERAZINE MALEATE 5 MG PO TABS: 5.0000 mg | ORAL_TABLET | Freq: Four times a day (QID) | ORAL | Status: DC | PRN

## 2015-02-04 NOTE — Progress Notes (Signed)
  [facility] OFFICE PROGRESS NOTE   Diagnosis:  Gastrointestinal stromal tumor  INTERVAL HISTORY:   Brianna Walker returns as scheduled. She continues Gleevec. She has mild intermittent nausea. No vomiting. No mouth sores. No diarrhea. No skin rash. She periodically notes "cramps" in the hands and feet. She would like to see a dentist due to "crumbling" teeth.  Objective:  Vital signs in last 24 hours:  Blood pressure 163/91, pulse 82, temperature 97.9 F (36.6 C), temperature source Oral, resp. rate 18, weight 223 lb 2 oz (101.209 kg), SpO2 100 %.    HEENT: No thrush or ulcers. In general poor dentition. Resp: Lungs clear bilaterally. Cardio: Regular rate and rhythm. GI: Abdomen soft and nontender. No hepatomegaly. Firm mass mid to lower abdomen. Vascular: No leg edema. Skin: No rash.    Lab Results:  Lab Results  Component Value Date   WBC 5.7 02/04/2015   HGB 11.6 02/04/2015   HCT 35.4 02/04/2015   MCV 88.9 02/04/2015   PLT 243 02/04/2015   NEUTROABS 3.9 02/04/2015    Imaging:  No results found.  Medications: I have reviewed the patient's current medications.  Assessment/Plan: 1. Gastrointestinal stromal tumor of the rectum  Status post an endoscopic ultrasound on 08/06/2014 confirming a mass abutting the distal rectal wall with an FNA biopsy confirming a gastrointestinal stromal tumor  Initiation of Gleevec 09/04/2014  MRI pelvis 12/15/2014 with interval decreased size of the large anorectal mass with central necrosis.  Continuation of Gleevec. 2. Remote history of pulmonary embolism-maintained on apixaban 3. Multiple colon polyps noted on the colonoscopy 07/21/2014 with the pathology revealing tubular adenomas   Disposition: Brianna Walker appears stable. She will continue Gleevec. For the nausea a prescription was sent to her pharmacy for Compazine 5 mg every 6 hours as needed.   She has a follow-up appointment with Dr. Johney Maine in the near  future. We will see her in one month. She will contact the office in the interim with any problems.  Plan reviewed with Dr. Benay Spice.  Ned Card ANP/GNP-BC   02/04/2015  3:14 PM

## 2015-02-04 NOTE — Telephone Encounter (Signed)
lvm for pt regarding to [place] appt...mailed pt appt sched/avs adn letter

## 2015-02-22 ENCOUNTER — Other Ambulatory Visit: Payer: Self-pay | Admitting: Oncology

## 2015-02-23 ENCOUNTER — Encounter: Payer: Self-pay | Admitting: Gastroenterology

## 2015-03-03 ENCOUNTER — Telehealth: Payer: Self-pay | Admitting: Oncology

## 2015-03-03 ENCOUNTER — Ambulatory Visit (HOSPITAL_BASED_OUTPATIENT_CLINIC_OR_DEPARTMENT_OTHER): Payer: Commercial Managed Care - HMO | Admitting: Oncology

## 2015-03-03 ENCOUNTER — Other Ambulatory Visit (HOSPITAL_BASED_OUTPATIENT_CLINIC_OR_DEPARTMENT_OTHER): Payer: Commercial Managed Care - HMO

## 2015-03-03 ENCOUNTER — Telehealth: Payer: Self-pay | Admitting: *Deleted

## 2015-03-03 VITALS — BP 147/68 | HR 83 | Temp 98.5°F | Resp 18 | Ht 65.0 in | Wt 224.3 lb

## 2015-03-03 DIAGNOSIS — R252 Cramp and spasm: Secondary | ICD-10-CM | POA: Diagnosis not present

## 2015-03-03 DIAGNOSIS — R21 Rash and other nonspecific skin eruption: Secondary | ICD-10-CM | POA: Diagnosis not present

## 2015-03-03 DIAGNOSIS — C49A4 Gastrointestinal stromal tumor of large intestine: Secondary | ICD-10-CM | POA: Diagnosis not present

## 2015-03-03 DIAGNOSIS — Z86711 Personal history of pulmonary embolism: Secondary | ICD-10-CM | POA: Diagnosis not present

## 2015-03-03 DIAGNOSIS — Z86018 Personal history of other benign neoplasm: Secondary | ICD-10-CM

## 2015-03-03 LAB — CBC WITH DIFFERENTIAL/PLATELET
BASO%: 0.4 % (ref 0.0–2.0)
BASOS ABS: 0 10*3/uL (ref 0.0–0.1)
EOS%: 2.7 % (ref 0.0–7.0)
Eosinophils Absolute: 0.1 10*3/uL (ref 0.0–0.5)
HEMATOCRIT: 35.4 % (ref 34.8–46.6)
HEMOGLOBIN: 11.8 g/dL (ref 11.6–15.9)
LYMPH#: 0.9 10*3/uL (ref 0.9–3.3)
LYMPH%: 17.9 % (ref 14.0–49.7)
MCH: 29.5 pg (ref 25.1–34.0)
MCHC: 33.3 g/dL (ref 31.5–36.0)
MCV: 88.5 fL (ref 79.5–101.0)
MONO#: 0.2 10*3/uL (ref 0.1–0.9)
MONO%: 4.6 % (ref 0.0–14.0)
NEUT#: 3.9 10*3/uL (ref 1.5–6.5)
NEUT%: 74.4 % (ref 38.4–76.8)
Platelets: 241 10*3/uL (ref 145–400)
RBC: 4 10*6/uL (ref 3.70–5.45)
RDW: 14.6 % — AB (ref 11.2–14.5)
WBC: 5.3 10*3/uL (ref 3.9–10.3)

## 2015-03-03 LAB — COMPREHENSIVE METABOLIC PANEL (CC13)
ALBUMIN: 3.5 g/dL (ref 3.5–5.0)
ALK PHOS: 102 U/L (ref 40–150)
ALT: 11 U/L (ref 0–55)
AST: 16 U/L (ref 5–34)
Anion Gap: 7 mEq/L (ref 3–11)
BUN: 11.3 mg/dL (ref 7.0–26.0)
CALCIUM: 8.9 mg/dL (ref 8.4–10.4)
CO2: 22 mEq/L (ref 22–29)
CREATININE: 1 mg/dL (ref 0.6–1.1)
Chloride: 114 mEq/L — ABNORMAL HIGH (ref 98–109)
EGFR: 70 mL/min/{1.73_m2} — ABNORMAL LOW (ref >90)
Glucose: 101 mg/dl (ref 70–140)
POTASSIUM: 3.7 meq/L (ref 3.5–5.1)
Sodium: 143 mEq/L (ref 136–145)
Total Bilirubin: 0.76 mg/dL (ref 0.20–1.20)
Total Protein: 7.4 g/dL (ref 6.4–8.3)

## 2015-03-03 LAB — MAGNESIUM (CC13): MAGNESIUM: 2 mg/dL (ref 1.5–2.5)

## 2015-03-03 NOTE — Progress Notes (Addendum)
  [employer] OFFICE PROGRESS NOTE   Diagnosis: Gastrointestinal stromal tumor  INTERVAL HISTORY:   Brianna Walker returns as scheduled. She continues Gleevec. No diarrhea. She has developed a "sore "rash below the abdominal pannus. She complains of "cramps "when she stretches her arms or legs. She is scheduled to see Dr. Johney Maine on 03/16/2015.  Objective:  Vital signs in last 24 hours:  Blood pressure 147/68, pulse 83, temperature 98.5 F (36.9 C), temperature source Oral, resp. rate 18, height 5\' 5"  (1.651 m), weight 224 lb 4.8 oz (101.742 kg), SpO2 98 %.    HEENT: No thrush or ulcers Resp: Lungs clear bilaterally Cardio: Regular rate and rhythm GI: No hepatomegaly, nontender, masslike fullness in the mid lower abdomen Vascular: No leg edema  Skin: Hyperpigmented rash below the abdominal pannus extending into the groin    Lab Results:  Lab Results  Component Value Date   WBC 5.3 03/03/2015   HGB 11.8 03/03/2015   HCT 35.4 03/03/2015   MCV 88.5 03/03/2015   PLT 241 03/03/2015   NEUTROABS 3.9 03/03/2015    Potassium 3.7, creatinine 1.0, calcium 8.9  Medications: I have reviewed the patient's current medications.  Assessment/Plan: 1. Gastrointestinal stromal tumor of the rectum  Status post an endoscopic ultrasound on 08/06/2014 confirming a mass abutting the distal rectal wall with an FNA biopsy confirming a gastrointestinal stromal tumor  Initiation of Gleevec 09/04/2014  MRI pelvis 12/15/2014 with interval decreased size of the large anorectal mass with central necrosis.  Continuation of Gleevec. 2. Remote history of pulmonary embolism-maintained on apixaban 3. Multiple colon polyps noted on the colonoscopy 07/21/2014 with the pathology revealing tubular adenomas    Disposition:  Brianna Walker continues to tolerate the [place] well. She will see Dr. Johney Maine in 2 weeks. I will defer to Dr. Johney Maine regarding additional imaging prior to surgery. She will  continue Gleevec and return for an office visit here in one month.  She appears to have a yeast rash at the lower abdominal wall/groin. The leg cramps are most likely secondary to [place]. I encouraged her to maintain adequate hydration. We will check a magnesium level.  Brianna Coder, MD  03/03/2015  11:08 AM

## 2015-03-03 NOTE — Telephone Encounter (Signed)
Gave and printed appts ched and avs for pt for DEC  °

## 2015-03-03 NOTE — Telephone Encounter (Signed)
Called pt, cramping is likely related to [place]-- per Dr. Benay Spice. Pt voiced understanding, she thought this was the case. She will continue Gleevec.

## 2015-03-16 ENCOUNTER — Other Ambulatory Visit: Payer: Self-pay | Admitting: Surgery

## 2015-03-16 DIAGNOSIS — T792XXS Traumatic secondary and recurrent hemorrhage and seroma, sequela: Secondary | ICD-10-CM | POA: Diagnosis not present

## 2015-03-16 DIAGNOSIS — C49A4 Gastrointestinal stromal tumor of large intestine: Secondary | ICD-10-CM | POA: Diagnosis not present

## 2015-03-16 DIAGNOSIS — K5909 Other constipation: Secondary | ICD-10-CM | POA: Diagnosis not present

## 2015-03-16 DIAGNOSIS — T888XXS Other specified complications of surgical and medical care, not elsewhere classified, sequela: Secondary | ICD-10-CM | POA: Diagnosis not present

## 2015-03-16 NOTE — H&P (Signed)
Brianna Walker 03/16/2015 12:10 PM Location: [facility] Patient #: [id number] DOB: [birth year] Married / Language: English / Race: Black or African American Female  History of Present Illness Adin [name] MD; 03/16/2015 2:59 PM) Patient words: reck.  The patient is a 69 year old female who presents with an abdominal mass. Patient follows up for Extraluminal pelvic gastrointestinal stromal tumor.  Pleasant obese female. History of numerous abdominal surgeries. Has had ventral hernia repairs done in an open fashion by Dr. Bubba Camp whom has since retired. Has chronic fluid collection in periumbilical region.  Patient noted that her stools became more narrow over the past year or so. More constipated. Occasional rectal bleeding. Recall some history of hemorrhoids in the past. Has bowel movement about twice a week. No problems with incontinence to flatus or stool. No incontinence to urine. She had abdominal hysterectomy when she was in her forties for prolapse. Does not recall any diagnosis of cancer. She can walk a half hour without difficulty. She does not smoke. She is on chronic anticoagulation for recurrent pulmonary embolism. Been at least 5 years. She underwent colonoscopy and found to have an obvious bulging mass in her rectum. CT scan showed large bulky mass as well. Endorectal ultrasound noted mass between vagina and rectum in lower pelvis. No definite invasion into any organ. Moderately large fluid collection on anterior abdominal wall but not not solid. Biopsy of pelvic mass consistent with gastrointestinal stromal tumor. Surgical consultation requested.  Patient has undergone neoadjuvant chemotherapy with Gleevec. Started May 2016. She feels better. Less fullness. Less constipation. She thinks the mass has gotten smaller. No incontinence to flatus or stool. Here with a female friend        CLINICAL DATA: Malignant gastrointestinal stromal  tumor for restaging prior to surgical treatment. Subsequent encounter.  EXAM: MRI PELVIS WITHOUT AND WITH CONTRAST  TECHNIQUE: Multiplanar multisequence MR imaging of the pelvis was performed both before and after administration of intravenous contrast.  CONTRAST: 66mL MULTIHANCE GADOBENATE DIMEGLUMINE 529 MG/ML IV SOLN  COMPARISON: Pelvic CT 07/21/2004)  FINDINGS: Again demonstrated is a large anterior anorectal mass with anterior extension between the cervix and rectosigmoid junction. This mass has decreased in size compared with the prior CT, measuring 9.2 x 5.7 x 6.9 cm. The lesion is well-circumscribed with mild T2 heterogeneity. Post-contrast, there is a persistent enhancing nodule posteriorly in the mass which measures approximately 2.6 cm. The majority of the mass shows little to no enhancement, and there is some central necrosis.  The mass demonstrates no invasion of the vagina, rectum or bladder base, although there is extrinsic mass effect on those structures. A degree of pelvic floor laxity again noted. Proximal to the mass, the rectosigmoid colon is mildly distended and fluid-filled. There is no hydronephrosis or pelvic lymphadenopathy. Small inguinal lymph nodes bilaterally are unchanged.  A large complex fluid collection within the anterior abdominal wall is incompletely visualized by this examination, although grossly stable. This collection measures up to 12.5 cm transverse and does not demonstrate any abnormal enhancement.  IMPRESSION: 1. Interval decreased size of large anorectal mass with central necrosis. There is some peripheral enhancement posteriorly within the lesion. 2. No evidence of direct invasion of the vagina, cervix or bladder base. No evidence of distant metastases or hydronephrosis. 3. Grossly stable complex fluid collection within the anterior abdominal wall, incompletely visualized.   Electronically Signed By: Richardean Sale  M.D. On: 12/15/2014 08:17   CLINICAL DATA: Newly diagnosed rectal mass. Change in bowel habits.  Rectal bleeding.  EXAM: CT ABDOMEN AND PELVIS WITH CONTRAST  TECHNIQUE: Multidetector CT imaging of the abdomen and pelvis was performed using the standard protocol following bolus administration of intravenous contrast.  CONTRAST: 159mL OMNIPAQUE IOHEXOL 300 MG/ML SOLN  COMPARISON: 10/11/2005  FINDINGS: Lower chest: Clear lung bases. Moderate cardiomegaly with age advanced multivessel coronary artery atherosclerosis. Small hiatal hernia. No pericardial or pleural effusion. Note is made of prominence of the superior most aspect of the IVC, including at its insertion into the right atrium. Example image 12 series 2. This was present back on 10/11/2005. The hepatic veins and intrahepatic IVC are not distended.  Hepatobiliary: Normal liver. Cholecystectomy, without biliary ductal dilatation.  Pancreas: Normal, without mass or ductal dilatation.  Spleen: Normal  Adrenals/Urinary Tract: Normal adrenal glands. Too small to characterize lesions within both kidneys. No hydronephrosis. Degraded evaluation of the pelvis, secondary to beam hardening artifact from right hip arthroplasty. Grossly normal urinary bladder.  Stomach/Bowel: Normal remainder of stomach. Normal small bowel loops.  Normal terminal ileum. Normal caliber of the colon, without evidence of obstruction. Soft tissue mass involves the rectum and anus. There is likely a component of pelvic floor laxity and possible rectal prolapse. Mass measures on the order of 8.2 x 9.3 cm. Given above limitations, no perirectal adenopathy is identified.  Vascular/Lymphatic: Aortic and branch vessel atherosclerosis. No retroperitoneal or retrocrural adenopathy. No pelvic sidewall adenopathy.  Reproductive: Hysterectomy. No adnexal mass.  Other: No significant free fluid. No evidence of omental or peritoneal  disease.  Repeat ventral abdominal wall hernia repair. Transverse colon is positioned just deep to the hernia repair site, including on image 41 of series 2. Enlargement of an anterior pelvic wall collection which measures slightly greater than simple fluid density. 9.0 x 12.0 cm on image 66 versus 7.7 x 4.8 cm on the prior exam.  Musculoskeletal: Right hip arthroplasty. Left hip osteoarthritis. Minimal convex left lumbar spine curvature.  IMPRESSION: 1. Degraded evaluation of the pelvis, secondary to beam hardening artifact from right hip arthroplasty and less so patient body habitus. 2. Large anal-rectal mass, likely with superimposed pelvic floor laxity and/or rectal prolapse. No obstruction or surrounding adenopathy. 3. No evidence of distant metastasis. 4. Repeat ventral abdominal wall hernia repair with increase in complex fluid collection in the anterior pelvic wall. Likely a chronic hematoma. 5. Prominence of the supra hepatic IVC. Given chronicity back to 2007, likely within normal variation. 6. Age advanced coronary artery and aortic atherosclerosis. Recommend assessment of coronary risk factors and consideration of medical therapy.   Electronically Signed By: Abigail Miyamoto M.D. On: 07/22/2014 15:52    Problem List/Past Medical Adin [name], MD; 03/16/2015 12:55 PM) GASTROINTESTINAL STROMAL TUMOR (GIST) OF LARGE INTESTINE CHRONIC CONSTIPATION (K59.09) ABDOMINAL WALL SEROMA, SEQUELA (T88.8XXS)  Other Problems Adin [name], MD; 03/16/2015 12:55 PM) Back Pain Cerebrovascular Accident Gastroesophageal Reflux Disease Hemorrhoids High blood pressure Sickle cell disease  Past Surgical History Adin [name], MD; 03/16/2015 12:55 PM) Colon Polyp Removal - Colonoscopy Gallbladder Surgery - Laparoscopic Hip Surgery Right. Hysterectomy (not due to cancer) - Partial Shoulder Surgery Right.  Diagnostic Studies History Adin [name], MD;  03/16/2015 12:55 PM) Colonoscopy within last year Mammogram within last year Pap Smear >5 years ago  Allergies Marjean Donna, [place]; 03/16/2015 12:12 PM) No Known Drug Allergies 08/12/2014  Medication History (Sonya Bynum, CMA; 03/16/2015 12:12 PM) TraMADol HCl (50MG  Tablet, Oral) Active. Benazepril HCl (40MG  Tablet, Oral) Active. Eliquis (5MG  Tablet, Oral) Active. Estradiol (0.025MG /24HR Patch TW, Transdermal) Active.  MoviPrep (100GM For Solution, Oral) Active. Omeprazole (20MG  Capsule DR, Oral) Active. Pravastatin Sodium (20MG  Tablet, Oral) Active. Medications Reconciled  Social History Adin [name], MD; 03/16/2015 12:55 PM) Alcohol use Occasional alcohol use. Caffeine use Carbonated beverages, Coffee, Tea. No drug use Tobacco use Former smoker.  Family History Adin [name], MD; 03/16/2015 12:55 PM) Colon Cancer Father. Colon Polyps Father. Diabetes Mellitus Sister. Heart Disease Mother, Sister. Heart disease in female family member before age 6 Malignant Neoplasm Of Pancreas Father.  Pregnancy / Birth History Adin [name], MD; 03/16/2015 12:55 PM) Age at menarche 4 years. Age of menopause <47 Gravida 2 Irregular periods Maternal age 63-20 Para 2     Review of Systems Adin [name], MD; 03/16/2015 12:56 PM) General Present- Fatigue, Night Sweats and Weight Gain. Not Present- Appetite Loss, Chills, Fever and Weight Loss. Skin Present- Dryness. Not Present- Change in Wart/Mole, Hives, Jaundice, New Lesions, Non-Healing Wounds, Rash and Ulcer. HEENT Present- Seasonal Allergies, Sinus Pain, Visual Disturbances and Wears glasses/contact lenses. Not Present- Earache, Hearing Loss, Hoarseness, Nose Bleed, Oral Ulcers, Ringing in the Ears, Sore Throat and Yellow Eyes. Cardiovascular Present- Leg Cramps. Not Present- Chest Pain, Difficulty Breathing Lying Down, Palpitations, Rapid Heart Rate, Shortness of Breath and Swelling of  Extremities. Gastrointestinal Present- Bloating, Bloody Stool, Change in Bowel Habits, Constipation, Hemorrhoids and Rectal Pain. Not Present- Abdominal Pain, Chronic diarrhea, Difficulty Swallowing, Excessive gas, Gets full quickly at meals, Indigestion, Nausea and Vomiting. Female Genitourinary Present- Nocturia. Not Present- Frequency, Painful Urination, Pelvic Pain and Urgency. Musculoskeletal Present- Back Pain and Joint Stiffness. Not Present- Joint Pain, Muscle Pain, Muscle Weakness and Swelling of Extremities. Neurological Present- Decreased Memory. Not Present- Fainting, Headaches, Numbness, Seizures, Tingling, Tremor, Trouble walking and Weakness. Endocrine Present- Heat Intolerance and Hot flashes. Not Present- Cold Intolerance, Excessive Hunger, Hair Changes and New Diabetes. Hematology Present- Easy Bruising. Not Present- Excessive bleeding, Gland problems, HIV and Persistent Infections.  Vitals (Sonya Bynum CMA; 03/16/2015 12:12 PM) 03/16/2015 12:11 PM Weight: 223.8 lb Height: 63in Body Surface Area: 2.03 m Body Mass Index: 39.64 kg/m  Temp.: 71F(Temporal)  Pulse: 61 (Regular)  BP: 126/78 (Sitting, Left Arm, Standard)      Physical Exam Adin [name] MD; 03/16/2015 2:20 PM)  General Mental Status-Alert. General Appearance-Not in acute distress, Not Sickly. Orientation-Oriented X3. Hydration-Well hydrated. Voice-Normal.  Integumentary Global Assessment Normal Exam - Axillae: non-tender, no inflammation or ulceration, no drainage. and Distribution of scalp and body hair is normal. General Characteristics Temperature - normal warmth is noted.  Head and Neck Head-normocephalic, atraumatic with no lesions or palpable masses. Face Global Assessment - atraumatic, no absence of expression. Neck Global Assessment - no abnormal movements, no bruit auscultated on the right, no bruit auscultated on the left, no decreased range of motion,  non-tender. Trachea-midline. Thyroid Gland Characteristics - non-tender.  Eye Eyeball - Left-Extraocular movements intact, No Nystagmus. Eyeball - Right-Extraocular movements intact, No Nystagmus. Cornea - Left-No Hazy. Cornea - Right-No Hazy. Sclera/Conjunctiva - Left-No scleral icterus, No Discharge. Sclera/Conjunctiva - Right-No scleral icterus, No Discharge. Pupil - Left-Direct reaction to light normal. Pupil - Right-Direct reaction to light normal.  ENMT Ears Pinna - Left - no drainage observed, no generalized tenderness observed. Right - no drainage observed, no generalized tenderness observed. Nose and Sinuses Nose - no destructive lesion observed. Nares - Left - quiet respiration. Right - quiet respiration. Mouth and Throat Lips - Upper Lip - no fissures observed, no pallor noted. Lower Lip - no fissures observed,  no pallor noted. Nasopharynx - no discharge present. Oral Cavity/Oropharynx - Tongue - no dryness observed. Oral Mucosa - no cyanosis observed. Hypopharynx - no evidence of airway distress observed.  Chest and Lung Exam Inspection Movements - Normal and Symmetrical. Accessory muscles - No use of accessory muscles in breathing. Palpation Normal exam - Non-tender. Auscultation Breath sounds - Normal and Clear.  Cardiovascular Auscultation Rhythm - Regular. Murmurs & Other Heart Sounds - Normal exam - No Murmurs and No Systolic Clicks.  Abdomen Inspection Normal Exam - No Visible peristalsis and No Abnormal pulsations. Umbilicus - No Bleeding, No Urine drainage. Palpation/Percussion Normal exam - Soft, Non Tender, No Rebound tenderness, No Rigidity (guarding) and No Cutaneous hyperesthesia. Note: 10x10cm SQ spherical mass periumbilical. Morbidly obese. No abdominal tenderness. No guarding. No drainage.  Female Genitourinary Sexual Maturity Tanner 5 - Adult hair pattern. Note: Smooth but bulky 7 cm mass between posterior vaginal wall  and rectum. Comes down to but not into the sphincters. Maybe a little smaller. Not sensitive / fluctuant. Significant bulging into the vagina >> rectum but not completely obstructing. No erosion into the vagina or rectal mucosa. Normal sphincter tone. Posterior rectum smooth and not involved. Spherical. No vaginal bleeding nor discharge  Rectal Note: Exam done with assistance of female Medical Assistant in the room. Perianal skin clean with good hygiene. No pruritis ani. No pilonidal disease. No fissure. No abscess/fistula. Deceraes sphincter tone. Tolerates digital rectal exam. Again bulky anterior rectal wall mass involving a third of the circumference. More narrow now. Bulging in but not causing complete obstruction now. Comes down to but not into the sphincters. Some posterior hemorrhoid prolapse but nothing severe  Peripheral Vascular Upper Extremity Inspection - Left - No Cyanotic nailbeds, Not Ischemic. Right - No Cyanotic nailbeds, Not Ischemic.  Neurologic Neurologic evaluation reveals -normal attention span and ability to concentrate, able to name objects and repeat phrases. Appropriate fund of knowledge , normal sensation and normal coordination. Mental Status Affect - not angry, not paranoid. Cranial Nerves-Normal Bilaterally. Gait-Normal.  Neuropsychiatric Mental status exam performed with findings of-able to articulate well with normal speech/language, rate, volume and coherence, thought content normal with ability to perform basic computations and apply abstract reasoning and no evidence of hallucinations, delusions, obsessions or homicidal/suicidal ideation.  Musculoskeletal Global Assessment Spine, Ribs and Pelvis - no instability, subluxation or laxity. Right Upper Extremity - no instability, subluxation or laxity.  Lymphatic Head & Neck General Head & Neck Lymphatics: Bilateral - Description - No Localized lymphadenopathy. Axillary General Axillary  Region: Bilateral - Description - No Localized lymphadenopathy. Femoral & Inguinal Generalized Femoral & Inguinal Lymphatics: Left: Right - Description - No Localized lymphadenopathy. Description - No Localized lymphadenopathy.    Assessment & Plan Adin [name] MD; 03/16/2015 3:00 PM)  GASTROINTESTINAL STROMAL TUMOR (GIST) OF LARGE INTESTINE (C49.A4) Impression: Bulky tumor in the rectovaginal septum consistent with gastrointestinal stromal tumor. Most likely rectal wall etiology. Endorectal ultrasound and MRI show no invasion into rectum or vagina.  At some point this will require resection. I think I would like to start out robotically. See if we can get around the mass and see what requires removal. May be able to do just a rectal wall wedge resection of the mass although would like to have at least 2 cm margins to avoid recurrence. It may require posterior vaginal wall resection. It may shell out easily but I am skeptical that it will be that straightforward. Most likely will need low anterior resection with  very low anastomosis and temporary diverting loop ileostomy. Looking at the MRI, it does not seem to be originating in the distal rectum so hopefully can avoid an abdominoperineal resection and permanent colostomy.  Because there was some evidence of tumor necrosis by MRI, I think there is room for this tumor to continue to shrink. In re-reviewing the literature, most people benefit from neoadjuvant Gleevec 3-12 months preoperatively. She is just at the six-month phase. Would like to plan surgery 3 months from now to see if the mass will shrink more, but I suspect we are getting close to the nadir since and has not shrunk as much since last exam.  The patient and her mother feel reassured and agree with this plan.  The anatomy & physiology of the digestive tract was discussed.  The pathophysiology of the rectal pathology was discussed.  Natural history risks without surgery was  discussed.   I worked to give an overview of the disease and the frequent need to have multispecialty involvement.  I feel the risks of no intervention will lead to serious problems that outweigh the operative risks; therefore, I recommended a partial proctocolectomy to remove the pathology.  Minimally Invasive (Robotic/Laparoscopic) & open techniques were discussed.  We will work to preserve anal & pelvic floor function without sacrificing cure.  Risks such as bleeding, infection, abscess, leak, reoperation, possible temporary or permanent ostomy, hernia, stroke, heart attack, death, and other risks were discussed.  I noted a good likelihood this will help address the problem.   Goals of post-operative recovery were discussed as well.  We will work to minimize complications.  Educational information was available as well.  Questions were answered.  The patient expresses understanding & wishes to proceed with surgery.   Current Plans Instructions:  You have a large bulky mass in your lower pelvis between your vagina and rectum. Biopsy consistent with gastrointestinal stromal tumor = GIST. You will benefit from surgical resection for this.  You would benefit from continued use of Gleevec chemotherapy to shrink the tumor. Most likely will need 6-9 months, depending on shrinkage of tumor. Would not wait more than 12 months of chemotherapy.  We'll plan to do surgical resection in January 2017 after the Holidays. That allows the tumor to be as small as possible.  Follow up if no improvement or if symptoms worsen Pt Education - [facility] Pelvic Floor Exercises (Kegels) and Dysfunction HCI (Cayman Brogden) Pt Education - Patient information: Soft tissue sarcoma (The Basics): discussed with patient and provided information. You are being scheduled for surgery - Our schedulers will call you.  You should hear from our office's scheduling department within 5 working days about the location, date, and time of surgery. We  try to make accommodations for patient's preferences in scheduling surgery, but sometimes the OR schedule or the surgeon's schedule prevents [place] from making those accommodations.  If you have not heard from our office [phone]) in 5 working days, call the office and ask for your surgeon's nurse.  If you have other questions about your diagnosis, plan, or surgery, call the office and ask for your surgeon's nurse.  Written instructions provided I recommended obtaining preoperative cardiac clearance. I am concerned about the health of the patient and the ability to tolerate the operation. Since she has not had any new cardiopulmonary events, her primary care physician Dr. Jenny Reichmann feels it is safe to come off the Eliquis anticoagulation 3 days prior to surgery Pt Education - [facility] Colon Bowel Prep 2015  Miralax/Antibiotics Started Neomycin Sulfate 500MG , 2 (two) Tablet SEE NOTE, #6, 03/16/2015, No Refill. Local Order: TAKE TWO TABLETS AT 2 PM, 3 PM, AND 10 PM THE DAY PRIOR TO SURGERY Started Flagyl 500MG , 2 (two) Tablet SEE NOTE, #6, 03/16/2015, No Refill. Local Order: Take at 2pm, 3pm, and 10pm the day prior to your colon operation Pt Education - [facility]CS Colectomy post-op instructions: discussed with patient and provided information. Pt Education - [facility] Pelvic Floor Exercises (Kegels) and Dysfunction HCI (Mannat Benedetti) CHRONIC CONSTIPATION (K59.09)  Current Plans Pt Education - [facility] Good Bowel Health (Nichole Neyer) ABDOMINAL WALL SEROMA, SEQUELA (T88.8XXS) Impression: She does have any large anterior abdominal wall fluid collection consistent with chronic fluid/seroma in hernia sac. It is a separate issue. While the seroma was 2007 and has gotten slightly larger, there is no pelvic mass then. Her uterus resection pathology was decades ago but she assures me it was not for cancer and was for prolapse issues. Would not try and manage his at the same time as the pelvic resection as risks of infections or higher. Can address  that and a separate surgery after recovering from the more recent surgery. Would not like to aggressively treat that area unless it limits surgery.  Adin [name], M.D., F.A.C.S. Gastrointestinal and Minimally Invasive Surgery [facility] [address] [place], [place] [address] [phone] Main / Paging

## 2015-03-29 ENCOUNTER — Telehealth: Payer: Self-pay | Admitting: Oncology

## 2015-03-29 NOTE — Telephone Encounter (Signed)
LT out - moved 12/2 f/u to BS. Left message for patient and mailed schedule.

## 2015-03-30 ENCOUNTER — Other Ambulatory Visit: Payer: Self-pay | Admitting: *Deleted

## 2015-03-30 ENCOUNTER — Telehealth: Payer: Self-pay | Admitting: Oncology

## 2015-03-30 NOTE — Telephone Encounter (Signed)
Per 11/29 pof moved 12/2 appointments to 12/1. Left message for patient.

## 2015-04-01 ENCOUNTER — Telehealth: Payer: Self-pay | Admitting: Oncology

## 2015-04-01 ENCOUNTER — Other Ambulatory Visit (HOSPITAL_BASED_OUTPATIENT_CLINIC_OR_DEPARTMENT_OTHER): Payer: Commercial Managed Care - HMO

## 2015-04-01 ENCOUNTER — Ambulatory Visit (HOSPITAL_BASED_OUTPATIENT_CLINIC_OR_DEPARTMENT_OTHER): Payer: Commercial Managed Care - HMO | Admitting: Oncology

## 2015-04-01 VITALS — BP 145/88 | HR 97 | Temp 98.3°F | Resp 20 | Ht 65.0 in | Wt 226.7 lb

## 2015-04-01 DIAGNOSIS — Z86711 Personal history of pulmonary embolism: Secondary | ICD-10-CM

## 2015-04-01 DIAGNOSIS — Z7901 Long term (current) use of anticoagulants: Secondary | ICD-10-CM

## 2015-04-01 DIAGNOSIS — C49A4 Gastrointestinal stromal tumor of large intestine: Secondary | ICD-10-CM | POA: Diagnosis not present

## 2015-04-01 LAB — COMPREHENSIVE METABOLIC PANEL (CC13)
ALT: 9 U/L (ref 0–55)
ANION GAP: 7 meq/L (ref 3–11)
AST: 17 U/L (ref 5–34)
Albumin: 3.5 g/dL (ref 3.5–5.0)
Alkaline Phosphatase: 110 U/L (ref 40–150)
BUN: 8.5 mg/dL (ref 7.0–26.0)
CHLORIDE: 111 meq/L — AB (ref 98–109)
CO2: 24 meq/L (ref 22–29)
CREATININE: 1 mg/dL (ref 0.6–1.1)
Calcium: 8.8 mg/dL (ref 8.4–10.4)
EGFR: 69 mL/min/{1.73_m2} — ABNORMAL LOW (ref >90)
Glucose: 83 mg/dl (ref 70–140)
Potassium: 3.9 mEq/L (ref 3.5–5.1)
SODIUM: 142 meq/L (ref 136–145)
Total Bilirubin: 0.64 mg/dL (ref 0.20–1.20)
Total Protein: 7.8 g/dL (ref 6.4–8.3)

## 2015-04-01 LAB — CBC WITH DIFFERENTIAL/PLATELET
BASO%: 0.3 % (ref 0.0–2.0)
Basophils Absolute: 0 10*3/uL (ref 0.0–0.1)
EOS ABS: 0.1 10*3/uL (ref 0.0–0.5)
EOS%: 1.9 % (ref 0.0–7.0)
HCT: 35.4 % (ref 34.8–46.6)
HGB: 11.9 g/dL (ref 11.6–15.9)
LYMPH%: 24.3 % (ref 14.0–49.7)
MCH: 29.5 pg (ref 25.1–34.0)
MCHC: 33.6 g/dL (ref 31.5–36.0)
MCV: 87.8 fL (ref 79.5–101.0)
MONO#: 0.2 10*3/uL (ref 0.1–0.9)
MONO%: 3.5 % (ref 0.0–14.0)
NEUT%: 70 % (ref 38.4–76.8)
NEUTROS ABS: 4 10*3/uL (ref 1.5–6.5)
PLATELETS: 269 10*3/uL (ref 145–400)
RBC: 4.03 10*6/uL (ref 3.70–5.45)
RDW: 14.6 % — ABNORMAL HIGH (ref 11.2–14.5)
WBC: 5.7 10*3/uL (ref 3.9–10.3)
lymph#: 1.4 10*3/uL (ref 0.9–3.3)

## 2015-04-01 NOTE — Progress Notes (Signed)
  [facility] OFFICE PROGRESS NOTE   Diagnosis: Gastrointestinal stromal tumor  INTERVAL HISTORY:   Ms. Raifsnider returns as scheduled. She continues Gleevec. She complains of "swelling ". No rash or diarrhea. She continues to have cramps in the legs and sides of the abdomen/chest. She has intermittent nausea, relieved with Compazine.  She saw Dr. Johney Maine and is being scheduled for surgery in January.  Objective:  Vital signs in last 24 hours:  Blood pressure 145/88, pulse 97, temperature 98.3 F (36.8 C), temperature source Oral, resp. rate 20, height 5\' 5"  (1.651 m), weight 226 lb 11.2 oz (102.83 kg), SpO2 98 %.    HEENT: No thrush or ulcers, mild periorbital edema good Resp: Lungs clear bilaterally Cardio: Regular rate and rhythm GI: No hepatomegaly, nontender, no apparent ascites, masslike fullness in the lower mid abdomen Vascular: No leg edema   Lab Results:  Lab Results  Component Value Date   WBC 5.7 04/01/2015   HGB 11.9 04/01/2015   HCT 35.4 04/01/2015   MCV 87.8 04/01/2015   PLT 269 04/01/2015   NEUTROABS 4.0 04/01/2015   potassium 3.9, creatinine 1.0   Medications: I have reviewed the patient's current medications.  Assessment/Plan: 1. Gastrointestinal stromal tumor of the rectum  Status post an endoscopic ultrasound on 08/06/2014 confirming a mass abutting the distal rectal wall with an FNA biopsy confirming a gastrointestinal stromal tumor  Initiation of Gleevec 09/04/2014  MRI pelvis 12/15/2014 with interval decreased size of the large anorectal mass with central necrosis.  Continuation of Gleevec. 2. Remote history of pulmonary embolism-maintained on apixaban 3. Multiple colon polyps noted on the colonoscopy 07/21/2014 with the pathology revealing tubular adenomas     Disposition:  Ms. Mouradian appears stable. She will continue Gleevec. She will discontinue Gleevec for 2 days prior to surgery.  Ms. Cogdell will return for an  office and lab visit on 05/05/2015.  Betsy Coder, MD  04/01/2015  3:11 PM

## 2015-04-01 NOTE — Telephone Encounter (Signed)
per pof to sch pt appt-gave pt copy of avs °

## 2015-04-02 ENCOUNTER — Other Ambulatory Visit: Payer: Commercial Managed Care - HMO

## 2015-04-02 ENCOUNTER — Ambulatory Visit: Payer: Commercial Managed Care - HMO | Admitting: Oncology

## 2015-05-05 ENCOUNTER — Other Ambulatory Visit (HOSPITAL_BASED_OUTPATIENT_CLINIC_OR_DEPARTMENT_OTHER): Payer: Commercial Managed Care - HMO

## 2015-05-05 ENCOUNTER — Telehealth: Payer: Self-pay | Admitting: Oncology

## 2015-05-05 ENCOUNTER — Ambulatory Visit (HOSPITAL_BASED_OUTPATIENT_CLINIC_OR_DEPARTMENT_OTHER): Payer: Commercial Managed Care - HMO | Admitting: Nurse Practitioner

## 2015-05-05 VITALS — BP 141/85 | HR 88 | Temp 97.7°F | Resp 19 | Wt 227.8 lb

## 2015-05-05 DIAGNOSIS — C49A4 Gastrointestinal stromal tumor of large intestine: Secondary | ICD-10-CM

## 2015-05-05 DIAGNOSIS — Z86711 Personal history of pulmonary embolism: Secondary | ICD-10-CM

## 2015-05-05 DIAGNOSIS — Z23 Encounter for immunization: Secondary | ICD-10-CM | POA: Diagnosis not present

## 2015-05-05 LAB — CBC WITH DIFFERENTIAL/PLATELET
BASO%: 0.6 % (ref 0.0–2.0)
BASOS ABS: 0 10*3/uL (ref 0.0–0.1)
EOS%: 2 % (ref 0.0–7.0)
Eosinophils Absolute: 0.1 10*3/uL (ref 0.0–0.5)
HCT: 36.3 % (ref 34.8–46.6)
HGB: 11.9 g/dL (ref 11.6–15.9)
LYMPH#: 1.2 10*3/uL (ref 0.9–3.3)
LYMPH%: 18.7 % (ref 14.0–49.7)
MCH: 28.9 pg (ref 25.1–34.0)
MCHC: 32.8 g/dL (ref 31.5–36.0)
MCV: 88.2 fL (ref 79.5–101.0)
MONO#: 0.3 10*3/uL (ref 0.1–0.9)
MONO%: 4.7 % (ref 0.0–14.0)
NEUT#: 4.6 10*3/uL (ref 1.5–6.5)
NEUT%: 74 % (ref 38.4–76.8)
Platelets: 284 10*3/uL (ref 145–400)
RBC: 4.11 10*6/uL (ref 3.70–5.45)
RDW: 15.1 % — ABNORMAL HIGH (ref 11.2–14.5)
WBC: 6.3 10*3/uL (ref 3.9–10.3)

## 2015-05-05 LAB — COMPREHENSIVE METABOLIC PANEL
ALBUMIN: 3.5 g/dL (ref 3.5–5.0)
ALK PHOS: 111 U/L (ref 40–150)
ALT: 13 U/L (ref 0–55)
AST: 19 U/L (ref 5–34)
Anion Gap: 8 mEq/L (ref 3–11)
BILIRUBIN TOTAL: 0.86 mg/dL (ref 0.20–1.20)
BUN: 13.8 mg/dL (ref 7.0–26.0)
CO2: 23 mEq/L (ref 22–29)
CREATININE: 1.2 mg/dL — AB (ref 0.6–1.1)
Calcium: 8.5 mg/dL (ref 8.4–10.4)
Chloride: 112 mEq/L — ABNORMAL HIGH (ref 98–109)
EGFR: 55 mL/min/{1.73_m2} — AB (ref >90)
GLUCOSE: 99 mg/dL (ref 70–140)
Potassium: 3.5 mEq/L (ref 3.5–5.1)
SODIUM: 143 meq/L (ref 136–145)
TOTAL PROTEIN: 7.7 g/dL (ref 6.4–8.3)

## 2015-05-05 MED ORDER — INFLUENZA VAC SPLIT QUAD 0.5 ML IM SUSY
0.5000 mL | PREFILLED_SYRINGE | Freq: Once | INTRAMUSCULAR | Status: AC
  Administered 2015-05-05: 0.5 mL via INTRAMUSCULAR
  Filled 2015-05-05: qty 0.5

## 2015-05-05 NOTE — Telephone Encounter (Signed)
Gv pt appt for 06/01/15.

## 2015-05-05 NOTE — Progress Notes (Signed)
  [facility] OFFICE PROGRESS NOTE   Diagnosis:  Gastrointestinal stromal tumor   INTERVAL HISTORY:   [name] returns as scheduled. She continues Gleevec. She notes progressive fatigue. She has periodic mild nausea. No diarrhea. No rash. No pain.  Objective:  Vital signs in last 24 hours:  Blood pressure 141/85, pulse 88, temperature 97.7 F (36.5 C), temperature source Oral, resp. rate 19, weight 227 lb 12.8 oz (103.329 kg), SpO2 98 %.    HEENT: No thrush or ulcers. Resp: Lungs clear bilaterally. Cardio: Regular rate and rhythm. GI: Abdomen soft, nontender. No hepatomegaly. Masslike fullness lower mid abdomen. Vascular: No leg edema. Skin: No rash.    Lab Results:  Lab Results  Component Value Date   WBC 6.3 05/05/2015   HGB 11.9 05/05/2015   HCT 36.3 05/05/2015   MCV 88.2 05/05/2015   PLT 284 05/05/2015   NEUTROABS 4.6 05/05/2015    Imaging:  No results found.  Medications: I have reviewed the patient's current medications.  Assessment/Plan: 1. Gastrointestinal stromal tumor of the rectum  Status post an endoscopic ultrasound on 08/06/2014 confirming a mass abutting the distal rectal wall with an FNA biopsy confirming a gastrointestinal stromal tumor  Initiation of Gleevec 09/04/2014  MRI pelvis 12/15/2014 with interval decreased size of the large anorectal mass with central necrosis.  Continuation of Gleevec. 2. Remote history of pulmonary embolism-maintained on apixaban 3. Multiple colon polyps noted on the colonoscopy 07/21/2014 with the pathology revealing tubular adenomas   Disposition: Brianna Walker appears stable. She will continue Gleevec. The plan is to discontinue Gleevec for 2 days prior to surgery.  She will return for an office visit and labs in approximately 4 weeks.  Influenza vaccine administered at today's visit.    Ned Card ANP/GNP-BC   05/05/2015  2:39 PM

## 2015-05-19 ENCOUNTER — Other Ambulatory Visit: Payer: Self-pay | Admitting: Oncology

## 2015-05-19 MED FILL — GLEEVEC 400 MG TABLET: 400 | 30 days supply | Qty: 30 | Fill #0

## 2015-06-01 ENCOUNTER — Telehealth: Payer: Self-pay | Admitting: Oncology

## 2015-06-01 ENCOUNTER — Encounter (HOSPITAL_COMMUNITY): Payer: Self-pay

## 2015-06-01 ENCOUNTER — Ambulatory Visit (HOSPITAL_BASED_OUTPATIENT_CLINIC_OR_DEPARTMENT_OTHER): Payer: Commercial Managed Care - HMO | Admitting: Oncology

## 2015-06-01 ENCOUNTER — Encounter (HOSPITAL_COMMUNITY)
Admission: RE | Admit: 2015-06-01 | Discharge: 2015-06-01 | Disposition: A | Discharge status: Home / Self Care | Payer: Commercial Managed Care - HMO | Source: Ambulatory Visit | Attending: Surgery | Admitting: Surgery

## 2015-06-01 ENCOUNTER — Other Ambulatory Visit: Payer: Commercial Managed Care - HMO

## 2015-06-01 VITALS — BP 130/82 | HR 73 | Temp 98.0°F | Resp 18 | Ht 65.0 in | Wt 226.8 lb

## 2015-06-01 DIAGNOSIS — N3281 Overactive bladder: Secondary | ICD-10-CM | POA: Diagnosis present

## 2015-06-01 DIAGNOSIS — Z96641 Presence of right artificial hip joint: Secondary | ICD-10-CM | POA: Diagnosis present

## 2015-06-01 DIAGNOSIS — Z86010 Personal history of colonic polyps: Secondary | ICD-10-CM | POA: Diagnosis not present

## 2015-06-01 DIAGNOSIS — Z8673 Personal history of transient ischemic attack (TIA), and cerebral infarction without residual deficits: Secondary | ICD-10-CM | POA: Diagnosis not present

## 2015-06-01 DIAGNOSIS — K598 Other specified functional intestinal disorders: Secondary | ICD-10-CM | POA: Diagnosis not present

## 2015-06-01 DIAGNOSIS — Z01812 Encounter for preprocedural laboratory examination: Secondary | ICD-10-CM

## 2015-06-01 DIAGNOSIS — E875 Hyperkalemia: Secondary | ICD-10-CM | POA: Diagnosis not present

## 2015-06-01 DIAGNOSIS — E669 Obesity, unspecified: Secondary | ICD-10-CM | POA: Diagnosis present

## 2015-06-01 DIAGNOSIS — G4700 Insomnia, unspecified: Secondary | ICD-10-CM | POA: Diagnosis present

## 2015-06-01 DIAGNOSIS — E876 Hypokalemia: Secondary | ICD-10-CM | POA: Diagnosis not present

## 2015-06-01 DIAGNOSIS — Z79891 Long term (current) use of opiate analgesic: Secondary | ICD-10-CM | POA: Diagnosis not present

## 2015-06-01 DIAGNOSIS — Z01818 Encounter for other preprocedural examination: Secondary | ICD-10-CM

## 2015-06-01 DIAGNOSIS — Z8249 Family history of ischemic heart disease and other diseases of the circulatory system: Secondary | ICD-10-CM | POA: Diagnosis not present

## 2015-06-01 DIAGNOSIS — Z6837 Body mass index (BMI) 37.0-37.9, adult: Secondary | ICD-10-CM | POA: Diagnosis not present

## 2015-06-01 DIAGNOSIS — I739 Peripheral vascular disease, unspecified: Secondary | ICD-10-CM | POA: Diagnosis not present

## 2015-06-01 DIAGNOSIS — Z8672 Personal history of thrombophlebitis: Secondary | ICD-10-CM | POA: Diagnosis not present

## 2015-06-01 DIAGNOSIS — E785 Hyperlipidemia, unspecified: Secondary | ICD-10-CM | POA: Diagnosis present

## 2015-06-01 DIAGNOSIS — D571 Sickle-cell disease without crisis: Secondary | ICD-10-CM | POA: Diagnosis present

## 2015-06-01 DIAGNOSIS — J439 Emphysema, unspecified: Secondary | ICD-10-CM | POA: Diagnosis not present

## 2015-06-01 DIAGNOSIS — C49A5 Gastrointestinal stromal tumor of rectum: Secondary | ICD-10-CM | POA: Diagnosis present

## 2015-06-01 DIAGNOSIS — Z86711 Personal history of pulmonary embolism: Secondary | ICD-10-CM

## 2015-06-01 DIAGNOSIS — Z0183 Encounter for blood typing: Secondary | ICD-10-CM | POA: Insufficient documentation

## 2015-06-01 DIAGNOSIS — K660 Peritoneal adhesions (postprocedural) (postinfection): Secondary | ICD-10-CM | POA: Diagnosis present

## 2015-06-01 DIAGNOSIS — Z8371 Family history of colonic polyps: Secondary | ICD-10-CM | POA: Diagnosis not present

## 2015-06-01 DIAGNOSIS — Z800 Family history of malignant neoplasm of digestive organs: Secondary | ICD-10-CM | POA: Diagnosis not present

## 2015-06-01 DIAGNOSIS — Z8701 Personal history of pneumonia (recurrent): Secondary | ICD-10-CM | POA: Diagnosis not present

## 2015-06-01 DIAGNOSIS — C49A4 Gastrointestinal stromal tumor of large intestine: Secondary | ICD-10-CM

## 2015-06-01 DIAGNOSIS — D509 Iron deficiency anemia, unspecified: Secondary | ICD-10-CM | POA: Diagnosis present

## 2015-06-01 DIAGNOSIS — I10 Essential (primary) hypertension: Secondary | ICD-10-CM | POA: Diagnosis present

## 2015-06-01 DIAGNOSIS — Z01810 Encounter for preprocedural cardiovascular examination: Secondary | ICD-10-CM | POA: Diagnosis not present

## 2015-06-01 DIAGNOSIS — K5730 Diverticulosis of large intestine without perforation or abscess without bleeding: Secondary | ICD-10-CM | POA: Diagnosis not present

## 2015-06-01 DIAGNOSIS — K567 Ileus, unspecified: Secondary | ICD-10-CM | POA: Diagnosis not present

## 2015-06-01 DIAGNOSIS — Z833 Family history of diabetes mellitus: Secondary | ICD-10-CM | POA: Diagnosis not present

## 2015-06-01 DIAGNOSIS — K219 Gastro-esophageal reflux disease without esophagitis: Secondary | ICD-10-CM | POA: Diagnosis present

## 2015-06-01 DIAGNOSIS — I2699 Other pulmonary embolism without acute cor pulmonale: Secondary | ICD-10-CM | POA: Diagnosis present

## 2015-06-01 DIAGNOSIS — Z7901 Long term (current) use of anticoagulants: Secondary | ICD-10-CM | POA: Diagnosis not present

## 2015-06-01 DIAGNOSIS — Z79899 Other long term (current) drug therapy: Secondary | ICD-10-CM | POA: Diagnosis not present

## 2015-06-01 DIAGNOSIS — G894 Chronic pain syndrome: Secondary | ICD-10-CM | POA: Diagnosis present

## 2015-06-01 DIAGNOSIS — K449 Diaphragmatic hernia without obstruction or gangrene: Secondary | ICD-10-CM | POA: Diagnosis present

## 2015-06-01 DIAGNOSIS — E8881 Metabolic syndrome: Secondary | ICD-10-CM | POA: Diagnosis present

## 2015-06-01 HISTORY — DX: Respiratory tuberculosis unspecified: A15.9

## 2015-06-01 HISTORY — DX: Reserved for inherently not codable concepts without codable children: IMO0001

## 2015-06-01 HISTORY — DX: Malignant (primary) neoplasm, unspecified: C80.1

## 2015-06-01 LAB — COMPREHENSIVE METABOLIC PANEL
ALK PHOS: 106 U/L (ref 38–126)
ALT: 14 U/L (ref 14–54)
ANION GAP: 9 (ref 5–15)
AST: 20 U/L (ref 15–41)
Albumin: 3.8 g/dL (ref 3.5–5.0)
BILIRUBIN TOTAL: 0.9 mg/dL (ref 0.3–1.2)
BUN: 15 mg/dL (ref 6–20)
CALCIUM: 9.3 mg/dL (ref 8.9–10.3)
CO2: 26 mmol/L (ref 22–32)
Chloride: 108 mmol/L (ref 101–111)
Creatinine, Ser: 1.04 mg/dL — ABNORMAL HIGH (ref 0.44–1.00)
GFR calc Af Amer: >60 mL/min (ref >60)
GFR, EST NON AFRICAN AMERICAN: 54 mL/min — AB (ref >60)
GLUCOSE: 85 mg/dL (ref 65–99)
POTASSIUM: 4 mmol/L (ref 3.5–5.1)
SODIUM: 143 mmol/L (ref 135–145)
TOTAL PROTEIN: 8 g/dL (ref 6.5–8.1)

## 2015-06-01 LAB — CBC WITH DIFFERENTIAL/PLATELET
BASOS ABS: 0 10*3/uL (ref 0.0–0.1)
BASOS PCT: 0 %
EOS ABS: 0.1 10*3/uL (ref 0.0–0.7)
EOS PCT: 2 %
HCT: 36 % (ref 36.0–46.0)
Hemoglobin: 11.8 g/dL — ABNORMAL LOW (ref 12.0–15.0)
LYMPHS ABS: 1.3 10*3/uL (ref 0.7–4.0)
Lymphocytes Relative: 24 %
MCH: 29.4 pg (ref 26.0–34.0)
MCHC: 32.8 g/dL (ref 30.0–36.0)
MCV: 89.6 fL (ref 78.0–100.0)
Monocytes Absolute: 0.3 10*3/uL (ref 0.1–1.0)
Monocytes Relative: 5 %
Neutro Abs: 3.8 10*3/uL (ref 1.7–7.7)
Neutrophils Relative %: 69 %
PLATELETS: 288 10*3/uL (ref 150–400)
RBC: 4.02 MIL/uL (ref 3.87–5.11)
RDW: 14.3 % (ref 11.5–15.5)
WBC: 5.5 10*3/uL (ref 4.0–10.5)

## 2015-06-01 LAB — ABO/RH: ABO/RH(D): O POS

## 2015-06-01 NOTE — Anesthesia Preprocedure Evaluation (Addendum)
Anesthesia Evaluation  Patient identified by MRN, date of birth, ID band Patient awake    Reviewed: Allergy & Precautions, H&P , NPO status , Patient's Chart, lab work & pertinent test results, reviewed documented beta blocker date and time   Airway Mallampati: II  TM Distance: >3 FB Neck ROM: full    Dental no notable dental hx. (+) Teeth Intact, Dental Advisory Given   Pulmonary shortness of breath and with exertion, former smoker (Quit 1979), PE TMJ syndrome   Pulmonary exam normal breath sounds clear to auscultation       Cardiovascular hypertension, Pt. on medications + Peripheral Vascular Disease and + DOE  Normal cardiovascular exam Rhythm:regular Rate:Normal  ECHO 2012 EF 65 Grade I diastolic sysfunction, HX of PE 5 years ago   Neuro/Psych TIAnegative psych ROS   GI/Hepatic Neg liver ROS, GERD  Medicated and Controlled,  Endo/Other  negative endocrine ROS  Renal/GU negative Renal ROS  negative genitourinary   Musculoskeletal   Abdominal (+) + obese,  Abdomen: soft.    Peds  Hematology negative hematology ROS (+) 12/36 05/05/2015   Anesthesia Other Findings   Reproductive/Obstetrics negative OB ROS                            Anesthesia Physical  Anesthesia Plan  ASA: III  Anesthesia Plan: General   Post-op Pain Management:    Induction: Intravenous  Airway Management Planned: Oral ETT  Additional Equipment:   Intra-op Plan:   Post-operative Plan:   Informed Consent: I have reviewed the patients History and Physical, chart, labs and discussed the procedure including the risks, benefits and alternatives for the proposed anesthesia with the patient or authorized representative who has indicated his/her understanding and acceptance.   Dental Advisory Given  Plan Discussed with: CRNA and Surgeon  Anesthesia Plan Comments: (2nd IV if both arms tucked, H & P suggests AP  resection likely, TMJ should not be an issue)       Anesthesia Quick Evaluation

## 2015-06-01 NOTE — Telephone Encounter (Signed)
Talked with and scheduled this patient’s appointment(s) while patient was here in our office.       [facility]. °

## 2015-06-01 NOTE — Consult Note (Addendum)
WOC ostomy consult note Patient seen per Dr. Johney Maine' request for preoperative stoma site selection; sites for both an ileostomy and colostomy are requested. Abdomen assessed in the sitting and standing positions; patient with soft, obese abdomen with evidence of previous surgeries in both vertical and horizontal planes.  Three sites are selected; one for an ileostomy and a first and second choice for a colostomy.  Please note there is a significant crease in the supraumbilical area.  RLQ (Ileostomy) site is 6.5cm to the right of the umbilicus and [id number] below LLQ (colostomy) sites are:  #1 choice:  10cm to the left of the umbilicus and [id number] below #2 choice:    6cm to the left of the umbilicus and 2cm below (This site is not as easy to see (for self care purposes)  as the #1 choice) Thanks you for allowing [place] to see this patient in the presurgical testing session.  While she is hopeful that she not require a stoma, she understands that an ostomy nurse would be available to assist her should one be created. [place] nursing team will not follow, but will remain available to this patient, the nursing and medical teams.  Please re-consult if an ostomy is created intraoperatively. Thanks, Maudie Flakes, MSN, RN, [place], Arther Abbott  Pager# [phone]

## 2015-06-01 NOTE — Pre-Procedure Instructions (Addendum)
Ekg done today. Labs today done for cancer center CBC/d,CMP. Bowel prep instructions were faxed upon request by pt from [facility].-pt given copy and copy with chart. 06-01-15 1430 Ostomy Rn Laurie McNichol -in to visit with pt for preop markings.

## 2015-06-01 NOTE — Progress Notes (Signed)
  [place] OFFICE PROGRESS NOTE   Diagnosis: Gastrointestinal stromal tumor  INTERVAL HISTORY:   Brianna Walker returns as scheduled. She continues Gleevec. No diarrhea or swelling. She has developed a "rash "in the left axilla. She is scheduled for surgery later this week.  Objective:  Vital signs in last 24 hours:  Blood pressure 130/82, pulse 73, temperature 98 F (36.7 C), temperature source Oral, resp. rate 18, height 5\' 5"  (1.651 m), weight 226 lb 12.8 oz (102.876 kg), SpO2 98 %.    HEENT: No thrush or ulcers Resp: Lungs clear bilaterally Cardio: Regular rate and rhythm GI: No hepatosplenomegaly, mass like fullness in the lower abdomen Vascular: No leg edema, the right leg is slightly larger than the left leg throughout (see she reports this is a chronic finding). No erythema or tenderness  Skin: Yeast rash in the left groin, resolving yeast rash in the left axilla     Lab Results:  Lab Results  Component Value Date   WBC 5.5 06/01/2015   HGB 11.8* 06/01/2015   HCT 36.0 06/01/2015   MCV 89.6 06/01/2015   PLT 288 06/01/2015   NEUTROABS 3.8 06/01/2015     Medications: I have reviewed the patient's current medications.  Assessment/Plan: 1. Gastrointestinal stromal tumor of the rectum  Status post an endoscopic ultrasound on 08/06/2014 confirming a mass abutting the distal rectal wall with an FNA biopsy confirming a gastrointestinal stromal tumor  Initiation of Gleevec 09/04/2014  MRI pelvis 12/15/2014 with interval decreased size of the large anorectal mass with central necrosis.  Continuation of Gleevec. 2. Remote history of pulmonary embolism-maintained on apixaban 3. Multiple colon polyps noted on the colonoscopy 07/21/2014 with the pathology revealing tubular adenomas     Disposition:  Brianna Walker appears stable. She will discontinue Gleevec after today's dose. She has stopped apixaban in anticipation of surgery.  We will see her  approximate 3 weeks following surgery with the plan to resume [place].  Betsy Coder, MD  06/01/2015  4:40 PM

## 2015-06-01 NOTE — Patient Instructions (Signed)
Brianna Walker  06/01/2015   Your procedure is scheduled on: 06-04-15   Report to [facility]  Entrance take [facility]  elevators to 3rd floor to  [place] at  [place] AM.  Call this number if you have problems the morning of surgery [phone]   Remember: ONLY 1 PERSON MAY GO WITH YOU TO SHORT STAY TO GET  READY MORNING OF [place].  Do not eat food or drink liquids :After Midnight. Contact Dr. Johney Maine office for instructions for any bowel prep-prior to surgery.     Take these medicines the morning of surgery with A SIP OF WATER: None. DO NOT TAKE ANY DIABETIC MEDICATIONS DAY OF YOUR SURGERY                               You may not have any metal on your body including hair pins and              piercings  Do not wear jewelry, make-up, lotions, powders or perfumes, deodorant             Do not wear nail polish.  Do not shave  48 hours prior to surgery.              Men may shave face and neck.   Do not bring valuables to the hospital. [place].  Contacts, dentures or bridgework may not be worn into surgery.  Leave suitcase in the car. After surgery it may be brought to your room.     Patients discharged the day of surgery will not be allowed to drive home.[place] - Preparing for Surgery Before surgery, you can play an important role.  Because skin is not sterile, your skin needs to be as free of germs as possible.  You can reduce the number of germs on your skin by washing with CHG (chlorahexidine gluconate) soap before surgery.  CHG is an antiseptic cleaner which kills germs and bonds with the skin to continue killing germs even after washing. Please DO NOT use if you have an allergy to CHG or antibacterial soaps.  If your skin becomes reddened/irritated stop using the CHG and inform your nurse when you arrive at Short Stay. Do not shave (including legs and underarms) for at least 48 hours prior  to the first CHG shower.  You may shave your face/neck. Please follow these instructions carefully:  1.  Shower with CHG Soap the night before surgery and the  morning of Surgery.  2.  If you choose to wash your hair, wash your hair first as usual with your  normal  shampoo.  3.  After you shampoo, rinse your hair and body thoroughly to remove the  shampoo.                           4.  Use CHG as you would any other liquid soap.  You can apply chg directly  to the skin and wash                       Gently with a scrungie or clean washcloth.  5.  Apply the CHG Soap to  your body ONLY FROM THE NECK DOWN.   Do not use on face/ open                           Wound or open sores. Avoid contact with eyes, ears mouth and genitals (private parts).                       Wash face,  Genitals (private parts) with your normal soap.             6.  Wash thoroughly, paying special attention to the area where your surgery  will be performed.  7.  Thoroughly rinse your body with warm water from the neck down.  8.  DO NOT shower/wash with your normal soap after using and rinsing off  the CHG Soap.                9.  Pat yourself dry with a clean towel.            10.  Wear clean pajamas.            11.  Place clean sheets on your bed the night of your first shower and do not  sleep with pets. Day of Surgery : Do not apply any lotions/deodorants the morning of surgery.  Please wear clean clothes to the hospital/surgery center.  FAILURE TO FOLLOW THESE INSTRUCTIONS MAY RESULT IN THE CANCELLATION OF YOUR SURGERY PATIENT SIGNATURE_________________________________  NURSE SIGNATURE__________________________________  ________________________________________________________________________  WHAT IS A BLOOD TRANSFUSION? Blood Transfusion Information  A transfusion is the replacement of blood or some of its parts. Blood is made up of multiple cells which provide different functions.  Red blood cells carry oxygen  and are used for blood loss replacement.  White blood cells fight against infection.  Platelets control bleeding.  Plasma helps clot blood.  Other blood products are available for specialized needs, such as hemophilia or other clotting disorders. BEFORE THE TRANSFUSION  Who gives blood for transfusions?   Healthy volunteers who are fully evaluated to make sure their blood is safe. This is blood bank blood. Transfusion therapy is the safest it has ever been in the practice of medicine. Before blood is taken from a donor, a complete history is taken to make sure that person has no history of diseases nor engages in risky social behavior (examples are intravenous drug use or sexual activity with multiple partners). The donor's travel history is screened to minimize risk of transmitting infections, such as malaria. The donated blood is tested for signs of infectious diseases, such as HIV and hepatitis. The blood is then tested to be sure it is compatible with you in order to minimize the chance of a transfusion reaction. If you or a relative donates blood, this is often done in anticipation of surgery and is not appropriate for emergency situations. It takes many days to process the donated blood. RISKS AND COMPLICATIONS Although transfusion therapy is very safe and saves many lives, the main dangers of transfusion include:   Getting an infectious disease.  Developing a transfusion reaction. This is an allergic reaction to something in the blood you were given. Every precaution is taken to prevent this. The decision to have a blood transfusion has been considered carefully by your caregiver before blood is given. Blood is not given unless the benefits outweigh the risks. AFTER THE TRANSFUSION  Right after receiving a blood transfusion, you will usually  feel much better and more energetic. This is especially true if your red blood cells have gotten low (anemic). The transfusion raises the level of  the red blood cells which carry oxygen, and this usually causes an energy increase.  The nurse administering the transfusion will monitor you carefully for complications. HOME CARE INSTRUCTIONS  No special instructions are needed after a transfusion. You may find your energy is better. Speak with your caregiver about any limitations on activity for underlying diseases you may have. SEEK MEDICAL CARE IF:   Your condition is not improving after your transfusion.  You develop redness or irritation at the intravenous (IV) site. SEEK IMMEDIATE MEDICAL CARE IF:  Any of the following symptoms occur over the next 12 hours:  Shaking chills.  You have a temperature by mouth above 102 F (38.9 C), not controlled by medicine.  Chest, back, or muscle pain.  People around you feel you are not acting correctly or are confused.  Shortness of breath or difficulty breathing.  Dizziness and fainting.  You get a rash or develop hives.  You have a decrease in urine output.  Your urine turns a dark color or changes to pink, red, or brown. Any of the following symptoms occur over the next 10 days:  You have a temperature by mouth above 102 F (38.9 C), not controlled by medicine.  Shortness of breath.  Weakness after normal activity.  The white part of the eye turns yellow (jaundice).  You have a decrease in the amount of urine or are urinating less often.  Your urine turns a dark color or changes to pink, red, or brown. Document Released: 04/14/2000 Document Revised: 07/10/2011 Document Reviewed: 12/02/2007 ExitCare Patient Information 2014 [employer].  _______________________________________________________________________  Name and phone number of your driver: Alvino Chapel, sister [phone]  Special Instructions: N/A              Please read over the following fact sheets you were given: _____________________________________________________________________

## 2015-06-02 LAB — HEMOGLOBIN A1C
HEMOGLOBIN A1C: 5.6 % (ref 4.8–5.6)
MEAN PLASMA GLUCOSE: 114 mg/dL

## 2015-06-03 MED ORDER — GENTAMICIN SULFATE 40 MG/ML IJ SOLN
INTRAMUSCULAR | Status: DC
Start: 2015-06-03 — End: 2015-06-04
  Filled 2015-06-03: qty 6

## 2015-06-04 ENCOUNTER — Inpatient Hospital Stay (HOSPITAL_COMMUNITY)
Admission: RE | Admit: 2015-06-04 | Discharge: 2015-06-18 | DRG: 981 | Disposition: A | Discharge status: Home / Self Care | Payer: Commercial Managed Care - HMO | Source: Ambulatory Visit | Attending: Surgery | Admitting: Surgery

## 2015-06-04 ENCOUNTER — Inpatient Hospital Stay (HOSPITAL_COMMUNITY): Payer: Commercial Managed Care - HMO | Admitting: Anesthesiology

## 2015-06-04 ENCOUNTER — Encounter (HOSPITAL_COMMUNITY)
Admission: RE | Disposition: A | Discharge status: Home / Self Care | Payer: Self-pay | Source: Ambulatory Visit | Attending: Surgery

## 2015-06-04 ENCOUNTER — Encounter (HOSPITAL_COMMUNITY): Payer: Self-pay | Admitting: *Deleted

## 2015-06-04 DIAGNOSIS — I10 Essential (primary) hypertension: Secondary | ICD-10-CM | POA: Diagnosis present

## 2015-06-04 DIAGNOSIS — K219 Gastro-esophageal reflux disease without esophagitis: Secondary | ICD-10-CM | POA: Diagnosis present

## 2015-06-04 DIAGNOSIS — G894 Chronic pain syndrome: Secondary | ICD-10-CM | POA: Diagnosis present

## 2015-06-04 DIAGNOSIS — Z8673 Personal history of transient ischemic attack (TIA), and cerebral infarction without residual deficits: Secondary | ICD-10-CM

## 2015-06-04 DIAGNOSIS — Z800 Family history of malignant neoplasm of digestive organs: Secondary | ICD-10-CM

## 2015-06-04 DIAGNOSIS — K9189 Other postprocedural complications and disorders of digestive system: Secondary | ICD-10-CM

## 2015-06-04 DIAGNOSIS — Z8371 Family history of colonic polyps: Secondary | ICD-10-CM | POA: Diagnosis not present

## 2015-06-04 DIAGNOSIS — K567 Ileus, unspecified: Secondary | ICD-10-CM | POA: Diagnosis not present

## 2015-06-04 DIAGNOSIS — G4700 Insomnia, unspecified: Secondary | ICD-10-CM | POA: Diagnosis present

## 2015-06-04 DIAGNOSIS — Z7901 Long term (current) use of anticoagulants: Secondary | ICD-10-CM

## 2015-06-04 DIAGNOSIS — Z8249 Family history of ischemic heart disease and other diseases of the circulatory system: Secondary | ICD-10-CM | POA: Diagnosis not present

## 2015-06-04 DIAGNOSIS — K660 Peritoneal adhesions (postprocedural) (postinfection): Secondary | ICD-10-CM | POA: Diagnosis present

## 2015-06-04 DIAGNOSIS — Z96641 Presence of right artificial hip joint: Secondary | ICD-10-CM | POA: Diagnosis present

## 2015-06-04 DIAGNOSIS — Z833 Family history of diabetes mellitus: Secondary | ICD-10-CM

## 2015-06-04 DIAGNOSIS — D509 Iron deficiency anemia, unspecified: Secondary | ICD-10-CM | POA: Diagnosis present

## 2015-06-04 DIAGNOSIS — Z8701 Personal history of pneumonia (recurrent): Secondary | ICD-10-CM

## 2015-06-04 DIAGNOSIS — Z79899 Other long term (current) drug therapy: Secondary | ICD-10-CM | POA: Diagnosis not present

## 2015-06-04 DIAGNOSIS — C49A4 Gastrointestinal stromal tumor of large intestine: Secondary | ICD-10-CM | POA: Diagnosis present

## 2015-06-04 DIAGNOSIS — Z86010 Personal history of colonic polyps: Secondary | ICD-10-CM | POA: Diagnosis not present

## 2015-06-04 DIAGNOSIS — Z01810 Encounter for preprocedural cardiovascular examination: Secondary | ICD-10-CM | POA: Diagnosis not present

## 2015-06-04 DIAGNOSIS — Z8672 Personal history of thrombophlebitis: Secondary | ICD-10-CM | POA: Diagnosis not present

## 2015-06-04 DIAGNOSIS — Z79891 Long term (current) use of opiate analgesic: Secondary | ICD-10-CM

## 2015-06-04 DIAGNOSIS — Z01812 Encounter for preprocedural laboratory examination: Secondary | ICD-10-CM | POA: Diagnosis not present

## 2015-06-04 DIAGNOSIS — E669 Obesity, unspecified: Secondary | ICD-10-CM | POA: Diagnosis present

## 2015-06-04 DIAGNOSIS — N3281 Overactive bladder: Secondary | ICD-10-CM | POA: Diagnosis present

## 2015-06-04 DIAGNOSIS — C49A5 Gastrointestinal stromal tumor of rectum: Secondary | ICD-10-CM | POA: Diagnosis present

## 2015-06-04 DIAGNOSIS — D571 Sickle-cell disease without crisis: Secondary | ICD-10-CM | POA: Diagnosis present

## 2015-06-04 DIAGNOSIS — K449 Diaphragmatic hernia without obstruction or gangrene: Secondary | ICD-10-CM | POA: Diagnosis present

## 2015-06-04 DIAGNOSIS — Z86711 Personal history of pulmonary embolism: Secondary | ICD-10-CM | POA: Diagnosis not present

## 2015-06-04 DIAGNOSIS — E785 Hyperlipidemia, unspecified: Secondary | ICD-10-CM | POA: Diagnosis present

## 2015-06-04 DIAGNOSIS — E8881 Metabolic syndrome: Secondary | ICD-10-CM | POA: Diagnosis present

## 2015-06-04 DIAGNOSIS — Z6837 Body mass index (BMI) 37.0-37.9, adult: Secondary | ICD-10-CM

## 2015-06-04 DIAGNOSIS — Z86718 Personal history of other venous thrombosis and embolism: Secondary | ICD-10-CM

## 2015-06-04 DIAGNOSIS — R110 Nausea: Secondary | ICD-10-CM

## 2015-06-04 DIAGNOSIS — I2699 Other pulmonary embolism without acute cor pulmonale: Secondary | ICD-10-CM | POA: Diagnosis present

## 2015-06-04 HISTORY — PX: LAPAROSCOPIC LYSIS OF ADHESIONS: SHX5905

## 2015-06-04 HISTORY — PX: XI ROBOTIC ASSISTED LOWER ANTERIOR RESECTION: SHX6558

## 2015-06-04 LAB — TYPE AND SCREEN
ABO/RH(D): O POS
Antibody Screen: NEGATIVE

## 2015-06-04 LAB — PROTIME-INR
INR: 1.03 (ref 0.00–1.49)
Prothrombin Time: 13.7 seconds (ref 11.6–15.2)

## 2015-06-04 SURGERY — RESECTION, RECTUM, LOW ANTERIOR, ROBOT-ASSISTED
Anesthesia: General | Site: Abdomen

## 2015-06-04 MED ORDER — PANTOPRAZOLE SODIUM 40 MG PO TBEC
80.0000 mg | DELAYED_RELEASE_TABLET | Freq: Every day | ORAL | Status: DC
  Administered 2015-06-04 – 2015-06-07 (×4): 80 mg via ORAL
  Filled 2015-06-04 (×5): qty 2

## 2015-06-04 MED ORDER — SUGAMMADEX SODIUM 500 MG/5ML IV SOLN
INTRAVENOUS | Status: AC
Start: 2015-06-04 — End: 2015-06-04
  Filled 2015-06-04: qty 5

## 2015-06-04 MED ORDER — DEXAMETHASONE SODIUM PHOSPHATE 10 MG/ML IJ SOLN
INTRAMUSCULAR | Status: DC | PRN
  Administered 2015-06-04: 10 mg via INTRAVENOUS

## 2015-06-04 MED ORDER — METOPROLOL TARTRATE 1 MG/ML IV SOLN
5.0000 mg | Freq: Four times a day (QID) | INTRAVENOUS | Status: DC | PRN
Start: 2015-06-04 — End: 2015-06-18
  Filled 2015-06-04: qty 5

## 2015-06-04 MED ORDER — FENTANYL CITRATE (PF) 250 MCG/5ML IJ SOLN
INTRAMUSCULAR | Status: AC
  Filled 2015-06-04: qty 5

## 2015-06-04 MED ORDER — ALVIMOPAN 12 MG PO CAPS
12.0000 mg | ORAL_CAPSULE | Freq: Two times a day (BID) | ORAL | Status: DC
  Administered 2015-06-05 (×2): 12 mg via ORAL
  Filled 2015-06-04 (×4): qty 1

## 2015-06-04 MED ORDER — ONDANSETRON HCL 4 MG/2ML IJ SOLN
INTRAMUSCULAR | Status: DC | PRN
  Administered 2015-06-04 (×2): 4 mg via INTRAVENOUS

## 2015-06-04 MED ORDER — SODIUM CHLORIDE 0.9 % IJ SOLN
INTRAMUSCULAR | Status: AC
Start: 2015-06-04 — End: 2015-06-04
  Filled 2015-06-04: qty 10

## 2015-06-04 MED ORDER — SUCCINYLCHOLINE CHLORIDE 20 MG/ML IJ SOLN
INTRAMUSCULAR | Status: DC | PRN
  Administered 2015-06-04: 100 mg via INTRAVENOUS

## 2015-06-04 MED ORDER — ZOLPIDEM TARTRATE 5 MG PO TABS: 5.0000 mg | ORAL_TABLET | Freq: Every evening | ORAL | Status: DC | PRN

## 2015-06-04 MED ORDER — MAGIC MOUTHWASH: 15.0000 mL | Freq: Four times a day (QID) | ORAL | Status: DC | PRN

## 2015-06-04 MED ORDER — LIP MEDEX EX OINT
1.0000 "application " | TOPICAL_OINTMENT | Freq: Two times a day (BID) | CUTANEOUS | Status: DC
  Administered 2015-06-05 – 2015-06-18 (×26): 1 via TOPICAL
  Filled 2015-06-04 (×4): qty 7

## 2015-06-04 MED ORDER — CHLORHEXIDINE GLUCONATE CLOTH 2 % EX PADS: 6.0000 | MEDICATED_PAD | Freq: Once | CUTANEOUS | Status: DC

## 2015-06-04 MED ORDER — ROCURONIUM BROMIDE 100 MG/10ML IV SOLN
INTRAVENOUS | Status: AC
  Filled 2015-06-04: qty 1

## 2015-06-04 MED ORDER — ALVIMOPAN 12 MG PO CAPS
12.0000 mg | ORAL_CAPSULE | Freq: Once | ORAL | Status: AC
Start: 2015-06-04 — End: 2015-06-04
  Administered 2015-06-04: 12 mg via ORAL
  Filled 2015-06-04: qty 1

## 2015-06-04 MED ORDER — MIDAZOLAM HCL 2 MG/2ML IJ SOLN
INTRAMUSCULAR | Status: AC
Start: 2015-06-04 — End: 2015-06-04
  Filled 2015-06-04: qty 2

## 2015-06-04 MED ORDER — SODIUM CHLORIDE 0.9 % IV SOLN
INTRAVENOUS | Status: DC | PRN
  Administered 2015-06-04: 11:00:00 via INTRAPERITONEAL

## 2015-06-04 MED ORDER — SODIUM CHLORIDE 0.9 % IV SOLN
INTRAVENOUS | Status: DC
  Administered 2015-06-04 – 2015-06-05 (×2): via INTRAVENOUS

## 2015-06-04 MED ORDER — ONDANSETRON HCL 4 MG/2ML IJ SOLN
4.0000 mg | Freq: Four times a day (QID) | INTRAMUSCULAR | Status: DC | PRN
  Administered 2015-06-04 – 2015-06-17 (×12): 4 mg via INTRAVENOUS
  Filled 2015-06-04 (×13): qty 2

## 2015-06-04 MED ORDER — DEXTROSE 5 % IV SOLN
2.0000 g | INTRAVENOUS | Status: AC
  Administered 2015-06-04 (×2): 2 g via INTRAVENOUS
  Filled 2015-06-04: qty 2

## 2015-06-04 MED ORDER — ASPIRIN EC 81 MG PO TBEC
81.0000 mg | DELAYED_RELEASE_TABLET | Freq: Every day | ORAL | Status: DC
  Administered 2015-06-04 – 2015-06-11 (×7): 81 mg via ORAL
  Filled 2015-06-04 (×9): qty 1

## 2015-06-04 MED ORDER — ACETAMINOPHEN 500 MG PO TABS: 1000.0000 mg | ORAL_TABLET | Freq: Four times a day (QID) | ORAL | Status: DC | PRN

## 2015-06-04 MED ORDER — LACTATED RINGERS IV SOLN
INTRAVENOUS | Status: DC | PRN
  Administered 2015-06-04 (×4): via INTRAVENOUS

## 2015-06-04 MED ORDER — SODIUM CHLORIDE 0.9 % IJ SOLN
INTRAMUSCULAR | Status: AC
  Filled 2015-06-04: qty 100

## 2015-06-04 MED ORDER — BUPIVACAINE-EPINEPHRINE 0.25% -1:200000 IJ SOLN
INTRAMUSCULAR | Status: AC
  Filled 2015-06-04: qty 1

## 2015-06-04 MED ORDER — ALUM & MAG HYDROXIDE-SIMETH 200-200-20 MG/5ML PO SUSP
30.0000 mL | Freq: Four times a day (QID) | ORAL | Status: DC | PRN
  Administered 2015-06-08: 30 mL via ORAL
  Filled 2015-06-04 (×2): qty 30

## 2015-06-04 MED ORDER — PROMETHAZINE HCL 25 MG/ML IJ SOLN: 6.2500 mg | INTRAMUSCULAR | Status: DC | PRN

## 2015-06-04 MED ORDER — GLYCOPYRROLATE 0.2 MG/ML IJ SOLN
INTRAMUSCULAR | Status: AC
Start: 2015-06-04 — End: 2015-06-04
  Filled 2015-06-04: qty 1

## 2015-06-04 MED ORDER — PROMETHAZINE HCL 25 MG/ML IJ SOLN
6.2500 mg | INTRAMUSCULAR | Status: DC | PRN
  Administered 2015-06-04: 12.5 mg via INTRAVENOUS
  Administered 2015-06-09 – 2015-06-10 (×2): 6.25 mg via INTRAVENOUS
  Administered 2015-06-10: 12.5 mg via INTRAVENOUS
  Administered 2015-06-10: 6.25 mg via INTRAVENOUS
  Administered 2015-06-11 – 2015-06-17 (×5): 12.5 mg via INTRAVENOUS
  Filled 2015-06-04 (×12): qty 1

## 2015-06-04 MED ORDER — ACETAMINOPHEN 500 MG PO TABS
1000.0000 mg | ORAL_TABLET | Freq: Three times a day (TID) | ORAL | Status: DC
  Administered 2015-06-05 – 2015-06-08 (×9): 1000 mg via ORAL
  Filled 2015-06-04 (×15): qty 2

## 2015-06-04 MED ORDER — VITAMIN C 500 MG PO TABS
500.0000 mg | ORAL_TABLET | Freq: Two times a day (BID) | ORAL | Status: DC
  Administered 2015-06-04 – 2015-06-08 (×9): 500 mg via ORAL
  Filled 2015-06-04 (×11): qty 1

## 2015-06-04 MED ORDER — ONDANSETRON HCL 4 MG PO TABS
4.0000 mg | ORAL_TABLET | Freq: Four times a day (QID) | ORAL | Status: DC | PRN
  Administered 2015-06-08 (×3): 4 mg via ORAL
  Filled 2015-06-04 (×4): qty 1

## 2015-06-04 MED ORDER — HYDROMORPHONE HCL 2 MG/ML IJ SOLN
INTRAMUSCULAR | Status: AC
  Filled 2015-06-04: qty 1

## 2015-06-04 MED ORDER — EPHEDRINE SULFATE 50 MG/ML IJ SOLN
INTRAMUSCULAR | Status: DC | PRN
  Administered 2015-06-04 (×2): 10 mg via INTRAVENOUS

## 2015-06-04 MED ORDER — SUGAMMADEX SODIUM 500 MG/5ML IV SOLN
INTRAVENOUS | Status: DC | PRN
  Administered 2015-06-04: 300 mg via INTRAVENOUS

## 2015-06-04 MED ORDER — LACTATED RINGERS IV BOLUS (SEPSIS): 1000.0000 mL | Freq: Three times a day (TID) | INTRAVENOUS | Status: AC | PRN

## 2015-06-04 MED ORDER — LIDOCAINE HCL (CARDIAC) 20 MG/ML IV SOLN
INTRAVENOUS | Status: AC
  Filled 2015-06-04: qty 5

## 2015-06-04 MED ORDER — FENTANYL CITRATE (PF) 100 MCG/2ML IJ SOLN
INTRAMUSCULAR | Status: DC | PRN
  Administered 2015-06-04 (×4): 50 ug via INTRAVENOUS
  Administered 2015-06-04: 100 ug via INTRAVENOUS
  Administered 2015-06-04: 50 ug via INTRAVENOUS

## 2015-06-04 MED ORDER — DIPHENHYDRAMINE HCL 12.5 MG/5ML PO ELIX: 12.5000 mg | ORAL_SOLUTION | Freq: Four times a day (QID) | ORAL | Status: DC | PRN

## 2015-06-04 MED ORDER — SODIUM CHLORIDE 0.9 % IJ SOLN
INTRAMUSCULAR | Status: AC
  Filled 2015-06-04: qty 10

## 2015-06-04 MED ORDER — EPHEDRINE SULFATE 50 MG/ML IJ SOLN
INTRAMUSCULAR | Status: AC
  Filled 2015-06-04: qty 1

## 2015-06-04 MED ORDER — LACTATED RINGERS IV SOLN
INTRAVENOUS | Status: DC | PRN
  Administered 2015-06-04: 07:00:00 via INTRAVENOUS

## 2015-06-04 MED ORDER — PHENYLEPHRINE-DM-GG-APAP 5-10-200-325 MG/10ML PO LIQD: Freq: Two times a day (BID) | ORAL | Status: DC | PRN

## 2015-06-04 MED ORDER — FENTANYL CITRATE (PF) 100 MCG/2ML IJ SOLN: 25.0000 ug | INTRAMUSCULAR | Status: DC | PRN

## 2015-06-04 MED ORDER — MENTHOL 3 MG MT LOZG: 1.0000 | LOZENGE | OROMUCOSAL | Status: DC | PRN

## 2015-06-04 MED ORDER — DEXAMETHASONE SODIUM PHOSPHATE 10 MG/ML IJ SOLN
INTRAMUSCULAR | Status: AC
  Filled 2015-06-04: qty 1

## 2015-06-04 MED ORDER — PROCHLORPERAZINE MALEATE 10 MG PO TABS
5.0000 mg | ORAL_TABLET | Freq: Four times a day (QID) | ORAL | Status: DC | PRN
  Administered 2015-06-09: 5 mg via ORAL
  Filled 2015-06-04: qty 1

## 2015-06-04 MED ORDER — DIPHENHYDRAMINE HCL 50 MG/ML IJ SOLN: 12.5000 mg | Freq: Four times a day (QID) | INTRAMUSCULAR | Status: DC | PRN

## 2015-06-04 MED ORDER — PROPOFOL 10 MG/ML IV BOLUS
INTRAVENOUS | Status: AC
  Filled 2015-06-04: qty 20

## 2015-06-04 MED ORDER — ACETAMINOPHEN 10 MG/ML IV SOLN
1000.0000 mg | Freq: Once | INTRAVENOUS | Status: AC
  Administered 2015-06-04: 1000 mg via INTRAVENOUS

## 2015-06-04 MED ORDER — LACTATED RINGERS IR SOLN
Status: DC | PRN
  Administered 2015-06-04: 1000 mL

## 2015-06-04 MED ORDER — DEXTROSE 5 % IV SOLN
2.0000 g | Freq: Two times a day (BID) | INTRAVENOUS | Status: AC
  Administered 2015-06-05: 2 g via INTRAVENOUS
  Filled 2015-06-04: qty 2

## 2015-06-04 MED ORDER — ESTRADIOL 0.05 MG/24HR TD PTWK
0.0500 mg | MEDICATED_PATCH | TRANSDERMAL | Status: DC
  Administered 2015-06-09 – 2015-06-16 (×2): 0.05 mg via TRANSDERMAL
  Filled 2015-06-04 (×2): qty 1

## 2015-06-04 MED ORDER — PHENOL 1.4 % MT LIQD
2.0000 | OROMUCOSAL | Status: DC | PRN
  Administered 2015-06-11: 2 via OROMUCOSAL
  Filled 2015-06-04: qty 177

## 2015-06-04 MED ORDER — 0.9 % SODIUM CHLORIDE (POUR BTL) OPTIME
TOPICAL | Status: DC | PRN
  Administered 2015-06-04: 3000 mL

## 2015-06-04 MED ORDER — ONDANSETRON HCL 4 MG/2ML IJ SOLN
INTRAMUSCULAR | Status: AC
  Filled 2015-06-04: qty 2

## 2015-06-04 MED ORDER — FENTANYL CITRATE (PF) 100 MCG/2ML IJ SOLN
INTRAMUSCULAR | Status: AC
  Filled 2015-06-04: qty 2

## 2015-06-04 MED ORDER — MEPERIDINE HCL 50 MG/ML IJ SOLN: 6.2500 mg | INTRAMUSCULAR | Status: DC | PRN

## 2015-06-04 MED ORDER — CEFOTETAN DISODIUM-DEXTROSE 2-2.08 GM-% IV SOLR
INTRAVENOUS | Status: AC
  Filled 2015-06-04: qty 50

## 2015-06-04 MED ORDER — BUPIVACAINE-EPINEPHRINE 0.25% -1:200000 IJ SOLN
INTRAMUSCULAR | Status: DC | PRN
  Administered 2015-06-04: 50 mL

## 2015-06-04 MED ORDER — ENOXAPARIN SODIUM 40 MG/0.4ML ~~LOC~~ SOLN
40.0000 mg | SUBCUTANEOUS | Status: AC
  Administered 2015-06-05 – 2015-06-18 (×14): 40 mg via SUBCUTANEOUS
  Filled 2015-06-04 (×16): qty 0.4

## 2015-06-04 MED ORDER — SODIUM CHLORIDE 0.9 % IJ SOLN
INTRAMUSCULAR | Status: DC | PRN
  Administered 2015-06-04: 80 mL

## 2015-06-04 MED ORDER — BUPIVACAINE LIPOSOME 1.3 % IJ SUSP
20.0000 mL | Freq: Once | INTRAMUSCULAR | Status: AC
  Administered 2015-06-04: 80 mL
  Filled 2015-06-04: qty 20

## 2015-06-04 MED ORDER — PROPOFOL 10 MG/ML IV BOLUS
INTRAVENOUS | Status: DC | PRN
  Administered 2015-06-04: 200 mg via INTRAVENOUS

## 2015-06-04 MED ORDER — ACETAMINOPHEN 10 MG/ML IV SOLN
INTRAVENOUS | Status: AC
  Filled 2015-06-04: qty 100

## 2015-06-04 MED ORDER — HYDROMORPHONE HCL 1 MG/ML IJ SOLN
0.5000 mg | INTRAMUSCULAR | Status: DC | PRN
  Administered 2015-06-04: 2 mg via INTRAVENOUS
  Administered 2015-06-05 (×2): 1 mg via INTRAVENOUS
  Administered 2015-06-05: 2 mg via INTRAVENOUS
  Administered 2015-06-05: 1 mg via INTRAVENOUS
  Administered 2015-06-05: 2 mg via INTRAVENOUS
  Administered 2015-06-06 (×2): 1 mg via INTRAVENOUS
  Filled 2015-06-04: qty 1
  Filled 2015-06-04 (×2): qty 2
  Filled 2015-06-04 (×2): qty 1
  Filled 2015-06-04: qty 2
  Filled 2015-06-04 (×2): qty 1

## 2015-06-04 MED ORDER — ENSURE ENLIVE PO LIQD
237.0000 mL | Freq: Two times a day (BID) | ORAL | Status: DC
Start: 2015-06-05 — End: 2015-06-13
  Administered 2015-06-06 – 2015-06-07 (×4): 237 mL via ORAL

## 2015-06-04 MED ORDER — ROCURONIUM BROMIDE 100 MG/10ML IV SOLN
INTRAVENOUS | Status: DC | PRN
  Administered 2015-06-04: 5 mg via INTRAVENOUS
  Administered 2015-06-04 (×2): 20 mg via INTRAVENOUS
  Administered 2015-06-04: 5 mg via INTRAVENOUS
  Administered 2015-06-04: 50 mg via INTRAVENOUS
  Administered 2015-06-04: 5 mg via INTRAVENOUS

## 2015-06-04 MED ORDER — PRAVASTATIN SODIUM 20 MG PO TABS
20.0000 mg | ORAL_TABLET | Freq: Every day | ORAL | Status: DC
  Administered 2015-06-04 – 2015-06-11 (×7): 20 mg via ORAL
  Filled 2015-06-04 (×9): qty 1

## 2015-06-04 MED ORDER — LIDOCAINE HCL (CARDIAC) 20 MG/ML IV SOLN
INTRAVENOUS | Status: DC | PRN
  Administered 2015-06-04: 100 mg via INTRAVENOUS

## 2015-06-04 MED ORDER — HYDROMORPHONE HCL 1 MG/ML IJ SOLN
INTRAMUSCULAR | Status: DC | PRN
  Administered 2015-06-04 (×2): .4 mg via INTRAVENOUS
  Administered 2015-06-04 (×2): 1 mg via INTRAVENOUS
  Administered 2015-06-04 (×2): .4 mg via INTRAVENOUS

## 2015-06-04 MED ORDER — SACCHAROMYCES BOULARDII 250 MG PO CAPS
250.0000 mg | ORAL_CAPSULE | Freq: Two times a day (BID) | ORAL | Status: DC
  Administered 2015-06-04 – 2015-06-07 (×7): 250 mg via ORAL
  Filled 2015-06-04 (×10): qty 1

## 2015-06-04 MED ORDER — MIDAZOLAM HCL 5 MG/5ML IJ SOLN
INTRAMUSCULAR | Status: DC | PRN
  Administered 2015-06-04: 2 mg via INTRAVENOUS

## 2015-06-04 SURGICAL SUPPLY — 119 items
APPLIER CLIP 5 13 M/L LIGAMAX5 (MISCELLANEOUS)
APPLIER CLIP ROT 10 11.4 M/L (STAPLE)
APR CLP MED LRG 11.4X10 (STAPLE)
APR CLP MED LRG 5 ANG JAW (MISCELLANEOUS)
BLADE EXTENDED COATED 6.5IN (ELECTRODE) IMPLANT
BLADE SURG SZ11 CARB STEEL (BLADE) ×3 IMPLANT
CABLE HIGH FREQUENCY MONO STRZ (ELECTRODE) IMPLANT
CANNULA REDUC XI 12-8 STAPL (CANNULA) ×1
CANNULA REDUCER 12-8 DVNC XI (CANNULA) ×2 IMPLANT
CATH KIT ON-Q SILVERSOAK 7.5 (CATHETERS) IMPLANT
CATH KIT ON-Q SILVERSOAK 7.5IN (CATHETERS) IMPLANT
CATH ROBINSON RED A/P 16FR (CATHETERS) ×1 IMPLANT
CELLS DAT CNTRL 66122 CELL SVR (MISCELLANEOUS) IMPLANT
CHLORAPREP W/TINT 26ML (MISCELLANEOUS) ×3 IMPLANT
CLIP APPLIE 5 13 M/L LIGAMAX5 (MISCELLANEOUS) IMPLANT
CLIP APPLIE ROT 10 11.4 M/L (STAPLE) IMPLANT
CLIP LIGATING HEM O LOK PURPLE (MISCELLANEOUS) IMPLANT
CLIP LIGATING HEMO O LOK GREEN (MISCELLANEOUS) IMPLANT
COUNTER NEEDLE 20 DBL MAG RED (NEEDLE) ×3 IMPLANT
COVER MAYO STAND STRL (DRAPES) ×6 IMPLANT
COVER SURGICAL LIGHT HANDLE (MISCELLANEOUS) ×2 IMPLANT
COVER TIP SHEARS 8 DVNC (MISCELLANEOUS) ×2 IMPLANT
COVER TIP SHEARS 8MM DA VINCI (MISCELLANEOUS) ×1
DECANTER SPIKE VIAL GLASS SM (MISCELLANEOUS) ×6 IMPLANT
DEVICE TROCAR PUNCTURE CLOSURE (ENDOMECHANICALS) ×2 IMPLANT
DRAIN CHANNEL 19F RND (DRAIN) IMPLANT
DRAPE ARM DVNC X/XI (DISPOSABLE) ×8 IMPLANT
DRAPE COLUMN DVNC XI (DISPOSABLE) ×2 IMPLANT
DRAPE DA VINCI XI ARM (DISPOSABLE) ×4
DRAPE DA VINCI XI COLUMN (DISPOSABLE) ×1
DRAPE SURG IRRIG POUCH 19X23 (DRAPES) ×3 IMPLANT
DRAPE WARM FLUID 44X44 (DRAPE) IMPLANT
DRSG OPSITE POSTOP 4X10 (GAUZE/BANDAGES/DRESSINGS) IMPLANT
DRSG OPSITE POSTOP 4X6 (GAUZE/BANDAGES/DRESSINGS) IMPLANT
DRSG OPSITE POSTOP 4X8 (GAUZE/BANDAGES/DRESSINGS) IMPLANT
DRSG TEGADERM 2-3/8X2-3/4 SM (GAUZE/BANDAGES/DRESSINGS) ×13 IMPLANT
DRSG TEGADERM 4X4.75 (GAUZE/BANDAGES/DRESSINGS) ×1 IMPLANT
ELECT PENCIL ROCKER SW 15FT (MISCELLANEOUS) ×6 IMPLANT
ELECT REM PT RETURN 9FT ADLT (ELECTROSURGICAL) ×3
ELECTRODE REM PT RTRN 9FT ADLT (ELECTROSURGICAL) ×2 IMPLANT
ENDOLOOP SUT PDS II  0 18 (SUTURE) ×1
ENDOLOOP SUT PDS II 0 18 (SUTURE) IMPLANT
EVACUATOR SILICONE 100CC (DRAIN) ×1 IMPLANT
GAUZE SPONGE 2X2 8PLY STRL LF (GAUZE/BANDAGES/DRESSINGS) ×2 IMPLANT
GAUZE SPONGE 4X4 12PLY STRL (GAUZE/BANDAGES/DRESSINGS) IMPLANT
GLOVE ECLIPSE 8.0 STRL XLNG CF (GLOVE) ×9 IMPLANT
GLOVE INDICATOR 8.0 STRL GRN (GLOVE) ×9 IMPLANT
GOWN STRL REUS W/TWL XL LVL3 (GOWN DISPOSABLE) ×12 IMPLANT
KIT PROCEDURE DA VINCI SI (MISCELLANEOUS)
KIT PROCEDURE DVNC SI (MISCELLANEOUS) ×2 IMPLANT
LEGGING LITHOTOMY PAIR STRL (DRAPES) ×3 IMPLANT
LUBRICANT JELLY K Y 4OZ (MISCELLANEOUS) ×1 IMPLANT
MARKER SKIN DUAL TIP RULER LAB (MISCELLANEOUS) ×3 IMPLANT
NDL INSUFFLATION 14GA 120MM (NEEDLE) ×2 IMPLANT
NEEDLE INSUFFLATION 14GA 120MM (NEEDLE) ×3 IMPLANT
PACK CARDIOVASCULAR III (CUSTOM PROCEDURE TRAY) ×3 IMPLANT
PACK COLON (CUSTOM PROCEDURE TRAY) ×3 IMPLANT
PORT LAP GEL ALEXIS MED 5-9CM (MISCELLANEOUS) ×1 IMPLANT
RELOAD STAPLE 45 BLU REG DVNC (STAPLE) IMPLANT
RELOAD STAPLE 45 GRN THCK DVNC (STAPLE) IMPLANT
RETRACTOR LONE STAR DISPOSABLE (INSTRUMENTS) ×1 IMPLANT
RETRACTOR STAY HOOK 5MM (MISCELLANEOUS) ×1 IMPLANT
RETRACTOR WND ALEXIS 18 MED (MISCELLANEOUS) IMPLANT
RTRCTR WOUND ALEXIS 18CM MED (MISCELLANEOUS)
SCISSORS LAP 5X35 DISP (ENDOMECHANICALS) ×1 IMPLANT
SCRUB PCMX 4 OZ (MISCELLANEOUS) ×3 IMPLANT
SEAL CANN UNIV 5-8 DVNC XI (MISCELLANEOUS) ×6 IMPLANT
SEAL XI 5MM-8MM UNIVERSAL (MISCELLANEOUS) ×3
SEALER VESSEL DA VINCI XI (MISCELLANEOUS)
SEALER VESSEL EXT DVNC XI (MISCELLANEOUS) IMPLANT
SET BI-LUMEN FLTR TB AIRSEAL (TUBING) ×3 IMPLANT
SET TUBE IRRIG SUCTION NO TIP (IRRIGATION / IRRIGATOR) ×3 IMPLANT
SLEEVE XCEL OPT CAN 5 100 (ENDOMECHANICALS) IMPLANT
SOLUTION ELECTROLUBE (MISCELLANEOUS) ×3 IMPLANT
SPONGE GAUZE 2X2 STER 10/PKG (GAUZE/BANDAGES/DRESSINGS) ×1
STAPLER 45 BLU RELOAD XI (STAPLE) IMPLANT
STAPLER 45 BLUE RELOAD XI (STAPLE)
STAPLER 45 GREEN RELOAD XI (STAPLE)
STAPLER 45 GRN RELOAD XI (STAPLE) IMPLANT
STAPLER CANNULA SEAL DVNC XI (STAPLE) ×2 IMPLANT
STAPLER CANNULA SEAL XI (STAPLE) ×1
STAPLER GUN LINEAR PROX 60 (STAPLE) ×1 IMPLANT
STAPLER SHEATH (SHEATH)
STAPLER SHEATH ENDOWRIST DVNC (SHEATH) IMPLANT
SUT MNCRL AB 4-0 PS2 18 (SUTURE) ×3 IMPLANT
SUT PDS AB 1 CTX 36 (SUTURE) ×7 IMPLANT
SUT PDS AB 1 TP1 96 (SUTURE) IMPLANT
SUT PDS AB 2-0 CT2 27 (SUTURE) IMPLANT
SUT PROLENE 0 CT 2 (SUTURE) ×3 IMPLANT
SUT PROLENE 2 0 SH DA (SUTURE) ×1 IMPLANT
SUT SILK 2 0 (SUTURE) ×6
SUT SILK 2 0 SH (SUTURE) ×2 IMPLANT
SUT SILK 2 0 SH CR/8 (SUTURE) ×3 IMPLANT
SUT SILK 2-0 18XBRD TIE 12 (SUTURE) ×2 IMPLANT
SUT SILK 3 0 (SUTURE) ×3
SUT SILK 3 0 SH CR/8 (SUTURE) ×3 IMPLANT
SUT SILK 3-0 18XBRD TIE 12 (SUTURE) ×2 IMPLANT
SUT V-LOC BARB 180 2/0GR6 GS22 (SUTURE)
SUT VIC AB 2-0 SH 18 (SUTURE) ×8 IMPLANT
SUT VIC AB 3-0 SH 18 (SUTURE) IMPLANT
SUT VIC AB 3-0 SH 27 (SUTURE)
SUT VIC AB 3-0 SH 27XBRD (SUTURE) IMPLANT
SUT VICRYL 0 UR6 27IN ABS (SUTURE) ×3 IMPLANT
SUT VLOC 180 2-0 9IN GS21 (SUTURE) IMPLANT
SUT VLOC BARB 180 ABS3/0GR12 (SUTURE) ×3
SUTURE V-LC BRB 180 2/0GR6GS22 (SUTURE) IMPLANT
SUTURE VLOC BRB 180 ABS3/0GR12 (SUTURE) IMPLANT
SYRINGE 10CC LL (SYRINGE) ×3 IMPLANT
SYS LAPSCP GELPORT 120MM (MISCELLANEOUS)
SYSTEM LAPSCP GELPORT 120MM (MISCELLANEOUS) IMPLANT
TAPE UMBILICAL COTTON 1/8X30 (MISCELLANEOUS) ×3 IMPLANT
TOWEL OR 17X26 10 PK STRL BLUE (TOWEL DISPOSABLE) ×3 IMPLANT
TOWEL OR NON WOVEN STRL DISP B (DISPOSABLE) IMPLANT
TRAY FOLEY W/METER SILVER 14FR (SET/KITS/TRAYS/PACK) ×3 IMPLANT
TRAY FOLEY W/METER SILVER 16FR (SET/KITS/TRAYS/PACK) ×2 IMPLANT
TROCAR BLADELESS OPT 5 100 (ENDOMECHANICALS) ×3 IMPLANT
TROCAR BLADELESS OPT 5 150 (ENDOMECHANICALS) ×1 IMPLANT
TUBING CONNECTING 10 (TUBING) IMPLANT
TUNNELER SHEATH ON-Q 16GX12 DP (PAIN MANAGEMENT) IMPLANT

## 2015-06-04 NOTE — Transfer of Care (Signed)
Immediate Anesthesia Transfer of Care Note  Patient: Brianna Walker  Procedure(s) Performed: Procedure(s): XI ROBOTIC ASSISTED LYSIS OF ADHESIONS, LOWER ANTERIOR RESECTION TRANS ABDOMINAL AND TRANS ANAL, COLOANAL HANDSEWN ANASTAOSIS, DIVERTING LOPE ILIOSTOMY, RESECTION GIST TUMOR (N/A) LAPAROSCOPIC LYSIS OF ADHESIONS  Patient Location: PACU  Anesthesia Type:General  Level of Consciousness: sedated  Airway & Oxygen Therapy: Patient Spontanous Breathing and Patient connected to face mask oxygen  Post-op Assessment: Report given to RN and Post -op Vital signs reviewed and stable  Post vital signs: Reviewed and stable  Last Vitals:  Filed Vitals:   06/04/15 0555  BP: 152/79  Pulse: 84  Temp: 36.6 C  Resp: 16    Complications: No apparent anesthesia complications

## 2015-06-04 NOTE — Anesthesia Procedure Notes (Signed)
Procedure Name: Intubation Date/Time: 06/04/2015 7:37 AM Performed by: Lind Covert Pre-anesthesia Checklist: Patient identified, Timeout performed, Emergency Drugs available, Suction available and Patient being monitored Patient Re-evaluated:Patient Re-evaluated prior to inductionOxygen Delivery Method: Circle system utilized Preoxygenation: Pre-oxygenation with 100% oxygen Intubation Type: IV induction Laryngoscope Size: Mac and 4 Grade View: Grade I Tube type: Oral Tube size: 7.5 mm Number of attempts: 1 Airway Equipment and Method: Stylet Placement Confirmation: ETT inserted through vocal cords under direct vision,  positive ETCO2 and breath sounds checked- equal and bilateral Secured at: 22 cm Tube secured with: Tape Dental Injury: Teeth and Oropharynx as per pre-operative assessment

## 2015-06-04 NOTE — Op Note (Addendum)
06/04/2015  3:43 PM  PATIENT:  Brianna Walker  70 y.o. 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 Resection of cystic mass near small bowel with serosal repair.  Surgeon(s): Michael Boston, MD Leighton Ruff, MD - Assist  ANESTHESIA:   local and general  EBL:  Total I/O In: 76 [I.V.:3000] Out: 57 [Urine:500; Blood:150]  Delay start of Pharmacological VTE agent (>24hrs) due to surgical blood loss or risk of bleeding:  no  DRAINS: Penrose drain in the PELVIS   SPECIMEN:  Source of Specimen:  PELVIC MASS c/w GIST with rectosigmoid   DISPOSITION OF SPECIMEN:  PATHOLOGY  COUNTS:  YES  PLAN OF CARE: Admit to inpatient   PATIENT DISPOSITION:  PACU - hemodynamically stable.  INDICATION:    Patient with large pelvic mass in low pelvis.  Biopsy consistent with gastrointestinal stromal tumor.  Probable rectal origin.  Did not leave back to help shrink the mass down.  However having issues of pressure and difficulty with bowel movements.  I recommended segmental resection:  The anatomy & physiology of the digestive tract was discussed.  The pathophysiology was discussed.  Natural history risks without surgery was discussed.   I worked to give an overview of the disease and the frequent need to have multispecialty involvement.  I feel the risks of no intervention will lead to serious problems that outweigh the operative risks; therefore, I recommended a partial colectomy to remove the pathology.  Laparoscopic & open techniques were discussed.   Risks such as bleeding, infection, abscess, leak, reoperation, possible ostomy,  hernia, heart attack, death, and other risks were discussed.  I noted a good likelihood this will help address the problem.   Goals of post-operative recovery were discussed as well.  We will work to minimize complications.  Educational materials on the pathology had been given in the office.  Questions were answered.    The patient expressed understanding & wished to proceed with surgery.  OR FINDINGS:   Patient had rubbery hard bulky tumor in the lower pelvis, coming off the distal left anterior rectal wall pushing all the organs away.  No obvious metastatic disease on visceral parietal peritoneum or liver.  Very dense intra-abdominal adhesions.  3 x 2 cm cystic mass in the anterior abdominal wall with mid ileum adherent to it.  Uncertain of this was a diverticulum fistula or separate cyst.  Excised with serosal repair done.  Part of the sigmoid colon entire rectum are removed.  It is a handsewn side colon onto end anal canal anastomosis.  The anastomosis rests 1-2 cm from the anal verge  DESCRIPTION:   Informed consent was confirmed.  The patient underwent general anaesthesia without difficulty.  The patient was positioned appropriately.  VTE prevention in place.  The patient's abdomen was clipped, prepped, & draped in a sterile fashion.  Surgical timeout confirmed our plan.  The patient was positioned in reverse Trendelenburg.  Abdominal entry was gained using Varess technique with a trach hook on the anterior abdominal wall fascia.  Could not get good insufflation so placed in the left upper quadrant.  I placed a 52mm port in the left upper  abdomen.  Entry was below the greater omentum.  Had quite dense adhesions.  I redirected the camera between the greater omentum and anterior abdominal wall in the right upper quadrant.  I create enough working space to get a final port in the right upper abdomen 5 [place] in the left upper abdomen.  Did sharp and blunt dissection and eventually freed  adhesions of the entire anterior abdominal wall.  This took quite some time, over 2 hours.  There was a segment in the central abdomen of ileum stuck to the anterior abdominal wall.  I noted a cyst with tan drainage.  Initially concerned about serosal injury but dissected off the small bowel cannot find any definite connection to the small bowel.  Nonetheless did a surface appeared near there with a 30 serrated absorbable V lock suture.   Extra ports were carefully placed under direct laparoscopic visualization.  Her abdomen had greater omentum stuck to most the small bowel.  We did free some of that off to get some mobility.  The patient was carefully positioned.  The Intuitive daVinci robot was carefully docked with camera & instruments carefully placed.  The patient had no obvious pelvic mass.  I went through the anterior peritoneal reflection inferiorly the rectum off the anterior pelvis and rectovaginal septum.  We able to confirm that we are on the vaginal cuff with digital transvaginal manipulation.  Became per we are coming to a bulky tumor that was densely adherent to the anterior rectal wall.  Pushing through the anterior muscle fibers of the rectum.  Consistent with a gastrointestinal stromal tumor of anterior rectal wall origin.  It appeared to be quite distal.   I therefore began to free this mass off the rectovaginal septum and anterior pelvis.  I came around laterally.  This took some time.  Became obvious that it was densely adherent to the rectum.  I therefore proceeded with low anterior resection since we both agree it was not likely that this was going to come off the rectum.  The rubbery firm mass was bulky pushing the rectum to the far right side.  Wrapped around it.  I scored the base of peritoneum of the medial side of the mesentery of the left colon from the ligament of Treitz to the peritoneal reflection of the mid rectum.   I elevated the sigmoid mesentery and entered into the  retro-mesenteric plane. We were able to identify the left ureter and gonadal vessels. We kept those posterior within the retroperitoneum and elevated the left colon mesentery off that. I did isolate the inferior mesenteric artery (IMA) pedicle but did not ligate it yet.  I continued distally and got into the avascular plane posterior to the mesorectum. This allowed me to help mobilize the rectum as well by freeing the mesorectum off the sacrum.  I mobilized the peritoneal coverings towards the peritoneal reflection on both the right and left sides of the rectum.  I stayed away from the right and left ureters.  I kept the lateral vascular pedicles to the rectum intact.  I skeletonized the lymph nodes off the inferior mesenteric artery pedicle.  I went down to its takeoff from the aorta.  I isolated the inferior mesenteric vein off of the ligament of Treitz just cephalad to that as well.  After confirming the left ureter was out of the way, I went ahead and ligated the inferior mesenteric artery pedicle just near its takeoff from the aorta.  I did ligate the  inferior mesenteric vein in a similar fashion.  We ensured hemostasis. I skeletonized the mesorectum at the junction at the proximal rectum for the distal point of resection.  I mobilized the left colon in a lateral to medial fashion off the line of [place]t up towards the splenic flexure to ensure good mobilization of the remaining left colon to reach into the pelvis.  Had a very redundant sigmoid colon.  Somewhat corkscrew but got it to straighten out.  Therefore proceed with mesorectum excision.  Mobilized the rectum off the presacral space all the way down to the pelvic floor.  Came around the pedicles laterally as well.  With this I was able to help better bring out the pelvic mass.  Although was very adherent to the distal rectovaginal septum as well.  By digital exam we were quite distal on our dissection..    I did further lysed adhesions between the  omentum and ileum.  Found an area of distal ileum that had a nice long stretched out mesentery.  Placed a red rubber catheter through mesenteric defect near there and tied that with a 0 PDS Endoloop.  This would be our planned diverting loop ileostomy.  Placed a clamp on that.  Inspection irrigation the abdomen.  Saw no evidence of abnormality or tissues needing repair.  I transitioned into transanal resection.  I rescrubbed.  Patient positioned in high lithotomy.  I placed a bilateral anorectal field block using liposomal bupivacaine.  I placed a [place] retractor to help retract the anal canal.  Her sphincters are to quite dilated and she had chronically prolapsed hemorrhoids.  I entered the pelvis through the posterior rectal wall about 2 cm proximal to the anal verge. Came around laterally.  Anteriorly the mass was pushing on the sphincters & the posterior vaginal wall to the level of the introitus.  Eventually freed adhesions off those areas with focused cautery and blunt dissection.  With that,  I could eviscerate the mass out and freed off.  Did take some posterior rectovaginal wall with that.  Once are moved out of the body, the mass actually peeled off the rectum.  The anterior rectal wall distally was quite thinned out with adhesions consistent with the origin of the large gastrointestinal stromal tumor.  I transected the mid sigmoid and it eviscerated about 7 cm from the anal verge with a TX 60 stapler. I oversewed the end colon staple line with mesentery using interrupted Vicryl suture.  I repaired the posterior vaginal wall and rectovaginal septum with interrupted 2-0 vicryl sutures vertically to avoid an iatrogenic rectocele.    I proceeded with handsewn anastomosis.  I placed 2-0 vicryl at the 4 corners anterolaterally and posterolaterally through the anal canal cuff and sphincters.  Connected that to the anterior colonic wall 6 cm proximal to the end staple line.  I returned the colon back into  the pelvis.  I tied the stitches down.  I then opened the anterior colon wall.  Mucosa pink and bleeding.  I placed a drain up with the help of a long laparoscopic grasper that easily passed from the anus into the peritoneal cavity.  I then completed the anastomosis with interrupted Vicryl suture on the 4 sides to good result.  This provided a nice anterior rectal: Wall side to end anal canal anastomosis.  Like a J-pouch but without creating the pouch itself.  .  This provided a wide open side colon to end anorectal handsewn anastomosis.  I inspected  and noted a good watertight seal.  No necrosis.  Above the sphincters just barely.   We did laparoscopic inspection.  Brought the drain out the left upper quadrant robotic port site.  Did copious irrigation with antibiotic solution.  There was no tension of mesentery or bowel at the anastomosis.   Tissues looked viable.    I made a 4 x 3 cm circular defect in the right paramedian abdomen at the premarked location from her panniculus.  Excised a cylinder of fat since she was morbidly obese with a panniculus.  Came down to thinned out fascia.  I was able to breech into the abdominal wall lateral to her midline mesh.  I left her large chronic midline subcutaneous seroma alone.  We brought up a loop of ileum.  It came out several inches without difficulty.   I closed the 36mm port sites using Monocryl stitch and sterile dressing.    Placed a Prolene suture around the drain.  Sterile dressings applied.  I placed a sterile dressing.    I matured the loop ileostomy using interrupted 2-0 Vicryl sutures.  I did place sutures in the corners to help tighten the wound down since her ileum was actually quite narrow.  I secured the double barrel at 4 corners.  Then proceeded to secure because it to the skin with interrupted sutures.  The proximal limb is superior at 12:00.  The distal limb was somewhat ischemic but intact at 6:00.  Finger intubated both well.  Ostomy appliance  placed.  Patient is being extubated go to recovery room. I had discussed postop care with the patient in detail the office & in the holding area. Instructions are written. I'm about to locate family and discuss it with them as well.  Adin [name], M.D., F.A.C.S. Gastrointestinal and Minimally Invasive Surgery [facility] [address], [place], [place] [address] [phone] Main / Paging

## 2015-06-04 NOTE — Anesthesia Postprocedure Evaluation (Signed)
Anesthesia Post Note  Patient: [place]  Procedure(s) Performed: Procedure(s) (LRB): XI ROBOTIC ASSISTED LYSIS OF ADHESIONS, LOWER ANTERIOR RESECTION TRANS ABDOMINAL AND TRANS ANAL, COLOANAL HANDSEWN ANASTAOSIS, DIVERTING LOPE ILIOSTOMY, RESECTION GIST TUMOR (N/A) LAPAROSCOPIC LYSIS OF ADHESIONS  Patient location during evaluation: PACU Anesthesia Type: General Level of consciousness: awake and alert Pain management: pain level controlled Vital Signs Assessment: post-procedure vital signs reviewed and stable Respiratory status: spontaneous breathing, nonlabored ventilation, respiratory function stable and patient connected to nasal cannula oxygen Cardiovascular status: blood pressure returned to baseline and stable Postop Assessment: no signs of nausea or vomiting Anesthetic complications: no    Last Vitals:  Filed Vitals:   06/04/15 1645 06/04/15 1659  BP: 111/56 118/57  Pulse: 82 86  Temp:  36.4 C  Resp: 11 12    Last Pain:  Filed Vitals:   06/04/15 1700  PainSc: [place]

## 2015-06-04 NOTE — H&P (Signed)
Brianna Walker 03/16/2015 12:10 PM Location: [facility] Patient #: [id number] DOB: 17-Feb-1946 Married / Language: English / Race: Black or African American Female   History of Present Illness  Patient words: reck.  The patient is a 70 year old female who presents with an abdominal mass. Patient follows up for Extraluminal pelvic gastrointestinal stromal tumor.  Pleasant obese female. History of numerous abdominal surgeries. Has had ventral hernia repairs done in an open fashion by Dr. Bubba Camp whom has since retired. Has chronic fluid collection in periumbilical region.  Patient noted that her stools became more narrow over the past year or so. More constipated. Occasional rectal bleeding. Recall some history of hemorrhoids in the past. Has bowel movement about twice a week. No problems with incontinence to flatus or stool. No incontinence to urine. She had abdominal hysterectomy when she was in her forties for prolapse. Does not recall any diagnosis of cancer. She can walk a half hour without difficulty. She does not smoke. She is on chronic anticoagulation for recurrent pulmonary embolism. Been at least 5 years. She underwent colonoscopy and found to have an obvious bulging mass in her rectum. CT scan showed large bulky mass as well. Endorectal ultrasound noted mass between vagina and rectum in lower pelvis. No definite invasion into any organ. Moderately large fluid collection on anterior abdominal wall but not not solid. Biopsy of pelvic mass consistent with gastrointestinal stromal tumor. Surgical consultation requested.  Patient has undergone neoadjuvant chemotherapy with Gleevec. Started May 2016. She feels better. Less fullness. Less constipation. She thinks the mass has gotten smaller. No incontinence to flatus or stool.   Ready for surgery.  Finished 12 month Gleevec        CLINICAL DATA: Malignant gastrointestinal stromal tumor  for restaging prior to surgical treatment. Subsequent encounter.  EXAM: MRI PELVIS WITHOUT AND WITH CONTRAST  TECHNIQUE: Multiplanar multisequence MR imaging of the pelvis was performed both before and after administration of intravenous contrast.  CONTRAST: 47mL MULTIHANCE GADOBENATE DIMEGLUMINE 529 MG/ML IV SOLN  COMPARISON: Pelvic CT 07/21/2004)  FINDINGS: Again demonstrated is a large anterior anorectal mass with anterior extension between the cervix and rectosigmoid junction. This mass has decreased in size compared with the prior CT, measuring 9.2 x 5.7 x 6.9 cm. The lesion is well-circumscribed with mild T2 heterogeneity. Post-contrast, there is a persistent enhancing nodule posteriorly in the mass which measures approximately 2.6 cm. The majority of the mass shows little to no enhancement, and there is some central necrosis.  The mass demonstrates no invasion of the vagina, rectum or bladder base, although there is extrinsic mass effect on those structures. A degree of pelvic floor laxity again noted. Proximal to the mass, the rectosigmoid colon is mildly distended and fluid-filled. There is no hydronephrosis or pelvic lymphadenopathy. Small inguinal lymph nodes bilaterally are unchanged.  A large complex fluid collection within the anterior abdominal wall is incompletely visualized by this examination, although grossly stable. This collection measures up to 12.5 cm transverse and does not demonstrate any abnormal enhancement.  IMPRESSION: 1. Interval decreased size of large anorectal mass with central necrosis. There is some peripheral enhancement posteriorly within the lesion. 2. No evidence of direct invasion of the vagina, cervix or bladder base. No evidence of distant metastases or hydronephrosis. 3. Grossly stable complex fluid collection within the anterior abdominal wall, incompletely visualized.   Electronically Signed By: Richardean Sale M.D. On:  12/15/2014 08:17   CLINICAL DATA: Newly diagnosed rectal mass. Change in bowel habits.  Rectal bleeding.  EXAM: CT ABDOMEN AND PELVIS WITH CONTRAST  TECHNIQUE: Multidetector CT imaging of the abdomen and pelvis was performed using the standard protocol following bolus administration of intravenous contrast.  CONTRAST: 154mL OMNIPAQUE IOHEXOL 300 MG/ML SOLN  COMPARISON: 10/11/2005  FINDINGS: Lower chest: Clear lung bases. Moderate cardiomegaly with age advanced multivessel coronary artery atherosclerosis. Small hiatal hernia. No pericardial or pleural effusion. Note is made of prominence of the superior most aspect of the IVC, including at its insertion into the right atrium. Example image 12 series 2. This was present back on 10/11/2005. The hepatic veins and intrahepatic IVC are not distended.  Hepatobiliary: Normal liver. Cholecystectomy, without biliary ductal dilatation.  Pancreas: Normal, without mass or ductal dilatation.  Spleen: Normal  Adrenals/Urinary Tract: Normal adrenal glands. Too small to characterize lesions within both kidneys. No hydronephrosis. Degraded evaluation of the pelvis, secondary to beam hardening artifact from right hip arthroplasty. Grossly normal urinary bladder.  Stomach/Bowel: Normal remainder of stomach. Normal small bowel loops.  Normal terminal ileum. Normal caliber of the colon, without evidence of obstruction. Soft tissue mass involves the rectum and anus. There is likely a component of pelvic floor laxity and possible rectal prolapse. Mass measures on the order of 8.2 x 9.3 cm. Given above limitations, no perirectal adenopathy is identified.  Vascular/Lymphatic: Aortic and branch vessel atherosclerosis. No retroperitoneal or retrocrural adenopathy. No pelvic sidewall adenopathy.  Reproductive: Hysterectomy. No adnexal mass.  Other: No significant free fluid. No evidence of omental or peritoneal disease.  Repeat  ventral abdominal wall hernia repair. Transverse colon is positioned just deep to the hernia repair site, including on image 41 of series 2. Enlargement of an anterior pelvic wall collection which measures slightly greater than simple fluid density. 9.0 x 12.0 cm on image 66 versus 7.7 x 4.8 cm on the prior exam.  Musculoskeletal: Right hip arthroplasty. Left hip osteoarthritis. Minimal convex left lumbar spine curvature.  IMPRESSION: 1. Degraded evaluation of the pelvis, secondary to beam hardening artifact from right hip arthroplasty and less so patient body habitus. 2. Large anal-rectal mass, likely with superimposed pelvic floor laxity and/or rectal prolapse. No obstruction or surrounding adenopathy. 3. No evidence of distant metastasis. 4. Repeat ventral abdominal wall hernia repair with increase in complex fluid collection in the anterior pelvic wall. Likely a chronic hematoma. 5. Prominence of the supra hepatic IVC. Given chronicity back to 2007, likely within normal variation. 6. Age advanced coronary artery and aortic atherosclerosis. Recommend assessment of coronary risk factors and consideration of medical therapy.   Electronically Signed By: Abigail Miyamoto M.D. On: 07/22/2014 15:52    Problem List/Past Medical Adin [name], MD; 03/16/2015 12:55 PM) GASTROINTESTINAL STROMAL TUMOR (GIST) OF LARGE INTESTINE CHRONIC CONSTIPATION (K59.09) ABDOMINAL WALL SEROMA, SEQUELA (T88.8XXS)  Other Problems Adin [name], MD; 03/16/2015 12:55 PM) Back Pain Cerebrovascular Accident Gastroesophageal Reflux Disease Hemorrhoids High blood pressure Sickle cell disease  Past Surgical History Adin [name], MD; 03/16/2015 12:55 PM) Colon Polyp Removal - Colonoscopy Gallbladder Surgery - Laparoscopic Hip Surgery Right. Hysterectomy (not due to cancer) - Partial Shoulder Surgery Right.  Diagnostic Studies History Adin [name], MD; 03/16/2015 12:55  PM) Colonoscopy within last year Mammogram within last year Pap Smear >5 years ago  Allergies Davy Pique Bynum, [place]; 03/16/2015 12:12 PM) No Known Drug Allergies04/13/2016  Medication History ([place], CMA; 03/16/2015 12:12 PM) TraMADol HCl (50MG  Tablet, Oral) Active. Benazepril HCl (40MG  Tablet, Oral) Active. Eliquis (5MG  Tablet, Oral) Active. Estradiol (0.025MG /24HR Patch TW, Transdermal) Active. MoviPrep (  100GM For Solution, Oral) Active. Omeprazole (20MG  Capsule DR, Oral) Active. Pravastatin Sodium (20MG  Tablet, Oral) Active. Medications Reconciled  Social History Adin [name], MD; 03/16/2015 12:55 PM) Alcohol use Occasional alcohol use. Caffeine use Carbonated beverages, Coffee, Tea. No drug use Tobacco use Former smoker.  Family History Adin [name], MD; 03/16/2015 12:55 PM) Colon Cancer Father. Colon Polyps Father. Diabetes Mellitus Sister. Heart Disease Mother, Sister. Heart disease in female family member before age 58 Malignant Neoplasm Of Pancreas Father.  Pregnancy / Birth History Adin [name], MD; 03/16/2015 12:55 PM) Age at menarche 38 years. Age of menopause <47 Gravida 2 Irregular periods Maternal age 60-20 Para 2    Review of Systems Adin [name], MD; 03/16/2015 12:56 PM) General Present- Fatigue, Night Sweats and Weight Gain. Not Present- Appetite Loss, Chills, Fever and Weight Loss. Skin Present- Dryness. Not Present- Change in Wart/Mole, Hives, Jaundice, New Lesions, Non-Healing Wounds, Rash and Ulcer. HEENT Present- Seasonal Allergies, Sinus Pain, Visual Disturbances and Wears glasses/contact lenses. Not Present- Earache, Hearing Loss, Hoarseness, Nose Bleed, Oral Ulcers, Ringing in the Ears, Sore Throat and Yellow Eyes. Cardiovascular Present- Leg Cramps. Not Present- Chest Pain, Difficulty Breathing Lying Down, Palpitations, Rapid Heart Rate, Shortness of Breath and Swelling of Extremities. Gastrointestinal  Present- Bloating, Bloody Stool, Change in Bowel Habits, Constipation, Hemorrhoids and Rectal Pain. Not Present- Abdominal Pain, Chronic diarrhea, Difficulty Swallowing, Excessive gas, Gets full quickly at meals, Indigestion, Nausea and Vomiting. Female Genitourinary Present- Nocturia. Not Present- Frequency, Painful Urination, Pelvic Pain and Urgency. Musculoskeletal Present- Back Pain and Joint Stiffness. Not Present- Joint Pain, Muscle Pain, Muscle Weakness and Swelling of Extremities. Neurological Present- Decreased Memory. Not Present- Fainting, Headaches, Numbness, Seizures, Tingling, Tremor, Trouble walking and Weakness. Endocrine Present- Heat Intolerance and Hot flashes. Not Present- Cold Intolerance, Excessive Hunger, Hair Changes and New Diabetes. Hematology Present- Easy Bruising. Not Present- Excessive bleeding, Gland problems, HIV and Persistent Infections.  Vitals (Sonya Bynum CMA; 03/16/2015 12:12 PM) 03/16/2015 12:11 PM Weight: 223.8 lb Height: 63in Body Surface Area: 2.03 m Body Mass Index: 39.64 kg/m  Temp.: 38F(Temporal)  Pulse: 61 (Regular)  BP: 126/78 (Sitting, Left Arm, Standard)       Physical Exam Adin [name] MD; 03/16/2015 2:20 PM) General Mental Status-Alert. General Appearance-Not in acute distress, Not Sickly. Orientation-Oriented X3. Hydration-Well hydrated. Voice-Normal.  Integumentary Global Assessment Normal Exam - Axillae: non-tender, no inflammation or ulceration, no drainage. and Distribution of scalp and body hair is normal. General Characteristics Temperature - normal warmth is noted.  Head and Neck Head-normocephalic, atraumatic with no lesions or palpable masses. Face Global Assessment - atraumatic, no absence of expression. Neck Global Assessment - no abnormal movements, no bruit auscultated on the right, no bruit auscultated on the left, no decreased range of motion,  non-tender. Trachea-midline. Thyroid Gland Characteristics - non-tender.  Eye Eyeball - Left-Extraocular movements intact, No Nystagmus. Eyeball - Right-Extraocular movements intact, No Nystagmus. Cornea - Left-No Hazy. Cornea - Right-No Hazy. Sclera/Conjunctiva - Left-No scleral icterus, No Discharge. Sclera/Conjunctiva - Right-No scleral icterus, No Discharge. Pupil - Left-Direct reaction to light normal. Pupil - Right-Direct reaction to light normal.  ENMT Ears Pinna - Left - no drainage observed, no generalized tenderness observed. Right - no drainage observed, no generalized tenderness observed. Nose and Sinuses Nose - no destructive lesion observed. Nares - Left - quiet respiration. Right - quiet respiration. Mouth and Throat Lips - Upper Lip - no fissures observed, no pallor noted. Lower Lip - no fissures observed, no pallor  noted. Nasopharynx - no discharge present. Oral Cavity/Oropharynx - Tongue - no dryness observed. Oral Mucosa - no cyanosis observed. Hypopharynx - no evidence of airway distress observed.  Chest and Lung Exam Inspection Movements - Normal and Symmetrical. Accessory muscles - No use of accessory muscles in breathing. Palpation Normal exam - Non-tender. Auscultation Breath sounds - Normal and Clear.  Cardiovascular Auscultation Rhythm - Regular. Murmurs & Other Heart Sounds - Normal exam - No Murmurs and No Systolic Clicks.  Abdomen Inspection Normal Exam - No Visible peristalsis and No Abnormal pulsations. Umbilicus - No Bleeding, No Urine drainage. Palpation/Percussion Normal exam - Soft, Non Tender, No Rebound tenderness, No Rigidity (guarding) and No Cutaneous hyperesthesia. Note: 10x10cm SQ spherical mass periumbilical. Morbidly obese. No abdominal tenderness. No guarding. No drainage.   Female Genitourinary Sexual Maturity Tanner 5 - Adult hair pattern. Note: Smooth but bulky 7 cm mass between posterior vaginal  wall and rectum. Comes down to but not into the sphincters. Maybe a little smaller. Not sensitive / fluctuant. Significant bulging into the vagina >> rectum but not completely obstructing. No erosion into the vagina or rectal mucosa. Normal sphincter tone. Posterior rectum smooth and not involved. Spherical. No vaginal bleeding nor discharge   Rectal Note: Exam done with assistance of female Medical Assistant in the room. Perianal skin clean with good hygiene. No pruritis ani. No pilonidal disease. No fissure. No abscess/fistula. Deceraes sphincter tone. Tolerates digital rectal exam. Again bulky anterior rectal wall mass involving a third of the circumference. More narrow now. Bulging in but not causing complete obstruction now. Comes down to but not into the sphincters. Some posterior hemorrhoid prolapse but nothing severe   Peripheral Vascular Upper Extremity Inspection - Left - No Cyanotic nailbeds, Not Ischemic. Right - No Cyanotic nailbeds, Not Ischemic.  Neurologic Neurologic evaluation reveals -normal attention span and ability to concentrate, able to name objects and repeat phrases. Appropriate fund of knowledge , normal sensation and normal coordination. Mental Status Affect - not angry, not paranoid. Cranial Nerves-Normal Bilaterally. Gait-Normal.  Neuropsychiatric Mental status exam performed with findings of-able to articulate well with normal speech/language, rate, volume and coherence, thought content normal with ability to perform basic computations and apply abstract reasoning and no evidence of hallucinations, delusions, obsessions or homicidal/suicidal ideation.  Musculoskeletal Global Assessment Spine, Ribs and Pelvis - no instability, subluxation or laxity. Right Upper Extremity - no instability, subluxation or laxity.  Lymphatic Head & Neck General Head & Neck Lymphatics: Bilateral - Description - No Localized lymphadenopathy. Axillary General  Axillary Region: Bilateral - Description - No Localized lymphadenopathy. Femoral & Inguinal Generalized Femoral & Inguinal Lymphatics: Left: Right - Description - No Localized lymphadenopathy. Description - No Localized lymphadenopathy.    Assessment & Plan  GASTROINTESTINAL STROMAL TUMOR (GIST) OF LARGE INTESTINE (C49.A4)  Impression: Bulky tumor in the rectovaginal septum consistent with gastrointestinal stromal tumor. Most likely rectal wall etiology. Endorectal ultrasound and MRI show no invasion into rectum or vagina.  At some point this will require resection. I think I would like to start out robotically. See if we can get around the mass and see what requires removal. May be able to do just a rectal wall wedge resection of the mass although would like to have at least 2 cm margins to avoid recurrence. It may require posterior vaginal wall resection. It may shell out easily but I am skeptical that it will be that straightforward. Most likely will need low anterior resection with very low anastomosis and temporary  diverting loop ileostomy. Looking at the MRI, it does not seem to be originating in the distal rectum so hopefully can avoid an abdominoperineal resection and permanent colostomy.  Because there was some evidence of tumor necrosis by MRI, I think there is room for this tumor to continue to shrink. In re-reviewing the literature, most people benefit from neoadjuvant Gleevec 3-12 months preoperatively. She is just at the six-month phase. Would like to plan surgery 3 months from now to see if the mass will shrink more, but I suspect we are getting close to the nadir since and has not shrunk as much since last exam.  Finished 1 year gleevec & ready for surgery  The patient and her mother feel reassured and agree with this plan. Current Plans Instructions:  You have a large bulky mass in your lower pelvis between your vagina and rectum. Biopsy consistent with gastrointestinal stromal  tumor = GIST. You will benefit from surgical resection for this.  You would benefit from continued use of Gleevec chemotherapy to shrink the tumor. Most likely will need 6-9 months, depending on shrinkage of tumor. Would not wait more than 12 months of chemotherapy.  We'll plan to do surgical resection in January 2017 after the Holidays. That allows the tumor to be as small as possible.  Follow up if no improvement or if symptoms worsen Pt Education - [facility] Pelvic Floor Exercises (Kegels) and Dysfunction HCI (Shauntae Reitman) Pt Education - Patient information: Soft tissue sarcoma (The Basics): discussed with patient and provided information. You are being scheduled for surgery - Our schedulers will call you.  You should hear from our office's scheduling department within 5 working days about the location, date, and time of surgery. We try to make accommodations for patient's preferences in scheduling surgery, but sometimes the OR schedule or the surgeon's schedule prevents [place] from making those accommodations.  If you have not heard from our office [phone]) in 5 working days, call the office and ask for your surgeon's nurse.  If you have other questions about your diagnosis, plan, or surgery, call the office and ask for your surgeon's nurse.  Written instructions provided I recommended obtaining preoperative cardiac clearance. I am concerned about the health of the patient and the ability to tolerate the operation. Since she has not had any new cardiopulmonary events, her primary care physician Dr. Jenny Reichmann feels it is safe to come off the Eliquis anticoagulation 3 days prior to surgery Pt Education - [facility] Colon Bowel Prep 2015 Miralax/Antibiotics Started Neomycin Sulfate 500MG , 2 (two) Tablet SEE NOTE, #6, 03/16/2015, No Refill. Local Order: TAKE TWO TABLETS AT 2 PM, 3 PM, AND 10 PM THE DAY PRIOR TO SURGERY Started Flagyl 500MG , 2 (two) Tablet SEE NOTE, #6, 03/16/2015, No Refill. Local Order: Take at  2pm, 3pm, and 10pm the day prior to your colon operation Pt Education - CCS Colectomy post-op instructions: discussed with patient and provided information. Pt Education - [facility] Pelvic Floor Exercises (Kegels) and Dysfunction HCI (Bubber Rothert) CHRONIC CONSTIPATION (K59.09) Current Plans Pt Education - [facility]CS Good Bowel Health (Jabriel Vanduyne)  ABDOMINAL WALL SEROMA, SEQUELA (T88.8XXS) Impression: She does have any large anterior abdominal wall fluid collection consistent with chronic fluid/seroma in hernia sac. It is a separate issue. While the seroma was 2007 and has gotten slightly larger, there is no pelvic mass then. Her uterus resection pathology was decades ago but she assures me it was not for cancer and was for prolapse issues. Would not try and manage his at the  same time as the pelvic resection as risks of infections or higher. Can address that and a separate surgery after recovering from the more recent surgery. Would not like to aggressively treat that area unless it limits surgery.  Adin [name], M.D., F.A.C.S. Gastrointestinal and Minimally Invasive Surgery [facility] [address] [place], [place] [address] [phone] Main / Paging

## 2015-06-05 LAB — BASIC METABOLIC PANEL
Anion gap: 8 (ref 5–15)
BUN: 12 mg/dL (ref 6–20)
CHLORIDE: 108 mmol/L (ref 101–111)
CO2: 24 mmol/L (ref 22–32)
CREATININE: 1.36 mg/dL — AB (ref 0.44–1.00)
Calcium: 7.6 mg/dL — ABNORMAL LOW (ref 8.9–10.3)
GFR calc Af Amer: 46 mL/min — ABNORMAL LOW (ref >60)
GFR calc non Af Amer: 39 mL/min — ABNORMAL LOW (ref >60)
Glucose, Bld: 117 mg/dL — ABNORMAL HIGH (ref 65–99)
POTASSIUM: 3.6 mmol/L (ref 3.5–5.1)
Sodium: 140 mmol/L (ref 135–145)

## 2015-06-05 LAB — CBC
HEMATOCRIT: 26.5 % — AB (ref 36.0–46.0)
Hemoglobin: 8.8 g/dL — ABNORMAL LOW (ref 12.0–15.0)
MCH: 29.6 pg (ref 26.0–34.0)
MCHC: 33.2 g/dL (ref 30.0–36.0)
MCV: 89.2 fL (ref 78.0–100.0)
PLATELETS: 215 10*3/uL (ref 150–400)
RBC: 2.97 MIL/uL — AB (ref 3.87–5.11)
RDW: 14.5 % (ref 11.5–15.5)
WBC: 9.3 10*3/uL (ref 4.0–10.5)

## 2015-06-05 LAB — MAGNESIUM: Magnesium: 1.5 mg/dL — ABNORMAL LOW (ref 1.7–2.4)

## 2015-06-05 MED ORDER — SODIUM CHLORIDE 0.9 % IV BOLUS (SEPSIS)
500.0000 mL | Freq: Once | INTRAVENOUS | Status: AC
  Administered 2015-06-05: 500 mL via INTRAVENOUS

## 2015-06-05 MED ORDER — KCL IN DEXTROSE-NACL 20-5-0.45 MEQ/L-%-% IV SOLN
INTRAVENOUS | Status: DC
  Administered 2015-06-05 – 2015-06-07 (×4): via INTRAVENOUS
  Filled 2015-06-05 (×6): qty 1000

## 2015-06-05 NOTE — Progress Notes (Signed)
1 Day Post-Op Robotic LAR with coloanal anastomosis and diverting loop ileostomy Subjective: Pt very sedated this am.  Unable to answer questions without falling asleep.    Objective: Vital signs in last 24 hours: Temp:  [97.4 F (36.3 C)-98.4 F (36.9 C)] 98.1 F (36.7 C) (02/04 0605) Pulse Rate:  [70-95] 75 (02/04 0605) Resp:  [11-18] 16 (02/04 0605) BP: (111-186)/(55-93) 120/93 mmHg (02/04 0605) SpO2:  [95 %-100 %] 97 % (02/04 0605) Weight:  [102.8 kg (226 lb 10.1 oz)] 102.8 kg (226 lb 10.1 oz) (02/04 0500)   Intake/Output from previous day: 02/03 0701 - 02/04 0700 In: 5691.3 [I.V.:5591.3; IV Piggyback:100] Out: 1805 [Urine:1255; Emesis/NG output:120; Drains:280; Blood:150] Intake/Output this shift:     General appearance: alert and cooperative GI: soft, distended Ostomy: beefy red JP: SS fluid Incision: no significant drainage, no significant erythema  Lab Results:   Recent Labs  06/05/15 0512  WBC 9.3  HGB 8.8*  HCT 26.5*  PLT 215   BMET  Recent Labs  06/05/15 0512  NA 140  K 3.6  CL 108  CO2 24  GLUCOSE 117*  BUN 12  CREATININE 1.36*  CALCIUM 7.6*   PT/INR  Recent Labs  06/04/15 0645  LABPROT 13.7  INR 1.03   ABG No results for input(s): PHART, HCO3 in the last 72 hours.  Invalid input(s): PCO2, PO2  MEDS, Scheduled . acetaminophen  1,000 mg Oral TID  . alvimopan  12 mg Oral BID  . aspirin EC  81 mg Oral QHS  . enoxaparin (LOVENOX) injection  40 mg Subcutaneous Q24H  . [START ON 06/09/2015] estradiol  0.05 mg Transdermal Weekly  . feeding supplement (ENSURE ENLIVE)  237 mL Oral BID BM  . lip balm  1 application Topical BID  . pantoprazole  80 mg Oral QHS  . pravastatin  20 mg Oral QHS  . saccharomyces boulardii  250 mg Oral BID  . vitamin C  500 mg Oral BID    Studies/Results: No results found.  Assessment: s/p Procedure(s): XI ROBOTIC ASSISTED LYSIS OF ADHESIONS, LOWER ANTERIOR RESECTION TRANS ABDOMINAL AND TRANS ANAL,  COLOANAL HANDSEWN ANASTAOSIS, DIVERTING LOPE ILIOSTOMY, RESECTION GIST TUMOR LAPAROSCOPIC LYSIS OF ADHESIONS Patient Active Problem List   Diagnosis Date Noted  . 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 Abnormal thyroid stimulating hormone (TSH) level 03/05/2014  . Metabolic syndrome [id number]  . Chronic pain syndrome 02/11/2014  . Encounter for therapeutic drug monitoring 07/18/2013  . Tongue pain 07/18/2013  . Family history of colon cancer 01/26/2013  . Hot flashes 01/26/2013  . Chest pain 12/27/2012  . DOE (dyspnea on exertion) 12/27/2012  . Obesity 08/16/2012  . Tongue disorder 08/12/2012  . Hematochezia 05/28/2012  . Iron deficiency anemia 12/07/2011  . Dyspnea 12/05/2011  . Chronic sinusitis 08/14/2011  . Rash 08/14/2011  . Left shoulder pain 08/14/2011  . TIA (transient ischemic attack) 02/24/2011  . Long term (current) use of anticoagulants 11/29/2010  . DEEP VENOUS THROMBOPHLEBITIS, LEG, RIGHT 03/04/2010  . OVERACTIVE BLADDER 03/04/2010  . FREQUENCY, URINARY 10/07/2009  . MENOPAUSAL DISORDER 09/20/2009  . HIP PAIN, RIGHT, CHRONIC 08/11/2009  . Cramp of limb 08/11/2009  . SKIN LESION 07/20/2009  . OTHER NONSPECIFIC FINDINGS EXAMINATION OF BLOOD 09/15/2008  . BACK PAIN 06/19/2008  . LEG PAIN, RIGHT 06/19/2008  . INSOMNIA-SLEEP DISORDER-UNSPEC 06/19/2008  . RECTAL BLEEDING 10/07/2007  . Hyperlipidemia 09/12/2007  . ALLERGIC RHINITIS 09/12/2007  . SCIATICA, RIGHT 09/12/2007  . MASS, SUPERFICIAL  09/12/2007  . GERD 12/05/2006  . HEMORRHOIDS 11/17/2006  . TMJ SYNDROME 11/17/2006  . HX, PERSONAL, TUBERCULOSIS 11/17/2006  . PULMONARY EMBOLISM, HX OF 11/17/2006     Plan: Cont NPO today given pt is still very sedated  Cont IVF's OOB today   LOS: 1 day     .Rosario Adie, [facility], [place]   06/05/2015 7:32 AM

## 2015-06-06 NOTE — Progress Notes (Signed)
2 Days Post-Op Robotic LAR with coloanal anastomosis and diverting loop ileostomy Subjective: Pt much more awake and alert.  Ambulated 3 times yesterday.  Having ostomy function.  No nausea.  Tolerating clears.  Objective: Vital signs in last 24 hours: Temp:  [97.4 F (36.3 C)-99.9 F (37.7 C)] 97.4 F (36.3 C) (02/05 0628) Pulse Rate:  [76-110] 82 (02/05 0628) Resp:  [16-18] 16 (02/05 0628) BP: (103-165)/(50-82) 153/72 mmHg (02/05 0628) SpO2:  [96 %-99 %] 99 % (02/05 0628) Weight:  [104.01 kg (229 lb 4.8 oz)] 104.01 kg (229 lb 4.8 oz) (02/04 2224)   Intake/Output from previous day: 02/04 0701 - 02/05 0700 In: 2200 [I.V.:1700; IV Piggyback:500] Out: [id number] [Urine:1550; Drains:95; Stool:150] Intake/Output this shift:   General appearance: alert and cooperative GI: soft, distended Ostomy: beefy red with some dark patches, edematous JP: SS fluid Incision: no significant drainage, no significant erythema  Lab Results:   Recent Labs  06/05/15 0512  WBC 9.3  HGB 8.8*  HCT 26.5*  PLT 215   BMET  Recent Labs  06/05/15 0512  NA 140  K 3.6  CL 108  CO2 24  GLUCOSE 117*  BUN 12  CREATININE 1.36*  CALCIUM 7.6*   PT/INR  Recent Labs  06/04/15 0645  LABPROT 13.7  INR 1.03   ABG No results for input(s): PHART, HCO3 in the last 72 hours.  Invalid input(s): PCO2, PO2  MEDS, Scheduled . acetaminophen  1,000 mg Oral TID  . alvimopan  12 mg Oral BID  . aspirin EC  81 mg Oral QHS  . enoxaparin (LOVENOX) injection  40 mg Subcutaneous Q24H  . [START ON 06/09/2015] estradiol  0.05 mg Transdermal Weekly  . feeding supplement (ENSURE ENLIVE)  237 mL Oral BID BM  . lip balm  1 application Topical BID  . pantoprazole  80 mg Oral QHS  . pravastatin  20 mg Oral QHS  . saccharomyces boulardii  250 mg Oral BID  . vitamin C  500 mg Oral BID    Studies/Results: No results found.  Assessment: s/p Procedure(s): XI ROBOTIC ASSISTED LYSIS OF ADHESIONS, LOWER ANTERIOR  RESECTION TRANS ABDOMINAL AND TRANS ANAL, COLOANAL HANDSEWN ANASTAOSIS, DIVERTING LOPE ILIOSTOMY, RESECTION GIST TUMOR LAPAROSCOPIC LYSIS OF ADHESIONS Patient Active Problem List   Diagnosis Date Noted  . 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 Abnormal thyroid stimulating hormone (TSH) level 03/05/2014  . Metabolic syndrome [id number]  . Chronic pain syndrome 02/11/2014  . Encounter for therapeutic drug monitoring 07/18/2013  . Tongue pain 07/18/2013  . Family history of colon cancer 01/26/2013  . Hot flashes 01/26/2013  . Chest pain 12/27/2012  . DOE (dyspnea on exertion) 12/27/2012  . Obesity 08/16/2012  . Tongue disorder 08/12/2012  . Hematochezia 05/28/2012  . Iron deficiency anemia 12/07/2011  . Dyspnea 12/05/2011  . Chronic sinusitis 08/14/2011  . Rash 08/14/2011  . Left shoulder pain 08/14/2011  . TIA (transient ischemic attack) 02/24/2011  . Long term (current) use of anticoagulants 11/29/2010  . DEEP VENOUS THROMBOPHLEBITIS, LEG, RIGHT 03/04/2010  . OVERACTIVE BLADDER 03/04/2010  . FREQUENCY, URINARY 10/07/2009  . MENOPAUSAL DISORDER 09/20/2009  . HIP PAIN, RIGHT, CHRONIC 08/11/2009  . Cramp of limb 08/11/2009  . SKIN LESION 07/20/2009  . OTHER NONSPECIFIC FINDINGS EXAMINATION OF BLOOD 09/15/2008  . BACK PAIN 06/19/2008  . LEG PAIN, RIGHT 06/19/2008  . INSOMNIA-SLEEP DISORDER-UNSPEC 06/19/2008  . RECTAL BLEEDING 10/07/2007  . Hyperlipidemia 09/12/2007  . ALLERGIC RHINITIS 09/12/2007  .  SCIATICA, RIGHT 09/12/2007  . MASS, SUPERFICIAL 09/12/2007  . GERD 12/05/2006  . HEMORRHOIDS 11/17/2006  . TMJ SYNDROME 11/17/2006  . HX, PERSONAL, TUBERCULOSIS 11/17/2006  . PULMONARY EMBOLISM, HX OF 11/17/2006     Plan: Cont IVF's, will decrease to 57ml/h Advance to soft diet Cont to ambulate   LOS: 2 days     .Rosario Adie, [facility], [place] [phone]   06/06/2015 7:48 AM

## 2015-06-07 ENCOUNTER — Encounter (HOSPITAL_COMMUNITY): Payer: Self-pay | Admitting: Surgery

## 2015-06-07 LAB — CBC
HCT: 22.9 % — ABNORMAL LOW (ref 36.0–46.0)
HEMOGLOBIN: 7.5 g/dL — AB (ref 12.0–15.0)
MCH: 29.2 pg (ref 26.0–34.0)
MCHC: 32.8 g/dL (ref 30.0–36.0)
MCV: 89.1 fL (ref 78.0–100.0)
Platelets: 204 10*3/uL (ref 150–400)
RBC: 2.57 MIL/uL — AB (ref 3.87–5.11)
RDW: 14.9 % (ref 11.5–15.5)
WBC: 8.4 10*3/uL (ref 4.0–10.5)

## 2015-06-07 LAB — BASIC METABOLIC PANEL
ANION GAP: 7 (ref 5–15)
BUN: 8 mg/dL (ref 6–20)
CHLORIDE: 110 mmol/L (ref 101–111)
CO2: 27 mmol/L (ref 22–32)
CREATININE: 0.92 mg/dL (ref 0.44–1.00)
Calcium: 8.4 mg/dL — ABNORMAL LOW (ref 8.9–10.3)
GFR calc non Af Amer: >60 mL/min (ref >60)
Glucose, Bld: 117 mg/dL — ABNORMAL HIGH (ref 65–99)
POTASSIUM: 3.3 mmol/L — AB (ref 3.5–5.1)
SODIUM: 144 mmol/L (ref 135–145)

## 2015-06-07 LAB — MAGNESIUM: MAGNESIUM: 1.6 mg/dL — AB (ref 1.7–2.4)

## 2015-06-07 MED ORDER — BISMUTH SUBSALICYLATE 262 MG/15ML PO SUSP: 30.0000 mL | Freq: Three times a day (TID) | ORAL | Status: DC | PRN

## 2015-06-07 MED ORDER — OXYCODONE HCL 5 MG PO TABS
5.0000 mg | ORAL_TABLET | ORAL | Status: DC | PRN
  Administered 2015-06-07 – 2015-06-10 (×6): 10 mg via ORAL
  Filled 2015-06-07 (×6): qty 2

## 2015-06-07 MED ORDER — LOPERAMIDE HCL 2 MG PO CAPS: 2.0000 mg | ORAL_CAPSULE | Freq: Three times a day (TID) | ORAL | Status: DC | PRN

## 2015-06-07 MED ORDER — LOPERAMIDE HCL 2 MG PO CAPS
2.0000 mg | ORAL_CAPSULE | Freq: Every day | ORAL | Status: DC
  Administered 2015-06-07: 2 mg via ORAL
  Filled 2015-06-07 (×2): qty 1

## 2015-06-07 MED ORDER — LACTATED RINGERS IV BOLUS (SEPSIS)
1000.0000 mL | Freq: Once | INTRAVENOUS | Status: AC
  Administered 2015-06-07: 1000 mL via INTRAVENOUS

## 2015-06-07 MED ORDER — LACTATED RINGERS IV BOLUS (SEPSIS)
1000.0000 mL | Freq: Three times a day (TID) | INTRAVENOUS | Status: AC | PRN
  Administered 2015-06-07 – 2015-06-08 (×2): 1000 mL via INTRAVENOUS
  Filled 2015-06-07 (×2): qty 1000

## 2015-06-07 MED ORDER — POTASSIUM CHLORIDE CRYS ER 20 MEQ PO TBCR
40.0000 meq | EXTENDED_RELEASE_TABLET | Freq: Two times a day (BID) | ORAL | Status: DC
  Administered 2015-06-07 – 2015-06-08 (×4): 40 meq via ORAL
  Filled 2015-06-07 (×7): qty 2

## 2015-06-07 NOTE — Progress Notes (Signed)
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 Resection of cystic mass near small bowel with serosal repair.  Surgeon(s): Michael Boston, MD Leighton Ruff, MD - Assist    Plan:  -wean IVF -solid diet -replace low K, check Mag -OSTOMY CARE / TRAINING -VTE prophylaxis- SCDs, etc -mobilize as tolerated to help recovery  Adin [name], M.D., F.A.C.S. Gastrointestinal and Minimally Invasive Surgery [facility] [address] [place], [place] [address] [phone] Main / Paging   06/07/2015  Subjective:  Feeling better Walking in hallways Ostomy bag changed  Objective:  Vital signs:  Filed Vitals:   06/06/15 1400 06/06/15 2046 06/06/15 2108 06/07/15 0554  BP: 174/92  140/76 138/60  Pulse:   99 96  Temp: 98 F (36.7 C) 98.4 F (36.9 C)  98.3 F (36.8 C)  TempSrc: Oral   Oral  Resp: 16   16  Height:      Weight:    103.4 kg (227 lb 15.3 oz)  SpO2: 95%   98%    Last BM Date: 06/06/15  Intake/Output   Yesterday:  02/05 0701 - 02/06 0700 In: 3734 [P.O.:340; I.V.:1500] Out: 2010 [Urine:1700; Drains:35; Stool:275] This shift:     Bowel function:  Flatus: y  BM: y thick effluent in bag  Drain: serodanguinous  Physical  Exam:  General: Pt awake/alert/oriented x4 in no acute distress Eyes: PERRL, normal EOM.  Sclera clear.  No icterus Neuro: CN II-XII intact w/o focal sensory/motor deficits. Lymph: No head/neck/groin lymphadenopathy Psych:  No delerium/psychosis/paranoia HENT: Normocephalic, Mucus membranes moist.  No thrush Neck: Supple, No tracheal deviation Chest: No chest wall pain w good excursion CV:  Pulses intact.  Regular rhythm MS: Normal AROM mjr joints.  No obvious deformity Abdomen: Soft.  Nondistended.  Mildly tender at incisions only.  No evidence of peritonitis.  No incarcerated hernias.  Gas/thick succus in RUQ ileostomy bag. Ext:  SCDs BLE.  No mjr edema.  No cyanosis Skin: No petechiae / purpura  Results:   Labs: Results for orders placed or performed during the hospital encounter of 06/04/15 (from the past 48 hour(s))  CBC     Status: Abnormal   Collection Time: 06/07/15  5:30 AM  Result Value Ref Range   WBC 8.4 4.0 - 10.5 K/uL   RBC 2.57 (L) 3.87 - 5.11 MIL/uL   Hemoglobin 7.5 (L) 12.0 - 15.0 g/dL   HCT 22.9 (L) 36.0 - 46.0 %   MCV 89.1 78.0 - 100.0 fL   MCH 29.2 26.0 - 34.0 pg   MCHC 32.8 30.0 - 36.0 g/dL   RDW 14.9 11.5 - 15.5 %   Platelets 204 150 - 400 K/uL  Basic metabolic panel     Status: Abnormal   Collection Time: 06/07/15  5:30 AM  Result Value Ref Range   Sodium 144 135 -  145 mmol/L   Potassium 3.3 (L) 3.5 - 5.1 mmol/L   Chloride 110 101 - 111 mmol/L   CO2 27 22 - 32 mmol/L   Glucose, Bld 117 (H) 65 - 99 mg/dL   BUN 8 6 - 20 mg/dL   Creatinine, Ser 0.92 0.44 - 1.00 mg/dL   Calcium 8.4 (L) 8.9 - 10.3 mg/dL   GFR calc non Af Amer >60 >60 mL/min   GFR calc Af Amer >60 >60 mL/min    Comment: (NOTE) The eGFR has been calculated using the CKD EPI equation. This calculation has not been validated in all clinical situations. eGFR's persistently <60 mL/min signify possible Chronic Kidney Disease.    Anion gap 7 5 - 15    Imaging / Studies: No results  found.  Medications / Allergies: per chart  Antibiotics: Anti-infectives    Start     Dose/Rate Route Frequency Ordered Stop   06/05/15 0200  cefoTEtan (CEFOTAN) 2 g in dextrose 5 % 50 mL IVPB     2 g 100 mL/hr over 30 Minutes Intravenous Every 12 hours 06/04/15 1702 06/05/15 0222   06/04/15 1044  clindamycin (CLEOCIN) 900 mg, gentamicin (GARAMYCIN) 240 mg in sodium chloride 0.9 % 1,000 mL for intraperitoneal lavage  Status:  Discontinued       As needed 06/04/15 1044 06/04/15 1545   06/04/15 0613  cefoTEtan (CEFOTAN) 2 g in dextrose 5 % 50 mL IVPB     2 g 100 mL/hr over 30 Minutes Intravenous On call to O.R. 06/04/15 9826 06/04/15 1345   06/03/15 1400  clindamycin (CLEOCIN) 900 mg, gentamicin (GARAMYCIN) 240 mg in sodium chloride 0.9 % 1,000 mL for intraperitoneal lavage  Status:  Discontinued    Comments:  Pharmacy may adjust dosing strength, schedule, rate of infusion, etc as needed to optimize therapy    Intraperitoneal To Surgery 06/03/15 1351 06/04/15 1703        Note: Portions of this report may have been transcribed using voice recognition software. Every effort was made to ensure accuracy; however, inadvertent computerized transcription errors may be present.   Any transcriptional errors that result from this process are unintentional.     Adin [name], M.D., F.A.C.S. Gastrointestinal and Minimally Invasive Surgery [facility] [address], [place], [place] [address] [phone] Main / Paging   06/07/2015  CARE TEAM:  PCP: Cathlean Cower, MD  Outpatient Care Team: Patient Care Team: Biagio Borg, MD as PCP - General  Inpatient Treatment Team: Treatment Team: Attending Provider: Michael Boston, MD; Technician: Marguarite Arbour, NT; Technician: Resa Miner Spaugh, NT; Technician: Coralie Carpen, NT; Technician: Sueanne Margarita, NT; Registered Nurse: Arnold Long, RN; Respiratory Therapist: Delaney Meigs, RRT

## 2015-06-07 NOTE — Consult Note (Addendum)
WOC ostomy consult note Stoma type/location: RLQ diverting loop ileostomy Stomal assessment/size: 1 and 3/4 oval with functional os at 6 o'clock; dusky, budded and edematous Peristomal assessment: intact, clear Treatment options for stomal/peristomal skin: skin barrier ring Output thin brown effluent Ostomy pouching: 2pc., 2 and 1/4 inch with skin barrier ring Education provided: Extended session with patient and sister.  Taught pouching system removal, stoma sizing, GI A&P, stoma characteristics, pouch characteristics.  Demonstrated pouching system removal, also pouching system placement.  Lock and Roll closure taught/demonstrated x2; patient able to give return demonstration with cueing.  Demonstrated cleaning of tail closure of pouch with toilet paper wicks.  Recommend HHRN for continued support and teaching after discharge.  If you agree, please order. Enrolled patient in [place] program: Yes [facility] nursing team will follow, and will remain available to this patient, the nursing and medical teams.  Please re-consult if needed. Thanks, Maudie Flakes, MSN, RN, [place], Arther Abbott  Pager# [phone]

## 2015-06-07 NOTE — Care Management Note (Signed)
Case Management Note  Patient Details  Name: Brianna Walker MRN: [id number] Date of Birth: [birth year]  Subjective/Objective:  S/p LAR, diverting loop ileostomy, resection of GIST tumor, lysis of adhesions, Posterior rectovaginal repair. Resection of cystic mass near small bowel with serosal repair.                  Action/Plan: Discharge planning, spoke with patient at beside. Chose [facility] for [facility] services, contacted [facility] for referral. Awaiting teaching from [facility] nurse regarding ostomy care.   Expected Discharge Date:  06/04/15               Expected Discharge Plan:  [place]  In-House Referral:  NA  Discharge planning Services  CM Consult  Post Acute Care Choice:  Home Health Choice offered to:  Patient  DME Arranged:  N/A DME Agency:  NA  HH Arranged:  RN, Disease Management [place] Agency:  [facility]  Status of Service:  Completed, signed off  Medicare Important Message Given:    Date Medicare IM Given:    Medicare IM give by:    Date Additional Medicare IM Given:    Additional Medicare Important Message give by:     If discussed at [place] of Stay Meetings, dates discussed:    Additional Comments:  Guadalupe Maple, RN 06/07/2015, 10:40 AM [phone]

## 2015-06-07 NOTE — Care Management Important Message (Signed)
Important Message  Patient Details  Name: Brianna Walker MRN: [id number] Date of Birth: [birth year]   Medicare Important Message Given:  Yes    Camillo Flaming 06/07/2015, 11:58 AMImportant Message  Patient Details  Name: Brianna Walker MRN: [id number] Date of Birth: 1946/01/17   Medicare Important Message Given:  Yes    Camillo Flaming 06/07/2015, 11:58 AM

## 2015-06-08 LAB — CREATININE, SERUM
CREATININE: 0.99 mg/dL (ref 0.44–1.00)
GFR, EST NON AFRICAN AMERICAN: 58 mL/min — AB (ref >60)

## 2015-06-08 LAB — POTASSIUM: POTASSIUM: 4 mmol/L (ref 3.5–5.1)

## 2015-06-08 LAB — HEMOGLOBIN: HEMOGLOBIN: 8.4 g/dL — AB (ref 12.0–15.0)

## 2015-06-08 MED ORDER — MAGNESIUM SULFATE 4 GM/100ML IV SOLN
4.0000 g | Freq: Once | INTRAVENOUS | Status: AC
  Administered 2015-06-08: 4 g via INTRAVENOUS
  Filled 2015-06-08: qty 100

## 2015-06-08 MED ORDER — SODIUM CHLORIDE 0.9% FLUSH
10.0000 mL | INTRAVENOUS | Status: DC | PRN
  Administered 2015-06-09 – 2015-06-18 (×7): 10 mL
  Filled 2015-06-08 (×7): qty 40

## 2015-06-08 MED ORDER — LACTATED RINGERS IV BOLUS (SEPSIS)
2000.0000 mL | Freq: Once | INTRAVENOUS | Status: AC
  Administered 2015-06-08: 2000 mL via INTRAVENOUS

## 2015-06-08 MED ORDER — PANTOPRAZOLE SODIUM 40 MG PO TBEC
40.0000 mg | DELAYED_RELEASE_TABLET | Freq: Every day | ORAL | Status: DC
  Administered 2015-06-08 – 2015-06-17 (×10): 40 mg via ORAL
  Filled 2015-06-08 (×12): qty 1

## 2015-06-08 NOTE — Progress Notes (Signed)
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 Resection of cystic mass near small bowel with serosal repair.  Surgeon(s): Michael Boston, MD Leighton Ruff, MD - Assist    Plan:  -bolus IVF & follow to see if can rehydrate -place PICC & set up [facility] IVF qMWF to avoid dehydration since orthostatic & fait PO intake to avoid ARF/readmission -solid diet -replacing low K -replace low Mg -OSTOMY CARE / TRAINING -prob d/c drain at d/c home -VTE prophylaxis- SCDs, etc -mobilize as tolerated to help recovery  D/C patient from hospital when patient meets criteria (anticipate in 1-2 day(s)):  Tolerating oral intake well  Ambulating well Adequate pain control without IV medications Urinating  Having flatus Disposition planning in place - esp. HH arranged for ileostomy training/care & IVF boluses at home   Adin [name], M.D., F.A.C.S. Gastrointestinal and Minimally Invasive Surgery [facility] [address] [place], [place] [address] [phone] Main / Paging   06/08/2015  Subjective:  Feeling weaker Walking in hallways Taking some PO Ostomy bag changed & training begun  Objective:  Vital signs:  Filed Vitals:   06/07/15 1418 06/07/15 2100  06/08/15 0500 06/08/15 0505  BP: 135/78 140/77  140/74  Pulse: 99 77  83  Temp: 98.3 F (36.8 C) 98.1 F (36.7 C)  98 F (36.7 C)  TempSrc: Oral Oral  Oral  Resp: _0 Height:      Weight:   103.8 kg (228 lb 13.4 oz)   SpO2: 99% 100%      Last BM Date: 06/07/15  Intake/Output   Yesterday:  02/06 0701 - 02/07 0700 In: 480 [P.O.:480] Out: 1580 [Urine:1350; Drains:100; Stool:130] This shift:     Bowel function:  Flatus: y  BM: y scant thick effluent in bag  Drain: serodanguinous  Physical Exam:  General: Pt awake/alert/oriented x4 in no acute distress.  Tired but not toxic Eyes: PERRL, normal EOM.  Sclera clear.  No icterus Neuro: CN II-XII intact w/o focal sensory/motor deficits. Lymph: No head/neck/groin lymphadenopathy Psych:  No delerium/psychosis/paranoia HENT: Normocephalic, Mucus membranes moist.  No thrush Neck: Supple, No tracheal deviation Chest: No chest wall pain w good excursion CV:  Pulses intact.  Regular rhythm MS: Normal AROM mjr joints.  No obvious deformity Abdomen: Soft.  Nondistended.  Mildly tender at incisions only.  No evidence of peritonitis.  No incarcerated hernias.  Gas/thick succus in RUQ ileostomy bag.  Mild duskiness at ileostomy but stable.  No necrosis Ext:  SCDs BLE.  No mjr edema.  No cyanosis Skin: No petechiae / purpura  Results:   Labs: Results for orders placed or performed during the hospital encounter of 06/04/15 (from the past 48 hour(s))  CBC     Status: Abnormal   Collection Time: 06/07/15  5:30 AM  Result Value Ref Range  WBC 8.4 4.0 - 10.5 K/uL   RBC 2.57 (L) 3.87 - 5.11 MIL/uL   Hemoglobin 7.5 (L) 12.0 - 15.0 g/dL   HCT 22.9 (L) 36.0 - 46.0 %   MCV 89.1 78.0 - 100.0 fL   MCH 29.2 26.0 - 34.0 pg   MCHC 32.8 30.0 - 36.0 g/dL   RDW 14.9 11.5 - 15.5 %   Platelets 204 150 - 400 K/uL  Basic metabolic panel     Status: Abnormal   Collection Time: 06/07/15  5:30 AM  Result Value Ref Range   Sodium 144 135 -  145 mmol/L   Potassium 3.3 (L) 3.5 - 5.1 mmol/L   Chloride 110 101 - 111 mmol/L   CO2 27 22 - 32 mmol/L   Glucose, Bld 117 (H) 65 - 99 mg/dL   BUN 8 6 - 20 mg/dL   Creatinine, Ser 0.92 0.44 - 1.00 mg/dL   Calcium 8.4 (L) 8.9 - 10.3 mg/dL   GFR calc non Af Amer >60 >60 mL/min   GFR calc Af Amer >60 >60 mL/min    Comment: (NOTE) The eGFR has been calculated using the CKD EPI equation. This calculation has not been validated in all clinical situations. eGFR's persistently <60 mL/min signify possible Chronic Kidney Disease.    Anion gap 7 5 - 15  Magnesium     Status: Abnormal   Collection Time: 06/07/15  5:30 AM  Result Value Ref Range   Magnesium 1.6 (L) 1.7 - 2.4 mg/dL  Potassium     Status: None   Collection Time: 06/08/15  5:06 AM  Result Value Ref Range   Potassium 4.0 3.5 - 5.1 mmol/L    Comment: RESULT REPEATED AND VERIFIED DELTA CHECK NOTED NO VISIBLE HEMOLYSIS   Hemoglobin     Status: Abnormal   Collection Time: 06/08/15  5:06 AM  Result Value Ref Range   Hemoglobin 8.4 (L) 12.0 - 15.0 g/dL  Creatinine, serum     Status: Abnormal   Collection Time: 06/08/15  5:06 AM  Result Value Ref Range   Creatinine, Ser 0.99 0.44 - 1.00 mg/dL   GFR calc non Af Amer 58 (L) >60 mL/min   GFR calc Af Amer >60 >60 mL/min    Comment: (NOTE) The eGFR has been calculated using the CKD EPI equation. This calculation has not been validated in all clinical situations. eGFR's persistently <60 mL/min signify possible Chronic Kidney Disease.     Imaging / Studies: No results found.  Medications / Allergies: per chart  Antibiotics: Anti-infectives    Start     Dose/Rate Route Frequency Ordered Stop   06/05/15 0200  cefoTEtan (CEFOTAN) 2 g in dextrose 5 % 50 mL IVPB     2 g 100 mL/hr over 30 Minutes Intravenous Every 12 hours 06/04/15 1702 06/05/15 0222   06/04/15 1044  clindamycin (CLEOCIN) 900 mg, gentamicin (GARAMYCIN) 240 mg in sodium chloride 0.9 % 1,000 mL for intraperitoneal  lavage  Status:  Discontinued       As needed 06/04/15 1044 06/04/15 1545   06/04/15 0613  cefoTEtan (CEFOTAN) 2 g in dextrose 5 % 50 mL IVPB     2 g 100 mL/hr over 30 Minutes Intravenous On call to O.R. 06/04/15 9562 06/04/15 1345   06/03/15 1400  clindamycin (CLEOCIN) 900 mg, gentamicin (GARAMYCIN) 240 mg in sodium chloride 0.9 % 1,000 mL for intraperitoneal lavage  Status:  Discontinued    Comments:  Pharmacy may adjust dosing strength, schedule, rate of  infusion, etc as needed to optimize therapy    Intraperitoneal To Surgery 06/03/15 1351 06/04/15 1703        Note: Portions of this report may have been transcribed using voice recognition software. Every effort was made to ensure accuracy; however, inadvertent computerized transcription errors may be present.   Any transcriptional errors that result from this process are unintentional.     Adin [name], M.D., F.A.C.S. Gastrointestinal and Minimally Invasive Surgery [facility] [address], [place], [place] [address] [phone] Main / Paging   06/08/2015  CARE TEAM:  PCP: Cathlean Cower, MD  Outpatient Care Team: Patient Care Team: Biagio Borg, MD as PCP - General  Inpatient Treatment Team: Treatment Team: Attending Provider: Michael Boston, MD; Technician: Marguarite Arbour, NT; Technician: Resa Miner Spaugh, NT; Technician: Coralie Carpen, NT; Technician: Sueanne Margarita, NT; Registered Nurse: Arnold Long, RN; Registered Nurse: Charlena Cross, RN

## 2015-06-08 NOTE — Progress Notes (Signed)
Peripherally Inserted Central Catheter/Midline Placement  The IV Nurse has discussed with the patient and/or persons authorized to consent for the patient, the purpose of this procedure and the potential benefits and risks involved with this procedure.  The benefits include less needle sticks, lab draws from the catheter and patient may be discharged home with the catheter.  Risks include, but not limited to, infection, bleeding, blood clot (thrombus formation), and puncture of an artery; nerve damage and irregular heat beat.  Alternatives to this procedure were also discussed.  PICC/Midline Placement Documentation        Neil Errickson, Nicolette Bang 06/08/2015, 12:10 PM

## 2015-06-09 LAB — CBC
HEMATOCRIT: 26.5 % — AB (ref 36.0–46.0)
HEMOGLOBIN: 8.7 g/dL — AB (ref 12.0–15.0)
MCH: 29.2 pg (ref 26.0–34.0)
MCHC: 32.8 g/dL (ref 30.0–36.0)
MCV: 88.9 fL (ref 78.0–100.0)
Platelets: 297 10*3/uL (ref 150–400)
RBC: 2.98 MIL/uL — AB (ref 3.87–5.11)
RDW: 15.1 % (ref 11.5–15.5)
WBC: 11.6 10*3/uL — AB (ref 4.0–10.5)

## 2015-06-09 LAB — BASIC METABOLIC PANEL
ANION GAP: 8 (ref 5–15)
BUN: 8 mg/dL (ref 6–20)
CHLORIDE: 104 mmol/L (ref 101–111)
CO2: 28 mmol/L (ref 22–32)
Calcium: 9 mg/dL (ref 8.9–10.3)
Creatinine, Ser: 0.92 mg/dL (ref 0.44–1.00)
GFR calc Af Amer: >60 mL/min (ref >60)
Glucose, Bld: 115 mg/dL — ABNORMAL HIGH (ref 65–99)
POTASSIUM: 4 mmol/L (ref 3.5–5.1)
SODIUM: 140 mmol/L (ref 135–145)

## 2015-06-09 MED ORDER — POTASSIUM CHLORIDE IN NACL 40-0.9 MEQ/L-% IV SOLN
INTRAVENOUS | Status: DC
  Administered 2015-06-09 – 2015-06-13 (×9): 100 mL/h via INTRAVENOUS
  Administered 2015-06-13 – 2015-06-14 (×2): 50 mL/h via INTRAVENOUS
  Filled 2015-06-09 (×17): qty 1000

## 2015-06-09 MED ORDER — ONDANSETRON 4 MG PO TBDP
4.0000 mg | ORAL_TABLET | Freq: Four times a day (QID) | ORAL | Status: DC | PRN
  Administered 2015-06-15: 8 mg via ORAL
  Administered 2015-06-17: 4 mg via ORAL
  Filled 2015-06-09: qty 2
  Filled 2015-06-09: qty 1

## 2015-06-09 MED ORDER — NYSTATIN 100000 UNIT/GM EX POWD
Freq: Two times a day (BID) | CUTANEOUS | Status: DC
  Administered 2015-06-10 – 2015-06-18 (×5): via TOPICAL
  Filled 2015-06-09: qty 15

## 2015-06-09 MED ORDER — HYDROMORPHONE HCL 1 MG/ML IJ SOLN
0.5000 mg | INTRAMUSCULAR | Status: DC | PRN
  Administered 2015-06-09 – 2015-06-10 (×5): 0.5 mg via INTRAVENOUS
  Administered 2015-06-10 – 2015-06-15 (×16): 1 mg via INTRAVENOUS
  Filled 2015-06-09 (×22): qty 1

## 2015-06-09 MED ORDER — METOCLOPRAMIDE HCL 5 MG/ML IJ SOLN
5.0000 mg | Freq: Four times a day (QID) | INTRAMUSCULAR | Status: DC | PRN
  Administered 2015-06-10: 5 mg via INTRAVENOUS
  Administered 2015-06-11 – 2015-06-18 (×5): 10 mg via INTRAVENOUS
  Filled 2015-06-09 (×7): qty 2

## 2015-06-09 NOTE — Evaluation (Signed)
Physical Therapy Evaluation Patient Details Name: Brianna Walker MRN: [id number] DOB: 10-Sep-1945 Today's Date: 06/09/2015   History of Present Illness  70 yo female admitted 06/04/15 with  Rectal mass/GIST (gastrointestinal stroma tumor) of distal rectum. S/p loop ileostomy 06/04/2015.   Clinical Impression  Patient is very pleasant , c/o nausea. States that she just ambulated to desk. Patient did ambulate to BR and was assisted back into bed. Patient  Will benefit from PT while in acute care to address problems listed in note below to  Dc to home.   Follow Up Recommendations No PT follow up;Supervision - Intermittent    Equipment Recommendations  None recommended by PT (may need a RW.)    Recommendations for Other Services       Precautions / Restrictions Precautions Precautions: Fall Precaution Comments: drains      Mobility  Bed Mobility Overal bed mobility: Needs Assistance Bed Mobility: Sit to Supine       Sit to supine: Min assist   General bed mobility comments: assist with legs onto bed  Transfers Overall transfer level: Needs assistance Equipment used: Rolling walker (2 wheeled) Transfers: Sit to/from Stand Sit to Stand: Modified independent (Device/Increase time)            Ambulation/Gait Ambulation/Gait assistance: Supervision Ambulation Distance (Feet): 20 Feet (x2) Assistive device: Rolling walker (2 wheeled) Gait Pattern/deviations: Step-through pattern     General Gait Details: gait is steady and turns well. manevres in bathroom well with RW.  Stairs            Wheelchair Mobility    Modified Rankin (Stroke Patients Only)       Balance                                             Pertinent Vitals/Pain Pain Assessment: 0-10 Pain Score: 3  Pain Location: abdomen Pain Descriptors / Indicators: Discomfort Pain Intervention(s): Monitored during session    Home Living Family/patient expects to be discharged to::  Private residence Living Arrangements: Other relatives Available Help at Discharge: Family Type of Home: House Home Access: Stairs to enter   Technical brewer of Steps: 1 Home Layout: Two level;Bed/bath upstairs Home Equipment: None      Prior Function Level of Independence: Independent               Hand Dominance        Extremity/Trunk Assessment   Upper Extremity Assessment: Overall WFL for tasks assessed           Lower Extremity Assessment: Overall WFL for tasks assessed         Communication   Communication: No difficulties  Cognition Arousal/Alertness: Awake/alert Behavior During Therapy: WFL for tasks assessed/performed Overall Cognitive Status: Within Functional Limits for tasks assessed                      General Comments      Exercises        Assessment/Plan    PT Assessment Patient needs continued PT services  PT Diagnosis Generalized weakness   PT Problem List Decreased activity tolerance;Decreased mobility  PT Treatment Interventions DME instruction;Gait training;Stair training;Functional mobility training;Therapeutic activities;Patient/family education   PT Goals (Current goals can be found in the Care Plan section) Acute Rehab PT Goals Patient Stated Goal: to feel better, go home PT Goal Formulation: With  patient/family Time For Goal Achievement: 06/23/15 Potential to Achieve Goals: Good    Frequency Min 3X/week   Barriers to discharge        Co-evaluation               End of Session   Activity Tolerance: Patient tolerated treatment well;Other (comment) Patient left: in bed;with call bell/phone within reach;with family/visitor present Nurse Communication: Mobility status         Time: 1450-1500 PT Time Calculation (min) (ACUTE ONLY): 10 min   Charges:   PT Evaluation $PT Eval Low Complexity: 1 Procedure     PT G CodesClaretha Cooper 06/09/2015, 3:50 PM Tresa Endo  PT [phone]

## 2015-06-09 NOTE — Progress Notes (Addendum)
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 Resection of cystic mass near small bowel with serosal repair.  Surgeon(s): Michael Boston, MD Leighton Ruff, MD - Assist  Assessment  ILEUS   Plan:  -IVF  -set up [facility] IVF qMWF to avoid dehydration since orthostatic & fait PO intake to avoid ARF/readmission -back down to clears -check lytes -OSTOMY CARE / TRAINING -prob d/c drain at d/c home -VTE prophylaxis- SCDs, etc -mobilize as tolerated to help recovery  D/C patient from hospital when patient meets criteria (anticipate in 1-2 day(s)):  Tolerating oral intake well Ambulating well Adequate pain control without IV medications Urinating  Having flatus Disposition planning in place - esp. HH arranged for ileostomy training/care & IVF boluses at home   Adin [name], M.D., F.A.C.S. Gastrointestinal and Minimally Invasive Surgery [facility] [address] [place], [place] [address] [phone] Main / Paging   06/09/2015  Subjective:  Feeling tired No abd pain No anorectal pain N/V with pills last night    Objective:  Vital signs:  Filed Vitals:   06/08/15 0505 06/08/15 1429 06/08/15 2105 06/09/15 0440  BP: 140/74 148/72 148/96 150/90  Pulse: 83 100 112 109  Temp:  98 F (36.7 C) 98.4 F (36.9 C) 99.8 F (37.7 C) 99.1 F (37.3 C)  TempSrc: Oral Oral Oral Oral  Resp: '18 17 18 18  ' Height:      Weight:    104.2 kg (229 lb 11.5 oz)  SpO2:  100% 98% 100%    Last BM Date: 06/08/15  Intake/Output   Yesterday:  02/07 0701 - 02/08 0700 In: 180 [P.O.:180] Out: 4770 [Urine:4475; Drains:245; Stool:50] This shift:     Bowel function:  Flatus: y  BM: scant thick effluent in bag  Drain: serosanguinous  Physical Exam:  General: Pt awake/alert/oriented x4 in no acute distress.  Tired but not toxic Eyes: PERRL, normal EOM.  Sclera clear.  No icterus Neuro: CN II-XII intact w/o focal sensory/motor deficits. Lymph: No head/neck/groin lymphadenopathy Psych:  No delerium/psychosis/paranoia HENT: Normocephalic, Mucus membranes moist.  No thrush Neck: Supple, No tracheal deviation Chest: No chest wall pain w good excursion CV:  Pulses intact.  Regular rhythm MS: Normal AROM mjr joints.  No obvious deformity Abdomen: Soft but morbidly obese.  Mildly distended.  Nontender.  No guarding.  No evidence of peritonitis.  No incarcerated hernias.  Mild duskiness at ileostomy but stable.  No necrosis Ext:  SCDs BLE.  No mjr edema.  No cyanosis Skin: No petechiae / purpura  Results:   Labs: Results for orders placed or performed during the hospital encounter of 06/04/15 (from the past 48 hour(s))  Potassium     Status: None   Collection Time: 06/08/15  5:06 AM  Result Value Ref Range   Potassium 4.0 3.5 - 5.1 mmol/L    Comment: RESULT REPEATED AND VERIFIED  DELTA CHECK NOTED NO VISIBLE HEMOLYSIS   Hemoglobin     Status: Abnormal   Collection Time: 06/08/15  5:06 AM  Result Value Ref Range   Hemoglobin 8.4 (L) 12.0 - 15.0 g/dL  Creatinine, serum     Status: Abnormal   Collection Time: 06/08/15  5:06 AM  Result Value Ref Range   Creatinine, Ser 0.99 0.44 - 1.00 mg/dL   GFR calc non Af Amer 58 (L) >60 mL/min   GFR calc Af Amer >60 >60 mL/min     Comment: (NOTE) The eGFR has been calculated using the CKD EPI equation. This calculation has not been validated in all clinical situations. eGFR's persistently <60 mL/min signify possible Chronic Kidney Disease.     Imaging / Studies: No results found.  Medications / Allergies: per chart  Antibiotics: Anti-infectives    Start     Dose/Rate Route Frequency Ordered Stop   06/05/15 0200  cefoTEtan (CEFOTAN) 2 g in dextrose 5 % 50 mL IVPB     2 g 100 mL/hr over 30 Minutes Intravenous Every 12 hours 06/04/15 1702 06/05/15 0222   06/04/15 1044  clindamycin (CLEOCIN) 900 mg, gentamicin (GARAMYCIN) 240 mg in sodium chloride 0.9 % 1,000 mL for intraperitoneal lavage  Status:  Discontinued       As needed 06/04/15 1044 06/04/15 1545   06/04/15 0613  cefoTEtan (CEFOTAN) 2 g in dextrose 5 % 50 mL IVPB     2 g 100 mL/hr over 30 Minutes Intravenous On call to O.R. 06/04/15 5927 06/04/15 1345   06/03/15 1400  clindamycin (CLEOCIN) 900 mg, gentamicin (GARAMYCIN) 240 mg in sodium chloride 0.9 % 1,000 mL for intraperitoneal lavage  Status:  Discontinued    Comments:  Pharmacy may adjust dosing strength, schedule, rate of infusion, etc as needed to optimize therapy    Intraperitoneal To Surgery 06/03/15 1351 06/04/15 1703        Note: Portions of this report may have been transcribed using voice recognition software. Every effort was made to ensure accuracy; however, inadvertent computerized transcription errors may be present.   Any transcriptional errors that result from this process are unintentional.     Adin [name], M.D., F.A.C.S. Gastrointestinal and Minimally Invasive Surgery [facility] [address] [place] [address] [place], [place] [address] [phone] Main / Paging   06/09/2015  CARE TEAM:  PCP: Cathlean Cower, MD  Outpatient Care Team: Patient Care Team: Biagio Borg, MD as PCP - General  Inpatient Treatment Team: Treatment Team: Attending Provider:  Michael Boston, MD; Technician: Marguarite Arbour, NT; Technician: Tenna Child, NT; Technician: Sueanne Margarita, NT; Registered Nurse: Charlena Cross, RN; Technician: Coralie Carpen, NT; Registered Nurse: Arminda Resides, RN; Consulting Physician: Ladell Pier, MD

## 2015-06-10 LAB — BASIC METABOLIC PANEL
ANION GAP: 11 (ref 5–15)
BUN: 10 mg/dL (ref 6–20)
CHLORIDE: 103 mmol/L (ref 101–111)
CO2: 24 mmol/L (ref 22–32)
Calcium: 8.6 mg/dL — ABNORMAL LOW (ref 8.9–10.3)
Creatinine, Ser: 0.83 mg/dL (ref 0.44–1.00)
GFR calc Af Amer: >60 mL/min (ref >60)
GLUCOSE: 114 mg/dL — AB (ref 65–99)
POTASSIUM: 3.9 mmol/L (ref 3.5–5.1)
Sodium: 138 mmol/L (ref 135–145)

## 2015-06-10 LAB — MAGNESIUM: Magnesium: 1.8 mg/dL (ref 1.7–2.4)

## 2015-06-10 MED ORDER — LACTATED RINGERS IV BOLUS (SEPSIS)
1000.0000 mL | Freq: Once | INTRAVENOUS | Status: AC
  Administered 2015-06-10: 1000 mL via INTRAVENOUS

## 2015-06-10 NOTE — Progress Notes (Signed)
PT Cancellation Note  Patient Details Name: SHALESHA DRUMMER MRN: [id number] DOB: 15-Jul-1945   Cancelled Treatment:    Reason Eval/Treat Not Completed: Medical issues which prohibited therapy;Fatigue/lethargy limiting ability to participate (pt nauseous and fatigued, will follow. )   Philomena Doheny 06/10/2015, 2:33 PM [phone]

## 2015-06-10 NOTE — Progress Notes (Signed)
OT Cancellation Note  Patient Details Name: Brianna Walker MRN: [id number] DOB: 04-17-1946   Cancelled Treatment:    Reason Eval/Treat Not Completed: Other (comment). Pt doesn't feel well and not up to OT at this time.  If schedule permits, I'll check back later today; if not, I'll check back tomorrow.  Jakim Drapeau 06/10/2015, 10:28 AM  Lesle Chris, OTR/L [phone] 06/10/2015

## 2015-06-10 NOTE — Consult Note (Signed)
WOC ostomy follow up Stoma type/location: RLQ loop ileostomy Stomal assessment/size: 1 and 3/8 inch oval with functional os at 6 o'clock, dusky budded and edematous Peristomal assessment: intact, clear Treatment options for stomal/peristomal skin: skin barrier ring Output: brown effluent Ostomy pouching: 2pc.2 and 1/4 inch with skin barrier ring Education provided: Patient has been medicated for nausea and pain, but is alert for pouch change and is observing.  Sister present and observing process, remembers most from session on Monday.  Enrolled patient in [place] Start Discharge program: Yes [place] nursing team will follow, and will remain available to this patient, the nursing, surgical and medical teams.   Thanks, Maudie Flakes, MSN, RN, [place], Arther Abbott  Pager# [phone]

## 2015-06-10 NOTE — Progress Notes (Signed)
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 Resection of cystic mass near small bowel with serosal repair.  Surgeon(s): Michael Boston, MD Leighton Ruff, MD - Assist  Assessment  ILEUS now with vomiting   Plan:  -NPO -NGT -IVF  -set up [facility] IVF qMWF to avoid dehydration since orthostatic & fait PO intake to avoid ARF/readmission -back down to clears -check lytes -OSTOMY CARE / TRAINING -prob d/c drain at d/c home -VTE prophylaxis- SCDs, etc -mobilize as tolerated to help recovery -f/u pathology - most likely will get gleevec post-adjuvant for 3 years total per Dr Benay Spice -CT scan if worse  D/C patient from hospital when patient meets criteria (anticipate in ?? day(s)):  Tolerating oral intake well Ambulating well Adequate pain control without IV medications Urinating  Having flatus Disposition planning in place - esp. HH arranged for ileostomy training/care & IVF boluses at home   Adin [name], M.D., F.A.C.S. Gastrointestinal and Minimally Invasive Surgery [facility] [address] [place], [place] [address] [phone] Main / Paging   06/10/2015  Subjective:  Walking well in hallways No anorectal pain N/V last night feels better but some RUQ pain near  ileostomy    Objective:  Vital signs:  Filed Vitals:   06/09/15 0440 06/09/15 1407 06/09/15 2025 06/10/15 0504  BP: 150/90 162/86 158/80 142/60  Pulse: 109 113 106 113  Temp: 99.1 F (37.3 C) 100 F (37.8 C) 97.8 F (36.6 C) 98.8 F (37.1 C)  TempSrc: Oral Oral Axillary Axillary  Resp: _0 Height:      Weight: 104.2 kg (229 lb 11.5 oz)   104.3 kg (229 lb 15 oz)  SpO2: 100%   98%    Last BM Date: 06/09/15  Intake/Output   Yesterday:  02/08 0701 - 02/09 0700 In: 2263.3 [P.O.:300; I.V.:1963.3] Out: 4026 [MLVXB:4129; Emesis/NG output:1; Drains:370; Stool:80] This shift:     Bowel function:  Flatus: scant  BM: scant thick effluent & gas in bag  Drain: serosanguinous  Physical Exam:  General: Pt awake/alert/oriented x4 in no acute distress.  Tired but not toxic Eyes: PERRL, normal EOM.  Sclera clear.  No icterus Neuro: CN II-XII intact w/o focal sensory/motor deficits. Lymph: No head/neck/groin lymphadenopathy Psych:  No delerium/psychosis/paranoia HENT: Normocephalic, Mucus membranes moist.  No thrush Neck: Supple, No tracheal deviation Chest: No chest wall pain w good excursion CV:  Pulses intact.  Regular rhythm MS: Normal AROM mjr joints.  No obvious deformity Abdomen: Soft but morbidly obese.  Mildly distended.  Mod RUQ TTP just above ileostomy.  No fluctuance/cellultis.  No guarding.  No evidence of peritonitis.  No incarcerated hernias.  Mild duskiness at ileostomy but stable.  No necrosis Ext:  SCDs BLE.  No mjr edema.  No cyanosis Skin: No petechiae / purpura  Results:   Labs: Results for orders placed or performed  during the hospital encounter of 06/04/15 (from the past 48 hour(s))  Basic metabolic panel     Status: Abnormal   Collection Time: 06/09/15  9:40 AM  Result Value Ref Range   Sodium 140 135 - 145 mmol/L   Potassium 4.0 3.5 - 5.1 mmol/L   Chloride 104 101 - 111 mmol/L   CO2 28 22 - 32 mmol/L   Glucose, Bld 115 (H) 65 - 99 mg/dL    BUN 8 6 - 20 mg/dL   Creatinine, Ser 0.92 0.44 - 1.00 mg/dL   Calcium 9.0 8.9 - 10.3 mg/dL   GFR calc non Af Amer >60 >60 mL/min   GFR calc Af Amer >60 >60 mL/min    Comment: (NOTE) The eGFR has been calculated using the CKD EPI equation. This calculation has not been validated in all clinical situations. eGFR's persistently <60 mL/min signify possible Chronic Kidney Disease.    Anion gap 8 5 - 15  CBC     Status: Abnormal   Collection Time: 06/09/15  9:40 AM  Result Value Ref Range   WBC 11.6 (H) 4.0 - 10.5 K/uL   RBC 2.98 (L) 3.87 - 5.11 MIL/uL   Hemoglobin 8.7 (L) 12.0 - 15.0 g/dL   HCT 26.5 (L) 36.0 - 46.0 %   MCV 88.9 78.0 - 100.0 fL   MCH 29.2 26.0 - 34.0 pg   MCHC 32.8 30.0 - 36.0 g/dL   RDW 15.1 11.5 - 15.5 %   Platelets 297 150 - 400 K/uL    Imaging / Studies: No results found.  Medications / Allergies: per chart  Antibiotics: Anti-infectives    Start     Dose/Rate Route Frequency Ordered Stop   06/05/15 0200  cefoTEtan (CEFOTAN) 2 g in dextrose 5 % 50 mL IVPB     2 g 100 mL/hr over 30 Minutes Intravenous Every 12 hours 06/04/15 1702 06/05/15 0222   06/04/15 1044  clindamycin (CLEOCIN) 900 mg, gentamicin (GARAMYCIN) 240 mg in sodium chloride 0.9 % 1,000 mL for intraperitoneal lavage  Status:  Discontinued       As needed 06/04/15 1044 06/04/15 1545   06/04/15 0613  cefoTEtan (CEFOTAN) 2 g in dextrose 5 % 50 mL IVPB     2 g 100 mL/hr over 30 Minutes Intravenous On call to O.R. 06/04/15 5462 06/04/15 1345   06/03/15 1400  clindamycin (CLEOCIN) 900 mg, gentamicin (GARAMYCIN) 240 mg in sodium chloride 0.9 % 1,000 mL for intraperitoneal lavage  Status:  Discontinued    Comments:  Pharmacy may adjust dosing strength, schedule, rate of infusion, etc as needed to optimize therapy    Intraperitoneal To Surgery 06/03/15 1351 06/04/15 1703        Note: Portions of this report may have been transcribed using voice recognition software. Every effort was made to  ensure accuracy; however, inadvertent computerized transcription errors may be present.   Any transcriptional errors that result from this process are unintentional.     Adin [name], M.D., F.A.C.S. Gastrointestinal and Minimally Invasive Surgery [facility] [address] [place], [place] [address] [phone] Main / Paging   06/10/2015  CARE TEAM:  PCP: Cathlean Cower, MD  Outpatient Care Team: Patient Care Team: Biagio Borg, MD as PCP - General  Inpatient Treatment Team: Treatment Team: Attending Provider: Michael Boston, MD; Technician: Marguarite Arbour, NT; Technician: Tenna Child, [place]; Registered Nurse: Charlena Cross, RN; Technician: Coralie Carpen, NT; Consulting Physician:  Ladell Pier, MD; Occupational Therapist: Lesle Chris, OT; Registered Nurse: Guadalupe Dawn, RN; Physical Therapist: Lucile Crater, PT

## 2015-06-11 LAB — CREATININE, SERUM
CREATININE: 1.2 mg/dL — AB (ref 0.44–1.00)
GFR calc non Af Amer: 46 mL/min — ABNORMAL LOW (ref >60)
GFR, EST AFRICAN AMERICAN: 53 mL/min — AB (ref >60)

## 2015-06-11 NOTE — Evaluation (Signed)
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    Clinical Impression   This 70 year old female was admitted for the above surgery. She was independent with adls prior to admission and will have sister's help upon discharge. Will follow in acute to further educate on bathroom transfers/DME options for tub if interested    Follow Up Recommendations  Supervision/Assistance - 24 hour    Equipment Recommendations  3 in 1 bedside comode    Recommendations for Other Services       Precautions / Restrictions Precautions Precautions: Fall Restrictions Weight Bearing Restrictions: No      Mobility Bed Mobility           Sit to supine: Supervision;HOB elevated   General bed mobility comments: HOB fully raised.  Educated on rolling if bed is flat  Transfers Overall transfer level: Needs assistance Equipment used: Rolling walker (2 wheeled)   Sit to Stand: Supervision         General transfer comment: cues for ue placement    Balance                                            ADL Overall ADL's : Needs assistance/impaired             Lower Body Bathing: Moderate assistance;Sit to/from stand       Lower Body Dressing: Maximal assistance;Sit to/from stand   Toilet Transfer: Min guard;Stand-pivot;RW (chair)             General ADL Comments: Pt can perform UB adls with min A to guard lines;tubes.  Sister will assist with adls.  Pt has reacher:  explained uses if she wants to use this.  Pt has a high commode with nothing to push up from; she would like a 3;1 to go by the bedside and this can be used over toilet.  Pt has a tub and may sponge bathe initially--educated on seat, which is not covered by insurance   Touched base about pacing/rest breaks and  balancing activities to increase endurance     Vision     Perception     Praxis      Pertinent Vitals/Pain       Hand Dominance     Extremity/Trunk Assessment             Communication Communication Communication: No difficulties   Cognition                           General Comments       Exercises       Shoulder Instructions      Home Living Family/patient expects to be discharged to:: Private residence Living Arrangements: Other relatives Available Help at Discharge: Family Type of Home: House             Bathroom Shower/Tub: Teaching laboratory technician Toilet: Handicapped height     Home Equipment: None          Prior Functioning/Environment Level of Independence: Independent        Comments: sister is caregiver    OT Diagnosis: Acute pain   OT Problem List: Decreased strength;Pain;Decreased knowledge of use of  DME or AE   OT Treatment/Interventions: Self-care/ADL training;DME and/or AE instruction;Patient/family education    OT Goals(Current goals can be found in the care plan section) Acute Rehab OT Goals Patient Stated Goal: home OT Goal Formulation: With patient Time For Goal Achievement: 06/18/15 Potential to Achieve Goals: Good ADL Goals Pt Will Transfer to Toilet: with modified independence;ambulating;bedside commode (vs high commode with only RW to use for support)  OT Frequency: Min 2X/week   Barriers to D/C:            Co-evaluation              End of Session    Activity Tolerance: Patient tolerated treatment well Patient left: in chair;with call bell/phone within reach   Time: [id number] OT Time Calculation (min): 17 min Charges:  OT General Charges $OT Visit: 1 Procedure OT Evaluation $OT Eval Low Complexity: 1 Procedure G-Codes:    Catelyn Friel 07-08-2015, 3:08 PM   Lesle Chris, OTR/L [phone] 2015-07-08

## 2015-06-11 NOTE — Progress Notes (Signed)
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     PT Comments    Pt progressing well with her mobility.  Assisted OOB to bathroom then amb an increased distance in the hallway.  Assisted back to bed pt pt request to rest. Pt plans to D/C to home.    Follow Up Recommendations  No PT follow up;Supervision - Intermittent     Equipment Recommendations  None recommended by PT    Recommendations for Other Services       Precautions / Restrictions Precautions Precautions: Fall Precaution Comments: NG tube (currently clamoed) Restrictions Weight Bearing Restrictions: No    Mobility  Bed Mobility Overal bed mobility: Needs Assistance Bed Mobility: Supine to Sit;Sit to Supine     Supine to sit: Supervision;Min guard Sit to supine: Supervision;Min guard   General bed mobility comments: increased time  Transfers Overall transfer level: Needs assistance Equipment used: Rolling walker (2 wheeled) Transfers: Sit to/from Stand Sit to Stand: Supervision         General transfer comment: cues for ue placement  Ambulation/Gait Ambulation/Gait assistance: Supervision Ambulation Distance (Feet): 75 Feet Assistive device: Rolling walker (2 wheeled) Gait Pattern/deviations: Step-through pattern;Decreased stride length;Trunk flexed Gait velocity: decreased   General Gait Details: increased time and VC's safety with turns using walker   Stairs            Wheelchair Mobility    Modified Rankin (Stroke Patients Only)       Balance                                    Cognition Arousal/Alertness: Awake/alert Behavior During Therapy: WFL for tasks assessed/performed Overall Cognitive Status: Within Functional Limits for tasks assessed                       Exercises      General Comments        Pertinent Vitals/Pain Pain Assessment: Faces Faces Pain Scale: Hurts a little bit Pain Location: AbD Pain Descriptors / Indicators: Discomfort Pain Intervention(s): Monitored during session;Repositioned    Home Living Family/patient expects to be discharged to:: Private residence Living Arrangements: Other relatives Available Help at Discharge: Family Type of Home: House       Home Equipment: None      Prior Function Level of Independence: Independent      Comments: sister is caregiver   PT Goals (current goals can now be found in the care plan section) Acute Rehab PT Goals Patient Stated Goal: home Progress towards PT goals: Progressing toward goals    Frequency  Min 3X/week    PT Plan Current plan remains appropriate    Co-evaluation             End of Session Equipment Utilized During Treatment: Gait belt Activity Tolerance: Patient tolerated treatment well;Other (comment) Patient left: in bed;with call bell/phone within reach     Time: 1055-1120 PT Time Calculation (min) (ACUTE ONLY): 25 min  Charges:  $Gait Training: 8-22 mins $Therapeutic Activity: 8-22 mins                    G Codes:      Rica Koyanagi  PTA [facility]  Acute  Rehab Pager  [phone]  

## 2015-06-11 NOTE — Care Management Important Message (Signed)
Important Message  Patient Details  Name: Brianna Walker MRN: [id number] Date of Birth: 1946/01/24   Medicare Important Message Given:  Yes    Camillo Flaming 06/11/2015, 11:55 AMImportant Message  Patient Details  Name: Brianna Walker MRN: [id number] Date of Birth: 01/07/1946   Medicare Important Message Given:  Yes    Camillo Flaming 06/11/2015, 11:55 AM

## 2015-06-11 NOTE — Progress Notes (Signed)
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 Resection of cystic mass near small bowel with serosal repair.  Surgeon(s): Michael Boston, MD Leighton Ruff, MD - Assist   Diagnosis 1. Soft tissue, biopsy, abdominal wall cyst ? fistula BENIGN CYST AND REACTIVE CHANGES NEGATIVE FOR MALIGNANCY 2. Soft tissue mass, simple excision, Pelvic GIST GASTROINTESTINAL STROMAL TUMORS OF THE RECTUM SHOWING TREATMENT EFFECT (9.5 CM) MARGIN OF RESECTION IS POSITIVE 3. Colon, segmental resection for tumor, rectum DIVERTICULA NO RESIDUAL GASTROINTESTINAL STROMAL TUMOR IDENTIFIED MARGINS OF RESECTION ARE NEGATIVE FIFTEEN BENIGN LYMPH NODES (0/15) Microscopic Comment 2. GASTROINTESTINAL STROMAL TUMOR (GIST): Procedure: segmental resection Tumor Site: rectum Specify: pelvis rectum Tumor Size: 9.5 cm Tumor Focality Unifocal: focal GIST Subtype (spindle, epithelioid, mixed, etc.): spindle Mitotic Rate: 1 /50 HIGH POWER FIELD Necrosis: negative Histologic Grade 1 G1: Low grade; mitotic rate 5/50 HIGH POWER FIELD. G2: High grade; mitotic rate >5/50 HIGH POWER FIELD. Risk Assessment: _ Margins: positive Ancillary testing: Per request 1 of 3 FINAL for TANGALA, WIEGERT [phone]) Microscopic Comment(continued) Lymph nodes: number  examined: 15; number positive: 0 Pathologic Staging: pT3 pN0 Comments: The tumor shows treatment effect with hypocellular, atrophic spindle cells and hyalinized, fibrotic stroma. less than 1% of the tumor without treatment effect. [name] MD Pathologist, Electronic Signature (Case signed 06/10/2015) Specimen Sanna Porcaro and Clinical Information Specimen(s) Obtained: 1. Soft tissue, biopsy, abdominal wall cyst ? fistula 2. Soft tissue mass, simple excision, Pelvic GIST 3. Colon, segmental resection for tumor, rectum Specimen Clinical Information 1. GIST tumor of rectum in pelvis (kp) Johnathin Vanderschaaf 1. In formalin is a a 3.1 x 1.4 x 0.3 cm irregular portion of fatty tissue, which on sectioning contains a 0.5 cm collapsed smooth cystic space. Sectioned and entirely submitted in one block. 2. Received in formalin is a 176 gram, 9.5 x 6.8 x 5.8 cm rubbery to firm mass, which has an attached suture over a 2.1 x 1.5 cm roughened area, clinically identifying anterior distal. This area is inked orange. There is also a 5.0 x 4.2 cm disrupted area, which is clinically identified as disrupted margin. This area is inked black. Also along one aspect is a 3.4 x 3.3 cm area of tan to hyperemic possible smooth mucosa. The cut surfaces of the mass are tan to tan yellow, with vaguely whorled cut surfaces, and scattered myxoid areas. Representative sections are submitted as follows: A, B = sections from clinically disrupted margin C = sections from area identified by suture as anterior distal D, E = central sections Total: 5 blocks submitted 3. Received in formalin is a 27 cm in length segment of colon, clinically recto-sigmoid, which includes at least probable middle third of rectum. Surrounding mesorectum is incomplete and focally disrupted. There is an attached suture over the anterior perirectal surface. The mucosa is tan pink, smooth, soft with normal intestinal folds. There are no mucosal lesions identified.  Within the  sigmoid portion are several unperforated diverticula. Found within the surrounding fat are 15 possible lymph nodes ranging from 0.1 to 0.6 cm. Block Summary: A = proximal margin B, C = distal margin D, E = sections taken at attached suture over anterior mesorectum F = diverticulum G = four nodes, whole H = four nodes, whole I = four nodes, whole J = three nodes, whole Total: 10 blocks submitted (SW:ds 06/07/15) 2 of 3 FINAL for EMMILIA, SOWDER ([id number]) Report signed out from the following location(s) Technical component and interpretation was performed at [email or web address], [place], [place] [address]. CLIA #: [id number], 3 of 3 Assessment  ILEUS resolving   Plan:  -NPO -NGT clamping trial with liquids -IVF  -set up [facility] IVF qMWF to avoid dehydration since orthostatic & fait PO intake to avoid ARF/readmission -check lytes -OSTOMY CARE / TRAINING -prob d/c drain at d/c home -VTE prophylaxis- SCDs, etc -mobilize as tolerated to help recovery  -f/u pathology - ?+margin most likely fracture of GIST exposed.  Most likely will get gleevec post-adjuvant for 3 years total per Dr Benay Spice  -CT scan if worse  D/C patient from hospital when patient meets criteria (anticipate in ?? day(s)):  Tolerating oral intake well Ambulating well Adequate pain control without IV medications Urinating  Having flatus Disposition planning in place - esp. HH arranged for ileostomy training/care & IVF boluses at home   Adin [name], M.D., F.A.C.S. Gastrointestinal and Minimally Invasive Surgery [facility] [address], [place], [place] [address] [phone] Main / Paging   06/11/2015  Subjective:  Walking well in hallways No anorectal pain More gas in ostomy bag No more nausea Feels better s/p NGT Less RUQ pain near ileostomy    Objective:  Vital signs:  Filed Vitals:   06/10/15 0504 06/10/15 1323  06/10/15 2116 06/11/15 0507  BP: 142/60 158/89 157/95 139/78  Pulse: 113 120 113 106  Temp: 98.8 F (37.1 C) 98.7 F (37.1 C) 99.1 F (37.3 C) 99 F (37.2 C)  TempSrc: Axillary Oral Oral Oral  Resp: _0 Height:      Weight: 104.3 kg (229 lb 15 oz)   101.152 kg (223 lb)  SpO2: 98% 98% 99% 95%    Last BM Date: 06/09/15  Intake/Output   Yesterday:  02/09 0701 - 02/10 0700 In: 30 [I.V.:10; NG/GT:20] Out: 3010 [Urine:2000; Emesis/NG output:475; Drains:330; Stool:205] This shift:  Total I/O In: 30 [I.V.:10; NG/GT:20] Out: 370 [Emesis/NG output:300; Drains:70]  Bowel function:  Flatus: scant  BM: not now (but thick effluent & gas in bag earlier)  Drain: serosanguinous  Physical Exam:  General: Pt awake/alert/oriented x4 in no acute distress.  Tired but not toxic Eyes: PERRL, normal EOM.  Sclera clear.  No icterus Neuro: CN II-XII intact w/o focal sensory/motor deficits. Lymph: No head/neck/groin lymphadenopathy Psych:  No delerium/psychosis/paranoia HENT: Normocephalic, Mucus membranes moist.  No thrush Neck: Supple, No tracheal deviation Chest: No chest wall pain w good excursion CV:  Pulses intact.  Regular rhythm MS: Normal AROM mjr joints.  No obvious deformity Abdomen: Soft but morbidly obese.  Mildly distended.  Nontender.  No fluctuance/cellultis.  No guarding.  No evidence of peritonitis.  No incarcerated hernias.  Mild duskiness & mod edema at ileostomy but stable.  No necrosis Ext:  SCDs BLE.  No mjr edema.  No cyanosis Skin: No petechiae / purpura  Results:   Labs: Results for orders placed or  performed during the hospital encounter of 06/04/15 (from the past 48 hour(s))  Basic metabolic panel     Status: Abnormal   Collection Time: 06/09/15  9:40 AM  Result Value Ref Range   Sodium 140 135 - 145 mmol/L   Potassium 4.0 3.5 - 5.1 mmol/L   Chloride 104 101 - 111 mmol/L   CO2 28 22 - 32 mmol/L   Glucose, Bld 115 (H) 65 - 99 mg/dL   BUN 8 6 - 20  mg/dL   Creatinine, Ser 0.92 0.44 - 1.00 mg/dL   Calcium 9.0 8.9 - 10.3 mg/dL   GFR calc non Af Amer >60 >60 mL/min   GFR calc Af Amer >60 >60 mL/min    Comment: (NOTE) The eGFR has been calculated using the CKD EPI equation. This calculation has not been validated in all clinical situations. eGFR's persistently <60 mL/min signify possible Chronic Kidney Disease.    Anion gap 8 5 - 15  CBC     Status: Abnormal   Collection Time: 06/09/15  9:40 AM  Result Value Ref Range   WBC 11.6 (H) 4.0 - 10.5 K/uL   RBC 2.98 (L) 3.87 - 5.11 MIL/uL   Hemoglobin 8.7 (L) 12.0 - 15.0 g/dL   HCT 26.5 (L) 36.0 - 46.0 %   MCV 88.9 78.0 - 100.0 fL   MCH 29.2 26.0 - 34.0 pg   MCHC 32.8 30.0 - 36.0 g/dL   RDW 15.1 11.5 - 15.5 %   Platelets 297 150 - 400 K/uL  Magnesium     Status: None   Collection Time: 06/10/15  8:30 AM  Result Value Ref Range   Magnesium 1.8 1.7 - 2.4 mg/dL  Basic metabolic panel     Status: Abnormal   Collection Time: 06/10/15  9:55 AM  Result Value Ref Range   Sodium 138 135 - 145 mmol/L   Potassium 3.9 3.5 - 5.1 mmol/L   Chloride 103 101 - 111 mmol/L   CO2 24 22 - 32 mmol/L   Glucose, Bld 114 (H) 65 - 99 mg/dL   BUN 10 6 - 20 mg/dL   Creatinine, Ser 0.83 0.44 - 1.00 mg/dL   Calcium 8.6 (L) 8.9 - 10.3 mg/dL   GFR calc non Af Amer >60 >60 mL/min   GFR calc Af Amer >60 >60 mL/min    Comment: (NOTE) The eGFR has been calculated using the CKD EPI equation. This calculation has not been validated in all clinical situations. eGFR's persistently <60 mL/min signify possible Chronic Kidney Disease.    Anion gap 11 5 - 15      Imaging / Studies: No results found.  Medications / Allergies: per chart  Antibiotics: Anti-infectives    Start     Dose/Rate Route Frequency Ordered Stop   06/05/15 0200  cefoTEtan (CEFOTAN) 2 g in dextrose 5 % 50 mL IVPB     2 g 100 mL/hr over 30 Minutes Intravenous Every 12 hours 06/04/15 1702 06/05/15 0222   06/04/15 1044  clindamycin  (CLEOCIN) 900 mg, gentamicin (GARAMYCIN) 240 mg in sodium chloride 0.9 % 1,000 mL for intraperitoneal lavage  Status:  Discontinued       As needed 06/04/15 1044 06/04/15 1545   06/04/15 0613  cefoTEtan (CEFOTAN) 2 g in dextrose 5 % 50 mL IVPB     2 g 100 mL/hr over 30 Minutes Intravenous On call to O.R. 06/04/15 9833 06/04/15 1345   06/03/15 1400  clindamycin (CLEOCIN) 900 mg, gentamicin (GARAMYCIN) 240  mg in sodium chloride 0.9 % 1,000 mL for intraperitoneal lavage  Status:  Discontinued    Comments:  Pharmacy may adjust dosing strength, schedule, rate of infusion, etc as needed to optimize therapy    Intraperitoneal To Surgery 06/03/15 1351 06/04/15 1703        Note: Portions of this report may have been transcribed using voice recognition software. Every effort was made to ensure accuracy; however, inadvertent computerized transcription errors may be present.   Any transcriptional errors that result from this process are unintentional.     Adin [name], M.D., F.A.C.S. Gastrointestinal and Minimally Invasive Surgery [facility] [address], [place], [place] [address] [phone] Main / Paging   06/11/2015  CARE TEAM:  PCP: Cathlean Cower, MD  Outpatient Care Team: Patient Care Team: Biagio Borg, MD as PCP - General Michael Boston, MD as Consulting Physician (General Surgery) Ladell Pier, MD as Consulting Physician (Oncology) Milus Banister, MD as Consulting Physician (Gastroenterology) Billy Fischer, MD as Consulting Physician (Family Medicine)  Inpatient Treatment Team: Treatment Team: Attending Provider: Michael Boston, MD; Technician: Marguarite Arbour, NT; Technician: Tenna Child, [place]; Registered Nurse: July D Aguilar, RN; Technician: Coralie Carpen, NT; Consulting Physician: Ladell Pier, MD; Occupational Therapist: Lesle Chris, OT; Registered Nurse: Guadalupe Dawn, RN

## 2015-06-11 NOTE — Progress Notes (Signed)
Initial Nutrition Assessment  DOCUMENTATION CODES:   Obesity unspecified  INTERVENTION:   - Provide Ensure Enlive po BID, each supplement provides 350 kcals and 20 grams of protein. - RD will continue to monitor for needs.  NUTRITION DIAGNOSIS:   Increased nutrient needs related to cancer and cancer related treatments, chronic illness as evidenced by estimated needs.  GOAL:   Patient will meet greater than or equal to 90% of their needs  MONITOR:   PO intake, Supplement acceptance, Diet advancement, Labs, Weight trends  REASON FOR ASSESSMENT:   LOS  ASSESSMENT:   70 year old female who presents with an abdominal mass. Patient follows up for Extraluminal pelvic gastrointestinal stromal tumor.  Pt reports to be feeling much better.  Currently pt has NGT to suction.  States that she is tolerating all liquids with no nausea or abdominal pain.  Patient reports prior attempts consuming Ensure resulted in a queasy feeling. Pt offered to try a Strawberry Ensure, pt consumed 100%.  PTA patient reports a good appetite where she eats "anything she wants" including pizza and baked foods.  Reports changes in taste and flavor preferences.    Nutrition Focused Physical Exam was conducted.  Findings include no fat depletion, no muscle depletion, and mild edema.  Pt reports usual body weight of 220#.  States that her weight was slightly elevated; however she is down 6# this week to 223#.   Meds reviewed: reglan PRN, lopressor PRN. Labs reviewed: glucose (114); Ca (8.6); EGFR (53).  Diet Order:  Diet clear liquid Room service appropriate?: Yes; Fluid consistency:: Thin  Skin:  Reviewed, no issues  Last BM:  2/9  Height:   Ht Readings from Last 1 Encounters:  06/04/15 '5\' 5"'  (1.651 m)   Weight:   Wt Readings from Last 1 Encounters:  06/11/15 223 lb (101.152 kg)   Ideal Body Weight:  56.8 kg  BMI:  Body mass index is 37.11 kg/(m^2).  Estimated Nutritional Needs:   Kcal:   2000-2200  Protein:  121-152 grams (1.2-1.5 g protein)  Fluid:  >/= 2L  EDUCATION NEEDS:   No education needs identified at this time  Veronda Prude, Dietetic Intern Pager: [phone]  RD has reviewed this note and agrees with interns findings and intervention.  Satira Anis. Kenita Bines, MS, RD LDN After Hours/Weekend Pager [phone]

## 2015-06-12 MED ORDER — LACTATED RINGERS IV BOLUS (SEPSIS): 1000.0000 mL | Freq: Three times a day (TID) | INTRAVENOUS | Status: AC | PRN

## 2015-06-12 MED ORDER — GLUCERNA SHAKE PO LIQD
237.0000 mL | Freq: Two times a day (BID) | ORAL | Status: DC
  Administered 2015-06-16 – 2015-06-18 (×3): 237 mL via ORAL
  Filled 2015-06-12 (×14): qty 237

## 2015-06-12 MED ORDER — LACTATED RINGERS IV BOLUS (SEPSIS)
2000.0000 mL | Freq: Once | INTRAVENOUS | Status: AC
  Administered 2015-06-12: 2000 mL via INTRAVENOUS

## 2015-06-12 MED ORDER — LACTATED RINGERS IV BOLUS (SEPSIS): 1000.0000 mL | Freq: Once | INTRAVENOUS | Status: DC

## 2015-06-12 NOTE — Progress Notes (Signed)
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 Resection of cystic mass near small bowel with serosal repair.  Surgeon(s): Michael Boston, MD Leighton Ruff, MD - Assist   Diagnosis 1. Soft tissue, biopsy, abdominal wall cyst ? fistula BENIGN CYST AND REACTIVE CHANGES NEGATIVE FOR MALIGNANCY 2. Soft tissue mass, simple excision, Pelvic GIST GASTROINTESTINAL STROMAL TUMORS OF THE RECTUM SHOWING TREATMENT EFFECT (9.5 CM) MARGIN OF RESECTION IS POSITIVE 3. Colon, segmental resection for tumor, rectum DIVERTICULA NO RESIDUAL GASTROINTESTINAL STROMAL TUMOR IDENTIFIED MARGINS OF RESECTION ARE NEGATIVE FIFTEEN BENIGN LYMPH NODES (0/15) Microscopic Comment 2. GASTROINTESTINAL STROMAL TUMOR (GIST): Procedure: segmental resection Tumor Site: rectum Specify: pelvis rectum Tumor Size: 9.5 cm Tumor Focality Unifocal: focal GIST Subtype (spindle, epithelioid, mixed, etc.): spindle Mitotic Rate: 1 /50 HIGH POWER FIELD Necrosis: negative Histologic Grade 1 G1: Low grade; mitotic rate 5/50 HIGH POWER FIELD. G2: High grade; mitotic rate >5/50 HIGH POWER FIELD. Risk Assessment: _ Margins: positive Ancillary testing: Per request 1 of 3 FINAL for ERNESTINA, JOE [phone]) Microscopic Comment(continued) Lymph nodes: number  examined: 15; number positive: 0 Pathologic Staging: pT3 pN0 Comments: The tumor shows treatment effect with hypocellular, atrophic spindle cells and hyalinized, fibrotic stroma. less than 1% of the tumor without treatment effect. [name] MD Pathologist, Electronic Signature (Case signed 06/10/2015) Specimen Oshae Simmering and Clinical Information Specimen(s) Obtained: 1. Soft tissue, biopsy, abdominal wall cyst ? fistula 2. Soft tissue mass, simple excision, Pelvic GIST 3. Colon, segmental resection for tumor, rectum Specimen Clinical Information 1. GIST tumor of rectum in pelvis (kp) Nikkolas Coomes 1. In formalin is a a 3.1 x 1.4 x 0.3 cm irregular portion of fatty tissue, which on sectioning contains a 0.5 cm collapsed smooth cystic space. Sectioned and entirely submitted in one block. 2. Received in formalin is a 176 gram, 9.5 x 6.8 x 5.8 cm rubbery to firm mass, which has an attached suture over a 2.1 x 1.5 cm roughened area, clinically identifying anterior distal. This area is inked orange. There is also a 5.0 x 4.2 cm disrupted area, which is clinically identified as disrupted margin. This area is inked black. Also along one aspect is a 3.4 x 3.3 cm area of tan to hyperemic possible smooth mucosa. The cut surfaces of the mass are tan to tan yellow, with vaguely whorled cut surfaces, and scattered myxoid areas. Representative sections are submitted as follows: A, B = sections from clinically disrupted margin C = sections from area identified by suture as anterior distal D, E = central sections Total: 5 blocks submitted 3. Received in formalin is a 27 cm in length segment of colon, clinically recto-sigmoid, which includes at least probable middle third of rectum. Surrounding mesorectum is incomplete and focally disrupted. There is an attached suture over the anterior perirectal surface. The mucosa is tan pink, smooth, soft with normal intestinal folds. There are no mucosal lesions identified.  Within the  sigmoid portion are several unperforated diverticula. Found within the surrounding fat are 15 possible lymph nodes ranging from 0.1 to 0.6 cm. Block Summary: A = proximal margin B, C = distal margin D, E = sections taken at attached suture over anterior mesorectum F = diverticulum G = four nodes, whole H = four nodes, whole I = four nodes, whole J = three nodes, whole Total: 10 blocks submitted (SW:ds 06/07/15) 2 of 3 FINAL for JERAE, IZARD ([id number]) Report signed out from the following location(s) Technical component and interpretation was performed at [facility], [place], [place] [address]. CLIA #: [id number], 3 of 3 Assessment  ILEUS resolving   Plan:  -STRICT I&O - challenge to get sense of what is going on w patient claiming better UOP & ileostomy output than recordered.  D/w [place] Ar RN whom will stay on top of it & try to re-encourage patient to help with maintaining strict I&O  -retry NGT clamping trial with liquids  -IVF bolus  -set up [facility] IVF qMWF to avoid dehydration since orthostatic & fait PO intake to avoid ARF/readmission  -check lytes  -OSTOMY CARE / TRAINING  -prob d/c drain at d/c home  -VTE prophylaxis- SCDs, etc  -mobilize as tolerated to help recovery  -Pathology - ?+margin most likely fracture of GIST exposed.  Most likely will get gleevec post-adjuvant for 3 years total per Dr Benay Spice.  D/w him again today  -CT scan if worse  D/C patient from hospital when patient meets criteria (anticipate in ?? day(s)):  Tolerating oral intake well Ambulating well Adequate pain control without IV medications Urinating  Having flatus Ostomy training Disposition planning in place - esp. HH arranged for ileostomy training/care & IVF boluses at home   Adin [name], M.D., F.A.C.S. Gastrointestinal and Minimally Invasive Surgery [facility] [address] [place], [place]  [address] [phone] Main / Paging   06/12/2015  Subjective:  "I feel better" Walking well in hallways No anorectal pain Really having gas in ostomy bag Nausea w NGT clamp yesterday - not today Minimal RUQ pain near ileostomy    Objective:  Vital signs:  Filed Vitals:   06/11/15 0507 06/11/15 1330 06/11/15 2125 06/12/15 0555  BP: 139/78 145/83 147/79 141/73  Pulse: 106 110 114 107  Temp: 99 F (37.2 C) 98.9 F (37.2 C) 100.2 F (37.9 C) 99.6 F (37.6 C)  TempSrc: Oral Oral Oral Oral  Resp: '16 16 16 16  ' Height:      Weight: 101.152 kg (223 lb)     SpO2: 95% 98% 96% 98%    Last BM Date: 06/10/15  Intake/Output   Yesterday:  02/10 0701 - 02/11 0700 In: 3151.7 [I.V.:2388.3; NG/GT:120] Out: 2425 [Urine:1275; Emesis/NG output:1050; Drains:100] This shift:  Total I/O In: 120 [Other:120] Out: 150 [Urine:150]  Bowel function:  Flatus: YES  BM: thin mild vol effluent in bag)  Drain: serosanguinous  Physical Exam:  General: Pt awake/alert/oriented x4 in no acute distress.  Tired but not toxic Eyes: PERRL, normal EOM.  Sclera clear.  No icterus Neuro: CN II-XII intact w/o focal sensory/motor deficits. Lymph: No head/neck/groin lymphadenopathy Psych:  No delerium/psychosis/paranoia HENT: Normocephalic, Mucus membranes moist.  No thrush Neck: Supple, No tracheal deviation Chest: No chest wall pain w good excursion CV:  Pulses intact.  Regular rhythm MS: Normal AROM mjr joints.  No obvious deformity Abdomen: Soft but morbidly obese.  Mildly distended.  Nontender.  No fluctuance/cellultis.  No guarding.  No evidence of peritonitis.  No incarcerated hernias.  Less duskiness / min edema at ileostomy - improved.  No necrosis Ext:  SCDs BLE.  No mjr edema.  No cyanosis Skin: No petechiae / purpura  Results:   Labs: Results for orders placed or performed during the hospital encounter of 06/04/15 (from the past 48 hour(s))  Basic metabolic panel     Status:  Abnormal   Collection Time: 06/10/15  9:55 AM  Result Value Ref Range   Sodium 138 135 - 145 mmol/L   Potassium 3.9 3.5 - 5.1 mmol/L   Chloride 103 101 - 111 mmol/L   CO2 24 22 - 32 mmol/L   Glucose, Bld 114 (H) 65 - 99 mg/dL   BUN 10 6 - 20 mg/dL   Creatinine, Ser 0.83 0.44 - 1.00 mg/dL   Calcium 8.6 (L) 8.9 - 10.3 mg/dL   GFR calc non Af Amer >60 >60 mL/min   GFR calc Af Amer >60 >60 mL/min    Comment: (NOTE) The eGFR has been calculated using the CKD EPI equation. This calculation has not been validated in all clinical situations. eGFR's persistently <60 mL/min signify possible Chronic Kidney Disease.    Anion gap 11 5 - 15  Creatinine, serum     Status: Abnormal   Collection Time: 06/11/15  6:00 AM  Result Value Ref Range   Creatinine, Ser 1.20 (H) 0.44 - 1.00 mg/dL   GFR calc non Af Amer 46 (L) >60 mL/min   GFR calc Af Amer 53 (L) >60 mL/min    Comment: (NOTE) The eGFR has been calculated using the CKD EPI equation. This calculation has not been validated in all clinical situations. eGFR's persistently <60 mL/min signify possible Chronic Kidney Disease.       Imaging / Studies: No results found.  Medications / Allergies: per chart  Antibiotics: Anti-infectives    Start     Dose/Rate Route Frequency Ordered Stop   06/05/15 0200  cefoTEtan (CEFOTAN) 2 g in dextrose 5 % 50 mL IVPB     2 g 100 mL/hr over 30 Minutes Intravenous Every 12 hours 06/04/15 1702 06/05/15 0222   06/04/15 1044  clindamycin (CLEOCIN) 900 mg, gentamicin (GARAMYCIN) 240 mg in sodium chloride 0.9 % 1,000 mL for intraperitoneal lavage  Status:  Discontinued       As needed 06/04/15 1044 06/04/15 1545   06/04/15 0613  cefoTEtan (CEFOTAN) 2 g in dextrose 5 % 50 mL IVPB     2 g 100 mL/hr over 30 Minutes Intravenous On call to O.R. 06/04/15 7782 06/04/15 1345   06/03/15 1400  clindamycin (CLEOCIN) 900 mg, gentamicin (GARAMYCIN) 240 mg in sodium chloride 0.9 % 1,000 mL for intraperitoneal lavage   Status:  Discontinued    Comments:  Pharmacy may adjust dosing strength, schedule, rate of infusion, etc as needed to optimize therapy    Intraperitoneal To Surgery 06/03/15 1351 06/04/15 1703        Note: Portions of this report may have been transcribed using voice recognition software. Every effort was made to ensure accuracy; however, inadvertent computerized transcription errors may be present.   Any transcriptional errors that result from this process are unintentional.     Adin [name], M.D., F.A.C.S. Gastrointestinal and Minimally Invasive Surgery [facility] [address] [place], [place] [address] [phone] Main / Paging   06/12/2015  CARE TEAM:  PCP: Cathlean Cower, MD  Outpatient Care Team: Patient  Care Team: Biagio Borg, MD as PCP - General Michael Boston, MD as Consulting Physician (General Surgery) Ladell Pier, MD as Consulting Physician (Oncology) Milus Banister, MD as Consulting Physician (Gastroenterology) Billy Fischer, MD as Consulting Physician (Family Medicine)  Inpatient Treatment Team: Treatment Team: Attending Provider: Michael Boston, MD; Technician: Tenna Child, [place]; Registered Nurse: July D Aguilar, RN; Technician: Coralie Carpen, NT; Consulting Physician: Ladell Pier, MD; Registered Nurse: Guadalupe Dawn, RN; Registered Nurse: Celedonio Savage, RN

## 2015-06-12 NOTE — Consult Note (Signed)
WOC attempted teaching visit with patient, she is not feeling well.  Family at the bedside and requested that [facility] nurse try teaching visit tomorrow.  Fay Swider [place] RN,CWOCN [id number]

## 2015-06-13 LAB — CBC
HEMATOCRIT: 24 % — AB (ref 36.0–46.0)
Hemoglobin: 7.7 g/dL — ABNORMAL LOW (ref 12.0–15.0)
MCH: 28.5 pg (ref 26.0–34.0)
MCHC: 32.1 g/dL (ref 30.0–36.0)
MCV: 88.9 fL (ref 78.0–100.0)
Platelets: 321 10*3/uL (ref 150–400)
RBC: 2.7 MIL/uL — AB (ref 3.87–5.11)
RDW: 15.4 % (ref 11.5–15.5)
WBC: 10.5 10*3/uL (ref 4.0–10.5)

## 2015-06-13 LAB — BASIC METABOLIC PANEL
ANION GAP: 11 (ref 5–15)
BUN: 11 mg/dL (ref 6–20)
CO2: 22 mmol/L (ref 22–32)
Calcium: 8.4 mg/dL — ABNORMAL LOW (ref 8.9–10.3)
Chloride: 106 mmol/L (ref 101–111)
Creatinine, Ser: 0.92 mg/dL (ref 0.44–1.00)
Glucose, Bld: 73 mg/dL (ref 65–99)
POTASSIUM: 4.3 mmol/L (ref 3.5–5.1)
SODIUM: 139 mmol/L (ref 135–145)

## 2015-06-13 MED ORDER — IMATINIB MESYLATE 100 MG PO TABS
400.0000 mg | ORAL_TABLET | Freq: Every day | ORAL | Status: DC
  Filled 2015-06-13: qty 4

## 2015-06-13 MED ORDER — FERROUS SULFATE 325 (65 FE) MG PO TABS
325.0000 mg | ORAL_TABLET | Freq: Every day | ORAL | Status: DC
  Administered 2015-06-13: 325 mg via ORAL
  Filled 2015-06-13 (×2): qty 1

## 2015-06-13 MED ORDER — ASPIRIN EC 325 MG PO TBEC
325.0000 mg | DELAYED_RELEASE_TABLET | Freq: Every day | ORAL | Status: DC
  Administered 2015-06-13 – 2015-06-18 (×6): 325 mg via ORAL
  Filled 2015-06-13 (×6): qty 1

## 2015-06-13 MED ORDER — LACTATED RINGERS IV BOLUS (SEPSIS)
2000.0000 mL | Freq: Once | INTRAVENOUS | Status: AC
  Administered 2015-06-13: 2000 mL via INTRAVENOUS

## 2015-06-13 NOTE — Progress Notes (Signed)
Pt emptied ostomy bag while sitting on the side of the bed herself with minimal assist. Pt opened and closed ostomy, and cleansed pouch independently.  Rosie Fate

## 2015-06-13 NOTE — Progress Notes (Addendum)
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 Resection of cystic mass near small bowel with serosal repair.  Surgeon(s): Michael Boston, MD Leighton Ruff, MD - Assist   Diagnosis 1. Soft tissue, biopsy, abdominal wall cyst ? fistula BENIGN CYST AND REACTIVE CHANGES NEGATIVE FOR MALIGNANCY 2. Soft tissue mass, simple excision, Pelvic GIST GASTROINTESTINAL STROMAL TUMORS OF THE RECTUM SHOWING TREATMENT EFFECT (9.5 CM) MARGIN OF RESECTION IS POSITIVE 3. Colon, segmental resection for tumor, rectum DIVERTICULA NO RESIDUAL GASTROINTESTINAL STROMAL TUMOR IDENTIFIED MARGINS OF RESECTION ARE NEGATIVE FIFTEEN BENIGN LYMPH NODES (0/15) Microscopic Comment 2. GASTROINTESTINAL STROMAL TUMOR (GIST): Procedure: segmental resection Tumor Site: rectum Specify: pelvis rectum Tumor Size: 9.5 cm Tumor Focality Unifocal: focal GIST Subtype (spindle, epithelioid, mixed, etc.): spindle Mitotic Rate: 1 /50 HIGH POWER FIELD Necrosis: negative Histologic Grade 1 G1: Low grade; mitotic rate 5/50 HIGH POWER FIELD. G2: High grade; mitotic rate >5/50 HIGH POWER FIELD. Risk Assessment: _ Margins: positive Ancillary testing: Per request 1 of 3 FINAL for BANNIE, LOBBAN ([phone]) Microscopic Comment(continued) Lymph nodes: number  examined: 15; number positive: 0 Pathologic Staging: pT3 pN0 Comments: The tumor shows treatment effect with hypocellular, atrophic spindle cells and hyalinized, fibrotic stroma. less than 1% of the tumor without treatment effect. [name] MD Pathologist, Electronic Signature (Case signed 06/10/2015) Specimen Garrie Woodin and Clinical Information Specimen(s) Obtained: 1. Soft tissue, biopsy, abdominal wall cyst ? fistula 2. Soft tissue mass, simple excision, Pelvic GIST 3. Colon, segmental resection for tumor, rectum Specimen Clinical Information 1. GIST tumor of rectum in pelvis (kp) Cathi Hazan 1. In formalin is a a 3.1 x 1.4 x 0.3 cm irregular portion of fatty tissue, which on sectioning contains a 0.5 cm collapsed smooth cystic space. Sectioned and entirely submitted in one block. 2. Received in formalin is a 176 gram, 9.5 x 6.8 x 5.8 cm rubbery to firm mass, which has an attached suture over a 2.1 x 1.5 cm roughened area, clinically identifying anterior distal. This area is inked orange. There is also a 5.0 x 4.2 cm disrupted area, which is clinically identified as disrupted margin. This area is inked black. Also along one aspect is a 3.4 x 3.3 cm area of tan to hyperemic possible smooth mucosa. The cut surfaces of the mass are tan to tan yellow, with vaguely whorled cut surfaces, and scattered myxoid areas. Representative sections are submitted as follows: A, B = sections from clinically disrupted margin C = sections from area identified by suture as anterior distal D, E = central sections Total: 5 blocks submitted 3. Received in formalin is a 27 cm in length segment of colon, clinically recto-sigmoid, which includes at least probable middle third of rectum. Surrounding mesorectum is incomplete and focally disrupted. There is an attached suture over the anterior perirectal surface. The mucosa is tan pink, smooth, soft with normal intestinal folds. There are no mucosal lesions identified.  Within the  sigmoid portion are several unperforated diverticula. Found within the surrounding fat are 15 possible lymph nodes ranging from 0.1 to 0.6 cm. Block Summary: A = proximal margin B, C = distal margin D, E = sections taken at attached suture over anterior mesorectum F = diverticulum G = four nodes, whole H = four nodes, whole I = four nodes, whole J = three nodes, whole Total: 10 blocks submitted (SW:ds 06/07/15) 2 of 3 FINAL for SEHER, SCHLAGEL ([id number]) Report signed out from the following location(s) Technical component and interpretation was performed at [facility], [place], [place] [address]. CLIA #: [id number], 3 of 3 Assessment  ILEUS resolving   Plan:  -STRICT I&O - challenge to get sense of what is going on w patient claiming better UOP & ileostomy output than recordered.  D/w [place] Ar RN whom will stay on top of it & try to re-encourage patient to help with maintaining strict I&O  -retry PO since tolerating clamp trial  -wean IVF  -set up [facility] IVF qMWF to avoid dehydration since orthostatic & fait PO intake to avoid ARF/readmission  -lytes OK - follow  -OSTOMY CARE / TRAINING  -d/c drain   -VTE prophylaxis- SCDs, etc  -GERD - PPI  -hyperlipidemia - Tx  -HTN - hold ACE Inh with chr diarrhea (diuretic effect) w ileostomy  -h/o PE - inc ASA.  Restart Eliquis soon if tol solids & ileus resolved  -mobilize as tolerated to help recovery  -Pathology - ?+margin most likely fracture of GIST exposed.  Most likely will get gleevec post-adjuvant for 3 years total per Dr Benay Spice.  D/w him again today  -CT scan if worse  D/C patient from hospital when patient meets criteria (anticipate in 2-3 day(s)):  Tolerating oral intake well Ambulating well Adequate pain control without IV medications Urinating  Having flatus Ostomy training Disposition planning in place - esp. HH arranged for ileostomy training/care & IVF boluses  at home   Adin [name], M.D., F.A.C.S. Gastrointestinal and Minimally Invasive Surgery [facility] [address] [place], [place] [address] [phone] Main / Paging   06/13/2015  Subjective:  "I feel better" Walking well in hallways No anorectal pain Really having gas in ostomy bag & effluent too Sore with NGT.  No residuals with clamp yesterday Minimal RUQ pain at ileostomy    Objective:  Vital signs:  Filed Vitals:   06/12/15 2130 06/13/15 0500 06/13/15 0549 06/13/15 0600  BP: 146/80   158/78  Pulse:   108   Temp:   99 F (37.2 C)   TempSrc:   Oral   Resp:   16   Height:      Weight:  102.1 kg (225 lb 1.4 oz)    SpO2:   99%     Last BM Date: 06/13/15  Intake/Output   Yesterday:  02/11 0701 - 02/12 0700 In: 4548.3 [I.V.:2188.3; IV Piggyback:2000] Out: 7943 [Urine:4300; Emesis/NG output:100; Drains:45; Stool:50] This shift:     Bowel function:  Flatus: YES  BM: thicker vol effluent in bag  Drain: serosanguinous  Physical Exam:  General: Pt awake/alert/oriented x4 in no acute distress.  Tired but not toxic Eyes: PERRL, normal EOM.  Sclera clear.  No icterus Neuro: CN II-XII intact w/o focal sensory/motor deficits. Lymph: No head/neck/groin lymphadenopathy Psych:  No delerium/psychosis/paranoia HENT: Normocephalic, Mucus membranes moist.  No thrush Neck: Supple, No tracheal deviation Chest: No chest wall pain w good excursion CV:  Pulses intact.  Regular rhythm MS: Normal AROM mjr joints.  No obvious deformity Abdomen: Soft but morbidly obese.  Nondistended.  Nontender.  No fluctuance/cellultis.  No guarding.  No evidence of peritonitis.  No incarcerated hernias.  Duskiness / min edema at ileostomy - stable.  No necrosis Ext:  SCDs BLE.  No mjr edema.  No cyanosis Skin: No petechiae / purpura  Results:   Labs: Results for orders placed or performed during the hospital encounter of 06/04/15 (from the past  48 hour(s))  Basic metabolic panel     Status: Abnormal   Collection Time: 06/13/15  5:45 AM  Result Value Ref Range   Sodium 139 135 - 145 mmol/L   Potassium 4.3 3.5 - 5.1 mmol/L   Chloride 106 101 - 111 mmol/L   CO2 22 22 - 32 mmol/L   Glucose, Bld 73 65 - 99 mg/dL   BUN 11 6 - 20 mg/dL   Creatinine, Ser 0.92 0.44 - 1.00 mg/dL   Calcium 8.4 (L) 8.9 - 10.3 mg/dL   GFR calc non Af Amer >60 >60 mL/min   GFR calc Af Amer >60 >60 mL/min    Comment: (NOTE) The eGFR has been calculated using the CKD EPI equation. This calculation has not been validated in all clinical situations. eGFR's persistently <60 mL/min signify possible Chronic Kidney Disease.    Anion gap 11 5 - 15  CBC     Status: Abnormal   Collection Time: 06/13/15  5:45 AM  Result Value Ref Range   WBC 10.5 4.0 - 10.5 K/uL   RBC 2.70 (L) 3.87 - 5.11 MIL/uL   Hemoglobin 7.7 (L) 12.0 - 15.0 g/dL   HCT 24.0 (L) 36.0 - 46.0 %   MCV 88.9 78.0 - 100.0 fL   MCH 28.5 26.0 - 34.0 pg   MCHC 32.1 30.0 - 36.0 g/dL   RDW 15.4 11.5 - 15.5 %   Platelets 321 150 - 400 K/uL      Imaging / Studies: No results found.  Medications / Allergies: per chart  Antibiotics: Anti-infectives    Start     Dose/Rate Route Frequency Ordered Stop   06/05/15 0200  cefoTEtan (CEFOTAN) 2 g in dextrose 5 % 50 mL IVPB     2 g 100 mL/hr over 30 Minutes Intravenous Every 12 hours 06/04/15 1702 06/05/15 0222   06/04/15 1044  clindamycin (CLEOCIN) 900 mg, gentamicin (GARAMYCIN) 240 mg in sodium chloride 0.9 % 1,000 mL for intraperitoneal lavage  Status:  Discontinued       As needed 06/04/15 1044 06/04/15 1545   06/04/15 0613  cefoTEtan (CEFOTAN) 2 g in dextrose 5 % 50 mL IVPB     2 g 100 mL/hr over 30 Minutes Intravenous On call to O.R. 06/04/15 7253 06/04/15 1345   06/03/15 1400  clindamycin (CLEOCIN) 900 mg, gentamicin (GARAMYCIN) 240 mg in sodium chloride 0.9 % 1,000 mL for intraperitoneal lavage  Status:  Discontinued    Comments:  Pharmacy  may adjust dosing strength, schedule, rate of infusion, etc as needed to optimize therapy    Intraperitoneal To Surgery 06/03/15 1351 06/04/15 1703        Note: Portions of this report may have been transcribed using voice recognition software. Every effort was made to ensure accuracy; however, inadvertent computerized transcription errors may be present.   Any transcriptional errors that result from this process are unintentional.     Adin [name], M.D., F.A.C.S. Gastrointestinal and Minimally Invasive Surgery [facility] [address] [place], [place] [address] [phone] Main / Paging   06/13/2015  CARE TEAM:  PCP: Cathlean Cower, MD  Outpatient Care Team: Patient Care Team: Biagio Borg, MD as PCP - General Michael Boston, MD as Consulting Physician (General Surgery) Ladell Pier, MD as Consulting Physician (Oncology) Milus Banister, MD as Consulting Physician (Gastroenterology) Billy Fischer, MD as Consulting Physician (Family Medicine)  Inpatient Treatment Team: Treatment Team: Attending Provider: Michael Boston, MD; Technician: Tenna Child, [place]; Registered Nurse: July D Aguilar, RN; Technician: Coralie Carpen, NT; Consulting Physician: Ladell Pier, MD; Registered Nurse: Guadalupe Dawn, RN; Registered Nurse: Celedonio Savage, RN

## 2015-06-13 NOTE — Consult Note (Signed)
WOC ostomy follow up Stoma type/location: RLQ loop ileostomy Stomal assessment/size: 1 and 3/8 inch slightly oval with functional os at 6 o'clock; 50% dusty, 50% red and moist. Functioning.  Peristomal assessment: Intact, clear. Suture line at 3 and 9 o'clock creates a small depression. Treatment options for stomal/peristomal skin: Skin barrier ring Output: light brown liquid effluent Ostomy pouching: 2pc. 2 and 1/4 inch pouching system with skin barrier ring Education provided: Patient is assisting with pouch system change today; recalls steps, but is shaky with system removal and hand-over-hand assistance is required.  She states that she is still drowsy from medication administered prior to drain removal this am, but is alert and conversive otherwise.  Patient can partially snap pouch onto skin barrier and is independent in performing closure of the Lock and Roll closure device. Today patient is able to demonstrate in a simulated manner pouch cleansing with a toilet paper wick.  She reports that another staff member put water inside of the pouch to cleanse it and she is taught that she will not-emptying into the toilet and cleaning only the bottom two inches of the pouch with ehr toile paper wicks.  Nursing Tech comes into the room and is instructed to assist patient in emptying her pouch today and tomorrow so that she is independent in this skill prior to discharge.  Patient is instructed on tips for avoiding dehydration post discharge; she will drink a liquid immediately following each pouch emptying in an attempt to manage fluid intake and output. Bedside RN is given the same information. Both are asked to document when patient is observed emptying pouch and cleaning tail closure independently. Enrolled patient in [place] Start Discharge program: Yes [facility] nursing team will follow, and will remain available to this patient, the nursing, surgical and medical teams.   Thanks, Maudie Flakes,  MSN, RN, [place], Arther Abbott  Pager# [phone]

## 2015-06-14 LAB — CBC
HCT: 25.4 % — ABNORMAL LOW (ref 36.0–46.0)
Hemoglobin: 8.3 g/dL — ABNORMAL LOW (ref 12.0–15.0)
MCH: 28.8 pg (ref 26.0–34.0)
MCHC: 32.7 g/dL (ref 30.0–36.0)
MCV: 88.2 fL (ref 78.0–100.0)
Platelets: 371 10*3/uL (ref 150–400)
RBC: 2.88 MIL/uL — ABNORMAL LOW (ref 3.87–5.11)
RDW: 15.2 % (ref 11.5–15.5)
WBC: 10.6 10*3/uL — ABNORMAL HIGH (ref 4.0–10.5)

## 2015-06-14 LAB — BASIC METABOLIC PANEL
Anion gap: 11 (ref 5–15)
BUN: 8 mg/dL (ref 6–20)
CALCIUM: 8.8 mg/dL — AB (ref 8.9–10.3)
CO2: 24 mmol/L (ref 22–32)
Chloride: 104 mmol/L (ref 101–111)
Creatinine, Ser: 0.86 mg/dL (ref 0.44–1.00)
GFR calc Af Amer: >60 mL/min (ref >60)
GLUCOSE: 88 mg/dL (ref 65–99)
Potassium: 3.8 mmol/L (ref 3.5–5.1)
Sodium: 139 mmol/L (ref 135–145)

## 2015-06-14 MED ORDER — BENAZEPRIL HCL 20 MG PO TABS
20.0000 mg | ORAL_TABLET | Freq: Every evening | ORAL | Status: DC
  Administered 2015-06-14 – 2015-06-16 (×3): 20 mg via ORAL
  Filled 2015-06-14 (×4): qty 1

## 2015-06-14 MED ORDER — SODIUM CHLORIDE 0.9% FLUSH
3.0000 mL | Freq: Two times a day (BID) | INTRAVENOUS | Status: DC
  Administered 2015-06-15 – 2015-06-17 (×2): 3 mL via INTRAVENOUS

## 2015-06-14 MED ORDER — SODIUM CHLORIDE 0.9% FLUSH: 3.0000 mL | INTRAVENOUS | Status: DC | PRN

## 2015-06-14 MED ORDER — OXYCODONE HCL 5 MG PO TABS
5.0000 mg | ORAL_TABLET | ORAL | Status: DC | PRN
  Administered 2015-06-16 – 2015-06-17 (×3): 10 mg via ORAL
  Filled 2015-06-14 (×3): qty 2

## 2015-06-14 MED ORDER — SODIUM CHLORIDE 0.9 % IV SOLN: 250.0000 mL | INTRAVENOUS | Status: DC | PRN

## 2015-06-14 MED ORDER — FERROUS SULFATE 325 (65 FE) MG PO TABS
325.0000 mg | ORAL_TABLET | Freq: Two times a day (BID) | ORAL | Status: DC
  Administered 2015-06-14 – 2015-06-15 (×3): 325 mg via ORAL
  Filled 2015-06-14 (×5): qty 1

## 2015-06-14 NOTE — Progress Notes (Signed)
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     PT Comments    Pt progressing well; still feels unsteady without RW; incr gait distance, HR 128 after amb; will likely  need RW for home  Follow Up Recommendations  No PT follow up;Supervision - Intermittent     Equipment Recommendations  Rolling walker with 5" wheels    Recommendations for Other Services       Precautions / Restrictions Precautions Precautions: Fall Restrictions Weight Bearing Restrictions: No    Mobility  Bed Mobility Overal bed mobility: Modified Independent Bed Mobility: Supine to Sit     Supine to sit: Modified independent (Device/Increase time);HOB elevated     General bed mobility comments: increased time  Transfers Overall transfer level: Needs assistance Equipment used: Rolling walker (2 wheeled) Transfers: Sit to/from [facility] Sit to Stand: Supervision Stand pivot transfers: Supervision       General transfer comment: cues for UE placement  Ambulation/Gait Ambulation/Gait assistance: Supervision Ambulation Distance (Feet): 380 Feet Assistive device: Rolling walker (2 wheeled) Gait Pattern/deviations: Step-through pattern;Decreased stride length;Trunk flexed     General Gait Details: occasional cues to maneuver RW, negotiate obstacles and stay within frame of walker   Stairs            Wheelchair Mobility    Modified Rankin (Stroke Patients Only)       Balance                                    Cognition Arousal/Alertness: Awake/alert Behavior During Therapy: WFL for tasks assessed/performed Overall Cognitive Status: Within Functional Limits for tasks assessed                      Exercises      General Comments         Pertinent Vitals/Pain Pain Assessment: No/denies pain    Home Living                      Prior Function            PT Goals (current goals can now be found in the care plan section) Acute Rehab PT Goals Patient Stated Goal: home PT Goal Formulation: With patient/family Time For Goal Achievement: 06/23/15 Potential to Achieve Goals: Good Progress towards PT goals: Progressing toward goals    Frequency  Min 3X/week    PT Plan Current plan remains appropriate    Co-evaluation             End of Session   Activity Tolerance: Patient tolerated treatment well Patient left: in chair;with call bell/phone within reach;with nursing/sitter in room     Time: [id number] PT Time Calculation (min) (ACUTE ONLY): 18 min  Charges:  $Gait Training: 8-22 mins                    G Codes:      Merari Pion 07/11/15, 12:12 PM

## 2015-06-14 NOTE — Progress Notes (Signed)
[facility] 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 Resection of cystic mass near small bowel with serosal repair.  Surgeon(s): Michael Boston, MD Leighton Ruff, MD - Assist   Diagnosis 1. Soft tissue, biopsy, abdominal wall cyst ? fistula BENIGN CYST AND REACTIVE CHANGES NEGATIVE FOR MALIGNANCY 2. Soft tissue mass, simple excision, Pelvic GIST GASTROINTESTINAL STROMAL TUMORS OF THE RECTUM SHOWING TREATMENT EFFECT (9.5 CM) MARGIN OF RESECTION IS POSITIVE 3. Colon, segmental resection for tumor, rectum DIVERTICULA NO RESIDUAL GASTROINTESTINAL STROMAL TUMOR IDENTIFIED MARGINS OF RESECTION ARE NEGATIVE FIFTEEN BENIGN LYMPH NODES (0/15) Microscopic Comment 2. GASTROINTESTINAL STROMAL TUMOR (GIST): Procedure: segmental resection Tumor Site: rectum Specify: pelvis rectum Tumor Size: 9.5 cm Tumor Focality Unifocal: focal GIST Subtype (spindle, epithelioid, mixed, etc.): spindle Mitotic Rate: 1 /50 HIGH POWER FIELD Necrosis: negative Histologic Grade 1 G1: Low grade; mitotic rate 5/50 HIGH POWER FIELD. G2: High grade; mitotic rate >5/50 HIGH POWER FIELD. Risk Assessment: _ Margins: positive Ancillary testing: Per request 1 of  3 FINAL for MARIEELENA, BARTKO [phone]) Microscopic Comment(continued) Lymph nodes: number examined: 15; number positive: 0 Pathologic Staging: pT3 pN0 Comments: The tumor shows treatment effect with hypocellular, atrophic spindle cells and hyalinized, fibrotic stroma. less than 1% of the tumor without treatment effect. [name] MD Pathologist, Electronic Signature (Case signed 06/10/2015) Specimen Adele Milson and Clinical Information Specimen(s) Obtained: 1. Soft tissue, biopsy, abdominal wall cyst ? fistula 2. Soft tissue mass, simple excision, Pelvic GIST 3. Colon, segmental resection for tumor, rectum Specimen Clinical Information 1. GIST tumor of rectum in pelvis (kp) Nickalous Stingley 1. In formalin is a a 3.1 x 1.4 x 0.3 cm irregular portion of fatty tissue, which on sectioning contains a 0.5 cm collapsed smooth cystic space. Sectioned and entirely submitted in one block. 2. Received in formalin is a 176 gram, 9.5 x 6.8 x 5.8 cm rubbery to firm mass, which has an attached suture over a 2.1 x 1.5 cm roughened area, clinically identifying anterior distal. This area is inked orange. There is also a 5.0 x 4.2 cm disrupted area, which is clinically identified as disrupted margin. This area is inked black. Also along one aspect is a 3.4 x 3.3 cm area of tan to hyperemic possible smooth mucosa. The cut surfaces of the mass are tan to tan yellow, with vaguely whorled cut surfaces, and scattered myxoid areas. Representative sections are submitted as follows: A, B = sections from clinically disrupted margin C = sections from area identified by suture as anterior distal D, E = central sections Total: 5 blocks submitted 3. Received in formalin is a 27 cm in length segment of colon, clinically recto-sigmoid, which includes at least probable middle third of rectum. Surrounding mesorectum is incomplete and focally disrupted. There is an attached suture over the anterior perirectal surface. The mucosa is  tan pink, smooth, soft with normal intestinal folds. There are no mucosal lesions identified. Within the sigmoid portion are several unperforated diverticula. Found within the surrounding fat are 15 possible lymph nodes ranging from 0.1 to 0.6 cm. Block Summary: A = proximal margin B, C = distal margin D, E = sections taken at attached suture over anterior mesorectum F = diverticulum G = four nodes, whole H = four nodes, whole I = four nodes, whole J = three nodes, whole Total: 10 blocks submitted (SW:ds 06/07/15) 2 of 3 FINAL for ELLEIGH, CASSETTA ([id number]) Report signed out from the following location(s) Technical component and interpretation was performed at [facility], [place], [place] [address]. CLIA #: [id number], 3 of 3 Assessment  BETTER   Plan:  -STRICT I&O - challenge to get sense of what is going on w patient claiming better UOP & ileostomy output than recordered.  D/w [place] Ar RN whom will stay on top of it & try to re-encourage patient to help with maintaining strict I&O  -adv diet  -stop IVF  -set up [facility] IVF qMWF to avoid dehydration since orthostatic & fait PO intake to avoid ARF/readmission  -lytes OK - follow  -OSTOMY CARE / TRAINING  -VTE prophylaxis- SCDs, etc  -GERD - PPI  -hyperlipidemia - Tx  -HTN - 1/2 dose ACE Inh with chr diarrhea (diuretic effect) w ileostomy  -h/o PE - inc ASA.  Restart Eliquis soon if tol solids & ileus resolved - prob tomorrow  -mobilize as tolerated to help recovery  -Pathology - ?+margin most likely fracture of GIST exposed.  Most likely will get gleevec post-adjuvant for 3 years total per Dr Benay Spice.  D/w him again today  -CT scan if worse  D/C patient from hospital when patient meets criteria (anticipate in 1-2 day(s)):  Tolerating oral intake well Ambulating well Adequate pain control without IV medications Urinating  Having flatus Ostomy training Disposition planning in  place - esp. HH arranged for ileostomy training/care & IVF boluses at home   Adin [name], M.D., F.A.C.S. Gastrointestinal and Minimally Invasive Surgery [facility] [address], [place], [place] [address] [phone] Main / Paging   06/14/2015  Subjective:  "I feel MUCH better" Walking well in hallways Smiling, relaxed No anorectal pain Minimal RUQ pain at ileostomy    Objective:  Vital signs:  Filed Vitals:   06/13/15 0600 06/13/15 1400 06/13/15 2026 06/14/15 0621  BP: 158/78 179/81 152/79 160/74  Pulse:  118 102 95  Temp:  99.5 F (37.5 C) 99.3 F (37.4 C) 98.7 F (37.1 C)  TempSrc:  Oral Oral Oral  Resp:  16 16   Height:      Weight:    99.8 kg (220 lb 0.3 oz)  SpO2:   99% 100%    Last BM Date: 06/13/15  Intake/Output   Yesterday:  02/12 0701 - 02/13 0700 In: 2433.3 [P.O.:1080; I.V.:1353.3] Out: 2510 [Urine:2200; Drains:15; Stool:295] This shift:  Total I/O In: 1067.5 [P.O.:480; I.V.:587.5] Out: 595 [Urine:300; Stool:295]  Bowel function:  Flatus: YES  BM: thick effluent in bag.  Occ small BM out anus  Drain: serosanguinous  Physical Exam:  General: Pt awake/alert/oriented x4 in no acute distress.  Smiling/relaxed Eyes: PERRL, normal EOM.  Sclera clear.  No icterus Neuro: CN II-XII intact w/o focal sensory/motor deficits. Lymph: No head/neck/groin lymphadenopathy Psych:  No delerium/psychosis/paranoia HENT: Normocephalic, Mucus membranes moist.  No thrush Neck: Supple, No tracheal deviation Chest:  No chest wall pain w good excursion CV:  Pulses intact.  Regular rhythm MS: Normal AROM mjr joints.  No obvious deformity Abdomen: Soft but morbidly obese.  Nondistended.  Nontender.  No fluctuance/cellultis.  No guarding.  No evidence of peritonitis.  No incarcerated hernias.  Duskiness 50% of loop ilsostomy/ min edema at ileostomy - stable.  No necrosis Ext:  SCDs BLE.  No mjr edema.  No cyanosis Skin: No  petechiae / purpura  Results:   Labs: Results for orders placed or performed during the hospital encounter of 06/04/15 (from the past 48 hour(s))  Basic metabolic panel     Status: Abnormal   Collection Time: 06/13/15  5:45 AM  Result Value Ref Range   Sodium 139 135 - 145 mmol/L   Potassium 4.3 3.5 - 5.1 mmol/L   Chloride 106 101 - 111 mmol/L   CO2 22 22 - 32 mmol/L   Glucose, Bld 73 65 - 99 mg/dL   BUN 11 6 - 20 mg/dL   Creatinine, Ser 0.92 0.44 - 1.00 mg/dL   Calcium 8.4 (L) 8.9 - 10.3 mg/dL   GFR calc non Af Amer >60 >60 mL/min   GFR calc Af Amer >60 >60 mL/min    Comment: (NOTE) The eGFR has been calculated using the CKD EPI equation. This calculation has not been validated in all clinical situations. eGFR's persistently <60 mL/min signify possible Chronic Kidney Disease.    Anion gap 11 5 - 15  CBC     Status: Abnormal   Collection Time: 06/13/15  5:45 AM  Result Value Ref Range   WBC 10.5 4.0 - 10.5 K/uL   RBC 2.70 (L) 3.87 - 5.11 MIL/uL   Hemoglobin 7.7 (L) 12.0 - 15.0 g/dL   HCT 24.0 (L) 36.0 - 46.0 %   MCV 88.9 78.0 - 100.0 fL   MCH 28.5 26.0 - 34.0 pg   MCHC 32.1 30.0 - 36.0 g/dL   RDW 15.4 11.5 - 15.5 %   Platelets 321 150 - 400 K/uL  CBC     Status: Abnormal   Collection Time: 06/14/15  3:50 AM  Result Value Ref Range   WBC 10.6 (H) 4.0 - 10.5 K/uL   RBC 2.88 (L) 3.87 - 5.11 MIL/uL   Hemoglobin 8.3 (L) 12.0 - 15.0 g/dL   HCT 25.4 (L) 36.0 - 46.0 %   MCV 88.2 78.0 - 100.0 fL   MCH 28.8 26.0 - 34.0 pg   MCHC 32.7 30.0 - 36.0 g/dL   RDW 15.2 11.5 - 15.5 %   Platelets 371 150 - 400 K/uL  Basic metabolic panel     Status: Abnormal   Collection Time: 06/14/15  3:50 AM  Result Value Ref Range   Sodium 139 135 - 145 mmol/L   Potassium 3.8 3.5 - 5.1 mmol/L   Chloride 104 101 - 111 mmol/L   CO2 24 22 - 32 mmol/L   Glucose, Bld 88 65 - 99 mg/dL   BUN 8 6 - 20 mg/dL   Creatinine, Ser 0.86 0.44 - 1.00 mg/dL   Calcium 8.8 (L) 8.9 - 10.3 mg/dL   GFR calc non  Af Amer >60 >60 mL/min   GFR calc Af Amer >60 >60 mL/min    Comment: (NOTE) The eGFR has been calculated using the CKD EPI equation. This calculation has not been validated in all clinical situations. eGFR's persistently <60 mL/min signify possible Chronic Kidney Disease.    Anion gap 11 5 - 15  Imaging / Studies: No results found.  Medications / Allergies: per chart  Antibiotics: Anti-infectives    Start     Dose/Rate Route Frequency Ordered Stop   06/05/15 0200  cefoTEtan (CEFOTAN) 2 g in dextrose 5 % 50 mL IVPB     2 g 100 mL/hr over 30 Minutes Intravenous Every 12 hours 06/04/15 1702 06/05/15 0222   06/04/15 1044  clindamycin (CLEOCIN) 900 mg, gentamicin (GARAMYCIN) 240 mg in sodium chloride 0.9 % 1,000 mL for intraperitoneal lavage  Status:  Discontinued       As needed 06/04/15 1044 06/04/15 1545   06/04/15 0613  cefoTEtan (CEFOTAN) 2 g in dextrose 5 % 50 mL IVPB     2 g 100 mL/hr over 30 Minutes Intravenous On call to O.R. 06/04/15 [id number] 06/04/15 1345   06/03/15 1400  clindamycin (CLEOCIN) 900 mg, gentamicin (GARAMYCIN) 240 mg in sodium chloride 0.9 % 1,000 mL for intraperitoneal lavage  Status:  Discontinued    Comments:  Pharmacy may adjust dosing strength, schedule, rate of infusion, etc as needed to optimize therapy    Intraperitoneal To Surgery 06/03/15 1351 06/04/15 1703        Note: Portions of this report may have been transcribed using voice recognition software. Every effort was made to ensure accuracy; however, inadvertent computerized transcription errors may be present.   Any transcriptional errors that result from this process are unintentional.     Adin [name], M.D., F.A.C.S. Gastrointestinal and Minimally Invasive Surgery [facility] [address], [place], [place] [address] [phone] Main / Paging   06/14/2015  CARE TEAM:  PCP: Cathlean Cower, MD  Outpatient Care Team: Patient Care Team: Biagio Borg, MD as PCP - General Michael Boston, MD as Consulting Physician (General Surgery) Ladell Pier, MD as Consulting Physician (Oncology) Milus Banister, MD as Consulting Physician (Gastroenterology) Billy Fischer, MD as Consulting Physician (Family Medicine)  Inpatient Treatment Team: Treatment Team: Attending Provider: Michael Boston, MD; Technician: Tenna Child, [place]; Registered Nurse: July D Aguilar, RN; Technician: Coralie Carpen, NT; Consulting Physician: Ladell Pier, MD; Registered Nurse: Guadalupe Dawn, RN; Registered Nurse: Celedonio Savage, RN; Technician: Milda Smart Dehart, NT

## 2015-06-14 NOTE — Progress Notes (Signed)
Pt knows to empty pouch when it is 1/3 to 1/2 full.  Pt returns demonstration on emptying pouch and uses toilet paper to clean inside of pouch after emptying.  Pt performs independently.  Pt states she is comfortable with emptying pouch but not as yet with changing appliance.

## 2015-06-15 ENCOUNTER — Inpatient Hospital Stay (HOSPITAL_COMMUNITY): Payer: Commercial Managed Care - HMO

## 2015-06-15 LAB — BASIC METABOLIC PANEL
Anion gap: 12 (ref 5–15)
BUN: 7 mg/dL (ref 6–20)
CALCIUM: 8.8 mg/dL — AB (ref 8.9–10.3)
CHLORIDE: 102 mmol/L (ref 101–111)
CO2: 25 mmol/L (ref 22–32)
CREATININE: 0.92 mg/dL (ref 0.44–1.00)
GFR calc non Af Amer: >60 mL/min (ref >60)
Glucose, Bld: 86 mg/dL (ref 65–99)
Potassium: 3.9 mmol/L (ref 3.5–5.1)
SODIUM: 139 mmol/L (ref 135–145)

## 2015-06-15 LAB — CBC
HCT: 25.5 % — ABNORMAL LOW (ref 36.0–46.0)
HEMOGLOBIN: 8.3 g/dL — AB (ref 12.0–15.0)
MCH: 28.5 pg (ref 26.0–34.0)
MCHC: 32.5 g/dL (ref 30.0–36.0)
MCV: 87.6 fL (ref 78.0–100.0)
Platelets: 400 10*3/uL (ref 150–400)
RBC: 2.91 MIL/uL — ABNORMAL LOW (ref 3.87–5.11)
RDW: 15 % (ref 11.5–15.5)
WBC: 9.7 10*3/uL (ref 4.0–10.5)

## 2015-06-15 MED ORDER — LACTATED RINGERS IV BOLUS (SEPSIS)
1000.0000 mL | Freq: Once | INTRAVENOUS | Status: AC
  Administered 2015-06-15: 1000 mL via INTRAVENOUS

## 2015-06-15 MED ORDER — HEPARIN SOD (PORK) LOCK FLUSH 100 UNIT/ML IV SOLN
250.0000 [IU] | INTRAVENOUS | Status: AC | PRN
  Administered 2015-06-15: 250 [IU]

## 2015-06-15 MED ORDER — METOCLOPRAMIDE HCL 5 MG/ML IJ SOLN
5.0000 mg | Freq: Three times a day (TID) | INTRAMUSCULAR | Status: DC
  Administered 2015-06-15 (×3): 5 mg via INTRAVENOUS
  Filled 2015-06-15: qty 2
  Filled 2015-06-15 (×2): qty 1
  Filled 2015-06-15: qty 2
  Filled 2015-06-15 (×2): qty 1

## 2015-06-15 NOTE — Progress Notes (Signed)
Radiology report called to Dr Kieth Brightly diet orders changed

## 2015-06-15 NOTE — Discharge Instructions (Signed)
Ostomy Support Information ° °You’ve heard that people get along just fine with only one of their eyes, or one of their lungs, or one of their kidneys. But you also know that you have only one intestine and only one bladder, and that leaves you feeling awfully empty, both physically and emotionally: You think no other people go around without part of their intestine with the ends of their intestines sticking out through their abdominal walls.  ° °YOU ARE NOT ALONE.  There are nearly three quarters of a million people in the [place] who have an ostomy; people who have had surgery to remove all or part of their colons or bladders.  ° °There is even a national association, the [employer] with over 350 local affiliated support groups that are organized by volunteers who provide peer support and counseling. [employer] has a toll free telephone num-ber, 800-826-0826 and an educational, interactive website, www.ostomy.org  ° °An ostomy is an opening in the belly (abdominal wall) made by surgery. Ostomates are people who have had this procedure. The opening (stoma) allows the kidney or bowel to discharge waste. An external pouch covers the stoma to collect waste. Pouches are are a simple bag and are odor free. Different companies have disposable or reusable pouches to fit one's lifestyle. An ostomy can either be temporary or permanent.  ° °THERE ARE THREE MAIN TYPES OF OSTOMIES °· Colostomy. A colostomy is a surgically created opening in the large intestine (colon). °· Ileostomy. An ileostomy is a surgically created opening in the small intestine. °· Urostomy. A urostomy is a surgically created opening to divert urine away from the bladder. ° °OSTOMY Care ° °The following guidelines will make care of your colostomy easier. Keep this information close by for quick reference. ° °Helpful DIET hints °Eat a well-balanced diet including vegetables and fresh fruits. Eat on a regular schedule. Drink at least 6 to 8  glasses of fluids daily. °Eat slowly in a relaxed atmosphere. Chew your food thoroughly. Avoid chewing gum, smoking, and drinking from a straw. This will help decrease the amount of air you swallow, which may help reduce gas. °Eating yogurt or drinking buttermilk may help reduce gas. ° °To control gas at night, do not eat after 8 p.m. This will give your bowel time to quiet down before you go to bed. ° °If gas is a problem, you can purchase Beano. Sprinkle Beano on the first bite of food before eating to reduce gas. It has no flavor and should not change the taste of your food. You can buy Beano over the counter at your local drugstore. ° °Foods like fish, onions, garlic, broccoli, asparagus, and cabbage produce odor. Although your pouch is odor-proof, if you eat these foods you may notice a stronger odor when emptying your pouch. If this is a concern, you may want to limit these foods in your diet. ° °If you have an ileostomy, you will have chronic diarrhea & need to drink more liquids to avoid getting dehydrated.  Consider antidiarrheal medicine like imodium (loperamide) or Lomotil to help slow down bowel movements / diarrhea into your ileostomy bag. ° °GETTING TO GOOD BOWEL HEALTH WITH AN ILEOSTOMY °. °Irregular bowel habits such as constipation and diarrhea can lead to many problems over time.  The goal: 3-6 small BOWEL MOVEMENTS A DAY!  To have soft, regular bowel movements:  °• Drink plenty of fluids, consider 4-6 tall glasses of water a day.   ° °Controlling   diarrheA ° °o Switch to liquids and simpler foods for a few days to avoid stressing your intestines further. °o Avoid dairy products (especially milk & ice cream) for a short time.  The intestines often can lose the ability to digest lactose when stressed. °o Avoid foods that cause gassiness or bloating.  Typical foods include beans and other legumes, cabbage, broccoli, and dairy foods.  Every person has some sensitivity to other foods, so listen to our  body and avoid those foods that trigger problems for you. °o Adding fiber (Citrucel, Metamucil, psyllium, Miralax) gradually can help thicken stools by absorbing excess fluid and retrain the intestines to act more normally.  Slowly increase the dose over a few weeks.  Too much fiber too soon can backfire and cause cramping & bloating. °o Probiotics (such as active yogurt, Align, etc) may help repopulate the intestines and colon with normal bacteria and calm down a sensitive digestive tract.  Most studies show it to be of mild help, though, and such products can be costly. °o Medicines: °- Bismuth subsalicylate (ex. Kayopectate, Pepto Bismol) every 30 minutes for up to 6 doses can help control diarrhea.  Avoid if pregnant. °- Loperamide (Immodium) can slow down diarrhea.  Start with two tablets (4mg total) first and then try one tablet every 6 hours.  Avoid if you are having fevers or severe pain.  If you are not better or start feeling worse, stop all medicines and call your doctor for advice °o Call your doctor if you are getting worse or not better.  Sometimes further testing (cultures, endoscopy, X-ray studies, bloodwork, etc) may be needed to help diagnose and treat the cause of the diarrhea. ° °TROUBLESHOOTING IRREGULAR BOWELS °1) Avoid extremes of bowel movements (no bad constipation/diarrhea) °2) Miralax 17gm mixed in 8oz. water or juice-daily. May use twice a day as needed  °3) Gas-x,Phazyme, etc. as needed for gas & bloating.  °4) Soft,bland diet. No spicy,greasy,fried foods.  °5) Prilosec (omeprazole) over-the-counter as needed  °6) May hold gluten/wheat products from diet to see if symptoms improve.  °7)  May try probiotics (Align, Activa, etc) to help calm the bowels down °7) If symptoms become worse call back immediately. ° ° °Applying the pouching system °To apply your pouch, follow these steps: ° °Place all your equipment close at hand before removing your pouch. ° °Wash your hands. ° °Stand or sit in  front of a mirror. Use the position that works best for you. Remember that you must keep the skin around the stoma wrinkle-free for a good seal. ° °Gently remove the used pouch (1-piece system) or the pouch and old wafer (2-piece system). Empty the pouch into the toilet. Save the closure clip to use again. ° °Wash the stoma itself and the skin around the stoma. Your stoma may bleed a little when being washed. This is normal. Rinse and pat dry. You may use a wash cloth or soft paper towels (like Bounty), mild soap (like Dial, Safeguard, or Ivory), and water. Avoid soaps that contain perfumes or lotions. ° °For a new pouch (1-piece system) or a new wafer (2-piece system), measure your stoma using the stoma guide in each box of supplies. ° °Trace the shape of your stoma onto the back of the new pouch or the back of the new wafer. Cut out the opening. Remove the paper backing and set it aside. ° °Optional: Apply a skin barrier powder to surrounding skin if it is irritated (bare or weeping),   and dust off the excess. °Optional: Apply a skin-prep wipe (such as Skin Prep or All-Kare) to the skin around the stoma, and let it dry. Do not apply this solution if the skin is irritated (red, tender, or broken) or if you have shaved around the stoma. °Optional: Apply a skin barrier paste (such as Stomahesive, Coloplast, or Premium) around the opening cut in the back of the pouch or wafer. Allow it to dry for 30 to 60 seconds. ° °Hold the pouch (1-piece system) or wafer (2-piece system) with the sticky side toward your body. Make sure the skin around the stoma is wrinkle-free. Center the opening on the stoma, then press firmly to your abdomen (Fig. 4). Look in the mirror to check if you are placing the pouch, or wafer, in the right position. For a 2-piece system, snap the pouch onto the wafer. Make sure it snaps into place securely. ° °Place your hand over the stoma and the pouch or wafer for about 30 seconds. The heat from your  hand can help the pouch or wafer stick to your skin. ° °Add deodorant (such as Super Banish or Nullo) to your pouch. Other options include food extracts such as vanilla oil and peppermint extract. Add about 10 drops of the deodorant to the pouch. Then apply the closure clamp. Note: Do not use toxic ° chemicals or commercial cleaning agents in your pouch. These substances may harm the stoma. ° °Optional: For extra seal, apply tape to all 4 sides around the pouch or wafer, as if you were framing a picture. You may use any brand of medical adhesive tape. °Change your pouch every 5 to 7 days. Change it immediately if a leak occurs.  Wash your hands afterwards. ° °If you are wearing a 2-piece system, you may use 2 new pouches per week and alternate them. Rinse the pouch with mild soap and warm water and hang it to dry for the next day. Apply the fresh pouch. Alternate the 2 pouches like this for a week. After a week, change the wafer and begin with 2 new pouches. Place the old pouches in a plastic bag, and put them in the trash. ° ° ° °Tips for colostomy care ° °Applying Your Pouch °You may stand or sit to apply your pouch. ° °Keep the skin where you apply the pouch wrinkle-free. If the skin around the pouch is wrinkled, the seal may break when your skin stretches. ° °If hair grows close to your stoma, you may trim off the hair with scissors, an electric razor, or a safety razor. ° °Always have a mirror nearby so you can get a better view of your stoma. ° °When you apply a new pouch, write the date on the adhesive tape. This will remind you of when you last changed your pouch. ° °Changing Your Pouch °The best time to change your pouch is in the morning, before eating or drinking anything. Your stoma can function at any time, but it will function more after eating or drinking. ° °Emptying Your Pouch °Empty your pouch when it is one-third full (of urine, stool, and/or gas). If you wait until your pouch is fuller than this,  it will be more difficult to empty and more noticeable. °When you empty your pouch, either put toilet paper in the toilet bowl first, or flush the toilet while you empty the pouch. This will reduce splashing. You can empty the pouch between your legs or to one side while sitting,   or while standing or stooping. If you have a 2-piece system, you can snap off the pouch to empty it. Remember that your stoma may function during this time. °If you wish to rinse your pouch after you empty it, a turkey baster can be helpful. When using a baster, squirt water up into the pouch through the opening at the °bottom. With a 2-piece system, you can snap off the pouch to rinse it. After rinsing  your pouch, empty it into the toilet. °When rinsing your pouch at home, put a few granules of Dreft soap in the rinse water. This helps lubricate and freshen your pouch. °The inside of your pouch can be sprayed with non-stick cooking oil (Pam spray). This may help reduce stool sticking to the inside of the pouch. ° °Bathing °You may shower or bathe with your pouch on or off. Remember that your stoma may function during this time. ° °The materials you use to wash your stoma and the skin around it should be clean, but they do not need to be sterile. ° °Wearing Your Pouch °During hot weather, or if you perspire a lot in general, wear a cover over your pouch. This may prevent a rash on your skin under the pouch. Pouch covers are sold at ostomy supply stores. °Wear the pouch inside your underwear for better support. °Watch your weight. Any gain or loss of 10 to 15 pounds or more can change the way your pouch fits. ° °Going Away From Home °A collapsible cup (like those that come in travel kits) or a soft plastic squirt bottle with a pull-up top (like a travel bottle for shampoo) can be used for rinsing your pouch when you are away from home. Tilt the opening of the pouch at an upward angle when using a cup to rinse. ° °Carry wet wipes or extra  tissues to use in public bathrooms. ° °Carry an extra pouching system with you at all times. ° °Never keep ostomy supplies in the glove compartment of your car. Extreme heat or cold can damage the skin barriers and adhesive wafers on the pouch. ° °When you travel, carry your ostomy supplies with you at all times. Keep them within easy reach. Do not pack ostomy supplies in baggage that will be checked or otherwise separated from you, because your baggage might be lost. If you’re traveling out of the country, it is helpful to have a letter stating that you are carrying ostomy supplies as a medical necessity. ° °If you need ostomy supplies while traveling, look in the yellow pages of the telephone book under “Surgical Supplies.” Or call the local ostomy organization to find out where supplies are available. ° °Do not let your ostomy supplies get low. Always order new pouches before you use the last one. ° °Reducing Odor °Limit foods such as broccoli, cabbage, onions, fish, and garlic in your diet to help reduce odor. °Each time you empty your pouch, carefully clean the opening of the pouch, both inside and outside, with toilet paper. °Rinse your pouch 1 or 2 times daily after you empty it (see directions for emptying your pouch and going away from home). °Add deodorant (such as Super Banish or Nullo) to your pouch. °Use air deodorizers in your bathroom. °Do not add aspirin to your pouch. Even though aspirin can help prevent odor, it could cause ulcers on your stoma. ° °When to call the doctor °Call the doctor if you have any of the following symptoms: °Purple, black,   or white stoma Severe cramps lasting more than 6 hours Severe watery discharge from the stoma lasting more than 6 hours No output from the colostomy for 3 days Excessive bleeding from your stoma Swelling of your stoma to more than 1/2-inch larger than usual Pulling inward of your stoma below skin level Severe skin irritation or deep ulcers Bulging  or other changes in your abdomen  When to call your ostomy nurse Call your ostomy/enterostomal therapy (ET) nurse if any of the following occurs: Frequent leaking of your pouching system Change in size or appearance of your stoma, causing discomfort or problems with your pouch Skin rash or rawness Weight gain or loss that causes problems with your pouch      FREQUENTLY ASKED QUESTIONS   Why havent you met any of these folks who have an ostomy?  Well, maybe you have! You just did not recognize them because an ostomy doesn't show. It can be kept secret if you wish. Why, maybe some of your best friends, office associates or neighbors have an ostomy ... you never can tell.   People facing ostomy surgery have many quality-of-life questions like:  Will you bulge? Smell? Make noises? Will you feel waste leaving your body? Will you be a captive of the toilet? Will you starve? Be a social outcast? Get/stay married? Have babies? Easily bathe, go swimming, bend over?  OK, lets look at what you can expect:   Will you bulge?  Remember, without part of the intestine or bladder, and its contents, you should have a flatter tummy than before. You can expect to wear, with little exception, what you wore before surgery ... and this in-cludes tight clothing and bathing suits.   Will you smell?  Today, thanks to modern odor proof pouching systems, you can walk into an ostomy support group meeting and not smell anything that is foul or offensive. And, for those with an ileostomy or colostomy who are concerned about odor when emptying their pouch, there are in-pouch deodorants that can be used to eliminate any waste odors that may exist.   Will you make noises?  Everyone produces gas, especially if they are an air-swallower. But intestinal sounds that occur from time to time are no differ-ent than a gurgling tummy, and quite often your clothing will muffle any sounds.   Will you feel the waste discharges?   For those with a colostomy or ileostomy there might be a slight pressure when waste leaves your body, but understand that the intestines have no nerve endings, so there will be no unpleasant sensations. Those with a urostomy will probably be unaware of any kidney drainage.   Will you be a captive of the toilet?  Immediately post-op you will spend more time in the bathroom than you will after your body recovers from surgery. Every person is different, but on average those with an ileostomy or urostomy may empty their pouches 4 to 6 times a day; a little  less if you have a colostomy. The average wear time between pouch system changes is 3 to 5 days and the changing process should take less than 30 minutes.   Will I need to be on a special diet? Most people return to their normal diet when they have recovered from surgery. Be sure to chew your food well, eat a well-balanced diet and drink plenty of fluids. If you experience problems with a certain food, wait a couple of weeks and try it again.  Will there be  odor and noises? °Pouching systems are designed to be odor-proof or odor-resistant. There are deodorants that can be used in the pouch. Medications are also available to help reduce odor. Limit gas-producing foods and carbonated beverages. You will experience less gas and fewer noises as you heal from surgery. ° °How much time will it take to care for my ostomy? °At first, you may spend a lot of time learning about your ostomy and how to take care of it. As you become more comfortable and skilled at changing the pouching system, it will take very little time to care for it.  ° °Will I be able to return to work? °People with ostomies can perform most jobs. As soon as you have healed from surgery, you should be able to return to work. Heavy lifting (more than 10 pounds) may be discouraged.  ° °What about intimacy? °Sexual relationships and intimacy are important and fulfilling aspects of your life. They  should continue after ostomy surgery. Intimacy-related concerns should be discussed openly between you and your partner.  ° °Can I wear regular clothing? °You do not need to wear special clothing. Ostomy pouches are fairly flat and barely noticeable. Elastic undergarments will not hurt the stoma or prevent the ostomy from functioning.  ° °Can I participate in sports? °An ostomy should not limit your involvement in sports. Many people with ostomies are runners, skiers, swimmers or participate in other active lifestyles. Talk with your caregiver first before doing heavy physical activity. ° °Will you starve?  °Not if you follow doctor’s orders at each stage of your post-op adjustment. There is no such thing as an “ostomy diet”. Some people with an ostomy will be able to eat and tolerate anything; others may find diffi-culty with some foods. Each person is an individual and must determine, by trial, what is best for them. A good practice for all is to drink plenty of water.  ° °Will you be a social outcast?  °Have you met anyone who has an ostomy and is a social outcast? Why should you be the first? Only your attitude and self image will effect how you are treated. No confi-dent person is an outcast.  ° ° ° °PROFESSIONAL HELP  °Resources are available if you need help or have questions about your ostomy.  ° °· Specially trained nurses called Wound, Ostomy Continence Nurses (WOCN) are available for consultation in most major medical centers. ° °· Consider getting an ostomy consult with Kathy Probst at [facility] to help troubleshoot stoma pouch fittings and other issues with your ostomy: [phone] ° °· The United Ostomy Association (UOA) is a group made up of many local chapters throughout the [place]. These local groups hold meetings and provide support to prospective and existing ostomates. They sponsor educational events and have qualified visitors to make personal or telephone visits. Contact  the UOA for the chapter nearest you and for other educational publications. ° °· More detailed information can be found in Colostomy Guide, a publication of the United Ostomy Association (UOA). Contact UOA at 1-800-826-0826 or visit their web site at www.uoaa.org. The website contains links to other sites, suppliers and resources. ° °· [facility] Secure Start Services: °· Start at the website to enlist for support.  Your Wound Ostomy (WOCN) nurse may have started this process. https://www.[facility].com/en/securestart °· Secure Start services are designed to support people as they live their lives with an ostomy or neurogenic bladder. Enrolling is easy and at no cost to the   patient. We realize that each person's needs and life journey are different. Through Secure Start services, we want to help people live their life, their way.      SURGERY: POST OP INSTRUCTIONS (Surgery for small bowel obstruction, colon resection, etc)    DIET Follow a light diet the first few days at home.  Start with a bland diet such as soups, liquids, starchy foods, low fat foods, etc.  If you feel full, bloated, or constipated, stay on a ful liquid or pureed/blenderized diet for a few days until you feel better and no longer constipated. Be sure to drink plenty of fluids every day to avoid getting dehydrated (feeling dizzy, not urinating, etc.). Gradually add a fiber supplement to your diet over the next week.  Gradually get back to a regular solid diet.  Avoid fast food or heavy meals the first week as you are more likely to get nauseated. It is expected for your digestive tract to need a few months to get back to normal.  It is common for your bowel movements and stools to be irregular.  You will have occasional bloating and cramping that should eventually fade away.  Until you are eating solid food normally, off all pain medications, and back to regular activities; your bowels will not be normal. Focus on eating a low-fat,  high fiber diet the rest of your life (See Getting to [place], below).  CARE of your INCISION or WOUND It is good for closed incision and even open wounds to be washed every day.  Shower every day.  Short baths are fine.  Wash the incisions and wounds clean with soap & water.    If you have a closed incision(s), wash the incision with soap & water every day.  You may leave closed incisions open to air if it is dry.   You may cover the incision with clean gauze & replace it after your daily shower for comfort. If you have skin tapes (Steristrips) or skin glue (Dermabond) on your incision, leave them in place.  They will fall off on their own like a scab.  You may trim any edges that curl up with clean scissors.  If you have staples, set up an appointment for them to be removed in the office in 10 days after surgery.  If you have a drain, wash around the skin exit site with soap & water and place a new dressing of gauze or band aid around the skin every day.  Keep the drain site clean & dry.    If you have an open wound with packing, see wound care instructions.  In general, it is encouraged that you remove your dressing and packing, shower with soap & water, and replace your dressing once a day.  Pack the wound with clean gauze moistened with normal (0.9%) saline to keep the wound moist & uninfected.  Pressure on the dressing for 30 minutes will stop most wound bleeding.  Eventually your body will heal & pull the open wound closed over the next few months.  Raw open wounds will occasionally bleed or secrete yellow drainage until it heals closed.  Drain sites will drain a little until the drain is removed.  Even closed incisions can have mild bleeding or drainage the first few days until the skin edges scab over & seal.   If you have an open wound with a wound vac, see wound vac care instructions.     ACTIVITIES as tolerated  Start light daily activities --- self-care, walking, climbing  stairs-- beginning the day after surgery.  Gradually increase activities as tolerated.  Control your pain to be active.  Stop when you are tired.  Ideally, walk several times a day, eventually an hour a day.   Most people are back to most day-to-day activities in a few weeks.  It takes 4-8 weeks to get back to unrestricted, intense activity. If you can walk 30 minutes without difficulty, it is safe to try more intense activity such as jogging, treadmill, bicycling, low-impact aerobics, swimming, etc. Save the most intensive and strenuous activity for last (Usually 4-8 weeks after surgery) such as sit-ups, heavy lifting, contact sports, etc.  Refrain from any intense heavy lifting or straining until you are off narcotics for pain control.  You will have off days, but things should improve week-by-week. DO NOT PUSH THROUGH PAIN.  Let pain be your guide: If it hurts to do something, don't do it.  Pain is your body warning you to avoid that activity for another week until the pain goes down. You may drive when you are no longer taking narcotic prescription pain medication, you can comfortably wear a seatbelt, and you can safely make sudden turns/stops to protect yourself without hesitating due to pain. You may have sexual intercourse when it is comfortable. If it hurts to do something, stop.  MEDICATIONS Take your usually prescribed home medications unless otherwise directed.   Blood thinners:  Usually you can restart any strong blood thinners after the second postoperative day.  It is OK to take aspirin right away.     If you are on strong blood thinners (warfarin/Coumadin, Plavix, Xerelto, Eliquis, Pradaxa, etc), discuss with your surgeon, medicine PCP, and/or cardiologist for instructions on when to restart the blood thinner & if blood monitoring is needed (PT/INR blood check, etc).     PAIN CONTROL Pain after surgery or related to activity is often due to strain/injury to muscle, tendon, nerves  and/or incisions.  This pain is usually short-term and will improve in a few months.  To help speed the process of healing and to get back to regular activity more quickly, DO THE FOLLOWING THINGS TOGETHER: 1. Increase activity gradually.  DO NOT PUSH THROUGH PAIN 2. Use Ice and/or Heat 3. Try Gentle Massage and/or Stretching 4. Take over the counter pain medication 5. Take Narcotic prescription pain medication for more severe pain  Good pain control = faster recovery.  It is better to take more medicine to be more active than to stay in bed all day to avoid medications. 1.  Increase activity gradually Avoid heavy lifting at first, then increase to lifting as tolerated over the next 6 weeks. Do not push through the pain.  Listen to your body and avoid positions and maneuvers than reproduce the pain.  Wait a few days before trying something more intense Walking an hour a day is encouraged to help your body recover faster and more safely.  Start slowly and stop when getting sore.  If you can walk 30 minutes without stopping or pain, you can try more intense activity (running, jogging, aerobics, cycling, swimming, treadmill, sex, sports, weightlifting, etc.) Remember: If it hurts to do it, then dont do it! 2. Use Ice and/or Heat You will have swelling and bruising around the incisions.  This will take several weeks to resolve. Ice packs or heating pads (6-8 times a day, 30-60 minutes at a time) will help sooth soreness & bruising.  Some people prefer to use ice alone, heat alone, or alternate between ice & heat.  Experiment and see what works best for you.  Consider trying ice for the first few days to help decrease swelling and bruising; then, switch to heat to help relax sore spots and speed recovery. Shower every day.  Short baths are fine.  It feels good!  Keep the incisions and wounds clean with soap & water.   3. Try Gentle Massage and/or Stretching Massage at the area of pain many times a  day Stop if you feel pain - do not overdo it 4. Take over the counter pain medication This helps the muscle and nerve tissues become less irritable and calm down faster Choose ONE of the following over-the-counter anti-inflammatory medications: Acetaminophen '500mg'$  tabs (Tylenol) 1-2 pills with every meal and just before bedtime (avoid if you have liver problems or if you have acetaminophen in you narcotic prescription) Naproxen '220mg'$  tabs (ex. Aleve, Naprosyn) 1-2 pills twice a day (avoid if you have kidney, stomach, IBD, or bleeding problems) Ibuprofen '200mg'$  tabs (ex. Advil, Motrin) 3-4 pills with every meal and just before bedtime (avoid if you have kidney, stomach, IBD, or bleeding problems) Take with food/snack several times a day as directed for at least 2 weeks to help keep pain / soreness down & more manageable. 5. Take Narcotic prescription pain medication for more severe pain A prescription for strong pain control is often given to you upon discharge (for example: oxycodone/Percocet, hydrocodone/Norco/Vicodin, or tramadol/Ultram) Take your pain medication as prescribed. Be mindful that most narcotic prescriptions contain Tylenol (acetaminophen) as well - avoid taking too much Tylenol. If you are having problems/concerns with the prescription medicine (does not control pain, nausea, vomiting, rash, itching, etc.), please call us [phone] to see if we need to switch you to a different pain medicine that will work better for you and/or control your side effects better. If you need a refill on your pain medication, you must call the office before 4 pm and on weekdays only.  By federal law, prescriptions for narcotics cannot be called into a pharmacy.  They must be filled out on paper & picked up from our office by the patient or authorized caretaker.  Prescriptions cannot be filled after 4 pm nor on weekends.   WHEN TO CALL us [phone] Severe uncontrolled or worsening pain  Fever  over 101 F (38.5 C) Concerns with the incision: Worsening pain, redness, rash/hives, swelling, bleeding, or drainage Reactions / problems with new medications (itching, rash, hives, nausea, etc.) Nausea and/or vomiting Difficulty urinating Difficulty breathing Worsening fatigue, dizziness, lightheadedness, blurred vision Other concerns If you are not getting better after two weeks or are noticing you are getting worse, contact our office [phone] for further advice.  We may need to adjust your medications, re-evaluate you in the office, send you to the emergency room, or see what other things we can do to help. The clinic staff is available to answer your questions during regular business hours (8:30am-5pm).  Please dont hesitate to call and ask to speak to one of our nurses for clinical concerns.    A surgeon from [facility] is always on call at the hospitals 24 hours/day If you have a medical emergency, go to the nearest emergency room or call 911. FOLLOW UP in our office One the day of your discharge from the hospital (or the next business weekday), please call [facility] to set up or  confirm an appointment to see your surgeon in the office for a follow-up appointment.  Usually it is 2-3 weeks after your surgery.   If you have skin staples at your incision(s), let the office know so we can set up a time in the office for the nurse to remove them (usually around 10 days after surgery). Make sure that you call for appointments the day of discharge (or the next business weekday) from the hospital to ensure a convenient appointment time. IF YOU HAVE DISABILITY OR FAMILY LEAVE FORMS, BRING THEM TO THE OFFICE FOR PROCESSING.  DO NOT GIVE THEM TO YOUR DOCTOR.  [facility] [address] [place], [place],   [address] ? [phone] - Main [phone] - [place],  [phone] - Fax [email or web address]  GETTING TO  GOOD BOWEL HEALTH. It is expected for your digestive tract to need a few months to get back to normal.  It is common for your bowel movements and stools to be irregular.  You will have occasional bloating and cramping that should eventually fade away.  Until you are eating solid food normally, off all pain medications, and back to regular activities; your bowels will not be normal.   Avoiding constipation The goal: ONE SOFT BOWEL MOVEMENT A DAY!    Drink plenty of fluids.  Choose water first. TAKE A FIBER SUPPLEMENT EVERY DAY THE REST OF YOUR LIFE During your first week back home, gradually add back a fiber supplement every day Experiment which form you can tolerate.   There are many forms such as powders, tablets, wafers, gummies, etc Psyllium bran (Metamucil), methylcellulose (Citrucel), Miralax or Glycolax, Benefiber, Flax Seed.  Adjust the dose week-by-week (1/2 dose/day to 6 doses a day) until you are moving your bowels 1-2 times a day.  Cut back the dose or try a different fiber product if it is giving you problems such as diarrhea or bloating. Sometimes a laxative is needed to help jump-start bowels if constipated until the fiber supplement can help regulate your bowels.  If you are tolerating eating & you are farting, it is okay to try a gentle laxative such as double dose MiraLax, prune juice, or Milk of Magnesia.  Avoid using laxatives too often. Stool softeners can sometimes help counteract the constipating effects of narcotic pain medicines.  It can also cause diarrhea, so avoid using for too long. If you are still constipated despite taking fiber daily, eating solids, and a few doses of laxatives, call our office. Controlling diarrhea Try drinking liquids and eating bland foods for a few days to avoid stressing your intestines further. Avoid dairy products (especially milk & ice cream) for a short time.  The intestines often can lose the ability to digest lactose when stressed. Avoid  foods that cause gassiness or bloating.  Typical foods include beans and other legumes, cabbage, broccoli, and dairy foods.  Avoid greasy, spicy, fast foods.  Every person has some sensitivity to other foods, so listen to your body and avoid those foods that trigger problems for you. Probiotics (such as active yogurt, Align, etc) may help repopulate the intestines and colon with normal bacteria and calm down a sensitive digestive tract Adding a fiber supplement gradually can help thicken stools by absorbing excess fluid and retrain the intestines to act more normally.  Slowly increase the dose over a few weeks.  Too much fiber too soon can backfire and cause cramping & bloating. It is okay to try and slow  down diarrhea with a few doses of antidiarrheal medicines.   Bismuth subsalicylate (ex. Kayopectate, Pepto Bismol) for a few doses can help control diarrhea.  Avoid if pregnant.   Loperamide (Imodium) can slow down diarrhea.  Start with one tablet ('2mg'$ ) first.  Avoid if you are having fevers or severe pain.  ILEOSTOMY PATIENTS WILL HAVE CHRONIC DIARRHEA since their colon is not in use.    Drink plenty of liquids.  You will need to drink even more glasses of water/liquid a day to avoid getting dehydrated. Record output from your ileostomy.  Expect to empty the bag every 3-4 hours at first.  Most people with a permanent ileostomy empty their bag 4-6 times at the least.   Use antidiarrheal medicine (especially Imodium) several times a day to avoid getting dehydrated.  Start with a dose at bedtime & breakfast.  Adjust up or down as needed.  Increase antidiarrheal medications as directed to avoid emptying the bag more than 8 times a day (every 3 hours). Work with your wound ostomy nurse to learn care for your ostomy.  See ostomy care instructions. TROUBLESHOOTING IRREGULAR BOWELS 1) Start with a soft & bland diet. No spicy, greasy, or fried foods.  2) Avoid gluten/wheat or dairy products from diet to see if  symptoms improve. 3) Miralax 17gm or flax seed mixed in [place]. water or juice-daily. May use 2-4 times a day as needed. 4) Gas-X, Phazyme, etc. as needed for gas & bloating.  5) Prilosec (omeprazole) over-the-counter as needed 6)  Consider probiotics (Align, Activa, etc) to help calm the bowels down  Call your doctor if you are getting worse or not getting better.  Sometimes further testing (cultures, endoscopy, X-ray studies, CT scans, bloodwork, etc.) may be needed to help diagnose and treat the cause of the diarrhea. [facility], [place], [place], [place], [place]  [address] [phone] - Main.    [phone]  - Toll Free.   [phone] - Fax [email or web address]  GETTING TO GOOD BOWEL HEALTH. Irregular bowel habits such as constipation and diarrhea can lead to many problems over time.  Having one soft bowel movement a day is the most important way to prevent further problems.  The anorectal canal is designed to handle stretching and feces to safely manage our ability to get rid of solid waste (feces, poop, stool) out of our body.  BUT, hard constipated stools can act like ripping concrete bricks and diarrhea can be a burning fire to this very sensitive area of our body, causing inflamed hemorrhoids, anal fissures, increasing risk is perirectal abscesses, abdominal pain/bloating, an making irritable bowel worse.      The goal: 4-6 SOFT BOWEL MOVEMENTs A DAY!  To have soft, regular bowel movements:   Drink plenty of fluids, consider 4-6 tall glasses of water a day.      Controlling diarrhea o Switch to liquids and simpler foods for a few days to avoid stressing your intestines further. o Avoid dairy products (especially milk & ice cream) for a short time.  The intestines often can lose the ability to digest lactose when stressed. o Avoid foods that cause gassiness or bloating.  Typical foods include beans and other legumes, cabbage, broccoli,  and dairy foods.  Every person has some sensitivity to other foods, so listen to our body and avoid those foods that trigger problems for you. o Adding fiber (Citrucel, Metamucil, psyllium, Miralax) gradually can help thicken stools by absorbing excess fluid and  retrain the intestines to act more normally.  Slowly increase the dose over a few weeks.  Too much fiber too soon can backfire and cause cramping & bloating. o Probiotics (such as active yogurt, Align, etc) may help repopulate the intestines and colon with normal bacteria and calm down a sensitive digestive tract.  Most studies show it to be of mild help, though, and such products can be costly. o Medicines: - Bismuth subsalicylate (ex. Kayopectate, Pepto Bismol) every 30 minutes for up to 6 doses can help control diarrhea.  Avoid if pregnant. - Loperamide (Immodium) can slow down diarrhea.  Start with two tablets ('4mg'$  total) first and then try one tablet every 6 hours.  Avoid if you are having fevers or severe pain.  If you are not better or start feeling worse, stop all medicines and call your doctor for advice o Call your doctor if you are getting worse or not better.  Sometimes further testing (cultures, endoscopy, X-ray studies, bloodwork, etc) may be needed to help diagnose and treat the cause of the diarrhea.  TROUBLESHOOTING IRREGULAR BOWELS 1) Avoid extremes of bowel movements (no bad constipation/diarrhea) 2) Miralax 17gm mixed in 8oz. water or juice-daily. May use BID as needed.  3) Gas-x,Phazyme, etc. as needed for gas & bloating.  4) Soft,bland diet. No spicy,greasy,fried foods.  5) Prilosec over-the-counter as needed  6) May hold gluten/wheat products from diet to see if symptoms improve.  7)  May try probiotics (Align, Activa, etc) to help calm the bowels down 7) If symptoms become worse call back immediately.  Managing Pain  Pain after surgery or related to activity is often due to strain/injury to muscle, tendon, nerves  and/or incisions.  This pain is usually short-term and will improve in a few months.   Many people find it helpful to do the following things TOGETHER to help speed the process of healing and to get back to regular activity more quickly:  1. Avoid heavy physical activity at first a. No lifting greater than 20 pounds at first, then increase to lifting as tolerated over the next few weeks b. Do not push through the pain.  Listen to your body and avoid positions and maneuvers than reproduce the pain.  Wait a few days before trying something more intense c. Walking is okay as tolerated, but go slowly and stop when getting sore.  If you can walk 30 minutes without stopping or pain, you can try more intense activity (running, jogging, aerobics, cycling, swimming, treadmill, sex, sports, weightlifting, etc ) d. Remember: If it hurts to do it, then dont do it!  2. Take Anti-inflammatory medication a. Choose ONE of the following over-the-counter medications: i.            Acetaminophen '500mg'$  tabs (Tylenol) 1-2 pills with every meal and just before bedtime (avoid if you have liver problems) ii.            Naproxen '220mg'$  tabs (ex. Aleve) 1-2 pills twice a day (avoid if you have kidney, stomach, IBD, or bleeding problems) iii. Ibuprofen '200mg'$  tabs (ex. Advil, Motrin) 3-4 pills with every meal and just before bedtime (avoid if you have kidney, stomach, IBD, or bleeding problems) b. Take with food/snack around the clock for 1-2 weeks i. This helps the muscle and nerve tissues become less irritable and calm down faster  3. Use a Heating pad or Ice/Cold Pack a. 4-6 times a day b. May use warm bath/hottub  or showers  4. Try Gentle  Massage and/or Stretching  a. at the area of pain many times a day b. stop if you feel pain - do not overdo it  Try these steps together to help you body heal faster and avoid making things get worse.  Doing just one of these things may not be enough.    If you are not  getting better after two weeks or are noticing you are getting worse, contact our office for further advice; we may need to re-evaluate you & see what other things we can do to help.  Imatinib tablets What is this medicine? IMATINIB (i MAT in ib) is a medicine that targets proteins in cancer cells and stops the cancer cells from growing. It is used to treat certain leukemias, myelodysplastic syndromes, and other cancers. It is also used to treat specific digestive tract tumors called GISTs. This medicine may be used for other purposes; ask your health care provider or pharmacist if you have questions. What should I tell my health care provider before I take this medicine? They need to know if you have any of these conditions: -bleeding problems -infection (especially a virus infection such as chickenpox, cold sores, or herpes) -heart disease -heart failure -kidney disease -liver disease -lung disease -stomach problems -an unusual or allergic reaction to imatinib, other medicines, foods, dyes, or preservatives -pregnant or trying to get pregnant -breast-feeding How should I use this medicine? Take this medicine by mouth with a full glass of water. Take it with food to decrease the chance of it upsetting your stomach. Do not take with grapefruit juice. Follow the directions on the prescription label. Take your medicine at regular intervals. Do not take it more often than directed. Do not stop taking except on your doctor's advice. If you have difficulty swallowing the tablets, let your doctor, pharmacist, or health care professional know. They can help you with advice. Talk to your pediatrician regarding the use of this medicine in children. While this drug may be prescribed for children as young as 1 year for selected conditions, precautions do apply. Overdosage: If you think you have taken too much of this medicine contact a poison control center or emergency room at once. NOTE: This medicine  is only for you. Do not share this medicine with others. What if I miss a dose? If you miss a dose, take it as soon as you can. If it is almost time for your next dose, take only that dose and skip your missed dose. Do not take double or extra doses. What may interact with this medicine? -antiviral medicines for HIV or AIDS -bosentan -cisapride -clarithromycin -cyclosporine -dexamethasone -diltiazem -ergot alkaloids like dihydroergotamine, ergonovine, ergotamine, methylergonovine -erythromycin -grapefruit or grapefruit juice -medicines for cholesterol like atorvastatin lovastatin, simvastatin -medicines for depression, anxiety, or psychotic disturbances -medicines for fungal infections like ketoconazole and itraconazole -medicines for irregular heart beat like amiodarone, bepridil, dofetilide, encainide, flecainide, propafenone, quinidine -medicines for seizures like carbamazepine, phenobarbital, phenytoin -medicines for sleep -NSAIDS, medicines for pain and inflammation, like ibuprofen or naproxen -pimozide -rifabutin -rifampin -sildenafil -sirolimus -St. John's wort -tacrolimus -vaccines -verapamil -warfarin Talk to your doctor or health care professional before taking any of these medicines: -acetaminophen -aspirin -ibuprofen -ketoprofen -naproxen This list may not describe all possible interactions. Give your health care provider a list of all the medicines, herbs, non-prescription drugs, or dietary supplements you use. Also tell them if you smoke, drink alcohol, or use illegal drugs. Some items may interact with your medicine. What should I  watch for while using this medicine? Visit your doctor for checks on your progress. You will need to have regular blood tests while on this medicine. Report any new symptoms promptly. Call your doctor or health care professional for advice if you get a fever, chills or sore throat, or other symptoms of a cold or flu. Do not treat  yourself. This drug decreases your body's ability to fight infections. Try to avoid being around people who are sick. This medicine may increase your risk to bruise or bleed. Call your doctor or health care professional if you notice any unusual bleeding. You may get drowsy or dizzy. Do not drive, use machinery, or do anything that needs mental alertness until you know how this medicine affects you. Do not become pregnant while taking this medicine or for 14 days after stopping it. Women should inform their doctor if they wish to become pregnant or think they might be pregnant. There is a potential for serious side effects to an unborn child. Talk to your health care professional or pharmacist for more information. Do not breast-feed an infant while taking this medicine or for 1 month after stopping it. What side effects may I notice from receiving this medicine? Side effects that you should report to your doctor or health care professional as soon as possible: -low blood counts - this medicine may decrease the number of white blood cells, red blood cells and platelets. You may be at increased risk for infections and bleeding. -signs of infection - fever or chills, cough, sore throat, pain or difficulty passing urine -signs of decreased platelets or bleeding - bruising, pinpoint red spots on the skin, black, tarry stools, blood in the urine, nosebleeds -signs of decreased red blood cells - unusually weak or tired, fainting spells, lightheadedness -allergic reactions like skin rash, itching or hives, swelling of the face, lips, or tongue -breathing problems -changes in vision -dark urine -general ill feeling or flu-like symptoms -light-colored stools -loss of appetite -mouth sores -redness, blistering, peeling or loosening of the skin, including inside the mouth -right upper belly pain -swelling of the legs or ankles -trouble passing urine or change in the amount of urine -vomiting -yellowing  of the eyes or skin Side effects that usually do not require medical attention (report to your doctor or health care professional if they continue or are bothersome): -decreased appetite -diarrhea -difficulty sleeping -headache -heartburn -joint pain -muscle cramps or pain -nausea -upset stomach This list may not describe all possible side effects. Call your doctor for medical advice about side effects. You may report side effects to FDA at 1-800-FDA-1088. Where should I keep my medicine? Keep out of reach of children. Store tablets at room temperature between 15 and 30 degrees C (59 and 86 degrees F). Protect from moisture. Keep tightly closed. Throw away any unused medicine after the expiration date. NOTE: This sheet is a summary. It may not cover all possible information. If you have questions about this medicine, talk to your doctor, pharmacist, or health care provider.    2016, [employer]. (2014-12-29 15:36:35)

## 2015-06-15 NOTE — Progress Notes (Signed)
Patient independently empty and clean her ileostomy bag.

## 2015-06-15 NOTE — Progress Notes (Signed)
OT Cancellation Note  Patient Details Name: Brianna Walker MRN: [id number] DOB: 01/03/1946   Cancelled Treatment:    Reason Eval/Treat Not Completed: Patient at procedure or test/ unavailable  Jacobus Colvin 06/15/2015, 2:42 PM  Lesle Chris, OTR/L [id number] 06/15/2015

## 2015-06-15 NOTE — Progress Notes (Signed)
Dr Johney Maine made aware patient vomited roughly 100 cc green fluid.  Patient states she did the same last night and yesterday am.  Orders received to cancel discharge along with other orders.

## 2015-06-15 NOTE — Discharge Summary (Addendum)
Physician Discharge Summary  Patient ID: Brianna Walker MRN: [id number] DOB/AGE: [birth year] 68 y.o.  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 Active Problems:   Hyperlipidemia   GERD   INSOMNIA-SLEEP DISORDER-UNSPEC   PULMONARY EMBOLISM, HX OF   Iron deficiency anemia   Obesity   Metabolic syndrome   Chronic pain syndrome   Discharge Diagnoses:  Principal Problem:   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 Active Problems:   Hyperlipidemia   GERD   INSOMNIA-SLEEP DISORDER-UNSPEC   PULMONARY EMBOLISM, HX OF   Iron deficiency anemia   Obesity   Metabolic syndrome   Chronic pain syndrome   POST-OPERATIVE DIAGNOSIS:   GIST TUMOR OF RECTUM IN PELVIS  SURGERY:  06/04/2015  Procedure(s): XI ROBOTIC ASSISTED LYSIS OF ADHESIONS, LOWER ANTERIOR RESECTION TRANS ABDOMINAL AND TRANS ANAL, COLOANAL HANDSEWN ANASTAOSIS, DIVERTING LOPE ILIOSTOMY, RESECTION GIST TUMOR LAPAROSCOPIC LYSIS OF ADHESIONS  SURGEON:    Surgeon(s): Michael Boston, MD Leighton Ruff, MD  Consults: WOCN, PT, OT  Med Onc - [facility] Course:   The patient underwent the surgery above.  Postoperatively, the patient gradually mobilized and advanced to a solid diet.  Had days with an ileus requiring IVF & NGT, but evetually resolved.  Pain and other symptoms were treated aggressively.  Ostomy care / training done.   By the time of discharge, the patient was walking well the hallways, eating food, having flatus & BMs in ileostomy bag.   Getting used to the bag.  Pain was well-controlled on an oral medications.  CT scan w/o SBO, abscess, nor leak.  Based on meeting discharge criteria and continuing to recover, I felt it was safe for the patient to be discharged from the hospital to further recover with close followup. Home health for ostomy care & well as IVFs to avoid dehydration & readmission for the first 6 weeks.  Postoperative recommendations were discussed in detail.  They are written as well.   Significant Diagnostic Studies:  Results for orders placed or performed during the hospital encounter of 06/04/15 (from the past 72 hour(s))  Basic metabolic panel     Status: Abnormal   Collection Time: 06/13/15  5:45 AM  Result Value Ref Range   Sodium 139 135 - 145 mmol/L   Potassium 4.3 3.5 - 5.1 mmol/L   Chloride 106 101 - 111 mmol/L   CO2 22 22 - 32 mmol/L   Glucose, Bld 73 65 - 99 mg/dL   BUN 11 6 - 20 mg/dL   Creatinine, Ser 0.92 0.44 - 1.00 mg/dL   Calcium 8.4 (L) 8.9 - 10.3 mg/dL   GFR calc non Af Amer >60 >60 mL/min   GFR calc Af Amer >60 >60 mL/min    Comment: (NOTE) The eGFR has been calculated using the CKD EPI equation. This calculation has not been validated in all clinical situations. eGFR's persistently <60 mL/min signify possible Chronic Kidney Disease.    Anion gap 11 5 - 15  CBC     Status: Abnormal   Collection Time: 06/13/15  5:45 AM  Result Value Ref Range   WBC 10.5 4.0 -  10.5 K/uL   RBC 2.70 (L) 3.87 - 5.11 MIL/uL   Hemoglobin 7.7 (L) 12.0 - 15.0 g/dL   HCT 24.0 (L) 36.0 - 46.0 %   MCV 88.9 78.0 - 100.0 fL   MCH 28.5 26.0 - 34.0 pg   MCHC 32.1 30.0 - 36.0 g/dL   RDW 15.4 11.5 - 15.5 %   Platelets 321 150 - 400 K/uL  CBC     Status: Abnormal   Collection Time: 06/14/15  3:50 AM  Result Value Ref Range   WBC 10.6 (H) 4.0 - 10.5 K/uL   RBC 2.88 (L) 3.87 - 5.11 MIL/uL   Hemoglobin 8.3 (L) 12.0 - 15.0 g/dL   HCT 25.4 (L) 36.0 - 46.0 %   MCV 88.2 78.0 - 100.0 fL   MCH 28.8 26.0 - 34.0 pg    MCHC 32.7 30.0 - 36.0 g/dL   RDW 15.2 11.5 - 15.5 %   Platelets 371 150 - 400 K/uL  Basic metabolic panel     Status: Abnormal   Collection Time: 06/14/15  3:50 AM  Result Value Ref Range   Sodium 139 135 - 145 mmol/L   Potassium 3.8 3.5 - 5.1 mmol/L   Chloride 104 101 - 111 mmol/L   CO2 24 22 - 32 mmol/L   Glucose, Bld 88 65 - 99 mg/dL   BUN 8 6 - 20 mg/dL   Creatinine, Ser 0.86 0.44 - 1.00 mg/dL   Calcium 8.8 (L) 8.9 - 10.3 mg/dL   GFR calc non Af Amer >60 >60 mL/min   GFR calc Af Amer >60 >60 mL/min    Comment: (NOTE) The eGFR has been calculated using the CKD EPI equation. This calculation has not been validated in all clinical situations. eGFR's persistently <60 mL/min signify possible Chronic Kidney Disease.    Anion gap 11 5 - 15  CBC     Status: Abnormal   Collection Time: 06/15/15  4:20 AM  Result Value Ref Range   WBC 9.7 4.0 - 10.5 K/uL   RBC 2.91 (L) 3.87 - 5.11 MIL/uL   Hemoglobin 8.3 (L) 12.0 - 15.0 g/dL   HCT 25.5 (L) 36.0 - 46.0 %   MCV 87.6 78.0 - 100.0 fL   MCH 28.5 26.0 - 34.0 pg   MCHC 32.5 30.0 - 36.0 g/dL   RDW 15.0 11.5 - 15.5 %   Platelets 400 150 - 400 K/uL  Basic metabolic panel     Status: Abnormal   Collection Time: 06/15/15  4:20 AM  Result Value Ref Range   Sodium 139 135 - 145 mmol/L   Potassium 3.9 3.5 - 5.1 mmol/L   Chloride 102 101 - 111 mmol/L   CO2 25 22 - 32 mmol/L   Glucose, Bld 86 65 - 99 mg/dL   BUN 7 6 - 20 mg/dL   Creatinine, Ser 0.92 0.44 - 1.00 mg/dL   Calcium 8.8 (L) 8.9 - 10.3 mg/dL   GFR calc non Af Amer >60 >60 mL/min   GFR calc Af Amer >60 >60 mL/min    Comment: (NOTE) The eGFR has been calculated using the CKD EPI equation. This calculation has not been validated in all clinical situations. eGFR's persistently <60 mL/min signify possible Chronic Kidney Disease.    Anion gap 12 5 - 15    No results found.  Discharge Exam: Blood pressure 142/78, pulse 98, temperature 97.9 F (36.6 C), temperature source  Oral, resp. rate 16, height 5' 5" (1.651 m), weight 99.9   kg (220 lb 3.8 oz), SpO2 98 %.  General: Pt awake/alert/oriented x4 in no acute distress. Smiling/relaxed Eyes: PERRL, normal EOM. Sclera clear. No icterus Neuro: CN II-XII intact w/o focal sensory/motor deficits. Lymph: No head/neck/groin lymphadenopathy Psych: No delerium/psychosis/paranoia HENT: Normocephalic, Mucus membranes moist. No thrush Neck: Supple, No tracheal deviation Chest: No chest wall pain w good excursion CV: Pulses intact. Regular rhythm MS: Normal AROM mjr joints. No obvious deformity Abdomen: Soft but morbidly obese. Nondistended. Nontender. No fluctuance/cellultis. No guarding. No evidence of peritonitis. No incarcerated hernias. Duskiness 50% of loop ilsostomy/ min edema at ileostomy - stable. No necrosis Ext: SCDs BLE. No mjr edema. No cyanosis Skin: No petechiae / purpura  Discharged Condition: good   Past Medical History  Diagnosis Date  . ALLERGIC RHINITIS 09/12/2007  . BACK PAIN 06/19/2008  . Cramp of limb 08/11/2009  . Long term (current) use of anticoagulants 11/29/2010  . FREQUENCY, URINARY 10/07/2009  . GERD 12/05/2006  . HEMORRHOIDS 11/17/2006  . HIP PAIN, RIGHT, CHRONIC 08/11/2009  . HX, PERSONAL, TUBERCULOSIS 11/17/2006  . HYPERLIPIDEMIA 09/12/2007  . HYPERTENSION 11/17/2006  . INSOMNIA-SLEEP DISORDER-UNSPEC 06/19/2008  . LEG PAIN, RIGHT 06/19/2008  . MASS, SUPERFICIAL 09/12/2007  . MENOPAUSAL DISORDER 09/20/2009  . OVERACTIVE BLADDER 03/04/2010  . PULMONARY EMBOLISM, HX OF 11/17/2006    [facility] s/p Pneumonia  . RECTAL BLEEDING 10/07/2007  . SCIATICA, RIGHT 09/12/2007  . SKIN LESION 07/20/2009  . TMJ SYNDROME 11/17/2006  . Iron deficiency anemia 12/07/2011  . Diverticulosis   . Hyperlipidemia 09/12/2007    Qualifier: Diagnosis of  By: John MD, James W   . Arthritis   . Colon polyps   . DEEP VENOUS THROMBOPHLEBITIS, LEG, RIGHT 03/04/2010    right leg  . Pneumonia    . Rectal mass 06/2014  . Cancer (HCC)     dx. 4'16 -oral chemotherapy only.  . Shortness of breath dyspnea     With exertion since chemotherapy tx-oral meds  . Tuberculosis     pt. was tx.    Past Surgical History  Procedure Laterality Date  . Cholecystectomy open    . Abdominal hysterectomy    . Incisional hernia repair  2002      multiple ventral hernia repair/mesh  . Tumor removed off of right shoulder Right   . S/p right hip replaced min invasive hip surgury [facility] oct 2011 Right 2011  . Hemorroid surgery  removed at dr starks office    x 2  . Eus N/A 08/06/2014    Procedure: LOWER ENDOSCOPIC ULTRASOUND (EUS);  Surgeon: Daniel P Jacobs, MD;  Location: [facility];  Service: Endoscopy;  Laterality: N/A;  . Incisional hernia repair  01/13/2005    open w onlay Proceed mesh.  . Xi robotic assisted lower anterior resection N/A 06/04/2015    Procedure: XI ROBOTIC ASSISTED LYSIS OF ADHESIONS, LOWER ANTERIOR RESECTION TRANS ABDOMINAL AND TRANS ANAL, COLOANAL HANDSEWN ANASTAOSIS, DIVERTING LOPE ILIOSTOMY, RESECTION GIST TUMOR;  Surgeon: Steven Gross, MD;  Location: [facility];  Service: General;  Laterality: N/A;  . Laparoscopic lysis of adhesions  06/04/2015    Procedure: LAPAROSCOPIC LYSIS OF ADHESIONS;  Surgeon: Steven Gross, MD;  Location: [facility];  Service: General;;    Social History   Social History  . Marital Status: Married    Spouse Name: N/A  . Number of Children: 2  . Years of Education: N/A   Occupational History  . retired    Social History Main Topics  .   Smoking status: Former Smoker -- 0.00 packs/day    Types: Cigarettes    Quit date: 06/09/1977  . Smokeless tobacco: Never Used  . Alcohol Use: 0.0 oz/week    0 Standard drinks or equivalent per week     Comment: rare  . Drug Use: No     Comment: past marijuana  . Sexual Activity: Not on file   Other Topics Concern  . Not on file   Social History Narrative   Husband civil engineer [place]   Has #2 children  (one son at home with her)   Prior employment-various customer service positions    Family History  Problem Relation Age of Onset  . Colon cancer Father   . Heart disease Mother   . Heart disease Sister   . Diabetes Sister     Current Facility-Administered Medications  Medication Dose Route Frequency Provider Last Rate Last Dose  . 0.9 %  sodium chloride infusion  250 mL Intravenous PRN Steven Gross, MD      . 0.9 % NaCl with KCl 40 mEq / L  infusion   Intravenous Continuous Steven Gross, MD 50 mL/hr at 06/14/15 1423 50 mL/hr at 06/14/15 1423  . alum & mag hydroxide-simeth (MAALOX/MYLANTA) 200-200-20 MG/5ML suspension 30 mL  30 mL Oral Q6H PRN Steven Gross, MD   30 mL at 06/08/15 2016  . aspirin EC tablet 325 mg  325 mg Oral Daily Steven Gross, MD   325 mg at 06/14/15 0853  . benazepril (LOTENSIN) tablet 20 mg  20 mg Oral QPM Steven Gross, MD   20 mg at 06/14/15 1845  . diphenhydrAMINE (BENADRYL) 12.5 MG/5ML elixir 12.5 mg  12.5 mg Oral Q6H PRN Steven Gross, MD       Or  . diphenhydrAMINE (BENADRYL) injection 12.5 mg  12.5 mg Intravenous Q6H PRN Steven Gross, MD      . enoxaparin (LOVENOX) injection 40 mg  40 mg Subcutaneous Q24H Steven Gross, MD   40 mg at 06/15/15 0736  . estradiol (CLIMARA - Dosed in mg/24 hr) patch 0.05 mg  0.05 mg Transdermal Weekly Steven Gross, MD   0.05 mg at 06/09/15 1030  . feeding supplement (GLUCERNA SHAKE) (GLUCERNA SHAKE) liquid 237 mL  237 mL Oral BID BM Steven Gross, MD   237 mL at 06/12/15 1100  . ferrous sulfate tablet 325 mg  325 mg Oral BID WC Steven Gross, MD   325 mg at 06/15/15 0736  . HYDROmorphone (DILAUDID) injection 0.5-2 mg  0.5-2 mg Intravenous Q2H PRN Steven Gross, MD   1 mg at 06/14/15 1013  . lip balm (CARMEX) ointment 1 application  1 application Topical BID Steven Gross, MD   1 application at 06/14/15 2117  . magic mouthwash  15 mL Oral QID PRN Steven Gross, MD      . menthol-cetylpyridinium (CEPACOL) lozenge 3 mg  1 lozenge Oral PRN  Steven Gross, MD      . metoCLOPramide (REGLAN) injection 5-10 mg  5-10 mg Intravenous Q6H PRN Steven Gross, MD   10 mg at 06/14/15 1504  . metoprolol (LOPRESSOR) injection 5 mg  5 mg Intravenous Q6H PRN Steven Gross, MD      . nystatin (MYCOSTATIN/NYSTOP) topical powder   Topical BID Steven Gross, MD      . ondansetron (ZOFRAN) injection 4 mg  4 mg Intravenous Q6H PRN Steven Gross, MD   4 mg at 06/14/15 1845  . ondansetron (ZOFRAN-ODT) disintegrating tablet 4-8 mg  4-8 mg Oral Q6H   PRN Michael Boston, MD      . oxyCODONE (Oxy IR/ROXICODONE) immediate release tablet 5-10 mg  5-10 mg Oral Q4H PRN Michael Boston, MD      . pantoprazole (PROTONIX) EC tablet 40 mg  40 mg Oral QHS Michael Boston, MD   40 mg at 06/14/15 2117  . phenol (CHLORASEPTIC) mouth spray 2 spray  2 spray Mouth/Throat PRN Michael Boston, MD   2 spray at 06/11/15 1339  . prochlorperazine (COMPAZINE) tablet 5 mg  5 mg Oral Q6H PRN Michael Boston, MD   5 mg at 06/09/15 0734  . promethazine (PHENERGAN) injection 6.25-12.5 mg  6.25-12.5 mg Intravenous Q4H PRN Michael Boston, MD   12.5 mg at 06/14/15 2009  . sodium chloride flush (NS) 0.9 % injection 10-40 mL  10-40 mL Intracatheter PRN Michael Boston, MD   10 mL at 06/15/15 0420  . sodium chloride flush (NS) 0.9 % injection 3 mL  3 mL Intravenous Q12H Michael Boston, MD   3 mL at 06/14/15 0902  . sodium chloride flush (NS) 0.9 % injection 3 mL  3 mL Intravenous PRN Michael Boston, MD         No Known Allergies  Disposition: 81-Discharged to home/self-care with a planned acute care hospital inpt readmission  Discharge Instructions    Call MD for:  extreme fatigue    Complete by:  As directed      Call MD for:  hives    Complete by:  As directed      Call MD for:  persistant nausea and vomiting    Complete by:  As directed      Call MD for:  redness, tenderness, or signs of infection (pain, swelling, redness, odor or green/yellow discharge around incision site)    Complete by:  As directed       Call MD for:  severe uncontrolled pain    Complete by:  As directed      Call MD for:    Complete by:  As directed   Temperature > 101.47F     Diet - low sodium heart healthy    Complete by:  As directed      Discharge instructions    Complete by:  As directed   Please see discharge instruction sheets.  Also refer to handout given an office.  Please call our office if you have any questions or concerns [phone]     Discharge wound care:    Complete by:  As directed   If you have closed incisions, shower and bathe over these incisions with soap and water every day.  Remove all surgical dressings on postoperative day #3.  You do not need to replace dressings over the closed incisions unless you feel more comfortable with a Band-Aid covering it.   If you have an open wound that requires packing, please see wound care instructions.  In general, remove all dressings, wash wound with soap and water and then replace with saline moistened gauze.  Do the dressing change at least every day.  Please call our office [phone] if you have further questions.     Driving Restrictions    Complete by:  As directed   No driving until off narcotics and can safely swerve away without pain during an emergency     Increase activity slowly    Complete by:  As directed   Walk an hour a day.  Use 20-30 minute walks.  When you can walk 30 minutes without  difficulty, it is fine to restart low impact/moderate activities such as biking, jogging, swimming, sexual activity, etc.  Eventually you can increase to unrestricted activity when not feeling pain.  If you feel pain: STOP!Marland Kitchen   Let pain protect you from overdoing it.  Use ice/heat & over-the-counter pain medications to help minimize soreness.  If that is not enough, then use your narcotic pain prescription as needed to remain active.  It is better to take extra pain medications and be more active than to stay bedridden to avoid all pain medications.     Lifting  restrictions    Complete by:  As directed   Avoid heavy lifting initially.  Do not push through pain.  You have no specific weight limit - if it hurts to do, DON'T DO IT.   If you feel no pain, you are not injuring anything.  Pain will protect you from injury.  Coughing and sneezing are far more stressful to your incision than any lifting.  Avoid resuming heavy lifting / intense activity until off all narcotic pain medications.  When ready to exercise more, give yourself 2 weeks to gradually get back to full intense exercise/activity.     May shower / Bathe    Complete by:  As directed      May walk up steps    Complete by:  As directed      Sexual Activity Restrictions    Complete by:  As directed   Sexual activity as tolerated.  Do not push through pain.  Pain will protect you from injury.     Walk with assistance    Complete by:  As directed   Walk over an hour a day.  May use a walker/cane/companion to help with balance and stamina.            Medication List    TAKE these medications        acetaminophen 500 MG tablet  Commonly known as:  TYLENOL  Take 1,000 mg by mouth every 6 (six) hours as needed for moderate pain.     apixaban 5 MG Tabs tablet  Commonly known as:  ELIQUIS  Take 1 tablet (5 mg total) by mouth 2 (two) times daily.     aspirin 81 MG tablet  Take 81 mg by mouth at bedtime.     benazepril 40 MG tablet  Commonly known as:  LOTENSIN  Take 1 tablet (40 mg total) by mouth every evening.     DELSYM COUGH/COLD DAYTIME PO  Take 30 mLs by mouth 2 (two) times daily as needed (cough.).     estradiol 0.025 MG/24HR  Commonly known as:  VIVELLE-DOT  APPLY 1 PATCH ONTO THE SKIN TWO TIMES WEEKLY     ferrous sulfate 325 (65 FE) MG tablet  Take 1 tablet (325 mg total) by mouth daily.     GLEEVEC 400 MG tablet  Generic drug:  imatinib  TAKE 1 TABLET BY MOUTH ONCE DAILY WITH MEALS AND LARGE GLASS OF WATER     omeprazole 20 MG capsule  Commonly known as:  PRILOSEC   Take 1 capsule (20 mg total) by mouth every evening.     pravastatin 20 MG tablet  Commonly known as:  PRAVACHOL  Take 1 tablet (20 mg total) by mouth daily.     prochlorperazine 5 MG tablet  Commonly known as:  COMPAZINE  Take 1 tablet (5 mg total) by mouth every 6 (six) hours as needed for nausea or vomiting.  traMADol 50 MG tablet  Commonly known as:  ULTRAM  Take 1 tablet (50 mg total) by mouth at bedtime as needed.           Follow-up Information    Follow up with Blessings Inglett C., MD. Schedule an appointment as soon as possible for a visit in 2 weeks.   Specialty:  General Surgery   Why:  To follow up after your operation, To follow up after your hospital stay   Contact information:   [place] [address] [phone]       Follow up with [facility].   Why:  nurse for ostomy care   Contact information:   [address] [place] [address] [phone]        Signed: Morton Peters, M.D., F.A.C.S. Gastrointestinal and Minimally Invasive Surgery [facility] [address], [place], [place] [address] [phone] Main / Paging   06/15/2015, 8:23 AM

## 2015-06-16 LAB — BASIC METABOLIC PANEL
ANION GAP: 10 (ref 5–15)
BUN: 9 mg/dL (ref 6–20)
CALCIUM: 9.1 mg/dL (ref 8.9–10.3)
CO2: 26 mmol/L (ref 22–32)
Chloride: 102 mmol/L (ref 101–111)
Creatinine, Ser: 0.91 mg/dL (ref 0.44–1.00)
GFR calc Af Amer: >60 mL/min (ref >60)
GLUCOSE: 89 mg/dL (ref 65–99)
Potassium: 3.7 mmol/L (ref 3.5–5.1)
Sodium: 138 mmol/L (ref 135–145)

## 2015-06-16 LAB — CBC
HCT: 26.5 % — ABNORMAL LOW (ref 36.0–46.0)
Hemoglobin: 8.6 g/dL — ABNORMAL LOW (ref 12.0–15.0)
MCH: 28.3 pg (ref 26.0–34.0)
MCHC: 32.5 g/dL (ref 30.0–36.0)
MCV: 87.2 fL (ref 78.0–100.0)
PLATELETS: 454 10*3/uL — AB (ref 150–400)
RBC: 3.04 MIL/uL — ABNORMAL LOW (ref 3.87–5.11)
RDW: 15.1 % (ref 11.5–15.5)
WBC: 10.9 10*3/uL — AB (ref 4.0–10.5)

## 2015-06-16 MED ORDER — METOCLOPRAMIDE HCL 10 MG PO TABS
10.0000 mg | ORAL_TABLET | Freq: Three times a day (TID) | ORAL | Status: DC
  Administered 2015-06-16 – 2015-06-17 (×5): 10 mg via ORAL
  Filled 2015-06-16 (×9): qty 1

## 2015-06-16 NOTE — Progress Notes (Signed)
Occupational Therapy Treatment Patient Details Name: Brianna Walker MRN: [id number] DOB: [birth year] Today's Date: 06/16/2015    History of present illness GIST (gastrointestinal stroma tumor) of distal rectum s/p loop ileostomy 06/04/2015.    OT comments  Pt making good progress.  Upgraded goals today  Follow Up Recommendations  Supervision/Assistance - 24 hour    Equipment Recommendations  None recommended by OT    Recommendations for Other Services      Precautions / Restrictions Precautions Precautions: Fall Restrictions Weight Bearing Restrictions: No       Mobility Bed Mobility         Supine to sit: Modified independent (Device/Increase time);HOB elevated     General bed mobility comments: cued for rolling from flat bed (raised 20 degrees); pt did this 1/2 way  Transfers       Sit to Stand: Supervision         General transfer comment: cues for UE placement    Balance                                   ADL Overall ADL's : Needs assistance/impaired                         Toilet Transfer: Supervision/safety;Ambulation;RW;Comfort height toilet   Toileting- Clothing Manipulation and Hygiene: Supervision/safety;Sit to/from stand         General ADL Comments: cued to push up from comfort height commode and use one hand on walker for transfer; this is her set up at home and should be fine for her.  Pt wanted to wash up while she was in the bathroom--min A for feet.  NT came in and took over to assist with emptying ostomy bag.        Vision                     Perception     Praxis      Cognition   Behavior During Therapy: WFL for tasks assessed/performed Overall Cognitive Status: Within Functional Limits for tasks assessed                       Extremity/Trunk Assessment               Exercises     Shoulder Instructions       General Comments      Pertinent Vitals/ Pain       Pain  Assessment: No/denies pain (had nausea at [facility])  Home Living                                          Prior Functioning/Environment              Frequency       Progress Toward Goals  OT Goals(current goals can now be found in the care plan section)  Progress towards OT goals: Progressing toward goals (upgraded)  Acute Rehab OT Goals Time For Goal Achievement: 06/23/15 ADL Goals Additional ADL Goal #1: tub transfer with seat vs. bench at min guard level  Plan      Co-evaluation                 End of Session     Activity Tolerance Patient  tolerated treatment well   Patient Left in chair;with call bell/phone within reach;with nursing/sitter in room   Nurse Communication          Time: [id number] OT Time Calculation (min): 27 min  Charges: OT General Charges $OT Visit: 1 Procedure OT Treatments $Self Care/Home Management : 23-37 mins  Augustus Zurawski 06/16/2015, 2:03 PM  Lesle Chris, OTR/L [phone] 06/16/2015

## 2015-06-16 NOTE — Progress Notes (Signed)
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 Resection of cystic mass near small bowel with serosal repair.  Surgeon(s): Michael Boston, MD Leighton Ruff, MD - Assist   Diagnosis 1. Soft tissue, biopsy, abdominal wall cyst ? fistula BENIGN CYST AND REACTIVE CHANGES NEGATIVE FOR MALIGNANCY 2. Soft tissue mass, simple excision, Pelvic GIST GASTROINTESTINAL STROMAL TUMORS OF THE RECTUM SHOWING TREATMENT EFFECT (9.5 CM) MARGIN OF RESECTION IS POSITIVE 3. Colon, segmental resection for tumor, rectum DIVERTICULA NO RESIDUAL GASTROINTESTINAL STROMAL TUMOR IDENTIFIED MARGINS OF RESECTION ARE NEGATIVE FIFTEEN BENIGN LYMPH NODES (0/15) Microscopic Comment 2. GASTROINTESTINAL STROMAL TUMOR (GIST): Procedure: segmental resection Tumor Site: rectum Specify: pelvis rectum Tumor Size: 9.5 cm Tumor Focality Unifocal: focal GIST Subtype (spindle, epithelioid, mixed, etc.): spindle Mitotic Rate: 1 /50 HIGH POWER FIELD Necrosis: negative Histologic Grade 1 G1: Low grade; mitotic rate 5/50 HIGH POWER FIELD. G2: High grade; mitotic rate >5/50 HIGH POWER FIELD. Risk Assessment: _ Margins: positive Ancillary testing: Per request 1 of  3 FINAL for YTZEL, GUBLER [phone]) Microscopic Comment(continued) Lymph nodes: number examined: 15; number positive: 0 Pathologic Staging: pT3 pN0 Comments: The tumor shows treatment effect with hypocellular, atrophic spindle cells and hyalinized, fibrotic stroma. less than 1% of the tumor without treatment effect. [name] MD Pathologist, Electronic Signature (Case signed 06/10/2015) Specimen Yerlin Gasparyan and Clinical Information Specimen(s) Obtained: 1. Soft tissue, biopsy, abdominal wall cyst ? fistula 2. Soft tissue mass, simple excision, Pelvic GIST 3. Colon, segmental resection for tumor, rectum Specimen Clinical Information 1. GIST tumor of rectum in pelvis (kp) Gissele Narducci 1. In formalin is a a 3.1 x 1.4 x 0.3 cm irregular portion of fatty tissue, which on sectioning contains a 0.5 cm collapsed smooth cystic space. Sectioned and entirely submitted in one block. 2. Received in formalin is a 176 gram, 9.5 x 6.8 x 5.8 cm rubbery to firm mass, which has an attached suture over a 2.1 x 1.5 cm roughened area, clinically identifying anterior distal. This area is inked orange. There is also a 5.0 x 4.2 cm disrupted area, which is clinically identified as disrupted margin. This area is inked black. Also along one aspect is a 3.4 x 3.3 cm area of tan to hyperemic possible smooth mucosa. The cut surfaces of the mass are tan to tan yellow, with vaguely whorled cut surfaces, and scattered myxoid areas. Representative sections are submitted as follows: A, B = sections from clinically disrupted margin C = sections from area identified by suture as anterior distal D, E = central sections Total: 5 blocks submitted 3. Received in formalin is a 27 cm in length segment of colon, clinically recto-sigmoid, which includes at least probable middle third of rectum. Surrounding mesorectum is incomplete and focally disrupted. There is an attached suture over the anterior perirectal surface. The mucosa is  tan pink, smooth, soft with normal intestinal folds. There are no mucosal lesions identified. Within the sigmoid portion are several unperforated diverticula. Found within the surrounding fat are 15 possible lymph nodes ranging from 0.1 to 0.6 cm. Block Summary: A = proximal margin B, C = distal margin D, E = sections taken at attached suture over anterior mesorectum F = diverticulum G = four nodes, whole H = four nodes, whole I = four nodes, whole J = three nodes, whole Total: 10 blocks submitted (SW:ds 06/07/15) 2 of 3 FINAL for VIVYAN, BIGGERS ([id number]) Report signed out from the following location(s) Technical component and interpretation was performed at [facility], [place], [place] [address]. CLIA #: [id number], 3 of 3 Assessment  ILEUS ?maybe resolved   Plan:  -STRICT I&O  -full liquid diet - advance  -stop IVF  -HH IVF qMWF to avoid dehydration since orthostatic & fait PO intake to avoid ARF/readmission  -lytes OK - follow  -OSTOMY CARE / TRAINING  -VTE prophylaxis- SCDs, etc  -GERD - PPI  -hyperlipidemia - Tx  -HTN - 1/2 dose ACE Inh with chr diarrhea (diuretic effect) w ileostomy  -h/o PE - inc ASA.  Restart Eliquis soon if tol solids & ileus resolved - prob tomorrow  -mobilize as tolerated to help recovery  -Pathology - ?+margin most likely fracture of GIST exposed.  Most likely will get gleevec post-adjuvant for 3 years total per Dr Benay Spice.  D/w him again today  -CT scan if not better by tomorrow or worse  D/C patient from hospital when patient meets criteria (anticipate in 1-2 day(s)):  Tolerating oral intake well Ambulating well Adequate pain control without IV medications Urinating  Having flatus Ostomy training Disposition planning in place - esp. HH arranged for ileostomy training/care & IVF boluses at home   Adin [name], M.D., F.A.C.S. [facility] [address] [place], [place] [address] [phone] Main / Paging   06/16/2015  Subjective:  "I feel MUCH better" Walking well in hallways Smiling, relaxed No anorectal pain Minimal RUQ pain at ileostomy    Objective:  Vital signs:  Filed Vitals:   06/14/15 2126 06/15/15 0540 06/15/15 2114 06/16/15 0458  BP: 150/78 142/78 130/72 136/70  Pulse: 103 98 103 92  Temp: 98.6 F (37 C) 97.9 F (36.6 C) 98.9 F (37.2 C) 99.1 F (37.3 C)  TempSrc: Oral Oral Oral Oral  Resp: '15 16 16 16  ' Height:      Weight:  99.9 kg (220 lb 3.8 oz)    SpO2:  98% 97% 100%    Last BM Date: 06/15/15  Intake/Output   Yesterday:  02/14 0701 - 02/15 0700 In: 4562.5 [P.O.:420; I.V.:1207.2; IV Piggyback:1000] Out: 6389 [Urine:1500; Emesis/NG output:1; Stool:350] This shift:     Bowel function:  Flatus: YES  BM: thick effluent in bag.  Occ small BM out anus  Drain: serosanguinous  Physical Exam:  General: Pt awake/alert/oriented x4 in no acute distress.  Smiling/relaxed Eyes: PERRL, normal EOM.  Sclera clear.  No icterus Neuro: CN II-XII intact w/o focal sensory/motor deficits. Lymph: No head/neck/groin lymphadenopathy Psych:  No delerium/psychosis/paranoia HENT: Normocephalic, Mucus membranes moist.  No thrush Neck: Supple, No tracheal deviation Chest: No chest wall pain w good excursion CV:  Pulses intact.  Regular rhythm MS: Normal AROM mjr joints.  No obvious deformity Abdomen: Soft but morbidly obese.  Nondistended.  Nontender.  No fluctuance/cellultis.  No guarding.  No evidence of peritonitis.  No incarcerated hernias.  Duskiness 50% of loop ilsostomy/ min edema at ileostomy - stable.  No necrosis Ext:  SCDs BLE.  No mjr edema.  No cyanosis Skin: No petechiae / purpura  Results:   Labs: Results for orders placed or performed during the hospital encounter of 06/04/15 (from the past 48 hour(s))  CBC     Status: Abnormal    Collection Time: 06/15/15  4:20 AM  Result Value Ref Range   WBC 9.7 4.0 - 10.5 K/uL   RBC 2.91 (L) 3.87 - 5.11 MIL/uL   Hemoglobin 8.3 (L) 12.0 - 15.0 g/dL   HCT 25.5 (L) 36.0 - 46.0 %   MCV 87.6 78.0 - 100.0 fL   MCH 28.5 26.0 - 34.0 pg   MCHC 32.5 30.0 - 36.0 g/dL   RDW 15.0 11.5 - 15.5 %   Platelets 400 150 - 400 K/uL  Basic metabolic panel     Status: Abnormal   Collection Time: 06/15/15  4:20 AM  Result Value Ref Range   Sodium 139 135 - 145 mmol/L   Potassium 3.9 3.5 - 5.1 mmol/L   Chloride 102 101 - 111 mmol/L   CO2 25 22 - 32 mmol/L   Glucose, Bld 86 65 - 99 mg/dL   BUN 7 6 - 20 mg/dL   Creatinine, Ser 0.92 0.44 - 1.00 mg/dL   Calcium 8.8 (L) 8.9 - 10.3 mg/dL   GFR calc non Af Amer >60 >60 mL/min   GFR calc Af Amer >60 >60 mL/min    Comment: (NOTE) The eGFR has been calculated using the CKD EPI equation. This calculation has not been validated in all clinical situations. eGFR's persistently <60 mL/min signify possible Chronic Kidney Disease.    Anion gap 12 5 - 15  CBC     Status: Abnormal   Collection Time: 06/16/15  3:45 AM  Result Value Ref Range   WBC 10.9 (H) 4.0 - 10.5 K/uL   RBC 3.04 (L) 3.87 - 5.11 MIL/uL   Hemoglobin 8.6 (L) 12.0 - 15.0 g/dL   HCT 26.5 (L) 36.0 - 46.0 %   MCV 87.2 78.0 - 100.0 fL   MCH 28.3 26.0 - 34.0 pg   MCHC 32.5 30.0 - 36.0 g/dL   RDW 15.1 11.5 - 15.5 %   Platelets 454 (H) 150 - 400 K/uL  Basic metabolic panel     Status: None   Collection Time: 06/16/15  3:45 AM  Result Value Ref Range   Sodium 138 135 - 145 mmol/L   Potassium 3.7 3.5 - 5.1 mmol/L   Chloride 102 101 - 111 mmol/L   CO2 26 22 - 32 mmol/L   Glucose, Bld 89 65 - 99 mg/dL   BUN 9 6 - 20 mg/dL   Creatinine, Ser 0.91 0.44 - 1.00 mg/dL   Calcium 9.1 8.9 - 10.3 mg/dL   GFR calc non Af Amer >60 >60 mL/min   GFR calc Af Amer >60 >60 mL/min    Comment: (NOTE) The eGFR has been calculated using the CKD EPI equation. This calculation has not been validated in all  clinical situations. eGFR's persistently <60 mL/min signify possible Chronic Kidney Disease.    Anion gap 10 5 - 15      Imaging / Studies: Dg Abd Acute W/chest  06/15/2015  CLINICAL DATA:  70 year old with resection of a large a no rectal GI stromal tumor on 06/04/2015 and resection of a cystic  mass involving the anterior abdominal wall attached to a loop of mid ileum with serosal repair. Acute onset of nausea and vomiting which began last night. EXAM: DG ABDOMEN ACUTE W/ 1V CHEST COMPARISON:  MRI pelvis 12/14/2014. CT abdomen and pelvis 07/22/2014. Two-view chest x-ray 12/05/2011. FINDINGS: Multiple moderate to markedly distended loops of small bowel in the upper abdomen, demonstrating air-fluid levels on the erect image. Remainder of the small bowel normal in caliber. No evidence of free intraperitoneal air on the erect image. Subcutaneous emphysema throughout the anterior abdominal wall. Surgical mesh material in the right mid abdominal wall. Surgical clips in the right upper quadrant from prior cholecystectomy. Degenerative changes involving the lower lumbar spine. Prior right hip arthroplasty with anatomic alignment in the AP projection. Moderate degenerative changes involving the left hip. Right arm PICC tip projects over the lower SVC. Cardiac silhouette upper normal in size to mildly enlarged, unchanged. Lungs clear. Bronchovascular markings normal. Pulmonary vascularity normal. No visible pleural effusions. No pneumothorax. IMPRESSION: 1. Partial small bowel obstruction versus severe focal ileus in the upper abdomen. Partial obstruction is favored. 2. No free intraperitoneal air. 3. Extensive subcutaneous emphysema. 4. Borderline heart size.  No acute cardiopulmonary disease. 5. Right arm PICC tip projects over the lower SVC. Electronically Signed   By: Evangeline Dakin M.D.   On: 06/15/2015 14:57    Medications / Allergies: per chart  Antibiotics: Anti-infectives    Start     Dose/Rate  Route Frequency Ordered Stop   06/05/15 0200  cefoTEtan (CEFOTAN) 2 g in dextrose 5 % 50 mL IVPB     2 g 100 mL/hr over 30 Minutes Intravenous Every 12 hours 06/04/15 1702 06/05/15 0222   06/04/15 1044  clindamycin (CLEOCIN) 900 mg, gentamicin (GARAMYCIN) 240 mg in sodium chloride 0.9 % 1,000 mL for intraperitoneal lavage  Status:  Discontinued       As needed 06/04/15 1044 06/04/15 1545   06/04/15 0613  cefoTEtan (CEFOTAN) 2 g in dextrose 5 % 50 mL IVPB     2 g 100 mL/hr over 30 Minutes Intravenous On call to O.R. 06/04/15 2707 06/04/15 1345   06/03/15 1400  clindamycin (CLEOCIN) 900 mg, gentamicin (GARAMYCIN) 240 mg in sodium chloride 0.9 % 1,000 mL for intraperitoneal lavage  Status:  Discontinued    Comments:  Pharmacy may adjust dosing strength, schedule, rate of infusion, etc as needed to optimize therapy    Intraperitoneal To Surgery 06/03/15 1351 06/04/15 1703        Note: Portions of this report may have been transcribed using voice recognition software. Every effort was made to ensure accuracy; however, inadvertent computerized transcription errors may be present.   Any transcriptional errors that result from this process are unintentional.     Adin [name], M.D., F.A.C.S. Gastrointestinal and Minimally Invasive [facility] [address], [place], [place] [address] [phone] Main / Paging   06/16/2015  CARE TEAM:  PCP: Cathlean Cower, MD  Outpatient Care Team: Patient Care Team: Biagio Borg, MD as PCP - General Michael Boston, MD as Consulting Physician (General Surgery) Ladell Pier, MD as Consulting Physician (Oncology) Milus Banister, MD as Consulting Physician (Gastroenterology) Billy Fischer, MD as Consulting Physician (Family Medicine)  Inpatient Treatment Team: Treatment Team: Attending Provider: Michael Boston, MD; Technician: Tenna Child, [place]; Consulting Physician: Ladell Pier, MD; Registered  Nurse: Guadalupe Dawn, RN; Registered Nurse: Celedonio Savage, RN; Registered Nurse: Mady Haagensen  R[place], RN

## 2015-06-16 NOTE — Care Management Important Message (Signed)
Important Message  Patient Details  Name: BRIELEE GHOSH MRN: [id number] Date of Birth: [birth year]   Medicare Important Message Given:  Yes    Camillo Flaming 06/16/2015, 11:42 AMImportant Message  Patient Details  Name: SHIRENE FEBO MRN: [id number] Date of Birth: 02-Feb-1946   Medicare Important Message Given:  Yes    Camillo Flaming 06/16/2015, 11:42 AM

## 2015-06-17 ENCOUNTER — Inpatient Hospital Stay (HOSPITAL_COMMUNITY): Payer: Commercial Managed Care - HMO

## 2015-06-17 DIAGNOSIS — R110 Nausea: Secondary | ICD-10-CM

## 2015-06-17 LAB — CBC
HEMATOCRIT: 28.2 % — AB (ref 36.0–46.0)
HEMOGLOBIN: 9.3 g/dL — AB (ref 12.0–15.0)
MCH: 28.6 pg (ref 26.0–34.0)
MCHC: 33 g/dL (ref 30.0–36.0)
MCV: 86.8 fL (ref 78.0–100.0)
PLATELETS: 507 10*3/uL — AB (ref 150–400)
RBC: 3.25 MIL/uL — AB (ref 3.87–5.11)
RDW: 15.3 % (ref 11.5–15.5)
WBC: 11.6 10*3/uL — ABNORMAL HIGH (ref 4.0–10.5)

## 2015-06-17 LAB — BASIC METABOLIC PANEL
Anion gap: 12 (ref 5–15)
BUN: 12 mg/dL (ref 6–20)
CHLORIDE: 98 mmol/L — AB (ref 101–111)
CO2: 27 mmol/L (ref 22–32)
Calcium: 8.9 mg/dL (ref 8.9–10.3)
Creatinine, Ser: 0.94 mg/dL (ref 0.44–1.00)
GFR calc Af Amer: >60 mL/min (ref >60)
GFR calc non Af Amer: >60 mL/min (ref >60)
GLUCOSE: 108 mg/dL — AB (ref 65–99)
POTASSIUM: 3.3 mmol/L — AB (ref 3.5–5.1)
Sodium: 137 mmol/L (ref 135–145)

## 2015-06-17 MED ORDER — IOHEXOL 300 MG/ML  SOLN
25.0000 mL | INTRAMUSCULAR | Status: AC
  Administered 2015-06-17: 25 mL via ORAL

## 2015-06-17 MED ORDER — FENTANYL CITRATE (PF) 100 MCG/2ML IJ SOLN: 25.0000 ug | INTRAMUSCULAR | Status: DC | PRN

## 2015-06-17 MED ORDER — POTASSIUM CHLORIDE CRYS ER 20 MEQ PO TBCR
40.0000 meq | EXTENDED_RELEASE_TABLET | Freq: Every day | ORAL | Status: DC
Start: 2015-06-17 — End: 2015-06-18
  Administered 2015-06-17 – 2015-06-18 (×2): 40 meq via ORAL
  Filled 2015-06-17 (×2): qty 2

## 2015-06-17 MED ORDER — SODIUM CHLORIDE 0.9 % IV BOLUS (SEPSIS)
1000.0000 mL | Freq: Once | INTRAVENOUS | Status: AC
  Administered 2015-06-17: 1000 mL via INTRAVENOUS

## 2015-06-17 MED ORDER — TRAMADOL HCL 50 MG PO TABS: 50.0000 mg | ORAL_TABLET | Freq: Four times a day (QID) | ORAL | Status: DC | PRN

## 2015-06-17 MED ORDER — IOHEXOL 300 MG/ML  SOLN
100.0000 mL | Freq: Once | INTRAMUSCULAR | Status: AC | PRN
  Administered 2015-06-17: 100 mL via INTRAVENOUS

## 2015-06-17 MED ORDER — PROCHLORPERAZINE MALEATE 10 MG PO TABS
5.0000 mg | ORAL_TABLET | Freq: Four times a day (QID) | ORAL | Status: DC | PRN
Start: 2015-06-17 — End: 2015-06-18

## 2015-06-17 NOTE — Progress Notes (Signed)
PT Cancellation Note  Patient Details Name: Brianna Walker MRN: [id number] DOB: Mar 31, 1946   Cancelled Treatment:    Reason Eval/Treat Not Completed: Other (comment) (Patient reports that she has already walked this AM. )Check back another time.   Claretha Cooper 06/17/2015, 9:32 AM Tresa Endo PT [phone]

## 2015-06-17 NOTE — Progress Notes (Signed)
Received return call from Dr. Barry Dienes.  Reviewed CT results.  No new orders given at this time.  Will cont to monitor patient status.  Petra Kuba, RN

## 2015-06-17 NOTE — Consult Note (Signed)
WOC ostomy follow up Stoma type/location: RLQ loop ileostomy Stomal assessment/size: 1 and 3/8 inches slight oval with os at 6 o'clock Peristomal assessment: intact Treatment options for stomal/peristomal skin: skin barrier ring Output brown liquid effluent Ostomy pouching: 2pc. 2 and 1/4 inches with skin barrier ring Education provided: Patient reassured that set back with nausea will resolve eventually and that she will be home soon.  Independent in emptying and has observed pouch change several times.  Is tearful today that she will be having a CT scan. Enrolled patient in [facility] Discharge program: Yes [facility] nursing team will follow, and will remain available to this patient, the nursing and medical teams.   Thanks, Maudie Flakes, MSN, RN, [place], Arther Abbott  Pager# [phone]

## 2015-06-17 NOTE — Progress Notes (Signed)
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 Resection of cystic mass near small bowel with serosal repair.  Surgeon(s): Michael Boston, MD Leighton Ruff, MD - Assist   Diagnosis 1. Soft tissue, biopsy, abdominal wall cyst ? fistula BENIGN CYST AND REACTIVE CHANGES NEGATIVE FOR MALIGNANCY 2. Soft tissue mass, simple excision, Pelvic GIST GASTROINTESTINAL STROMAL TUMORS OF THE RECTUM SHOWING TREATMENT EFFECT (9.5 CM) MARGIN OF RESECTION IS POSITIVE 3. Colon, segmental resection for tumor, rectum DIVERTICULA NO RESIDUAL GASTROINTESTINAL STROMAL TUMOR IDENTIFIED MARGINS OF RESECTION ARE NEGATIVE FIFTEEN BENIGN LYMPH NODES (0/15) Microscopic Comment 2. GASTROINTESTINAL STROMAL TUMOR (GIST): Procedure: segmental resection Tumor Site: rectum Specify: pelvis rectum Tumor Size: 9.5 cm Tumor Focality Unifocal: focal GIST Subtype (spindle, epithelioid, mixed, etc.): spindle Mitotic Rate: 1 /50 HIGH POWER FIELD Necrosis: negative Histologic Grade 1 G1: Low grade; mitotic rate 5/50 HIGH POWER FIELD. G2: High grade; mitotic rate >5/50 HIGH POWER FIELD. Risk Assessment: _ Margins: positive Ancillary testing: Per request 1 of  3 FINAL for Brianna Walker, Brianna Walker [phone]) Microscopic Comment(continued) Lymph nodes: number examined: 15; number positive: 0 Pathologic Staging: pT3 pN0 Comments: The tumor shows treatment effect with hypocellular, atrophic spindle cells and hyalinized, fibrotic stroma. less than 1% of the tumor without treatment effect. [name] MD Pathologist, Electronic Signature (Case signed 06/10/2015) Specimen Brianna Walker and Clinical Information Specimen(s) Obtained: 1. Soft tissue, biopsy, abdominal wall cyst ? fistula 2. Soft tissue mass, simple excision, Pelvic GIST 3. Colon, segmental resection for tumor, rectum Specimen Clinical Information 1. GIST tumor of rectum in pelvis (kp) Thelton Graca 1. In formalin is a a 3.1 x 1.4 x 0.3 cm irregular portion of fatty tissue, which on sectioning contains a 0.5 cm collapsed smooth cystic space. Sectioned and entirely submitted in one block. 2. Received in formalin is a 176 gram, 9.5 x 6.8 x 5.8 cm rubbery to firm mass, which has an attached suture over a 2.1 x 1.5 cm roughened area, clinically identifying anterior distal. This area is inked orange. There is also a 5.0 x 4.2 cm disrupted area, which is clinically identified as disrupted margin. This area is inked black. Also along one aspect is a 3.4 x 3.3 cm area of tan to hyperemic possible smooth mucosa. The cut surfaces of the mass are tan to tan yellow, with vaguely whorled cut surfaces, and scattered myxoid areas. Representative sections are submitted as follows: A, B = sections from clinically disrupted margin C = sections from area identified by suture as anterior distal D, E = central sections Total: 5 blocks submitted 3. Received in formalin is a 27 cm in length segment of colon, clinically recto-sigmoid, which includes at least probable middle third of rectum. Surrounding mesorectum is incomplete and focally disrupted. There is an attached suture over the anterior perirectal surface. The mucosa is  tan pink, smooth, soft with normal intestinal folds. There are no mucosal lesions identified. Within the sigmoid portion are several unperforated diverticula. Found within the surrounding fat are 15 possible lymph nodes ranging from 0.1 to 0.6 cm. Block Summary: A = proximal margin B, C = distal margin D, E = sections taken at attached suture over anterior mesorectum F = diverticulum G = four nodes, whole H = four nodes, whole I = four nodes, whole J = three nodes, whole Total: 10 blocks submitted (SW:ds 06/07/15) 2 of 3 FINAL for Brianna Walker, Brianna Walker ([id number]) Report signed out from the following location(s) Technical component and interpretation was performed at [facility], [place], [place] [address]. CLIA #: [id number], 3 of 3 Assessment  RECURRENT NAUSEA   Plan:  CT scan to r/o abscess, leak, SBO ,etc  -STRICT I&O  -incr nausea control.  Switch narcotics  -full liquid diet - advance  -HH IVF qMWF to avoid dehydration since orthostatic & fait PO intake to avoid ARF/readmission  -low K - repalce -OSTOMY CARE / TRAINING  -VTE prophylaxis- SCDs, etc  -GERD - PPI  -hyperlipidemia - Tx  -HTN - 1/2 dose ACE Inh with chr diarrhea (diuretic effect) w ileostomy  -h/o PE - inc ASA.  Restart Eliquis if CT negative for abscess  -mobilize as tolerated to help recovery  -Pathology - ?+margin most likely fracture of GIST exposed.  Most likely will get gleevec post-adjuvant for 3 years total per Dr Benay Spice.  D/w him again today  D/C patient from hospital when patient meets criteria (anticipate in ??1-2 day(s)):  Tolerating oral intake well Ambulating well Adequate pain control without IV medications Urinating  Having flatus Ostomy training Disposition planning in place - esp. HH arranged for ileostomy training/care & IVF boluses at home   Adin [name], M.D., F.A.C.S. Gastrointestinal and Minimally Invasive Surgery [facility] [address], [place], [place] [address] [phone] Main / Paging   06/17/2015  Subjective:  Nauseated again Walking OK Tired No anorectal pain No pain at ileostomy    Objective:  Vital signs:  Filed Vitals:   06/16/15 2128 06/17/15 0200 06/17/15 0515 06/17/15 1334  BP: 123/70 136/80 150/87 143/78  Pulse: 105 125 106 101  Temp: 99.5 F (37.5 C) 97.5 F (36.4 C) 99 F (37.2 C) 98.2 F (36.8 C)  TempSrc: Oral Oral Oral Oral  Resp: '16 18 16 16  ' Height:      Weight:      SpO2: 100% 99% 98% 99%    Last BM Date: 06/17/15  Intake/Output   Yesterday:  02/15 0701 - 02/16 0700 In: 1010 [P.O.:480; I.V.:530] Out: 2150 [Urine:1350; Stool:800] This shift:  Total I/O In: -  Out: 375 [Stool:375]  Bowel function:  Flatus: YES  BM: thick effluent in bag.  Occ small BM out anus  Drain: serosanguinous  Physical Exam:  General: Pt awake/alert/oriented x4 in no acute distress.  Tired Eyes: PERRL, normal EOM.  Sclera clear.  No icterus Neuro: CN II-XII intact w/o focal sensory/motor deficits. Lymph: No head/neck/groin lymphadenopathy Psych:  No delerium/psychosis/paranoia HENT: Normocephalic, Mucus membranes moist.  No thrush Neck: Supple, No tracheal deviation Chest: No chest wall pain w good excursion CV:  Pulses intact.  Regular rhythm MS: Normal AROM mjr joints.  No obvious deformity Abdomen: Soft but morbidly obese.  Nondistended.  Nontender.  No fluctuance/cellultis.  No guarding.  No evidence of peritonitis.  No incarcerated hernias.  Loop  ileostomy pink with scant effluent out superior orifice.  No necrosis Ext:  SCDs BLE.  No mjr edema.  No cyanosis Skin: No petechiae / purpura  Results:   Labs: Results for orders placed or performed during the hospital encounter of 06/04/15 (from the past 48 hour(s))  CBC     Status: Abnormal   Collection Time: 06/16/15  3:45 AM  Result Value Ref Range   WBC 10.9 (H) 4.0 - 10.5 K/uL    RBC 3.04 (L) 3.87 - 5.11 MIL/uL   Hemoglobin 8.6 (L) 12.0 - 15.0 g/dL   HCT 26.5 (L) 36.0 - 46.0 %   MCV 87.2 78.0 - 100.0 fL   MCH 28.3 26.0 - 34.0 pg   MCHC 32.5 30.0 - 36.0 g/dL   RDW 15.1 11.5 - 15.5 %   Platelets 454 (H) 150 - 400 K/uL  Basic metabolic panel     Status: None   Collection Time: 06/16/15  3:45 AM  Result Value Ref Range   Sodium 138 135 - 145 mmol/L   Potassium 3.7 3.5 - 5.1 mmol/L   Chloride 102 101 - 111 mmol/L   CO2 26 22 - 32 mmol/L   Glucose, Bld 89 65 - 99 mg/dL   BUN 9 6 - 20 mg/dL   Creatinine, Ser 0.91 0.44 - 1.00 mg/dL   Calcium 9.1 8.9 - 10.3 mg/dL   GFR calc non Af Amer >60 >60 mL/min   GFR calc Af Amer >60 >60 mL/min    Comment: (NOTE) The eGFR has been calculated using the CKD EPI equation. This calculation has not been validated in all clinical situations. eGFR's persistently <60 mL/min signify possible Chronic Kidney Disease.    Anion gap 10 5 - 15  CBC     Status: Abnormal   Collection Time: 06/17/15  4:00 AM  Result Value Ref Range   WBC 11.6 (H) 4.0 - 10.5 K/uL   RBC 3.25 (L) 3.87 - 5.11 MIL/uL   Hemoglobin 9.3 (L) 12.0 - 15.0 g/dL   HCT 28.2 (L) 36.0 - 46.0 %   MCV 86.8 78.0 - 100.0 fL   MCH 28.6 26.0 - 34.0 pg   MCHC 33.0 30.0 - 36.0 g/dL   RDW 15.3 11.5 - 15.5 %   Platelets 507 (H) 150 - 400 K/uL  Basic metabolic panel     Status: Abnormal   Collection Time: 06/17/15  4:00 AM  Result Value Ref Range   Sodium 137 135 - 145 mmol/L   Potassium 3.3 (L) 3.5 - 5.1 mmol/L   Chloride 98 (L) 101 - 111 mmol/L   CO2 27 22 - 32 mmol/L   Glucose, Bld 108 (H) 65 - 99 mg/dL   BUN 12 6 - 20 mg/dL   Creatinine, Ser 0.94 0.44 - 1.00 mg/dL   Calcium 8.9 8.9 - 10.3 mg/dL   GFR calc non Af Amer >60 >60 mL/min   GFR calc Af Amer >60 >60 mL/min    Comment: (NOTE) The eGFR has been calculated using the CKD EPI equation. This calculation has not been validated in all clinical situations. eGFR's persistently <60 mL/min signify possible Chronic  Kidney Disease.    Anion gap 12 5 - 15      Imaging / Studies: Dg Abd Acute W/chest  06/15/2015  CLINICAL DATA:  70 year old with resection of a large a no rectal GI stromal tumor on 06/04/2015 and resection of a cystic mass involving the anterior abdominal wall attached to a loop of  mid ileum with serosal repair. Acute onset of nausea and vomiting which began last night. EXAM: DG ABDOMEN ACUTE W/ 1V CHEST COMPARISON:  MRI pelvis 12/14/2014. CT abdomen and pelvis 07/22/2014. Two-view chest x-ray 12/05/2011. FINDINGS: Multiple moderate to markedly distended loops of small bowel in the upper abdomen, demonstrating air-fluid levels on the erect image. Remainder of the small bowel normal in caliber. No evidence of free intraperitoneal air on the erect image. Subcutaneous emphysema throughout the anterior abdominal wall. Surgical mesh material in the right mid abdominal wall. Surgical clips in the right upper quadrant from prior cholecystectomy. Degenerative changes involving the lower lumbar spine. Prior right hip arthroplasty with anatomic alignment in the AP projection. Moderate degenerative changes involving the left hip. Right arm PICC tip projects over the lower SVC. Cardiac silhouette upper normal in size to mildly enlarged, unchanged. Lungs clear. Bronchovascular markings normal. Pulmonary vascularity normal. No visible pleural effusions. No pneumothorax. IMPRESSION: 1. Partial small bowel obstruction versus severe focal ileus in the upper abdomen. Partial obstruction is favored. 2. No free intraperitoneal air. 3. Extensive subcutaneous emphysema. 4. Borderline heart size.  No acute cardiopulmonary disease. 5. Right arm PICC tip projects over the lower SVC. Electronically Signed   By: Evangeline Dakin M.D.   On: 06/15/2015 14:57    Medications / Allergies: per chart  Antibiotics: Anti-infectives    Start     Dose/Rate Route Frequency Ordered Stop   06/05/15 0200  cefoTEtan (CEFOTAN) 2 g in  dextrose 5 % 50 mL IVPB     2 g 100 mL/hr over 30 Minutes Intravenous Every 12 hours 06/04/15 1702 06/05/15 0222   06/04/15 1044  clindamycin (CLEOCIN) 900 mg, gentamicin (GARAMYCIN) 240 mg in sodium chloride 0.9 % 1,000 mL for intraperitoneal lavage  Status:  Discontinued       As needed 06/04/15 1044 06/04/15 1545   06/04/15 0613  cefoTEtan (CEFOTAN) 2 g in dextrose 5 % 50 mL IVPB     2 g 100 mL/hr over 30 Minutes Intravenous On call to O.R. 06/04/15 6010 06/04/15 1345   06/03/15 1400  clindamycin (CLEOCIN) 900 mg, gentamicin (GARAMYCIN) 240 mg in sodium chloride 0.9 % 1,000 mL for intraperitoneal lavage  Status:  Discontinued    Comments:  Pharmacy may adjust dosing strength, schedule, rate of infusion, etc as needed to optimize therapy    Intraperitoneal To Surgery 06/03/15 1351 06/04/15 1703        Note: Portions of this report may have been transcribed using voice recognition software. Every effort was made to ensure accuracy; however, inadvertent computerized transcription errors may be present.   Any transcriptional errors that result from this process are unintentional.     Adin [name], M.D., F.A.C.S. Gastrointestinal and Minimally Invasive Surgery [facility] [address], [place], [place] [address] [phone] Main / Paging   06/17/2015  CARE TEAM:  PCP: Cathlean Cower, MD  Outpatient Care Team: Patient Care Team: Biagio Borg, MD as PCP - General Michael Boston, MD as Consulting Physician (General Surgery) Ladell Pier, MD as Consulting Physician (Oncology) Milus Banister, MD as Consulting Physician (Gastroenterology) Billy Fischer, MD as Consulting Physician (Family Medicine)  Inpatient Treatment Team: Treatment Team: Attending Provider: Michael Boston, MD; Technician: Tenna Child, [place]; Consulting Physician: Ladell Pier, MD; Registered Nurse: Guadalupe Dawn, RN; Registered Nurse: Celedonio Savage, RN;  Technician: Sueanne Margarita, NT

## 2015-06-17 NOTE — Progress Notes (Signed)
Vomited large amt greenish brown liquid despite prn zofran. C-t notified

## 2015-06-17 NOTE — Progress Notes (Signed)
ON CALL MD paged re: CT results at 72. Awaiting call back at this time. Petra Kuba, RN

## 2015-06-18 LAB — BASIC METABOLIC PANEL
Anion gap: 11 (ref 5–15)
BUN: 10 mg/dL (ref 6–20)
CALCIUM: 8.8 mg/dL — AB (ref 8.9–10.3)
CHLORIDE: 102 mmol/L (ref 101–111)
CO2: 27 mmol/L (ref 22–32)
CREATININE: 0.98 mg/dL (ref 0.44–1.00)
GFR calc non Af Amer: 58 mL/min — ABNORMAL LOW (ref >60)
GLUCOSE: 82 mg/dL (ref 65–99)
Potassium: 3.6 mmol/L (ref 3.5–5.1)
Sodium: 140 mmol/L (ref 135–145)

## 2015-06-18 LAB — MAGNESIUM: MAGNESIUM: 1.8 mg/dL (ref 1.7–2.4)

## 2015-06-18 LAB — CBC
HEMATOCRIT: 26.2 % — AB (ref 36.0–46.0)
HEMOGLOBIN: 8.7 g/dL — AB (ref 12.0–15.0)
MCH: 28.5 pg (ref 26.0–34.0)
MCHC: 33.2 g/dL (ref 30.0–36.0)
MCV: 85.9 fL (ref 78.0–100.0)
Platelets: 472 10*3/uL — ABNORMAL HIGH (ref 150–400)
RBC: 3.05 MIL/uL — ABNORMAL LOW (ref 3.87–5.11)
RDW: 15 % (ref 11.5–15.5)
WBC: 10.7 10*3/uL — ABNORMAL HIGH (ref 4.0–10.5)

## 2015-06-18 MED ORDER — HEPARIN SOD (PORK) LOCK FLUSH 100 UNIT/ML IV SOLN
250.0000 [IU] | Freq: Every day | INTRAVENOUS | Status: DC
  Filled 2015-06-18: qty 3

## 2015-06-18 MED ORDER — HEPARIN SOD (PORK) LOCK FLUSH 100 UNIT/ML IV SOLN
250.0000 [IU] | INTRAVENOUS | Status: DC | PRN
  Administered 2015-06-18: 250 [IU]
  Filled 2015-06-18 (×2): qty 3

## 2015-06-18 MED ORDER — SIMETHICONE 80 MG PO CHEW
80.0000 mg | CHEWABLE_TABLET | Freq: Four times a day (QID) | ORAL | Status: DC
  Administered 2015-06-18: 80 mg via ORAL
  Filled 2015-06-18 (×4): qty 1

## 2015-06-18 MED ORDER — LACTATED RINGERS IV BOLUS (SEPSIS)
2000.0000 mL | Freq: Once | INTRAVENOUS | Status: AC
  Administered 2015-06-18: 1000 mL via INTRAVENOUS

## 2015-06-18 MED ORDER — APIXABAN 5 MG PO TABS: 5.0000 mg | ORAL_TABLET | Freq: Two times a day (BID) | ORAL | Status: DC

## 2015-06-18 NOTE — Consult Note (Signed)
WOC ostomy follow up Stoma type/location: RLQ loop ileostomy. Pouch leaking upon my arrival. Stomal assessment/size: 1 and 5/8 inches with os at proximal site.  Stomal mucosa is sloughing today and I trimmed away the avascular tissue Peristomal assessment: Intact Treatment options for stomal/peristomal skin: skin barrier ring and convex 1-piece pouch Output thin brown effluent Ostomy pouching: 1pc. Convex pouch with skin barrier ring Education provided: Patient is taught that the stoma has changed slightly and that a 1-piece pouching system with a skin barrier ring is now necessary.  She watches the application and thinks that it may actually be easier.  Teaching about fluid replacement is reinforced. Supplies ordered for discharge and [employer] personnel notified of product change. Enrolled patient in [facility] Start Discharge program: Yes [place] nursing team will follow, and will remain available to this patient, the nursing and medical teams.   Thanks, Maudie Flakes, MSN, RN, [place], Arther Abbott  Pager# [phone]

## 2015-06-18 NOTE — Consult Note (Signed)
WOC ostomy follow up: Called to patient's room as the previous pouch applied is already leaking. Stoma type/location: RLQ loop ileostomy Stomal assessment/size:1 and 5/8 inches  Peristomal assessment: intact Treatment options for stomal/peristomal skin: Two skin barrier rings added to back to pouch, also an ostomy appliance belt used to place additional pressure at 3 and 9 o'clock. Output Thin brown effluent with food particles Ostomy pouching: 1pc.convex pouch with two skin barrier rings and ostomy belt.  Education provided: Patient shown that two ostomy rings are placed on the back of the pouch. Enrolled patient in [place] Start Discharge program: Yes [facility] nursing team will not follow, but will remain available to this patient, the nursing and medical teams.  Please re-consult if needed. Thanks, Maudie Flakes, MSN, RN, [place], Arther Abbott  Pager# [phone]

## 2015-06-18 NOTE — Progress Notes (Signed)
OT Cancellation Note  Patient Details Name: Brianna Walker MRN: [id number] DOB: 1946/04/16   Cancelled Treatment:    Reason Eval/Treat Not Completed: Other (comment).  Pt plans d/c today. Educated on tub DME. She plans to sponge bathe at home. Will sign off.  Pt had no further questions for OT.  Akon Reinoso 06/18/2015, 11:49 AM  Lesle Chris, OTR/L [phone] 06/18/2015

## 2015-06-18 NOTE — Progress Notes (Signed)
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 Resection of cystic mass near small bowel with serosal repair.  Surgeon(s): Michael Boston, MD Leighton Ruff, MD - Assist   Diagnosis 1. Soft tissue, biopsy, abdominal wall cyst ? fistula BENIGN CYST AND REACTIVE CHANGES NEGATIVE FOR MALIGNANCY 2. Soft tissue mass, simple excision, Pelvic GIST GASTROINTESTINAL STROMAL TUMORS OF THE RECTUM SHOWING TREATMENT EFFECT (9.5 CM) MARGIN OF RESECTION IS POSITIVE 3. Colon, segmental resection for tumor, rectum DIVERTICULA NO RESIDUAL GASTROINTESTINAL STROMAL TUMOR IDENTIFIED MARGINS OF RESECTION ARE NEGATIVE FIFTEEN BENIGN LYMPH NODES (0/15) Microscopic Comment 2. GASTROINTESTINAL STROMAL TUMOR (GIST): Procedure: segmental resection Tumor Site: rectum Specify: pelvis rectum Tumor Size: 9.5 cm Tumor Focality Unifocal: focal GIST Subtype (spindle, epithelioid, mixed, etc.): spindle Mitotic Rate: 1 /50 HIGH POWER FIELD Necrosis: negative Histologic Grade 1 G1: Low grade; mitotic rate 5/50 HIGH POWER FIELD. G2: High grade; mitotic rate >5/50 HIGH POWER FIELD. Risk Assessment: _ Margins: positive Ancillary testing: Per  request 1 of 3 FINAL for Brianna, Walker [phone]) Microscopic Comment(continued) Lymph nodes: number examined: 15; number positive: 0 Pathologic Staging: pT3 pN0 Comments: The tumor shows treatment effect with hypocellular, atrophic spindle cells and hyalinized, fibrotic stroma. less than 1% of the tumor without treatment effect. [name] MD Pathologist, Electronic Signature (Case signed 06/10/2015) Specimen Brianna Walker and Clinical Information Specimen(s) Obtained: 1. Soft tissue, biopsy, abdominal wall cyst ? fistula 2. Soft tissue mass, simple excision, Pelvic GIST 3. Colon, segmental resection for tumor, rectum Specimen Clinical Information 1. GIST tumor of rectum in pelvis (kp) Brianna Walker 1. In formalin is a a 3.1 x 1.4 x 0.3 cm irregular portion of fatty tissue, which on sectioning contains a 0.5 cm collapsed smooth cystic space. Sectioned and entirely submitted in one block. 2. Received in formalin is a 176 gram, 9.5 x 6.8 x 5.8 cm rubbery to firm mass, which has an attached suture over a 2.1 x 1.5 cm roughened area, clinically identifying anterior distal. This area is inked orange. There is also a 5.0 x 4.2 cm disrupted area, which is clinically identified as disrupted margin. This area is inked black. Also along one aspect is a 3.4 x 3.3 cm area of tan to hyperemic possible smooth mucosa. The cut surfaces of the mass are tan to tan yellow, with vaguely whorled cut surfaces, and scattered myxoid areas. Representative sections are submitted as follows: A, B = sections from clinically disrupted margin C = sections from area identified by suture as anterior distal D, E = central sections Total: 5 blocks submitted 3. Received in formalin is a 27 cm in length segment of colon, clinically recto-sigmoid, which includes at least probable middle third of rectum. Surrounding mesorectum is incomplete and focally disrupted. There is an attached suture over the anterior perirectal surface.  The mucosa is tan pink, smooth, soft with normal intestinal folds. There are no mucosal lesions identified. Within the sigmoid portion are several unperforated diverticula. Found within the surrounding fat are 15 possible lymph nodes ranging from 0.1 to 0.6 cm. Block Summary: A = proximal margin B, C = distal margin D, E = sections taken at attached suture over anterior mesorectum F = diverticulum G = four nodes, whole H = four nodes, whole I = four nodes, whole J = three nodes, whole Total: 10 blocks submitted (SW:ds 06/07/15) 2 of 3 FINAL for Brianna, Walker ([id number]) Report signed out from the following location(s) Technical component and interpretation was performed at [facility], [place], [place] [address]. CLIA #: [id number], 3 of 3 Assessment  RECURRENT NAUSEA but better  Plan:  CT scan without abscess, leak, SBO ,etc  Retry PO  -STRICT I&O  -incr nausea control.  Switched narcotics  -full liquid diet - advance  -HH IVF qMWF to avoid dehydration since orthostatic & fait PO intake to avoid ARF/readmission  -low K - repalce -OSTOMY CARE / TRAINING  -VTE prophylaxis- SCDs, etc  -GERD - PPI  -hyperlipidemia - Tx  -HTN - 1/2 dose ACE Inh with chr diarrhea (diuretic effect) w ileostomy  -h/o PE - inc ASA.  Restart Eliquis if CT negative for abscess  -mobilize as tolerated to help recovery  -Pathology - ?+margin most likely fracture of GIST exposed.  Most likely will get gleevec post-adjuvant for 3 years total per Dr Benay Spice.  D/w him again today  D/C patient from hospital when patient meets criteria (anticipate later today):  Tolerating oral intake well Ambulating well Adequate pain control without IV medications Urinating  Having flatus Ostomy training Disposition planning in place - esp. HH arranged for ileostomy training/care & IVF boluses at home   Adin [name], M.D., F.A.C.S. Gastrointestinal and Minimally  Invasive Surgery [facility] [address], [place], [place] [address] [phone] Main / Paging   06/18/2015  Subjective:  Nausea gone since CT scan Hungry Walking OK No anorectal pain No pain at ileostomy    Objective:  Vital signs:  Filed Vitals:   06/17/15 0515 06/17/15 1334 06/17/15 2239 06/18/15 0300  BP: 150/87 143/78 140/70 142/78  Pulse: 106 101 97 104  Temp: 99 F (37.2 C) 98.2 F (36.8 C) 98.2 F (36.8 C) 98.1 F (36.7 C)  TempSrc: Oral Oral Oral Oral  Resp: '16 16 16 16  ' Height:      Weight:   94.4 kg (208 lb 1.8 oz)   SpO2: 98% 99% 98% 99%    Last BM Date: 06/17/15  Intake/Output   Yesterday:  02/16 0701 - 02/17 0700 In: 394.7 [P.O.:180; I.V.:214.7] Out: 1950 [Urine:500; Emesis/NG output:1000; Stool:450] This shift:     Bowel function:  Flatus: YES  BM: Mod volume thick effluent in bag.  Occ small BM out anus  Drain: serosanguinous  Physical Exam:  General: Pt awake/alert/oriented x4 in no acute distress.  Smiling, relaxed Eyes: PERRL, normal EOM.  Sclera clear.  No icterus Neuro: CN II-XII intact w/o focal sensory/motor deficits. Lymph: No head/neck/groin lymphadenopathy Psych:  No delerium/psychosis/paranoia HENT: Normocephalic, Mucus membranes moist.  No thrush Neck: Supple, No tracheal deviation Chest: No chest wall pain w good excursion CV:  Pulses intact.  Regular rhythm MS: Normal AROM mjr joints.  No obvious deformity Abdomen: Soft but morbidly obese.  Nondistended.  Nontender.  No fluctuance/cellultis.  No  guarding.  No evidence of peritonitis.  No incarcerated hernias.  Loop ileostomy pink with scant effluent out superior orifice.  No necrosis Ext:  SCDs BLE.  No mjr edema.  No cyanosis Skin: No petechiae / purpura  Results:   Labs: Results for orders placed or performed during the hospital encounter of 06/04/15 (from the past 48 hour(s))  CBC     Status: Abnormal   Collection Time:  06/17/15  4:00 AM  Result Value Ref Range   WBC 11.6 (H) 4.0 - 10.5 K/uL   RBC 3.25 (L) 3.87 - 5.11 MIL/uL   Hemoglobin 9.3 (L) 12.0 - 15.0 g/dL   HCT 28.2 (L) 36.0 - 46.0 %   MCV 86.8 78.0 - 100.0 fL   MCH 28.6 26.0 - 34.0 pg   MCHC 33.0 30.0 - 36.0 g/dL   RDW 15.3 11.5 - 15.5 %   Platelets 507 (H) 150 - 400 K/uL  Basic metabolic panel     Status: Abnormal   Collection Time: 06/17/15  4:00 AM  Result Value Ref Range   Sodium 137 135 - 145 mmol/L   Potassium 3.3 (L) 3.5 - 5.1 mmol/L   Chloride 98 (L) 101 - 111 mmol/L   CO2 27 22 - 32 mmol/L   Glucose, Bld 108 (H) 65 - 99 mg/dL   BUN 12 6 - 20 mg/dL   Creatinine, Ser 0.94 0.44 - 1.00 mg/dL   Calcium 8.9 8.9 - 10.3 mg/dL   GFR calc non Af Amer >60 >60 mL/min   GFR calc Af Amer >60 >60 mL/min    Comment: (NOTE) The eGFR has been calculated using the CKD EPI equation. This calculation has not been validated in all clinical situations. eGFR's persistently <60 mL/min signify possible Chronic Kidney Disease.    Anion gap 12 5 - 15  Magnesium     Status: None   Collection Time: 06/18/15  3:38 AM  Result Value Ref Range   Magnesium 1.8 1.7 - 2.4 mg/dL  CBC     Status: Abnormal   Collection Time: 06/18/15  3:38 AM  Result Value Ref Range   WBC 10.7 (H) 4.0 - 10.5 K/uL   RBC 3.05 (L) 3.87 - 5.11 MIL/uL   Hemoglobin 8.7 (L) 12.0 - 15.0 g/dL   HCT 26.2 (L) 36.0 - 46.0 %   MCV 85.9 78.0 - 100.0 fL   MCH 28.5 26.0 - 34.0 pg   MCHC 33.2 30.0 - 36.0 g/dL   RDW 15.0 11.5 - 15.5 %   Platelets 472 (H) 150 - 400 K/uL  Basic metabolic panel     Status: Abnormal   Collection Time: 06/18/15  3:38 AM  Result Value Ref Range   Sodium 140 135 - 145 mmol/L   Potassium 3.6 3.5 - 5.1 mmol/L   Chloride 102 101 - 111 mmol/L   CO2 27 22 - 32 mmol/L   Glucose, Bld 82 65 - 99 mg/dL   BUN 10 6 - 20 mg/dL   Creatinine, Ser 0.98 0.44 - 1.00 mg/dL   Calcium 8.8 (L) 8.9 - 10.3 mg/dL   GFR calc non Af Amer 58 (L) >60 mL/min   GFR calc Af Amer >60  >60 mL/min    Comment: (NOTE) The eGFR has been calculated using the CKD EPI equation. This calculation has not been validated in all clinical situations. eGFR's persistently <60 mL/min signify possible Chronic Kidney Disease.    Anion gap 11 5 - 15  Imaging / Studies: Ct Abdomen Pelvis W Contrast  06/17/2015  CLINICAL DATA:  Status post low anterior resection of colon EXAM: CT ABDOMEN AND PELVIS WITH CONTRAST TECHNIQUE: Multidetector CT imaging of the abdomen and pelvis was performed using the standard protocol following bolus administration of intravenous contrast. CONTRAST:  172m OMNIPAQUE IOHEXOL 300 MG/ML  SOLN COMPARISON:  07/22/2014, 06/15/2015 FINDINGS: Lung bases are free of acute infiltrate or sizable effusion. The gallbladder has been surgically removed. The liver, spleen, adrenal glands and pancreas are within normal limits. The kidneys demonstrate a normal enhancement pattern bilaterally. A few small cysts are seen. No obstructive changes are noted. No renal calculi are noted. Considerable air is noted within the abdominal wall related to the recent surgery. There is considerable of the high ablation of the proximal jejunum identified. The loops are fluid filled. No definitive obstructive mass is seen although transition zone is noted as seen on images 45-48 of series 2. The overall appearance is stable when compared with the plain film from 06/15/2015. Again this may represent a degree of ileus. A loop ileostomy is noted in the right mid abdomen. A chronic seroma is again identified in the anterior abdominal wall adjacent to the area of the loop ostomy. The bladder is well distended. No free pelvic fluid is noted. No abscess or postoperative fluid collection is identified. No bony abnormality is seen. Prior right hip replacement is noted. IMPRESSION: Considerable amount of retained air within the abdominal wall related to the recent surgery. Dilatation of the jejunum likely  representing an ileus or partial small bowel obstruction. No obstructing lesion is identified. Changes consistent with the recent surgery with loop ileostomy on the right. Chronic postoperative seroma stable from multiple previous exams. No definitive abscess is seen. Electronically Signed   By: MInez CatalinaM.D.   On: 06/17/2015 18:39    Medications / Allergies: per chart  Antibiotics: Anti-infectives    Start     Dose/Rate Route Frequency Ordered Stop   06/05/15 0200  cefoTEtan (CEFOTAN) 2 g in dextrose 5 % 50 mL IVPB     2 g 100 mL/hr over 30 Minutes Intravenous Every 12 hours 06/04/15 1702 06/05/15 0222   06/04/15 1044  clindamycin (CLEOCIN) 900 mg, gentamicin (GARAMYCIN) 240 mg in sodium chloride 0.9 % 1,000 mL for intraperitoneal lavage  Status:  Discontinued       As needed 06/04/15 1044 06/04/15 1545   06/04/15 0613  cefoTEtan (CEFOTAN) 2 g in dextrose 5 % 50 mL IVPB     2 g 100 mL/hr over 30 Minutes Intravenous On call to O.R. 06/04/15 0161002/03/17 1345   06/03/15 1400  clindamycin (CLEOCIN) 900 mg, gentamicin (GARAMYCIN) 240 mg in sodium chloride 0.9 % 1,000 mL for intraperitoneal lavage  Status:  Discontinued    Comments:  Pharmacy may adjust dosing strength, schedule, rate of infusion, etc as needed to optimize therapy    Intraperitoneal To Surgery 06/03/15 1351 06/04/15 1703        Note: Portions of this report may have been transcribed using voice recognition software. Every effort was made to ensure accuracy; however, inadvertent computerized transcription errors may be present.   Any transcriptional errors that result from this process are unintentional.     SAdin [name] M.D., F.A.C.S. Gastrointestinal and Minimally Invasive Surgery [facility] [address] [place] [id number]([phone] / Paging   06/18/2015  CARE TEAM:  PCP: JCathlean Cower MD  Outpatient Care  Team: Patient Care Team: Biagio Borg, MD as  PCP - General Michael Boston, MD as Consulting Physician (General Surgery) Ladell Pier, MD as Consulting Physician (Oncology) Milus Banister, MD as Consulting Physician (Gastroenterology) Billy Fischer, MD as Consulting Physician (Family Medicine)  Inpatient Treatment Team: Treatment Team: Attending Provider: Michael Boston, MD; Consulting Physician: Ladell Pier, MD; Registered Nurse: Celedonio Savage, RN; Technician: Sueanne Margarita, NT; Registered Nurse: Dorita Sciara, RN; Registered Nurse: Cindy Hazy, RN; Registered Nurse: Coralie Common, RN; Registered Nurse: Vicente Serene, RN; Occupational Therapist: Lesle Chris, OT

## 2015-06-18 NOTE — Progress Notes (Signed)
Spoke with Dr Johney Maine regarding status of patient PICC line at discharge.  Orders received .

## 2015-06-19 DIAGNOSIS — I10 Essential (primary) hypertension: Secondary | ICD-10-CM | POA: Diagnosis not present

## 2015-06-19 DIAGNOSIS — Z432 Encounter for attention to ileostomy: Secondary | ICD-10-CM | POA: Diagnosis not present

## 2015-06-19 DIAGNOSIS — D649 Anemia, unspecified: Secondary | ICD-10-CM | POA: Diagnosis not present

## 2015-06-19 DIAGNOSIS — Z452 Encounter for adjustment and management of vascular access device: Secondary | ICD-10-CM | POA: Diagnosis not present

## 2015-06-19 DIAGNOSIS — D571 Sickle-cell disease without crisis: Secondary | ICD-10-CM | POA: Diagnosis not present

## 2015-06-19 DIAGNOSIS — C49A4 Gastrointestinal stromal tumor of large intestine: Secondary | ICD-10-CM | POA: Diagnosis not present

## 2015-06-19 DIAGNOSIS — K219 Gastro-esophageal reflux disease without esophagitis: Secondary | ICD-10-CM | POA: Diagnosis not present

## 2015-06-19 DIAGNOSIS — K5909 Other constipation: Secondary | ICD-10-CM | POA: Diagnosis not present

## 2015-06-19 DIAGNOSIS — G894 Chronic pain syndrome: Secondary | ICD-10-CM | POA: Diagnosis not present

## 2015-06-21 ENCOUNTER — Other Ambulatory Visit (HOSPITAL_COMMUNITY): Payer: Commercial Managed Care - HMO

## 2015-06-21 ENCOUNTER — Inpatient Hospital Stay (HOSPITAL_COMMUNITY)
Admission: EM | Admit: 2015-06-21 | Discharge: 2015-07-06 | DRG: 682 | Disposition: A | Discharge status: Skilled Nursing Facility | Payer: Commercial Managed Care - HMO | Attending: Internal Medicine | Admitting: Internal Medicine

## 2015-06-21 ENCOUNTER — Emergency Department (HOSPITAL_COMMUNITY): Payer: Commercial Managed Care - HMO

## 2015-06-21 ENCOUNTER — Inpatient Hospital Stay (HOSPITAL_COMMUNITY): Payer: Commercial Managed Care - HMO

## 2015-06-21 ENCOUNTER — Encounter (HOSPITAL_COMMUNITY): Payer: Self-pay | Admitting: *Deleted

## 2015-06-21 DIAGNOSIS — Z8701 Personal history of pneumonia (recurrent): Secondary | ICD-10-CM | POA: Diagnosis not present

## 2015-06-21 DIAGNOSIS — C49A5 Gastrointestinal stromal tumor of rectum: Secondary | ICD-10-CM | POA: Diagnosis present

## 2015-06-21 DIAGNOSIS — Z9049 Acquired absence of other specified parts of digestive tract: Secondary | ICD-10-CM

## 2015-06-21 DIAGNOSIS — Z86010 Personal history of colonic polyps: Secondary | ICD-10-CM

## 2015-06-21 DIAGNOSIS — N3281 Overactive bladder: Secondary | ICD-10-CM | POA: Diagnosis present

## 2015-06-21 DIAGNOSIS — R000 Tachycardia, unspecified: Secondary | ICD-10-CM | POA: Diagnosis not present

## 2015-06-21 DIAGNOSIS — R222 Localized swelling, mass and lump, trunk: Secondary | ICD-10-CM

## 2015-06-21 DIAGNOSIS — C801 Malignant (primary) neoplasm, unspecified: Secondary | ICD-10-CM | POA: Diagnosis not present

## 2015-06-21 DIAGNOSIS — R112 Nausea with vomiting, unspecified: Secondary | ICD-10-CM

## 2015-06-21 DIAGNOSIS — E876 Hypokalemia: Secondary | ICD-10-CM | POA: Diagnosis not present

## 2015-06-21 DIAGNOSIS — Z7982 Long term (current) use of aspirin: Secondary | ICD-10-CM

## 2015-06-21 DIAGNOSIS — D481 Neoplasm of uncertain behavior of connective and other soft tissue: Secondary | ICD-10-CM | POA: Diagnosis not present

## 2015-06-21 DIAGNOSIS — K9413 Enterostomy malfunction: Secondary | ICD-10-CM | POA: Diagnosis not present

## 2015-06-21 DIAGNOSIS — Z800 Family history of malignant neoplasm of digestive organs: Secondary | ICD-10-CM | POA: Diagnosis not present

## 2015-06-21 DIAGNOSIS — Y833 Surgical operation with formation of external stoma as the cause of abnormal reaction of the patient, or of later complication, without mention of misadventure at the time of the procedure: Secondary | ICD-10-CM | POA: Diagnosis present

## 2015-06-21 DIAGNOSIS — E43 Unspecified severe protein-calorie malnutrition: Secondary | ICD-10-CM | POA: Diagnosis not present

## 2015-06-21 DIAGNOSIS — I951 Orthostatic hypotension: Secondary | ICD-10-CM

## 2015-06-21 DIAGNOSIS — Z8672 Personal history of thrombophlebitis: Secondary | ICD-10-CM

## 2015-06-21 DIAGNOSIS — Z86711 Personal history of pulmonary embolism: Secondary | ICD-10-CM

## 2015-06-21 DIAGNOSIS — D638 Anemia in other chronic diseases classified elsewhere: Secondary | ICD-10-CM | POA: Diagnosis present

## 2015-06-21 DIAGNOSIS — R935 Abnormal findings on diagnostic imaging of other abdominal regions, including retroperitoneum: Secondary | ICD-10-CM | POA: Diagnosis not present

## 2015-06-21 DIAGNOSIS — K5730 Diverticulosis of large intestine without perforation or abscess without bleeding: Secondary | ICD-10-CM | POA: Diagnosis present

## 2015-06-21 DIAGNOSIS — R1111 Vomiting without nausea: Secondary | ICD-10-CM | POA: Diagnosis not present

## 2015-06-21 DIAGNOSIS — K567 Ileus, unspecified: Secondary | ICD-10-CM | POA: Diagnosis not present

## 2015-06-21 DIAGNOSIS — Z6835 Body mass index (BMI) 35.0-35.9, adult: Secondary | ICD-10-CM

## 2015-06-21 DIAGNOSIS — I10 Essential (primary) hypertension: Secondary | ICD-10-CM | POA: Diagnosis present

## 2015-06-21 DIAGNOSIS — E669 Obesity, unspecified: Secondary | ICD-10-CM | POA: Diagnosis not present

## 2015-06-21 DIAGNOSIS — Z833 Family history of diabetes mellitus: Secondary | ICD-10-CM

## 2015-06-21 DIAGNOSIS — K3184 Gastroparesis: Secondary | ICD-10-CM | POA: Diagnosis not present

## 2015-06-21 DIAGNOSIS — D509 Iron deficiency anemia, unspecified: Secondary | ICD-10-CM | POA: Diagnosis present

## 2015-06-21 DIAGNOSIS — Z7901 Long term (current) use of anticoagulants: Secondary | ICD-10-CM | POA: Diagnosis not present

## 2015-06-21 DIAGNOSIS — Z8249 Family history of ischemic heart disease and other diseases of the circulatory system: Secondary | ICD-10-CM | POA: Diagnosis not present

## 2015-06-21 DIAGNOSIS — R198 Other specified symptoms and signs involving the digestive system and abdomen: Secondary | ICD-10-CM | POA: Diagnosis not present

## 2015-06-21 DIAGNOSIS — K219 Gastro-esophageal reflux disease without esophagitis: Secondary | ICD-10-CM | POA: Diagnosis not present

## 2015-06-21 DIAGNOSIS — R279 Unspecified lack of coordination: Secondary | ICD-10-CM | POA: Diagnosis not present

## 2015-06-21 DIAGNOSIS — I9789 Other postprocedural complications and disorders of the circulatory system, not elsewhere classified: Secondary | ICD-10-CM

## 2015-06-21 DIAGNOSIS — N179 Acute kidney failure, unspecified: Principal | ICD-10-CM | POA: Diagnosis present

## 2015-06-21 DIAGNOSIS — Z79899 Other long term (current) drug therapy: Secondary | ICD-10-CM | POA: Diagnosis not present

## 2015-06-21 DIAGNOSIS — I96 Gangrene, not elsewhere classified: Secondary | ICD-10-CM

## 2015-06-21 DIAGNOSIS — E785 Hyperlipidemia, unspecified: Secondary | ICD-10-CM | POA: Diagnosis not present

## 2015-06-21 DIAGNOSIS — Z96641 Presence of right artificial hip joint: Secondary | ICD-10-CM | POA: Diagnosis present

## 2015-06-21 DIAGNOSIS — I959 Hypotension, unspecified: Secondary | ICD-10-CM | POA: Insufficient documentation

## 2015-06-21 DIAGNOSIS — Z86718 Personal history of other venous thrombosis and embolism: Secondary | ICD-10-CM | POA: Diagnosis not present

## 2015-06-21 DIAGNOSIS — K5660 Unspecified intestinal obstruction: Secondary | ICD-10-CM | POA: Diagnosis not present

## 2015-06-21 DIAGNOSIS — Z87891 Personal history of nicotine dependence: Secondary | ICD-10-CM

## 2015-06-21 DIAGNOSIS — R1110 Vomiting, unspecified: Secondary | ICD-10-CM | POA: Diagnosis not present

## 2015-06-21 DIAGNOSIS — D490 Neoplasm of unspecified behavior of digestive system: Secondary | ICD-10-CM | POA: Diagnosis not present

## 2015-06-21 DIAGNOSIS — Z932 Ileostomy status: Secondary | ICD-10-CM | POA: Diagnosis not present

## 2015-06-21 DIAGNOSIS — C49A4 Gastrointestinal stromal tumor of large intestine: Secondary | ICD-10-CM | POA: Diagnosis present

## 2015-06-21 DIAGNOSIS — K449 Diaphragmatic hernia without obstruction or gangrene: Secondary | ICD-10-CM | POA: Diagnosis not present

## 2015-06-21 DIAGNOSIS — R0789 Other chest pain: Secondary | ICD-10-CM

## 2015-06-21 DIAGNOSIS — D4959 Neoplasm of unspecified behavior of other genitourinary organ: Secondary | ICD-10-CM | POA: Diagnosis not present

## 2015-06-21 DIAGNOSIS — F419 Anxiety disorder, unspecified: Secondary | ICD-10-CM | POA: Diagnosis not present

## 2015-06-21 DIAGNOSIS — E860 Dehydration: Secondary | ICD-10-CM

## 2015-06-21 DIAGNOSIS — K30 Functional dyspepsia: Secondary | ICD-10-CM | POA: Diagnosis not present

## 2015-06-21 DIAGNOSIS — E46 Unspecified protein-calorie malnutrition: Secondary | ICD-10-CM | POA: Diagnosis not present

## 2015-06-21 DIAGNOSIS — M6281 Muscle weakness (generalized): Secondary | ICD-10-CM | POA: Diagnosis not present

## 2015-06-21 DIAGNOSIS — R109 Unspecified abdominal pain: Secondary | ICD-10-CM | POA: Diagnosis not present

## 2015-06-21 HISTORY — DX: Acute kidney failure, unspecified: N17.9

## 2015-06-21 LAB — URINALYSIS, ROUTINE W REFLEX MICROSCOPIC
GLUCOSE, UA: NEGATIVE mg/dL
HGB URINE DIPSTICK: NEGATIVE
KETONES UR: NEGATIVE mg/dL
Leukocytes, UA: NEGATIVE
Nitrite: NEGATIVE
PROTEIN: NEGATIVE mg/dL
Specific Gravity, Urine: 1.016 (ref 1.005–1.030)
pH: 5 (ref 5.0–8.0)

## 2015-06-21 LAB — I-STAT CG4 LACTIC ACID, ED
LACTIC ACID, VENOUS: 1.13 mmol/L (ref 0.5–2.0)
Lactic Acid, Venous: 0.57 mmol/L (ref 0.5–2.0)

## 2015-06-21 LAB — BLOOD GAS, ARTERIAL
Acid-base deficit: 4.3 mmol/L — ABNORMAL HIGH (ref 0.0–2.0)
BICARBONATE: 19.3 meq/L — AB (ref 20.0–24.0)
Drawn by: 257701
FIO2: 0.21
O2 SAT: 95.6 %
PATIENT TEMPERATURE: 98.6
PH ART: 7.401 (ref 7.350–7.450)
PO2 ART: 83.5 mmHg (ref 80.0–100.0)
TCO2: 18 mmol/L (ref 0–100)
pCO2 arterial: 31.7 mmHg — ABNORMAL LOW (ref 35.0–45.0)

## 2015-06-21 LAB — C DIFFICILE QUICK SCREEN W PCR REFLEX
C DIFFICILE (CDIFF) INTERP: NEGATIVE
C DIFFICILE (CDIFF) TOXIN: NEGATIVE
C DIFFICLE (CDIFF) ANTIGEN: NEGATIVE

## 2015-06-21 LAB — CBC
HEMATOCRIT: 26 % — AB (ref 36.0–46.0)
HEMOGLOBIN: 8.8 g/dL — AB (ref 12.0–15.0)
MCH: 28.4 pg (ref 26.0–34.0)
MCHC: 33.8 g/dL (ref 30.0–36.0)
MCV: 83.9 fL (ref 78.0–100.0)
Platelets: 370 10*3/uL (ref 150–400)
RBC: 3.1 MIL/uL — AB (ref 3.87–5.11)
RDW: 15.2 % (ref 11.5–15.5)
WBC: 11.1 10*3/uL — AB (ref 4.0–10.5)

## 2015-06-21 LAB — CBC WITH DIFFERENTIAL/PLATELET
Basophils Absolute: 0 10*3/uL (ref 0.0–0.1)
Basophils Relative: 0 %
EOS PCT: 0 %
Eosinophils Absolute: 0 10*3/uL (ref 0.0–0.7)
HCT: 30.3 % — ABNORMAL LOW (ref 36.0–46.0)
Hemoglobin: 9.7 g/dL — ABNORMAL LOW (ref 12.0–15.0)
LYMPHS ABS: 0.7 10*3/uL (ref 0.7–4.0)
LYMPHS PCT: 6 %
MCH: 27.3 pg (ref 26.0–34.0)
MCHC: 32 g/dL (ref 30.0–36.0)
MCV: 85.4 fL (ref 78.0–100.0)
MONOS PCT: 7 %
Monocytes Absolute: 0.8 10*3/uL (ref 0.1–1.0)
Neutro Abs: 11.1 10*3/uL — ABNORMAL HIGH (ref 1.7–7.7)
Neutrophils Relative %: 87 %
PLATELETS: 522 10*3/uL — AB (ref 150–400)
RBC: 3.55 MIL/uL — AB (ref 3.87–5.11)
RDW: 15.2 % (ref 11.5–15.5)
WBC: 12.7 10*3/uL — AB (ref 4.0–10.5)

## 2015-06-21 LAB — TSH: TSH: 0.102 u[IU]/mL — AB (ref 0.350–4.500)

## 2015-06-21 LAB — T4, FREE: Free T4: 1.35 ng/dL — ABNORMAL HIGH (ref 0.61–1.12)

## 2015-06-21 LAB — COMPREHENSIVE METABOLIC PANEL
ALK PHOS: 119 U/L (ref 38–126)
ALT: 22 U/L (ref 14–54)
ANION GAP: 16 — AB (ref 5–15)
AST: 16 U/L (ref 15–41)
Albumin: 3.6 g/dL (ref 3.5–5.0)
BUN: 35 mg/dL — ABNORMAL HIGH (ref 6–20)
CALCIUM: 9 mg/dL (ref 8.9–10.3)
CHLORIDE: 95 mmol/L — AB (ref 101–111)
CO2: 23 mmol/L (ref 22–32)
CREATININE: 5.91 mg/dL — AB (ref 0.44–1.00)
GFR, EST AFRICAN AMERICAN: 8 mL/min — AB (ref >60)
GFR, EST NON AFRICAN AMERICAN: 7 mL/min — AB (ref >60)
Glucose, Bld: 120 mg/dL — ABNORMAL HIGH (ref 65–99)
Potassium: 3.5 mmol/L (ref 3.5–5.1)
Sodium: 134 mmol/L — ABNORMAL LOW (ref 135–145)
Total Bilirubin: 1.2 mg/dL (ref 0.3–1.2)
Total Protein: 7.8 g/dL (ref 6.5–8.1)

## 2015-06-21 LAB — MAGNESIUM: Magnesium: 1.7 mg/dL (ref 1.7–2.4)

## 2015-06-21 LAB — LIPASE, BLOOD: LIPASE: 85 U/L — AB (ref 11–51)

## 2015-06-21 MED ORDER — ONDANSETRON HCL 4 MG/2ML IJ SOLN
4.0000 mg | Freq: Four times a day (QID) | INTRAMUSCULAR | Status: DC | PRN
  Administered 2015-06-26 – 2015-07-04 (×4): 4 mg via INTRAVENOUS
  Filled 2015-06-21 (×6): qty 2

## 2015-06-21 MED ORDER — FERROUS SULFATE 325 (65 FE) MG PO TABS
325.0000 mg | ORAL_TABLET | Freq: Every day | ORAL | Status: DC
  Administered 2015-06-21 – 2015-06-23 (×3): 325 mg via ORAL
  Filled 2015-06-21 (×4): qty 1

## 2015-06-21 MED ORDER — LACTATED RINGERS IV BOLUS (SEPSIS): 1000.0000 mL | Freq: Three times a day (TID) | INTRAVENOUS | Status: AC | PRN

## 2015-06-21 MED ORDER — TRAMADOL HCL 50 MG PO TABS
50.0000 mg | ORAL_TABLET | Freq: Four times a day (QID) | ORAL | Status: DC | PRN
  Administered 2015-06-23 – 2015-06-24 (×2): 50 mg via ORAL
  Administered 2015-06-25 – 2015-07-06 (×9): 100 mg via ORAL
  Filled 2015-06-21 (×10): qty 2
  Filled 2015-06-21 (×2): qty 1

## 2015-06-21 MED ORDER — OXYCODONE HCL 5 MG PO TABS: 5.0000 mg | ORAL_TABLET | ORAL | Status: DC | PRN

## 2015-06-21 MED ORDER — HEPARIN SODIUM (PORCINE) 5000 UNIT/ML IJ SOLN
5000.0000 [IU] | Freq: Three times a day (TID) | INTRAMUSCULAR | Status: DC
  Filled 2015-06-21 (×5): qty 1

## 2015-06-21 MED ORDER — ASPIRIN 81 MG PO CHEW
81.0000 mg | CHEWABLE_TABLET | Freq: Every day | ORAL | Status: DC
  Administered 2015-06-22 – 2015-07-05 (×15): 81 mg via ORAL
  Filled 2015-06-21 (×17): qty 1

## 2015-06-21 MED ORDER — LIP MEDEX EX OINT
1.0000 "application " | TOPICAL_OINTMENT | Freq: Two times a day (BID) | CUTANEOUS | Status: DC
  Administered 2015-06-22 – 2015-07-06 (×27): 1 via TOPICAL
  Filled 2015-06-21 (×3): qty 7

## 2015-06-21 MED ORDER — DIPHENHYDRAMINE HCL 50 MG/ML IJ SOLN: 12.5000 mg | Freq: Four times a day (QID) | INTRAMUSCULAR | Status: DC | PRN

## 2015-06-21 MED ORDER — ONDANSETRON HCL 4 MG PO TABS: 4.0000 mg | ORAL_TABLET | Freq: Four times a day (QID) | ORAL | Status: DC | PRN

## 2015-06-21 MED ORDER — SODIUM CHLORIDE 0.9 % IV SOLN
INTRAVENOUS | Status: DC
  Administered 2015-06-21 – 2015-06-24 (×6): via INTRAVENOUS

## 2015-06-21 MED ORDER — LEVALBUTEROL HCL 0.63 MG/3ML IN NEBU: 0.6300 mg | INHALATION_SOLUTION | Freq: Four times a day (QID) | RESPIRATORY_TRACT | Status: DC | PRN

## 2015-06-21 MED ORDER — SODIUM CHLORIDE 0.9 % IV BOLUS (SEPSIS)
500.0000 mL | Freq: Once | INTRAVENOUS | Status: AC
  Administered 2015-06-21: 500 mL via INTRAVENOUS

## 2015-06-21 MED ORDER — DEXTROSE-NACL 5-0.45 % IV SOLN
INTRAVENOUS | Status: DC
  Administered 2015-06-21: 11:00:00 via INTRAVENOUS

## 2015-06-21 MED ORDER — ONDANSETRON HCL 4 MG/2ML IJ SOLN: 4.0000 mg | Freq: Four times a day (QID) | INTRAMUSCULAR | Status: DC | PRN

## 2015-06-21 MED ORDER — LORAZEPAM 2 MG/ML IJ SOLN
0.5000 mg | Freq: Three times a day (TID) | INTRAMUSCULAR | Status: DC | PRN
Start: 2015-06-21 — End: 2015-06-21

## 2015-06-21 MED ORDER — SODIUM CHLORIDE 0.9 % IV BOLUS (SEPSIS)
1000.0000 mL | Freq: Once | INTRAVENOUS | Status: AC
Start: 2015-06-21 — End: 2015-06-21
  Administered 2015-06-21: 1000 mL via INTRAVENOUS

## 2015-06-21 MED ORDER — PROCHLORPERAZINE MALEATE 10 MG PO TABS
5.0000 mg | ORAL_TABLET | Freq: Four times a day (QID) | ORAL | Status: DC | PRN
  Filled 2015-06-21: qty 1

## 2015-06-21 MED ORDER — HYDROMORPHONE HCL 1 MG/ML IJ SOLN: 0.5000 mg | INTRAMUSCULAR | Status: DC | PRN

## 2015-06-21 MED ORDER — LACTATED RINGERS IV BOLUS (SEPSIS)
1000.0000 mL | Freq: Once | INTRAVENOUS | Status: AC
  Administered 2015-06-21: 1000 mL via INTRAVENOUS

## 2015-06-21 MED ORDER — ALUM & MAG HYDROXIDE-SIMETH 200-200-20 MG/5ML PO SUSP
30.0000 mL | Freq: Four times a day (QID) | ORAL | Status: DC | PRN
  Administered 2015-06-22 – 2015-06-25 (×4): 30 mL via ORAL
  Filled 2015-06-21 (×4): qty 30

## 2015-06-21 MED ORDER — LOPERAMIDE HCL 2 MG PO CAPS: 2.0000 mg | ORAL_CAPSULE | Freq: Every day | ORAL | Status: DC

## 2015-06-21 MED ORDER — MENTHOL 3 MG MT LOZG
1.0000 | LOZENGE | OROMUCOSAL | Status: DC | PRN
  Filled 2015-06-21: qty 9

## 2015-06-21 MED ORDER — METOCLOPRAMIDE HCL 5 MG/ML IJ SOLN
5.0000 mg | Freq: Four times a day (QID) | INTRAMUSCULAR | Status: DC | PRN
  Administered 2015-06-23 – 2015-06-26 (×2): 10 mg via INTRAVENOUS
  Filled 2015-06-21 (×3): qty 2

## 2015-06-21 MED ORDER — LOPERAMIDE HCL 2 MG PO CAPS
2.0000 mg | ORAL_CAPSULE | Freq: Once | ORAL | Status: AC
  Administered 2015-06-21: 2 mg via ORAL
  Filled 2015-06-21: qty 1

## 2015-06-21 MED ORDER — MAGIC MOUTHWASH
15.0000 mL | Freq: Four times a day (QID) | ORAL | Status: DC | PRN
  Filled 2015-06-21: qty 15

## 2015-06-21 MED ORDER — PHENOL 1.4 % MT LIQD
2.0000 | OROMUCOSAL | Status: DC | PRN
  Filled 2015-06-21: qty 177

## 2015-06-21 MED ORDER — SODIUM CHLORIDE 0.9% FLUSH
3.0000 mL | Freq: Two times a day (BID) | INTRAVENOUS | Status: DC
  Administered 2015-06-22 – 2015-06-25 (×3): 3 mL via INTRAVENOUS

## 2015-06-21 MED ORDER — SODIUM CHLORIDE 0.9 % IV BOLUS (SEPSIS)
1000.0000 mL | Freq: Once | INTRAVENOUS | Status: AC
  Administered 2015-06-21: 1000 mL via INTRAVENOUS

## 2015-06-21 MED ORDER — LOPERAMIDE HCL 2 MG PO CAPS: 2.0000 mg | ORAL_CAPSULE | Freq: Three times a day (TID) | ORAL | Status: DC | PRN

## 2015-06-21 MED ORDER — LORAZEPAM 2 MG/ML IJ SOLN
0.5000 mg | Freq: Three times a day (TID) | INTRAMUSCULAR | Status: DC | PRN
  Administered 2015-06-29: 1 mg via INTRAVENOUS
  Filled 2015-06-21: qty 1

## 2015-06-21 MED ORDER — SODIUM CHLORIDE 0.9 % IV SOLN
8.0000 mg | Freq: Four times a day (QID) | INTRAVENOUS | Status: DC | PRN
  Filled 2015-06-21: qty 4

## 2015-06-21 MED ORDER — BOOST PLUS PO LIQD
237.0000 mL | Freq: Two times a day (BID) | ORAL | Status: DC
  Administered 2015-06-21: 237 mL via ORAL
  Filled 2015-06-21 (×3): qty 237

## 2015-06-21 MED ORDER — ONDANSETRON HCL 4 MG/2ML IJ SOLN
4.0000 mg | Freq: Once | INTRAMUSCULAR | Status: AC
  Administered 2015-06-21: 4 mg via INTRAVENOUS
  Filled 2015-06-21: qty 2

## 2015-06-21 MED ORDER — IOHEXOL 300 MG/ML  SOLN
50.0000 mL | Freq: Once | INTRAMUSCULAR | Status: AC | PRN
  Administered 2015-06-21: 50 mL via ORAL

## 2015-06-21 NOTE — ED Notes (Signed)
AWARE OF NEED FOR URINE 

## 2015-06-21 NOTE — ED Notes (Signed)
DR GROSS PA PRESENT TO SPEAK WITH PT

## 2015-06-21 NOTE — ED Notes (Signed)
MD at bedside. 

## 2015-06-21 NOTE — ED Notes (Signed)
MD at bedside. ADMISSION MD PRESENT 

## 2015-06-21 NOTE — Consult Note (Signed)
WOC ostomy consult note Stoma type/location: RLQ loop ileostomy Stomal assessment/size: 1 and 1/8 inch.  Red, budded, moist. Peristomal assessment: mucocutaneous separation from 1-4 o'clock; wound bed is covered with black, stringy slough. Treatment options for stomal/peristomal skin: Cover parastomal defect with two "strips" of pectin barrier.  Place skin barrier ring around stoma. Pouch on top of ostomy with convex pouch cut to fit only stoma. Output: Thin, light brown effluent with mucus segments. Ostomy pouching: 1pc.convex ostomy pouching system with skin barrier ring; 1 other ring, cut in half and used as "strips" used to cover parastomal defect.  Pouch applied at a slight angle as she is currently in bed and at risk for overfilling and subsequent pouch leakage. Education provided: None.  Patient is sleeping and sister and family report that she has been up for two days with 8 pouch changes and emesis. Enrolled patient in [place] program: Yes, previous admission.  [place] nursing team will follow, and will remain available to this patient, the nursing, surgical and medical teams.   Thanks, Maudie Flakes, MSN, RN, [place], Arther Abbott  Pager# [phone]

## 2015-06-21 NOTE — Consult Note (Signed)
WOC ostomy consult note Second request today to see patient.  I saw earlier today for pouching system change and noted mucocutaneous separation.  Please see note from 11:22am today. [place] nursing team will follow if admitted, and will remain available to this patient, the nursing, surgical and medical teams.  Thanks, Maudie Flakes, MSN, RN, [place], Arther Abbott  Pager# [phone]

## 2015-06-21 NOTE — Progress Notes (Signed)
Patient ID: Brianna Walker, female   DOB: 05-03-1945, 70 y.o.   MRN: [id number]     [facility]      [address] [place], [place] [address]    Phone: [phone] FAX: [phone]     Subjective: Brianna Walker is a 70 year old female s/p laparoscopic and robotic assisted LOA x3 hours, LAR, diverting loop ileostomy, resection of GIST tumor on 2/317 by Dr. Johney Maine.  She was discharged on 06/18/15 with home health for IVF administration.  The patient states the nurse came out today and discussed administering fluids, but has not received any prior to this time.  She endorses to nausea and vomiting, bilious.  High output ileostomy.  Appetite is poor. Reports fevers.  Work up shows, WBC 11.1k, sCr 5.91, afebrile, BP soft but stable.  CT of A/P questions pSBO or an ileus, although ostomy is functioning otherwise normal.    Off note, she has not been taking her home medication including Gleevec for positive margins on recent surgery and Eliquis for history of DVT/PE  Objective:  Vital signs:  Filed Vitals:   06/21/15 1000 06/21/15 1130 06/21/15 1200 06/21/15 1300  BP: 113/59 101/61 110/63 100/62  Pulse: 89 98 103 96  Temp:    99 F (37.2 C)  TempSrc:    Rectal  Resp: '16 21 20 23  ' SpO2: 93% 94% 99% 100%       Intake/Output   Yesterday:    This shift:  Total I/O In: 2700 [P.O.:500; I.V.:1600; Other:600] Out: -    Physical Exam: General: Pt awake/alert/oriented x4 in no acute distress  Chest: cta. No chest wall pain w good excursion CV:  Pulses intact.  Regular rhythm Abdomen: Soft.  Nondistended.  Non tender.  Incisions ar healing well.  rlq ostomy is functioning.  No evidence of peritonitis.  No incarcerated hernias.    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    Results:   Labs: Results for orders placed or performed during the hospital encounter of 06/21/15 (from the past 48 hour(s))  CBC with Differential     Status: Abnormal   Collection Time: 06/21/15  7:41 AM  Result Value Ref Range   WBC 12.7 (H) 4.0 - 10.5 K/uL   RBC 3.55 (L) 3.87 - 5.11 MIL/uL   Hemoglobin 9.7 (L) 12.0 - 15.0 g/dL   HCT 30.3 (L) 36.0 - 46.0 %   MCV 85.4 78.0 - 100.0 fL   MCH 27.3 26.0 - 34.0 pg   MCHC 32.0 30.0 - 36.0 g/dL   RDW 15.2 11.5 - 15.5 %   Platelets 522 (H) 150 - 400 K/uL   Neutrophils Relative % 87 %   Neutro Abs 11.1 (H) 1.7 - 7.7 K/uL   Lymphocytes Relative 6 %   Lymphs Abs 0.7 0.7 - 4.0 K/uL   Monocytes Relative 7 %   Monocytes Absolute 0.8 0.1 - 1.0 K/uL   Eosinophils Relative 0 %   Eosinophils Absolute 0.0 0.0 - 0.7 K/uL   Basophils Relative 0 %   Basophils Absolute 0.0 0.0 - 0.1 K/uL  Comprehensive metabolic panel     Status: Abnormal   Collection Time: 06/21/15  7:41 AM  Result Value Ref Range   Sodium 134 (L) 135 - 145  mmol/L   Potassium 3.5 3.5 - 5.1 mmol/L   Chloride 95 (L) 101 - 111 mmol/L   CO2 23 22 - 32 mmol/L   Glucose, Bld 120 (H) 65 - 99 mg/dL   BUN 35 (H) 6 - 20 mg/dL   Creatinine, Ser 5.91 (H) 0.44 - 1.00 mg/dL   Calcium 9.0 8.9 - 10.3 mg/dL   Total Protein 7.8 6.5 - 8.1 g/dL   Albumin 3.6 3.5 - 5.0 g/dL   AST 16 15 - 41 U/L   ALT 22 14 - 54 U/L   Alkaline Phosphatase 119 38 - 126 U/L   Total Bilirubin 1.2 0.3 - 1.2 mg/dL   GFR calc non Af Amer 7 (L) >60 mL/min   GFR calc Af Amer 8 (L) >60 mL/min    Comment: (NOTE) The eGFR has been calculated using the CKD EPI equation. This calculation has not been validated in all clinical situations. eGFR's persistently <60 mL/min signify possible Chronic Kidney Disease.    Anion gap 16 (H) 5 - 15  Lipase, blood     Status: Abnormal   Collection Time: 06/21/15  7:41 AM  Result Value Ref Range   Lipase 85 (H) 11 - 51 U/L  I-Stat CG4 Lactic Acid, ED      Status: None   Collection Time: 06/21/15  8:00 AM  Result Value Ref Range   Lactic Acid, Venous 1.13 0.5 - 2.0 mmol/L  I-Stat CG4 Lactic Acid, ED     Status: None   Collection Time: 06/21/15 11:08 AM  Result Value Ref Range   Lactic Acid, Venous 0.57 0.5 - 2.0 mmol/L  CBC     Status: Abnormal   Collection Time: 06/21/15 12:39 PM  Result Value Ref Range   WBC 11.1 (H) 4.0 - 10.5 K/uL   RBC 3.10 (L) 3.87 - 5.11 MIL/uL   Hemoglobin 8.8 (L) 12.0 - 15.0 g/dL   HCT 26.0 (L) 36.0 - 46.0 %   MCV 83.9 78.0 - 100.0 fL   MCH 28.4 26.0 - 34.0 pg   MCHC 33.8 30.0 - 36.0 g/dL   RDW 15.2 11.5 - 15.5 %   Platelets 370 150 - 400 K/uL  Magnesium     Status: None   Collection Time: 06/21/15 12:39 PM  Result Value Ref Range   Magnesium 1.7 1.7 - 2.4 mg/dL    Imaging / Studies: Ct Abdomen Pelvis Wo Contrast  06/21/2015  CLINICAL DATA:  Nausea and vomiting for the past 4 days. Status post GIST tumor removal on 06/04/2015. EXAM: CT ABDOMEN AND PELVIS WITHOUT CONTRAST TECHNIQUE: Multidetector CT imaging of the abdomen and pelvis was performed following the standard protocol without IV contrast. COMPARISON:  06/17/2015. FINDINGS: Lower chest: 3 mm left lower lobe nodule on image number 6. This has not changed significantly since 07/22/2014 and was not included on other previous examinations. Hepatobiliary: Cholecystectomy clips.  Unremarkable liver. Pancreas: No mass or inflammatory process identified on this un-enhanced exam. Spleen: Within normal limits in size. Adrenals/Urinary Tract: No evidence of urolithiasis or hydronephrosis. No definite mass visualized on this un-enhanced exam. Stomach/Bowel: Right lower quadrant anterior ileostomy with passage of oral contrast into the ostomy bag. Decreased soft tissue stranding and fluid in the surrounding subcutaneous fat. Soft tissue stranding mower lateral to that location is unchanged. Bilateral subcutaneous air is again demonstrated. Mildly prominent loops of  jejunum with improvement. This transitions to normal caliber small bowel in the left mid abdomen, best seen on coronal image number 36.  Small hiatal hernia. Scattered colonic diverticula. No evidence of appendicitis. Vascular/Lymphatic: Atheromatous coronary artery calcifications. No enlarged lymph nodes. Reproductive: Surgically absent uterus.  No adnexal masses. Other: Mild perirectal and presacral soft tissue thickening and edema and soft tissue air. Minimal additional free peritoneal air. Stable large postoperative seroma in the anterior pelvic wall, with some soft tissue density components. This measures 13.1 x 7.9 cm on image number 63. Musculoskeletal: Right hip total prosthesis. Left iliac bone island. Lumbar and lower thoracic spine degenerative changes and mild scoliosis IMPRESSION: 1. Stable postsurgical changes, as described above. 2. Mildly improved postsurgical jejunal ileus or partial obstruction, with transition in the left mid abdomen. This could be an indication of an adhesion. 3. Small hiatal hernia. 4. Colonic diverticulosis. Electronically Signed   By: Claudie Revering M.D.   On: 06/21/2015 11:19   Dg Chest Port 1 View  06/21/2015  CLINICAL DATA:  N/V today; Pt recently discharged on 2/17 after a gastrointestinal stroma tumor of distal rectum removal.; HTN; hx PE EXAM: PORTABLE CHEST - 1 VIEW COMPARISON:  07/13/2015 FINDINGS: Lungs are clear. Heart size and mediastinal contours are within normal limits. No effusion. Visualized skeletal structures are unremarkable. Stable right arm PICC, tip low SVC. Surgical clips, right upper abdomen. IMPRESSION: No acute cardiopulmonary disease. Electronically Signed   By: Lucrezia Europe M.D.   On: 06/21/2015 12:43    Medications / Allergies:  Scheduled Meds: . heparin  5,000 Units Subcutaneous 3 times per day  . sodium chloride flush  3 mL Intravenous Q12H   Continuous Infusions: . sodium chloride 125 mL/hr at 06/21/15 1243   PRN Meds:.HYDROmorphone  (DILAUDID) injection, levalbuterol, ondansetron **OR** ondansetron (ZOFRAN) IV, oxyCODONE  Antibiotics: Anti-infectives    None        Assessment/Plan s/p laparoscopic and robotic assisted LOA x3 hours, LAR, diverting loop ileostomy, resection of GIST tumor on 2/317 by Dr. Johney Maine Nausea/vomiting CT questions pSBO v an ileus, but ostomy is functioning.   Diet as tolerated  Positive margins-->gleevec WOC for ostomy care High ileostomy output-hydration.  Given recent hospitalization and pre op atbx, probably should rule out c diff.  If negative, may benefit from fiber/Imodium to help slow down output and prevent dehydration AKI-prerenal, IVF.  Further management per primary team.  DVT/PE-may resume [place].   Erby Pian, [facility] Pager [phone](7A-4:30P)   06/21/2015 1:28 PM

## 2015-06-21 NOTE — ED Notes (Signed)
Pt's Ileostomy bag is leaking . Emptied bag into urinal. Pt cleaned and tape placed a leakage site temporarily with ABD pad under bag for extra protection. Marland Kitchen Pt placed in gown, pericare completed.

## 2015-06-21 NOTE — ED Notes (Signed)
Patient transported to CT 

## 2015-06-21 NOTE — ED Notes (Signed)
ATTEMPT X 2 WITH FEMALE URINAL. NOT SUCCESSFUL. EDP SCHLASSMAN MADE AWARE. VERBAL ORDER FOR INSERTION OF FOLEY CATH. VERIFIED BY JANET LAWSON.

## 2015-06-21 NOTE — ED Notes (Signed)
Bed: [facility] Expected date:  Expected time:  Means of arrival:  Comments: EMS [name] Ca pt / N/V; new colostomy

## 2015-06-21 NOTE — ED Notes (Signed)
RN said no to Orthostatic vitals due to pt condition, unable to stand

## 2015-06-21 NOTE — ED Notes (Addendum)
Admitting MD made aware of elevated HR, RR, low or soft BP. Low urine out put, bladder scanned, Current infused IVF and oral intake. Order for portable chest and to decrease current MIVF to 75cc/hr.

## 2015-06-21 NOTE — ED Notes (Signed)
Per EMS report: pt coming from home and presents today with N/V.  Pt recently discharged on 2/17 after a gastrointestinal stroma tumor of distal rectum removal.  Pt currently has a temporary colostomy. Pt is unable to keep any fluids down.  On EMS arrival, pt was hypotensive.  Pt was laid down and vomited about 1,000 ml of emesis and now has no more nausea. EMS has given pt 546ml of fluids.  EMS VS: BP: 137/81, HR: 118, RR: 18, CBG: 169

## 2015-06-21 NOTE — ED Notes (Signed)
OSTOMY RN PRENT TO EVALUATE THIS PT AND SPEAKING WITH PT AND FAMILY

## 2015-06-21 NOTE — ED Notes (Signed)
Pt ileostomy bag emptied

## 2015-06-21 NOTE — ED Provider Notes (Signed)
CSN: [id number]     Arrival date & time 06/21/15  0650 History   First MD Initiated Contact with Patient 06/21/15 0703     Chief Complaint  Patient presents with  . Emesis     (Consider location/radiation/quality/duration/timing/severity/associated sxs/prior Treatment) HPI Comments: N/v after surgery, had ileus requiring ng tube Thurs went home, did not have emesis thurs-Friday, developed nausea Saturday, vomiting Sunday night Vomited, greenish color (similar to prior emesis while in hospital) 5-6 times Ostomy output a lot, however unchanged Abdominal pain started lastnight Feels like pulling around the ostomy 7/10 No fever Tried nausea pill no relief No urinary symptoms  Patient is a 70 y.o. female presenting with vomiting.  Emesis Associated symptoms: abdominal pain and diarrhea (high ostomy output)   Associated symptoms: no headaches and no sore throat     Past Medical History  Diagnosis Date  . ALLERGIC RHINITIS 09/12/2007  . BACK PAIN 06/19/2008  . Cramp of limb 08/11/2009  . Long term (current) use of anticoagulants 11/29/2010  . FREQUENCY, URINARY 10/07/2009  . GERD 12/05/2006  . HEMORRHOIDS 11/17/2006  . HIP PAIN, RIGHT, CHRONIC 08/11/2009  . HX, PERSONAL, TUBERCULOSIS 11/17/2006  . HYPERLIPIDEMIA 09/12/2007  . HYPERTENSION 11/17/2006  . INSOMNIA-SLEEP DISORDER-UNSPEC 06/19/2008  . LEG PAIN, RIGHT 06/19/2008  . MASS, SUPERFICIAL 09/12/2007  . MENOPAUSAL DISORDER 09/20/2009  . OVERACTIVE BLADDER 03/04/2010  . PULMONARY EMBOLISM, HX OF 11/17/2006    [place] [facility] s/p Pneumonia  . RECTAL BLEEDING 10/07/2007  . SCIATICA, RIGHT 09/12/2007  . SKIN LESION 07/20/2009  . TMJ SYNDROME 11/17/2006  . Iron deficiency anemia 12/07/2011  . Diverticulosis   . Hyperlipidemia 09/12/2007    Qualifier: Diagnosis of  By: Jenny Reichmann MD, Hunt Oris   . Arthritis   . Colon polyps   . DEEP VENOUS THROMBOPHLEBITIS, LEG, RIGHT 03/04/2010    right leg  . Pneumonia   . Rectal mass 06/2014  . Cancer  ([place])     dx. 4'16 -oral chemotherapy only.  . Shortness of breath dyspnea     With exertion since chemotherapy tx-oral meds  . Tuberculosis     pt. was tx.   Past Surgical History  Procedure Laterality Date  . Cholecystectomy open    . Abdominal hysterectomy    . Incisional hernia repair  2002      multiple ventral hernia repair/mesh  . Tumor removed off of right shoulder Right   . S/p right hip replaced min invasive hip surgury [facility] oct 2011 Right 2011  . Hemorroid surgery  removed at dr Silvio Pate office    x 2  . Eus N/A 08/06/2014    Procedure: LOWER ENDOSCOPIC ULTRASOUND (EUS);  Surgeon: Milus Banister, MD;  Location: [facility];  Service: Endoscopy;  Laterality: N/A;  . Incisional hernia repair  01/13/2005    open w onlay Proceed mesh.  Mechele Claude robotic assisted lower anterior resection N/A 06/04/2015    Procedure: XI ROBOTIC ASSISTED LYSIS OF ADHESIONS, LOWER ANTERIOR RESECTION TRANS ABDOMINAL AND TRANS ANAL, COLOANAL HANDSEWN ANASTAOSIS, DIVERTING LOPE ILIOSTOMY, RESECTION GIST TUMOR;  Surgeon: Michael Boston, MD;  Location: [facility];  Service: General;  Laterality: N/A;  . Laparoscopic lysis of adhesions  06/04/2015    Procedure: LAPAROSCOPIC LYSIS OF ADHESIONS;  Surgeon: Michael Boston, MD;  Location: [facility];  Service: General;;   Family History  Problem Relation Age of Onset  . Colon cancer Father   . Heart disease Mother   . Heart disease Sister   .  Diabetes Sister    Social History  Substance Use Topics  . Smoking status: Former Smoker -- 0.00 packs/day    Types: Cigarettes    Quit date: 06/09/1977  . Smokeless tobacco: Never Used  . Alcohol Use: 0.0 oz/week    0 Standard drinks or equivalent per week     Comment: rare   OB History    No data available     Review of Systems  Constitutional: Negative for fever.  HENT: Negative for sore throat.   Eyes: Negative for visual disturbance.  Respiratory: Negative for cough and shortness of breath.   Cardiovascular:  Negative for chest pain.  Gastrointestinal: Positive for nausea, vomiting, abdominal pain and diarrhea (high ostomy output). Negative for constipation.  Genitourinary: Positive for decreased urine volume and vaginal bleeding (from surgery). Negative for dysuria, hematuria, vaginal discharge and difficulty urinating.  Musculoskeletal: Negative for back pain and neck pain.  Skin: Negative for rash.  Neurological: Negative for syncope and headaches.      Allergies  Review of patient's allergies indicates no known allergies.  Home Medications   Prior to Admission medications   Medication Sig Start Date End Date Taking? Authorizing Provider  apixaban (ELIQUIS) 5 MG TABS tablet Take 1 tablet (5 mg total) by mouth 2 (two) times daily. 09/04/14  Yes Biagio Borg, MD  aspirin 81 MG tablet Take 81 mg by mouth at bedtime.    Yes Historical Provider, MD  estradiol (VIVELLE-DOT) 0.025 MG/24HR APPLY 1 PATCH ONTO THE SKIN TWO TIMES WEEKLY Patient taking differently: APPLY 1 PATCH ONTO SKIN EVERY SUNDAY. 01/15/15  Yes Biagio Borg, MD  ferrous sulfate 325 (65 FE) MG tablet Take 1 tablet (325 mg total) by mouth daily. 09/04/14  Yes Biagio Borg, MD  omeprazole (PRILOSEC) 20 MG capsule Take 1 capsule (20 mg total) by mouth every evening. 09/04/14 09/04/15 Yes Biagio Borg, MD  pravastatin (PRAVACHOL) 20 MG tablet Take 1 tablet (20 mg total) by mouth daily. Patient taking differently: Take 20 mg by mouth at bedtime.  09/04/14  Yes Biagio Borg, MD  prochlorperazine (COMPAZINE) 5 MG tablet Take 1 tablet (5 mg total) by mouth every 6 (six) hours as needed for nausea or vomiting. 02/04/15  Yes Owens Shark, NP  traMADol (ULTRAM) 50 MG tablet Take 1 tablet (50 mg total) by mouth at bedtime as needed. Patient taking differently: Take 50 mg by mouth at bedtime as needed for moderate pain.  10/01/14  Yes Aleksei Plotnikov V, MD  GLEEVEC 400 MG tablet TAKE 1 TABLET BY MOUTH ONCE DAILY WITH MEALS AND LARGE GLASS OF WATER 05/19/15    Ladell Pier, MD   BP 104/51 mmHg  Pulse 105  Temp(Src) 98.5 F (36.9 C) (Oral)  Resp 12  SpO2 98% Physical Exam  Constitutional: She is oriented to person, place, and time. She appears well-developed and well-nourished. She has a sickly appearance. She appears ill. No distress.  HENT:  Head: Normocephalic and atraumatic.  Dry oral mucosa  Eyes: Conjunctivae and EOM are normal.  Neck: Normal range of motion.  Cardiovascular: Normal rate, regular rhythm, normal heart sounds and intact distal pulses.  Exam reveals no gallop and no friction rub.   No murmur heard. Pulmonary/Chest: Effort normal and breath sounds normal. No respiratory distress. She has no wheezes. She has no rales.  Abdominal: Soft. She exhibits no distension. There is tenderness (LUQ, around ostomy). There is no guarding.  Musculoskeletal: She exhibits no edema or  tenderness.  Neurological: She is alert and oriented to person, place, and time.  Skin: Skin is warm and dry. No rash noted. She is not diaphoretic. No erythema.  Nursing note and vitals reviewed.   ED Course  Procedures (including critical care time) Labs Review Labs Reviewed  CBC WITH DIFFERENTIAL/PLATELET - Abnormal; Notable for the following:    WBC 12.7 (*)    RBC 3.55 (*)    Hemoglobin 9.7 (*)    HCT 30.3 (*)    Platelets 522 (*)    Neutro Abs 11.1 (*)    All other components within normal limits  COMPREHENSIVE METABOLIC PANEL - Abnormal; Notable for the following:    Sodium 134 (*)    Chloride 95 (*)    Glucose, Bld 120 (*)    BUN 35 (*)    Creatinine, Ser 5.91 (*)    GFR calc non Af Amer 7 (*)    GFR calc Af Amer 8 (*)    Anion gap 16 (*)    All other components within normal limits  LIPASE, BLOOD - Abnormal; Notable for the following:    Lipase 85 (*)    All other components within normal limits  URINALYSIS, ROUTINE W REFLEX MICROSCOPIC (NOT AT [facility]) - Abnormal; Notable for the following:    Color, Urine AMBER (*)     APPearance CLOUDY (*)    Bilirubin Urine MODERATE (*)    All other components within normal limits  CBC - Abnormal; Notable for the following:    WBC 11.1 (*)    RBC 3.10 (*)    Hemoglobin 8.8 (*)    HCT 26.0 (*)    All other components within normal limits  TSH - Abnormal; Notable for the following:    TSH 0.102 (*)    All other components within normal limits  BLOOD GAS, ARTERIAL - Abnormal; Notable for the following:    pCO2 arterial 31.7 (*)    Bicarbonate 19.3 (*)    Acid-base deficit 4.3 (*)    All other components within normal limits  URINE CULTURE  C DIFFICILE QUICK SCREEN W PCR REFLEX  GASTROINTESTINAL PANEL BY PCR, STOOL (REPLACES STOOL CULTURE)  MAGNESIUM  T4, FREE  COMPREHENSIVE METABOLIC PANEL  CBC  I-STAT CG4 LACTIC ACID, ED  I-STAT CG4 LACTIC ACID, ED    Imaging Review Ct Abdomen Pelvis Wo Contrast  06/21/2015  CLINICAL DATA:  Nausea and vomiting for the past 4 days. Status post GIST tumor removal on 06/04/2015. EXAM: CT ABDOMEN AND PELVIS WITHOUT CONTRAST TECHNIQUE: Multidetector CT imaging of the abdomen and pelvis was performed following the standard protocol without IV contrast. COMPARISON:  06/17/2015. FINDINGS: Lower chest: 3 mm left lower lobe nodule on image number 6. This has not changed significantly since 07/22/2014 and was not included on other previous examinations. Hepatobiliary: Cholecystectomy clips.  Unremarkable liver. Pancreas: No mass or inflammatory process identified on this un-enhanced exam. Spleen: Within normal limits in size. Adrenals/Urinary Tract: No evidence of urolithiasis or hydronephrosis. No definite mass visualized on this un-enhanced exam. Stomach/Bowel: Right lower quadrant anterior ileostomy with passage of oral contrast into the ostomy bag. Decreased soft tissue stranding and fluid in the surrounding subcutaneous fat. Soft tissue stranding mower lateral to that location is unchanged. Bilateral subcutaneous air is again  demonstrated. Mildly prominent loops of jejunum with improvement. This transitions to normal caliber small bowel in the left mid abdomen, best seen on coronal image number 36. Small hiatal hernia. Scattered colonic diverticula. No evidence  of appendicitis. Vascular/Lymphatic: Atheromatous coronary artery calcifications. No enlarged lymph nodes. Reproductive: Surgically absent uterus.  No adnexal masses. Other: Mild perirectal and presacral soft tissue thickening and edema and soft tissue air. Minimal additional free peritoneal air. Stable large postoperative seroma in the anterior pelvic wall, with some soft tissue density components. This measures 13.1 x 7.9 cm on image number 63. Musculoskeletal: Right hip total prosthesis. Left iliac bone island. Lumbar and lower thoracic spine degenerative changes and mild scoliosis IMPRESSION: 1. Stable postsurgical changes, as described above. 2. Mildly improved postsurgical jejunal ileus or partial obstruction, with transition in the left mid abdomen. This could be an indication of an adhesion. 3. Small hiatal hernia. 4. Colonic diverticulosis. Electronically Signed   By: Claudie Revering M.D.   On: 06/21/2015 11:19   Dg Chest Port 1 View  06/21/2015  CLINICAL DATA:  N/V today; Pt recently discharged on 2/17 after a gastrointestinal stroma tumor of distal rectum removal.; HTN; hx PE EXAM: PORTABLE CHEST - 1 VIEW COMPARISON:  07/13/2015 FINDINGS: Lungs are clear. Heart size and mediastinal contours are within normal limits. No effusion. Visualized skeletal structures are unremarkable. Stable right arm PICC, tip low SVC. Surgical clips, right upper abdomen. IMPRESSION: No acute cardiopulmonary disease. Electronically Signed   By: Lucrezia Europe M.D.   On: 06/21/2015 12:43   I have personally reviewed and evaluated these images and lab results as part of my medical decision-making.   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   Patient hypotensive on EMS arrival with blood pressure [id number] systolic, received [id number] mL of normal saline on arrival to the emergency department initial blood pressure is 90 systolic, shortly after followed by blood pressure 100. Patient does appear dehydrated on exam. Ordered a liter of fluid, Zofran for nausea.  CMP shows new AKI with Cr up to 5 from .9 3 days ago, normal K, normal bicarb. Lipase WNL. Urinalysis shows no sign of UTI.  Patient unable to urinate and catheter placed to follow I/Os and relieve possible obstruction. However, AKI likely secondary to dehydration. CT abdomen pelvis obtained showing no acute abnoralities.  Lactic acid WNL.  Blood pressures remain stable after IV hydration.  Consulted general surgery given patient's recent discharge from hospital following surgical procedure, and admitted to hospitalist for continued care of AKI, dehydration.       Gareth Morgan, MD 06/21/15 1739

## 2015-06-21 NOTE — ED Notes (Signed)
ISTAT lactic 0.57

## 2015-06-21 NOTE — ED Notes (Addendum)
CARMAX AT BS. PT REQUESTED

## 2015-06-21 NOTE — H&P (Addendum)
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 General surgery consulted due to recent surgery. CT abdomen pelvis shows postsurgical changes, postsurgical ileus vs partial small bowel obstruction, small hiatal hernia. Chest x-ray negative. Creatinine has increased from 0.98> 5.91. Patient being admitted to the hospitalist service for further evaluation of her dehydration and renal failure. Patient appears to be lethargic, somnolent, unable to provide any history. According to RN patient has sporadically been awake to speak in full sentences. Blood pressure in the 0000000 systolic after 2.5 L fluid boluses in the ER. Lactic acid is normal      Review of Systems:  Unable to obtain a complete review of systems    Past Medical History  Diagnosis Date  . ALLERGIC RHINITIS 09/12/2007  . BACK PAIN 06/19/2008  . Cramp of limb 08/11/2009  . Long term (current) use of anticoagulants 11/29/2010  . FREQUENCY, URINARY 10/07/2009  . GERD 12/05/2006  . HEMORRHOIDS 11/17/2006  . HIP PAIN, RIGHT, CHRONIC 08/11/2009  . HX, PERSONAL, TUBERCULOSIS 11/17/2006  . HYPERLIPIDEMIA 09/12/2007  . HYPERTENSION 11/17/2006  . INSOMNIA-SLEEP DISORDER-UNSPEC 06/19/2008  . LEG PAIN, RIGHT 06/19/2008  . MASS, SUPERFICIAL 09/12/2007  . MENOPAUSAL DISORDER 09/20/2009  . OVERACTIVE BLADDER 03/04/2010  . PULMONARY EMBOLISM, HX OF 11/17/2006    [place] [facility] s/p Pneumonia  . RECTAL BLEEDING 10/07/2007  . SCIATICA, RIGHT 09/12/2007  . SKIN LESION 07/20/2009  . TMJ SYNDROME 11/17/2006  . Iron deficiency anemia 12/07/2011  .  Diverticulosis   . Hyperlipidemia 09/12/2007    Qualifier: Diagnosis of  By: Jenny Reichmann MD, Hunt Oris   . Arthritis   . Colon polyps   . DEEP VENOUS THROMBOPHLEBITIS, LEG, RIGHT 03/04/2010    right leg  . Pneumonia   . Rectal mass 06/2014  . Cancer ([place])     dx. 4'16 -oral chemotherapy only.  . Shortness of breath dyspnea     With exertion since chemotherapy tx-oral meds  . Tuberculosis     pt. was tx.     Past Surgical History  Procedure Laterality Date  . Cholecystectomy open    . Abdominal hysterectomy    . Incisional hernia repair  2002      multiple ventral hernia repair/mesh  . Tumor removed off of right shoulder Right   . S/p right hip replaced min invasive hip surgury [facility] oct 2011 Right 2011  . Hemorroid surgery  removed at dr Silvio Pate office    x 2  . Eus N/A 08/06/2014    Procedure: LOWER ENDOSCOPIC ULTRASOUND (EUS);  Surgeon: Milus Banister, MD;  Location: [facility];  Service: Endoscopy;  Laterality: N/A;  . Incisional hernia repair  01/13/2005    open w onlay Proceed mesh.  Mechele Claude robotic assisted lower anterior resection N/A 06/04/2015    Procedure: XI ROBOTIC ASSISTED LYSIS OF ADHESIONS, LOWER ANTERIOR RESECTION TRANS ABDOMINAL AND TRANS ANAL, COLOANAL HANDSEWN ANASTAOSIS, DIVERTING LOPE ILIOSTOMY, RESECTION GIST TUMOR;  Surgeon: Michael Boston, MD;  Location: [facility];  Service: General;  Laterality: N/A;  . Laparoscopic lysis of adhesions  06/04/2015    Procedure: LAPAROSCOPIC LYSIS OF ADHESIONS;  Surgeon:  Michael Boston, MD;  Location: [facility];  Service: General;;      Social History:  reports that she quit smoking about 38 years ago. Her smoking use included Cigarettes. She smoked 0.00 packs per day. She has never used smokeless tobacco. She reports that she drinks alcohol. She reports that she does not use illicit drugs.   No Known Allergies  Family History  Problem Relation Age of Onset  . Colon cancer Father   . Heart disease Mother   . Heart disease Sister   .  Diabetes Sister        Prior to Admission medications   Medication Sig Start Date End Date Taking? Authorizing Provider  apixaban (ELIQUIS) 5 MG TABS tablet Take 1 tablet (5 mg total) by mouth 2 (two) times daily. 09/04/14  Yes Biagio Borg, MD  aspirin 81 MG tablet Take 81 mg by mouth at bedtime.    Yes Historical Provider, MD  benazepril (LOTENSIN) 40 MG tablet Take 1 tablet (40 mg total) by mouth every evening. 09/04/14  Yes Biagio Borg, MD  estradiol (VIVELLE-DOT) 0.025 MG/24HR APPLY 1 PATCH ONTO THE SKIN TWO TIMES WEEKLY Patient taking differently: APPLY 1 PATCH ONTO SKIN EVERY SUNDAY. 01/15/15  Yes Biagio Borg, MD  ferrous sulfate 325 (65 FE) MG tablet Take 1 tablet (325 mg total) by mouth daily. 09/04/14  Yes Biagio Borg, MD  omeprazole (PRILOSEC) 20 MG capsule Take 1 capsule (20 mg total) by mouth every evening. 09/04/14 09/04/15 Yes Biagio Borg, MD  pravastatin (PRAVACHOL) 20 MG tablet Take 1 tablet (20 mg total) by mouth daily. Patient taking differently: Take 20 mg by mouth at bedtime.  09/04/14  Yes Biagio Borg, MD  prochlorperazine (COMPAZINE) 5 MG tablet Take 1 tablet (5 mg total) by mouth every 6 (six) hours as needed for nausea or vomiting. 02/04/15  Yes Owens Shark, NP  traMADol (ULTRAM) 50 MG tablet Take 1 tablet (50 mg total) by mouth at bedtime as needed. Patient taking differently: Take 50 mg by mouth at bedtime as needed for moderate pain.  10/01/14  Yes Aleksei Plotnikov V, MD  GLEEVEC 400 MG tablet TAKE 1 TABLET BY MOUTH ONCE DAILY WITH MEALS AND LARGE GLASS OF WATER 05/19/15   Ladell Pier, MD     Physical Exam: Filed Vitals:   06/21/15 1200 06/21/15 1300 06/21/15 1330 06/21/15 1406  BP: 110/63 100/62 97/53 96/59   Pulse: 103 96 89 95  Temp:  99 F (37.2 C)    TempSrc:  Rectal    Resp: 20 23 18 18   SpO2: 99% 100% 97% 95%     Constitutional: Vital signs reviewed. Patient is  chronically ill-appearing, lethargic, arousable but very somnolentd: Normocephalic and  atraumatic  Ear: TM normal bilaterally  Mouth: no erythema or exudates,dry oral mucous membranes  Eyes: PERRL, EOMI, conjunctivae normal, No scleral icterus.  Neck: Supple, Trachea midline normal ROM, No JVD, mass, thyromegaly, or carotid bruit present.  Cardiovascular: RRR, S1 normal, S2 normal, no MRG, pulses symmetric and intact bilaterally  Pulmonary/Chest: CTAB, no wheezes, rales, or rhonchi  Abdomen: Soft. Nondistended. Non tender. Incisions ar healing well. Ileostomy is functioning. No evidence of peritonitis. No incarcerated hernias. GU: no CVA tenderness Musculoskeletal: No joint deformities, erythema, or stiffness, ROM full and no nontender Ext: no edema and no cyanosis, pulses palpable bilaterally (DP and PT)  Hematology: no cervical, inginal, or axillary adenopathy.  Neurological:  somnolent, Strenght is normal and symmetric bilaterally,  cranial nerve II-XII are grossly intact, no focal motor deficit, sensory intact to light touch bilaterally.  Skin: Warm, dry and intact. No rash, cyanosis, or clubbing.  Psychiatric: Normal mood and affect. speech and behavior is normal. Judgment and thought content normal. Cognition and memory are normal.      Data Review   Micro Results No results found for this or any previous visit (from the past 240 hour(s)).  Radiology Reports Ct Abdomen Pelvis Wo Contrast  06/21/2015  CLINICAL DATA:  Nausea and vomiting for the past 4 days. Status post GIST tumor removal on 06/04/2015. EXAM: CT ABDOMEN AND PELVIS WITHOUT CONTRAST TECHNIQUE: Multidetector CT imaging of the abdomen and pelvis was performed following the standard protocol without IV contrast. COMPARISON:  06/17/2015. FINDINGS: Lower chest: 3 mm left lower lobe nodule on image number 6. This has not changed significantly since 07/22/2014 and was not included on other previous examinations. Hepatobiliary: Cholecystectomy clips.  Unremarkable liver. Pancreas: No mass or inflammatory  process identified on this un-enhanced exam. Spleen: Within normal limits in size. Adrenals/Urinary Tract: No evidence of urolithiasis or hydronephrosis. No definite mass visualized on this un-enhanced exam. Stomach/Bowel: Right lower quadrant anterior ileostomy with passage of oral contrast into the ostomy bag. Decreased soft tissue stranding and fluid in the surrounding subcutaneous fat. Soft tissue stranding mower lateral to that location is unchanged. Bilateral subcutaneous air is again demonstrated. Mildly prominent loops of jejunum with improvement. This transitions to normal caliber small bowel in the left mid abdomen, best seen on coronal image number 36. Small hiatal hernia. Scattered colonic diverticula. No evidence of appendicitis. Vascular/Lymphatic: Atheromatous coronary artery calcifications. No enlarged lymph nodes. Reproductive: Surgically absent uterus.  No adnexal masses. Other: Mild perirectal and presacral soft tissue thickening and edema and soft tissue air. Minimal additional free peritoneal air. Stable large postoperative seroma in the anterior pelvic wall, with some soft tissue density components. This measures 13.1 x 7.9 cm on image number 63. Musculoskeletal: Right hip total prosthesis. Left iliac bone island. Lumbar and lower thoracic spine degenerative changes and mild scoliosis IMPRESSION: 1. Stable postsurgical changes, as described above. 2. Mildly improved postsurgical jejunal ileus or partial obstruction, with transition in the left mid abdomen. This could be an indication of an adhesion. 3. Small hiatal hernia. 4. Colonic diverticulosis. Electronically Signed   By: Claudie Revering M.D.   On: 06/21/2015 11:19   Ct Abdomen Pelvis W Contrast  06/17/2015  CLINICAL DATA:  Status post low anterior resection of colon EXAM: CT ABDOMEN AND PELVIS WITH CONTRAST TECHNIQUE: Multidetector CT imaging of the abdomen and pelvis was performed using the standard protocol following bolus  administration of intravenous contrast. CONTRAST:  150mL OMNIPAQUE IOHEXOL 300 MG/ML  SOLN COMPARISON:  07/22/2014, 06/15/2015 FINDINGS: Lung bases are free of acute infiltrate or sizable effusion. The gallbladder has been surgically removed. The liver, spleen, adrenal glands and pancreas are within normal limits. The kidneys demonstrate a normal enhancement pattern bilaterally. A few small cysts are seen. No obstructive changes are noted. No renal calculi are noted. Considerable air is noted within the abdominal wall related to the recent surgery. There is considerable of the high ablation of the proximal jejunum identified. The loops are fluid filled. No definitive obstructive mass is seen although transition zone is noted as seen on images 45-48 of series 2. The overall appearance is stable when compared with the plain film from 06/15/2015. Again this may represent a degree of ileus. A loop ileostomy is noted in the  right mid abdomen. A chronic seroma is again identified in the anterior abdominal wall adjacent to the area of the loop ostomy. The bladder is well distended. No free pelvic fluid is noted. No abscess or postoperative fluid collection is identified. No bony abnormality is seen. Prior right hip replacement is noted. IMPRESSION: Considerable amount of retained air within the abdominal wall related to the recent surgery. Dilatation of the jejunum likely representing an ileus or partial small bowel obstruction. No obstructing lesion is identified. Changes consistent with the recent surgery with loop ileostomy on the right. Chronic postoperative seroma stable from multiple previous exams. No definitive abscess is seen. Electronically Signed   By: Inez Catalina M.D.   On: 06/17/2015 18:39   Dg Chest Port 1 View  06/21/2015  CLINICAL DATA:  N/V today; Pt recently discharged on 2/17 after a gastrointestinal stroma tumor of distal rectum removal.; HTN; hx PE EXAM: PORTABLE CHEST - 1 VIEW COMPARISON:   07/13/2015 FINDINGS: Lungs are clear. Heart size and mediastinal contours are within normal limits. No effusion. Visualized skeletal structures are unremarkable. Stable right arm PICC, tip low SVC. Surgical clips, right upper abdomen. IMPRESSION: No acute cardiopulmonary disease. Electronically Signed   By: Lucrezia Europe M.D.   On: 06/21/2015 12:43   Dg Abd Acute W/chest  06/15/2015  CLINICAL DATA:  70 year old with resection of a large a no rectal GI stromal tumor on 06/04/2015 and resection of a cystic mass involving the anterior abdominal wall attached to a loop of mid ileum with serosal repair. Acute onset of nausea and vomiting which began last night. EXAM: DG ABDOMEN ACUTE W/ 1V CHEST COMPARISON:  MRI pelvis 12/14/2014. CT abdomen and pelvis 07/22/2014. Two-view chest x-ray 12/05/2011. FINDINGS: Multiple moderate to markedly distended loops of small bowel in the upper abdomen, demonstrating air-fluid levels on the erect image. Remainder of the small bowel normal in caliber. No evidence of free intraperitoneal air on the erect image. Subcutaneous emphysema throughout the anterior abdominal wall. Surgical mesh material in the right mid abdominal wall. Surgical clips in the right upper quadrant from prior cholecystectomy. Degenerative changes involving the lower lumbar spine. Prior right hip arthroplasty with anatomic alignment in the AP projection. Moderate degenerative changes involving the left hip. Right arm PICC tip projects over the lower SVC. Cardiac silhouette upper normal in size to mildly enlarged, unchanged. Lungs clear. Bronchovascular markings normal. Pulmonary vascularity normal. No visible pleural effusions. No pneumothorax. IMPRESSION: 1. Partial small bowel obstruction versus severe focal ileus in the upper abdomen. Partial obstruction is favored. 2. No free intraperitoneal air. 3. Extensive subcutaneous emphysema. 4. Borderline heart size.  No acute cardiopulmonary disease. 5. Right arm PICC tip  projects over the lower SVC. Electronically Signed   By: Evangeline Dakin M.D.   On: 06/15/2015 14:57     CBC  Recent Labs Lab 06/16/15 0345 06/17/15 0400 06/18/15 0338 06/21/15 0741 06/21/15 1239  WBC 10.9* 11.6* 10.7* 12.7* 11.1*  HGB 8.6* 9.3* 8.7* 9.7* 8.8*  HCT 26.5* 28.2* 26.2* 30.3* 26.0*  PLT 454* 507* 472* 522* 370  MCV 87.2 86.8 85.9 85.4 83.9  MCH 28.3 28.6 28.5 27.3 28.4  MCHC 32.5 33.0 33.2 32.0 33.8  RDW 15.1 15.3 15.0 15.2 15.2  LYMPHSABS  --   --   --  0.7  --   MONOABS  --   --   --  0.8  --   EOSABS  --   --   --  0.0  --  BASOSABS  --   --   --  0.0  --     Chemistries   Recent Labs Lab 06/15/15 0420 06/16/15 0345 06/17/15 0400 06/18/15 0338 06/21/15 0741 06/21/15 1239  NA 139 138 137 140 134*  --   K 3.9 3.7 3.3* 3.6 3.5  --   CL 102 102 98* 102 95*  --   CO2 25 26 27 27 23   --   GLUCOSE 86 89 108* 82 120*  --   BUN 7 9 12 10  35*  --   CREATININE 0.92 0.91 0.94 0.98 5.91*  --   CALCIUM 8.8* 9.1 8.9 8.8* 9.0  --   MG  --   --   --  1.8  --  1.7  AST  --   --   --   --  16  --   ALT  --   --   --   --  22  --   ALKPHOS  --   --   --   --  119  --   BILITOT  --   --   --   --  1.2  --    ------------------------------------------------------------------------------------------------------------------ estimated creatinine clearance is 10.5 mL/min (by C-G formula based on Cr of 5.91). ------------------------------------------------------------------------------------------------------------------ No results for input(s): HGBA1C in the last 72 hours. ------------------------------------------------------------------------------------------------------------------ No results for input(s): CHOL, HDL, LDLCALC, TRIG, CHOLHDL, LDLDIRECT in the last 72 hours. ------------------------------------------------------------------------------------------------------------------  Recent Labs  06/21/15 1239  TSH 0.102*    ------------------------------------------------------------------------------------------------------------------ No results for input(s): VITAMINB12, FOLATE, FERRITIN, TIBC, IRON, RETICCTPCT in the last 72 hours.  Coagulation profile No results for input(s): INR, PROTIME in the last 168 hours.  No results for input(s): DDIMER in the last 72 hours.  Cardiac Enzymes No results for input(s): CKMB, TROPONINI, MYOGLOBIN in the last 168 hours.  Invalid input(s): CK ------------------------------------------------------------------------------------------------------------------ Invalid input(s): POCBNP   CBG: No results for input(s): GLUCAP in the last 168 hours.     EKG: Independently reviewed.    Assessment/Plan Principal Problem:   AKI (acute kidney injury) (HCC)-prerenal, likely secondary to intractable nausea vomiting vs  secondary to high ileostomy output vs ACE inhibitor Strict I's and O's Continue aggressive fluid hydration UA negative, no signs of UTI  Diarrhea-rule out C. difficile, C. 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      Family Communication: bedside Disposition Plan: admit   Total time spent 55 minutes.Greater than 50% of this time was spent in counseling, explanation of diagnosis, planning of further  management, and coordination of care  [facility] Pager [phone]  If 7PM-7AM, please contact night-coverage [email or web address] Password [email or web address] 06/21/2015, 2:20 PM

## 2015-06-22 DIAGNOSIS — E785 Hyperlipidemia, unspecified: Secondary | ICD-10-CM

## 2015-06-22 DIAGNOSIS — K219 Gastro-esophageal reflux disease without esophagitis: Secondary | ICD-10-CM

## 2015-06-22 LAB — CBC
HCT: 23.3 % — ABNORMAL LOW (ref 36.0–46.0)
HEMOGLOBIN: 7.8 g/dL — AB (ref 12.0–15.0)
MCH: 28.5 pg (ref 26.0–34.0)
MCHC: 33.5 g/dL (ref 30.0–36.0)
MCV: 85 fL (ref 78.0–100.0)
Platelets: 352 10*3/uL (ref 150–400)
RBC: 2.74 MIL/uL — ABNORMAL LOW (ref 3.87–5.11)
RDW: 15.5 % (ref 11.5–15.5)
WBC: 8.2 10*3/uL (ref 4.0–10.5)

## 2015-06-22 LAB — GASTROINTESTINAL PANEL BY PCR, STOOL (REPLACES STOOL CULTURE)
Adenovirus F40/41: NOT DETECTED
Astrovirus: NOT DETECTED
CRYPTOSPORIDIUM: NOT DETECTED
CYCLOSPORA CAYETANENSIS: NOT DETECTED
Campylobacter species: NOT DETECTED
E. coli O157: NOT DETECTED
ENTAMOEBA HISTOLYTICA: NOT DETECTED
ENTEROAGGREGATIVE E COLI (EAEC): NOT DETECTED
Enteropathogenic E coli (EPEC): NOT DETECTED
Enterotoxigenic E coli (ETEC): NOT DETECTED
GIARDIA LAMBLIA: NOT DETECTED
Norovirus GI/GII: NOT DETECTED
Plesimonas shigelloides: NOT DETECTED
Rotavirus A: NOT DETECTED
SALMONELLA SPECIES: NOT DETECTED
SHIGELLA/ENTEROINVASIVE E COLI (EIEC): NOT DETECTED
Sapovirus (I, II, IV, and V): NOT DETECTED
Shiga like toxin producing E coli (STEC): NOT DETECTED
VIBRIO CHOLERAE: NOT DETECTED
VIBRIO SPECIES: NOT DETECTED
YERSINIA ENTEROCOLITICA: NOT DETECTED

## 2015-06-22 LAB — URINE CULTURE: CULTURE: NO GROWTH

## 2015-06-22 LAB — COMPREHENSIVE METABOLIC PANEL
ALBUMIN: 2.5 g/dL — AB (ref 3.5–5.0)
ALK PHOS: 87 U/L (ref 38–126)
ALT: 14 U/L (ref 14–54)
ANION GAP: 10 (ref 5–15)
AST: 13 U/L — ABNORMAL LOW (ref 15–41)
BILIRUBIN TOTAL: 0.8 mg/dL (ref 0.3–1.2)
BUN: 33 mg/dL — ABNORMAL HIGH (ref 6–20)
CALCIUM: 7.9 mg/dL — AB (ref 8.9–10.3)
CO2: 22 mmol/L (ref 22–32)
Chloride: 104 mmol/L (ref 101–111)
Creatinine, Ser: 4.29 mg/dL — ABNORMAL HIGH (ref 0.44–1.00)
GFR, EST AFRICAN AMERICAN: 11 mL/min — AB (ref >60)
GFR, EST NON AFRICAN AMERICAN: 10 mL/min — AB (ref >60)
GLUCOSE: 96 mg/dL (ref 65–99)
Potassium: 3.6 mmol/L (ref 3.5–5.1)
Sodium: 136 mmol/L (ref 135–145)
TOTAL PROTEIN: 6 g/dL — AB (ref 6.5–8.1)

## 2015-06-22 MED ORDER — ENOXAPARIN SODIUM 100 MG/ML ~~LOC~~ SOLN
1.0000 mg/kg | SUBCUTANEOUS | Status: DC
  Administered 2015-06-22 – 2015-06-23 (×2): 90 mg via SUBCUTANEOUS
  Filled 2015-06-22 (×2): qty 1

## 2015-06-22 MED ORDER — BOOST / RESOURCE BREEZE PO LIQD
1.0000 | Freq: Two times a day (BID) | ORAL | Status: DC
  Administered 2015-06-22: 1 via ORAL

## 2015-06-22 MED ORDER — LACTATED RINGERS IV BOLUS (SEPSIS)
1000.0000 mL | Freq: Once | INTRAVENOUS | Status: AC
Start: 2015-06-22 — End: 2015-06-22
  Administered 2015-06-22: 1000 mL via INTRAVENOUS

## 2015-06-22 MED ORDER — ENSURE ENLIVE PO LIQD
237.0000 mL | ORAL | Status: DC
  Administered 2015-06-22: 237 mL via ORAL

## 2015-06-22 MED ORDER — APIXABAN 5 MG PO TABS
5.0000 mg | ORAL_TABLET | Freq: Two times a day (BID) | ORAL | Status: DC
  Administered 2015-06-22 (×2): 5 mg via ORAL
  Filled 2015-06-22 (×4): qty 1

## 2015-06-22 NOTE — Progress Notes (Signed)
Initial Nutrition Assessment  DOCUMENTATION CODES:   Severe malnutrition in context of acute illness/injury, Obesity unspecified  INTERVENTION:  - Will order Ensure Enlive once/day, this supplement provides 350 kcal and 20 grams of protein - Will order Boost Breeze BID, each supplement provides 250 kcal and 9 grams of protein - Recommend diet texture adjustment (to regular) if medically feasible - RD will continue to monitor for needs  NUTRITION DIAGNOSIS:   Inadequate oral intake related to acute illness, nausea, vomiting as evidenced by per patient/family report.  GOAL:   Patient will meet greater than or equal to 90% of their needs  MONITOR:   PO intake, Diet advancement, Weight trends, Labs, Skin, I & O's  REASON FOR ASSESSMENT:   Consult Calorie Count  ASSESSMENT:   70 year old female status post laparoscopic and robotic resection of GIST TUMOR lysis of adhesions on 2/3, discharged on 2/17, presents to the ER today with acute renal failure, dehydration, nausea vomiting, high ileostomy output, poor appetite and subjective fevers. General surgery consulted due to recent surgery. CT abdomen pelvis shows postsurgical changes, postsurgical ileus vs partial small bowel obstruction, small hiatal hernia.  Pt seen for calorie count/full assessment. BMI indicates obesity. Pt was on CLD yesterday and diet now advanced to Dysphagia 1 with thin liquids. Pt denies chewing or swallowing issues and states she was consuming a Regular diet PTA; pt requesting adjustment to diet texture as she is turned off to eating with current texture. She states that she consumed 4 spoonfuls (~25% of grits) and 100% of chocolate milk for breakfast this AM but refused the rest of the tray due to texture.  Pt was d/c from the hospital 2/17. She states since this time she has had ongoing nausea and vomiting both with and without PO intakes. She reports inability to keep down solid foods and liquids during this  time. She states that she was using antinausea medication but this did not help. Pt states that this N/V was going on prior to d/c from recent hospitalization. She states that prior to this admission she had high output (5-6 bags) from her ileostomy each day. She states that output has decreased and is slightly more formed.  Pt expresses concern about difficulty understanding how to change bag to ostomy site. She reports that family members have been assisting but she is still experiencing leaking from site when she is at home.   Will order supplements as outlined above to assist and monitor for tolerance/acceptance. Will check in 2/22 and 2/23 for results of calorie count. Physical assessment shows no muscle or fat wasting at this time. Per chart review, pt has lost 6 lbs (3% body weight) in the past 5 days which is significant for time frame. She has also lost 24 lbs (11% body weight) in the past 1 month which is also significant for time frame.   Not meeting needs. Medications reviewed. Labs reviewed; BUN/creatinine elevated but trending down, Ca: 7.9 mg/dL, GFR: 11.   Diet Order:  DIET - DYS 1 Room service appropriate?: Yes; Fluid consistency:: Thin  Skin:  Reviewed, no issues  Last BM:  2/21  Height:   Ht Readings from Last 1 Encounters:  06/22/15 5\' 3"  (1.6 m)    Weight:   Wt Readings from Last 1 Encounters:  06/22/15 202 lb 13.2 oz (92 kg)    Ideal Body Weight:  52.27 kg (kg)  BMI:  Body mass index is 35.94 kg/(m^2).  Estimated Nutritional Needs:   Kcal:  2200-2400  Protein:  120-130 grams  Fluid:  >/= 2.8 L/day  EDUCATION NEEDS:   No education needs identified at this time     Jarome Matin, RD, LDN Inpatient Clinical Dietitian Pager # [phone] After hours/weekend pager # [phone]

## 2015-06-22 NOTE — Progress Notes (Signed)
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 Resection of cystic mass near small bowel with serosal repair.  Surgeon(s): Michael Boston, MD Leighton Ruff, MD - Assist   Diagnosis 1. Soft tissue, biopsy, abdominal wall cyst ? fistula BENIGN CYST AND REACTIVE CHANGES NEGATIVE FOR MALIGNANCY 2. Soft tissue mass, simple excision, Pelvic GIST GASTROINTESTINAL STROMAL TUMORS OF THE RECTUM SHOWING TREATMENT EFFECT (9.5 CM) MARGIN OF RESECTION IS POSITIVE 3. Colon, segmental resection for tumor, rectum DIVERTICULA NO RESIDUAL GASTROINTESTINAL STROMAL TUMOR IDENTIFIED MARGINS OF RESECTION ARE NEGATIVE FIFTEEN BENIGN LYMPH NODES (0/15) Microscopic Comment 2. GASTROINTESTINAL STROMAL TUMOR (GIST): Procedure: segmental resection Tumor Site: rectum Specify: pelvis rectum Tumor Size: 9.5 cm Tumor Focality Unifocal: focal GIST Subtype (spindle, epithelioid, mixed, etc.): spindle Mitotic Rate: 1 /50 HIGH POWER FIELD Necrosis: negative Histologic Grade 1 G1: Low grade; mitotic rate 5/50 HIGH POWER FIELD. G2: High grade; mitotic rate >5/50 HIGH POWER FIELD. Risk Assessment: _ Margins: positive Ancillary testing: Per request 1 of 3 FINAL for Brianna Walker, Brianna Walker [phone]) Microscopic Comment(continued) Lymph nodes: number  examined: 15; number positive: 0 Pathologic Staging: pT3 pN0 Comments: The tumor shows treatment effect with hypocellular, atrophic spindle cells and hyalinized, fibrotic stroma. less than 1% of the tumor without treatment effect. Brianna Lanius MD Pathologist, Electronic Signature (Case signed 06/10/2015) Specimen Brianna Walker and Clinical Information Specimen(s) Obtained: 1. Soft tissue, biopsy, abdominal wall cyst ? fistula 2. Soft tissue mass, simple excision, Pelvic GIST 3. Colon, segmental resection for tumor, rectum Specimen Clinical Information 1. GIST tumor of rectum in pelvis (kp) Brianna Walker 1. In formalin is a a 3.1 x 1.4 x 0.3 cm irregular portion of fatty tissue, which on sectioning contains a 0.5 cm collapsed smooth cystic space. Sectioned and entirely submitted in one block. 2. Received in formalin is a 176 gram, 9.5 x 6.8 x 5.8 cm rubbery to firm mass, which has an attached suture over a 2.1 x 1.5 cm roughened area, clinically identifying anterior distal. This area is inked orange. There is also a 5.0 x 4.2 cm disrupted area, which is clinically identified as disrupted margin. This area is inked black. Also along one aspect is a 3.4 x 3.3 cm area of tan to hyperemic possible smooth mucosa. The cut surfaces of the mass are tan to tan yellow, with vaguely whorled cut surfaces, and scattered myxoid areas. Representative sections are submitted as follows: A, B = sections from clinically disrupted margin C = sections from area identified by suture as anterior distal D, E = central sections Total: 5 blocks submitted 3. Received in formalin is a 27 cm in length segment of colon, clinically recto-sigmoid, which includes at least probable middle third of rectum. Surrounding mesorectum is incomplete and focally disrupted. There is an attached suture over the anterior perirectal surface. The mucosa is tan pink, smooth, soft with normal intestinal folds. There are no mucosal lesions identified.  Within the  sigmoid portion are several unperforated diverticula. Found within the surrounding fat are 15 possible lymph nodes ranging from 0.1 to 0.6 cm. Block Summary: A = proximal margin B, C = distal margin D, E = sections taken at attached suture over anterior mesorectum F = diverticulum G = four nodes, whole H = four nodes, whole I = four nodes, whole J = three nodes, whole Total: 10 blocks submitted (SW:ds 06/07/15) 2 of 3 FINAL for Brianna Walker, Brianna Walker ([id number]) Report signed out from the following location(s) Technical component and interpretation was performed at [facility], [place], [place] [address]. CLIA #: [id number], 3 of 3 Assessment  RECURRENT DEHYDRATION   Plan:  -IVF for ARF/dehydration  -STRICT I&O  -Retry PO - pt feels much better (but she has said that before) - get cal counts  -PRN nausea control.    -will r/o stricture at ostomy - no evid by CT & higher output argues against this but N/V concerning  -OSTOMY CARE / TRAINING  -antidiarrheal regimen as tolerated to avoid dehydration  -VTE prophylaxis- SCDs, etc  -GERD - PPI  -hyperlipidemia - Tx  -HTN  - no ACE Inh  -h/o PE - inc ASA.  Restart Eliquis as tolerated.  Monitor with ARF  -mobilize as tolerated to help recovery  -Pathology - ?+margin most likely fracture of GIST exposed.  Most likely will get gleevec post-adjuvant for 3 years total per Dr Benay Spice.    Adin [name], M.D., F.A.C.S. Gastrointestinal and Minimally Invasive Surgery [facility] [address], [place], [place] [address] [phone] Main / Paging   06/22/2015  Subjective:  "I feel A LOT better!" Finally getting floor bed No anorectal pain No pain at ileostomy Exptying ileostomy bag    Objective:  Vital signs:  Filed Vitals:   06/22/15 0530 06/22/15 0600 06/22/15 0630 06/22/15 0700  BP: 107/60 100/59 110/66 121/64  Pulse: 100 98 94  96  Temp:      TempSrc:      Resp: '18 20 19 21  ' SpO2: 93% 93% 94% 95%       Intake/Output   Yesterday:  02/20 0701 - 02/21 0700 In: 4700 [P.O.:500; I.V.:3600] Out: 800 [Urine:200; Stool:600] This shift:     Bowel function:  Flatus: YES  BM: thick effluent in bag.  Occ small BM out anus  Drain: serosanguinous  Physical Exam:  General: Pt awake/alert/oriented x4 in no acute distress.  Smiling, alert Eyes: PERRL, normal EOM.  Sclera clear.  No icterus Neuro: CN II-XII intact w/o focal sensory/motor deficits. Lymph: No head/neck/groin lymphadenopathy Psych:  No delerium/psychosis/paranoia HENT: Normocephalic, Mucus membranes moist.  No thrush Neck: Supple, No tracheal deviation Chest: No chest wall pain w good excursion CV:  Pulses intact.  Regular rhythm MS: Normal AROM mjr joints.  No obvious deformity Abdomen: Soft but morbidly obese.  Nondistended.  Nontender.  No fluctuance/cellultis.  No guarding.  No evidence of peritonitis.  No incarcerated hernias.  Loop ileostomy pink with moderate thick effluent out superior orifice.  No necrosis Ext:  SCDs BLE.  No mjr edema.  No cyanosis Skin: No petechiae / purpura  Results:   Labs: Results for orders placed or performed during the hospital encounter of 06/21/15 (from the past 48 hour(s))  CBC with Differential     Status: Abnormal   Collection Time: 06/21/15  7:41 AM  Result Value Ref Range   WBC 12.7 (H) 4.0 - 10.5 K/uL  RBC 3.55 (L) 3.87 - 5.11 MIL/uL   Hemoglobin 9.7 (L) 12.0 - 15.0 g/dL   HCT 30.3 (L) 36.0 - 46.0 %   MCV 85.4 78.0 - 100.0 fL   MCH 27.3 26.0 - 34.0 pg   MCHC 32.0 30.0 - 36.0 g/dL   RDW 15.2 11.5 - 15.5 %   Platelets 522 (H) 150 - 400 K/uL   Neutrophils Relative % 87 %   Neutro Abs 11.1 (H) 1.7 - 7.7 K/uL   Lymphocytes Relative 6 %   Lymphs Abs 0.7 0.7 - 4.0 K/uL   Monocytes Relative 7 %   Monocytes Absolute 0.8 0.1 - 1.0 K/uL   Eosinophils Relative 0 %   Eosinophils Absolute 0.0 0.0 - 0.7  K/uL   Basophils Relative 0 %   Basophils Absolute 0.0 0.0 - 0.1 K/uL  Comprehensive metabolic panel     Status: Abnormal   Collection Time: 06/21/15  7:41 AM  Result Value Ref Range   Sodium 134 (L) 135 - 145 mmol/L   Potassium 3.5 3.5 - 5.1 mmol/L   Chloride 95 (L) 101 - 111 mmol/L   CO2 23 22 - 32 mmol/L   Glucose, Bld 120 (H) 65 - 99 mg/dL   BUN 35 (H) 6 - 20 mg/dL   Creatinine, Ser 5.91 (H) 0.44 - 1.00 mg/dL   Calcium 9.0 8.9 - 10.3 mg/dL   Total Protein 7.8 6.5 - 8.1 g/dL   Albumin 3.6 3.5 - 5.0 g/dL   AST 16 15 - 41 U/L   ALT 22 14 - 54 U/L   Alkaline Phosphatase 119 38 - 126 U/L   Total Bilirubin 1.2 0.3 - 1.2 mg/dL   GFR calc non Af Amer 7 (L) >60 mL/min   GFR calc Af Amer 8 (L) >60 mL/min    Comment: (NOTE) The eGFR has been calculated using the CKD EPI equation. This calculation has not been validated in all clinical situations. eGFR's persistently <60 mL/min signify possible Chronic Kidney Disease.    Anion gap 16 (H) 5 - 15  Lipase, blood     Status: Abnormal   Collection Time: 06/21/15  7:41 AM  Result Value Ref Range   Lipase 85 (H) 11 - 51 U/L  I-Stat CG4 Lactic Acid, ED     Status: None   Collection Time: 06/21/15  8:00 AM  Result Value Ref Range   Lactic Acid, Venous 1.13 0.5 - 2.0 mmol/L  I-Stat CG4 Lactic Acid, ED     Status: None   Collection Time: 06/21/15 11:08 AM  Result Value Ref Range   Lactic Acid, Venous 0.57 0.5 - 2.0 mmol/L  CBC     Status: Abnormal   Collection Time: 06/21/15 12:39 PM  Result Value Ref Range   WBC 11.1 (H) 4.0 - 10.5 K/uL   RBC 3.10 (L) 3.87 - 5.11 MIL/uL   Hemoglobin 8.8 (L) 12.0 - 15.0 g/dL   HCT 26.0 (L) 36.0 - 46.0 %   MCV 83.9 78.0 - 100.0 fL   MCH 28.4 26.0 - 34.0 pg   MCHC 33.8 30.0 - 36.0 g/dL   RDW 15.2 11.5 - 15.5 %   Platelets 370 150 - 400 K/uL  Magnesium     Status: None   Collection Time: 06/21/15 12:39 PM  Result Value Ref Range   Magnesium 1.7 1.7 - 2.4 mg/dL  TSH     Status: Abnormal    Collection Time: 06/21/15 12:39 PM  Result Value Ref Range  TSH 0.102 (L) 0.350 - 4.500 uIU/mL  T4, free     Status: Abnormal   Collection Time: 06/21/15 12:39 PM  Result Value Ref Range   Free T4 1.35 (H) 0.61 - 1.12 ng/dL    Comment: Performed at [facility]  Urinalysis, Routine w reflex microscopic (not at [facility])     Status: Abnormal   Collection Time: 06/21/15  1:16 PM  Result Value Ref Range   Color, Urine AMBER (A) YELLOW    Comment: BIOCHEMICALS MAY BE AFFECTED BY COLOR   APPearance CLOUDY (A) CLEAR   Specific Gravity, Urine 1.016 1.005 - 1.030   pH 5.0 5.0 - 8.0   Glucose, UA NEGATIVE NEGATIVE mg/dL   Hgb urine dipstick NEGATIVE NEGATIVE   Bilirubin Urine MODERATE (A) NEGATIVE   Ketones, ur NEGATIVE NEGATIVE mg/dL   Protein, ur NEGATIVE NEGATIVE mg/dL   Nitrite NEGATIVE NEGATIVE   Leukocytes, UA NEGATIVE NEGATIVE    Comment: MICROSCOPIC NOT DONE ON URINES WITH NEGATIVE PROTEIN, BLOOD, LEUKOCYTES, NITRITE, OR GLUCOSE <1000 mg/dL.  Blood gas, arterial     Status: Abnormal   Collection Time: 06/21/15  2:34 PM  Result Value Ref Range   FIO2 0.21    pH, Arterial 7.401 7.350 - 7.450   pCO2 arterial 31.7 (L) 35.0 - 45.0 mmHg   pO2, Arterial 83.5 80.0 - 100.0 mmHg   Bicarbonate 19.3 (L) 20.0 - 24.0 mEq/L   TCO2 18.0 0 - 100 mmol/L   Acid-base deficit 4.3 (H) 0.0 - 2.0 mmol/L   O2 Saturation 95.6 %   Patient temperature 98.6    Collection site LEFT RADIAL    Drawn by [id number]    Sample type ARTERIAL DRAW    Allens test (pass/fail) PASS PASS  C difficile quick scan w PCR reflex     Status: None   Collection Time: 06/21/15  7:21 PM  Result Value Ref Range   C Diff antigen NEGATIVE NEGATIVE   C Diff toxin NEGATIVE NEGATIVE   C Diff interpretation Negative for toxigenic C. difficile   Comprehensive metabolic panel     Status: Abnormal   Collection Time: 06/22/15  4:06 AM  Result Value Ref Range   Sodium 136 135 - 145 mmol/L   Potassium 3.6 3.5 - 5.1 mmol/L    Chloride 104 101 - 111 mmol/L   CO2 22 22 - 32 mmol/L   Glucose, Bld 96 65 - 99 mg/dL   BUN 33 (H) 6 - 20 mg/dL   Creatinine, Ser 4.29 (H) 0.44 - 1.00 mg/dL   Calcium 7.9 (L) 8.9 - 10.3 mg/dL   Total Protein 6.0 (L) 6.5 - 8.1 g/dL   Albumin 2.5 (L) 3.5 - 5.0 g/dL   AST 13 (L) 15 - 41 U/L   ALT 14 14 - 54 U/L   Alkaline Phosphatase 87 38 - 126 U/L   Total Bilirubin 0.8 0.3 - 1.2 mg/dL   GFR calc non Af Amer 10 (L) >60 mL/min   GFR calc Af Amer 11 (L) >60 mL/min    Comment: (NOTE) The eGFR has been calculated using the CKD EPI equation. This calculation has not been validated in all clinical situations. eGFR's persistently <60 mL/min signify possible Chronic Kidney Disease.    Anion gap 10 5 - 15  CBC     Status: Abnormal   Collection Time: 06/22/15  4:06 AM  Result Value Ref Range   WBC 8.2 4.0 - 10.5 K/uL   RBC 2.74 (L) 3.87 - 5.11  MIL/uL   Hemoglobin 7.8 (L) 12.0 - 15.0 g/dL   HCT 23.3 (L) 36.0 - 46.0 %   MCV 85.0 78.0 - 100.0 fL   MCH 28.5 26.0 - 34.0 pg   MCHC 33.5 30.0 - 36.0 g/dL   RDW 15.5 11.5 - 15.5 %   Platelets 352 150 - 400 K/uL      Imaging / Studies: Ct Abdomen Pelvis Wo Contrast  06/21/2015  CLINICAL DATA:  Nausea and vomiting for the past 4 days. Status post GIST tumor removal on 06/04/2015. EXAM: CT ABDOMEN AND PELVIS WITHOUT CONTRAST TECHNIQUE: Multidetector CT imaging of the abdomen and pelvis was performed following the standard protocol without IV contrast. COMPARISON:  06/17/2015. FINDINGS: Lower chest: 3 mm left lower lobe nodule on image number 6. This has not changed significantly since 07/22/2014 and was not included on other previous examinations. Hepatobiliary: Cholecystectomy clips.  Unremarkable liver. Pancreas: No mass or inflammatory process identified on this un-enhanced exam. Spleen: Within normal limits in size. Adrenals/Urinary Tract: No evidence of urolithiasis or hydronephrosis. No definite mass visualized on this un-enhanced exam.  Stomach/Bowel: Right lower quadrant anterior ileostomy with passage of oral contrast into the ostomy bag. Decreased soft tissue stranding and fluid in the surrounding subcutaneous fat. Soft tissue stranding mower lateral to that location is unchanged. Bilateral subcutaneous air is again demonstrated. Mildly prominent loops of jejunum with improvement. This transitions to normal caliber small bowel in the left mid abdomen, best seen on coronal image number 36. Small hiatal hernia. Scattered colonic diverticula. No evidence of appendicitis. Vascular/Lymphatic: Atheromatous coronary artery calcifications. No enlarged lymph nodes. Reproductive: Surgically absent uterus.  No adnexal masses. Other: Mild perirectal and presacral soft tissue thickening and edema and soft tissue air. Minimal additional free peritoneal air. Stable large postoperative seroma in the anterior pelvic wall, with some soft tissue density components. This measures 13.1 x 7.9 cm on image number 63. Musculoskeletal: Right hip total prosthesis. Left iliac bone island. Lumbar and lower thoracic spine degenerative changes and mild scoliosis IMPRESSION: 1. Stable postsurgical changes, as described above. 2. Mildly improved postsurgical jejunal ileus or partial obstruction, with transition in the left mid abdomen. This could be an indication of an adhesion. 3. Small hiatal hernia. 4. Colonic diverticulosis. Electronically Signed   By: Claudie Revering M.D.   On: 06/21/2015 11:19   Dg Chest Portable 1 View  06/21/2015  CLINICAL DATA:  Nausea and vomiting since Saturday with GI stromal tumor of rectum. EXAM: PORTABLE CHEST 1 VIEW COMPARISON:  06/21/2015, earlier the same day FINDINGS: 1827 hours. Lung volumes are low without focal airspace consolidation or pulmonary edema. No evidence for pleural effusion. Cardiopericardial silhouette is at upper limits of normal for size. Right PICC line tip overlies the mid SVC. Telemetry leads overlie the chest.  IMPRESSION: Stable.  No acute findings. Electronically Signed   By: Misty Stanley M.D.   On: 06/21/2015 18:50   Dg Chest Port 1 View  06/21/2015  CLINICAL DATA:  N/V today; Pt recently discharged on 2/17 after a gastrointestinal stroma tumor of distal rectum removal.; HTN; hx PE EXAM: PORTABLE CHEST - 1 VIEW COMPARISON:  07/13/2015 FINDINGS: Lungs are clear. Heart size and mediastinal contours are within normal limits. No effusion. Visualized skeletal structures are unremarkable. Stable right arm PICC, tip low SVC. Surgical clips, right upper abdomen. IMPRESSION: No acute cardiopulmonary disease. Electronically Signed   By: Lucrezia Europe M.D.   On: 06/21/2015 12:43    Medications / Allergies: per chart  Antibiotics: Anti-infectives    None        Note: Portions of this report may have been transcribed using voice recognition software. Every effort was made to ensure accuracy; however, inadvertent computerized transcription errors may be present.   Any transcriptional errors that result from this process are unintentional.     Adin [name], M.D., F.A.C.S. Gastrointestinal and Minimally Invasive Surgery [facility] [address], [place], [place] [address] [phone] Main / Paging   06/22/2015  CARE TEAM:  PCP: Cathlean Cower, MD  Outpatient Care Team: Patient Care Team: Biagio Borg, MD as PCP - General Michael Boston, MD as Consulting Physician (General Surgery) Ladell Pier, MD as Consulting Physician (Oncology) Milus Banister, MD as Consulting Physician (Gastroenterology) Billy Fischer, MD as Consulting Physician (Family Medicine)  Inpatient Treatment Team: Treatment Team: Attending Provider: Theodis Blaze, MD; Consulting Physician: Michael Boston, MD; Rounding Team: Ian Bushman, MD; Registered Nurse: Kenard Gower, RN; Registered Nurse: Milana Huntsman, RN; Registered Nurse: Cathie Hoops, RN; Technician: Azzie Almas, NT

## 2015-06-22 NOTE — Evaluation (Signed)
Physical Therapy Evaluation Patient Details Name: Brianna Walker MRN: [id number] DOB: Sep 06, 1945 Today's Date: 06/22/2015   History of Present Illness  70 year old female with recent admission for GIST (gastrointestinal stroma tumor) of distal rectum s/p loop ileostomy 06/04/2015, discharged on 2/17, presents to the ER today with acute renal failure, dehydration, nausea vomiting, high ileostomy output, poor appetite and subjective fevers.   Clinical Impression  Pt admitted with above diagnosis. Pt currently with functional limitations due to the deficits listed below (see PT Problem List).  Pt will benefit from skilled PT to increase their independence and safety with mobility to allow discharge to the venue listed below.   Pt ambulated good distance in hallway today and reports feeling/performing poorly at home upon previous d/c so recommend d/c home with HHPT.  She states family has been assisting her. RW recommended last admission, and pt reports she does not have one at home?     Follow Up Recommendations Supervision - Intermittent;Home health PT    Equipment Recommendations  Rolling walker with 5" wheels    Recommendations for Other Services       Precautions / Restrictions Precautions Precautions: Fall Precaution Comments: ileostomy      Mobility  Bed Mobility Overal bed mobility: Needs Assistance Bed Mobility: Supine to Sit;Sit to Supine     Supine to sit: HOB elevated;Supervision Sit to supine: HOB elevated;Supervision   General bed mobility comments: increased time  Transfers Overall transfer level: Needs assistance Equipment used: Rolling walker (2 wheeled) Transfers: Sit to/from Stand Sit to Stand: Min guard         General transfer comment: verbal cues for hand placement  Ambulation/Gait Ambulation/Gait assistance: Min guard Ambulation Distance (Feet): 200 Feet Assistive device: Rolling walker (2 wheeled) Gait Pattern/deviations: Step-through  pattern;Decreased stride length;Trunk flexed     General Gait Details: verbal cues for remaining within RW, slow but steady pace, HR per telemetry monitor 150 bpm during gait  Stairs            Wheelchair Mobility    Modified Rankin (Stroke Patients Only)       Balance                                             Pertinent Vitals/Pain Pain Assessment: No/denies pain    Home Living Family/patient expects to be discharged to:: Private residence Living Arrangements: Other relatives Available Help at Discharge: Family Type of Home: House Home Access: Stairs to enter   Technical brewer of Steps: 1 Home Layout: Two level;Bed/bath upstairs Home Equipment: None      Prior Function Level of Independence: Needs assistance   Gait / Transfers Assistance Needed: states family assisting her at home after recent discharge (independent prior to recent admission)           Hand Dominance        Extremity/Trunk Assessment               Lower Extremity Assessment: Generalized weakness         Communication   Communication: No difficulties  Cognition Arousal/Alertness: Awake/alert Behavior During Therapy: WFL for tasks assessed/performed Overall Cognitive Status: Within Functional Limits for tasks assessed                      General Comments      Exercises  Assessment/Plan    PT Assessment Patient needs continued PT services  PT Diagnosis Generalized weakness   PT Problem List Decreased activity tolerance;Decreased mobility  PT Treatment Interventions DME instruction;Gait training;Stair training;Functional mobility training;Therapeutic activities;Patient/family education;Therapeutic exercise   PT Goals (Current goals can be found in the Care Plan section) Acute Rehab PT Goals PT Goal Formulation: With patient Time For Goal Achievement: 07/06/15 Potential to Achieve Goals: Good    Frequency Min 3X/week    Barriers to discharge        Co-evaluation               End of Session   Activity Tolerance: Patient tolerated treatment well Patient left: with call bell/phone within reach;with bed alarm set;in bed           Time: 1459-1520 PT Time Calculation (min) (ACUTE ONLY): 21 min   Charges:   PT Evaluation $PT Eval Low Complexity: 1 Procedure     PT G Codes:        Valentino Saavedra,KATHrine E 06/22/2015, 3:39 PM Carmelia Bake, PT, DPT 06/22/2015 Pager: [phone]

## 2015-06-22 NOTE — Progress Notes (Signed)
ANTICOAGULATION CONSULT NOTE - Initial Consult  Pharmacy Consult for Lovenox Indication: h/o PE  No Known Allergies  Patient Measurements: Height: 5\' 3"  (160 cm) Weight: 202 lb 13.2 oz (92 kg) IBW/kg (Calculated) : 52.4   Vital Signs: Temp: 98.8 F (37.1 C) (02/21 0830) Temp Source: Oral (02/21 0830) BP: 121/64 mmHg (02/21 0700) Pulse Rate: 96 (02/21 0700)  Labs:  Recent Labs  06/21/15 0741 06/21/15 1239 06/22/15 0406  HGB 9.7* 8.8* 7.8*  HCT 30.3* 26.0* 23.3*  PLT 522* 370 352  CREATININE 5.91*  --  4.29*    Estimated Creatinine Clearance: 13.7 mL/min (by C-G formula based on Cr of 4.29).   Medical History: Past Medical History  Diagnosis Date  . ALLERGIC RHINITIS 09/12/2007  . BACK PAIN 06/19/2008  . Cramp of limb 08/11/2009  . Long term (current) use of anticoagulants 11/29/2010  . FREQUENCY, URINARY 10/07/2009  . GERD 12/05/2006  . HEMORRHOIDS 11/17/2006  . HIP PAIN, RIGHT, CHRONIC 08/11/2009  . HX, PERSONAL, TUBERCULOSIS 11/17/2006  . HYPERLIPIDEMIA 09/12/2007  . HYPERTENSION 11/17/2006  . INSOMNIA-SLEEP DISORDER-UNSPEC 06/19/2008  . LEG PAIN, RIGHT 06/19/2008  . MASS, SUPERFICIAL 09/12/2007  . MENOPAUSAL DISORDER 09/20/2009  . OVERACTIVE BLADDER 03/04/2010  . PULMONARY EMBOLISM, HX OF 11/17/2006    [facility] s/p Pneumonia  . RECTAL BLEEDING 10/07/2007  . SCIATICA, RIGHT 09/12/2007  . SKIN LESION 07/20/2009  . TMJ SYNDROME 11/17/2006  . Iron deficiency anemia 12/07/2011  . Diverticulosis   . Hyperlipidemia 09/12/2007    Qualifier: Diagnosis of  By: Jenny Reichmann MD, Hunt Oris   . Arthritis   . Colon polyps   . DEEP VENOUS THROMBOPHLEBITIS, LEG, RIGHT 03/04/2010    right leg  . Pneumonia   . Rectal mass 06/2014  . Cancer ([place])     dx. 4'16 -oral chemotherapy only.  . Shortness of breath dyspnea     With exertion since chemotherapy tx-oral meds  . Tuberculosis     pt. was tx.      Assessment: 50 yoF on apixaban PTA for h/o PE.  Pt admitted with AKI due  to dehydration 2/2 nausea, vomiting, high ileostomy output, and poor appetite.  With CrCl~13 ml/min, concerned that patient will accumulate apixaban.  Also of note, patients with CrCl<25 ml/min were excluded from apixaban clinical trials.  After discussion with [facility], will hold apixaban until AKI resolves and start Lovenox in the meantime.  Goal of Therapy:  Anti-Xa level 0.6-1 units/ml 4hrs after LMWH dose given Monitor platelets by anticoagulation protocol: Yes   Plan:  Lovenox 90 mg (1 mg/kg) SQ q24h.  First dose tonight, 12 hours after last apixaban dose. F/u SCr and resume apixaban when appropriate.  Hershal Coria 06/22/2015,2:43 PM

## 2015-06-22 NOTE — Progress Notes (Signed)
8:10a- CSW attempted to speak with patient at bedside. Nurse was in the room and she stated patient had just been sent inpatient.

## 2015-06-22 NOTE — Progress Notes (Signed)
Patient ID: Brianna Walker, female   DOB: 05-05-1945, 70 y.o.   MRN: [id number]   [facility] PROGRESS NOTE  Brianna Walker [id number] DOB: 1945-10-21 DOA: 06/21/2015 PCP: Cathlean Cower, MD   Brief narrative:    70 year old female status post laparoscopic and robotic resection of GIST TUMOR lysis of adhesions on 2/3, discharged on 2/17, presented to the ER for evaluation of nausea vomiting, high ileostomy output, poor appetite and subjective fevers. General surgery consulted due to recent surgery. CT abdomen pelvis shows postsurgical changes, postsurgical ileus vs partial small bowel obstruction, small hiatal hernia. Chest x-ray negative. Creatinine has increased from 0.98> 5.91. Patient being admitted to the hospitalist service for further evaluation of her dehydration and renal failure.  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  Family Communication:  plan of care discussed with the patient Disposition Plan: Discharge by 06/24/2015  IV access:  Peripheral IV  Procedures and diagnostic studies:    Ct Abdomen Pelvis Wo Contrast  06/21/2015  CLINICAL DATA:  Nausea and vomiting for the past 4 days. Status post GIST tumor removal on 06/04/2015.  EXAM: CT ABDOMEN AND PELVIS WITHOUT CONTRAST TECHNIQUE: Multidetector CT imaging of the abdomen and pelvis was performed following the standard protocol without IV contrast. COMPARISON:  06/17/2015. FINDINGS: Lower chest: 3 mm left lower lobe nodule on image number 6. This has not changed significantly since 07/22/2014 and was not included on other previous examinations. Hepatobiliary: Cholecystectomy clips.  Unremarkable liver. Pancreas: No mass or inflammatory process identified on this un-enhanced exam. Spleen: Within normal limits in size. Adrenals/Urinary Tract: No evidence of urolithiasis or hydronephrosis. No definite mass visualized on this un-enhanced exam. Stomach/Bowel: Right lower quadrant anterior ileostomy with passage of oral contrast into the ostomy bag. Decreased soft tissue stranding and fluid in the surrounding subcutaneous fat. Soft tissue stranding mower lateral to that location is unchanged. Bilateral subcutaneous air is again demonstrated. Mildly prominent loops of jejunum with improvement. This transitions to normal caliber small bowel in the left mid abdomen, best seen on coronal image number 36. Small hiatal hernia. Scattered colonic diverticula. No evidence of appendicitis. Vascular/Lymphatic: Atheromatous coronary artery calcifications. No enlarged lymph nodes. Reproductive: Surgically absent uterus.  No adnexal masses. Other: Mild perirectal and presacral soft tissue thickening and edema and soft tissue air. Minimal additional free peritoneal air. Stable large postoperative seroma in the anterior pelvic wall, with some soft tissue density components. This measures 13.1 x 7.9 cm on image number 63. Musculoskeletal: Right hip total prosthesis. Left iliac bone island. Lumbar and lower thoracic spine degenerative changes and mild scoliosis IMPRESSION: 1. Stable postsurgical changes, as described above. 2. Mildly improved postsurgical jejunal ileus or partial obstruction, with transition in  the left mid abdomen. This could be  an indication of an adhesion. 3. Small hiatal hernia. 4. Colonic diverticulosis. Electronically Signed   By: Claudie Revering M.D.   On: 06/21/2015 11:19   Ct Abdomen Pelvis W Contrast  06/17/2015  CLINICAL DATA:  Status post low anterior resection of colon EXAM: CT ABDOMEN AND PELVIS WITH CONTRAST TECHNIQUE: Multidetector CT imaging of the abdomen and pelvis was performed using the standard protocol following bolus administration of intravenous contrast. CONTRAST:  177mL OMNIPAQUE IOHEXOL 300 MG/ML  SOLN COMPARISON:  07/22/2014, 06/15/2015 FINDINGS: Lung bases are free of acute infiltrate or sizable effusion. The gallbladder has been surgically removed. The liver, spleen, adrenal glands and pancreas are within normal limits. The kidneys demonstrate a normal enhancement pattern bilaterally. A few small cysts are seen. No obstructive changes are noted. No renal calculi are noted. Considerable air is noted within the abdominal wall related to the recent surgery. There is considerable of the high ablation of the proximal jejunum identified. The loops are fluid filled. No definitive obstructive mass is seen although transition zone is noted as seen on images 45-48 of series 2. The overall appearance is stable when compared with the plain film from 06/15/2015. Again this may represent a degree of ileus. A loop ileostomy is noted in the right mid abdomen. A chronic seroma is again identified in the anterior abdominal wall adjacent to the area of the loop ostomy. The bladder is well distended. No free pelvic fluid is noted. No abscess or postoperative fluid collection is identified. No bony abnormality is seen. Prior right hip replacement is noted. IMPRESSION: Considerable amount of retained air within the abdominal wall related to the recent surgery. Dilatation of the jejunum likely representing an ileus or partial small bowel obstruction. No obstructing lesion is identified. Changes  consistent with the recent surgery with loop ileostomy on the right. Chronic postoperative seroma stable from multiple previous exams. No definitive abscess is seen. Electronically Signed   By: Inez Catalina M.D.   On: 06/17/2015 18:39   Dg Chest Portable 1 View  06/21/2015  CLINICAL DATA:  Nausea and vomiting since Saturday with GI stromal tumor of rectum. EXAM: PORTABLE CHEST 1 VIEW COMPARISON:  06/21/2015, earlier the same day FINDINGS: 1827 hours. Lung volumes are low without focal airspace consolidation or pulmonary edema. No evidence for pleural effusion. Cardiopericardial silhouette is at upper limits of normal for size. Right PICC line tip overlies the mid SVC. Telemetry leads overlie the chest. IMPRESSION: Stable.  No acute findings. Electronically Signed   By: Misty Stanley M.D.   On: 06/21/2015 18:50   Dg Chest Port 1 View  06/21/2015  CLINICAL DATA:  N/V today; Pt recently discharged on 2/17 after a gastrointestinal stroma tumor of distal rectum removal.; HTN; hx PE EXAM: PORTABLE CHEST - 1 VIEW COMPARISON:  07/13/2015 FINDINGS: Lungs are clear. Heart size and mediastinal contours are within normal limits. No effusion. Visualized skeletal structures are unremarkable. Stable right arm PICC, tip low SVC. Surgical clips, right upper abdomen. IMPRESSION: No acute cardiopulmonary disease. Electronically Signed   By: Lucrezia Europe M.D.   On: 06/21/2015 12:43   Dg Abd Acute W/chest  06/15/2015  CLINICAL DATA:  70 year old with resection of a large a no rectal GI stromal tumor on 06/04/2015 and resection of a cystic mass involving the anterior abdominal wall attached to a loop of mid ileum with serosal repair. Acute onset of nausea and vomiting which began last night. EXAM: DG ABDOMEN ACUTE W/ 1V CHEST COMPARISON:  MRI pelvis 12/14/2014.  CT abdomen and pelvis 07/22/2014. Two-view chest x-ray 12/05/2011. FINDINGS: Multiple moderate to markedly distended loops of small bowel in the upper abdomen,  demonstrating air-fluid levels on the erect image. Remainder of the small bowel normal in caliber. No evidence of free intraperitoneal air on the erect image. Subcutaneous emphysema throughout the anterior abdominal wall. Surgical mesh material in the right mid abdominal wall. Surgical clips in the right upper quadrant from prior cholecystectomy. Degenerative changes involving the lower lumbar spine. Prior right hip arthroplasty with anatomic alignment in the AP projection. Moderate degenerative changes involving the left hip. Right arm PICC tip projects over the lower SVC. Cardiac silhouette upper normal in size to mildly enlarged, unchanged. Lungs clear. Bronchovascular markings normal. Pulmonary vascularity normal. No visible pleural effusions. No pneumothorax. IMPRESSION: 1. Partial small bowel obstruction versus severe focal ileus in the upper abdomen. Partial obstruction is favored. 2. No free intraperitoneal air. 3. Extensive subcutaneous emphysema. 4. Borderline heart size.  No acute cardiopulmonary disease. 5. Right arm PICC tip projects over the lower SVC. Electronically Signed   By: Evangeline Dakin M.D.   On: 06/15/2015 14:57    Medical Consultants:  Surgery  Other Consultants:  None   IAnti-Infectives:   None   Faye Ramsay, MD  [facility] Pager [phone]  If 7PM-7AM, please contact night-coverage [email or web address] Password [facility] 06/22/2015, 7:16 PM   LOS: 1 day   HPI/Subjective: No events overnight.   Objective: Filed Vitals:   06/22/15 0600 06/22/15 0630 06/22/15 0700 06/22/15 0830  BP: 100/59 110/66 121/64   Pulse: 98 94 96   Temp:    98.8 F (37.1 C)  TempSrc:    Oral  Resp: 20 19 21 18   Height:    5\' 3"  (1.6 m)  Weight:    92 kg (202 lb 13.2 oz)  SpO2: 93% 94% 95% 98%    Intake/Output Summary (Last 24 hours) at 06/22/15 1916 Last data filed at 06/22/15 1730  Gross per 24 hour  Intake 4615.42 ml  Output   2350 ml  Net 2265.42 ml    Exam:   General:  Pt is  alert, follows commands appropriately, not in acute distress  Cardiovascular: Regular rate and rhythm, no rubs, no gallops  Respiratory: Clear to auscultation bilaterally, no wheezing, no crackles, no rhonchi  Abdomen: Soft, non tender, non distended, bowel sounds present, no guarding  Data Reviewed: Basic Metabolic Panel:  Recent Labs Lab 06/16/15 0345 06/17/15 0400 06/18/15 0338 06/21/15 0741 06/21/15 1239 06/22/15 0406  NA 138 137 140 134*  --  136  K 3.7 3.3* 3.6 3.5  --  3.6  CL 102 98* 102 95*  --  104  CO2 26 27 27 23   --  22  GLUCOSE 89 108* 82 120*  --  96  BUN 9 12 10  35*  --  33*  CREATININE 0.91 0.94 0.98 5.91*  --  4.29*  CALCIUM 9.1 8.9 8.8* 9.0  --  7.9*  MG  --   --  1.8  --  1.7  --    Liver Function Tests:  Recent Labs Lab 06/21/15 0741 06/22/15 0406  AST 16 13*  ALT 22 14  ALKPHOS 119 87  BILITOT 1.2 0.8  PROT 7.8 6.0*  ALBUMIN 3.6 2.5*    Recent Labs Lab 06/21/15 0741  LIPASE 85*    CBC:  Recent Labs Lab 06/17/15 0400 06/18/15 0338 06/21/15 0741 06/21/15 1239 06/22/15 0406  WBC 11.6* 10.7* 12.7* 11.1* 8.2  NEUTROABS  --   --  11.1*  --   --   HGB 9.3* 8.7* 9.7* 8.8* 7.8*  HCT 28.2* 26.2* 30.3* 26.0* 23.3*  MCV 86.8 85.9 85.4 83.9 85.0  PLT 507* 472* 522* 370 352   Recent Results (from the past 240 hour(s))  Urine culture     Status: None   Collection Time: 06/21/15  1:16 PM  Result Value Ref Range Status   Specimen Description URINE, CATHETERIZED  Final   Special Requests NONE  Final   Culture   Final    NO GROWTH 1 DAY Performed at [facility]    Report Status 06/22/2015 FINAL  Final  C difficile quick scan w PCR reflex     Status: None   Collection Time: 06/21/15  7:21 PM  Result Value Ref Range Status   C Diff antigen NEGATIVE NEGATIVE Final   C Diff toxin NEGATIVE NEGATIVE Final   C Diff interpretation Negative for toxigenic C. difficile  Final  Gastrointestinal Panel by PCR , Stool     Status: None    Collection Time: 06/21/15  7:21 PM  Result Value Ref Range Status   Campylobacter species NOT DETECTED NOT DETECTED Final   Plesimonas shigelloides NOT DETECTED NOT DETECTED Final   Salmonella species NOT DETECTED NOT DETECTED Final   Yersinia enterocolitica NOT DETECTED NOT DETECTED Final   Vibrio species NOT DETECTED NOT DETECTED Final   Vibrio cholerae NOT DETECTED NOT DETECTED Final   Enteroaggregative E coli (EAEC) NOT DETECTED NOT DETECTED Final   Enteropathogenic E coli (EPEC) NOT DETECTED NOT DETECTED Final   Enterotoxigenic E coli (ETEC) NOT DETECTED NOT DETECTED Final   Shiga like toxin producing E coli (STEC) NOT DETECTED NOT DETECTED Final   E. coli O157 NOT DETECTED NOT DETECTED Final   Shigella/Enteroinvasive E coli (EIEC) NOT DETECTED NOT DETECTED Final   Cryptosporidium NOT DETECTED NOT DETECTED Final   Cyclospora cayetanensis NOT DETECTED NOT DETECTED Final   Entamoeba histolytica NOT DETECTED NOT DETECTED Final   Giardia lamblia NOT DETECTED NOT DETECTED Final   Adenovirus F40/41 NOT DETECTED NOT DETECTED Final   Astrovirus NOT DETECTED NOT DETECTED Final   Norovirus GI/GII NOT DETECTED NOT DETECTED Final   Rotavirus A NOT DETECTED NOT DETECTED Final   Sapovirus (I, II, IV, and V) NOT DETECTED NOT DETECTED Final     Scheduled Meds: . aspirin  81 mg Oral QHS  . enoxaparin (LOVENOX) injection  1 mg/kg Subcutaneous Q24H  . feeding supplement  1 Container Oral BID BM  . feeding supplement (ENSURE ENLIVE)  237 mL Oral Q24H  . ferrous sulfate  325 mg Oral Daily  . lip balm  1 application Topical BID  . sodium chloride flush  3 mL Intravenous Q12H   Continuous Infusions: . sodium chloride 150 mL/hr at 06/22/15 1721

## 2015-06-23 DIAGNOSIS — E876 Hypokalemia: Secondary | ICD-10-CM | POA: Diagnosis present

## 2015-06-23 DIAGNOSIS — E43 Unspecified severe protein-calorie malnutrition: Secondary | ICD-10-CM | POA: Diagnosis present

## 2015-06-23 LAB — CBC
HEMATOCRIT: 23.7 % — AB (ref 36.0–46.0)
Hemoglobin: 7.8 g/dL — ABNORMAL LOW (ref 12.0–15.0)
MCH: 28 pg (ref 26.0–34.0)
MCHC: 32.9 g/dL (ref 30.0–36.0)
MCV: 84.9 fL (ref 78.0–100.0)
PLATELETS: 309 10*3/uL (ref 150–400)
RBC: 2.79 MIL/uL — AB (ref 3.87–5.11)
RDW: 15.1 % (ref 11.5–15.5)
WBC: 5.6 10*3/uL (ref 4.0–10.5)

## 2015-06-23 LAB — BASIC METABOLIC PANEL
ANION GAP: 9 (ref 5–15)
BUN: 21 mg/dL — ABNORMAL HIGH (ref 6–20)
CALCIUM: 8.2 mg/dL — AB (ref 8.9–10.3)
CO2: 25 mmol/L (ref 22–32)
Chloride: 107 mmol/L (ref 101–111)
Creatinine, Ser: 1.82 mg/dL — ABNORMAL HIGH (ref 0.44–1.00)
GFR, EST AFRICAN AMERICAN: 32 mL/min — AB (ref >60)
GFR, EST NON AFRICAN AMERICAN: 28 mL/min — AB (ref >60)
GLUCOSE: 100 mg/dL — AB (ref 65–99)
POTASSIUM: 3.4 mmol/L — AB (ref 3.5–5.1)
Sodium: 141 mmol/L (ref 135–145)

## 2015-06-23 MED ORDER — FERROUS SULFATE 325 (65 FE) MG PO TABS
325.0000 mg | ORAL_TABLET | Freq: Two times a day (BID) | ORAL | Status: DC
  Administered 2015-06-23 – 2015-06-28 (×10): 325 mg via ORAL
  Filled 2015-06-23 (×14): qty 1

## 2015-06-23 MED ORDER — POTASSIUM CHLORIDE CRYS ER 20 MEQ PO TBCR
40.0000 meq | EXTENDED_RELEASE_TABLET | Freq: Once | ORAL | Status: AC
  Administered 2015-06-23: 40 meq via ORAL
  Filled 2015-06-23: qty 2

## 2015-06-23 MED ORDER — SODIUM CHLORIDE 0.9% FLUSH
10.0000 mL | Freq: Two times a day (BID) | INTRAVENOUS | Status: DC
  Administered 2015-06-25 – 2015-06-26 (×2): 10 mL
  Administered 2015-06-29: 20 mL
  Administered 2015-06-29 – 2015-07-06 (×11): 10 mL

## 2015-06-23 MED ORDER — SODIUM CHLORIDE 0.9% FLUSH
10.0000 mL | INTRAVENOUS | Status: DC | PRN
  Administered 2015-06-24: 10 mL
  Filled 2015-06-23: qty 40

## 2015-06-23 NOTE — Clinical Documentation Improvement (Signed)
Internal Medicine  Nutritional Consult was written after your note yesterday (11:19 AM) documenting Severe Malnutrition. Moderate Malnutrition is documented by yourself at 7:16 AM. Please assess Consult and render an opinion in next progress note clarifying the degree of Malnutrition. Do not document findings in BPA drop down box.    Document Severity - Severe(third degree), Moderate (second degree), Mild (first degree)  Other condition  Unable to clinically determine  Document any associated diagnoses/conditions  Supporting Information: :  - Will order Ensure Enlive once/day, this supplement provides 350 kcal and 20 grams of protein  - Will order Boost Breeze BID, each supplement provides 250 kcal and 9 grams of protein  - Recommend diet texture adjustment (to regular) if medically feasible   Please exercise your independent, professional judgment when responding. A specific answer is not anticipated or expected.  Thank You, Zoila Shutter RN, BSN, [place] [phone]; Cell: [phone]

## 2015-06-23 NOTE — Progress Notes (Signed)
[facility]  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 Resection of cystic mass near small bowel with serosal repair.  Surgeon(s): Karie Soda, MD Romie Levee, MD - Assist   Diagnosis 1. Soft tissue, biopsy, abdominal wall cyst ? fistula BENIGN CYST AND REACTIVE CHANGES NEGATIVE FOR MALIGNANCY 2. Soft tissue mass, simple excision, Pelvic GIST GASTROINTESTINAL STROMAL TUMORS OF THE RECTUM SHOWING TREATMENT EFFECT (9.5 CM) MARGIN OF RESECTION IS POSITIVE 3. Colon, segmental resection for tumor, rectum DIVERTICULA NO RESIDUAL GASTROINTESTINAL STROMAL TUMOR IDENTIFIED MARGINS OF RESECTION ARE NEGATIVE FIFTEEN BENIGN LYMPH NODES (0/15) Microscopic Comment 2. GASTROINTESTINAL STROMAL TUMOR (GIST): Procedure: segmental resection Tumor Site: rectum Specify: pelvis rectum Tumor Size: 9.5 cm Tumor Focality Unifocal: focal GIST Subtype (spindle, epithelioid, mixed, etc.): spindle Mitotic Rate: 1 /50 HIGH POWER FIELD Necrosis: negative Histologic Grade 1 G1: Low grade; mitotic rate 5/50 HIGH POWER FIELD. G2: High grade; mitotic rate >5/50 HIGH POWER FIELD. Risk Assessment: _ Margins: positive Ancillary testing: Per request 1 of 3 FINAL for AKITA, MAXIM [phone]) Microscopic  Comment(continued) Lymph nodes: number examined: 15; number positive: 0 Pathologic Staging: pT3 pN0 Comments: The tumor shows treatment effect with hypocellular, atrophic spindle cells and hyalinized, fibrotic stroma. less than 1% of the tumor without treatment effect. Marlena Clipper MD Pathologist, Electronic Signature (Case signed 06/10/2015) Specimen Nam Vossler and Clinical Information Specimen(s) Obtained: 1. Soft tissue, biopsy, abdominal wall cyst ? fistula 2. Soft tissue mass, simple excision, Pelvic GIST 3. Colon, segmental resection for tumor, rectum Specimen Clinical Information 1. GIST tumor of rectum in pelvis (kp) Lenoard Helbert 1. In formalin is a a 3.1 x 1.4 x 0.3 cm irregular portion of fatty tissue, which on sectioning contains a 0.5 cm collapsed smooth cystic space. Sectioned and entirely submitted in one block. 2. Received in formalin is a 176 gram, 9.5 x 6.8 x 5.8 cm rubbery to firm mass, which has an attached suture over a 2.1 x 1.5 cm roughened area, clinically identifying anterior distal. This area is inked orange. There is also a 5.0 x 4.2 cm disrupted area, which is clinically identified as disrupted margin. This area is inked black. Also along one aspect is a 3.4 x 3.3 cm area of tan to hyperemic possible smooth mucosa. The cut surfaces of the mass are tan to tan yellow, with vaguely whorled cut surfaces, and scattered myxoid areas. Representative sections are submitted as follows: A, B = sections from clinically disrupted margin C = sections from area identified by suture as anterior distal D, E = central sections Total: 5 blocks submitted 3. Received in formalin is a 27 cm in length segment of colon, clinically recto-sigmoid, which includes at least probable middle third of rectum. Surrounding mesorectum is incomplete and focally disrupted. There is an attached suture over the anterior perirectal surface. The mucosa is tan pink, smooth, soft with normal intestinal folds.  There are  no mucosal lesions identified. Within the sigmoid portion are several unperforated diverticula. Found within the surrounding fat are 15 possible lymph nodes ranging from 0.1 to 0.6 cm. Block Summary: A = proximal margin B, C = distal margin D, E = sections taken at attached suture over anterior mesorectum F = diverticulum G = four nodes, whole H = four nodes, whole I = four nodes, whole J = three nodes, whole Total: 10 blocks submitted (SW:ds 06/07/15) 2 of 3 FINAL for FALINE, LANGER ([id number]) Report signed out from the following location(s) Technical component and interpretation was performed at [facility], [place], [place] [address]. CLIA #: [id number], 3 of 3 Assessment  RECURRENT DEHYDRATION   Plan:  -IVF for ARF/dehydration - improving  -STRICT I&O  -Increase PO - pt feels much better (but she has said that before) - get cal counts  -PRN nausea control.    -no stricture at ostomy - no evid by CT & higher output argues against this but N/V concerning  -OSTOMY CARE / TRAINING  -antidiarrheal regimen as tolerated to avoid dehydration.  Inc iron  -VTE prophylaxis- SCDs, etc  -GERD - PPI  -hyperlipidemia - Tx  -HTN  - no ACE Inh  -h/o PE - inc ASA.  Restart Eliquis as tolerated.  Monitor with ARF  -mobilize as tolerated to help recovery  -Pathology - ?+margin most likely fracture of GIST exposed.  Most likely will get gleevec post-adjuvant for 3 years total per Dr Benay Spice.    Adin [name], M.D., F.A.C.S. Gastrointestinal and Minimally Invasive Surgery [facility] [address] [place], [place] [address] [phone] Main / Paging   06/23/2015  Subjective:  "I feel better!" Walking a little No anorectal pain No pain at ileostomy  Exptying ileostomy bag, esp gas    Objective:  Vital signs:  Filed Vitals:   06/22/15 0700 06/22/15 0830 06/22/15 2131 06/23/15  0619  BP: 121/64  144/63 146/68  Pulse: 96  100 101  Temp:  98.8 F (37.1 C) 99.3 F (37.4 C) 98.4 F (36.9 C)  TempSrc:  Oral Oral Oral  Resp: _0 Height:  _1  (1.6 m)    Weight:  92 kg (202 lb 13.2 oz)    SpO2: 95% 98% 99% 99%    Last BM Date: 06/22/15  Intake/Output   Yesterday:  02/21 0701 - 02/22 0700 In: 6231.7 [I.V.:5231.7; IV Piggyback:1000] Out: 4500 [Urine:3325; Stool:800] This shift:     Bowel function:  Flatus: YES  BM: thick effluent in bag.   Drain: n/a  Physical Exam:  General: Pt awake/alert/oriented x4 in no acute distress.  Smiling, alert Eyes: PERRL, normal EOM.  Sclera clear.  No icterus Neuro: CN II-XII intact w/o focal sensory/motor deficits. Lymph: No head/neck/groin lymphadenopathy Psych:  No delerium/psychosis/paranoia HENT: Normocephalic, Mucus membranes moist.  No thrush Neck: Supple, No tracheal deviation Chest: No chest wall pain w good excursion CV:  Pulses intact.  Regular rhythm MS: Normal AROM mjr joints.  No obvious deformity Abdomen: Soft but morbidly obese.  Nondistended.  Nontender.  No fluctuance/cellultis.  No guarding.  No evidence of peritonitis.  No incarcerated hernias.  Loop ileostomy pink with moderate thick effluent out superior orifice.  itubates easily w mild kink with a 24Fr Robinson catheter.  No necrosis.  Moderate skin separation Rectal.  Decreased sphincter tone.  Stitches intact Ext:  SCDs BLE.  No mjr edema.  No cyanosis Skin:  No petechiae / purpura  Results:   Labs: Results for orders placed or performed during the hospital encounter of 06/21/15 (from the past 48 hour(s))  I-Stat CG4 Lactic Acid, ED     Status: None   Collection Time: 06/21/15  8:00 AM  Result Value Ref Range   Lactic Acid, Venous 1.13 0.5 - 2.0 mmol/L  I-Stat CG4 Lactic Acid, ED     Status: None   Collection Time: 06/21/15 11:08 AM  Result Value Ref Range   Lactic Acid, Venous 0.57 0.5 - 2.0 mmol/L  CBC     Status: Abnormal    Collection Time: 06/21/15 12:39 PM  Result Value Ref Range   WBC 11.1 (H) 4.0 - 10.5 K/uL   RBC 3.10 (L) 3.87 - 5.11 MIL/uL   Hemoglobin 8.8 (L) 12.0 - 15.0 g/dL   HCT 26.0 (L) 36.0 - 46.0 %   MCV 83.9 78.0 - 100.0 fL   MCH 28.4 26.0 - 34.0 pg   MCHC 33.8 30.0 - 36.0 g/dL   RDW 15.2 11.5 - 15.5 %   Platelets 370 150 - 400 K/uL  Magnesium     Status: None   Collection Time: 06/21/15 12:39 PM  Result Value Ref Range   Magnesium 1.7 1.7 - 2.4 mg/dL  TSH     Status: Abnormal   Collection Time: 06/21/15 12:39 PM  Result Value Ref Range   TSH 0.102 (L) 0.350 - 4.500 uIU/mL  T4, free     Status: Abnormal   Collection Time: 06/21/15 12:39 PM  Result Value Ref Range   Free T4 1.35 (H) 0.61 - 1.12 ng/dL    Comment: Performed at [facility]  Urinalysis, Routine w reflex microscopic (not at [facility])     Status: Abnormal   Collection Time: 06/21/15  1:16 PM  Result Value Ref Range   Color, Urine AMBER (A) YELLOW    Comment: BIOCHEMICALS MAY BE AFFECTED BY COLOR   APPearance CLOUDY (A) CLEAR   Specific Gravity, Urine 1.016 1.005 - 1.030   pH 5.0 5.0 - 8.0   Glucose, UA NEGATIVE NEGATIVE mg/dL   Hgb urine dipstick NEGATIVE NEGATIVE   Bilirubin Urine MODERATE (A) NEGATIVE   Ketones, ur NEGATIVE NEGATIVE mg/dL   Protein, ur NEGATIVE NEGATIVE mg/dL   Nitrite NEGATIVE NEGATIVE   Leukocytes, UA NEGATIVE NEGATIVE    Comment: MICROSCOPIC NOT DONE ON URINES WITH NEGATIVE PROTEIN, BLOOD, LEUKOCYTES, NITRITE, OR GLUCOSE <1000 mg/dL.  Urine culture     Status: None   Collection Time: 06/21/15  1:16 PM  Result Value Ref Range   Specimen Description URINE, CATHETERIZED    Special Requests NONE    Culture      NO GROWTH 1 DAY Performed at [facility]    Report Status 06/22/2015 FINAL   Blood gas, arterial     Status: Abnormal   Collection Time: 06/21/15  2:34 PM  Result Value Ref Range   FIO2 0.21    pH, Arterial 7.401 7.350 - 7.450   pCO2 arterial 31.7 (L) 35.0 - 45.0 mmHg    pO2, Arterial 83.5 80.0 - 100.0 mmHg   Bicarbonate 19.3 (L) 20.0 - 24.0 mEq/L   TCO2 18.0 0 - 100 mmol/L   Acid-base deficit 4.3 (H) 0.0 - 2.0 mmol/L   O2 Saturation 95.6 %   Patient temperature 98.6    Collection site LEFT RADIAL    Drawn by [id number]    Sample type ARTERIAL DRAW    Allens test (  pass/fail) PASS PASS  C difficile quick scan w PCR reflex     Status: None   Collection Time: 06/21/15  7:21 PM  Result Value Ref Range   C Diff antigen NEGATIVE NEGATIVE   C Diff toxin NEGATIVE NEGATIVE   C Diff interpretation Negative for toxigenic C. difficile   Gastrointestinal Panel by PCR , Stool     Status: None   Collection Time: 06/21/15  7:21 PM  Result Value Ref Range   Campylobacter species NOT DETECTED NOT DETECTED   Plesimonas shigelloides NOT DETECTED NOT DETECTED   Salmonella species NOT DETECTED NOT DETECTED   Yersinia enterocolitica NOT DETECTED NOT DETECTED   Vibrio species NOT DETECTED NOT DETECTED   Vibrio cholerae NOT DETECTED NOT DETECTED   Enteroaggregative E coli (EAEC) NOT DETECTED NOT DETECTED   Enteropathogenic E coli (EPEC) NOT DETECTED NOT DETECTED   Enterotoxigenic E coli (ETEC) NOT DETECTED NOT DETECTED   Shiga like toxin producing E coli (STEC) NOT DETECTED NOT DETECTED   E. coli O157 NOT DETECTED NOT DETECTED   Shigella/Enteroinvasive E coli (EIEC) NOT DETECTED NOT DETECTED   Cryptosporidium NOT DETECTED NOT DETECTED   Cyclospora cayetanensis NOT DETECTED NOT DETECTED   Entamoeba histolytica NOT DETECTED NOT DETECTED   Giardia lamblia NOT DETECTED NOT DETECTED   Adenovirus F40/41 NOT DETECTED NOT DETECTED   Astrovirus NOT DETECTED NOT DETECTED   Norovirus GI/GII NOT DETECTED NOT DETECTED   Rotavirus A NOT DETECTED NOT DETECTED   Sapovirus (I, II, IV, and V) NOT DETECTED NOT DETECTED  Comprehensive metabolic panel     Status: Abnormal   Collection Time: 06/22/15  4:06 AM  Result Value Ref Range   Sodium 136 135 - 145 mmol/L   Potassium 3.6 3.5 -  5.1 mmol/L   Chloride 104 101 - 111 mmol/L   CO2 22 22 - 32 mmol/L   Glucose, Bld 96 65 - 99 mg/dL   BUN 33 (H) 6 - 20 mg/dL   Creatinine, Ser 4.29 (H) 0.44 - 1.00 mg/dL   Calcium 7.9 (L) 8.9 - 10.3 mg/dL   Total Protein 6.0 (L) 6.5 - 8.1 g/dL   Albumin 2.5 (L) 3.5 - 5.0 g/dL   AST 13 (L) 15 - 41 U/L   ALT 14 14 - 54 U/L   Alkaline Phosphatase 87 38 - 126 U/L   Total Bilirubin 0.8 0.3 - 1.2 mg/dL   GFR calc non Af Amer 10 (L) >60 mL/min   GFR calc Af Amer 11 (L) >60 mL/min    Comment: (NOTE) The eGFR has been calculated using the CKD EPI equation. This calculation has not been validated in all clinical situations. eGFR's persistently <60 mL/min signify possible Chronic Kidney Disease.    Anion gap 10 5 - 15  CBC     Status: Abnormal   Collection Time: 06/22/15  4:06 AM  Result Value Ref Range   WBC 8.2 4.0 - 10.5 K/uL   RBC 2.74 (L) 3.87 - 5.11 MIL/uL   Hemoglobin 7.8 (L) 12.0 - 15.0 g/dL   HCT 23.3 (L) 36.0 - 46.0 %   MCV 85.0 78.0 - 100.0 fL   MCH 28.5 26.0 - 34.0 pg   MCHC 33.5 30.0 - 36.0 g/dL   RDW 15.5 11.5 - 15.5 %   Platelets 352 150 - 400 K/uL  CBC     Status: Abnormal   Collection Time: 06/23/15  7:20 AM  Result Value Ref Range   WBC 5.6 4.0 - 10.5  K/uL   RBC 2.79 (L) 3.87 - 5.11 MIL/uL   Hemoglobin 7.8 (L) 12.0 - 15.0 g/dL   HCT 23.7 (L) 36.0 - 46.0 %   MCV 84.9 78.0 - 100.0 fL   MCH 28.0 26.0 - 34.0 pg   MCHC 32.9 30.0 - 36.0 g/dL   RDW 15.1 11.5 - 15.5 %   Platelets 309 150 - 400 K/uL      Imaging / Studies: Ct Abdomen Pelvis Wo Contrast  06/21/2015  CLINICAL DATA:  Nausea and vomiting for the past 4 days. Status post GIST tumor removal on 06/04/2015. EXAM: CT ABDOMEN AND PELVIS WITHOUT CONTRAST TECHNIQUE: Multidetector CT imaging of the abdomen and pelvis was performed following the standard protocol without IV contrast. COMPARISON:  06/17/2015. FINDINGS: Lower chest: 3 mm left lower lobe nodule on image number 6. This has not changed significantly  since 07/22/2014 and was not included on other previous examinations. Hepatobiliary: Cholecystectomy clips.  Unremarkable liver. Pancreas: No mass or inflammatory process identified on this un-enhanced exam. Spleen: Within normal limits in size. Adrenals/Urinary Tract: No evidence of urolithiasis or hydronephrosis. No definite mass visualized on this un-enhanced exam. Stomach/Bowel: Right lower quadrant anterior ileostomy with passage of oral contrast into the ostomy bag. Decreased soft tissue stranding and fluid in the surrounding subcutaneous fat. Soft tissue stranding mower lateral to that location is unchanged. Bilateral subcutaneous air is again demonstrated. Mildly prominent loops of jejunum with improvement. This transitions to normal caliber small bowel in the left mid abdomen, best seen on coronal image number 36. Small hiatal hernia. Scattered colonic diverticula. No evidence of appendicitis. Vascular/Lymphatic: Atheromatous coronary artery calcifications. No enlarged lymph nodes. Reproductive: Surgically absent uterus.  No adnexal masses. Other: Mild perirectal and presacral soft tissue thickening and edema and soft tissue air. Minimal additional free peritoneal air. Stable large postoperative seroma in the anterior pelvic wall, with some soft tissue density components. This measures 13.1 x 7.9 cm on image number 63. Musculoskeletal: Right hip total prosthesis. Left iliac bone island. Lumbar and lower thoracic spine degenerative changes and mild scoliosis IMPRESSION: 1. Stable postsurgical changes, as described above. 2. Mildly improved postsurgical jejunal ileus or partial obstruction, with transition in the left mid abdomen. This could be an indication of an adhesion. 3. Small hiatal hernia. 4. Colonic diverticulosis. Electronically Signed   By: Claudie Revering M.D.   On: 06/21/2015 11:19   Dg Chest Portable 1 View  06/21/2015  CLINICAL DATA:  Nausea and vomiting since Saturday with GI stromal tumor of  rectum. EXAM: PORTABLE CHEST 1 VIEW COMPARISON:  06/21/2015, earlier the same day FINDINGS: 1827 hours. Lung volumes are low without focal airspace consolidation or pulmonary edema. No evidence for pleural effusion. Cardiopericardial silhouette is at upper limits of normal for size. Right PICC line tip overlies the mid SVC. Telemetry leads overlie the chest. IMPRESSION: Stable.  No acute findings. Electronically Signed   By: Misty Stanley M.D.   On: 06/21/2015 18:50   Dg Chest Port 1 View  06/21/2015  CLINICAL DATA:  N/V today; Pt recently discharged on 2/17 after a gastrointestinal stroma tumor of distal rectum removal.; HTN; hx PE EXAM: PORTABLE CHEST - 1 VIEW COMPARISON:  07/13/2015 FINDINGS: Lungs are clear. Heart size and mediastinal contours are within normal limits. No effusion. Visualized skeletal structures are unremarkable. Stable right arm PICC, tip low SVC. Surgical clips, right upper abdomen. IMPRESSION: No acute cardiopulmonary disease. Electronically Signed   By: Lucrezia Europe M.D.   On: 06/21/2015 12:43  Medications / Allergies: per chart  Antibiotics: Anti-infectives    None        Note: Portions of this report may have been transcribed using voice recognition software. Every effort was made to ensure accuracy; however, inadvertent computerized transcription errors may be present.   Any transcriptional errors that result from this process are unintentional.     Adin [name], M.D., F.A.C.S. Gastrointestinal and Minimally Invasive Surgery [facility] [address] [place], [place] [address] [phone] Main / Paging   06/23/2015  CARE TEAM:  PCP: Cathlean Cower, MD  Outpatient Care Team: Patient Care Team: Biagio Borg, MD as PCP - General Michael Boston, MD as Consulting Physician (General Surgery) Ladell Pier, MD as Consulting Physician (Oncology) Milus Banister, MD as Consulting Physician (Gastroenterology) Billy Fischer,  MD as Consulting Physician (Family Medicine)  Inpatient Treatment Team: Treatment Team: Attending Provider: Theodis Blaze, MD; Consulting Physician: Michael Boston, MD; Rounding Team: Ian Bushman, MD; Technician: Geoffery Spruce, NT; Registered Nurse: Albesa Seen Corridon, RN; Technician: Guadalupe Maple, NT; Registered Nurse: Saunders Glance, RN

## 2015-06-23 NOTE — Progress Notes (Addendum)
Patient ID: Brianna Walker, female   DOB: Jun 01, 1945, 71 y.o.   MRN: [id number]   [facility] PROGRESS NOTE  BRYCEN MAZZA [id number] DOB: April 28, 1946 DOA: 06/21/2015 PCP: Cathlean Cower, MD   Brief narrative:    70 year old female status post laparoscopic and robotic resection of GIST TUMOR lysis of adhesions on 2/3, discharged on 2/17, presented to the ER for evaluation of nausea vomiting, high ileostomy output, poor appetite and subjective fevers. General surgery consulted due to recent surgery. CT abdomen pelvis shows postsurgical changes, postsurgical ileus vs partial small bowel obstruction, small hiatal hernia. Chest x-ray negative. Creatinine has increased from 0.98> 5.91. Patient being admitted to the hospitalist service for further evaluation of her dehydration and renal failure.  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  Family Communication:  plan of care discussed with the patient Disposition Plan: Discharge by 06/24/2015  IV access:  Peripheral IV  Procedures and diagnostic studies:    Ct Abdomen Pelvis Wo Contrast 06/21/2015 Mildly improved postsurgical jejunal ileus or partial  obstruction, with transition in the left mid abdomen. This could be an indication of an adhesion.   Dg Chest Portable 1 View 06/21/2015 Stable.  No acute findings.   Dg Chest Port 1 View 06/21/2015  No acute cardiopulmonary disease.  Medical Consultants:  Surgery  Other Consultants:  None   IAnti-Infectives:   None   Faye Ramsay, MD  [facility] Pager [phone]  If 7PM-7AM, please contact night-coverage [email or web address] Password [facility] 06/23/2015, 11:50 AM   LOS: 2 days   HPI/Subjective: No events overnight. Reports feeling much better this AM, wants diet advanced.   Objective: Filed Vitals:   06/22/15 0700 06/22/15 0830 06/22/15 2131 06/23/15 0619  BP: 121/64  144/63 146/68  Pulse: 96  100 101  Temp:  98.8 F (37.1 C) 99.3 F (37.4 C) 98.4 F (36.9 C)  TempSrc:  Oral Oral Oral  Resp: 21 18 18 18   Height:  5\' 3"  (1.6 m)    Weight:  92 kg (202 lb 13.2 oz)    SpO2: 95% 98% 99% 99%    Intake/Output Summary (Last 24 hours) at 06/23/15 1150 Last data filed at 06/23/15 1110  Gross per 24 hour  Intake 4231.67 ml  Output   3700 ml  Net 531.67 ml    Exam:   General:  Pt is alert, follows commands appropriately, not in acute distress  Cardiovascular: Regular rate and rhythm, no rubs, no gallops  Respiratory: Clear to auscultation bilaterally, no wheezing, no crackles, no rhonchi  Abdomen: Soft, non tender, non distended, bowel sounds present, no guarding  Data Reviewed: Basic Metabolic Panel:  Recent Labs Lab 06/17/15 0400 06/18/15 0338 06/21/15 0741  06/21/15 1239 06/22/15 0406 06/23/15 0720  NA 137 140 134*  --  136 141  K 3.3* 3.6 3.5  --  3.6 3.4*  CL 98* 102 95*  --  104 107  CO2 27 27 23   --  22 25  GLUCOSE 108* 82 120*  --  96 100*  BUN 12 10 35*  --  33* 21*  CREATININE 0.94 0.98 5.91*  --  4.29* 1.82*  CALCIUM 8.9 8.8* 9.0  --  7.9* 8.2*  MG  --  1.8  --  1.7  --   --    Liver Function Tests:  Recent Labs Lab 06/21/15 0741 06/22/15 0406   AST 16 13*  ALT 22 14  ALKPHOS 119 87  BILITOT 1.2 0.8  PROT 7.8 6.0*  ALBUMIN 3.6 2.5*    Recent Labs Lab 06/21/15 0741  LIPASE 85*    CBC:  Recent Labs Lab 06/18/15 0338 06/21/15 0741 06/21/15 1239 06/22/15 0406 06/23/15 0720  WBC 10.7* 12.7* 11.1* 8.2 5.6  NEUTROABS  --  11.1*  --   --   --   HGB 8.7* 9.7* 8.8* 7.8* 7.8*  HCT 26.2* 30.3* 26.0* 23.3* 23.7*  MCV 85.9 85.4 83.9 85.0 84.9  PLT 472* 522* 370 352 309   Recent Results (from the past 240 hour(s))  Urine culture     Status: None   Collection Time: 06/21/15  1:16 PM  Result Value Ref Range Status   Specimen Description URINE, CATHETERIZED  Final   Special Requests NONE  Final   Culture   Final    NO GROWTH 1 DAY Performed at [facility]    Report Status 06/22/2015 FINAL  Final  C difficile quick scan w PCR reflex     Status: None   Collection Time: 06/21/15  7:21 PM  Result Value Ref Range Status   C Diff antigen NEGATIVE NEGATIVE Final   C Diff toxin NEGATIVE NEGATIVE Final   C Diff interpretation Negative for toxigenic C. difficile  Final  Gastrointestinal Panel by PCR , Stool     Status: None   Collection Time: 06/21/15  7:21 PM  Result Value Ref Range Status   Campylobacter species NOT DETECTED NOT DETECTED Final   Plesimonas shigelloides NOT DETECTED NOT DETECTED Final   Salmonella species NOT DETECTED NOT DETECTED Final   Yersinia enterocolitica NOT DETECTED NOT DETECTED Final   Vibrio species NOT DETECTED NOT DETECTED Final   Vibrio cholerae NOT DETECTED NOT DETECTED Final   Enteroaggregative E coli (EAEC) NOT DETECTED NOT DETECTED Final   Enteropathogenic E coli (EPEC) NOT DETECTED NOT DETECTED Final   Enterotoxigenic E coli (ETEC) NOT DETECTED NOT DETECTED Final   Shiga like toxin producing E coli (STEC) NOT DETECTED NOT DETECTED Final   E. coli O157 NOT DETECTED NOT DETECTED Final   Shigella/Enteroinvasive E coli (EIEC) NOT DETECTED NOT DETECTED Final   Cryptosporidium NOT  DETECTED NOT DETECTED Final   Cyclospora cayetanensis NOT DETECTED NOT DETECTED Final   Entamoeba histolytica NOT DETECTED NOT DETECTED Final   Giardia lamblia NOT DETECTED NOT DETECTED Final   Adenovirus F40/41 NOT DETECTED NOT DETECTED Final   Astrovirus NOT DETECTED NOT DETECTED Final   Norovirus GI/GII NOT DETECTED NOT DETECTED Final   Rotavirus A NOT DETECTED NOT DETECTED Final   Sapovirus (I, II, IV, and V) NOT DETECTED NOT DETECTED Final     Scheduled Meds: . aspirin  81 mg Oral QHS  . enoxaparin (LOVENOX) injection  1 mg/kg Subcutaneous Q24H  . ferrous sulfate  325 mg Oral Daily  . lip balm  1 application Topical BID  . potassium chloride  40 mEq Oral Once  . sodium chloride flush  3 mL Intravenous Q12H   Continuous Infusions: . sodium chloride 75 mL/hr at 06/23/15 0153

## 2015-06-23 NOTE — Progress Notes (Signed)
NUTRITION NOTE  Pt seen for full assessment yesterday (2/21). Follow-up today for 48 calorie count. No meal tickets were saved from meals yesterday or breakfast this AM. Diet advanced from Dysphagia 1, thin liquids to Heart Healthy, thin liquids yesterday at 1609 and liberalized this AM at 0857 to Regular.  Spoke with RN who reports pt consumed ~50% of breakfast this AM and that pt refused peach-flavored Boost Breeze but did accept berry-flavored Boost Breeze. RN states she will retain meal tickets from remainder of meals today and inform tech of the same.   Pt states that she consumed part of a muffin and tea for breakfast this AM. She is appreciative of diet change since yesterday. She denies abdominal pain or nausea with intakes this AM but states she did not want any more for breakfast. Encouraged pt to consume what she is able to for lunch and dinner. Discussed supplements with her. She states that she does not like the [place] and requests it be d/c'ed. She has had Ensure in the past and states it causes abdominal bloating and gas and that she does not want to receive it during admission. Pt refuses all supplements offered.  Not meeting needs. Will follow-up 2/23 for results of day 2 of calorie count. MD not yesterday indicated pt to d/ by 2/23.    Brianna Walker, RD, LDN Inpatient Clinical Dietitian Pager # [phone] After hours/weekend pager # [phone]

## 2015-06-23 NOTE — Progress Notes (Signed)
NT reported that pt vomited after taking potassium pills, stated she saw undigested pills in emesis

## 2015-06-23 NOTE — Progress Notes (Signed)
[facility]  Patient Status: Active (receiving services up to time of hospitalization)  [facility] is providing the following services: RN  If patient discharges after hours, please call [phone].   Brianna Walker 06/23/2015, 12:26 PM

## 2015-06-23 NOTE — Consult Note (Signed)
WOC ostomy follow up Stoma type/location: RLQ loop ileostomy Stomal assessment/size: 1 and 1/8 inches, red, moist, budded. Proximal limb at 11 o'clock, distal limb at 7 o'clock.  Mucocutaneous separation from 12-4 o'clock and extending 3.5cm medially from ostomy.  Depth not known as patient is exquisitely tender in this area and does not tolerate exploration. Peristomal assessment: See above for parastomal defect Treatment options for stomal/peristomal skin: See below. (Fill defect to skin level with pectin powder, top with pectin strips and an additional ring around stoma, then pouch with convex pouching system.) Output: thin, dark brown effluent with strong odor.  Ostomy pouching: 1pc.convex pouch Kellie Simmering [phone]), skin barrier rings-2 Kellie Simmering [phone]) and [employer] pectin powder Kellie Simmering # 6).  Pouching regimen:  Parastomal defect filled to skin level with pectin based powder (Adapt).  One skin barrier ring cut in half and applied as two "strips" over the powder-filled defect.  One skin barrier ring stretched to fit around stoma and applied to skin.  1-piece convex pouch cut-to-fit size and shape of loop ileotomy and applied to skin.  Hand held over pouching system, particularly the area of the parastomal wound and ostomy, for gentle warming hand pressure to enhance pouch seal for several minutes.  Patient encouraged to leave her hand on the pouch for several more minutes after White leaves the room and is reminded to call the nursing staff to empty pouch when effluent or flatus fills the pouch 1/3 to 1/2 way. Additional supplies in cupboard over the sink: 3 pouches, 6 rings, 1 bottle of pectin powder.  Nursing staff is reminded to change pouching system if/wghen there is leakage and to refrain from taping the edges as effluent rich with digestive enzymes is still in contact with skin and this is likely to contribute to a parastomal skin complication. Education provided: Patient reassured that these wounds  near the stoma occur and that they gradually fill in and the pouch seal will last longer and longer as it does.   Enrolled patient in [place] Discharge program: Yes, previous admission.  [facility] nursing team will follow at intervals, and will remain available to this patient, the nursing, surgical and medical teams.  Please re-consult if needed in between visits. Thanks, Maudie Flakes, MSN, RN, [place], Arther Abbott  Pager# [phone]

## 2015-06-23 NOTE — Progress Notes (Signed)
[facility]   [facility] is providing the following services: Rollator walker  If patient discharges after hours, please call [phone].   Linward Headland 06/23/2015, 10:38 AM

## 2015-06-24 DIAGNOSIS — E860 Dehydration: Secondary | ICD-10-CM | POA: Diagnosis present

## 2015-06-24 LAB — BASIC METABOLIC PANEL
Anion gap: 7 (ref 5–15)
BUN: 13 mg/dL (ref 6–20)
CO2: 24 mmol/L (ref 22–32)
Calcium: 7.8 mg/dL — ABNORMAL LOW (ref 8.9–10.3)
Chloride: 104 mmol/L (ref 101–111)
Creatinine, Ser: 1.26 mg/dL — ABNORMAL HIGH (ref 0.44–1.00)
GFR calc Af Amer: 50 mL/min — ABNORMAL LOW (ref >60)
GFR calc non Af Amer: 43 mL/min — ABNORMAL LOW (ref >60)
Glucose, Bld: 107 mg/dL — ABNORMAL HIGH (ref 65–99)
Potassium: 3 mmol/L — ABNORMAL LOW (ref 3.5–5.1)
Sodium: 135 mmol/L (ref 135–145)

## 2015-06-24 LAB — CBC
HCT: 24.1 % — ABNORMAL LOW (ref 36.0–46.0)
Hemoglobin: 7.8 g/dL — ABNORMAL LOW (ref 12.0–15.0)
MCH: 27.5 pg (ref 26.0–34.0)
MCHC: 32.4 g/dL (ref 30.0–36.0)
MCV: 84.9 fL (ref 78.0–100.0)
Platelets: 324 10*3/uL (ref 150–400)
RBC: 2.84 MIL/uL — ABNORMAL LOW (ref 3.87–5.11)
RDW: 15.1 % (ref 11.5–15.5)
WBC: 7.9 10*3/uL (ref 4.0–10.5)

## 2015-06-24 MED ORDER — LOPERAMIDE HCL 2 MG PO CAPS: 2.0000 mg | ORAL_CAPSULE | Freq: Three times a day (TID) | ORAL | Status: DC | PRN

## 2015-06-24 MED ORDER — LOPERAMIDE HCL 2 MG PO CAPS
2.0000 mg | ORAL_CAPSULE | Freq: Every day | ORAL | Status: DC
  Administered 2015-06-24 – 2015-06-25 (×2): 2 mg via ORAL
  Filled 2015-06-24 (×2): qty 1

## 2015-06-24 MED ORDER — APIXABAN 5 MG PO TABS
5.0000 mg | ORAL_TABLET | Freq: Two times a day (BID) | ORAL | Status: DC
  Administered 2015-06-24 – 2015-07-06 (×22): 5 mg via ORAL
  Filled 2015-06-24 (×25): qty 1

## 2015-06-24 MED ORDER — POTASSIUM CHLORIDE CRYS ER 20 MEQ PO TBCR
40.0000 meq | EXTENDED_RELEASE_TABLET | Freq: Once | ORAL | Status: AC
  Administered 2015-06-24: 40 meq via ORAL
  Filled 2015-06-24: qty 2

## 2015-06-24 MED ORDER — HEPARIN SOD (PORK) LOCK FLUSH 100 UNIT/ML IV SOLN
250.0000 [IU] | INTRAVENOUS | Status: AC | PRN
  Administered 2015-06-24: 250 [IU]

## 2015-06-24 MED ORDER — SODIUM CHLORIDE 0.9 % IV SOLN: 2000.0000 mL | INTRAVENOUS | Status: DC

## 2015-06-24 MED ORDER — SODIUM CHLORIDE 0.9 % IV BOLUS (SEPSIS)
1000.0000 mL | Freq: Once | INTRAVENOUS | Status: AC
  Administered 2015-06-24: 1000 mL via INTRAVENOUS

## 2015-06-24 MED ORDER — ONDANSETRON HCL 4 MG PO TABS: 4.0000 mg | ORAL_TABLET | Freq: Four times a day (QID) | ORAL | Status: DC | PRN

## 2015-06-24 MED ORDER — BISMUTH SUBSALICYLATE 262 MG/15ML PO SUSP
30.0000 mL | Freq: Three times a day (TID) | ORAL | Status: DC | PRN
  Filled 2015-06-24: qty 118

## 2015-06-24 MED ORDER — TRAMADOL HCL 50 MG PO TABS: 50.0000 mg | ORAL_TABLET | Freq: Four times a day (QID) | ORAL | Status: DC | PRN

## 2015-06-24 MED ORDER — LOPERAMIDE HCL 2 MG PO CAPS
4.0000 mg | ORAL_CAPSULE | Freq: Every day | ORAL | Status: DC
  Administered 2015-06-24: 4 mg via ORAL
  Filled 2015-06-24: qty 2

## 2015-06-24 NOTE — Progress Notes (Signed)
Orthostatic VS not obtained due to pending d/c home

## 2015-06-24 NOTE — Progress Notes (Signed)
Patient ID: Brianna Walker, female   DOB: 06-20-1945, 70 y.o.   MRN: [id number]   [facility] PROGRESS NOTE  Brianna Walker [id number] DOB: 19-Sep-1945 DOA: 06/21/2015 PCP: Cathlean Cower, MD   Brief narrative:    70 year old female status post laparoscopic and robotic resection of GIST TUMOR lysis of adhesions on 2/3, discharged on 2/17, presented to the ER for evaluation of nausea vomiting, high ileostomy output, poor appetite and subjective fevers. General surgery consulted due to recent surgery. CT abdomen pelvis shows postsurgical changes, postsurgical ileus vs partial small bowel obstruction, small hiatal hernia. Chest x-ray negative. Creatinine has increased from 0.98> 5.91. Patient being admitted to the hospitalist service for further evaluation of her dehydration and renal failure.  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  Family Communication:  plan of care discussed with the patient Disposition Plan: Discharge by 06/26/2015  IV access:  Peripheral  IV  Procedures and diagnostic studies:    Ct Abdomen Pelvis Wo Contrast 06/21/2015 Mildly improved postsurgical jejunal ileus or partial obstruction, with transition in the left mid abdomen. This could be an indication of an adhesion.   Dg Chest Portable 1 View 06/21/2015 Stable.  No acute findings.   Dg Chest Port 1 View 06/21/2015  No acute cardiopulmonary disease.  Medical Consultants:  Surgery  Other Consultants:  None   IAnti-Infectives:   None   Faye Ramsay, MD  [facility] Pager [phone]  If 7PM-7AM, please contact night-coverage [email or web address] Password [email or web address] 06/24/2015, 6:30 PM   LOS: 3 days   HPI/Subjective: No events overnight. Reports feeling much better this AM  Objective: Filed Vitals:   06/23/15 0619 06/23/15 1422 06/23/15 2004 06/24/15 0433  BP: 146/68 143/72 155/74 156/77  Pulse: 101 92 101 105  Temp: 98.4 F (36.9 C) 98.5 F (36.9 C) 98.4 F (36.9 C) 98.3 F (36.8 C)  TempSrc: Oral Oral Oral Oral  Resp: 18 17 17 18   Height:      Weight:      SpO2: 99%  98% 100%    Intake/Output Summary (Last 24 hours) at 06/24/15 1830 Last data filed at 06/24/15 1427  Gross per 24 hour  Intake   2440 ml  Output   2325 ml  Net    115 ml    Exam:   General:  Pt is alert, follows commands appropriately, not in acute distress  Cardiovascular: Regular rhythm, tachycardic, no rubs, no gallops  Respiratory: Clear to auscultation bilaterally, no wheezing, no crackles, no rhonchi  Abdomen: Soft, non tender, non distended,  bowel sounds present, no guarding  Data Reviewed: Basic Metabolic Panel:  Recent Labs Lab 06/18/15 0338 06/21/15 0741 06/21/15 1239 06/22/15 0406 06/23/15 0720 06/24/15 0900  NA 140 134*  --  136 141 135  K 3.6 3.5  --  3.6 3.4* 3.0*  CL 102 95*  --  104 107 104  CO2 27 23  --  22 25 24   GLUCOSE 82 120*  --  96 100* 107*  BUN 10 35*  --  33* 21* 13  CREATININE 0.98 5.91*  --  4.29* 1.82* 1.26*  CALCIUM 8.8* 9.0  --  7.9* 8.2*  7.8*  MG 1.8  --  1.7  --   --   --    Liver Function Tests:  Recent Labs Lab 06/21/15 0741 06/22/15 0406  AST 16 13*  ALT 22 14  ALKPHOS 119 87  BILITOT 1.2 0.8  PROT 7.8 6.0*  ALBUMIN 3.6 2.5*    Recent Labs Lab 06/21/15 0741  LIPASE 85*    CBC:  Recent Labs Lab 06/21/15 0741 06/21/15 1239 06/22/15 0406 06/23/15 0720 06/24/15 0900  WBC 12.7* 11.1* 8.2 5.6 7.9  NEUTROABS 11.1*  --   --   --   --   HGB 9.7* 8.8* 7.8* 7.8* 7.8*  HCT 30.3* 26.0* 23.3* 23.7* 24.1*  MCV 85.4 83.9 85.0 84.9 84.9  PLT 522* 370 352 309 324   Recent Results (from the past 240 hour(s))  Urine culture     Status: None   Collection Time: 06/21/15  1:16 PM  Result Value Ref Range Status   Specimen Description URINE, CATHETERIZED  Final   Special Requests NONE  Final   Culture   Final    NO GROWTH 1 DAY Performed at [facility]    Report Status 06/22/2015 FINAL  Final  C difficile quick scan w PCR reflex     Status: None   Collection Time: 06/21/15  7:21 PM  Result Value Ref Range Status   C Diff antigen NEGATIVE NEGATIVE Final   C Diff toxin NEGATIVE NEGATIVE Final   C Diff interpretation Negative for toxigenic C. difficile  Final  Gastrointestinal Panel by PCR , Stool     Status: None   Collection Time: 06/21/15  7:21 PM  Result Value Ref Range Status   Campylobacter species NOT DETECTED NOT DETECTED Final   Plesimonas shigelloides NOT DETECTED NOT DETECTED Final   Salmonella species NOT DETECTED NOT DETECTED Final   Yersinia enterocolitica NOT DETECTED NOT DETECTED Final   Vibrio species NOT DETECTED NOT DETECTED Final   Vibrio cholerae NOT DETECTED NOT DETECTED Final   Enteroaggregative E coli (EAEC) NOT DETECTED NOT DETECTED Final   Enteropathogenic E coli (EPEC) NOT DETECTED NOT DETECTED Final   Enterotoxigenic E coli (ETEC) NOT DETECTED NOT DETECTED Final   Shiga like toxin producing E coli (STEC) NOT DETECTED NOT DETECTED Final   E. coli O157 NOT DETECTED NOT  DETECTED Final   Shigella/Enteroinvasive E coli (EIEC) NOT DETECTED NOT DETECTED Final   Cryptosporidium NOT DETECTED NOT DETECTED Final   Cyclospora cayetanensis NOT DETECTED NOT DETECTED Final   Entamoeba histolytica NOT DETECTED NOT DETECTED Final   Giardia lamblia NOT DETECTED NOT DETECTED Final   Adenovirus F40/41 NOT DETECTED NOT DETECTED Final   Astrovirus NOT DETECTED NOT DETECTED Final   Norovirus GI/GII NOT DETECTED NOT DETECTED Final   Rotavirus A NOT DETECTED NOT DETECTED Final   Sapovirus (I, II, IV, and V) NOT DETECTED  NOT DETECTED Final     Scheduled Meds: . aspirin  81 mg Oral QHS  . enoxaparin (LOVENOX) injection  1 mg/kg Subcutaneous Q24H  . ferrous sulfate  325 mg Oral BID WC  . lip balm  1 application Topical BID  . loperamide  2 mg Oral Q breakfast  . loperamide  4 mg Oral QHS  . sodium chloride flush  10-40 mL Intracatheter Q12H  . sodium chloride flush  3 mL Intravenous Q12H   Continuous Infusions:

## 2015-06-24 NOTE — Progress Notes (Signed)
Patient discharged to home with PICC line, flushed per IV team, discharge, medication and follow up instructions given with prescriptions, verbalized understanding, ostomy supplies given, telemetry box removed, family to transport home

## 2015-06-24 NOTE — Progress Notes (Signed)
Nutrition Follow-up  DOCUMENTATION CODES:   Severe malnutrition in context of acute illness/injury, Obesity unspecified  INTERVENTION:  - Continue Regular diet - Pt may benefit from trial of appetite stimulant - Should TF be warranted/desired, recommend Jevity 1.5 @ 60 mL/hr with 30 mL Prostat BID which provides 2260 kcal, 122 grams of protein, 1095 mL free water - RD will continue to monitor for needs  NUTRITION DIAGNOSIS:   Inadequate oral intake related to poor appetite as evidenced by per patient/family report, meal completion < 25%.  GOAL:   Patient will meet greater than or equal to 90% of their needs  MONITOR:   PO intake  REASON FOR ASSESSMENT:   Consult Calorie Count  ASSESSMENT:   70 year old female status post laparoscopic and robotic resection of GIST TUMOR lysis of adhesions on 2/3, discharged on 2/17, presents to the ER today with acute renal failure, dehydration, nausea vomiting, high ileostomy output, poor appetite and subjective fevers. General surgery consulted due to recent surgery. CT abdomen pelvis shows postsurgical changes, postsurgical ileus vs partial small bowel obstruction, small hiatal hernia.  Pt seen for day 2 of calorie count. Pt received toast and hot tea for breakfast this AM. She consumed ~50% of tea and requested a new cup of hot tea due to luke warm temperature. Pt had called [facility] for a new piece of toast as the one she received was not to her liking.  RN retained meal tickets from lunch and dinner yesterday (2/22). Per these pt ate 5% of chicken noodle soup and 25 mL of fluid for dinner and 30% of cup of tea and 2 bites of mashed potatoes for dinner.  Pt is not meeting needs. She refuses all nutrition supplements that are available. Asked pt if RD could order something more for her for breakfast and she declined. Pt may benefit from trial of appetite stimulant.  She had one episode of emesis following consumption of potassium pill and denies  any other episodes of nausea or vomiting.  Medications reviewed. Labs reviewed; K: 3.4 mmol/L, BUN/creatinine elevated but trending down, Ca: 8.2 mg/dL, GFR: 32.   Diet Order:  Diet regular Room service appropriate?: Yes; Fluid consistency:: Thin  Skin:  Reviewed, no issues  Last BM:  2/23  Height:   Ht Readings from Last 1 Encounters:  06/22/15 5\' 3"  (1.6 m)    Weight:   Wt Readings from Last 1 Encounters:  06/22/15 202 lb 13.2 oz (92 kg)    Ideal Body Weight:  52.27 kg (kg)  BMI:  Body mass index is 35.94 kg/(m^2).  Estimated Nutritional Needs:   Kcal:  2200-2400  Protein:  120-130 grams  Fluid:  >/= 2.8 L/day  EDUCATION NEEDS:   No education needs identified at this time     Jarome Matin, RD, LDN Inpatient Clinical Dietitian Pager # [phone] After hours/weekend pager # [phone]

## 2015-06-24 NOTE — Progress Notes (Signed)
NT attempted x 3 to obtain orthostatic VS but was met with refusal from pt due to not feeling good or having visitors

## 2015-06-24 NOTE — Consult Note (Signed)
WOC ostomy follow up Stoma type/location: RLQ loop ileostomy Stomal assessment/size: 1 and 1/8 inches, red, moist, budded.  Mucocutaneous separation noted on pouch change yesterday. Peristomal assessment: See above for parastomal defect Treatment options for stomal/peristomal skin: See below. (Fill defect to skin level with pectin powder, top with pectin strips and an additional ring around stoma, then pouch with convex pouching system.) Output: thin, dark brown effluent with strong odor.  Ostomy pouching: 1pc.convex pouch Kellie Simmering [phone]), skin barrier rings-2 Kellie Simmering [phone]) and [employer] pectin powder Kellie Simmering # 6). Pouching regimen: Parastomal defect filled to skin level with pectin based powder (Adapt). One skin barrier ring cut in half and applied as two "strips" over the powder-filled defect. One skin barrier ring stretched to fit around stoma and applied to skin. 1-piece convex pouch cut-to-fit size and shape of loop ileotomy and applied to skin. Hand held over pouching system, particularly the area of the parastomal wound and ostomy, for gentle warming hand pressure to enhance pouch seal for several minutes.  Pouch is in place today.  More supplies are ordered in anticipation of discharge today with HH.  Will go home with 5 pouching systems.  [facility] is notified of pouching procedure.  Patient is minimally participative in her care.  Education provided: Patient indicated that she is discharging today.  Informed her that we would order additional supplies for her. Her pouch is intact at this time.  MD has been to assess and pouch was just emptied.  Enrolled patient in [place] Start Discharge program: Yes Will not follow at this time.  Discharging home.  Domenic Moras RN BSN [place] Pager [phone]

## 2015-06-24 NOTE — Progress Notes (Addendum)
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 Resection of cystic mass near small bowel with serosal repair.  Surgeon(s): Michael Boston, MD Leighton Ruff, MD - Assist   Diagnosis 1. Soft tissue, biopsy, abdominal wall cyst ? fistula BENIGN CYST AND REACTIVE CHANGES NEGATIVE FOR MALIGNANCY 2. Soft tissue mass, simple excision, Pelvic GIST GASTROINTESTINAL STROMAL TUMORS OF THE RECTUM SHOWING TREATMENT EFFECT (9.5 CM) MARGIN OF RESECTION IS POSITIVE 3. Colon, segmental resection for tumor, rectum DIVERTICULA NO RESIDUAL GASTROINTESTINAL STROMAL TUMOR IDENTIFIED MARGINS OF RESECTION ARE NEGATIVE FIFTEEN BENIGN LYMPH NODES (0/15) Microscopic Comment 2. GASTROINTESTINAL STROMAL TUMOR (GIST): Procedure: segmental resection Tumor Site: rectum Specify: pelvis rectum Tumor Size: 9.5 cm Tumor Focality Unifocal: focal GIST Subtype (spindle, epithelioid, mixed, etc.): spindle Mitotic Rate: 1 /50 HIGH POWER FIELD Necrosis: negative Histologic Grade 1 G1: Low grade; mitotic rate 5/50 HIGH POWER FIELD. G2: High grade; mitotic rate >5/50 HIGH POWER FIELD. Risk Assessment: _ Margins: positive Ancillary testing: Per request 1 of 3 FINAL for NIDYA, BOUYER  [phone]) Microscopic Comment(continued) Lymph nodes: number examined: 15; number positive: 0 Pathologic Staging: pT3 pN0 Comments: The tumor shows treatment effect with hypocellular, atrophic spindle cells and hyalinized, fibrotic stroma. less than 1% of the tumor without treatment effect. [name] MD Pathologist, Electronic Signature (Case signed 06/10/2015) Specimen Bettylee Feig and Clinical Information Specimen(s) Obtained: 1. Soft tissue, biopsy, abdominal wall cyst ? fistula 2. Soft tissue mass, simple excision, Pelvic GIST 3. Colon, segmental resection for tumor, rectum Specimen Clinical Information 1. GIST tumor of rectum in pelvis (kp) Charisse Wendell 1. In formalin is a a 3.1 x 1.4 x 0.3 cm irregular portion of fatty tissue, which on sectioning contains a 0.5 cm collapsed smooth cystic space. Sectioned and entirely submitted in one block. 2. Received in formalin is a 176 gram, 9.5 x 6.8 x 5.8 cm rubbery to firm mass, which has an attached suture over a 2.1 x 1.5 cm roughened area, clinically identifying anterior distal. This area is inked orange. There is also a 5.0 x 4.2 cm disrupted area, which is clinically identified as disrupted margin. This area is inked black. Also along one aspect is a 3.4 x 3.3 cm area of tan to hyperemic possible smooth mucosa. The cut surfaces of the mass are tan to tan yellow, with vaguely whorled cut surfaces, and scattered myxoid areas. Representative sections are submitted as follows: A, B = sections from clinically disrupted margin C = sections from area identified by suture as anterior distal D, E = central sections Total: 5 blocks submitted 3. Received in formalin is a 27 cm in length segment of colon, clinically recto-sigmoid, which includes at least probable middle third of rectum. Surrounding mesorectum is incomplete and focally disrupted. There is an attached suture over the anterior perirectal surface. The mucosa is tan pink, smooth, soft with  normal intestinal folds. There are  no mucosal lesions identified. Within the sigmoid portion are several unperforated diverticula. Found within the surrounding fat are 15 possible lymph nodes ranging from 0.1 to 0.6 cm. Block Summary: A = proximal margin B, C = distal margin D, E = sections taken at attached suture over anterior mesorectum F = diverticulum G = four nodes, whole H = four nodes, whole I = four nodes, whole J = three nodes, whole Total: 10 blocks submitted (SW:ds 06/07/15) 2 of 3 FINAL for CHISTINA, ROSTON ([id number]) Report signed out from the following location(s) Technical component and interpretation was performed at [facility], [place], [place] [address]. CLIA #: [id number], 3 of 3 Assessment  RECURRENT DEHYDRATION - better   Plan:  She can try to leave later today if [facility] arranged & she is eating better.  She again declines SNF & claims to have good home support.  I am guarded but OK to see if she meets goals.  -ARF/dehydration - nearly resolved.  Bolus NS & medlock  -D/C Foley.  -Increase PO - pt feels much better (but she has said that before) -  cal counts mediocre  -no stricture at ostomy - no evid by CT & higher output argues against this. OSTOMY CARE / TRAINING  -antidiarrheal regimen as tolerated to avoid dehydration.  Inc iron.  Imodium BID & then PRN  -VTE prophylaxis- SCDs, etc  -GERD - PPI  -hyperlipidemia - Tx  -HTN  - no ACE Inh  -h/o PE - Eliquis as tolerated.  Monitor with ARF  -mobilize as tolerated to help recovery  -Pathology - ?+margin most likely fracture of GIST exposed.  Most likely will get gleevec post-adjuvant for 3 years total per Dr Benay Spice.   D/C patient from hospital when patient meets criteria (anticipate in 0-2 day(s)):  Tolerating oral intake well Controlling diarrhea Ambulating well Adequate pain control without IV medications Urinating  Having flatus Disposition planning in  place - Increase HH IVF via PICC qMWF to 2L (instead of 1L qMWF) Ostomy care/teaching   Adin [name], M.D., F.A.C.S. Gastrointestinal and Minimally Invasive Surgery [facility] [address], [place], [place] [address] [phone] Main / Paging   06/24/2015  Subjective:  "I feel better.  I want to go home today" Walking more No anorectal pain No pain at ileostomy  Emptying ileostomy bag, esp gas Claims to be drinking a lot of water.   Appetite OK - cal counts fair - but pt says she feels better today No nausea.  No emesis in 48hrs    Objective:  Vital signs:  Filed Vitals:   06/23/15 0619 06/23/15 1422 06/23/15 2004 06/24/15 0433  BP: 146/68 143/72 155/74 156/77  Pulse: 101 92 101 105  Temp: 98.4 F (36.9 C) 98.5 F (36.9 C) 98.4 F (36.9 C) 98.3 F (36.8 C)  TempSrc: Oral Oral Oral Oral  Resp: _0 Height:      Weight:      SpO2: 99%  98% 100%    Last BM Date: 06/24/15  Intake/Output   Yesterday:  02/22 0701 - 02/23 0700 In: 2118.3 [P.O.:267; I.V.:1851.3] Out: 1475 [Urine:975; Stool:500] This shift:     Bowel function:  Flatus: YES  BM: thin effluent in bag.   Drain: n/a  Physical Exam:  General: Pt awake/alert/oriented x4 in no acute distress.  Smiling, alert Eyes: PERRL, normal EOM.  Sclera clear.  No icterus Neuro: CN II-XII intact w/o  focal sensory/motor deficits. Lymph: No head/neck/groin lymphadenopathy Psych:  No delerium/psychosis/paranoia HENT: Normocephalic, Mucus membranes moist.  No thrush Neck: Supple, No tracheal deviation Chest: No chest wall pain w good excursion CV:  Pulses intact.  Regular rhythm MS: Normal AROM mjr joints.  No obvious deformity Abdomen: Soft but morbidly obese.  Nondistended.  Nontender.  No fluctuance/cellultis.  No guarding.  No evidence of peritonitis.  No incarcerated hernias.  Loop ileostomy pink with moderate thin effluent out superior orifice.  itubates  easily w mild kink with a 24Fr Robinson catheter.  No necrosis.  Moderate skin separation Rectal.  Decreased sphincter tone.  Stitches intact Ext:  SCDs BLE.  No mjr edema.  No cyanosis Skin: No petechiae / purpura  Results:   Labs: Results for orders placed or performed during the hospital encounter of 06/21/15 (from the past 48 hour(s))  CBC     Status: Abnormal   Collection Time: 06/23/15  7:20 AM  Result Value Ref Range   WBC 5.6 4.0 - 10.5 K/uL   RBC 2.79 (L) 3.87 - 5.11 MIL/uL   Hemoglobin 7.8 (L) 12.0 - 15.0 g/dL   HCT 23.7 (L) 36.0 - 46.0 %   MCV 84.9 78.0 - 100.0 fL   MCH 28.0 26.0 - 34.0 pg   MCHC 32.9 30.0 - 36.0 g/dL   RDW 15.1 11.5 - 15.5 %   Platelets 309 150 - 400 K/uL  Basic metabolic panel     Status: Abnormal   Collection Time: 06/23/15  7:20 AM  Result Value Ref Range   Sodium 141 135 - 145 mmol/L   Potassium 3.4 (L) 3.5 - 5.1 mmol/L   Chloride 107 101 - 111 mmol/L   CO2 25 22 - 32 mmol/L   Glucose, Bld 100 (H) 65 - 99 mg/dL   BUN 21 (H) 6 - 20 mg/dL    Comment: REPEATED TO VERIFY   Creatinine, Ser 1.82 (H) 0.44 - 1.00 mg/dL    Comment: DELTA CHECK NOTED REPEATED TO VERIFY    Calcium 8.2 (L) 8.9 - 10.3 mg/dL   GFR calc non Af Amer 28 (L) >60 mL/min   GFR calc Af Amer 32 (L) >60 mL/min    Comment: (NOTE) The eGFR has been calculated using the CKD EPI equation. This calculation has not been validated in all clinical situations. eGFR's persistently <60 mL/min signify possible Chronic Kidney Disease.    Anion gap 9 5 - 15      Imaging / Studies: No results found.  Medications / Allergies: per chart  Antibiotics: Anti-infectives    None        Note: Portions of this report may have been transcribed using voice recognition software. Every effort was made to ensure accuracy; however, inadvertent computerized transcription errors may be present.   Any transcriptional errors that result from this process are unintentional.     Adin [name], M.D., F.A.C.S. Gastrointestinal and Minimally Invasive Surgery [facility] [address], [place], [place] [address] [phone] Main / Paging   06/24/2015  CARE TEAM:  PCP: Cathlean Cower, MD  Outpatient Care Team: Patient Care Team: Biagio Borg, MD as PCP - General Michael Boston, MD as Consulting Physician (General Surgery) Ladell Pier, MD as Consulting Physician (Oncology) Milus Banister, MD as Consulting Physician (Gastroenterology) Billy Fischer, MD as Consulting Physician (Family Medicine)  Inpatient Treatment Team: Treatment Team: Attending Provider: Theodis Blaze, MD; Consulting Physician: Michael Boston, MD;  Rounding Team: Ian Bushman, MD; Technician: Geoffery Spruce, NT; Technician: Guadalupe Maple, [place]; Registered Nurse: Saunders Glance, RN; Technician: Desma Mcgregor

## 2015-06-25 ENCOUNTER — Other Ambulatory Visit: Payer: Commercial Managed Care - HMO

## 2015-06-25 ENCOUNTER — Ambulatory Visit: Payer: Commercial Managed Care - HMO | Admitting: Nurse Practitioner

## 2015-06-25 LAB — BASIC METABOLIC PANEL
ANION GAP: 11 (ref 5–15)
Anion gap: 11 (ref 5–15)
BUN: 13 mg/dL (ref 6–20)
BUN: 12 mg/dL (ref 6–20)
CALCIUM: 8 mg/dL — AB (ref 8.9–10.3)
CO2: 24 mmol/L (ref 22–32)
CO2: 23 mmol/L (ref 22–32)
CREATININE: 1.19 mg/dL — AB (ref 0.44–1.00)
Calcium: 8.1 mg/dL — ABNORMAL LOW (ref 8.9–10.3)
Chloride: 101 mmol/L (ref 101–111)
Chloride: 102 mmol/L (ref 101–111)
Creatinine, Ser: 1.08 mg/dL — ABNORMAL HIGH (ref 0.44–1.00)
GFR calc Af Amer: 54 mL/min — ABNORMAL LOW (ref >60)
GFR, EST NON AFRICAN AMERICAN: 46 mL/min — AB (ref >60)
GFR, EST NON AFRICAN AMERICAN: 52 mL/min — AB (ref >60)
GLUCOSE: 110 mg/dL — AB (ref 65–99)
Glucose, Bld: 120 mg/dL — ABNORMAL HIGH (ref 65–99)
POTASSIUM: 3.1 mmol/L — AB (ref 3.5–5.1)
POTASSIUM: 3 mmol/L — AB (ref 3.5–5.1)
SODIUM: 136 mmol/L (ref 135–145)
Sodium: 136 mmol/L (ref 135–145)

## 2015-06-25 LAB — CBC
HCT: 25.3 % — ABNORMAL LOW (ref 36.0–46.0)
Hemoglobin: 8.3 g/dL — ABNORMAL LOW (ref 12.0–15.0)
MCH: 27.5 pg (ref 26.0–34.0)
MCHC: 32.8 g/dL (ref 30.0–36.0)
MCV: 83.8 fL (ref 78.0–100.0)
PLATELETS: 354 10*3/uL (ref 150–400)
RBC: 3.02 MIL/uL — AB (ref 3.87–5.11)
RDW: 15.1 % (ref 11.5–15.5)
WBC: 9 10*3/uL (ref 4.0–10.5)

## 2015-06-25 LAB — GLUCOSE, CAPILLARY: Glucose-Capillary: 140 mg/dL — ABNORMAL HIGH (ref 65–99)

## 2015-06-25 LAB — MAGNESIUM: Magnesium: 1.2 mg/dL — ABNORMAL LOW (ref 1.7–2.4)

## 2015-06-25 MED ORDER — INSULIN ASPART 100 UNIT/ML ~~LOC~~ SOLN
0.0000 [IU] | Freq: Four times a day (QID) | SUBCUTANEOUS | Status: DC
  Administered 2015-06-26: 1 [IU] via SUBCUTANEOUS
  Administered 2015-06-27: 3 [IU] via SUBCUTANEOUS
  Administered 2015-06-28: 2 [IU] via SUBCUTANEOUS
  Administered 2015-06-29: 1 [IU] via SUBCUTANEOUS
  Administered 2015-06-29 – 2015-07-01 (×4): 2 [IU] via SUBCUTANEOUS
  Administered 2015-07-02: 3 [IU] via SUBCUTANEOUS
  Administered 2015-07-02 – 2015-07-03 (×4): 2 [IU] via SUBCUTANEOUS
  Administered 2015-07-03: 3 [IU] via SUBCUTANEOUS
  Administered 2015-07-04: 2 [IU] via SUBCUTANEOUS

## 2015-06-25 MED ORDER — SODIUM CHLORIDE 0.9 % IV BOLUS (SEPSIS): 2000.0000 mL | Freq: Once | INTRAVENOUS | Status: DC

## 2015-06-25 MED ORDER — MEGESTROL ACETATE 40 MG PO TABS: 40.0000 mg | ORAL_TABLET | Freq: Every day | ORAL | Status: DC

## 2015-06-25 MED ORDER — POTASSIUM CHLORIDE CRYS ER 20 MEQ PO TBCR
40.0000 meq | EXTENDED_RELEASE_TABLET | Freq: Every day | ORAL | Status: DC
  Administered 2015-06-25 – 2015-06-27 (×3): 40 meq via ORAL
  Filled 2015-06-25 (×3): qty 2

## 2015-06-25 MED ORDER — TRACE MINERALS CR-CU-MN-SE-ZN 10-1000-500-60 MCG/ML IV SOLN
INTRAVENOUS | Status: AC
  Administered 2015-06-25: 20:00:00 via INTRAVENOUS
  Filled 2015-06-25 (×2): qty 880

## 2015-06-25 MED ORDER — ESTRADIOL 0.05 MG/24HR TD PTWK
0.0500 mg | MEDICATED_PATCH | TRANSDERMAL | Status: DC
  Administered 2015-06-25: 0.05 mg via TRANSDERMAL
  Filled 2015-06-25: qty 1

## 2015-06-25 MED ORDER — LOPERAMIDE HCL 2 MG PO CAPS
2.0000 mg | ORAL_CAPSULE | Freq: Every day | ORAL | Status: DC
  Administered 2015-06-25 – 2015-06-27 (×3): 2 mg via ORAL
  Filled 2015-06-25 (×4): qty 1

## 2015-06-25 MED ORDER — MAGNESIUM SULFATE 4 GM/100ML IV SOLN
4.0000 g | Freq: Once | INTRAVENOUS | Status: AC
  Administered 2015-06-25: 4 g via INTRAVENOUS
  Filled 2015-06-25: qty 100

## 2015-06-25 MED ORDER — PANTOPRAZOLE SODIUM 40 MG PO TBEC
80.0000 mg | DELAYED_RELEASE_TABLET | Freq: Every day | ORAL | Status: DC
  Administered 2015-06-25 – 2015-06-28 (×3): 80 mg via ORAL
  Filled 2015-06-25 (×4): qty 2

## 2015-06-25 MED ORDER — POTASSIUM CHLORIDE CRYS ER 20 MEQ PO TBCR
40.0000 meq | EXTENDED_RELEASE_TABLET | Freq: Once | ORAL | Status: DC
  Filled 2015-06-25: qty 2

## 2015-06-25 MED ORDER — MEGESTROL ACETATE 40 MG/ML PO SUSP
400.0000 mg | Freq: Every day | ORAL | Status: DC
  Administered 2015-06-25 – 2015-07-06 (×10): 400 mg via ORAL
  Filled 2015-06-25 (×13): qty 10

## 2015-06-25 MED ORDER — ONDANSETRON HCL 4 MG PO TABS
4.0000 mg | ORAL_TABLET | Freq: Three times a day (TID) | ORAL | Status: DC
  Administered 2015-06-25 – 2015-06-29 (×15): 4 mg via ORAL
  Filled 2015-06-25 (×15): qty 1

## 2015-06-25 MED ORDER — FAT EMULSION 20 % IV EMUL
220.0000 mL | INTRAVENOUS | Status: AC
  Administered 2015-06-25: 220 mL via INTRAVENOUS
  Filled 2015-06-25: qty 250

## 2015-06-25 MED ORDER — PREMIER PROTEIN SHAKE
2.0000 [oz_av] | Freq: Four times a day (QID) | ORAL | Status: DC
  Administered 2015-06-25 – 2015-06-26 (×2): 2 [oz_av] via ORAL

## 2015-06-25 NOTE — Progress Notes (Signed)
Received patient from [facility], via wheel chair,  A&O x 4, VS taken, placed in bed, ileostomy intact. Patient stated that she still has mucous from rectum.  Started Bolus first  of two bags of NS.

## 2015-06-25 NOTE — Progress Notes (Signed)
PARENTERAL NUTRITION CONSULT NOTE - INITIAL  Pharmacy Consult for TPN Indication: intolerance to enteral feeds  No Known Allergies  Patient Measurements: Height: 5\' 3"  (160 cm) Weight: 202 lb 13.2 oz (92 kg) IBW/kg (Calculated) : 52.4 Adjusted Body Weight: 150# (68kg) Usual Weight: ~225# (lost ~24# in past month)  Vital Signs: Temp: 98.6 F (37 C) (02/24 1417) Temp Source: Oral (02/24 1417) BP: 148/71 mmHg (02/24 1417) Pulse Rate: 111 (02/24 1417) Intake/Output from previous day: 02/23 0701 - 02/24 0700 In: 598.8 [I.V.:598.8] Out: 1050 [Urine:800; Stool:250] Intake/Output from this shift: Total I/O In: 360 [P.O.:360] Out: 3 [Urine:2; Stool:1]  Labs:  Recent Labs  06/23/15 0720 06/24/15 0900 06/25/15 0648  WBC 5.6 7.9 9.0  HGB 7.8* 7.8* 8.3*  HCT 23.7* 24.1* 25.3*  PLT 309 324 354     Recent Labs  06/24/15 0900 06/25/15 0648 06/25/15 1350  NA 135 136 136  K 3.0* 3.1* 3.0*  CL 104 101 102  CO2 24 24 23   GLUCOSE 107* 110* 120*  BUN 13 13 12   CREATININE 1.26* 1.19* 1.08*  CALCIUM 7.8* 8.1* 8.0*  MG  --  1.2*  --    Estimated Creatinine Clearance: 54.4 mL/min (by C-G formula based on Cr of 1.08).   No results for input(s): GLUCAP in the last 72 hours.  Medical History: Past Medical History  Diagnosis Date  . ALLERGIC RHINITIS 09/12/2007  . BACK PAIN 06/19/2008  . Cramp of limb 08/11/2009  . Long term (current) use of anticoagulants 11/29/2010  . FREQUENCY, URINARY 10/07/2009  . GERD 12/05/2006  . HEMORRHOIDS 11/17/2006  . HIP PAIN, RIGHT, CHRONIC 08/11/2009  . HX, PERSONAL, TUBERCULOSIS 11/17/2006  . HYPERLIPIDEMIA 09/12/2007  . HYPERTENSION 11/17/2006  . INSOMNIA-SLEEP DISORDER-UNSPEC 06/19/2008  . LEG PAIN, RIGHT 06/19/2008  . MASS, SUPERFICIAL 09/12/2007  . MENOPAUSAL DISORDER 09/20/2009  . OVERACTIVE BLADDER 03/04/2010  . PULMONARY EMBOLISM, HX OF 11/17/2006    [place] [facility] s/p Pneumonia  . RECTAL BLEEDING 10/07/2007  . SCIATICA, RIGHT  09/12/2007  . SKIN LESION 07/20/2009  . TMJ SYNDROME 11/17/2006  . Iron deficiency anemia 12/07/2011  . Diverticulosis   . Hyperlipidemia 09/12/2007    Qualifier: Diagnosis of  By: Jenny Reichmann MD, Hunt Oris   . Arthritis   . Colon polyps   . DEEP VENOUS THROMBOPHLEBITIS, LEG, RIGHT 03/04/2010    right leg  . Pneumonia   . Rectal mass 06/2014  . Cancer ([place])     dx. 4'16 -oral chemotherapy only.  . Shortness of breath dyspnea     With exertion since chemotherapy tx-oral meds  . Tuberculosis     pt. was tx.    Medications:  Scheduled:  . apixaban  5 mg Oral BID  . aspirin  81 mg Oral QHS  . estradiol  0.05 mg Transdermal Weekly  . ferrous sulfate  325 mg Oral BID WC  . lip balm  1 application Topical BID  . loperamide  2 mg Oral QHS  . megestrol  400 mg Oral Daily  . ondansetron  4 mg Oral TID AC & HS  . pantoprazole  80 mg Oral Daily  . potassium chloride  40 mEq Oral Daily  . protein supplement shake  2 oz Oral QID  . sodium chloride  2,000 mL Intravenous Once  . sodium chloride flush  10-40 mL Intracatheter Q12H  . sodium chloride flush  3 mL Intravenous Q12H   Infusions:    Insulin Requirements in the past 24 hours:  None (no Hx DM)  Current Nutrition:  Premier Protein 2oz QID  Assessment: [name] s/p laparoscopic and robotic resection of GIST TUMOR lysis of adhesions on 2/3.  She was discharged on 2/17, but returned 2/20 with nausea vomiting, high ileostomy output, poor appetite and subjective fevers. She continues with poor oral intake and nausea/vomiting.  Also positive for skin breakdown at ostomy site as a result of malnutrition.  No evidence of obstruction per surgery.  Pharmacy consulted to start parenteral nutrition.    2/7: PICC line placement  06/25/2015:   Potassium low (3.0).  Received Kdur 50mEq x1 today, but K+ unchanged.  All other electrolytes WNL.  Glucose at goal  Renal function has improved to patient's baseline  Nutritional Goals:  Kcal:  2200-2400 Protein: 120-130 grams Fluid: >/= 2.8 L/day  Clinimix E 5/15 at 105 ml/hr + 20% Lipids at 60ml/hr will meet 100% of patient's goals.   Plan:  Initiate Clinimix E 5/15 at 53ml/hr.  Titrate to goal rate as tolerated.   Standard multivitamins and trace elements in TPN. Initiate 20% lipids at 29ml/hr. Initiate CBG monitoring + moderate correction scale insulin q6h. Hypokalemia- give another Kdur 70mEq x1 tonight. TPN baseline labs in am and then per protocol on Mon/Thurs.  Biagio Borg 06/25/2015,5:40 PM

## 2015-06-25 NOTE — Progress Notes (Addendum)
Patient ID: Brianna Walker, female   DOB: June 20, 1945, 70 y.o.   MRN: [id number]   [facility] PROGRESS NOTE  Brianna Walker [id number] DOB: Nov 21, 1945 DOA: 06/21/2015 PCP: Cathlean Cower, MD   Brief narrative:    70 year old female status post laparoscopic and robotic resection of GIST TUMOR lysis of adhesions on 2/3, discharged on 2/17, presented to the ER for evaluation of nausea vomiting, high ileostomy output, poor appetite and subjective fevers. General surgery consulted due to recent surgery. CT abdomen pelvis shows postsurgical changes, postsurgical ileus vs partial small bowel obstruction, small hiatal hernia. Chest x-ray negative. Creatinine has increased from 0.98> 5.91. Patient being admitted to the hospitalist service for further evaluation of her dehydration and renal failure.  Assessment/Plan:    Principal Problem:   AKI (acute kidney injury) (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      Iron deficiency anemia - Monitor closely for signs of bleeding and transfuse if hemoglobin less than 7 - CBC in the morning    Protein-calorie malnutrition, severe ([facility]) - advance diet   DVT prophylaxis - resume home regimen with Epixiban  Code Status: Full.  Family Communication:  plan of care discussed with the patient Disposition Plan: Discharge by 06/28/2015  IV access:   Peripheral IV  Procedures and diagnostic studies:    Ct Abdomen Pelvis Wo Contrast 06/21/2015 Mildly improved postsurgical jejunal ileus or partial obstruction, with transition in the left mid abdomen. This could be an indication of an adhesion.   Dg Chest Portable 1 View 06/21/2015 Stable.  No acute findings.   Dg Chest Port 1 View 06/21/2015  No acute cardiopulmonary disease.  Medical Consultants:  Surgery  Other Consultants:  None   IAnti-Infectives:   None   Faye Ramsay, MD  [facility] Pager [phone]  If 7PM-7AM, please contact night-coverage [email or web address] Password [facility] 06/25/2015, 1:51 PM   LOS: 4 days   HPI/Subjective: No events overnight. Reports feeling much better this AM, no vomiting this AM  Objective: Filed Vitals:   06/24/15 1435 06/24/15 2137 06/25/15 0633 06/25/15 0944  BP: 154/76 154/74 146/75 137/76  Pulse: 98 104 104 124  Temp: 98.2 F (36.8 C) 98.8 F (37.1 C) 98.8 F (37.1 C) 99.1 F (37.3 C)  TempSrc: Oral Oral Oral Oral  Resp: 16 20 20 20   Height:      Weight:      SpO2: 99% 98% 100% 100%    Intake/Output Summary (Last 24 hours) at 06/25/15 1351 Last data filed at 06/25/15 [id number]  Gross per 24 hour  Intake 598.75 ml  Output   1052 ml  Net -453.25 ml    Exam:   General:  Pt is alert, follows commands appropriately, not in acute distress  Cardiovascular: Regular rhythm, tachycardic, no rubs, no gallops  Respiratory: Clear to auscultation bilaterally, no wheezing, diminished breath sounds at bases  Abdomen: Soft, non tender, non distended, bowel sounds present, no guarding  Data Reviewed: Basic Metabolic Panel:  Recent Labs Lab 06/21/15 0741 06/21/15 1239 06/22/15 0406 06/23/15 0720 06/24/15 0900 06/25/15 0648  NA 134*  --  136 141 135 136  K 3.5  --  3.6 3.4* 3.0* 3.1*  CL 95*  --  104 107 104 101  CO2 23  --  22 25 24 24   GLUCOSE 120*  --  96 100* 107* 110*  BUN 35*  --  33* 21* 13 13  CREATININE 5.91*  --   4.29* 1.82* 1.26* 1.19*  CALCIUM 9.0  --  7.9* 8.2* 7.8* 8.1*  MG  --  1.7  --   --   --  1.2*   Liver Function Tests:  Recent Labs Lab 06/21/15 0741 06/22/15 0406  AST 16 13*  ALT 22 14  ALKPHOS 119 87  BILITOT 1.2 0.8  PROT 7.8 6.0*  ALBUMIN 3.6 2.5*    Recent Labs Lab 06/21/15 0741  LIPASE 85*    CBC:  Recent Labs Lab 06/21/15 0741 06/21/15 1239 06/22/15 0406 06/23/15 0720 06/24/15 0900 06/25/15 0648  WBC 12.7* 11.1* 8.2 5.6 7.9 9.0  NEUTROABS 11.1*  --   --   --   --   --   HGB 9.7* 8.8* 7.8* 7.8* 7.8* 8.3*  HCT 30.3* 26.0* 23.3* 23.7* 24.1* 25.3*  MCV 85.4 83.9 85.0 84.9 84.9 83.8  PLT 522* 370 352 309 324 354   Recent Results (from the past 240 hour(s))  Urine culture     Status: None   Collection Time: 06/21/15  1:16 PM  Result Value Ref Range Status   Specimen Description URINE, CATHETERIZED  Final   Special Requests NONE  Final   Culture   Final    NO GROWTH 1 DAY Performed at [facility]    Report Status 06/22/2015 FINAL  Final  C difficile quick scan w PCR reflex     Status: None   Collection Time: 06/21/15  7:21 PM  Result Value Ref Range Status   C Diff antigen NEGATIVE NEGATIVE Final   C Diff toxin NEGATIVE NEGATIVE Final   C Diff interpretation Negative for toxigenic C. difficile  Final  Gastrointestinal Panel by PCR , Stool     Status: None   Collection Time: 06/21/15  7:21 PM  Result Value Ref Range Status   Campylobacter species NOT DETECTED NOT DETECTED Final   Plesimonas shigelloides NOT DETECTED NOT DETECTED Final   Salmonella species NOT DETECTED NOT DETECTED Final   Yersinia enterocolitica NOT DETECTED NOT DETECTED Final   Vibrio species NOT DETECTED NOT DETECTED Final   Vibrio cholerae NOT DETECTED NOT DETECTED Final   Enteroaggregative E coli (EAEC) NOT DETECTED NOT DETECTED Final   Enteropathogenic E coli (EPEC) NOT DETECTED NOT DETECTED Final   Enterotoxigenic E coli (ETEC) NOT DETECTED NOT DETECTED Final   Shiga  like toxin producing E coli (STEC) NOT DETECTED NOT DETECTED Final   E. coli O157 NOT DETECTED NOT DETECTED Final   Shigella/Enteroinvasive E coli (EIEC) NOT DETECTED NOT DETECTED Final   Cryptosporidium NOT DETECTED NOT DETECTED Final   Cyclospora cayetanensis NOT DETECTED NOT DETECTED Final   Entamoeba histolytica NOT DETECTED NOT DETECTED Final   Giardia lamblia NOT DETECTED NOT DETECTED Final   Adenovirus F40/41 NOT DETECTED NOT DETECTED Final   Astrovirus NOT DETECTED NOT DETECTED Final   Norovirus GI/GII NOT DETECTED NOT DETECTED Final   Rotavirus  A NOT DETECTED NOT DETECTED Final   Sapovirus (I, II, IV, and V) NOT DETECTED NOT DETECTED Final     Scheduled Meds: . apixaban  5 mg Oral BID  . aspirin  81 mg Oral QHS  . estradiol  0.05 mg Transdermal Weekly  . ferrous sulfate  325 mg Oral BID WC  . lip balm  1 application Topical BID  . loperamide  2 mg Oral QHS  . megestrol  400 mg Oral Daily  . ondansetron  4 mg Oral TID AC & HS  . pantoprazole  80 mg Oral Daily  . potassium chloride  40 mEq Oral Daily  . protein supplement shake  2 oz Oral QID  . sodium chloride  2,000 mL Intravenous Once  . sodium chloride flush  10-40 mL Intracatheter Q12H  . sodium chloride flush  3 mL Intravenous Q12H   Continuous Infusions:

## 2015-06-25 NOTE — Consult Note (Signed)
WOC ostomy follow up Stoma type/location: RLQ ileosotmy Stomal assessment/size: 1 and 1/8 inches oblong, proximal limb at 11 o'clock. diatal limb at 7 o'clock.. Peristomal assessment: 5cm x 1.5cm with depth unknown defect medially from 12-4 o'clock.  Seen today with Erby Pian, CCS NP for the purpose of photo documentation of mucocutaneous separation for record and for physician communication. Treatment options for stomal/peristomal skin: Today the defect is filled with pectin powder and this is covered with three "strips" of 1/2 skin barrier rings. A skin barrier ring is placed around the ostomy Output: dark brown liquid effluent  Ostomy pouching: 1pc.convex ostomy pouching system with 2.5 skin barrier rings. Education provided: Patient is queried about reluctance to explore SNF/Rehab Options post discharge for her deconditioned state and she reports that she has not ever refused this.  She feels that she may need more help than she can get at home until she gets stronger at this time.  I will communicate to Case Management. Enrolled patient in [place] Start Discharge program: Yes, previous admission [place] nursing team will follow, and will remain available to this patient, the nursing, surgical and medical teams.   Thanks, Maudie Flakes, MSN, RN, [place], Arther Abbott  Pager# [phone]

## 2015-06-25 NOTE — Consult Note (Signed)
WOC ostomy follow up Per internal medicine MD note, discharge was postponed yesterday PM due to vomiting.  [place] team will continue to follow and remain available to patient, medical and nursing teams.  Domenic Moras RN BSN [facility] Pager [phone]

## 2015-06-25 NOTE — Progress Notes (Addendum)
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 Resection of cystic mass near small bowel with serosal repair.  Surgeon(s): Brianna Boston, MD Brianna Ruff, MD - Assist   Diagnosis 1. Soft tissue, biopsy, abdominal wall cyst ? fistula BENIGN CYST AND REACTIVE CHANGES NEGATIVE FOR MALIGNANCY 2. Soft tissue mass, simple excision, Pelvic GIST GASTROINTESTINAL STROMAL TUMORS OF THE RECTUM SHOWING TREATMENT EFFECT (9.5 CM) MARGIN OF RESECTION IS POSITIVE 3. Colon, segmental resection for tumor, rectum DIVERTICULA NO RESIDUAL GASTROINTESTINAL STROMAL TUMOR IDENTIFIED MARGINS OF RESECTION ARE NEGATIVE FIFTEEN BENIGN LYMPH NODES (0/15) Microscopic Comment 2. GASTROINTESTINAL STROMAL TUMOR (GIST): Procedure: segmental resection Tumor Site: rectum Specify: pelvis rectum Tumor Size: 9.5 cm Tumor Focality Unifocal: focal GIST Subtype (spindle, epithelioid, mixed, etc.): spindle Mitotic Rate: 1 /50 HIGH POWER FIELD Necrosis: negative Histologic Grade 1 G1: Low grade; mitotic rate 5/50 HIGH POWER FIELD. G2: High grade; mitotic rate >5/50 HIGH POWER FIELD. Risk Assessment: _ Margins: positive Ancillary testing: Per request 1 of 3 FINAL for Brianna Walker, Brianna Walker  [phone]) Microscopic Comment(continued) Lymph nodes: number examined: 15; number positive: 0 Pathologic Staging: pT3 pN0 Comments: The tumor shows treatment effect with hypocellular, atrophic spindle cells and hyalinized, fibrotic stroma. less than 1% of the tumor without treatment effect. [name] MD Pathologist, Electronic Signature (Case signed 06/10/2015) Specimen Gross and Clinical Information Specimen(s) Obtained: 1. Soft tissue, biopsy, abdominal wall cyst ? fistula 2. Soft tissue mass, simple excision, Pelvic GIST 3. Colon, segmental resection for tumor, rectum Specimen Clinical Information 1. GIST tumor of rectum in pelvis (kp) Gross 1. In formalin is a a 3.1 x 1.4 x 0.3 cm irregular portion of fatty tissue, which on sectioning contains a 0.5 cm collapsed smooth cystic space. Sectioned and entirely submitted in one block. 2. Received in formalin is a 176 gram, 9.5 x 6.8 x 5.8 cm rubbery to firm mass, which has an attached suture over a 2.1 x 1.5 cm roughened area, clinically identifying anterior distal. This area is inked orange. There is also a 5.0 x 4.2 cm disrupted area, which is clinically identified as disrupted margin. This area is inked black. Also along one aspect is a 3.4 x 3.3 cm area of tan to hyperemic possible smooth mucosa. The cut surfaces of the mass are tan to tan yellow, with vaguely whorled cut surfaces, and scattered myxoid areas. Representative sections are submitted as follows: A, B = sections from clinically disrupted margin C = sections from area identified by suture as anterior distal D, E = central sections Total: 5 blocks submitted 3. Received in formalin is a 27 cm in length segment of colon, clinically recto-sigmoid, which includes at least probable middle third of rectum. Surrounding mesorectum is incomplete and focally disrupted. There is an attached suture over the anterior perirectal surface. The mucosa is tan pink, smooth, soft with  normal intestinal  folds. There are no mucosal lesions identified. Within the sigmoid portion are several unperforated diverticula. Found within the surrounding fat are 15 possible lymph nodes ranging from 0.1 to 0.6 cm. Block Summary: A = proximal margin B, C = distal margin D, E = sections taken at attached suture over anterior mesorectum F = diverticulum G = four nodes, whole H = four nodes, whole I = four nodes, whole J = three nodes, whole Total: 10 blocks submitted (SW:ds 06/07/15) 2 of 3 FINAL for Brianna Walker, Brianna Walker ([id number]) Report signed out from the following location(s) Technical component and interpretation was performed at [email or web address], [place], [place] [address]. CLIA #: [id number], 3 of 3 Assessment  RECURRENT N/V   Plan:  -Check gastric emptying study to r/o gastroparesis.  ?Reglan if concerning?  -ARF/dehydration - nearly resolved but poor PO intake.  Check orthostatic VS    -Poor PO with intermittent n/v & no evidence of obstruction.  Schedule Zofran.  Try Megace to stimulate appetite.  Bolus NS.  May need TNA  -Skin separation at ileostomy - ostomy care.  Reflection of malnutrition. OSTOMY CARE / TRAINING.  May need TNA if wound worsens.  >3 weeks postop = would treat as EC fistula & pouch & try to get nutrition better.  I woory attempt at ostomy revision would be even worse.  Can try to take ileostomy down if > 6 weeks & Albumin >3 to minimize risks of leak/breakdown  -replace low Mag & K - Mag very low  -antidiarrheal regimen as tolerated to avoid dehydration.  Challenging with her nausea issues.  Iron BID.  Imodium BID & then PRN  -VTE prophylaxis- SCDs, etc  -GERD - added back the PPI  -hyperlipidemia - Tx  -HTN  - No ACE Inh  -h/o PE - Eliquis as tolerated.    -mobilize as tolerated to help recovery.  GET HER UP!  -Pathology - ?+margin most likely fracture of GIST exposed.  Most likely will get gleevec post-adjuvant  for 3 years total per Dr Benay Spice.   D/C patient from hospital when patient meets criteria (anticipate in ???? day(s)):  Tolerating oral intake well Controlling diarrhea Ambulating well Adequate pain control without IV medications Urinating  Having flatus Disposition planning in place - Increase HH IVF via PICC qMWF to 2L (instead of 1L qMWF) Ostomy care/teaching   Adin [name], M.D., F.A.C.S. Gastrointestinal and Minimally Invasive Surgery [facility] [address], [place], [place] [address] [phone] Main / Paging   06/25/2015  Subjective:  Vomited just prior to d/c - disheartened. Stays in bed.  Claims to be walking OK No anorectal pain No pain at ileostomy  Appetite OK - cal counts fair - "nothing takes good"   Objective:  Vital signs:  Filed Vitals:   06/24/15 0433 06/24/15 1435 06/24/15 2137 06/25/15 0633  BP: 156/77 154/76 154/74 146/75  Pulse: 105 98 104 104  Temp: 98.3 F (36.8 C) 98.2 F (36.8 C) 98.8 F (37.1 C) 98.8 F (37.1 C)  TempSrc: Oral Oral Oral Oral  Resp: _0 Height:      Weight:      SpO2: 100% 99% 98% 100%    Last BM Date: 06/24/15  Intake/Output   Yesterday:  02/23 0701 - 02/24 0700 In: 598.8 [I.V.:598.8] Out: 1050 [Urine:800; Stool:250] This shift:     Bowel function:  Flatus: YES  BM: thin effluent in bag.   Drain:  n/a  Physical Exam:  General: Pt awake/alert/oriented x4 in no acute distress.  Smiling, alert Eyes: PERRL, normal EOM.  Sclera clear.  No icterus Neuro: CN II-XII intact w/o focal sensory/motor deficits. Lymph: No head/neck/groin lymphadenopathy Psych:  No delerium/psychosis/paranoia HENT: Normocephalic, Mucus membranes moist.  No thrush Neck: Supple, No tracheal deviation Chest: No chest wall pain w good excursion CV:  Pulses intact.  Regular rhythm MS: Normal AROM mjr joints.  No obvious deformity Abdomen: Soft but morbidly obese.  Nondistended.   Nontender.  No fluctuance/cellultis.  No guarding.  No evidence of peritonitis.  No incarcerated hernias.  Loop ileostomy pink with moderate thin effluent out superior orifice.  Medial skin separation moderate now.  No purulence/cellulitis..  No ostomy necrosis.   Rectal.  Decreased sphincter tone.  Stitches intact Ext:  SCDs BLE.  No mjr edema.  No cyanosis Skin: No petechiae / purpura  Results:   Labs: Results for orders placed or performed during the hospital encounter of 06/21/15 (from the past 48 hour(s))  CBC     Status: Abnormal   Collection Time: 06/23/15  7:20 AM  Result Value Ref Range   WBC 5.6 4.0 - 10.5 K/uL   RBC 2.79 (L) 3.87 - 5.11 MIL/uL   Hemoglobin 7.8 (L) 12.0 - 15.0 g/dL   HCT 23.7 (L) 36.0 - 46.0 %   MCV 84.9 78.0 - 100.0 fL   MCH 28.0 26.0 - 34.0 pg   MCHC 32.9 30.0 - 36.0 g/dL   RDW 15.1 11.5 - 15.5 %   Platelets 309 150 - 400 K/uL  Basic metabolic panel     Status: Abnormal   Collection Time: 06/23/15  7:20 AM  Result Value Ref Range   Sodium 141 135 - 145 mmol/L   Potassium 3.4 (L) 3.5 - 5.1 mmol/L   Chloride 107 101 - 111 mmol/L   CO2 25 22 - 32 mmol/L   Glucose, Bld 100 (H) 65 - 99 mg/dL   BUN 21 (H) 6 - 20 mg/dL    Comment: REPEATED TO VERIFY   Creatinine, Ser 1.82 (H) 0.44 - 1.00 mg/dL    Comment: DELTA CHECK NOTED REPEATED TO VERIFY    Calcium 8.2 (L) 8.9 - 10.3 mg/dL   GFR calc non Af Amer 28 (L) >60 mL/min   GFR calc Af Amer 32 (L) >60 mL/min    Comment: (NOTE) The eGFR has been calculated using the CKD EPI equation. This calculation has not been validated in all clinical situations. eGFR's persistently <60 mL/min signify possible Chronic Kidney Disease.    Anion gap 9 5 - 15  Basic metabolic panel     Status: Abnormal   Collection Time: 06/24/15  9:00 AM  Result Value Ref Range   Sodium 135 135 - 145 mmol/L   Potassium 3.0 (L) 3.5 - 5.1 mmol/L   Chloride 104 101 - 111 mmol/L   CO2 24 22 - 32 mmol/L   Glucose, Bld 107 (H) 65 - 99  mg/dL   BUN 13 6 - 20 mg/dL   Creatinine, Ser 1.26 (H) 0.44 - 1.00 mg/dL   Calcium 7.8 (L) 8.9 - 10.3 mg/dL   GFR calc non Af Amer 43 (L) >60 mL/min   GFR calc Af Amer 50 (L) >60 mL/min    Comment: (NOTE) The eGFR has been calculated using the CKD EPI equation. This calculation has not been validated in all clinical situations. eGFR's persistently <60 mL/min signify possible Chronic Kidney Disease.  Anion gap 7 5 - 15  CBC     Status: Abnormal   Collection Time: 06/24/15  9:00 AM  Result Value Ref Range   WBC 7.9 4.0 - 10.5 K/uL   RBC 2.84 (L) 3.87 - 5.11 MIL/uL   Hemoglobin 7.8 (L) 12.0 - 15.0 g/dL   HCT 24.1 (L) 36.0 - 46.0 %   MCV 84.9 78.0 - 100.0 fL   MCH 27.5 26.0 - 34.0 pg   MCHC 32.4 30.0 - 36.0 g/dL   RDW 15.1 11.5 - 15.5 %   Platelets 324 150 - 400 K/uL  CBC     Status: Abnormal   Collection Time: 06/25/15  6:48 AM  Result Value Ref Range   WBC 9.0 4.0 - 10.5 K/uL   RBC 3.02 (L) 3.87 - 5.11 MIL/uL   Hemoglobin 8.3 (L) 12.0 - 15.0 g/dL   HCT 25.3 (L) 36.0 - 46.0 %   MCV 83.8 78.0 - 100.0 fL   MCH 27.5 26.0 - 34.0 pg   MCHC 32.8 30.0 - 36.0 g/dL   RDW 15.1 11.5 - 15.5 %   Platelets 354 150 - 400 K/uL      Imaging / Studies: No results found.  Medications / Allergies: per chart  Antibiotics: Anti-infectives    None        Note: Portions of this report may have been transcribed using voice recognition software. Every effort was made to ensure accuracy; however, inadvertent computerized transcription errors may be present.   Any transcriptional errors that result from this process are unintentional.     Adin [name], M.D., F.A.C.S. Gastrointestinal and Minimally Invasive Surgery [facility] [address] [place], [place] [address] [phone] Main / Paging   06/25/2015  CARE TEAM:  PCP: Cathlean Cower, MD  Outpatient Care Team: Patient Care Team: Biagio Borg, MD as PCP - General Brianna Boston, MD as  Consulting Physician (General Surgery) Ladell Pier, MD as Consulting Physician (Oncology) Milus Banister, MD as Consulting Physician (Gastroenterology) Billy Fischer, MD as Consulting Physician (Family Medicine)  Inpatient Treatment Team: Treatment Team: Attending Provider: Theodis Blaze, MD; Consulting Physician: Brianna Boston, MD; Rounding Team: Ian Bushman, MD; Technician: Geoffery Spruce, NT; Technician: Guadalupe Maple, NT; Registered Nurse: Saunders Glance, RN; Registered Nurse: Cindy Hazy, RN; Technician: Sueanne Margarita, NT; Physical Therapist: Fuller Mandril, PT; Registered Nurse: Babs Sciara, RN

## 2015-06-25 NOTE — Care Management Important Message (Signed)
Important Message  Patient Details  Name: JACKELYN VANNEST MRN: [id number] Date of Birth: [birth year]   Medicare Important Message Given:  Yes    Camillo Flaming 06/25/2015, 1:10 PMImportant Message  Patient Details  Name: MASIAH PURGASON MRN: [id number] Date of Birth: 1946/01/02   Medicare Important Message Given:  Yes    Camillo Flaming 06/25/2015, 1:10 PM

## 2015-06-25 NOTE — Progress Notes (Signed)
PT Cancellation Note  Patient Details Name: Brianna Walker MRN: [id number] DOB: 14-Sep-1945   Cancelled Treatment:    Reason Eval/Treat Not Completed: Patient declined,(has walked, not up to it now.)   Claretha Cooper 06/25/2015, 2:31 PM Tresa Endo PT [phone]

## 2015-06-26 ENCOUNTER — Inpatient Hospital Stay (HOSPITAL_COMMUNITY): Payer: Commercial Managed Care - HMO

## 2015-06-26 DIAGNOSIS — E876 Hypokalemia: Secondary | ICD-10-CM

## 2015-06-26 DIAGNOSIS — N179 Acute kidney failure, unspecified: Principal | ICD-10-CM

## 2015-06-26 DIAGNOSIS — C49A4 Gastrointestinal stromal tumor of large intestine: Secondary | ICD-10-CM

## 2015-06-26 DIAGNOSIS — D509 Iron deficiency anemia, unspecified: Secondary | ICD-10-CM

## 2015-06-26 LAB — COMPREHENSIVE METABOLIC PANEL
ALK PHOS: 177 U/L — AB (ref 38–126)
ALT: 42 U/L (ref 14–54)
ANION GAP: 10 (ref 5–15)
AST: 39 U/L (ref 15–41)
Albumin: 2.4 g/dL — ABNORMAL LOW (ref 3.5–5.0)
BILIRUBIN TOTAL: 1.2 mg/dL (ref 0.3–1.2)
BUN: 14 mg/dL (ref 6–20)
CALCIUM: 8.4 mg/dL — AB (ref 8.9–10.3)
CO2: 27 mmol/L (ref 22–32)
Chloride: 99 mmol/L — ABNORMAL LOW (ref 101–111)
Creatinine, Ser: 1.21 mg/dL — ABNORMAL HIGH (ref 0.44–1.00)
GFR, EST AFRICAN AMERICAN: 52 mL/min — AB (ref >60)
GFR, EST NON AFRICAN AMERICAN: 45 mL/min — AB (ref >60)
GLUCOSE: 145 mg/dL — AB (ref 65–99)
POTASSIUM: 2.9 mmol/L — AB (ref 3.5–5.1)
Sodium: 136 mmol/L (ref 135–145)
TOTAL PROTEIN: 6.7 g/dL (ref 6.5–8.1)

## 2015-06-26 LAB — DIFFERENTIAL
BASOS ABS: 0 10*3/uL (ref 0.0–0.1)
BASOS PCT: 0 %
EOS ABS: 0.1 10*3/uL (ref 0.0–0.7)
EOS PCT: 1 %
LYMPHS ABS: 0.7 10*3/uL (ref 0.7–4.0)
Lymphocytes Relative: 9 %
Monocytes Absolute: 0.9 10*3/uL (ref 0.1–1.0)
Monocytes Relative: 11 %
Neutro Abs: 6.4 10*3/uL (ref 1.7–7.7)
Neutrophils Relative %: 79 %

## 2015-06-26 LAB — MAGNESIUM: MAGNESIUM: 2 mg/dL (ref 1.7–2.4)

## 2015-06-26 LAB — GLUCOSE, CAPILLARY
Glucose-Capillary: 127 mg/dL — ABNORMAL HIGH (ref 65–99)
Glucose-Capillary: 123 mg/dL — ABNORMAL HIGH (ref 65–99)
Glucose-Capillary: 108 mg/dL — ABNORMAL HIGH (ref 65–99)
Glucose-Capillary: 118 mg/dL — ABNORMAL HIGH (ref 65–99)

## 2015-06-26 LAB — CBC
HCT: 24.5 % — ABNORMAL LOW (ref 36.0–46.0)
HEMOGLOBIN: 8 g/dL — AB (ref 12.0–15.0)
MCH: 27.2 pg (ref 26.0–34.0)
MCHC: 32.7 g/dL (ref 30.0–36.0)
MCV: 83.3 fL (ref 78.0–100.0)
PLATELETS: 325 10*3/uL (ref 150–400)
RBC: 2.94 MIL/uL — ABNORMAL LOW (ref 3.87–5.11)
RDW: 15.3 % (ref 11.5–15.5)
WBC: 8.1 10*3/uL (ref 4.0–10.5)

## 2015-06-26 LAB — PREALBUMIN: PREALBUMIN: 7.6 mg/dL — AB (ref 18–38)

## 2015-06-26 LAB — PHOSPHORUS: PHOSPHORUS: 2.3 mg/dL — AB (ref 2.5–4.6)

## 2015-06-26 LAB — TRIGLYCERIDES: TRIGLYCERIDES: 119 mg/dL (ref <150)

## 2015-06-26 MED ORDER — TECHNETIUM TC 99M SULFUR COLLOID
2.0000 | Freq: Once | INTRAVENOUS | Status: AC | PRN
  Administered 2015-06-26: 2 via INTRAVENOUS

## 2015-06-26 MED ORDER — KETOROLAC TROMETHAMINE 15 MG/ML IJ SOLN
15.0000 mg | Freq: Once | INTRAMUSCULAR | Status: AC
  Administered 2015-06-26: 15 mg via INTRAVENOUS
  Filled 2015-06-26: qty 1

## 2015-06-26 MED ORDER — FAT EMULSION 20 % IV EMUL
240.0000 mL | INTRAVENOUS | Status: AC
  Administered 2015-06-26: 240 mL via INTRAVENOUS
  Filled 2015-06-26: qty 250

## 2015-06-26 MED ORDER — TRACE MINERALS CR-CU-MN-SE-ZN 10-1000-500-60 MCG/ML IV SOLN
INTRAVENOUS | Status: AC
  Administered 2015-06-26: 18:00:00 via INTRAVENOUS
  Filled 2015-06-26: qty 960

## 2015-06-26 MED ORDER — POTASSIUM PHOSPHATES 15 MMOLE/5ML IV SOLN
15.0000 mmol | Freq: Once | INTRAVENOUS | Status: AC
  Administered 2015-06-26: 15 mmol via INTRAVENOUS
  Filled 2015-06-26: qty 5

## 2015-06-26 NOTE — Progress Notes (Signed)
General Surgery [facility]  Patient off [address] in radiology for gastric emptying study - will take several hours today.  Requests pain Rx - cannot give narcotics due to GES.  Ordered single dose of Toradol IV.  Will follow.  Earnstine Regal, MD, [facility] Office: [phone]

## 2015-06-26 NOTE — Clinical Social Work Note (Addendum)
CSW received referral for SNF.  Case discussed with case manager, and plan is to discharge home with home health.  CSW to sign off please re-consult if social work needs arise.  Jacquelyn Shadrick R. Frimy Uffelman, MSW, LCSWA [phone]  

## 2015-06-26 NOTE — Progress Notes (Signed)
PARENTERAL NUTRITION CONSULT NOTE - FOLLOW UP  Pharmacy Consult for TPN Indication: intolerance to tube feeds  No Known Allergies  Patient Measurements: Height: '5\' 3"'  (160 cm) Weight: 202 lb 13.2 oz (92 kg) IBW/kg (Calculated) : 52.4 Adjusted Body Weight: 68kg Usual Weight: 102kg (lost 24h in last month)  Vital Signs: Temp: 99.1 F (37.3 C) (02/25 0500) Temp Source: Oral (02/25 0500) BP: 107/56 mmHg (02/25 0500) Pulse Rate: 97 (02/25 0500) Intake/Output from previous day: 02/24 0701 - 02/25 0700 In: 786 [P.O.:360; TPN:426] Out: 253 [Urine:2; Stool:251] Intake/Output from this shift:    Labs:  Recent Labs  06/24/15 0900 06/25/15 0648 06/26/15 0441  WBC 7.9 9.0 8.1  HGB 7.8* 8.3* 8.0*  HCT 24.1* 25.3* 24.5*  PLT 324 354 325     Recent Labs  06/25/15 0648 06/25/15 1350 06/26/15 0441  NA 136 136 136  K 3.1* 3.0* 2.9*  CL 101 102 99*  CO2 '24 23 27  ' GLUCOSE 110* 120* 145*  BUN '13 12 14  ' CREATININE 1.19* 1.08* 1.21*  CALCIUM 8.1* 8.0* 8.4*  MG 1.2*  --  2.0  PHOS  --   --  2.3*  PROT  --   --  6.7  ALBUMIN  --   --  2.4*  AST  --   --  39  ALT  --   --  42  ALKPHOS  --   --  177*  BILITOT  --   --  1.2   Estimated Creatinine Clearance: 48.6 mL/min (by C-G formula based on Cr of 1.21).    Recent Labs  06/25/15 2354 06/26/15 0317 06/26/15 0641  GLUCAP 140* 127* 123*    Insulin Requirements:  Moderate SSI - 1 unit Novolog  Current Nutrition: on megace Soft diet - refused premier protein  last pm  IVF: none  Central access: PICC 2/7 TPN start date:  2/24  ASSESSMENT                                                                                                    HPI: [name] s/p laparoscopic and robotic resection of GIST TUMOR lysis of adhesions on 2/3. She was discharged on 2/17, but returned 2/20 with nausea vomiting, high ileostomy output, poor appetite and subjective fevers. She continues with poor oral intake and nausea/vomiting. Also  positive for skin breakdown at ostomy site as a result of malnutrition. No evidence of obstruction per surgery. Pharmacy consulted to start parenteral nutrition.   Significant events:  2/25: gastric emptying study  Today:    Glucose - at goal < 150 mg/dl  Electrolytes - K low at 2.9 (orders for KCl 77mq daily), phos low at 2.3, mag WNL, Corr Ca WNL  Renal - SCr mildly elevated at 1.21  LFTs - Alk phos elevated at baseline (prior to TPN), otherwise WNL  TGs - 119 (2/25)  Prealbumin - 7.6  (2/25)  NUTRITIONAL GOALS  RD recs: 2200-2400 kcal, 120-130 gm protein Clinimix 5/15 at a goal rate of 140m/hr + 20% fat emulsion at 128mhr to provide: 126 g/day protein, 2269Kcal/day.  PLAN                                                                                                                         Now:   Kphos 1561m x1 IVPB (also provides 65m41m+)  Follow-up ability to take PO KCl supplement  At 1800 today:  Continue Clinimix E5/15 at 40 ml/hr.  20% fat emulsion at 10ml70m  Plan to advance as tolerated to the goal rate.  Will not advance TPN tonight out of concern for possible refeeding syndrome.  Replace lytes with hopes of advancing TPN 2/26  TPN to contain standard multivitamins and trace elements.  Continue mod SSI as ordered   TPN lab panels on Mondays & Thursdays.  F/u daily.  DustiDoreene ElandrmD, BCPS.   Pager: [phone]/2017 8:53 AM

## 2015-06-26 NOTE — Progress Notes (Signed)
Patient ID: Brianna Walker, female   DOB: [birth year], 70 y.o.   MRN: [id number] [facility] PROGRESS NOTE  Brianna Walker [id number] DOB: 1946/04/12 DOA: 06/21/2015 PCP: Cathlean Cower, MD  Brief narrative:    69 year old female status post laparoscopic and robotic resection of GIST TUMOR with lysis of adhesions on 2/3, discharged on 2/17. Pt presented to [facility] ED for evaluation of nausea vomiting, high ileostomy output, poor appetite and subjective fevers. General surgery consulted due to recent surgery. CT abdomen pelvis showed postsurgical changes, postsurgical ileus vs partial small bowel obstruction, small hiatal hernia. Chest x-ray was negative. Creatinine has increased from 0.98 to 5.91.   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    Iron deficiency anemia / Anemia of chronic disease  - IDA and ACD secondary to history of GIST tumor - Continue ferrous sulfate supplementation    Protein-calorie malnutrition, severe (H[facility]) - As tolerated   DVT Prophylaxis  - On full dose AC with apixaban   Code Status: Full.  Family Communication:  plan of care discussed with the patient and her son at the bedside  Disposition Plan: Home once she feels better, possibly by 2/28  IV access:  Peripheral IV  Procedures and  diagnostic studies:    Ct Abdomen Pelvis Wo Contrast 06/21/2015   1. Stable postsurgical changes, as described above. 2. Mildly improved postsurgical jejunal ileus or partial obstruction, with transition in the left mid abdomen. This could be an indication of an adhesion. 3. Small hiatal hernia. 4. Colonic diverticulosis. Electronically Signed   By: Claudie Revering M.D.   On: 06/21/2015 11:19   Nm Gastric Emptying 06/26/2015  Significantly delayed  gastric emptying study. Electronically Signed   By: Rolm Baptise M.D.   On: 06/26/2015 13:31  Dg Chest Portable 1 View 06/21/2015  Stable.  No acute findings. Electronically Signed   By: Misty Stanley M.D.   On: 06/21/2015 18:50   Dg Chest Port 1 View 06/21/2015 No acute cardiopulmonary disease. Electronically Signed   By: Lucrezia Europe M.D.   On: 06/21/2015 12:43    Medical Consultants:  Surgery   Other Consultants:  None   IAnti-Infectives:   None    Leisa Lenz, MD  [facility] Pager [phone]  Time spent in minutes: 25 minutes  If 7PM-7AM, please contact night-coverage [email or web address] Password [facility] 06/26/2015, 7:23 PM   LOS: 5 days    HPI/Subjective: No acute overnight events. Patient reports no vomiting today.   Objective: Filed Vitals:   06/25/15 1417 06/25/15 2210 06/26/15 0500 06/26/15 1349  BP: 148/71 120/63 107/56 128/68  Pulse: 111 108 97 92  Temp: 98.6 F (37 C) 98.9 F (37.2 C) 99.1 F (37.3 C) 98.5 F (36.9 C)  TempSrc: Oral Oral Oral Oral  Resp: 20 18 18    Height:  Weight:      SpO2: 98% 94% 98% 99%    Intake/Output Summary (Last 24 hours) at 06/26/15 1923 Last data filed at 06/26/15 1400  Gross per 24 hour  Intake 1200.99 ml  Output    800 ml  Net 400.99 ml    Exam:   General:  Pt is alert, follows commands appropriately, not in acute distress  Cardiovascular: Regular rate and rhythm, S1/S2 appreciated   Respiratory: Clear to auscultation bilaterally, no wheezing, no crackles, no  rhonchi  Abdomen: Soft, non tender, non distended, bowel sounds present  Extremities: No edema, pulses palpable bilaterally  Neuro: Grossly nonfocal  Data Reviewed: Basic Metabolic Panel:  Recent Labs Lab 06/21/15 1239  06/23/15 0720 06/24/15 0900 06/25/15 0648 06/25/15 1350 06/26/15 0441  NA  --   < > 141 135 136 136 136  K  --   < > 3.4* 3.0* 3.1* 3.0* 2.9*  CL  --   < > 107 104 101 102 99*  CO2  --   < > 25 24 24 23 27   GLUCOSE  --   < > 100* 107* 110* 120* 145*  BUN  --   < > 21* 13 13 12 14   CREATININE  --   < > 1.82* 1.26* 1.19* 1.08* 1.21*  CALCIUM  --   < > 8.2* 7.8* 8.1* 8.0* 8.4*  MG 1.7  --   --   --  1.2*  --  2.0  PHOS  --   --   --   --   --   --  2.3*  < > = values in this interval not displayed. Liver Function Tests:  Recent Labs Lab 06/21/15 0741 06/22/15 0406 06/26/15 0441  AST 16 13* 39  ALT 22 14 42  ALKPHOS 119 87 177*  BILITOT 1.2 0.8 1.2  PROT 7.8 6.0* 6.7  ALBUMIN 3.6 2.5* 2.4*    Recent Labs Lab 06/21/15 0741  LIPASE 85*   No results for input(s): AMMONIA in the last 168 hours. CBC:  Recent Labs Lab 06/21/15 0741  06/22/15 0406 06/23/15 0720 06/24/15 0900 06/25/15 0648 06/26/15 0441  WBC 12.7*  < > 8.2 5.6 7.9 9.0 8.1  NEUTROABS 11.1*  --   --   --   --   --  6.4  HGB 9.7*  < > 7.8* 7.8* 7.8* 8.3* 8.0*  HCT 30.3*  < > 23.3* 23.7* 24.1* 25.3* 24.5*  MCV 85.4  < > 85.0 84.9 84.9 83.8 83.3  PLT 522*  < > 352 309 324 354 325  < > = values in this interval not displayed. Cardiac Enzymes: No results for input(s): CKTOTAL, CKMB, CKMBINDEX, TROPONINI in the last 168 hours. BNP: Invalid input(s): POCBNP CBG:  Recent Labs Lab 06/25/15 2354 06/26/15 0317 06/26/15 0641 06/26/15 1421 06/26/15 1817  GLUCAP 140* 127* 123* 108* 118*    Recent Results (from the past 240 hour(s))  Urine culture     Status: None   Collection Time: 06/21/15  1:16 PM  Result Value Ref Range Status   Specimen Description URINE, CATHETERIZED   Final   Special Requests NONE  Final   Culture   Final    NO GROWTH 1 DAY Performed at [facility]    Report Status 06/22/2015 FINAL  Final  C difficile quick scan w PCR reflex     Status: None   Collection Time: 06/21/15  7:21 PM  Result Value Ref Range Status   C  Diff antigen NEGATIVE NEGATIVE Final   C Diff toxin NEGATIVE NEGATIVE Final   C Diff interpretation Negative for toxigenic C. difficile  Final  Gastrointestinal Panel by PCR , Stool     Status: None   Collection Time: 06/21/15  7:21 PM  Result Value Ref Range Status   Campylobacter species NOT DETECTED NOT DETECTED Final   Plesimonas shigelloides NOT DETECTED NOT DETECTED Final   Salmonella species NOT DETECTED NOT DETECTED Final   Yersinia enterocolitica NOT DETECTED NOT DETECTED Final   Vibrio species NOT DETECTED NOT DETECTED Final   Vibrio cholerae NOT DETECTED NOT DETECTED Final   Enteroaggregative E coli (EAEC) NOT DETECTED NOT DETECTED Final   Enteropathogenic E coli (EPEC) NOT DETECTED NOT DETECTED Final   Enterotoxigenic E coli (ETEC) NOT DETECTED NOT DETECTED Final   Shiga like toxin producing E coli (STEC) NOT DETECTED NOT DETECTED Final   E. coli O157 NOT DETECTED NOT DETECTED Final   Shigella/Enteroinvasive E coli (EIEC) NOT DETECTED NOT DETECTED Final   Cryptosporidium NOT DETECTED NOT DETECTED Final   Cyclospora cayetanensis NOT DETECTED NOT DETECTED Final   Entamoeba histolytica NOT DETECTED NOT DETECTED Final   Giardia lamblia NOT DETECTED NOT DETECTED Final   Adenovirus F40/41 NOT DETECTED NOT DETECTED Final   Astrovirus NOT DETECTED NOT DETECTED Final   Norovirus GI/GII NOT DETECTED NOT DETECTED Final   Rotavirus A NOT DETECTED NOT DETECTED Final   Sapovirus (I, II, IV, and V) NOT DETECTED NOT DETECTED Final     Scheduled Meds: . apixaban  5 mg Oral BID  . aspirin  81 mg Oral QHS  . estradiol  0.05 mg Transdermal Weekly  . ferrous sulfate  325 mg Oral BID WC  . insulin aspart  0-15  Units Subcutaneous 4 times per day  . loperamide  2 mg Oral QHS  . megestrol  400 mg Oral Daily  . ondansetron  4 mg Oral TID AC & HS  . pantoprazole  80 mg Oral Daily  . potassium chloride  40 mEq Oral Daily  . potassium phosphate IVPB (mmol)  15 mmol Intravenous Once  . protein supplement shake  2 oz Oral QID   Continuous Infusions: . Marland KitchenTPN (CLINIMIX-E) Adult Stopped (06/26/15 1739)   And  . fat emulsion Stopped (06/26/15 1742)  . Marland KitchenTPN (CLINIMIX-E) Adult 40 mL/hr at 06/26/15 1741   And  . fat emulsion 240 mL (06/26/15 1740)

## 2015-06-27 LAB — GLUCOSE, CAPILLARY
GLUCOSE-CAPILLARY: 106 mg/dL — AB (ref 65–99)
GLUCOSE-CAPILLARY: 115 mg/dL — AB (ref 65–99)
GLUCOSE-CAPILLARY: 142 mg/dL — AB (ref 65–99)
Glucose-Capillary: 109 mg/dL — ABNORMAL HIGH (ref 65–99)
Glucose-Capillary: 109 mg/dL — ABNORMAL HIGH (ref 65–99)

## 2015-06-27 LAB — PHOSPHORUS: Phosphorus: 3.3 mg/dL (ref 2.5–4.6)

## 2015-06-27 LAB — MAGNESIUM: Magnesium: 1.8 mg/dL (ref 1.7–2.4)

## 2015-06-27 LAB — BASIC METABOLIC PANEL
Anion gap: 11 (ref 5–15)
BUN: 20 mg/dL (ref 6–20)
CO2: 27 mmol/L (ref 22–32)
Calcium: 8.8 mg/dL — ABNORMAL LOW (ref 8.9–10.3)
Chloride: 100 mmol/L — ABNORMAL LOW (ref 101–111)
Creatinine, Ser: 1.34 mg/dL — ABNORMAL HIGH (ref 0.44–1.00)
GFR calc Af Amer: 46 mL/min — ABNORMAL LOW (ref >60)
GFR calc non Af Amer: 40 mL/min — ABNORMAL LOW (ref >60)
GLUCOSE: 115 mg/dL — AB (ref 65–99)
POTASSIUM: 2.9 mmol/L — AB (ref 3.5–5.1)
Sodium: 138 mmol/L (ref 135–145)

## 2015-06-27 MED ORDER — POTASSIUM CHLORIDE CRYS ER 20 MEQ PO TBCR: 40.0000 meq | EXTENDED_RELEASE_TABLET | Freq: Two times a day (BID) | ORAL | Status: DC

## 2015-06-27 MED ORDER — METOCLOPRAMIDE HCL 10 MG PO TABS
10.0000 mg | ORAL_TABLET | Freq: Three times a day (TID) | ORAL | Status: DC
  Administered 2015-06-27 – 2015-06-29 (×6): 10 mg via ORAL
  Filled 2015-06-27 (×10): qty 1

## 2015-06-27 MED ORDER — POTASSIUM CHLORIDE 10 MEQ/100ML IV SOLN
10.0000 meq | INTRAVENOUS | Status: DC
  Administered 2015-06-27 (×6): 10 meq via INTRAVENOUS
  Filled 2015-06-27 (×6): qty 100

## 2015-06-27 MED ORDER — POTASSIUM CHLORIDE CRYS ER 20 MEQ PO TBCR
40.0000 meq | EXTENDED_RELEASE_TABLET | Freq: Two times a day (BID) | ORAL | Status: AC
  Administered 2015-06-28 – 2015-06-29 (×3): 40 meq via ORAL
  Filled 2015-06-27 (×5): qty 2

## 2015-06-27 MED ORDER — FAT EMULSION 20 % IV EMUL
240.0000 mL | INTRAVENOUS | Status: AC
  Administered 2015-06-27: 240 mL via INTRAVENOUS
  Filled 2015-06-27: qty 250

## 2015-06-27 MED ORDER — HYDROMORPHONE HCL 1 MG/ML IJ SOLN
0.5000 mg | INTRAMUSCULAR | Status: DC | PRN
  Administered 2015-06-27 (×3): 0.5 mg via INTRAVENOUS
  Administered 2015-06-27 – 2015-06-28 (×3): 1 mg via INTRAVENOUS
  Filled 2015-06-27 (×6): qty 1

## 2015-06-27 MED ORDER — TRACE MINERALS CR-CU-MN-SE-ZN 10-1000-500-60 MCG/ML IV SOLN
INTRAVENOUS | Status: AC
  Administered 2015-06-27: 17:00:00 via INTRAVENOUS
  Filled 2015-06-27: qty 1440

## 2015-06-27 NOTE — Progress Notes (Addendum)
Nutrition Follow-up  DOCUMENTATION CODES:   Severe malnutrition in context of acute illness/injury, Obesity unspecified  INTERVENTION:   Monitor magnesium, potassium, and phosphorus daily for at least 3 days, MD to replete as needed, as pt is at risk for refeeding syndrome given severe malnutrition status and poor PO intake.  -TPN per Pharmacy -Encourage PO intake of clears -Provided "Gastroparesis Nutrition Therapy" handout with pt and husband. -D/c Premier Protein as pt is on clear liquids. Will resume once past clears. -RD to continue to monitor  NUTRITION DIAGNOSIS:   Inadequate oral intake related to poor appetite as evidenced by per patient/family report, meal completion < 25%.   Clear liquid diet now.  GOAL:   Patient will meet greater than or equal to 90% of their needs  Progressing.  MONITOR:   PO intake, Diet advancement, Labs, Weight trends, Skin, I & O's, Other (Comment) (TPN )  REASON FOR ASSESSMENT:   Consult Diet education  ASSESSMENT:   70 year old female status post laparoscopic and robotic resection of GIST TUMOR lysis of adhesions on 2/3, discharged on 2/17, presents to the ER today with acute renal failure, dehydration, nausea vomiting, high ileostomy output, poor appetite and subjective fevers. General surgery consulted due to recent surgery. CT abdomen pelvis shows postsurgical changes, postsurgical ileus vs partial small bowel obstruction, small hiatal hernia.  Patient was started on TPN 2/24. Pt was expected to discharge 2/23 but developed some vomiting. Pt had gastric emptying study on 2/25 which revealed pt with delayed gastric emptying. Pt now on clear liquids. She denies any nausea today. She is not taking in much, states she has been sipping on cran-grape juice today. TPN is at 48ml/hr, will advance slowly. Pt is at refeeding risk given severe malnutrition and poor PO intake. Pt has been started on Megace which has improved her appetite but  patient was having significant pain during RD visit. Pt reports she received medication for the pain just prior to seeing RD. RD provided more cran-grape juice for patient.  Provided "Gastroparesis Nutrition Therapy" handout with pt and husband. Briefly reviewed diet. Encouraged both pt and husband to review handout and to think of questions for RD follow-up.  Medications: ferrous sulfate tablet BID, Megace daily, Zofran tablet QID, KCl IV, K-DUR tablet daily Labs reviewed: Low K Mg/Phos WNL  Plan per Pharmacy 2/26: Now:   KCL x 6 runs for low K+, ? If absorbing PO KCl  At 1800 today:  Slowly advance Clinimix E5/15, increase rate to 60 ml/hr tonight  20% fat emulsion at 30ml/hr.  Plan to advance as tolerated to the goal rate.  TPN goal: Clinimix 5/15 at a goal rate of 127ml/hr + 20% fat emulsion at 8ml/hr to provide: 126 g/day protein, 2269Kcal/day.  Diet Order:  Diet - low sodium heart healthy TPN (CLINIMIX-E) Adult Diet clear liquid Room service appropriate?: Yes; Fluid consistency:: Thin TPN (CLINIMIX-E) Adult  Skin:  Wound (see comment) (skin breakdown around ostomy)  Last BM:  2/26  Height:   Ht Readings from Last 1 Encounters:  06/22/15 5\' 3"  (1.6 m)    Weight:   Wt Readings from Last 1 Encounters:  06/22/15 202 lb 13.2 oz (92 kg)    Ideal Body Weight:  52.27 kg (kg)  BMI:  Body mass index is 35.94 kg/(m^2).  Estimated Nutritional Needs:   Kcal:  2200-2400  Protein:  120-130 grams  Fluid:  >/= 2.8 L/day  EDUCATION NEEDS:   Education needs addressed  Clayton Bibles, MS, RD, LDN  Pager: [phone] After Hours Pager: [phone]

## 2015-06-27 NOTE — Progress Notes (Signed)
Patient ID: Brianna Walker, female   DOB: 1945/12/14, 70 y.o.   MRN: [id number]  [facility]  Subjective: Patient in bed, comfortable.  Husband at bedside.  Episode of emesis earlier this AM.  Denies pain now.  Objective: Vital signs in last 24 hours: Temp:  [98.5 F (36.9 C)-98.9 F (37.2 C)] 98.7 F (37.1 C) (02/26 0556) Pulse Rate:  [92-102] 102 (02/26 0556) Resp:  [16-18] 16 (02/26 0556) BP: (111-128)/(60-68) 111/60 mmHg (02/26 0556) SpO2:  [97 %-99 %] 97 % (02/26 0556) Last BM Date: 06/27/15  Intake/Output from previous day: 02/25 0701 - 02/26 0700 In: 1375 [P.O.:325; TPN:1050] Out: 625 [Urine:550; Stool:75] Intake/Output this shift:    Physical Exam: HEENT - sclerae clear, mucous membranes moist Neck - soft Chest - clear bilaterally Cor - RRR Abdomen - soft, obese; surgical wounds well healed; ostomy right lower abdomen with liquid succus in bag; non-tender; BS present Ext - no edema, non-tender Neuro - alert & oriented, no focal deficits  Lab Results:   Recent Labs  06/25/15 0648 06/26/15 0441  WBC 9.0 8.1  HGB 8.3* 8.0*  HCT 25.3* 24.5*  PLT 354 325   BMET  Recent Labs  06/26/15 0441 06/27/15 0448  NA 136 138  K 2.9* 2.9*  CL 99* 100*  CO2 27 27  GLUCOSE 145* 115*  BUN 14 20  CREATININE 1.21* 1.34*  CALCIUM 8.4* 8.8*   PT/INR No results for input(s): LABPROT, INR in the last 72 hours. Comprehensive Metabolic Panel:    Component Value Date/Time   NA 138 06/27/2015 0448   NA 136 06/26/2015 0441   NA 143 05/05/2015 1412   NA 142 04/01/2015 1419   K 2.9* 06/27/2015 0448   K 2.9* 06/26/2015 0441   K 3.5 05/05/2015 1412   K 3.9 04/01/2015 1419   CL 100* 06/27/2015 0448   CL 99* 06/26/2015 0441   CO2 27 06/27/2015 0448   CO2 27 06/26/2015 0441   CO2 23 05/05/2015 1412   CO2 24 04/01/2015 1419   BUN 20 06/27/2015 0448   BUN 14 06/26/2015 0441   BUN 13.8 05/05/2015 1412   BUN 8.5 04/01/2015 1419   CREATININE 1.34* 06/27/2015 0448   CREATININE 1.21* 06/26/2015 0441   CREATININE 1.2* 05/05/2015 1412   CREATININE 1.0 04/01/2015 1419   GLUCOSE 115* 06/27/2015 0448   GLUCOSE 145* 06/26/2015 0441   GLUCOSE 99 05/05/2015 1412   GLUCOSE 83 04/01/2015 1419   GLUCOSE 109* 04/26/2006 1434   CALCIUM 8.8* 06/27/2015 0448   CALCIUM 8.4* 06/26/2015 0441   CALCIUM 8.5 05/05/2015 1412   CALCIUM 8.8 04/01/2015 1419   AST 39 06/26/2015 0441   AST 13* 06/22/2015 0406   AST 19 05/05/2015 1412   AST 17 04/01/2015 1419   ALT 42 06/26/2015 0441   ALT 14 06/22/2015 0406   ALT 13 05/05/2015 1412   ALT 9 04/01/2015 1419   ALKPHOS 177* 06/26/2015 0441   ALKPHOS 87 06/22/2015 0406   ALKPHOS 111 05/05/2015 1412   ALKPHOS 110 04/01/2015 1419   BILITOT 1.2 06/26/2015 0441   BILITOT 0.8 06/22/2015 0406   BILITOT 0.86 05/05/2015 1412   BILITOT 0.64 04/01/2015 1419   PROT 6.7 06/26/2015 0441   PROT 6.0* 06/22/2015 0406   PROT 7.7 05/05/2015 1412   PROT 7.8 04/01/2015 1419   ALBUMIN 2.4* 06/26/2015 0441   ALBUMIN 2.5* 06/22/2015 0406   ALBUMIN 3.5 05/05/2015 1412   ALBUMIN 3.5 04/01/2015 1419  Studies/Results: Nm Gastric Emptying  06/26/2015  CLINICAL DATA:  Post laparoscopic an robotic resection of gist tumor. Nausea, vomiting. EXAM: NUCLEAR MEDICINE GASTRIC EMPTYING SCAN TECHNIQUE: After oral ingestion of radiolabeled meal, sequential abdominal images were obtained for 4 hours. Percentage of activity emptying the stomach was calculated at 1 hour, 2 hour, 3 hour, and 4 hours. RADIOPHARMACEUTICALS:  2.0 mCi Tc-79m MDP labeled sulfur colloid orally COMPARISON:  CT 06/21/2015 FINDINGS: Expected location of the stomach in the left upper quadrant. Ingested meal empties the stomach slowly over the course of the study. 17% emptied at 1 hr ( normal >= 10%) 19% Emptied at 2 hr ( normal >= 40%) 13% emptied at 3 hr ( normal >= 70%) 23% emptied at 4 hr ( normal >= 90%) IMPRESSION: Significantly delayed  gastric  emptying study. Electronically Signed   By: Rolm Baptise M.D.   On: 06/26/2015 13:31    Assessment & Plans: Delayed gastric emptying  Persistent nausea and emesis overnight  GES = 23% emptied at 4 hr ( normal >= 90%) - IMPRESSION: Significantly delayed gastric emptying study  May need prokinetic agent - consider consultation with her gastroenterologist, Dr. Lucio Edward Status post LAR for GIST  Monitor stoma and output  Will follow.  Dr. Johney Maine to see in AM 2/27.  Earnstine Regal, MD, [facility] Office: [place] 06/27/2015

## 2015-06-27 NOTE — Progress Notes (Signed)
Patient ID: Brianna Walker, female   DOB: 19-Sep-1945, 70 y.o.   MRN: [id number] [facility] PROGRESS NOTE  Brianna Walker [id number] DOB: 03-03-1946 DOA: 06/21/2015 PCP: Cathlean Cower, MD  Brief narrative:    70 year old female status post laparoscopic and robotic resection of GIST TUMOR with lysis of adhesions on 2/3, discharged on 2/17. Pt presented to [facility] [place] ED for evaluation of nausea vomiting, high ileostomy output, poor appetite and subjective fevers. General surgery consulted due to recent surgery. CT abdomen pelvis showed postsurgical changes, postsurgical ileus vs partial small bowel obstruction, small hiatal hernia. Chest x-ray was negative. Creatinine has increased from 0.98 to 5.91.   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    Iron deficiency anemia / Anemia of chronic disease  - IDA and ACD secondary to history of GIST tumor - Continue ferrous sulfate supplementation    Protein-calorie malnutrition, severe (HCC) - As tolerated     DVT Prophylaxis  - On full dose AC with apixaban   Code Status: Full.  Family Communication:  plan of care discussed with the patient  and her son at the bedside  Disposition Plan: Home once she feels better, possibly by 2/28  IV access:  Peripheral IV  Procedures and diagnostic studies:    Ct Abdomen Pelvis Wo Contrast 06/21/2015   1. Stable postsurgical changes, as described above. 2. Mildly improved postsurgical jejunal ileus or partial obstruction, with transition in the left mid abdomen. This could be an indication of an adhesion. 3. Small hiatal hernia. 4. Colonic diverticulosis. Electronically Signed   By: Claudie Revering M.D.   On: 06/21/2015 11:19   Nm Gastric Emptying 06/26/2015  Significantly delayed  gastric emptying study. Electronically Signed   By: Rolm Baptise M.D.   On: 06/26/2015 13:31  Dg Chest Portable 1 View 06/21/2015  Stable.  No acute findings. Electronically Signed   By: Misty Stanley M.D.   On: 06/21/2015 18:50   Dg Chest Port 1 View 06/21/2015 No acute cardiopulmonary disease. Electronically Signed   By: Lucrezia Europe M.D.   On: 06/21/2015 12:43    Medical Consultants:  Surgery   Other Consultants:  None   IAnti-Infectives:   None   Faye Ramsay, MD  [facility] Pager [phone]  Time spent in minutes: 25 minutes  If 7PM-7AM, please contact night-coverage [email or web address] Password [facility] 06/27/2015, 5:13 PM   LOS: 6 days    HPI/Subjective: No acute overnight events. Patient reports vomiting earlier in the AM.  Objective: Filed Vitals:   06/26/15 1349 06/26/15 2116 06/27/15 0556 06/27/15 1400  BP: 128/68 122/66 111/60 121/64  Pulse: 92 99 102 98  Temp: 98.5 F (36.9 C) 98.9  F (37.2 C) 98.7 F (37.1 C) 98.2 F (36.8 C)  TempSrc: Oral Oral Oral Oral  Resp:  18 16 18   Height:      Weight:      SpO2: 99% 97% 97% 98%    Intake/Output Summary (Last 24 hours) at 06/27/15 1713 Last data filed at 06/27/15 1400  Gross per 24 hour  Intake   1300 ml  Output    325 ml  Net    975 ml    Exam:   General:  Pt is alert, follows commands appropriately, not in acute  distress  Cardiovascular: Regular rate and rhythm, S1/S2 appreciated   Respiratory: Clear to auscultation bilaterally, no wheezing, no crackles, no rhonchi  Abdomen: Soft, non distended, bowel sounds present  Extremities: No edema, pulses palpable bilaterally  Data Reviewed: Basic Metabolic Panel:  Recent Labs Lab 06/21/15 1239  06/24/15 0900 06/25/15 0648 06/25/15 1350 06/26/15 0441 06/27/15 0448  NA  --   < > 135 136 136 136 138  K  --   < > 3.0* 3.1* 3.0* 2.9* 2.9*  CL  --   < > 104 101 102 99* 100*  CO2  --   < > 24 24 23 27 27   GLUCOSE  --   < > 107* 110* 120* 145* 115*  BUN  --   < > 13 13 12 14 20   CREATININE  --   < > 1.26* 1.19* 1.08* 1.21* 1.34*  CALCIUM  --   < > 7.8* 8.1* 8.0* 8.4* 8.8*  MG 1.7  --   --  1.2*  --  2.0 1.8  PHOS  --   --   --   --   --  2.3* 3.3  < > = values in this interval not displayed. Liver Function Tests:  Recent Labs Lab 06/21/15 0741 06/22/15 0406 06/26/15 0441  AST 16 13* 39  ALT 22 14 42  ALKPHOS 119 87 177*  BILITOT 1.2 0.8 1.2  PROT 7.8 6.0* 6.7  ALBUMIN 3.6 2.5* 2.4*    Recent Labs Lab 06/21/15 0741  LIPASE 85*   CBC:  Recent Labs Lab 06/21/15 0741  06/22/15 0406 06/23/15 0720 06/24/15 0900 06/25/15 0648 06/26/15 0441  WBC 12.7*  < > 8.2 5.6 7.9 9.0 8.1  NEUTROABS 11.1*  --   --   --   --   --  6.4  HGB 9.7*  < > 7.8* 7.8* 7.8* 8.3* 8.0*  HCT 30.3*  < > 23.3* 23.7* 24.1* 25.3* 24.5*  MCV 85.4  < > 85.0 84.9 84.9 83.8 83.3  PLT 522*  < > 352 309 324 354 325  < > = values in this interval not displayed.  CBG:  Recent Labs Lab 06/26/15 1421 06/26/15 1817 06/26/15 2350 06/27/15 0600 06/27/15 1205  GLUCAP 108* 118* 109* 109* 115*    Recent Results (from the past 240 hour(s))  Urine culture     Status: None   Collection Time: 06/21/15  1:16 PM  Result Value Ref Range Status   Specimen Description URINE, CATHETERIZED  Final   Special Requests NONE  Final   Culture   Final    NO GROWTH 1  DAY Performed at [facility]    Report Status 06/22/2015 FINAL  Final  C difficile quick scan w PCR reflex     Status: None   Collection Time: 06/21/15  7:21 PM  Result Value Ref Range Status   C Diff antigen NEGATIVE  NEGATIVE Final   C Diff toxin NEGATIVE NEGATIVE Final   C Diff interpretation Negative for toxigenic C. difficile  Final  Gastrointestinal Panel by PCR , Stool     Status: None   Collection Time: 06/21/15  7:21 PM  Result Value Ref Range Status   Campylobacter species NOT DETECTED NOT DETECTED Final   Plesimonas shigelloides NOT DETECTED NOT DETECTED Final   Salmonella species NOT DETECTED NOT DETECTED Final   Yersinia enterocolitica NOT DETECTED NOT DETECTED Final   Vibrio species NOT DETECTED NOT DETECTED Final   Vibrio cholerae NOT DETECTED NOT DETECTED Final   Enteroaggregative E coli (EAEC) NOT DETECTED NOT DETECTED Final   Enteropathogenic E coli (EPEC) NOT DETECTED NOT DETECTED Final   Enterotoxigenic E coli (ETEC) NOT DETECTED NOT DETECTED Final   Shiga like toxin producing E coli (STEC) NOT DETECTED NOT DETECTED Final   E. coli O157 NOT DETECTED NOT DETECTED Final   Shigella/Enteroinvasive E coli (EIEC) NOT DETECTED NOT DETECTED Final   Cryptosporidium NOT DETECTED NOT DETECTED Final   Cyclospora cayetanensis NOT DETECTED NOT DETECTED Final   Entamoeba histolytica NOT DETECTED NOT DETECTED Final   Giardia lamblia NOT DETECTED NOT DETECTED Final   Adenovirus F40/41 NOT DETECTED NOT DETECTED Final   Astrovirus NOT DETECTED NOT DETECTED Final   Norovirus GI/GII NOT DETECTED NOT DETECTED Final   Rotavirus A NOT DETECTED NOT DETECTED Final   Sapovirus (I, II, IV, and V) NOT DETECTED NOT DETECTED Final     Scheduled Meds: . apixaban  5 mg Oral BID  . aspirin  81 mg Oral QHS  . estradiol  0.05 mg Transdermal Weekly  . ferrous sulfate  325 mg Oral BID WC  . insulin aspart  0-15 Units Subcutaneous 4 times per day  . loperamide  2 mg Oral QHS  .  megestrol  400 mg Oral Daily  . ondansetron  4 mg Oral TID AC & HS  . pantoprazole  80 mg Oral Daily  . potassium chloride  40 mEq Oral Daily  . potassium phosphate IVPB (mmol)  15 mmol Intravenous Once  . protein supplement shake  2 oz Oral QID   Continuous Infusions: . fat emulsion Stopped (06/26/15 1742)  . Marland KitchenTPN (CLINIMIX-E) Adult 40 mL/hr at 06/26/15 1741   And  . fat emulsion 240 mL (06/26/15 1740)  . Marland KitchenTPN (CLINIMIX-E) Adult     And  . fat emulsion

## 2015-06-27 NOTE — Progress Notes (Signed)
Pt vomited approx. 500 cc green bile. Advised pt not to eat or drink for now until her nausea was better.  Abd soft, ostomy secreting loose feces.  Will continue to monitor.

## 2015-06-27 NOTE — Progress Notes (Signed)
PARENTERAL NUTRITION CONSULT NOTE - FOLLOW UP  Pharmacy Consult for TPN Indication: intolerance to tube feeds  No Known Allergies  Patient Measurements: Height: _0  (160 cm) Weight: 202 lb 13.2 oz (92 kg) IBW/kg (Calculated) : 52.4 Adjusted Body Weight: 68kg Usual Weight: 102kg (lost 24h in last month)  Vital Signs: Temp: 98.7 F (37.1 C) (02/26 0556) Temp Source: Oral (02/26 0556) BP: 111/60 mmHg (02/26 0556) Pulse Rate: 102 (02/26 0556) Intake/Output from previous day: 02/25 0701 - 02/26 0700 In: 1375 [P.O.:325; TPN:1050] Out: 625 [Urine:550; Stool:75] Intake/Output from this shift:    Labs:  Recent Labs  06/24/15 0900 06/25/15 0648 06/26/15 0441  WBC 7.9 9.0 8.1  HGB 7.8* 8.3* 8.0*  HCT 24.1* 25.3* 24.5*  PLT 324 354 325     Recent Labs  06/25/15 0648 06/25/15 1350 06/26/15 0441 06/27/15 0448  NA 136 136 136 138  K 3.1* 3.0* 2.9* 2.9*  CL 101 102 99* 100*  CO2 _1 GLUCOSE 110* 120* 145* 115*  BUN _2 CREATININE 1.19* 1.08* 1.21* 1.34*  CALCIUM 8.1* 8.0* 8.4* 8.8*  MG 1.2*  --  2.0 1.8  PHOS  --   --  2.3* 3.3  PROT  --   --  6.7  --   ALBUMIN  --   --  2.4*  --   AST  --   --  39  --   ALT  --   --  42  --   ALKPHOS  --   --  177*  --   BILITOT  --   --  1.2  --   PREALBUMIN  --   --  7.6*  --   TRIG  --   --  119  --    Estimated Creatinine Clearance: 43.9 mL/min (by C-G formula based on Cr of 1.34).    Recent Labs  06/26/15 1817 06/26/15 2350 06/27/15 0600  GLUCAP 118* 109* 109*    Insulin Requirements:  Moderate SSI - 1 unit Novolog  Current Nutrition: on megace Soft diet - premier protein (1 dose charted/day)  IVF: none  Central access: PICC 2/7 TPN start date:  2/24  ASSESSMENT                                                                                                    HPI: [name] s/p laparoscopic and robotic resection of GIST TUMOR lysis of adhesions on 2/3. She was discharged on 2/17, but  returned 2/20 with nausea vomiting, high ileostomy output, poor appetite and subjective fevers. She continues with poor oral intake and nausea/vomiting. Also positive for skin breakdown at ostomy site as a result of malnutrition. No evidence of obstruction per surgery. Pharmacy consulted to start parenteral nutrition.   Significant events:  2/25: gastric emptying study - revealed significantly delayed gastric emptying.  538m green bilious vomitus noted per RN  Today:    Glucose - at goal < 150 mg/dl  Electrolytes - K low at 2.9 (orders for KCl 44m daily), phos improved following  replacement, mag WNL, Corr Ca WNL  Renal - SCr mildly elevated at 1.21  LFTs - Alk phos elevated at baseline (prior to TPN), otherwise WNL  TGs - 119 (2/25)  Prealbumin - 7.6  (2/25)  NUTRITIONAL GOALS                                                                                             RD recs: 2200-2400 kcal, 120-130 gm protein Clinimix 5/15 at a goal rate of 159m/hr + 20% fat emulsion at 114mhr to provide: 126 g/day protein, 2269Kcal/day.  PLAN                                                                                                                         Now:   KCL x 6 runs for low K+, ? If absorbing PO KCl  At 1800 today:  Slowly advance Clinimix E5/15, increase rate to 60 ml/hr tonight  20% fat emulsion at 1043mr.  Plan to advance as tolerated to the goal rate.  TPN to contain standard multivitamins and trace elements.  Continue mod SSI as ordered   TPN lab panels on Mondays & Thursdays.  F/u daily.  DusDoreene ElandharmD, BCPS.   Pager: [phone] 8:30 AM

## 2015-06-28 ENCOUNTER — Inpatient Hospital Stay (HOSPITAL_COMMUNITY): Payer: Commercial Managed Care - HMO

## 2015-06-28 DIAGNOSIS — K30 Functional dyspepsia: Secondary | ICD-10-CM

## 2015-06-28 DIAGNOSIS — I9789 Other postprocedural complications and disorders of the circulatory system, not elsewhere classified: Secondary | ICD-10-CM

## 2015-06-28 DIAGNOSIS — Z932 Ileostomy status: Secondary | ICD-10-CM

## 2015-06-28 DIAGNOSIS — E43 Unspecified severe protein-calorie malnutrition: Secondary | ICD-10-CM

## 2015-06-28 DIAGNOSIS — E669 Obesity, unspecified: Secondary | ICD-10-CM

## 2015-06-28 DIAGNOSIS — I96 Gangrene, not elsewhere classified: Secondary | ICD-10-CM

## 2015-06-28 DIAGNOSIS — R222 Localized swelling, mass and lump, trunk: Secondary | ICD-10-CM

## 2015-06-28 DIAGNOSIS — R198 Other specified symptoms and signs involving the digestive system and abdomen: Secondary | ICD-10-CM

## 2015-06-28 LAB — COMPREHENSIVE METABOLIC PANEL
ALT: 74 U/L — ABNORMAL HIGH (ref 14–54)
ANION GAP: 9 (ref 5–15)
AST: 53 U/L — ABNORMAL HIGH (ref 15–41)
Albumin: 2.3 g/dL — ABNORMAL LOW (ref 3.5–5.0)
Alkaline Phosphatase: 198 U/L — ABNORMAL HIGH (ref 38–126)
BUN: 16 mg/dL (ref 6–20)
CHLORIDE: 103 mmol/L (ref 101–111)
CO2: 25 mmol/L (ref 22–32)
Calcium: 8.8 mg/dL — ABNORMAL LOW (ref 8.9–10.3)
Creatinine, Ser: 1.06 mg/dL — ABNORMAL HIGH (ref 0.44–1.00)
GFR calc non Af Amer: 53 mL/min — ABNORMAL LOW (ref >60)
Glucose, Bld: 116 mg/dL — ABNORMAL HIGH (ref 65–99)
POTASSIUM: 4.1 mmol/L (ref 3.5–5.1)
SODIUM: 137 mmol/L (ref 135–145)
Total Bilirubin: 0.8 mg/dL (ref 0.3–1.2)
Total Protein: 6.8 g/dL (ref 6.5–8.1)

## 2015-06-28 LAB — PHOSPHORUS: Phosphorus: 3.2 mg/dL (ref 2.5–4.6)

## 2015-06-28 LAB — DIFFERENTIAL
BASOS ABS: 0 10*3/uL (ref 0.0–0.1)
Basophils Relative: 0 %
EOS ABS: 0.2 10*3/uL (ref 0.0–0.7)
Eosinophils Relative: 3 %
LYMPHS ABS: 1.1 10*3/uL (ref 0.7–4.0)
LYMPHS PCT: 16 %
MONOS PCT: 10 %
Monocytes Absolute: 0.7 10*3/uL (ref 0.1–1.0)
NEUTROS PCT: 71 %
Neutro Abs: 4.8 10*3/uL (ref 1.7–7.7)

## 2015-06-28 LAB — CBC
HEMATOCRIT: 24.4 % — AB (ref 36.0–46.0)
Hemoglobin: 8 g/dL — ABNORMAL LOW (ref 12.0–15.0)
MCH: 27.6 pg (ref 26.0–34.0)
MCHC: 32.8 g/dL (ref 30.0–36.0)
MCV: 84.1 fL (ref 78.0–100.0)
Platelets: 389 10*3/uL (ref 150–400)
RBC: 2.9 MIL/uL — AB (ref 3.87–5.11)
RDW: 15.8 % — ABNORMAL HIGH (ref 11.5–15.5)
WBC: 6.8 10*3/uL (ref 4.0–10.5)

## 2015-06-28 LAB — GLUCOSE, CAPILLARY
GLUCOSE-CAPILLARY: 104 mg/dL — AB (ref 65–99)
GLUCOSE-CAPILLARY: 109 mg/dL — AB (ref 65–99)
GLUCOSE-CAPILLARY: 132 mg/dL — AB (ref 65–99)

## 2015-06-28 LAB — PREALBUMIN: PREALBUMIN: 9.6 mg/dL — AB (ref 18–38)

## 2015-06-28 LAB — MAGNESIUM: MAGNESIUM: 1.6 mg/dL — AB (ref 1.7–2.4)

## 2015-06-28 LAB — TRIGLYCERIDES: Triglycerides: 180 mg/dL — ABNORMAL HIGH (ref <150)

## 2015-06-28 MED ORDER — MAGNESIUM SULFATE 4 GM/100ML IV SOLN
4.0000 g | Freq: Once | INTRAVENOUS | Status: DC
  Filled 2015-06-28: qty 100

## 2015-06-28 MED ORDER — PANTOPRAZOLE SODIUM 40 MG PO TBEC
80.0000 mg | DELAYED_RELEASE_TABLET | Freq: Two times a day (BID) | ORAL | Status: DC
  Administered 2015-06-28 – 2015-07-06 (×15): 80 mg via ORAL
  Filled 2015-06-28 (×16): qty 2

## 2015-06-28 MED ORDER — TRACE MINERALS CR-CU-MN-SE-ZN 10-1000-500-60 MCG/ML IV SOLN
INTRAVENOUS | Status: AC
  Administered 2015-06-28: 17:00:00 via INTRAVENOUS
  Filled 2015-06-28: qty 1920

## 2015-06-28 MED ORDER — PREMIER PROTEIN SHAKE
8.0000 [oz_av] | Freq: Two times a day (BID) | ORAL | Status: DC
  Administered 2015-06-28 – 2015-07-02 (×3): 8 [oz_av] via ORAL

## 2015-06-28 MED ORDER — MAGNESIUM SULFATE 2 GM/50ML IV SOLN
2.0000 g | Freq: Once | INTRAVENOUS | Status: AC
Start: 2015-06-28 — End: 2015-06-28
  Administered 2015-06-28: 2 g via INTRAVENOUS
  Filled 2015-06-28: qty 50

## 2015-06-28 MED ORDER — PROCHLORPERAZINE MALEATE 10 MG PO TABS: 5.0000 mg | ORAL_TABLET | Freq: Four times a day (QID) | ORAL | Status: DC | PRN

## 2015-06-28 MED ORDER — FAT EMULSION 20 % IV EMUL
240.0000 mL | INTRAVENOUS | Status: AC
  Administered 2015-06-28: 240 mL via INTRAVENOUS
  Filled 2015-06-28: qty 250

## 2015-06-28 MED ORDER — MAGNESIUM SULFATE IN D5W 10-5 MG/ML-% IV SOLN
1.0000 g | Freq: Once | INTRAVENOUS | Status: AC
  Administered 2015-06-28: 1 g via INTRAVENOUS
  Filled 2015-06-28: qty 100

## 2015-06-28 NOTE — Progress Notes (Signed)
Patient ID: Brianna Walker, female   DOB: 05/28/1945, 70 y.o.   MRN: [id number] [facility] PROGRESS NOTE  IZABELLAH STETZEL [id number] DOB: 05-29-1945 DOA: 06/21/2015 PCP: Cathlean Cower, MD  Brief narrative:    70 year old female status post laparoscopic and robotic resection of GIST TUMOR with lysis of adhesions on 2/3, discharged on 2/17. Pt presented to [facility] ED for evaluation of nausea vomiting, high ileostomy output, poor appetite and subjective fevers. General surgery consulted due to recent surgery. CT abdomen pelvis showed postsurgical changes, postsurgical ileus vs partial small bowel obstruction, small hiatal hernia. Chest x-ray was negative. Creatinine has increased from 0.98 to 5.91.   Barrier to discharge: Ongoing nausea and vomiting.  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    Iron deficiency anemia / Anemia of chronic disease  - Likely due to history of GIST tumor - Continue iron supplements   Protein-calorie malnutrition, severe (HCC) - Secondary to acute/chronic related illness  - Has TPN for nutritional support    DVT Prophylaxis  - On full dose anticoagulation with apixaban   Code Status: Full.   Family Communication:  plan of care discussed with the patient Disposition Plan: by 3/1 if she feels better.   IV access:  Peripheral IV  Procedures and diagnostic studies:    Ct Abdomen Pelvis Wo Contrast 06/21/2015 1. Stable postsurgical changes, as described above. 2. Mildly improved postsurgical jejunal ileus or partial obstruction, with transition in the left mid abdomen. This could be an indication of an adhesion. 3. Small hiatal hernia. 4. Colonic diverticulosis. Electronically Signed By: Claudie Revering M.D. On: 06/21/2015 11:19   Nm Gastric Emptying 06/26/2015 Significantly delayed gastric emptying study. Electronically Signed By: Rolm Baptise M.D. On: 06/26/2015 13:31  Dg Chest Portable 1 View 06/21/2015 Stable. No acute findings. Electronically Signed By: Misty Stanley M.D. On: 06/21/2015 18:50   Dg Chest Port 1 View 06/21/2015 No acute cardiopulmonary disease. Electronically Signed By: Lucrezia Europe M.D. On: 06/21/2015 12:43   Medical Consultants:  Surgery  IAnti-Infectives:   None    Leisa Lenz, MD  [facility] Pager [phone]  Time spent in minutes: 25 minutes  If 7PM-7AM, please contact night-coverage [email or web address] Password [facility] 06/28/2015, 12:45 PM   LOS: 7 days    HPI/Subjective: No acute overnight events. Patient reports nausea and vomiting this am.   Objective: Filed Vitals:   06/27/15 0556 06/27/15 1400 06/27/15 2125 06/28/15 0621  BP: 111/60 121/64 110/73 117/72  Pulse: 102 98 106 102  Temp: 98.7 F (37.1 C) 98.2 F (36.8 C) 99.1 F (37.3 C) 98.7 F (37.1 C)  TempSrc: Oral Oral Oral Oral  Resp: 16 18 18 18   Height:      Weight:      SpO2: 97% 98% 99% 99%  Intake/Output Summary (Last 24 hours) at 06/28/15 1245 Last data filed at 06/28/15 1229  Gross per 24 hour  Intake 1761.33 ml  Output   2325 ml  Net -563.67 ml    Exam:   General:  Pt is alert, not in acute distress  Cardiovascular: Regular rate and  rhythm, S1/S2 appreciated   Respiratory: no wheezing, no crackles, no rhonchi  Abdomen: Soft, non tender, non distended, bowel sounds present  Extremities: No edema, pulses palpable bilaterally  Neuro: Grossly nonfocal  Data Reviewed: Basic Metabolic Panel:  Recent Labs Lab 06/25/15 0648 06/25/15 1350 06/26/15 0441 06/27/15 0448 06/28/15 0649  NA 136 136 136 138 137  K 3.1* 3.0* 2.9* 2.9* 4.1  CL 101 102 99* 100* 103  CO2 24 23 27 27 25   GLUCOSE 110* 120* 145* 115* 116*  BUN 13 12 14 20 16   CREATININE 1.19* 1.08* 1.21* 1.34* 1.06*  CALCIUM 8.1* 8.0* 8.4* 8.8* 8.8*  MG 1.2*  --  2.0 1.8 1.6*  PHOS  --   --  2.3* 3.3 3.2   Liver Function Tests:  Recent Labs Lab 06/22/15 0406 06/26/15 0441 06/28/15 0649  AST 13* 39 53*  ALT 14 42 74*  ALKPHOS 87 177* 198*  BILITOT 0.8 1.2 0.8  PROT 6.0* 6.7 6.8  ALBUMIN 2.5* 2.4* 2.3*   No results for input(s): LIPASE, AMYLASE in the last 168 hours. No results for input(s): AMMONIA in the last 168 hours. CBC:  Recent Labs Lab 06/23/15 0720 06/24/15 0900 06/25/15 0648 06/26/15 0441 06/28/15 0649  WBC 5.6 7.9 9.0 8.1 6.8  NEUTROABS  --   --   --  6.4 4.8  HGB 7.8* 7.8* 8.3* 8.0* 8.0*  HCT 23.7* 24.1* 25.3* 24.5* 24.4*  MCV 84.9 84.9 83.8 83.3 84.1  PLT 309 324 354 325 389   Cardiac Enzymes: No results for input(s): CKTOTAL, CKMB, CKMBINDEX, TROPONINI in the last 168 hours. BNP: Invalid input(s): POCBNP CBG:  Recent Labs Lab 06/27/15 1205 06/27/15 1847 06/28/15 06/28/15 0544 06/28/15 1226  GLUCAP 115* 142* 106* 104* 109*    Recent Results (from the past 240 hour(s))  Urine culture     Status: None   Collection Time: 06/21/15  1:16 PM  Result Value Ref Range Status   Specimen Description URINE, CATHETERIZED  Final   Special Requests NONE  Final   Culture   Final    NO GROWTH 1 DAY Performed at [facility]    Report Status 06/22/2015 FINAL  Final  C difficile quick scan w PCR reflex     Status:  None   Collection Time: 06/21/15  7:21 PM  Result Value Ref Range Status   C Diff antigen NEGATIVE NEGATIVE Final   C Diff toxin NEGATIVE NEGATIVE Final   C Diff interpretation Negative for toxigenic C. difficile  Final  Gastrointestinal Panel by PCR , Stool     Status: None   Collection Time: 06/21/15  7:21 PM  Result Value Ref Range Status   Campylobacter species NOT DETECTED NOT DETECTED Final   Plesimonas shigelloides NOT DETECTED NOT DETECTED Final   Salmonella species NOT DETECTED NOT DETECTED Final   Yersinia enterocolitica NOT DETECTED NOT DETECTED Final   Vibrio species NOT DETECTED NOT DETECTED Final   Vibrio cholerae NOT DETECTED NOT DETECTED Final   Enteroaggregative E coli (EAEC) NOT DETECTED NOT DETECTED Final   Enteropathogenic E coli (EPEC) NOT DETECTED NOT DETECTED Final   Enterotoxigenic E coli (ETEC) NOT DETECTED NOT  DETECTED Final   Shiga like toxin producing E coli (STEC) NOT DETECTED NOT DETECTED Final   E. coli O157 NOT DETECTED NOT DETECTED Final   Shigella/Enteroinvasive E coli (EIEC) NOT DETECTED NOT DETECTED Final   Cryptosporidium NOT DETECTED NOT DETECTED Final   Cyclospora cayetanensis NOT DETECTED NOT DETECTED Final   Entamoeba histolytica NOT DETECTED NOT DETECTED Final   Giardia lamblia NOT DETECTED NOT DETECTED Final   Adenovirus F40/41 NOT DETECTED NOT DETECTED Final   Astrovirus NOT DETECTED NOT DETECTED Final   Norovirus GI/GII NOT DETECTED NOT DETECTED Final   Rotavirus A NOT DETECTED NOT DETECTED Final   Sapovirus (I, II, IV, and V) NOT DETECTED NOT DETECTED Final     Scheduled Meds: . apixaban  5 mg Oral BID  . aspirin  81 mg Oral QHS  . estradiol  0.05 mg Transdermal Weekly  . ferrous sulfate  325 mg Oral BID WC  . insulin aspart  0-15 Units Subcutaneous 4 times per day  . lip balm  1 application Topical BID  . loperamide  2 mg Oral QHS  . magnesium sulfate 1 - 4 g bolus IVPB  1 g Intravenous Once  . megestrol  400 mg Oral Daily  .  metoCLOPramide  10 mg Oral TID AC & HS  . ondansetron  4 mg Oral TID AC & HS  . pantoprazole  80 mg Oral Daily  . potassium chloride  40 mEq Oral BID  . sodium chloride  2,000 mL Intravenous Once  . sodium chloride flush  10-40 mL Intracatheter Q12H   Continuous Infusions: . Marland KitchenTPN (CLINIMIX-E) Adult     And  . fat emulsion    . Marland KitchenTPN (CLINIMIX-E) Adult 60 mL/hr at 06/27/15 1719   And  . fat emulsion 240 mL (06/27/15 1800)

## 2015-06-28 NOTE — Progress Notes (Signed)
PARENTERAL NUTRITION CONSULT NOTE - FOLLOW UP  Pharmacy Consult for TPN Indication: intolerance to tube feeds  No Known Allergies  Patient Measurements: Height: _0  (160 cm) Weight: 202 lb 13.2 oz (92 kg) IBW/kg (Calculated) : 52.4 Adjusted Body Weight: 68kg Usual Weight: 102kg (lost 24h in last month)  Vital Signs: Temp: 98.7 F (37.1 C) (02/27 0621) Temp Source: Oral (02/27 0621) BP: 117/72 mmHg (02/27 0621) Pulse Rate: 102 (02/27 0621) Intake/Output from previous day: 02/26 0701 - 02/27 0700 In: 1991.3 [P.O.:120; IV Piggyback:500; TPN:1371.3] Out: 2175 [Urine:1200; Emesis/NG output:550; Stool:425] Intake/Output from this shift:    Labs:  Recent Labs  06/26/15 0441 06/28/15 0649  WBC 8.1 6.8  HGB 8.0* 8.0*  HCT 24.5* 24.4*  PLT 325 389     Recent Labs  06/26/15 0441 06/27/15 0448 06/28/15 0649  NA 136 138 137  K 2.9* 2.9* 4.1  CL 99* 100* 103  CO2 _1 GLUCOSE 145* 115* 116*  BUN _2 CREATININE 1.21* 1.34* 1.06*  CALCIUM 8.4* 8.8* 8.8*  MG 2.0 1.8 1.6*  PHOS 2.3* 3.3 3.2  PROT 6.7  --  6.8  ALBUMIN 2.4*  --  2.3*  AST 39  --  53*  ALT 42  --  74*  ALKPHOS 177*  --  198*  BILITOT 1.2  --  0.8  PREALBUMIN 7.6*  --   --   TRIG 119  --   --    Estimated Creatinine Clearance: 55.4 mL/min (by C-G formula based on Cr of 1.06).    Recent Labs  06/27/15 1847 06/28/15 06/28/15 0544  GLUCAP 142* 106* 104*    Insulin Requirements:  Moderate SSI - 3 units Novolog  Current Nutrition: on megace Dysphagia 1 diet, no meal intake documented  IVF: none  Central access: PICC 2/7 TPN start date:  2/24  ASSESSMENT                                                                                                    HPI: [name] s/p laparoscopic and robotic resection of GIST TUMOR lysis of adhesions on 2/3. She was discharged on 2/17, but returned 2/20 with nausea vomiting, high ileostomy output, poor appetite and subjective fevers. She  continues with poor oral intake and nausea/vomiting. Also positive for skin breakdown at ostomy site as a result of malnutrition. No evidence of obstruction per surgery. Pharmacy consulted to start parenteral nutrition.   Significant events:  2/25: gastric emptying study - revealed significantly delayed gastric emptying.  559m green bilious vomitus noted per RN. On Reglan.  Today:    Glucose - at goal < 150 mg/dl  Electrolytes - K improved to 4.1 after 6 runs of KCl.  Pt has orders for KCl 421m daily but K+ wasn't improving, question if absorbing PO KCl. Phos WNL, Mag 1.6 - will need replacement today, Corr Ca WNL  Renal - SCr 1.06, has been slightly up d/t vomiting episodes  LFTs - Alk phos elevated, AST/ALT increased - follow trend  TGs - 119 (2/25)  Prealbumin -  7.6  (2/25)  NUTRITIONAL GOALS                                                                                             RD recs: 2200-2400 kcal, 120-130 gm protein Clinimix 5/15 at a goal rate of 159m/hr + 20% fat emulsion at 167mhr to provide: 126 g/day protein, 2269Kcal/day.  PLAN                                                                                                                         Now:   Magnesium sulfate 1gm IV x 1.  At 1800 today:  Slowly advance Clinimix E 5/15, increase rate to 80 ml/hr tonight  20% fat emulsion at 10103mr.  Plan to advance as tolerated to the goal rate.  TPN to contain standard multivitamins and trace elements.  Continue mod SSI as ordered q6h  TPN lab panels on Mondays & Thursdays.  CMET, Phos, Mag in AM.  F/u daily.  AmaHershal CoriaharmD, BCPS Pager: [phone]/[phone] 8:52 AM

## 2015-06-28 NOTE — Progress Notes (Signed)
Nutrition Follow-up  DOCUMENTATION CODES:   Severe malnutrition in context of acute illness/injury, Obesity unspecified  INTERVENTION:   Monitor magnesium, potassium, and phosphorus daily for at least 3 days, MD to replete as needed, as pt is at risk for refeeding syndrome given severe malnutrition status and poor PO intake.  -TPN per Pharmacy -Provide Premier Protein shake BID, each provides 160 kcal and 30g protein -Encourage PO intake  -RD to continue to monitor  NUTRITION DIAGNOSIS:   Inadequate oral intake related to poor appetite as evidenced by per patient/family report, meal completion < 25%.  Ongoing.  GOAL:   Patient will meet greater than or equal to 90% of their needs  Progressing with TPN.  MONITOR:   PO intake, Supplement acceptance, Labs, Weight trends, Skin, I & O's, Other (Comment) (TPN)  ASSESSMENT:   70 year old female status post laparoscopic and robotic resection of GIST TUMOR lysis of adhesions on 2/3, discharged on 2/17, presents to the ER today with acute renal failure, dehydration, nausea vomiting, high ileostomy output, poor appetite and subjective fevers. General surgery consulted due to recent surgery. CT abdomen pelvis shows postsurgical changes, postsurgical ileus vs partial small bowel obstruction, small hiatal hernia.  Patient reports having a vomiting episode last night. She continued to take in cran-grape juice and a Premier Protein shake and it ended up coming back up. Pt's diet has been advanced to pureed diet. Pt did not want to try any pureed foods at this time. RD to order Premier Protein but encouraged pt to sip the supplement slowly.   Medications: ferrous sulfate tablet BID, Megace daily, Reglan tablet QID, Zofran tablet QID, K-DUR tablet BID  Labs reviewed: CBGs: 104-109 Low Mg Phos/K WNL  Plan per Pharmacy 2/27: Now:   Magnesium sulfate 1gm IV x 1. At 1800 today:  Slowly advance Clinimix E 5/15, increase rate to 80 ml/hr  tonight  20% fat emulsion at 60ml/hr.  Plan to advance as tolerated to the goal rate.  Diet Order:  Diet - low sodium heart healthy TPN (CLINIMIX-E) Adult DIET - DYS 1 Room service appropriate?: Yes; Fluid consistency:: Thin .TPN (CLINIMIX-E) Adult  Skin:  Wound (see comment) (skin breakdown around ostomy)  Last BM:  2/27  Height:   Ht Readings from Last 1 Encounters:  06/22/15 5\' 3"  (1.6 m)    Weight:   Wt Readings from Last 1 Encounters:  06/22/15 202 lb 13.2 oz (92 kg)    Ideal Body Weight:  52.27 kg (kg)  BMI:  Body mass index is 35.94 kg/(m^2).  Estimated Nutritional Needs:   Kcal:  2200-2400  Protein:  120-130 grams  Fluid:  >/= 2.8 L/day  EDUCATION NEEDS:   Education needs addressed  Clayton Bibles, MS, RD, LDN Pager: [phone] After Hours Pager: [phone]

## 2015-06-28 NOTE — Consult Note (Signed)
WOC ostomy follow up: Seen with Dr. Johney Maine today for inspection of parastomal wound/MC separation(s).  Writer assisted Dr. Johney Maine in proximal limb exploration with an NGT. Dr. Johney Maine surgically excised a 0.5cm x 3.5cm skin bridge today across the medial defect. Stoma type/location: RLQ ileostomy, loop.  Proximal limb at 11 o'clock. Distal limb at 6 o'clock. Stomal assessment/size: 1 and 1/8 inch oblong Peristomal assessment: MC separation at medial and lateral stoma; medial >lateral.  Medial is from 12-6 o'clock and the depth is approximately 3cm.  Lateral is from 8-9 o'clock and is 0.5cm deep Treatment options for stomal/peristomal skin: Defect is filled with pectin powder, covered with pectin based strips and pouched. Output: thin, dark brown/green effluent.  Ostomy pouching: 1pc.convex pouching system with defect filled with pectin powder prior to placing "strips" of pectin over the defect.  A skin barrier ring is used to encircle the loop ileostomy and then the pouch is applied. Education provided: Patient is supported throughout procedure and is taught that wounds will gradually fill in.  She understands the medical complexity of her care and appreciates Dr. Johney Maine' explanations today. Enrolled patient in [place] Start Discharge program: Yes, previously [facility] nursing team will follow, and will remain available to this patient, the nursing and medical teams.  Supplies in room. Thanks, Maudie Flakes, MSN, RN, [place], Arther Abbott  Pager# [phone]

## 2015-06-28 NOTE — Progress Notes (Signed)
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 Resection of cystic mass near small bowel with serosal repair.  Surgeon(s): Michael Boston, MD Leighton Ruff, MD - Assist   Assessment  RECURRENT N/V with delayed gastric emptying   Plan:  -DGE/gastroparesis.  Zofran RTC.  Better on Reglan.  GI consult if not better  -mild ileostomy stenosis but no stricture with good output & 18Fr NGT easily intubates.  ARF/dehydration - nearly resolved but poor PO intake.  Check orthostatic VS    -Poor PO with intermittent n/v & no evidence of obstruction.  Scheduled Zofran.  Try Megace to stimulate appetite.  TNA until eating better  -Skin separation at pink ileostomy with skin necrosis - ostomy care.  Reflection of malnutrition. OSTOMY CARE / TRAINING.  Complex packing/pouching working so far.  Large wound but granulating. >3 weeks postop = would treat as EC fistula & pouch & try to get nutrition better.  I worry attempt at ostomy revision would be even worse.  Can try to take ileostomy down if > 6 weeks & Albumin >3 to minimize risks of leak/breakdown  -replace lytes PRN  -antidiarrheal regimen as tolerated once tolerating PO in order to avoid dehydration.  Challenging with  her nausea issues.  Iron BID.    -VTE prophylaxis- SCDs, etc  -GERD - inc PPI  -hyperlipidemia - Tx  -HTN  - No ACE Inh  -h/o PE - Eliquis as tolerated.    -mobilize as tolerated to help recovery.  GET HER UP!  -Pathology - ?+margin most likely fracture of GIST exposed.  Most likely will get gleevec post-adjuvant for 3 years total per Dr Benay Spice.   D/C patient from hospital when patient meets criteria (anticipate in ???? day(s)):  Tolerating oral intake well Controlling diarrhea Ambulating well Adequate pain control without IV medications Urinating  Having flatus Disposition planning in place - Probably SNF w ostomy care.  Increase HH IVF via PICC qMWF to 2L (instead of 1L qMWF) Ostomy care/teaching   Adin [name], M.D., F.A.C.S. Gastrointestinal and Minimally Invasive Surgery [facility] [address] [place], [place] [address] [phone] Main / Paging   06/28/2015  Subjective:  Nausea much less on Reglan - so far Stays in bed.  Claims to be walking OK No anorectal pain No pain at ileostomy  Appetite fair - "nothing takes good"   Objective:  Vital signs:  Filed Vitals:   06/27/15 0556 06/27/15 1400 06/27/15 2125 06/28/15 0621  BP: 111/60 121/64 110/73 117/72  Pulse: 102 98 106 102  Temp: 98.7 F (37.1 C) 98.2 F (36.8 C) 99.1 F (37.3 C) 98.7 F (37.1 C)  TempSrc: Oral Oral Oral Oral  Resp: '16 18 18 18  ' Height:      Weight:      SpO2: 97% 98% 99% 99%  Last BM Date: 06/28/15  Intake/Output   Yesterday:  02/26 0701 - 02/27 0700 In: 1991.3 [P.O.:120; IV Piggyback:500; TPN:1371.3] Out: 2175 [Urine:1200; Emesis/NG output:550; Stool:425] This shift:  Total I/O In: -  Out: 150 [Stool:150]  Bowel function:  Flatus: YES  BM: thin effluent in bag.   Drain: n/a  Physical Exam:  General: Pt awake/alert/oriented x4 in no acute distress.  Smiling, alert Eyes: PERRL, normal EOM.  Sclera clear.  No  icterus Neuro: CN II-XII intact w/o focal sensory/motor deficits. Lymph: No head/neck/groin lymphadenopathy Psych:  No delerium/psychosis/paranoia HENT: Normocephalic, Mucus membranes moist.  No thrush Neck: Supple, No tracheal deviation Chest: No chest wall pain w good excursion CV:  Pulses intact.  Regular rhythm MS: Normal AROM mjr joints.  No obvious deformity  Abdomen: Soft but morbidly obese.  Nondistended.  Nontender.  No fluctuance/cellultis.  No guarding.  No evidence of peritonitis.  No incarcerated hernias.  Loop ileostomy pink with mild stenosis - NGT intubates easily into superior stoma of the loop (Need 45 deg cephalad angle) - moderate thin effluent out superior orifice.  Medial skin separation & skin necrosis - I excised dead skin with WOCN present.  Wound moderate & deep but good granulation & no necrosis.  Bag holding q3-4 days w/o leak (!).  No purulence/cellulitis.  No ostomy necrosis.    Ext:  SCDs BLE.  No mjr edema.  No cyanosis Skin: No petechiae / purpura  Results:   Diagnosis 1. Soft tissue, biopsy, abdominal wall cyst ? fistula BENIGN CYST AND REACTIVE CHANGES NEGATIVE FOR MALIGNANCY 2. Soft tissue mass, simple excision, Pelvic GIST GASTROINTESTINAL STROMAL TUMORS OF THE RECTUM SHOWING TREATMENT EFFECT (9.5 CM) MARGIN OF RESECTION IS POSITIVE 3. Colon, segmental resection for tumor, rectum DIVERTICULA NO RESIDUAL GASTROINTESTINAL STROMAL TUMOR IDENTIFIED MARGINS OF RESECTION ARE NEGATIVE FIFTEEN BENIGN LYMPH NODES (0/15) Microscopic Comment 2. GASTROINTESTINAL STROMAL TUMOR (GIST): Procedure: segmental resection Tumor Site: rectum Specify: pelvis rectum Tumor Size: 9.5 cm Tumor Focality Unifocal: focal GIST Subtype (spindle, epithelioid, mixed, etc.): spindle Mitotic Rate: 1 /50 HIGH POWER FIELD Necrosis: negative Histologic Grade 1 G1: Low grade; mitotic rate 5/50 HIGH POWER FIELD. G2: High grade; mitotic rate >5/50 HIGH POWER FIELD. Risk  Assessment: _ Margins: positive Ancillary testing: Per request 1 of 3 FINAL for Brianna Walker, Brianna Walker [phone]) Microscopic Comment(continued) Lymph nodes: number examined: 15; number positive: 0 Pathologic Staging: pT3 pN0 Comments: The tumor shows treatment effect with hypocellular, atrophic spindle cells and hyalinized, fibrotic stroma. less than 1% of the tumor without treatment effect. [name] MD Pathologist, Electronic Signature (Case signed 06/10/2015) Specimen Brianna Walker and Clinical Information Specimen(s) Obtained: 1. Soft tissue, biopsy, abdominal wall cyst ? fistula 2. Soft tissue mass, simple excision, Pelvic GIST 3. Colon, segmental resection for tumor, rectum Specimen Clinical Information 1. GIST tumor of rectum in pelvis (kp) Brianna Walker 1. In formalin is a a 3.1 x 1.4 x 0.3 cm irregular portion of fatty tissue, which on sectioning contains a 0.5 cm collapsed smooth cystic space. Sectioned and entirely submitted in one block. 2. Received in formalin is a 176 gram, 9.5 x 6.8 x 5.8 cm rubbery to firm mass, which has an attached suture over a 2.1 x 1.5 cm roughened area, clinically identifying anterior distal. This area is inked orange. There is also a 5.0 x 4.2 cm disrupted area, which is clinically identified as disrupted margin. This area is inked black. Also along one aspect is a 3.4 x 3.3 cm area of tan to hyperemic possible  smooth mucosa. The cut surfaces of the mass are tan to tan yellow, with vaguely whorled cut surfaces, and scattered myxoid areas. Representative sections are submitted as follows: A, B = sections from clinically disrupted margin C = sections from area identified by suture as anterior distal D, E = central sections Total: 5 blocks submitted 3. Received in formalin is a 27 cm in length segment of colon, clinically recto-sigmoid, which includes at least probable middle third of rectum. Surrounding mesorectum is incomplete and focally disrupted. There is an  attached suture over the anterior perirectal surface. The mucosa is tan pink, smooth, soft with normal intestinal folds. There are no mucosal lesions identified. Within the sigmoid portion are several unperforated diverticula. Found within the surrounding fat are 15 possible lymph nodes ranging from 0.1 to 0.6 cm. Block Summary: A = proximal margin B, C = distal margin D, E = sections taken at attached suture over anterior mesorectum F = diverticulum G = four nodes, whole H = four nodes, whole I = four nodes, whole J = three nodes, whole Total: 10 blocks submitted (SW:ds 06/07/15) 2 of 3 FINAL for Brianna Walker, Brianna Walker ([id number]) Report signed out from the following location(s) Technical component and interpretation was performed at [facility], [place], [place] [address]. CLIA #: [id number], 3 of 3   Labs: Results for orders placed or performed during the hospital encounter of 06/21/15 (from the past 48 hour(s))  Glucose, capillary     Status: Abnormal   Collection Time: 06/26/15  6:17 PM  Result Value Ref Range   Glucose-Capillary 118 (H) 65 - 99 mg/dL  Glucose, capillary     Status: Abnormal   Collection Time: 06/26/15 11:50 PM  Result Value Ref Range   Glucose-Capillary 109 (H) 65 - 99 mg/dL  Magnesium     Status: None   Collection Time: 06/27/15  4:48 AM  Result Value Ref Range   Magnesium 1.8 1.7 - 2.4 mg/dL  Basic metabolic panel     Status: Abnormal   Collection Time: 06/27/15  4:48 AM  Result Value Ref Range   Sodium 138 135 - 145 mmol/L   Potassium 2.9 (L) 3.5 - 5.1 mmol/L   Chloride 100 (L) 101 - 111 mmol/L   CO2 27 22 - 32 mmol/L   Glucose, Bld 115 (H) 65 - 99 mg/dL   BUN 20 6 - 20 mg/dL   Creatinine, Ser 1.34 (H) 0.44 - 1.00 mg/dL   Calcium 8.8 (L) 8.9 - 10.3 mg/dL   GFR calc non Af Amer 40 (L) >60 mL/min   GFR calc Af Amer 46 (L) >60 mL/min    Comment: (NOTE) The eGFR has been calculated using the CKD EPI equation. This  calculation has not been validated in all clinical situations. eGFR's persistently <60 mL/min signify possible Chronic Kidney Disease.    Anion gap 11 5 - 15  Phosphorus     Status: None   Collection Time: 06/27/15  4:48 AM  Result Value Ref Range   Phosphorus 3.3 2.5 - 4.6 mg/dL  Glucose, capillary     Status: Abnormal   Collection Time: 06/27/15  6:00 AM  Result Value Ref Range   Glucose-Capillary 109 (H) 65 - 99 mg/dL  Glucose, capillary     Status: Abnormal   Collection Time: 06/27/15 12:05 PM  Result Value Ref Range   Glucose-Capillary 115 (H) 65 - 99 mg/dL  Glucose, capillary     Status: Abnormal   Collection  Time: 06/27/15  6:47 PM  Result Value Ref Range   Glucose-Capillary 142 (H) 65 - 99 mg/dL  Glucose, capillary     Status: Abnormal   Collection Time: 06/28/15 12:00 AM  Result Value Ref Range   Glucose-Capillary 106 (H) 65 - 99 mg/dL  Glucose, capillary     Status: Abnormal   Collection Time: 06/28/15  5:44 AM  Result Value Ref Range   Glucose-Capillary 104 (H) 65 - 99 mg/dL  Magnesium     Status: Abnormal   Collection Time: 06/28/15  6:49 AM  Result Value Ref Range   Magnesium 1.6 (L) 1.7 - 2.4 mg/dL  Comprehensive metabolic panel     Status: Abnormal   Collection Time: 06/28/15  6:49 AM  Result Value Ref Range   Sodium 137 135 - 145 mmol/L   Potassium 4.1 3.5 - 5.1 mmol/L    Comment: REPEATED TO VERIFY DELTA CHECK NOTED NO VISIBLE HEMOLYSIS    Chloride 103 101 - 111 mmol/L   CO2 25 22 - 32 mmol/L   Glucose, Bld 116 (H) 65 - 99 mg/dL   BUN 16 6 - 20 mg/dL   Creatinine, Ser 1.06 (H) 0.44 - 1.00 mg/dL   Calcium 8.8 (L) 8.9 - 10.3 mg/dL   Total Protein 6.8 6.5 - 8.1 g/dL   Albumin 2.3 (L) 3.5 - 5.0 g/dL   AST 53 (H) 15 - 41 U/L   ALT 74 (H) 14 - 54 U/L   Alkaline Phosphatase 198 (H) 38 - 126 U/L   Total Bilirubin 0.8 0.3 - 1.2 mg/dL   GFR calc non Af Amer 53 (L) >60 mL/min   GFR calc Af Amer >60 >60 mL/min    Comment: (NOTE) The eGFR has been  calculated using the CKD EPI equation. This calculation has not been validated in all clinical situations. eGFR's persistently <60 mL/min signify possible Chronic Kidney Disease.    Anion gap 9 5 - 15  Phosphorus     Status: None   Collection Time: 06/28/15  6:49 AM  Result Value Ref Range   Phosphorus 3.2 2.5 - 4.6 mg/dL  CBC     Status: Abnormal   Collection Time: 06/28/15  6:49 AM  Result Value Ref Range   WBC 6.8 4.0 - 10.5 K/uL   RBC 2.90 (L) 3.87 - 5.11 MIL/uL   Hemoglobin 8.0 (L) 12.0 - 15.0 g/dL   HCT 24.4 (L) 36.0 - 46.0 %   MCV 84.1 78.0 - 100.0 fL   MCH 27.6 26.0 - 34.0 pg   MCHC 32.8 30.0 - 36.0 g/dL   RDW 15.8 (H) 11.5 - 15.5 %   Platelets 389 150 - 400 K/uL  Differential     Status: None   Collection Time: 06/28/15  6:49 AM  Result Value Ref Range   Neutrophils Relative % 71 %   Neutro Abs 4.8 1.7 - 7.7 K/uL   Lymphocytes Relative 16 %   Lymphs Abs 1.1 0.7 - 4.0 K/uL   Monocytes Relative 10 %   Monocytes Absolute 0.7 0.1 - 1.0 K/uL   Eosinophils Relative 3 %   Eosinophils Absolute 0.2 0.0 - 0.7 K/uL   Basophils Relative 0 %   Basophils Absolute 0.0 0.0 - 0.1 K/uL  Triglycerides     Status: Abnormal   Collection Time: 06/28/15  6:49 AM  Result Value Ref Range   Triglycerides 180 (H) <150 mg/dL    Comment: Performed at [facility]  Prealbumin  Status: Abnormal   Collection Time: 06/28/15  6:49 AM  Result Value Ref Range   Prealbumin 9.6 (L) 18 - 38 mg/dL    Comment: Performed at [facility]  Glucose, capillary     Status: Abnormal   Collection Time: 06/28/15 12:26 PM  Result Value Ref Range   Glucose-Capillary 109 (H) 65 - 99 mg/dL      Imaging / Studies: No results found.  Medications / Allergies: per chart  Antibiotics: Anti-infectives    None        Note: Portions of this report may have been transcribed using voice recognition software. Every effort was made to ensure accuracy; however, inadvertent computerized  transcription errors may be present.   Any transcriptional errors that result from this process are unintentional.     Adin [name], M.D., F.A.C.S. Gastrointestinal and Minimally Invasive Surgery [facility] [address], [place], [place] [address] [phone] Main / Paging   06/28/2015  CARE TEAM:  PCP: Cathlean Cower, MD  Outpatient Care Team: Patient Care Team: Biagio Borg, MD as PCP - General Michael Boston, MD as Consulting Physician (General Surgery) Ladell Pier, MD as Consulting Physician (Oncology) Milus Banister, MD as Consulting Physician (Gastroenterology) Billy Fischer, MD as Consulting Physician (Family Medicine)  Inpatient Treatment Team: Treatment Team: Attending Provider: Robbie Lis, MD; Consulting Physician: Michael Boston, MD; Rounding Team: Ian Bushman, MD; Technician: Guadalupe Maple, NT; Technician: Sueanne Margarita, NT; Technician: Abbe Amsterdam, NT; Registered Nurse: Arnold Long, RN; Technician: Parks Ranger, NT; Registered Nurse: Bailey Mech, RN; Registered Nurse: Vicente Serene, RN; Consulting Physician: Nolon Nations, MD

## 2015-06-28 NOTE — Clinical Social Work Placement (Signed)
CSW spoke with patient & son at bedside re: discharge planning. Patient is agreeable with plan for SNF. CSW sent information out to [facility] - unsure whether patient will be discharged with TPN. CSW explained to patient that there are only 3 facilities that take TPN ([facility], [facility]). Patient expressed interest in [facility] as it is close to their home. CSW will follow-up in the morning re: TPN & discharge.     Raynaldo Opitz, [facility] Clinical Social Worker cell #: [phone]    CLINICAL SOCIAL WORK PLACEMENT  NOTE  Date:  06/28/2015  Patient Details  Name: Brianna Walker MRN: [id number] Date of Birth: 1945-08-20  Clinical Social Work is seeking post-discharge placement for this patient at the [facility] level of care (*CSW will initial, date and re-position this form in  chart as items are completed):  Yes   Patient/family provided with [facility] Work Department's list of facilities offering this level of care within the geographic area requested by the patient (or if unable, by the patient's family).  Yes   Patient/family informed of their freedom to choose among providers that offer the needed level of care, that participate in Medicare, Medicaid or managed care program needed by the patient, have an available bed and are willing to accept the patient.  Yes   Patient/family informed of [facility]'s ownership interest in [facility] and [facility], as well as of the fact that they are under no obligation to receive care at these facilities.  PASRR submitted to EDS on 06/28/15     PASRR number received on 06/28/15     Existing PASRR number confirmed on       FL2 transmitted to all facilities in geographic area requested by pt/family on 06/28/15     FL2 transmitted to all facilities within larger geographic area on       Patient informed that his/her managed care  company has contracts with or will negotiate with certain facilities, including the following:            Patient/family informed of bed offers received.  Patient chooses bed at       Physician recommends and patient chooses bed at      Patient to be transferred to   on  .  Patient to be transferred to facility by       Patient family notified on   of transfer.  Name of family member notified:        PHYSICIAN       Additional Comment:    _______________________________________________ Standley Brooking, LCSW 06/28/2015, 12:30 PM

## 2015-06-28 NOTE — Progress Notes (Signed)
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     PT Comments    Pt  Not willing to amb at time of PT visit; she continues to need assist with bed mobility and transfers so will continue to follow; encouraged pt to do ex's and amb with nursing staff later today  Follow Up Recommendations  Supervision - Intermittent;Home health PT     Equipment Recommendations   (pt has 4wheeled walker)    Recommendations for Other Services       Precautions / Restrictions Precautions Precautions: Fall Precaution Comments: ileostomy Restrictions Weight Bearing Restrictions: No    Mobility  Bed Mobility Overal bed mobility: Needs Assistance Bed Mobility: Supine to Sit     Supine to sit: Min assist     General bed mobility comments: increased time, HOB elevated ~20*, cues to self assist  Transfers Overall transfer level: Needs assistance Equipment used: None Transfers: Sit to/from [facility] Sit to Stand: Min guard Stand pivot transfers: Min assist;From elevated surface       General transfer comment: verbal cues for hand placement  Ambulation/Gait Ambulation/Gait assistance:  (pt declines amb)               Stairs            Wheelchair Mobility    Modified Rankin (Stroke Patients Only)       Balance                                    Cognition Arousal/Alertness: Awake/alert Behavior During Therapy: WFL for tasks assessed/performed Overall Cognitive Status: Within Functional Limits for tasks assessed                       Exercises      General Comments        Pertinent Vitals/Pain Pain Assessment: 0-10 Pain Score: 3  Faces Pain Scale: Hurts a little bit Pain Location: L side Pain Descriptors / Indicators: Jabbing Pain Intervention(s): Limited activity within patient's tolerance;Monitored during session    Home Living                      Prior Function            PT Goals (current goals can now be found in the care plan section) Acute Rehab PT Goals Patient Stated Goal: home PT Goal Formulation: With patient Time For Goal Achievement: 07/06/15 Potential to Achieve Goals: Good Progress towards PT goals: Progressing toward goals    Frequency  Min 3X/week    PT Plan Current plan remains appropriate    Co-evaluation             End of Session   Activity Tolerance: Patient tolerated treatment well Patient left: in chair;with call bell/phone within reach;with family/visitor present     Time: 1355-1412 PT Time Calculation (min) (ACUTE ONLY): 17 min  Charges:  $Therapeutic Activity: 8-22 mins                    G Codes:  [place] 06/28/2015, 2:10 PM

## 2015-06-28 NOTE — Progress Notes (Signed)
Spoke with patient at bedside, discussed d/c plans, patient still interested in SNF at d/c. SNF appropriate d/t medical complexity. Contacted CSW to f/u with patient.

## 2015-06-28 NOTE — NC FL2 (Signed)
[place] MEDICAID FL2 LEVEL OF CARE SCREENING TOOL     IDENTIFICATION  Patient Name: Brianna Walker Birthdate: [birth year] Sex: female Admission Date (Current Location): 06/21/2015  [facility] and [place] Number:  Herbalist and Address:  [facility],  [place] [address], [place]      Provider Number: [id number]  Attending Physician Name and Address:  Robbie Lis, MD  Relative Name and Phone Number:       Current Level of Care: Hospital Recommended Level of Care: [place] Prior Approval Number:    Date Approved/Denied:   PASRR Number: [id number]  Discharge Plan: SNF    Current Diagnoses: Patient Active Problem List   Diagnosis Date Noted  . Dehydration   . Protein-calorie malnutrition, severe ([place]) 06/23/2015  . Hypokalemia 06/23/2015  . AKI (acute kidney injury) ([place]) 06/21/2015  . 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 Iron deficiency anemia 12/07/2011  . Hyperlipidemia 09/12/2007    Orientation RESPIRATION BLADDER Height & Weight     Self, Time, Situation, Place  Normal Continent Weight: 202 lb 13.2 oz (92 kg) Height:  5\' 3"  (160 cm)  BEHAVIORAL SYMPTOMS/MOOD NEUROLOGICAL BOWEL NUTRITION STATUS      Continent Diet, TNA (Dysphasia 1 Diet)  AMBULATORY STATUS COMMUNICATION OF NEEDS Skin   Limited Assist Verbally  (IleostomyLoopRUQ)                       Personal Care Assistance Level of Assistance  Bathing, Dressing Bathing Assistance: Limited assistance   Dressing Assistance: Limited assistance     Functional Limitations Info             SPECIAL CARE FACTORS FREQUENCY  PT (By licensed PT)     PT Frequency: 5              Contractures      Additional Factors Info  Code Status, Allergies Code Status Info: Fullcode Allergies Info: NKDA           Current Medications (06/28/2015):  This is the current hospital active  medication list Current Facility-Administered Medications  Medication Dose Route Frequency Provider Last Rate Last Dose  . Marland KitchenTPN (CLINIMIX-E) Adult   Intravenous Continuous TPN Dara Hoyer, RPH       And  . fat emulsion 20 % infusion 240 mL  240 mL Intravenous Continuous TPN Dara Hoyer, RPH      . alum & mag hydroxide-simeth (MAALOX/MYLANTA) 200-200-20 MG/5ML suspension 30 mL  30 mL Oral Q6H PRN Michael Boston, MD   30 mL at 06/25/15 2131  . apixaban (ELIQUIS) tablet 5 mg  5 mg Oral BID Theodis Blaze, MD   5 mg at 06/28/15 1037  . aspirin chewable tablet 81 mg  81 mg Oral QHS Michael Boston, MD   81 mg at 06/27/15 2200  . diphenhydrAMINE (BENADRYL) injection 12.5-25 mg  12.5-25 mg Intravenous Q6H PRN Michael Boston, MD      . estradiol [facility] - Dosed in mg/24 hr) patch 0.05 mg  0.05 mg Transdermal Weekly Michael Boston, MD   0.05 mg at 06/25/15 1229  . TPN (CLINIMIX-E) Adult   Intravenous Continuous TPN Berton Mount, RPH 60 mL/hr at 06/27/15 1719     And  . fat emulsion 20 % infusion 240 mL  240 mL Intravenous Continuous TPN Berton Mount, RPH 10 mL/hr at 06/27/15 1800 240 mL at 06/27/15 1800  .  ferrous sulfate tablet 325 mg  325 mg Oral BID WC Michael Boston, MD   325 mg at 06/28/15 [id number]  . HYDROmorphone (DILAUDID) injection 0.5-1 mg  0.5-1 mg Intravenous Q4H PRN Theodis Blaze, MD   0.5 mg at 06/27/15 2321  . insulin aspart (novoLOG) injection 0-15 Units  0-15 Units Subcutaneous 4 times per day Thomes Lolling, [facility]   3 Units at 06/27/15 1859  . levalbuterol (XOPENEX) nebulizer solution 0.63 mg  0.63 mg Nebulization Q6H PRN Reyne Dumas, MD      . lip balm (CARMEX) ointment 1 application  1 application Topical BID Michael Boston, MD   1 application at [id number]  . loperamide (IMODIUM) capsule 2 mg  2 mg Oral QHS Michael Boston, MD   2 mg at 06/27/15 2200  . loperamide (IMODIUM) capsule 2-4 mg  2-4 mg Oral Q8H PRN Michael Boston, MD      . LORazepam (ATIVAN) injection 0.5-1 mg  0.5-1 mg  Intravenous Q8H PRN Michael Boston, MD      . magic mouthwash  15 mL Oral QID PRN Michael Boston, MD      . magnesium sulfate IVPB 1 g 100 mL  1 g Intravenous Once Dara Hoyer, RPH      . megestrol (MEGACE) 40 MG/ML suspension 400 mg  400 mg Oral Daily Michael Boston, MD   400 mg at 06/28/15 1038  . menthol-cetylpyridinium (CEPACOL) lozenge 3 mg  1 lozenge Oral PRN Michael Boston, MD      . metoCLOPramide (REGLAN) tablet 10 mg  10 mg Oral TID AC & HS Michael Boston, MD   10 mg at 06/28/15 0827  . ondansetron (ZOFRAN) injection 4 mg  4 mg Intravenous Q6H PRN Michael Boston, MD   4 mg at 06/27/15 1701   Or  . ondansetron (ZOFRAN) 8 mg in sodium chloride 0.9 % 50 mL IVPB  8 mg Intravenous Q6H PRN Michael Boston, MD      . ondansetron [facility]) tablet 4 mg  4 mg Oral TID AC & HS Michael Boston, MD   4 mg at 06/28/15 0827  . pantoprazole (PROTONIX) EC tablet 80 mg  80 mg Oral Daily Michael Boston, MD   80 mg at 06/28/15 1037  . phenol (CHLORASEPTIC) mouth spray 2 spray  2 spray Mouth/Throat PRN Michael Boston, MD      . potassium chloride SA (K-DUR,KLOR-CON) CR tablet 40 mEq  40 mEq Oral BID Theodis Blaze, MD   40 mEq at 06/28/15 1037  . prochlorperazine (COMPAZINE) tablet 5 mg  5 mg Oral Q6H PRN Michael Boston, MD      . sodium chloride 0.9 % bolus 2,000 mL  2,000 mL Intravenous Once Michael Boston, MD      . sodium chloride flush (NS) 0.9 % injection 10-40 mL  10-40 mL Intracatheter Q12H Theodis Blaze, MD   10 mL at 06/26/15 1353  . traMADol (ULTRAM) tablet 50-100 mg  50-100 mg Oral Q6H PRN Michael Boston, MD   100 mg at 06/27/15 [id number]     Discharge Medications: Please see discharge summary for a list of discharge medications.  Relevant Imaging Results:  Relevant Lab Results:   Additional Information SSN: [id number]  Standley Brooking, LCSW

## 2015-06-28 NOTE — Progress Notes (Signed)
Pt vomited about 550 cc green bile.  She said she thinks it was brought on by her trying to drink some juice.  She stated that every time she tries to eat or drink anything, feels nauseous.A;so having bloody, mucous discharge from rectum.  Pt also concerned that she has a sharp, cramping pain in her left flank are.  She also describes it as colicky.  She said the pain "catches her" when she tried to move.

## 2015-06-29 DIAGNOSIS — R1110 Vomiting, unspecified: Secondary | ICD-10-CM

## 2015-06-29 DIAGNOSIS — R935 Abnormal findings on diagnostic imaging of other abdominal regions, including retroperitoneum: Secondary | ICD-10-CM

## 2015-06-29 DIAGNOSIS — K30 Functional dyspepsia: Secondary | ICD-10-CM

## 2015-06-29 DIAGNOSIS — E860 Dehydration: Secondary | ICD-10-CM

## 2015-06-29 LAB — COMPREHENSIVE METABOLIC PANEL
ALT: 97 U/L — AB (ref 14–54)
ANION GAP: 9 (ref 5–15)
AST: 99 U/L — ABNORMAL HIGH (ref 15–41)
Albumin: 2.4 g/dL — ABNORMAL LOW (ref 3.5–5.0)
Alkaline Phosphatase: 233 U/L — ABNORMAL HIGH (ref 38–126)
BILIRUBIN TOTAL: 0.6 mg/dL (ref 0.3–1.2)
BUN: 19 mg/dL (ref 6–20)
CHLORIDE: 107 mmol/L (ref 101–111)
CO2: 22 mmol/L (ref 22–32)
Calcium: 9.1 mg/dL (ref 8.9–10.3)
Creatinine, Ser: 1.14 mg/dL — ABNORMAL HIGH (ref 0.44–1.00)
GFR calc Af Amer: 56 mL/min — ABNORMAL LOW (ref >60)
GFR, EST NON AFRICAN AMERICAN: 49 mL/min — AB (ref >60)
GLUCOSE: 143 mg/dL — AB (ref 65–99)
POTASSIUM: 4.3 mmol/L (ref 3.5–5.1)
Sodium: 138 mmol/L (ref 135–145)
TOTAL PROTEIN: 7.4 g/dL (ref 6.5–8.1)

## 2015-06-29 LAB — MAGNESIUM: MAGNESIUM: 2.1 mg/dL (ref 1.7–2.4)

## 2015-06-29 LAB — GLUCOSE, CAPILLARY
GLUCOSE-CAPILLARY: 111 mg/dL — AB (ref 65–99)
GLUCOSE-CAPILLARY: 119 mg/dL — AB (ref 65–99)
GLUCOSE-CAPILLARY: 122 mg/dL — AB (ref 65–99)
Glucose-Capillary: 113 mg/dL — ABNORMAL HIGH (ref 65–99)
Glucose-Capillary: 123 mg/dL — ABNORMAL HIGH (ref 65–99)

## 2015-06-29 LAB — PHOSPHORUS: PHOSPHORUS: 3.2 mg/dL (ref 2.5–4.6)

## 2015-06-29 MED ORDER — TRACE MINERALS CR-CU-MN-SE-ZN 10-1000-500-60 MCG/ML IV SOLN
INTRAVENOUS | Status: AC
  Administered 2015-06-29: 18:00:00 via INTRAVENOUS
  Filled 2015-06-29: qty 2280

## 2015-06-29 MED ORDER — LACTATED RINGERS IV BOLUS (SEPSIS)
2000.0000 mL | INTRAVENOUS | Status: DC
  Administered 2015-06-29: 1000 mL via INTRAVENOUS
  Administered 2015-07-02 – 2015-07-05 (×2): 2000 mL via INTRAVENOUS

## 2015-06-29 MED ORDER — METOCLOPRAMIDE HCL 5 MG/ML IJ SOLN
10.0000 mg | Freq: Three times a day (TID) | INTRAMUSCULAR | Status: DC
  Administered 2015-06-29 – 2015-07-02 (×12): 10 mg via INTRAVENOUS
  Filled 2015-06-29 (×18): qty 2

## 2015-06-29 MED ORDER — FAT EMULSION 20 % IV EMUL
240.0000 mL | INTRAVENOUS | Status: AC
  Administered 2015-06-29: 240 mL via INTRAVENOUS
  Filled 2015-06-29: qty 250

## 2015-06-29 MED ORDER — ONDANSETRON HCL 4 MG/2ML IJ SOLN
4.0000 mg | Freq: Four times a day (QID) | INTRAMUSCULAR | Status: DC
  Administered 2015-06-29 – 2015-07-02 (×12): 4 mg via INTRAVENOUS
  Filled 2015-06-29 (×11): qty 2

## 2015-06-29 NOTE — Progress Notes (Signed)
Patient vomited around 0630 this AM. Patient does not want to take AM PO meds because she is still has nausea. Did have IV Zofran after vomiting,patient states it has not helped.will continue to monitor patient.C.Michol Emory,RN

## 2015-06-29 NOTE — Progress Notes (Addendum)
Patient ID: Brianna Walker, female   DOB: October 08, 1945, 70 y.o.   MRN: [id number] [facility] PROGRESS NOTE  Brianna Walker [id number] DOB: January 31, 1946 DOA: 06/21/2015 PCP: Cathlean Cower, MD  Brief narrative:    70 year old female status post laparoscopic and robotic resection of GIST TUMOR with lysis of adhesions on 2/3, discharged on 2/17. Pt presented to [facility] ED for evaluation of nausea vomiting, high ileostomy output, poor appetite and subjective fevers. General surgery consulted due to recent surgery. CT abdomen pelvis showed postsurgical changes, postsurgical ileus vs partial small bowel obstruction, small hiatal hernia. Chest x-ray was negative. Creatinine has increased from 0.98 to 5.91.  Hospital course is complicated with delayed gastric emptying which was noted on the study done 06/26/2015. Patient is on TPN for nutritional support. In addition, she has ongoing bloody mucus diarrhea and we consulted GI 06/29/2015.   Assessment/Plan:    Principal Problem:  AKI (acute kidney injury) ([place]) - Likely due to GI losses due to dehydration  - Cr 1.14 today, stable   Active Problems:   Diarrhea, blood and mucus in stool - C.diff on 2/20 was negative - GI pathogen panel negative - Appreciate GI consult and recommendations    Vomiting / Delayed gastric emptying / small bowel obstruction - Delayed gastric emptying seen on the study 06/26/2015 - She did have x-ray of the abdomen 06/28/2015 concerning for small bowel obstruction - Continue supportive care for nausea and vomiting - Continue TPN for nutritional support - Appreciate surgery following   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   DVT Prophylaxis  - Pt is on full dose anticoagulation with apixaban   Code Status: Full.  Family Communication:  plan of care discussed with the patient Disposition Plan: by 3/3 if her GI symptoms better, if diarrhea improves.  IV access:  Peripheral IV  Procedures and diagnostic studies:     Dg Abd Acute W/chest 06/28/2015  Gaseous distended loops of small bowel within the central abdomen without distal colonic gas concerning for small bowel obstruction. Small left pleural effusion with underlying opacities favored to represent atelectasis. Gas within the soft tissues overlying the abdomen.   Nm Gastric Emptying 06/26/2015  Significantly delayed  gastric emptying study.   Ct Abdomen Pelvis Wo Contrast 06/21/2015 1. Stable postsurgical changes, as described above. 2. Mildly improved postsurgical jejunal ileus or partial obstruction, with transition in the left mid abdomen. This could be an indication of an adhesion. 3. Small hiatal hernia. 4. Colonic diverticulosis.   Dg Chest Portable 1 View 06/21/2015 Stable. No acute findings.   Dg Chest Port 1 View 06/21/2015 No acute cardiopulmonary disease.   Medical Consultants:  Surgery GI  IAnti-Infectives:   None    Leisa Lenz, MD  [facility] Pager [phone]  Time spent in minutes: 25 minutes  If 7PM-7AM, please contact night-coverage [email or web address] Password [facility] 06/29/2015, 11:47 AM   LOS: 8 days    HPI/Subjective:  No acute overnight events. Patient reports nausea and vomiting again this am. She still has mucus and blood and diarrhea.   Objective: Filed Vitals:   06/27/15 2125 06/28/15 0621 06/28/15 2146  06/29/15 0428  BP: 110/73 117/72 115/63 109/66  Pulse: 106 102 106 118  Temp: 99.1 F (37.3 C) 98.7 F (37.1 C) 98.8 F (37.1 C) 98.4 F (36.9 C)  TempSrc: Oral Oral Oral Oral  Resp: 18 18 18 20   Height:      Weight:      SpO2: 99% 99% 100% 98%    Intake/Output Summary (Last 24 hours) at 06/29/15 1147 Last data filed at 06/29/15 1047  Gross per 24 hour  Intake      0 ml  Output   1827 ml  Net  -1827 ml    Exam:   General:  Pt is alert, awake, no distress  Cardiovascular: Rate controlled, S1/S2  (+)  Respiratory: no wheezing, no rhonchi   Abdomen: non tender, (+) BS, RUQ ileostomy   Extremities: No swelling, palpable pulses   Neuro: Nonfocal  Data Reviewed: Basic Metabolic Panel:  Recent Labs Lab 06/25/15 0648 06/25/15 1350 06/26/15 0441 06/27/15 0448 06/28/15 0649 06/29/15 0411  NA 136 136 136 138 137 138  K 3.1* 3.0* 2.9* 2.9* 4.1 4.3  CL 101 102 99* 100* 103 107  CO2 24 23 27 27 25 22   GLUCOSE 110* 120* 145* 115* 116* 143*  BUN 13 12 14 20 16 19   CREATININE 1.19* 1.08* 1.21* 1.34* 1.06* 1.14*  CALCIUM 8.1* 8.0* 8.4* 8.8* 8.8* 9.1  MG 1.2*  --  2.0 1.8 1.6* 2.1  PHOS  --   --  2.3* 3.3 3.2 3.2   Liver Function Tests:  Recent Labs Lab 06/26/15 0441 06/28/15 0649 06/29/15 0411  AST 39 53* 99*  ALT 42 74* 97*  ALKPHOS 177* 198* 233*  BILITOT 1.2 0.8 0.6  PROT 6.7 6.8 7.4  ALBUMIN 2.4* 2.3* 2.4*   No results for input(s): LIPASE, AMYLASE in the last 168 hours. No results for input(s): AMMONIA in the last 168 hours. CBC:  Recent Labs Lab 06/23/15 0720 06/24/15 0900 06/25/15 0648 06/26/15 0441 06/28/15 0649  WBC 5.6 7.9 9.0 8.1 6.8  NEUTROABS  --   --   --  6.4 4.8  HGB 7.8* 7.8* 8.3* 8.0* 8.0*  HCT 23.7* 24.1* 25.3* 24.5* 24.4*  MCV 84.9 84.9 83.8 83.3 84.1  PLT 309 324 354 325 389   Cardiac Enzymes: No results for input(s): CKTOTAL, CKMB, CKMBINDEX, TROPONINI in the last 168 hours. BNP: Invalid input(s):  POCBNP CBG:  Recent Labs Lab 06/28/15 0544 06/28/15 1226 06/28/15 1758 06/29/15 0002 06/29/15 0554  GLUCAP 104* 109* 132* 123* 111*    Urine culture     Status: None   Collection Time: 06/21/15  1:16 PM  Result Value Ref Range Status   Specimen Description URINE, CATHETERIZED  Final    NO GROWTH 1 DAY   Report Status 06/22/2015 FINAL  Final  C difficile quick scan w PCR reflex     Status: None   Collection Time: 06/21/15  7:21 PM  Result Value Ref Range Status   C Diff antigen NEGATIVE NEGATIVE Final   C Diff toxin NEGATIVE NEGATIVE Final   C Diff interpretation Negative for toxigenic C. difficile  Final  Gastrointestinal Panel by PCR , Stool     Status: None   Collection Time: 06/21/15  7:21 PM  Result Value Ref Range Status  Campylobacter species NOT DETECTED NOT DETECTED Final   Plesimonas shigelloides NOT DETECTED NOT DETECTED Final   Salmonella species NOT DETECTED NOT DETECTED Final   Yersinia enterocolitica NOT DETECTED NOT DETECTED Final   Vibrio species NOT DETECTED NOT DETECTED Final   Vibrio cholerae NOT DETECTED NOT DETECTED Final   Enteroaggregative E coli (EAEC) NOT DETECTED NOT DETECTED Final   Enteropathogenic E coli (EPEC) NOT DETECTED NOT DETECTED Final   Enterotoxigenic E coli (ETEC) NOT DETECTED NOT DETECTED Final   Shiga like toxin producing E coli (STEC) NOT DETECTED NOT DETECTED Final   E. coli O157 NOT DETECTED NOT DETECTED Final   Shigella/Enteroinvasive E coli (EIEC) NOT DETECTED NOT DETECTED Final   Cryptosporidium NOT DETECTED NOT DETECTED Final   Cyclospora cayetanensis NOT DETECTED NOT DETECTED Final   Entamoeba histolytica NOT DETECTED NOT DETECTED Final   Giardia lamblia NOT DETECTED NOT DETECTED Final   Adenovirus F40/41 NOT DETECTED NOT DETECTED Final   Astrovirus NOT DETECTED NOT DETECTED Final   Norovirus GI/GII NOT DETECTED NOT DETECTED Final   Rotavirus A NOT DETECTED NOT DETECTED Final   Sapovirus (I, II, IV, and V) NOT DETECTED  NOT DETECTED Final     Scheduled Meds: . apixaban  5 mg Oral BID  . aspirin  81 mg Oral QHS  . estradiol  0.05 mg Transdermal Weekly  . ferrous sulfate  325 mg Oral BID WC  . insulin aspart  0-15 Units Subcutaneous 4 times per day  . megestrol  400 mg Oral Daily  . metoCLOPramide  10 mg Oral TID AC & HS  . ondansetron  4 mg Oral TID AC & HS  . pantoprazole  80 mg Oral BID  . potassium chloride  40 mEq Oral BID  . protein supplement shake  8 oz Oral BID BM   Continuous Infusions: . Marland KitchenTPN (CLINIMIX-E) Adult 80 mL/hr at 06/28/15 1705   And  . fat emulsion 240 mL (06/28/15 1705)  . Marland KitchenTPN (CLINIMIX-E) Adult     And  . fat emulsion

## 2015-06-29 NOTE — Progress Notes (Signed)
Ms. Brandstetter, [place]. Hx Colon CA, has  Ileostomy. She had a small bowel movement from rectum and it was observed that she has a membrane/prolapse protuding from rectum approximately 6 cm. She stated it could be  from severe vomiting/straining. Thx.

## 2015-06-29 NOTE — Progress Notes (Signed)
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 Resection of cystic mass near small bowel with serosal repair.  Surgeon(s): Michael Boston, MD Leighton Ruff, MD - Assist   Assessment  RECURRENT N/V with delayed gastric emptying   Plan:  -DGE/gastroparesis.  Zofran RTC.  Try IV Reglan.  GI consult help appreciated.  Hold iron for possible ileus  -mild ileostomy stenosis but no stricture with good output & 18Fr NGT easily intubates - doubt cause of n/v.  AXR w SB gaseous distention.  No colonic air because colon diverted.  ARF/dehydration - nearly resolved but poor PO intake.  Check orthostatic VS.  2L IVF q MWF  -Poor PO with intermittent n/v & no evidence of obstruction.  Scheduled Zofran.  Try Megace to stimulate appetite.  TNA until eating better.  If not improved in a few days, SNF with TNA until surgery  -Skin separation at pink ileostomy with skin necrosis - ostomy care.  Reflection of malnutrition. OSTOMY CARE / TRAINING.  Complex packing/pouching working so far.  Large wound but granulating. >3  weeks postop = would treat as EC fistula & pouch & try to get nutrition better.  I worry attempt at ostomy revision would be even worse.  Can try to take ileostomy down if > 6 weeks & Albumin >3 to minimize risks of leak/breakdown  -replace lytes PRN  -antidiarrheal regimen as tolerated once tolerating PO in order to avoid dehydration.  Holding given gastroparesis.  Challenging with her nausea issues.    -VTE prophylaxis- SCDs, etc  -GERD - inc PPI  -hyperlipidemia - Tx  -HTN  - No ACE Inh  -h/o PE - Eliquis as tolerated.    -mobilize as tolerated to help recovery.  GET HER UP!  -Pathology - ?+margin most likely fracture of GIST exposed.  Most likely will get gleevec post-adjuvant for 3 years total per Dr Benay Spice.   D/C patient from hospital when patient meets criteria (anticipate in ???? day(s)):  Tolerating oral intake well Controlling diarrhea Ambulating well Adequate pain control without IV medications Urinating   Having flatus  Disposition planning in place - Probably SNF w ostomy care.  Increase HH IVF via PICC qMWF to 2L (instead of 1L qMWF)  Ostomy care/teaching    Adin [name], M.D., F.A.C.S. Gastrointestinal and Minimally Invasive Surgery [facility] [address] [place], [place] [address] [phone] Main / Paging   06/29/2015  Subjective:  Vomited this AM Stays in bed.  Claims to be walking OK No anorectal pain.  Scant drainage No pain at ileostomy  Appetite fair    Objective:  Vital signs:  Filed Vitals:  06/27/15 2125 06/28/15 0621 06/28/15 2146 06/29/15 0428  BP: 110/73 117/72 115/63 109/66  Pulse: 106 102 106 118  Temp: 99.1 F (37.3 C) 98.7 F (37.1 C) 98.8 F (37.1 C) 98.4 F (36.9 C)  TempSrc: Oral Oral Oral Oral  Resp: _0 Height:      Weight:      SpO2: 99% 99% 100% 98%    Last BM Date: 06/28/15  Intake/Output   Yesterday:  02/27 0701 - 02/28 0700 In: -  Out: 3903  [Urine:850; Emesis/NG output:350; Stool:577] This shift:  Total I/O In: -  Out: 50 [Stool:50]  Bowel function:  Flatus: YES  BM: thin effluent in bag.   Drain: n/a  Physical Exam:  General: Pt awake/alert/oriented x4 in no acute distress.  More calm.  Smiling, alert Eyes: PERRL, normal EOM.  Sclera clear.  No icterus Neuro: CN II-XII intact w/o focal sensory/motor deficits. Lymph: No head/neck/groin lymphadenopathy Psych:  No delerium/psychosis/paranoia HENT: Normocephalic, Mucus membranes moist.  No thrush Neck: Supple, No tracheal deviation Chest: No chest wall pain w good excursion CV:  Pulses intact.  Regular rhythm MS: Normal AROM mjr joints.  No obvious deformity  Abdomen: Soft but morbidly obese.  Nondistended.  Nontender.  No fluctuance/cellultis.  No guarding.  No evidence of peritonitis.  No incarcerated hernias.  Loop ileostomy pink with mild stenosis - NGT intubates easily into superior stoma of the loop (Need 45 deg cephalad angle) - moderate thin effluent out superior orifice.  Medial skin separation & skin necrosis - I excised dead skin with WOCN present.  Wound moderate & deep but good granulation & no necrosis.  Bag holding q3-4 days w/o leak (!).  No purulence/cellulitis.  No ostomy necrosis.    Ext:  SCDs BLE.  No mjr edema.  No cyanosis Skin: No petechiae / purpura  Results:   Diagnosis 1. Soft tissue, biopsy, abdominal wall cyst ? fistula BENIGN CYST AND REACTIVE CHANGES NEGATIVE FOR MALIGNANCY 2. Soft tissue mass, simple excision, Pelvic GIST GASTROINTESTINAL STROMAL TUMORS OF THE RECTUM SHOWING TREATMENT EFFECT (9.5 CM) MARGIN OF RESECTION IS POSITIVE 3. Colon, segmental resection for tumor, rectum DIVERTICULA NO RESIDUAL GASTROINTESTINAL STROMAL TUMOR IDENTIFIED MARGINS OF RESECTION ARE NEGATIVE FIFTEEN BENIGN LYMPH NODES (0/15) Microscopic Comment 2. GASTROINTESTINAL STROMAL TUMOR (GIST): Procedure: segmental resection Tumor Site:  rectum Specify: pelvis rectum Tumor Size: 9.5 cm Tumor Focality Unifocal: focal GIST Subtype (spindle, epithelioid, mixed, etc.): spindle Mitotic Rate: 1 /50 HIGH POWER FIELD Necrosis: negative Histologic Grade 1 G1: Low grade; mitotic rate 5/50 HIGH POWER FIELD. G2: High grade; mitotic rate >5/50 HIGH POWER FIELD. Risk Assessment: _ Margins: positive Ancillary testing: Per request 1 of 3 FINAL for DALAYLA, ALDREDGE [phone]) Microscopic Comment(continued) Lymph nodes: number examined: 15; number positive: 0 Pathologic Staging: pT3 pN0 Comments: The tumor shows treatment effect with hypocellular, atrophic spindle cells and hyalinized, fibrotic stroma. less than 1% of the tumor without treatment effect. [name] MD Pathologist, Electronic Signature (Case signed 06/10/2015) Specimen Etai Copado and Clinical Information Specimen(s) Obtained: 1. Soft tissue, biopsy, abdominal wall cyst ? fistula 2. Soft tissue mass, simple excision, Pelvic GIST 3. Colon, segmental resection for tumor, rectum Specimen Clinical Information 1. GIST tumor of rectum in pelvis (kp) [place] 1. In formalin is a a 3.1 x 1.4 x 0.3 cm irregular portion of fatty tissue, which on sectioning contains a 0.5 cm collapsed smooth cystic space. Sectioned and entirely submitted in one block. 2. Received in formalin is a 176  gram, 9.5 x 6.8 x 5.8 cm rubbery to firm mass, which has an attached suture over a 2.1 x 1.5 cm roughened area, clinically identifying anterior distal. This area is inked orange. There is also a 5.0 x 4.2 cm disrupted area, which is clinically identified as disrupted margin. This area is inked black. Also along one aspect is a 3.4 x 3.3 cm area of tan to hyperemic possible smooth mucosa. The cut surfaces of the mass are tan to tan yellow, with vaguely whorled cut surfaces, and scattered myxoid areas. Representative sections are submitted as follows: A, B = sections from clinically disrupted margin C  = sections from area identified by suture as anterior distal D, E = central sections Total: 5 blocks submitted 3. Received in formalin is a 27 cm in length segment of colon, clinically recto-sigmoid, which includes at least probable middle third of rectum. Surrounding mesorectum is incomplete and focally disrupted. There is an attached suture over the anterior perirectal surface. The mucosa is tan pink, smooth, soft with normal intestinal folds. There are no mucosal lesions identified. Within the sigmoid portion are several unperforated diverticula. Found within the surrounding fat are 15 possible lymph nodes ranging from 0.1 to 0.6 cm. Block Summary: A = proximal margin B, C = distal margin D, E = sections taken at attached suture over anterior mesorectum F = diverticulum G = four nodes, whole H = four nodes, whole I = four nodes, whole J = three nodes, whole Total: 10 blocks submitted (SW:ds 06/07/15) 2 of 3 FINAL for TOMARA, YOUNGBERG ([id number]) Report signed out from the following location(s) Technical component and interpretation was performed at [facility], [place], [place] [address]. CLIA #: [id number], 3 of 3   Labs: Results for orders placed or performed during the hospital encounter of 06/21/15 (from the past 48 hour(s))  Glucose, capillary     Status: Abnormal   Collection Time: 06/27/15  6:47 PM  Result Value Ref Range   Glucose-Capillary 142 (H) 65 - 99 mg/dL  Glucose, capillary     Status: Abnormal   Collection Time: 06/28/15 12:00 AM  Result Value Ref Range   Glucose-Capillary 106 (H) 65 - 99 mg/dL  Glucose, capillary     Status: Abnormal   Collection Time: 06/28/15  5:44 AM  Result Value Ref Range   Glucose-Capillary 104 (H) 65 - 99 mg/dL  Magnesium     Status: Abnormal   Collection Time: 06/28/15  6:49 AM  Result Value Ref Range   Magnesium 1.6 (L) 1.7 - 2.4 mg/dL  Comprehensive metabolic panel     Status: Abnormal    Collection Time: 06/28/15  6:49 AM  Result Value Ref Range   Sodium 137 135 - 145 mmol/L   Potassium 4.1 3.5 - 5.1 mmol/L    Comment: REPEATED TO VERIFY DELTA CHECK NOTED NO VISIBLE HEMOLYSIS    Chloride 103 101 - 111 mmol/L   CO2 25 22 - 32 mmol/L   Glucose, Bld 116 (H) 65 - 99 mg/dL   BUN 16 6 - 20 mg/dL   Creatinine, Ser 1.06 (H) 0.44 - 1.00 mg/dL   Calcium 8.8 (L) 8.9 - 10.3 mg/dL   Total Protein 6.8 6.5 - 8.1 g/dL   Albumin 2.3 (L) 3.5 - 5.0 g/dL   AST 53 (H) 15 - 41 U/L   ALT 74 (H) 14 - 54 U/L   Alkaline Phosphatase 198 (H) 38 - 126 U/L   Total Bilirubin 0.8  0.3 - 1.2 mg/dL   GFR calc non Af Amer 53 (L) >60 mL/min   GFR calc Af Amer >60 >60 mL/min    Comment: (NOTE) The eGFR has been calculated using the CKD EPI equation. This calculation has not been validated in all clinical situations. eGFR's persistently <60 mL/min signify possible Chronic Kidney Disease.    Anion gap 9 5 - 15  Phosphorus     Status: None   Collection Time: 06/28/15  6:49 AM  Result Value Ref Range   Phosphorus 3.2 2.5 - 4.6 mg/dL  CBC     Status: Abnormal   Collection Time: 06/28/15  6:49 AM  Result Value Ref Range   WBC 6.8 4.0 - 10.5 K/uL   RBC 2.90 (L) 3.87 - 5.11 MIL/uL   Hemoglobin 8.0 (L) 12.0 - 15.0 g/dL   HCT 24.4 (L) 36.0 - 46.0 %   MCV 84.1 78.0 - 100.0 fL   MCH 27.6 26.0 - 34.0 pg   MCHC 32.8 30.0 - 36.0 g/dL   RDW 15.8 (H) 11.5 - 15.5 %   Platelets 389 150 - 400 K/uL  Differential     Status: None   Collection Time: 06/28/15  6:49 AM  Result Value Ref Range   Neutrophils Relative % 71 %   Neutro Abs 4.8 1.7 - 7.7 K/uL   Lymphocytes Relative 16 %   Lymphs Abs 1.1 0.7 - 4.0 K/uL   Monocytes Relative 10 %   Monocytes Absolute 0.7 0.1 - 1.0 K/uL   Eosinophils Relative 3 %   Eosinophils Absolute 0.2 0.0 - 0.7 K/uL   Basophils Relative 0 %   Basophils Absolute 0.0 0.0 - 0.1 K/uL  Triglycerides     Status: Abnormal   Collection Time: 06/28/15  6:49 AM  Result Value Ref  Range   Triglycerides 180 (H) <150 mg/dL    Comment: Performed at [facility]  Prealbumin     Status: Abnormal   Collection Time: 06/28/15  6:49 AM  Result Value Ref Range   Prealbumin 9.6 (L) 18 - 38 mg/dL    Comment: Performed at [facility]  Glucose, capillary     Status: Abnormal   Collection Time: 06/28/15 12:26 PM  Result Value Ref Range   Glucose-Capillary 109 (H) 65 - 99 mg/dL  Glucose, capillary     Status: Abnormal   Collection Time: 06/28/15  5:58 PM  Result Value Ref Range   Glucose-Capillary 132 (H) 65 - 99 mg/dL  Glucose, capillary     Status: Abnormal   Collection Time: 06/29/15 12:02 AM  Result Value Ref Range   Glucose-Capillary 123 (H) 65 - 99 mg/dL  Magnesium     Status: None   Collection Time: 06/29/15  4:11 AM  Result Value Ref Range   Magnesium 2.1 1.7 - 2.4 mg/dL  Comprehensive metabolic panel     Status: Abnormal   Collection Time: 06/29/15  4:11 AM  Result Value Ref Range   Sodium 138 135 - 145 mmol/L   Potassium 4.3 3.5 - 5.1 mmol/L   Chloride 107 101 - 111 mmol/L   CO2 22 22 - 32 mmol/L   Glucose, Bld 143 (H) 65 - 99 mg/dL   BUN 19 6 - 20 mg/dL   Creatinine, Ser 1.14 (H) 0.44 - 1.00 mg/dL   Calcium 9.1 8.9 - 10.3 mg/dL   Total Protein 7.4 6.5 - 8.1 g/dL   Albumin 2.4 (L) 3.5 - 5.0 g/dL   AST 99 (  H) 15 - 41 U/L   ALT 97 (H) 14 - 54 U/L   Alkaline Phosphatase 233 (H) 38 - 126 U/L   Total Bilirubin 0.6 0.3 - 1.2 mg/dL   GFR calc non Af Amer 49 (L) >60 mL/min   GFR calc Af Amer 56 (L) >60 mL/min    Comment: (NOTE) The eGFR has been calculated using the CKD EPI equation. This calculation has not been validated in all clinical situations. eGFR's persistently <60 mL/min signify possible Chronic Kidney Disease.    Anion gap 9 5 - 15  Phosphorus     Status: None   Collection Time: 06/29/15  4:11 AM  Result Value Ref Range   Phosphorus 3.2 2.5 - 4.6 mg/dL  Glucose, capillary     Status: Abnormal   Collection Time: 06/29/15  5:54  AM  Result Value Ref Range   Glucose-Capillary 111 (H) 65 - 99 mg/dL  Glucose, capillary     Status: Abnormal   Collection Time: 06/29/15 12:13 PM  Result Value Ref Range   Glucose-Capillary 119 (H) 65 - 99 mg/dL      Imaging / Studies: Dg Abd Acute W/chest  06/28/2015  CLINICAL DATA:  Patient with abdominal pain and left-sided chest pain for multiple weeks. EXAM: DG ABDOMEN ACUTE W/ 1V CHEST COMPARISON:  Chest radiograph 06/21/2015.  Abdomen CT 06/21/2015. FINDINGS: Right upper extremity PICC line tip projects over the superior vena cava. Stable cardiac and mediastinal contours. New small left pleural effusion with underlying opacities. No pneumothorax. Multiple prominent and gaseous distended loops of small bowel are demonstrated within the central abdomen. No distal colonic gas is present. Cholecystectomy clips. Right hip arthroplasty. Lumbar spine degenerative changes. Gas throughout the soft tissues overlying the lower abdomen. Right lower quadrant mesh demonstrated. IMPRESSION: Gaseous distended loops of small bowel within the central abdomen without distal colonic gas concerning for small bowel obstruction. Small left pleural effusion with underlying opacities favored to represent atelectasis. Gas within the soft tissues overlying the abdomen. Electronically Signed   By: Lovey Newcomer M.D.   On: 06/28/2015 16:58    Medications / Allergies: per chart  Antibiotics: Anti-infectives    None        Note: Portions of this report may have been transcribed using voice recognition software. Every effort was made to ensure accuracy; however, inadvertent computerized transcription errors may be present.   Any transcriptional errors that result from this process are unintentional.     Adin [name], M.D., F.A.C.S. Gastrointestinal and Minimally Invasive Surgery [facility] [address] [place], [place] [address] [phone] Main /  Paging   06/29/2015  CARE TEAM:  PCP: Cathlean Cower, MD  Outpatient Care Team: Patient Care Team: Biagio Borg, MD as PCP - General Michael Boston, MD as Consulting Physician (General Surgery) Ladell Pier, MD as Consulting Physician (Oncology) Milus Banister, MD as Consulting Physician (Gastroenterology) Billy Fischer, MD as Consulting Physician (Family Medicine)  Inpatient Treatment Team: Treatment Team: Attending Provider: Robbie Lis, MD; Consulting Physician: Michael Boston, MD; Rounding Team: Ian Bushman, MD; Technician: Guadalupe Maple, NT; Technician: Sueanne Margarita, NT; Technician: Abbe Amsterdam, NT; Technician: Parks Ranger, NT; Registered Nurse: Bailey Mech, RN; Consulting Physician: Nolon Nations, MD; Consulting Physician: Irene Shipper, MD

## 2015-06-29 NOTE — Consult Note (Signed)
WOC ostomy follow up Stoma type/location: RLQ loop ileostomy with MC separation and full thickness wound Stomal assessment/size: 1 and 1/8 oblong Peristomal assessment: Not seen today, pouching system applied yesterday is intact.   Treatment options for stomal/peristomal skin: None today Output: thin, dark brown/green effluent Ostomy pouching: 1pc.convex with skin barrier rings and local wound care using powder and skin barrier rings. Education provided: Patient reports that she had emesis at 6am and that she was seen today by MD who reported explained the issues with her digestive tract.  She is today asking questions about the passing of mucus from her rectum and she is taught the physiology behind that and that it is normal.  Patient expresses relief and appreciation for education.   Enrolled patient in [place] Start Discharge program: Yes \\WOC  nursing team will follow, and will remain available to this patient, the nursing, surgical and medical teams.   Thanks, [place], MSN, RN, [place], [address]  Pager# [phone]

## 2015-06-29 NOTE — Progress Notes (Signed)
PARENTERAL NUTRITION CONSULT NOTE - FOLLOW UP  Pharmacy Consult for TPN Indication: intolerance to tube feeds  No Known Allergies  Patient Measurements: Height: '5\' 3"'  (160 cm) Weight: 202 lb 13.2 oz (92 kg) IBW/kg (Calculated) : 52.4 Adjusted Body Weight: 68kg Usual Weight: 102kg (lost 24lb in last month)  Vital Signs: Temp: 98.4 F (36.9 C) (02/28 0428) Temp Source: Oral (02/28 0428) BP: 109/66 mmHg (02/28 0428) Pulse Rate: 118 (02/28 0428) Intake/Output from previous day: 02/27 0701 - 02/28 0700 In: -  Out: [place] [Urine:850; Emesis/NG output:350; Stool:577] Intake/Output from this shift:    Labs:  Recent Labs  06/28/15 0649  WBC 6.8  HGB 8.0*  HCT 24.4*  PLT 389     Recent Labs  06/27/15 0448 06/28/15 0649 06/29/15 0411  NA 138 137 138  K 2.9* 4.1 4.3  CL 100* 103 107  CO2 '27 25 22  ' GLUCOSE 115* 116* 143*  BUN '20 16 19  ' CREATININE 1.34* 1.06* 1.14*  CALCIUM 8.8* 8.8* 9.1  MG 1.8 1.6* 2.1  PHOS 3.3 3.2 3.2  PROT  --  6.8 7.4  ALBUMIN  --  2.3* 2.4*  AST  --  53* 99*  ALT  --  74* 97*  ALKPHOS  --  198* 233*  BILITOT  --  0.8 0.6  PREALBUMIN  --  9.6*  --   TRIG  --  180*  --    Estimated Creatinine Clearance: 51.6 mL/min (by C-G formula based on Cr of 1.14).    Recent Labs  06/28/15 1758 06/29/15 0002 06/29/15 0554  GLUCAP 132* 123* 111*    Insulin Requirements:  Moderate SSI - 3 units Novolog  Current Nutrition: on megace Dysphagia 1 diet, no meal intake documented  IVF: none  Central access: PICC 2/7 TPN start date:  2/24  ASSESSMENT                                                                                                    HPI: [name] s/p laparoscopic and robotic resection of GIST TUMOR lysis of adhesions on 2/3. She was discharged on 2/17, but returned 2/20 with nausea vomiting, high ileostomy output, poor appetite and subjective fevers. She continues with poor oral intake and nausea/vomiting. Also positive for skin  breakdown at ostomy site as a result of malnutrition. No evidence of obstruction per surgery. Pharmacy consulted to start parenteral nutrition.   Significant events:  2/25: gastric emptying study - revealed significantly delayed gastric emptying.  562m green bilious vomitus noted per RN. On Reglan.  Today, 06/29/2015:    Glucose - at goal < 150 mg/dl  Electrolytes - K, Phos, Mag WNL, Corr Ca 10.38  Renal - SCr 1.14, has been slightly up d/t vomiting episodes  LFTs - Alk phos, AST/ALT elevated, could possibly be d/t TPN. May need to consider cycling the TPN.  TGs - 119 (2/25), 180 (2/27)  Prealbumin - 7.6 (2/25), 9.6 (2/27)  Pt continues to have poor appetite. On Megace, Reglan.  Will continue TPN until intake improves.  NUTRITIONAL GOALS  RD recs: 2200-2400 kcal, 120-130 gm protein Clinimix 5/15 at a goal rate of 142m/hr + 20% fat emulsion at 113mhr to provide: 126 g/day protein, 2269Kcal/day.  PLAN                                                                                                                         At 1800 today:  Patient has been tolerating slow advancement of Clinimix E 5/15, increase rate to 95 ml/hr tonight.  20% fat emulsion at 1064mr.  TPN to contain standard multivitamins and trace elements.  Continue mod SSI as ordered q6h  TPN lab panels on Mondays & Thursdays.  CMET, Phos, Mag in AM.  F/u daily.  AmaHershal CoriaharmD, BCPS Pager: [phone]/2017 8:57 AM

## 2015-06-29 NOTE — Care Management Important Message (Signed)
Important Message  Patient Details  Name: Brianna Walker MRN: [id number] Date of Birth: Dec 13, 1945   Medicare Important Message Given:  Yes    Camillo Flaming 06/29/2015, 1:41 PMImportant Message  Patient Details  Name: Brianna Walker MRN: [id number] Date of Birth: 1945/12/12   Medicare Important Message Given:  Yes    Camillo Flaming 06/29/2015, 1:41 PM

## 2015-06-29 NOTE — Consult Note (Signed)
Referring Provider: Dr. Charlies Silvers Primary Care Physician:  Cathlean Cower, MD Primary Gastroenterologist:  Dr. Fuller Plan   Reason for Consultation:  Nausea and vomiting  HPI: Brianna Walker is a 70 y.o. 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  Had "LOW ANTERIOR RESECTION WITH TRANSABDOMINAL AND TRANSANAL COLOANAL HANDSEWN ANASTOMOSIS, DIVERTING LOOP ILEOSTOMY, RESECTION GIST TUMOR, and LAPAROSCOPIC & ROBOTIC LYSIS OF ADHESIONS X 3 hours. Also had posterior rectovaginal repair, and resection of cystic mass near small bowel with serosal repair."  Pt presented back to [facility] ED on 2/20 for evaluation of nausea vomiting, high ileostomy output, poor appetite and subjective fevers. General surgery consulted due to recent surgery. CT abdomen pelvis showed postsurgical changes, postsurgical ileus vs partial small bowel obstruction, small hiatal hernia.  Creatinine esd increased from 0.98 to 5.91.  Has been on TPN starting 2/24 due to inability to tolerate PO.  Her Cr has improved with IVF's.  GES showed significantly delayed gastric emptying.  Abdominal x-ray just yesterday, 2/27, suggests possible obstruction as well.  She is having ostomy output, which has been between 400-600 mL's over the past couple of days.  She was started on Reglan 10 mg PO ACHS just yesterday.  Also getting zofran RTC.  Says that nothing seems to help with the nausea and vomiting.  Vomited this AM.  Denies abdominal pain.  Does report rectal mucus/discharge/blood, which patient says has been addressed with Dr. Johney Maine and he told her that this is not unexpected after her surgery.  LFT's also noted to be elevated for the past couple of days.  Were normal on admission.  Does not have a gallbladder.  AST 99, ALT 97, ALP 233, normal total bili.   Past Medical History  Diagnosis Date  . ALLERGIC RHINITIS 09/12/2007  . BACK PAIN 06/19/2008  . Cramp of limb 08/11/2009  .  Long term (current) use of anticoagulants 11/29/2010  . FREQUENCY, URINARY 10/07/2009  . GERD 12/05/2006  . HEMORRHOIDS 11/17/2006  . HIP PAIN, RIGHT, CHRONIC 08/11/2009  . HX, PERSONAL, TUBERCULOSIS 11/17/2006  . HYPERLIPIDEMIA 09/12/2007  . HYPERTENSION 11/17/2006  . INSOMNIA-SLEEP DISORDER-UNSPEC 06/19/2008  . LEG PAIN, RIGHT 06/19/2008  . MASS, SUPERFICIAL 09/12/2007  . MENOPAUSAL DISORDER 09/20/2009  . OVERACTIVE BLADDER 03/04/2010  . PULMONARY EMBOLISM, HX OF 11/17/2006    [place] [facility] s/p Pneumonia  . RECTAL BLEEDING 10/07/2007  . SCIATICA, RIGHT 09/12/2007  . SKIN LESION 07/20/2009  . TMJ SYNDROME 11/17/2006  . Iron deficiency anemia 12/07/2011  . Diverticulosis   . Hyperlipidemia 09/12/2007    Qualifier: Diagnosis of  By: Jenny Reichmann MD, Hunt Oris   . Arthritis   . Colon polyps   . DEEP VENOUS THROMBOPHLEBITIS, LEG, RIGHT 03/04/2010    right leg  . Pneumonia   . Rectal mass 06/2014  . Cancer ([place])     dx. 4'16 -oral chemotherapy only.  . Shortness of breath dyspnea     With exertion since chemotherapy tx-oral meds  . Tuberculosis     pt. was tx.    Past Surgical History  Procedure Laterality Date  . Cholecystectomy open    . Abdominal hysterectomy    . Incisional hernia repair  2002      multiple ventral hernia repair/mesh  . Tumor removed off of right shoulder Right   . S/p right hip replaced min invasive hip surgury [facility] oct 2011 Right 2011  . Hemorroid surgery  removed at dr Silvio Pate office    x 2  . Eus N/A 08/06/2014    Procedure: LOWER ENDOSCOPIC ULTRASOUND (EUS);  Surgeon: Milus Banister, MD;  Location: [facility];  Service: Endoscopy;  Laterality: N/A;  . Incisional hernia repair  01/13/2005    open w onlay Proceed mesh.  Mechele Claude robotic assisted lower anterior resection N/A 06/04/2015    Procedure: XI ROBOTIC ASSISTED LYSIS OF ADHESIONS, LOWER ANTERIOR RESECTION TRANS ABDOMINAL AND TRANS ANAL, COLOANAL HANDSEWN ANASTAOSIS, DIVERTING LOPE ILIOSTOMY, RESECTION GIST  TUMOR;  Surgeon: Michael Boston, MD;  Location: [facility];  Service: General;  Laterality: N/A;  . Laparoscopic lysis of adhesions  06/04/2015    Procedure: LAPAROSCOPIC LYSIS OF ADHESIONS;  Surgeon: Michael Boston, MD;  Location: [facility];  Service: General;;    Prior to Admission medications   Medication Sig Start Date End Date Taking? Authorizing Provider  apixaban (ELIQUIS) 5 MG TABS tablet Take 1 tablet (5 mg total) by mouth 2 (two) times daily. 09/04/14  Yes Biagio Borg, MD  aspirin 81 MG tablet Take 81 mg by mouth at bedtime.    Yes Historical Provider, MD  estradiol (VIVELLE-DOT) 0.025 MG/24HR APPLY 1 PATCH ONTO THE SKIN TWO TIMES WEEKLY Patient taking differently: APPLY 1 PATCH ONTO SKIN EVERY SUNDAY. 01/15/15  Yes Biagio Borg, MD  ferrous sulfate 325 (65 FE) MG tablet Take 1 tablet (325 mg total) by mouth daily. 09/04/14  Yes Biagio Borg, MD  omeprazole (PRILOSEC) 20 MG capsule Take 1 capsule (20 mg total) by mouth every evening. 09/04/14 09/04/15 Yes Biagio Borg, MD  pravastatin (PRAVACHOL) 20 MG tablet Take 1 tablet (20 mg total) by mouth daily. Patient taking differently: Take 20 mg by mouth at bedtime.  09/04/14  Yes Biagio Borg, MD  prochlorperazine (COMPAZINE) 5 MG tablet Take 1 tablet (5 mg total) by mouth every 6 (six) hours as needed for nausea or vomiting. 02/04/15  Yes Owens Shark, NP  GLEEVEC 400 MG tablet TAKE 1 TABLET BY MOUTH ONCE DAILY WITH MEALS AND LARGE GLASS OF WATER 05/19/15   Ladell Pier, MD  loperamide (IMODIUM) 2 MG capsule Take 1-2 capsules (2-4 mg total) by mouth every 8 (eight) hours as needed for diarrhea or loose stools (Use if >2 BM every 8 hours). 06/24/15   Theodis Blaze, MD  ondansetron (ZOFRAN) 4 MG tablet Take 1 tablet (4 mg total) by mouth every 6 (six) hours as needed for nausea. 06/24/15   Theodis Blaze, MD  sodium chloride 0.9 % infusion Inject 2,000 mLs into the vein 3 (three) times a week. 06/24/15   Theodis Blaze, MD  traMADol (ULTRAM) 50 MG tablet Take 1  tablet (50 mg total) by mouth every 6 (six) hours as needed for moderate pain. 06/24/15   Theodis Blaze, MD    Current Facility-Administered Medications  Medication Dose Route Frequency Provider Last Rate Last Dose  . Marland KitchenTPN (CLINIMIX-E) Adult   Intravenous Continuous TPN Dara Hoyer, RPH 80 mL/hr at 06/28/15 1705     And  . fat emulsion 20 % infusion 240 mL  240 mL Intravenous Continuous TPN Dara Hoyer, RPH 10 mL/hr at 06/28/15 1705 240 mL at 06/28/15 1705  . Marland KitchenTPN (CLINIMIX-E) Adult   Intravenous Continuous TPN Dara Hoyer, RPH       And  . fat emulsion 20 % infusion 240 mL  240 mL Intravenous Continuous TPN Dara Hoyer, RPH      .  alum & mag hydroxide-simeth (MAALOX/MYLANTA) 200-200-20 MG/5ML suspension 30 mL  30 mL Oral Q6H PRN Michael Boston, MD   30 mL at 06/25/15 2131  . apixaban (ELIQUIS) tablet 5 mg  5 mg Oral BID Theodis Blaze, MD   5 mg at 06/28/15 2330  . aspirin chewable tablet 81 mg  81 mg Oral QHS Michael Boston, MD   81 mg at 06/28/15 2332  . diphenhydrAMINE (BENADRYL) injection 12.5-25 mg  12.5-25 mg Intravenous Q6H PRN Michael Boston, MD      . estradiol [facility] - Dosed in mg/24 hr) patch 0.05 mg  0.05 mg Transdermal Weekly Michael Boston, MD   0.05 mg at 06/25/15 1229  . ferrous sulfate tablet 325 mg  325 mg Oral BID WC Michael Boston, MD   325 mg at 06/28/15 1654  . HYDROmorphone (DILAUDID) injection 0.5-1 mg  0.5-1 mg Intravenous Q4H PRN Theodis Blaze, MD   1 mg at 06/28/15 2339  . insulin aspart (novoLOG) injection 0-15 Units  0-15 Units Subcutaneous 4 times per day Thomes Lolling, RPH   1 Units at 06/29/15 0100  . levalbuterol (XOPENEX) nebulizer solution 0.63 mg  0.63 mg Nebulization Q6H PRN Reyne Dumas, MD      . lip balm (CARMEX) ointment 1 application  1 application Topical BID Michael Boston, MD   1 application at [id number]  . loperamide (IMODIUM) capsule 2-4 mg  2-4 mg Oral Q8H PRN Michael Boston, MD      . LORazepam (ATIVAN) injection 0.5-1 mg  0.5-1 mg  Intravenous Q8H PRN Michael Boston, MD      . magic mouthwash  15 mL Oral QID PRN Michael Boston, MD      . megestrol (MEGACE) 40 MG/ML suspension 400 mg  400 mg Oral Daily Michael Boston, MD   400 mg at 06/28/15 1038  . menthol-cetylpyridinium (CEPACOL) lozenge 3 mg  1 lozenge Oral PRN Michael Boston, MD      . metoCLOPramide (REGLAN) tablet 10 mg  10 mg Oral TID AC & HS Michael Boston, MD   10 mg at 06/29/15 0214  . ondansetron (ZOFRAN) injection 4 mg  4 mg Intravenous Q6H PRN Michael Boston, MD   4 mg at 06/29/15 [id number]   Or  . ondansetron (ZOFRAN) 8 mg in sodium chloride 0.9 % 50 mL IVPB  8 mg Intravenous Q6H PRN Michael Boston, MD      . ondansetron [facility]) tablet 4 mg  4 mg Oral TID AC & HS Michael Boston, MD   4 mg at 06/28/15 2327  . pantoprazole (PROTONIX) EC tablet 80 mg  80 mg Oral BID Michael Boston, MD   80 mg at 06/28/15 2329  . phenol (CHLORASEPTIC) mouth spray 2 spray  2 spray Mouth/Throat PRN Michael Boston, MD      . potassium chloride SA (K-DUR,KLOR-CON) CR tablet 40 mEq  40 mEq Oral BID Theodis Blaze, MD   40 mEq at 06/28/15 2327  . prochlorperazine (COMPAZINE) tablet 5-10 mg  5-10 mg Oral Q6H PRN Michael Boston, MD      . protein supplement (PREMIER PROTEIN) liquid  8 oz Oral BID BM Robbie Lis, MD   8 oz at 06/28/15 1358  . sodium chloride 0.9 % bolus 2,000 mL  2,000 mL Intravenous Once Michael Boston, MD      . sodium chloride flush (NS) 0.9 % injection 10-40 mL  10-40 mL Intracatheter Q12H Theodis Blaze, MD   20 mL at 06/29/15  1018  . traMADol (ULTRAM) tablet 50-100 mg  50-100 mg Oral Q6H PRN Michael Boston, MD   100 mg at 06/29/15 0214    Allergies as of 06/21/2015  . (No Known Allergies)    Family History  Problem Relation Age of Onset  . Colon cancer Father   . Heart disease Mother   . Heart disease Sister   . Diabetes Sister     Social History   Social History  . Marital Status: Married    Spouse Name: N/A  . Number of Children: 2  . Years of Education: N/A   Occupational  History  . retired    Social History Main Topics  . Smoking status: Former Smoker -- 0.00 packs/day    Types: Cigarettes    Quit date: 06/09/1977  . Smokeless tobacco: Never Used  . Alcohol Use: 0.0 oz/week    0 Standard drinks or equivalent per week     Comment: rare  . Drug Use: No     Comment: past marijuana  . Sexual Activity: Not on file   Other Topics Concern  . Not on file   Social History Narrative   Husband civil Chief Financial Officer [place]   Has #2 children (one son at home with her)   Prior employment-various customer service positions    Review of Systems: Ten point ROS is O/W negative except as mentioned in HPI.  Physical Exam: Vital signs in last 24 hours: Temp:  [98.4 F (36.9 C)-98.8 F (37.1 C)] 98.4 F (36.9 C) (02/28 0428) Pulse Rate:  [106-118] 118 (02/28 0428) Resp:  [18-20] 20 (02/28 0428) BP: (109-115)/(63-66) 109/66 mmHg (02/28 0428) SpO2:  [98 %-100 %] 98 % (02/28 0428) Last BM Date: 06/28/15 General:  Alert, Well-developed, well-nourished, pleasant and cooperative in NAD Head:  Normocephalic and atraumatic. Eyes:  Sclera clear, no icterus.  Conjunctiva pink. Ears:  Normal auditory acuity. Mouth:  No deformity or lesions.   Lungs:  Clear throughout to auscultation.  No wheezes, crackles, or rhonchi.  Heart:  Tachy.  No murmurs noted. Abdomen:  Soft, non-distended.  Multiple scars and recent incision sites noted on abdomen and ileostomy on right side of abdomen noted.  BS present, but quiet/hypoactive.  Non-tender.  Rectal:  Deferred.  Msk:  Symmetrical without gross deformities. Pulses:  Normal pulses noted. Extremities:  Without clubbing or edema. Neurologic:  Alert and oriented x 4;  grossly normal neurologically. Skin:  Intact without significant lesions or rashes. Psych:  Alert and cooperative. Normal mood and affect.  Intake/Output from previous day: 02/27 0701 - 02/28 0700 In: -  Out: [place] [Urine:850; Emesis/NG output:350;  Stool:577] Intake/Output this shift: Total I/O In: -  Out: 50 [Stool:50]  Lab Results:  Recent Labs  06/28/15 0649  WBC 6.8  HGB 8.0*  HCT 24.4*  PLT 389   BMET  Recent Labs  06/27/15 0448 06/28/15 0649 06/29/15 0411  NA 138 137 138  K 2.9* 4.1 4.3  CL 100* 103 107  CO2 27 25 22   GLUCOSE 115* 116* 143*  BUN 20 16 19   CREATININE 1.34* 1.06* 1.14*  CALCIUM 8.8* 8.8* 9.1   LFT  Recent Labs  06/29/15 0411  PROT 7.4  ALBUMIN 2.4*  AST 99*  ALT 97*  ALKPHOS 233*  BILITOT 0.6   Studies/Results: Dg Abd Acute W/chest  06/28/2015  CLINICAL DATA:  Patient with abdominal pain and left-sided chest pain for multiple weeks. EXAM: DG ABDOMEN ACUTE W/ 1V CHEST COMPARISON:  Chest radiograph 06/21/2015.  Abdomen CT 06/21/2015. FINDINGS: Right upper extremity PICC line tip projects over the superior vena cava. Stable cardiac and mediastinal contours. New small left pleural effusion with underlying opacities. No pneumothorax. Multiple prominent and gaseous distended loops of small bowel are demonstrated within the central abdomen. No distal colonic gas is present. Cholecystectomy clips. Right hip arthroplasty. Lumbar spine degenerative changes. Gas throughout the soft tissues overlying the lower abdomen. Right lower quadrant mesh demonstrated. IMPRESSION: Gaseous distended loops of small bowel within the central abdomen without distal colonic gas concerning for small bowel obstruction. Small left pleural effusion with underlying opacities favored to represent atelectasis. Gas within the soft tissues overlying the abdomen. Electronically Signed   By: Lovey Newcomer M.D.   On: 06/28/2015 16:58   IMPRESSION:  -70 year old female with recent surgery for GIST tumor of the rectum on 2/3 who presented with nausea and vomiting, dehydration with AKI, high ostomy output.  CT scan/x-rays (last imaging just yesterday 2/27) suggesting ileus vs PSBO.  GES shows severe gastroparesis.  Likely does not have  complete obstruction since she is having ostomy output, which has normalized/decreased.  Likely has slow gut motility related to surgery, pain meds, decreased mobility, etc. -Elevated LFT's:  New since admission.  ? Related to initiation of TNA. -Rectal mucus/discharge/blood:  Likely normal/expected after recent surgery.  I believe that this has been addressed by Dr. Johney Maine. -Anemia:  Likely related to recent surgery, some rectal bleeding, etc.  Hgb low but stable.  PLAN: -Will change Reglan to 10 mg IV ACHS for now and zofran scheduled/RTC 4 mg every 6 hours for now. -Continue empiric PPI. -Needs to be up and moving around/walking. -Minimize narcotic use. -Trend LFT's. -Keep potassium above 4 (normal now but was quite low a couple of days ago).  ZEHR, JESSICA D.  06/29/2015, 10:57 AM  Pager number [phone]  GI ATTENDING  History, laboratories, x-rays, colonoscopy report, pathology reports reviewed. Patient personally seen and examined. Sister in room. Agree with extensive consultation note as outlined above. The patient is 3-1/2 weeks postop for extensive surgery to resect large distal rectal GIST tumor. Initial hospitalization 2 weeks. Re-presented 3 days later with nausea, vomiting, ostomy output, and profound dehydration. We are asked to see for ongoing difficulties with intermittent vomiting. The patient has had radiology studies suggesting either ileus or partial bowel obstruction. Abnormal gastric emptying scan can be consistent with either diagnosis. She has been using narcotics for side pain. Looks pretty good this afternoon with nondistended abdomen and decreased but definite bowel sounds. No tenderness. Having normal looking ostomy output. Has been on TNA for a few days. Interval mild increase in LFTs noted. Recommendations as above, for probable dysmotility, to include elimination of narcotics, changing promotility agent to IV running, and increasing activity. She is agreeable.  Aggressively keep electrolytes normal. Monitor LFTs which are likely abnormal reactively. If the patient were to continue to have ongoing significant symptoms that are unresponsive to these measures then small bowel imaging to absolutely rule out partial obstruction would be indicated. However, wouid go with medical measures this point and follow her. Will follow with you. Thank you.  Docia Chuck. Geri Seminole., M.D. [facility] Division of Gastroenterology

## 2015-06-30 DIAGNOSIS — Z932 Ileostomy status: Secondary | ICD-10-CM

## 2015-06-30 DIAGNOSIS — R198 Other specified symptoms and signs involving the digestive system and abdomen: Secondary | ICD-10-CM

## 2015-06-30 DIAGNOSIS — R112 Nausea with vomiting, unspecified: Secondary | ICD-10-CM

## 2015-06-30 LAB — MAGNESIUM: MAGNESIUM: 1.7 mg/dL (ref 1.7–2.4)

## 2015-06-30 LAB — COMPREHENSIVE METABOLIC PANEL
ALT: 87 U/L — ABNORMAL HIGH (ref 14–54)
ANION GAP: 7 (ref 5–15)
AST: 64 U/L — ABNORMAL HIGH (ref 15–41)
Albumin: 2 g/dL — ABNORMAL LOW (ref 3.5–5.0)
Alkaline Phosphatase: 206 U/L — ABNORMAL HIGH (ref 38–126)
BUN: 23 mg/dL — ABNORMAL HIGH (ref 6–20)
CHLORIDE: 104 mmol/L (ref 101–111)
CO2: 24 mmol/L (ref 22–32)
Calcium: 8.4 mg/dL — ABNORMAL LOW (ref 8.9–10.3)
Creatinine, Ser: 1.13 mg/dL — ABNORMAL HIGH (ref 0.44–1.00)
GFR, EST AFRICAN AMERICAN: 57 mL/min — AB (ref >60)
GFR, EST NON AFRICAN AMERICAN: 49 mL/min — AB (ref >60)
Glucose, Bld: 123 mg/dL — ABNORMAL HIGH (ref 65–99)
POTASSIUM: 4.4 mmol/L (ref 3.5–5.1)
SODIUM: 135 mmol/L (ref 135–145)
Total Bilirubin: 0.6 mg/dL (ref 0.3–1.2)
Total Protein: 6.3 g/dL — ABNORMAL LOW (ref 6.5–8.1)

## 2015-06-30 LAB — PHOSPHORUS: PHOSPHORUS: 4.2 mg/dL (ref 2.5–4.6)

## 2015-06-30 LAB — GLUCOSE, CAPILLARY
GLUCOSE-CAPILLARY: 111 mg/dL — AB (ref 65–99)
GLUCOSE-CAPILLARY: 116 mg/dL — AB (ref 65–99)
GLUCOSE-CAPILLARY: 125 mg/dL — AB (ref 65–99)
Glucose-Capillary: 121 mg/dL — ABNORMAL HIGH (ref 65–99)

## 2015-06-30 MED ORDER — TRACE MINERALS CR-CU-MN-SE-ZN 10-1000-500-60 MCG/ML IV SOLN
INTRAVENOUS | Status: AC
  Administered 2015-06-30: 17:00:00 via INTRAVENOUS
  Filled 2015-06-30: qty 1000
  Filled 2015-06-30: qty 2520

## 2015-06-30 MED ORDER — MAGNESIUM SULFATE IN D5W 10-5 MG/ML-% IV SOLN
1.0000 g | Freq: Once | INTRAVENOUS | Status: AC
  Administered 2015-06-30: 1 g via INTRAVENOUS
  Filled 2015-06-30: qty 100

## 2015-06-30 MED ORDER — FAT EMULSION 20 % IV EMUL
240.0000 mL | INTRAVENOUS | Status: AC
  Administered 2015-06-30: 240 mL via INTRAVENOUS
  Filled 2015-06-30: qty 250

## 2015-06-30 MED ORDER — LORAZEPAM 2 MG/ML IJ SOLN
0.5000 mg | Freq: Two times a day (BID) | INTRAMUSCULAR | Status: DC | PRN
  Administered 2015-07-04 – 2015-07-05 (×2): 1 mg via INTRAVENOUS
  Filled 2015-06-30 (×3): qty 1

## 2015-06-30 NOTE — Progress Notes (Addendum)
[facility] Progress Note  Subjective:  Ostomy output was only 175 mL in last 24 hour period.  Feeling better today.  No vomiting since yesterday AM, tolerating liquids.  Was just up sitting in chair.  Objective:  Vital signs in last 24 hours: Temp:  [98.8 F (37.1 C)-99.5 F (37.5 C)] 98.8 F (37.1 C) (03/01 0500) Pulse Rate:  [88-97] 88 (03/01 0500) Resp:  [18] 18 (03/01 0500) BP: (98-128)/(51-58) 99/55 mmHg (03/01 0600) SpO2:  [100 %] 100 % (03/01 0500) Last BM Date: 06/29/15 General:  Alert, Well-developed, in NAD Heart:  Regular rate and rhythm; no murmurs Pulm:  CTAB.  No W/R/R. Abdomen:  Soft, non-distended. Multiple scars noted on abdomen and ileostomy on right side noted.  BS present but still somewhat quiet/hypoactive.  Non-tender. Extremities:  Without edema. Neurologic:  Alert and  oriented x 4;  grossly normal neurologically. Psych:  Alert and cooperative. Normal mood and affect.  Intake/Output from previous day: 02/28 0701 - 03/01 0700 In: 130 [P.O.:120; I.V.:10] Out: 175 [Stool:175] Intake/Output this shift: Total I/O In: -  Out: 325 [Urine:200; Stool:125]  Lab Results:  Recent Labs  06/28/15 0649  WBC 6.8  HGB 8.0*  HCT 24.4*  PLT 389   BMET  Recent Labs  06/28/15 0649 06/29/15 0411 06/30/15 0412  NA 137 138 135  K 4.1 4.3 4.4  CL 103 107 104  CO2 25 22 24   GLUCOSE 116* 143* 123*  BUN 16 19 23*  CREATININE 1.06* 1.14* 1.13*  CALCIUM 8.8* 9.1 8.4*   LFT  Recent Labs  06/30/15 0412  PROT 6.3*  ALBUMIN 2.0*  AST 64*  ALT 87*  ALKPHOS 206*  BILITOT 0.6   Dg Abd Acute W/chest  06/28/2015  CLINICAL DATA:  Patient with abdominal pain and left-sided chest pain for multiple weeks. EXAM: DG ABDOMEN ACUTE W/ 1V CHEST COMPARISON:  Chest radiograph 06/21/2015.  Abdomen CT 06/21/2015. FINDINGS: Right upper extremity PICC line tip projects over the superior vena cava. Stable cardiac and mediastinal contours. New small left  pleural effusion with underlying opacities. No pneumothorax. Multiple prominent and gaseous distended loops of small bowel are demonstrated within the central abdomen. No distal colonic gas is present. Cholecystectomy clips. Right hip arthroplasty. Lumbar spine degenerative changes. Gas throughout the soft tissues overlying the lower abdomen. Right lower quadrant mesh demonstrated. IMPRESSION: Gaseous distended loops of small bowel within the central abdomen without distal colonic gas concerning for small bowel obstruction. Small left pleural effusion with underlying opacities favored to represent atelectasis. Gas within the soft tissues overlying the abdomen. Electronically Signed   By: Lovey Newcomer M.D.   On: 06/28/2015 16:58   Assessment / Plan: *70 year old female with recent surgery for GIST tumor of the rectum on 2/3 who presented with nausea and vomiting, dehydration with AKI, high ostomy output. CT scan/x-rays (last imaging 2/27) suggesting ileus vs PSBO. GES shows severe gastroparesis, which can be expected with ileus and PSBO. Likely does not have complete obstruction since she is having ostomy output, which has normalized/decreased but actually quite low in last 24 hour period. Likely has slow gut motility related to surgery, pain meds, decreased mobility, etc.  Feeling better today. *Elevated LFT's: New since admission. ? Related to initiation of TNA/reactive.  Down slightly today. *Rectal mucus/discharge/blood: Likely normal/expected after recent surgery. I believe that this has been addressed by Dr. Johney Maine. *Anemia: Likely related to recent surgery, some rectal bleeding, etc. Hgb low but stable, but  rechecked since 2/27.  -Reglan changed to 10 mg IV ACHS and zofran scheduled/RTC 4 mg every 6 hours on 2/28. -Continue empiric PPI. -Needs to be up and moving around/walking. -Minimize narcotic use. -Trend LFT's, monitor Hgb. -Keep potassium above 4 (normal now but was quite low a  couple of days ago). -If symptoms worsen again then would consider small bowel imaging (SBFT) to absolutely rule out partial obstruction, etc.   LOS: 9 days   ZEHR, JESSICA D.  06/30/2015, 10:09 AM  Pager number [phone]  GI ATTENDING  Interval history data reviewed. Patient seen and examined. Agree with interval progress note. Doing better over the past 24 hours. Hopefully this can continue. Continue current treatment plan.  Docia Chuck. Geri Seminole., M.D. [facility] Division of Gastroenterology

## 2015-06-30 NOTE — Progress Notes (Signed)
PARENTERAL NUTRITION CONSULT NOTE - FOLLOW UP  Pharmacy Consult for TPN Indication: intolerance to tube feeds, post-op ileus vs pSBO  No Known Allergies  Patient Measurements: Height: _0  (160 cm) Weight: 202 lb 13.2 oz (92 kg) IBW/kg (Calculated) : 52.4 Adjusted Body Weight: 68kg Usual Weight: 102kg (lost 24lb in last month)  Vital Signs: Temp: 98.8 F (37.1 C) (03/01 0500) Temp Source: Oral (03/01 0500) BP: 99/55 mmHg (03/01 0600) Pulse Rate: 88 (03/01 0500) Intake/Output from previous day: 02/28 0701 - 03/01 0700 In: 130 [P.O.:120; I.V.:10] Out: 175 [Stool:175] Intake/Output from this shift:    Labs:  Recent Labs  06/28/15 0649  WBC 6.8  HGB 8.0*  HCT 24.4*  PLT 389     Recent Labs  06/28/15 0649 06/29/15 0411 06/30/15 0412  NA 137 138 135  K 4.1 4.3 4.4  CL 103 107 104  CO2 _1 GLUCOSE 116* 143* 123*  BUN 16 19 23*  CREATININE 1.06* 1.14* 1.13*  CALCIUM 8.8* 9.1 8.4*  MG 1.6* 2.1 1.7  PHOS 3.2 3.2 4.2  PROT 6.8 7.4 6.3*  ALBUMIN 2.3* 2.4* 2.0*  AST 53* 99* 64*  ALT 74* 97* 87*  ALKPHOS 198* 233* 206*  BILITOT 0.8 0.6 0.6  PREALBUMIN 9.6*  --   --   TRIG 180*  --   --    Estimated Creatinine Clearance: 52 mL/min (by C-G formula based on Cr of 1.13).    Recent Labs  06/29/15 1854 06/29/15 2349 06/30/15 0612  GLUCAP 113* 122* 111*    Insulin Requirements:  Moderate SSI - 3 units Novolog  Current Nutrition: on megace Dysphagia 1 diet, no meal intake documented  IVF: none  Central access: PICC 2/7 TPN start date:  2/24  ASSESSMENT                                                                                                    HPI: [name] s/p laparoscopic and robotic resection of GIST TUMOR lysis of adhesions on 2/3. She was discharged on 2/17, but returned 2/20 with nausea vomiting, high ileostomy output, poor appetite and subjective fevers. She continues with poor oral intake and nausea/vomiting. Also positive for skin  breakdown at ostomy site as a result of malnutrition. No evidence of obstruction per surgery. Pharmacy consulted to start parenteral nutrition.   Significant events:  2/25: gastric emptying study - revealed significantly delayed gastric emptying.  5109m green bilious vomitus noted per RN. On Reglan. 2/27 Abdominal with chest xray performed still suggesting ileus vs pSBO.  Today, 06/30/2015:    Glucose - at goal < 150 mg/dl  Electrolytes - K (on daily 40 mEq PO KCl), Phos WNL, Mag WNL but at the low end of normal, Corr Ca 10.0  Renal - SCr 1.13, has been keeping slightly up likely d/t vomiting episodes.   I/O data incomplete.  Ostomy output decreasing.  LFTs - Alk phos, AST/ALT elevated but improved slightly today, could possibly be d/t TPN. May need to consider cycling the TPN. Normal Tbili, pt does not have gallbladder.  TGs - 119 (2/25), 180 (2/27)  Prealbumin - 7.6 (2/25), 9.6 (2/27)  Pt continues to have poor appetite, continued N/V. On Megace, Reglan IV.  Will continue TPN until intake improves.  NUTRITIONAL GOALS                                                                                             RD recs: 2200-2400 kcal, 120-130 gm protein Clinimix 5/15 at a goal rate of 115m/hr + 20% fat emulsion at 133mhr to provide: 126 g/day protein, 2269Kcal/day.  PLAN                                                                                                                         This morning:  Magnesium sulfate 1gm IV x 1.  At 1800 today:  Patient has been tolerating slow advancement of Clinimix E 5/15, increase to goal rate of 105 ml/hr tonight.  20% fat emulsion at 1086mr.  TPN to contain standard multivitamins and trace elements.  Continue mod SSI as ordered q6h  TPN lab panels on Mondays & Thursdays.   F/u daily.  AmaHershal CoriaharmD, BCPS Pager: [phone] 7:07 AM

## 2015-06-30 NOTE — Progress Notes (Addendum)
Patient ID: Brianna Walker, female   DOB: 1946/02/07, 70 y.o.   MRN: [id number] [facility] PROGRESS NOTE  Brianna Walker [id number] DOB: December 01, 1945 DOA: 06/21/2015 PCP: Cathlean Cower, MD  Brief narrative:    70 year old female status post laparoscopic and robotic resection of GIST TUMOR with lysis of adhesions on 2/3, discharged on 2/17. Pt presented to [facility] ED for evaluation of nausea vomiting, high ileostomy output, poor appetite and subjective fevers. General surgery consulted due to recent surgery. CT abdomen pelvis showed postsurgical changes, postsurgical ileus vs partial small bowel obstruction, small hiatal hernia. Chest x-ray was negative. Creatinine has increased from 0.98 to 5.91.  Hospital course is complicated with delayed gastric emptying which was noted on the study done 06/26/2015. Patient is on TPN for nutritional support. In addition, she has ongoing bloody mucus diarrhea and we consulted GI 06/29/2015.   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   DVT Prophylaxis  - Continue apixaban   Code Status: Full.  Family Communication:  plan of care discussed with the patient Disposition Plan: by 3/3 if her GI symptoms better.   IV access:  Peripheral IV  Procedures and diagnostic studies:     Dg Abd Acute W/chest 06/28/2015  Gaseous distended loops of small bowel within the central abdomen without distal colonic gas concerning for small bowel obstruction. Small left pleural effusion with underlying opacities favored to represent atelectasis. Gas within the soft tissues overlying the abdomen.   Nm Gastric Emptying 06/26/2015  Significantly delayed  gastric emptying study.   Ct Abdomen Pelvis Wo Contrast 06/21/2015 1. Stable postsurgical changes, as described above. 2. Mildly improved postsurgical jejunal ileus or partial obstruction, with transition in the left mid abdomen. This could be an indication of an adhesion. 3. Small hiatal hernia. 4. Colonic diverticulosis.   Dg Chest Portable 1 View 06/21/2015 Stable. No acute findings.   Dg Chest Port 1 View 06/21/2015 No acute cardiopulmonary disease.   Medical Consultants:  Surgery GI  IAnti-Infectives:   None    Leisa Lenz, MD  [facility] Pager [phone]  Time spent in minutes: 15 minutes  If 7PM-7AM, please contact night-coverage [email or web address] Password [email or web address] 06/30/2015, 5:52 PM   LOS: 9 days    HPI/Subjective: No acute overnight events. Patient reports she feels better.  Objective: Filed Vitals:   06/29/15 2117 06/30/15 0500 06/30/15 0600 06/30/15 1353  BP:  128/58 98/51 99/55  134/66  Pulse: 97 88  102  Temp: 99.5 F (37.5 C) 98.8 F (37.1 C)  98.9 F (37.2 C)  TempSrc: Oral Oral  Oral  Resp: 18 18  18   Height:      Weight:      SpO2: 100% 100%  99%    Intake/Output  Summary (Last 24 hours) at 06/30/15 1752 Last data filed at 06/30/15 1651  Gross per 24 hour  Intake 5129.33 ml  Output   1275 ml  Net 3854.33 ml    Exam:   General:  Pt is alert, no acute distress   Cardiovascular: RRR, (+) S1, S2   Respiratory: bilateral air entry, no wheezing   Abdomen: RUQ ileostomy (+), non tender   Extremities: No edema, palpable pulses   Neuro: Nonfocal  Data Reviewed: Basic Metabolic Panel:  Recent Labs Lab 06/26/15 0441 06/27/15 0448 06/28/15 0649 06/29/15 0411 06/30/15 0412  NA 136 138 137 138 135  K 2.9* 2.9* 4.1 4.3 4.4  CL 99* 100* 103 107 104  CO2 27 27 25 22 24   GLUCOSE 145* 115* 116* 143* 123*  BUN 14 20 16 19  23*  CREATININE 1.21* 1.34* 1.06* 1.14* 1.13*  CALCIUM 8.4* 8.8* 8.8* 9.1 8.4*  MG 2.0 1.8 1.6* 2.1 1.7  PHOS 2.3* 3.3 3.2 3.2 4.2   Liver Function Tests:  Recent Labs Lab 06/26/15 0441 06/28/15 0649 06/29/15 0411 06/30/15 0412  AST 39 53* 99* 64*  ALT 42 74* 97* 87*  ALKPHOS 177* 198* 233* 206*  BILITOT 1.2 0.8 0.6 0.6  PROT 6.7 6.8 7.4 6.3*  ALBUMIN 2.4* 2.3* 2.4* 2.0*   No results for input(s): LIPASE, AMYLASE in the last 168 hours. No results for input(s): AMMONIA in the last 168 hours. CBC:  Recent Labs Lab 06/24/15 0900 06/25/15 0648 06/26/15 0441 06/28/15 0649  WBC 7.9 9.0 8.1 6.8  NEUTROABS  --   --  6.4 4.8  HGB 7.8* 8.3* 8.0* 8.0*  HCT 24.1* 25.3* 24.5* 24.4*  MCV 84.9 83.8 83.3 84.1  PLT 324 354 325 389   Cardiac Enzymes: No results for input(s): CKTOTAL, CKMB, CKMBINDEX, TROPONINI in the last 168 hours. BNP: Invalid input(s): POCBNP CBG:  Recent Labs Lab 06/29/15 1213 06/29/15 1854 06/29/15 2349 06/30/15 0612 06/30/15 1202  GLUCAP 119* 113* 122* 111* 116*    Urine culture     Status: None   Collection Time: 06/21/15  1:16 PM  Result Value Ref Range Status   Specimen Description URINE, CATHETERIZED  Final    NO GROWTH 1 DAY   Report Status 06/22/2015 FINAL  Final  C  difficile quick scan w PCR reflex     Status: None   Collection Time: 06/21/15  7:21 PM  Result Value Ref Range Status   C Diff antigen NEGATIVE NEGATIVE Final   C Diff toxin NEGATIVE NEGATIVE Final   C Diff interpretation Negative for toxigenic C. difficile  Final  Gastrointestinal Panel by PCR , Stool     Status: None   Collection Time: 06/21/15  7:21 PM  Result Value Ref Range Status   Campylobacter species NOT DETECTED NOT DETECTED Final   Plesimonas shigelloides NOT DETECTED NOT DETECTED Final   Salmonella species NOT DETECTED NOT DETECTED Final   Yersinia enterocolitica NOT DETECTED NOT DETECTED Final   Vibrio species NOT DETECTED NOT DETECTED Final   Vibrio cholerae NOT DETECTED NOT DETECTED Final   Enteroaggregative E coli (EAEC) NOT DETECTED NOT DETECTED Final  Enteropathogenic E coli (EPEC) NOT DETECTED NOT DETECTED Final   Enterotoxigenic E coli (ETEC) NOT DETECTED NOT DETECTED Final   Shiga like toxin producing E coli (STEC) NOT DETECTED NOT DETECTED Final   E. coli O157 NOT DETECTED NOT DETECTED Final   Shigella/Enteroinvasive E coli (EIEC) NOT DETECTED NOT DETECTED Final   Cryptosporidium NOT DETECTED NOT DETECTED Final   Cyclospora cayetanensis NOT DETECTED NOT DETECTED Final   Entamoeba histolytica NOT DETECTED NOT DETECTED Final   Giardia lamblia NOT DETECTED NOT DETECTED Final   Adenovirus F40/41 NOT DETECTED NOT DETECTED Final   Astrovirus NOT DETECTED NOT DETECTED Final   Norovirus GI/GII NOT DETECTED NOT DETECTED Final   Rotavirus A NOT DETECTED NOT DETECTED Final   Sapovirus (I, II, IV, and V) NOT DETECTED NOT DETECTED Final    . apixaban  5 mg Oral BID  . aspirin  81 mg Oral QHS  . estradiol  0.05 mg Transdermal Weekly  . insulin aspart  0-15 Units Subcutaneous 4 times per day  . lactated ringers  2,000 mL Intravenous Once per day on Mon Wed Fri  . lip balm  1 application Topical BID  . megestrol  400 mg Oral Daily  . metoCLOPramide (REGLAN) injection  10  mg Intravenous TID AC & HS  . ondansetron  4 mg Intravenous 4 times per day  . pantoprazole  80 mg Oral BID  . protein supplement shake  8 oz Oral BID BM  . sodium chloride  2,000 mL Intravenous Once  . sodium chloride flush  10-40 mL Intracatheter Q12H     Continuous Infusions: . Marland KitchenTPN (CLINIMIX-E) Adult Stopped (06/30/15 1707)   And  . fat emulsion Stopped (06/30/15 1707)  . Marland KitchenTPN (CLINIMIX-E) Adult 105 mL/hr at 06/30/15 1708   And  . fat emulsion 240 mL (06/30/15 1707)

## 2015-06-30 NOTE — Progress Notes (Signed)
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     PT Comments    Pt feeling better today and agreeable to ambulate in hallway with rollator.  Cues for technique for safety with mobility.  Plan is SNF now pt states ( possible TPN?) after MD spoke with pt.  Follow Up Recommendations  SNF     Equipment Recommendations  None recommended by PT    Recommendations for Other Services       Precautions / Restrictions Precautions Precautions: Fall Precaution Comments: ileostomy Restrictions Weight Bearing Restrictions: No    Mobility  Bed Mobility Overal bed mobility: Modified Independent       Supine to sit: Modified independent (Device/Increase time);HOB elevated     General bed mobility comments: HOB elevated and with rails  Transfers Overall transfer level: Needs assistance Equipment used: 4-wheeled walker Transfers: Sit to/from Stand Sit to Stand: Min guard         General transfer comment: cues for hand placement  Ambulation/Gait Ambulation/Gait assistance: Supervision;Min guard Ambulation Distance (Feet): 120 Feet (x2) Assistive device: 4-wheeled walker Gait Pattern/deviations: Decreased stride length;Trunk flexed Gait velocity: decreased   General Gait Details: slow steady [employer] training [employer] education [employer] Rankin (Stroke Patients Only)       Balance                                    Cognition Arousal/Alertness: Awake/alert Behavior During Therapy: WFL for tasks assessed/performed Overall Cognitive Status: Within  Functional Limits for tasks assessed                      Exercises      General Comments General comments (skin integrity, edema, etc.): MD into see pt during session and states pt plan is to go SNF      Pertinent Vitals/Pain Pain Assessment: No/denies pain    Home Living                      Prior Function            PT Goals (current goals can now be found in the care plan section) Acute Rehab PT Goals Patient Stated Goal: rehab then home PT Goal Formulation: With patient Time For Goal Achievement: 07/06/15 Potential to Achieve Goals: Good Progress towards PT goals: Progressing toward goals    Frequency  Min 3X/week    PT Plan Discharge plan needs to be updated    Co-evaluation             End of Session   Activity Tolerance: Patient tolerated treatment well Patient left: in chair;with call bell/phone within reach     Time: 0851-0909 PT Time Calculation (min) (ACUTE ONLY): 18 min  Charges:  $Gait Training: 8-22 mins                    G Codes:      Billyjoe Go LUBECK 06/30/2015, 11:17 AM

## 2015-06-30 NOTE — Progress Notes (Signed)
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 Resection of cystic mass near small bowel with serosal repair.  Surgeon(s): Michael Boston, MD Leighton Ruff, MD - Assist   Assessment  RECURRENT N/V with delayed gastric emptying - improved   Plan:  -DGE/gastroparesis.  Zofran & Reglan IV RTC.  GI consult help appreciated.  Hold iron for possible ileus  -mild ileostomy stenosis but no stricture with good output & 18Fr NGT easily intubates - doubt cause of n/v.  AXR w SB gaseous distention c/w ileus.  No colonic air because colon diverted = doubt obstruction/SBO  ARF/dehydration - nearly resolved but poor PO intake.  Some orthostasis by HR but not BP.   VS.  2L IVF q MWF  -Poor PO with intermittent n/v & no evidence of obstruction.  Better.  Adv to solids.  Check calorie ciounts.  If eating better, try to switch to PO reglan/zofran & wean off TNA. Trying Megace to stimulate appetite.    -TNA until eating better.  If not improved in a few days, SNF with TNA until surgery  -Skin separation at pink  ileostomy with skin necrosis - ostomy care.  Reflection of malnutrition. OSTOMY CARE / TRAINING.  Complex packing/pouching working so far.  Large wound but granulating. >3 weeks postop = would treat as EC fistula & pouch & try to get nutrition better.  I worry attempt at ostomy revision would be even worse.  Can try to take ileostomy down if > 6 weeks & Albumin >3 to minimize risks of leak/breakdown  -replace lytes PRN.  Low Mag replaced  -antidiarrheal regimen as tolerated once tolerating PO in order to avoid dehydration.  Holding given gastroparesis.  Challenging with her nausea issues.    -VTE prophylaxis- SCDs, etc  -GERD - PPI  -hyperlipidemia - Tx  -HTN  - No ACE Inh  -h/o PE - Eliquis as tolerated.    -mobilize as tolerated to help recovery.  GET HER UP!  -Pathology - ?+margin most likely fracture of GIST exposed.  Most likely will get gleevec post-adjuvant for 3 years total per Dr Benay Spice.   D/C patient from hospital when patient meets criteria (anticipate in ???? day(s)):  Tolerating oral intake well Controlling diarrhea Ambulating well Adequate pain control without IV medications Urinating   Having flatus  Disposition planning in place - Probably SNF w ostomy care.  Increase HH IVF via PICC qMWF to 2L (instead of 1L qMWF)  Ostomy care/teaching    Adin [name], M.D., F.A.C.S. Gastrointestinal and Minimally Invasive Surgery [facility] [address], [place], [place] [address] [phone] Main / Paging   06/30/2015  Subjective:  "I feel the best yet" Stays in bed.  Claims to be walking well No anorectal pain.  Scant drainage No pain at ileostomy  Appetite better    Objective:  Vital signs:  Filed Vitals:   06/29/15 2117 06/30/15 0500 06/30/15 0600 06/30/15 1353  BP: 1_0 134/66  Pulse: 97 88  102  Temp: 99.5 F (37.5 C) 98.8 F (37.1 C)  98.9 F (37.2 C)  TempSrc: Oral Oral  Oral  Resp: _1 Height:      Weight:      SpO2: 100% 100%  99%    Last BM Date: 06/29/15  Intake/Output   Yesterday:  02/28 0701 - 03/01 0700 In: 130 [P.O.:120; I.V.:10] Out: 175 [Stool:175] This shift:  Total I/O In: 5119.3 [TPN:5119.3] Out: 775 [Urine:550; Stool:225]  Bowel function:  Flatus: YES ++  BM: thin effluent in bag.   Drain: n/a  Physical Exam:  General: Pt awake/alert/oriented x4 in no acute distress.  Sitting in bed.  Calm.  Smiling, alert Eyes: PERRL, normal EOM.  Sclera clear.  No icterus Neuro: CN II-XII intact w/o focal sensory/motor deficits. Lymph: No head/neck/groin lymphadenopathy Psych:  No delerium/psychosis/paranoia HENT: Normocephalic, Mucus membranes moist.  No thrush Neck: Supple, No tracheal deviation Chest: No chest wall pain w good excursion CV:  Pulses intact.  Regular rhythm MS: Normal AROM mjr joints.  No obvious deformity  Abdomen: Soft but morbidly obese.  Nondistended.  Nontender.  No fluctuance/cellultis.  No guarding.  No evidence of peritonitis.  No incarcerated hernias.  Loop ileostomy pink with mild stenosis, moderate thick effluent out superior orifice.  No purulence/cellulitis.  No ostomy necrosis.    Rectal deferred today Ext:  SCDs BLE.  No mjr edema.  No cyanosis Skin: No petechiae / purpura  Results:   Diagnosis 1. Soft tissue, biopsy, abdominal wall cyst ? fistula BENIGN CYST AND REACTIVE CHANGES NEGATIVE FOR MALIGNANCY 2. Soft tissue mass, simple excision, Pelvic GIST GASTROINTESTINAL STROMAL TUMORS OF THE RECTUM SHOWING TREATMENT EFFECT (9.5 CM) MARGIN OF RESECTION IS POSITIVE 3. Colon, segmental resection for tumor, rectum DIVERTICULA NO RESIDUAL GASTROINTESTINAL STROMAL TUMOR IDENTIFIED MARGINS OF RESECTION ARE NEGATIVE FIFTEEN BENIGN LYMPH NODES (0/15) Microscopic Comment 2. GASTROINTESTINAL STROMAL TUMOR (GIST): Procedure: segmental resection Tumor Site: rectum Specify: pelvis rectum Tumor Size: 9.5  cm Tumor Focality Unifocal: focal GIST Subtype (spindle, epithelioid, mixed, etc.): spindle Mitotic Rate: 1 /50 HIGH POWER FIELD Necrosis: negative Histologic Grade 1 G1: Low grade; mitotic rate 5/50 HIGH POWER FIELD. G2: High grade; mitotic rate >5/50 HIGH POWER FIELD. Risk Assessment: _ Margins: positive Ancillary testing: Per request 1 of 3 FINAL for MARLYCE, MCDOUGALD [phone]) Microscopic Comment(continued) Lymph nodes: number examined: 15; number positive: 0 Pathologic Staging: pT3 pN0 Comments: The tumor shows treatment effect with hypocellular, atrophic spindle cells and hyalinized, fibrotic stroma. less than 1% of the tumor without treatment effect. [name] MD Pathologist, Electronic Signature (Case signed 06/10/2015) Specimen Rashun Grattan and Clinical Information Specimen(s) Obtained: 1. Soft tissue, biopsy, abdominal wall cyst ? fistula 2. Soft tissue mass, simple excision, Pelvic GIST 3. Colon, segmental resection for tumor, rectum Specimen Clinical Information 1. GIST tumor of rectum in pelvis (kp) Fariha Goto 1. In formalin is a a 3.1 x 1.4 x 0.3 cm irregular portion of fatty tissue, which on sectioning contains a 0.5 cm collapsed smooth cystic space. Sectioned and entirely submitted in one block. 2. Received in formalin is a 176 gram, 9.5 x 6.8 x 5.8  cm rubbery to firm mass, which has an attached suture over a 2.1 x 1.5 cm roughened area, clinically identifying anterior distal. This area is inked orange. There is also a 5.0 x 4.2 cm disrupted area, which is clinically identified as disrupted margin. This area is inked black. Also along one aspect is a 3.4 x 3.3 cm area of tan to hyperemic possible smooth mucosa. The cut surfaces of the mass are tan to tan yellow, with vaguely whorled cut surfaces, and scattered myxoid areas. Representative sections are submitted as follows: A, B = sections from clinically disrupted margin C = sections from area identified by suture as  anterior distal D, E = central sections Total: 5 blocks submitted 3. Received in formalin is a 27 cm in length segment of colon, clinically recto-sigmoid, which includes at least probable middle third of rectum. Surrounding mesorectum is incomplete and focally disrupted. There is an attached suture over the anterior perirectal surface. The mucosa is tan pink, smooth, soft with normal intestinal folds. There are no mucosal lesions identified. Within the sigmoid portion are several unperforated diverticula. Found within the surrounding fat are 15 possible lymph nodes ranging from 0.1 to 0.6 cm. Block Summary: A = proximal margin B, C = distal margin D, E = sections taken at attached suture over anterior mesorectum F = diverticulum G = four nodes, whole H = four nodes, whole I = four nodes, whole J = three nodes, whole Total: 10 blocks submitted (SW:ds 06/07/15) 2 of 3 FINAL for TAUNI, SANKS ([id number]) Report signed out from the following location(s) Technical component and interpretation was performed at [facility], [place], [place] [address]. CLIA #: [id number], 3 of 3   Labs: Results for orders placed or performed during the hospital encounter of 06/21/15 (from the past 48 hour(s))  Glucose, capillary     Status: Abnormal   Collection Time: 06/28/15  5:58 PM  Result Value Ref Range   Glucose-Capillary 132 (H) 65 - 99 mg/dL  Glucose, capillary     Status: Abnormal   Collection Time: 06/29/15 12:02 AM  Result Value Ref Range   Glucose-Capillary 123 (H) 65 - 99 mg/dL  Magnesium     Status: None   Collection Time: 06/29/15  4:11 AM  Result Value Ref Range   Magnesium 2.1 1.7 - 2.4 mg/dL  Comprehensive metabolic panel     Status: Abnormal   Collection Time: 06/29/15  4:11 AM  Result Value Ref Range   Sodium 138 135 - 145 mmol/L   Potassium 4.3 3.5 - 5.1 mmol/L   Chloride 107 101 - 111 mmol/L   CO2 22 22 - 32 mmol/L   Glucose, Bld 143 (H) 65  - 99 mg/dL   BUN 19 6 - 20 mg/dL   Creatinine, Ser 1.14 (H) 0.44 - 1.00 mg/dL   Calcium 9.1 8.9 - 10.3 mg/dL   Total Protein 7.4 6.5 - 8.1 g/dL   Albumin 2.4 (L) 3.5 - 5.0 g/dL   AST 99 (H) 15 - 41 U/L   ALT 97 (H) 14 - 54 U/L   Alkaline Phosphatase 233 (H) 38 - 126 U/L   Total Bilirubin 0.6 0.3 - 1.2 mg/dL   GFR calc non Af Amer 49 (L) >60 mL/min   GFR calc Af Amer 56 (L) >60 mL/min    Comment: (NOTE) The eGFR has been calculated using the CKD EPI equation. This calculation has not been validated in all clinical situations. eGFR's persistently <60  mL/min signify possible Chronic Kidney Disease.    Anion gap 9 5 - 15  Phosphorus     Status: None   Collection Time: 06/29/15  4:11 AM  Result Value Ref Range   Phosphorus 3.2 2.5 - 4.6 mg/dL  Glucose, capillary     Status: Abnormal   Collection Time: 06/29/15  5:54 AM  Result Value Ref Range   Glucose-Capillary 111 (H) 65 - 99 mg/dL  Glucose, capillary     Status: Abnormal   Collection Time: 06/29/15 12:13 PM  Result Value Ref Range   Glucose-Capillary 119 (H) 65 - 99 mg/dL  Glucose, capillary     Status: Abnormal   Collection Time: 06/29/15  6:54 PM  Result Value Ref Range   Glucose-Capillary 113 (H) 65 - 99 mg/dL  Glucose, capillary     Status: Abnormal   Collection Time: 06/29/15 11:49 PM  Result Value Ref Range   Glucose-Capillary 122 (H) 65 - 99 mg/dL  Magnesium     Status: None   Collection Time: 06/30/15  4:12 AM  Result Value Ref Range   Magnesium 1.7 1.7 - 2.4 mg/dL  Comprehensive metabolic panel     Status: Abnormal   Collection Time: 06/30/15  4:12 AM  Result Value Ref Range   Sodium 135 135 - 145 mmol/L   Potassium 4.4 3.5 - 5.1 mmol/L   Chloride 104 101 - 111 mmol/L   CO2 24 22 - 32 mmol/L   Glucose, Bld 123 (H) 65 - 99 mg/dL   BUN 23 (H) 6 - 20 mg/dL   Creatinine, Ser 1.13 (H) 0.44 - 1.00 mg/dL   Calcium 8.4 (L) 8.9 - 10.3 mg/dL   Total Protein 6.3 (L) 6.5 - 8.1 g/dL   Albumin 2.0 (L) 3.5 - 5.0 g/dL    AST 64 (H) 15 - 41 U/L   ALT 87 (H) 14 - 54 U/L   Alkaline Phosphatase 206 (H) 38 - 126 U/L   Total Bilirubin 0.6 0.3 - 1.2 mg/dL   GFR calc non Af Amer 49 (L) >60 mL/min   GFR calc Af Amer 57 (L) >60 mL/min    Comment: (NOTE) The eGFR has been calculated using the CKD EPI equation. This calculation has not been validated in all clinical situations. eGFR's persistently <60 mL/min signify possible Chronic Kidney Disease.    Anion gap 7 5 - 15  Phosphorus     Status: None   Collection Time: 06/30/15  4:12 AM  Result Value Ref Range   Phosphorus 4.2 2.5 - 4.6 mg/dL  Glucose, capillary     Status: Abnormal   Collection Time: 06/30/15  6:12 AM  Result Value Ref Range   Glucose-Capillary 111 (H) 65 - 99 mg/dL  Glucose, capillary     Status: Abnormal   Collection Time: 06/30/15 12:02 PM  Result Value Ref Range   Glucose-Capillary 116 (H) 65 - 99 mg/dL      Imaging / Studies: Dg Abd Acute W/chest  06/28/2015  CLINICAL DATA:  Patient with abdominal pain and left-sided chest pain for multiple weeks. EXAM: DG ABDOMEN ACUTE W/ 1V CHEST COMPARISON:  Chest radiograph 06/21/2015.  Abdomen CT 06/21/2015. FINDINGS: Right upper extremity PICC line tip projects over the superior vena cava. Stable cardiac and mediastinal contours. New small left pleural effusion with underlying opacities. No pneumothorax. Multiple prominent and gaseous distended loops of small bowel are demonstrated within the central abdomen. No distal colonic gas is present. Cholecystectomy clips. Right hip arthroplasty. Lumbar spine  degenerative changes. Gas throughout the soft tissues overlying the lower abdomen. Right lower quadrant mesh demonstrated. IMPRESSION: Gaseous distended loops of small bowel within the central abdomen without distal colonic gas concerning for small bowel obstruction. Small left pleural effusion with underlying opacities favored to represent atelectasis. Gas within the soft tissues overlying the abdomen.  Electronically Signed   By: Lovey Newcomer M.D.   On: 06/28/2015 16:58    Medications / Allergies: per chart  Antibiotics: Anti-infectives    None        Note: Portions of this report may have been transcribed using voice recognition software. Every effort was made to ensure accuracy; however, inadvertent computerized transcription errors may be present.   Any transcriptional errors that result from this process are unintentional.     Adin [name], M.D., F.A.C.S. Gastrointestinal and Minimally Invasive Surgery [facility] [address] [place], [place] [address] [phone] Main / Paging   06/30/2015  CARE TEAM:  PCP: Cathlean Cower, MD  Outpatient Care Team: Patient Care Team: Biagio Borg, MD as PCP - General Michael Boston, MD as Consulting Physician (General Surgery) Ladell Pier, MD as Consulting Physician (Oncology) Milus Banister, MD as Consulting Physician (Gastroenterology) Billy Fischer, MD as Consulting Physician (Family Medicine)  Inpatient Treatment Team: Treatment Team: Attending Provider: Robbie Lis, MD; Consulting Physician: Michael Boston, MD; Rounding Team: Ian Bushman, MD; Technician: Guadalupe Maple, NT; Technician: Sueanne Margarita, NT; Technician: Abbe Amsterdam, NT; Technician: Parks Ranger, [place]; Registered Nurse: Bailey Mech, RN; Consulting Physician: Nolon Nations, MD; Consulting Physician: Irene Shipper, MD; Technician: Tenna Child, [place]; Registered Nurse: Arnold Long, RN; Technician: Coralie Carpen, NT

## 2015-06-30 NOTE — Consult Note (Signed)
WOC ostomy follow up Stoma type/location: RLQ ileostomy with MC separation and full thickness wound Visit to assess pouching system for intact seal.  Pouch is in need of emptying and patient is asleep.  Patient teaching included the need to routinely assess pouch for fullness to avoid over-filling and having the weight of the pouch break the seal. Patint indicates understanding.  Patient is feeling better today.  Planned pouch change tomorrow am. Enrolled patient in [facility] Start Discharge program: Yes [facility] nursing team will follow, and will remain available to this patient, the nursing, surgical and medical teams.   Thanks, Maudie Flakes, MSN, RN, [place], Arther Abbott  Pager# [phone]

## 2015-07-01 DIAGNOSIS — R1110 Vomiting, unspecified: Secondary | ICD-10-CM | POA: Insufficient documentation

## 2015-07-01 LAB — GLUCOSE, CAPILLARY
GLUCOSE-CAPILLARY: 111 mg/dL — AB (ref 65–99)
GLUCOSE-CAPILLARY: 116 mg/dL — AB (ref 65–99)
GLUCOSE-CAPILLARY: 122 mg/dL — AB (ref 65–99)
Glucose-Capillary: 99 mg/dL (ref 65–99)

## 2015-07-01 LAB — COMPREHENSIVE METABOLIC PANEL
ALBUMIN: 2.3 g/dL — AB (ref 3.5–5.0)
ALK PHOS: 244 U/L — AB (ref 38–126)
ALT: 127 U/L — ABNORMAL HIGH (ref 14–54)
ANION GAP: 10 (ref 5–15)
AST: 85 U/L — ABNORMAL HIGH (ref 15–41)
BUN: 28 mg/dL — ABNORMAL HIGH (ref 6–20)
CHLORIDE: 105 mmol/L (ref 101–111)
CO2: 22 mmol/L (ref 22–32)
Calcium: 8.7 mg/dL — ABNORMAL LOW (ref 8.9–10.3)
Creatinine, Ser: 1.18 mg/dL — ABNORMAL HIGH (ref 0.44–1.00)
GFR calc non Af Amer: 47 mL/min — ABNORMAL LOW (ref >60)
GFR, EST AFRICAN AMERICAN: 54 mL/min — AB (ref >60)
GLUCOSE: 131 mg/dL — AB (ref 65–99)
POTASSIUM: 4.2 mmol/L (ref 3.5–5.1)
SODIUM: 137 mmol/L (ref 135–145)
Total Bilirubin: 0.4 mg/dL (ref 0.3–1.2)
Total Protein: 6.9 g/dL (ref 6.5–8.1)

## 2015-07-01 LAB — MAGNESIUM: MAGNESIUM: 1.8 mg/dL (ref 1.7–2.4)

## 2015-07-01 LAB — PHOSPHORUS: Phosphorus: 3.9 mg/dL (ref 2.5–4.6)

## 2015-07-01 MED ORDER — TRACE MINERALS CR-CU-MN-SE-ZN 10-1000-500-60 MCG/ML IV SOLN
INTRAVENOUS | Status: AC
  Administered 2015-07-01: 17:00:00 via INTRAVENOUS
  Filled 2015-07-01: qty 2520

## 2015-07-01 MED ORDER — METOPROLOL TARTRATE 12.5 MG HALF TABLET
12.5000 mg | ORAL_TABLET | Freq: Every day | ORAL | Status: DC
  Administered 2015-07-01 – 2015-07-02 (×2): 12.5 mg via ORAL
  Filled 2015-07-01 (×2): qty 1

## 2015-07-01 MED ORDER — MAGNESIUM SULFATE IN D5W 10-5 MG/ML-% IV SOLN
1.0000 g | Freq: Once | INTRAVENOUS | Status: AC
Start: 2015-07-01 — End: 2015-07-01
  Administered 2015-07-01: 1 g via INTRAVENOUS
  Filled 2015-07-01: qty 100

## 2015-07-01 MED ORDER — FAT EMULSION 20 % IV EMUL
234.0000 mL | INTRAVENOUS | Status: AC
  Administered 2015-07-01: 234 mL via INTRAVENOUS
  Filled 2015-07-01: qty 250

## 2015-07-01 NOTE — Progress Notes (Signed)
     [facility] Progress Note  Subjective:  Still feeling better.  Tolerating soft diet.  No further vomiting in the past 2-3 days.  Objective:  Vital signs in last 24 hours: Temp:  [98.4 F (36.9 C)-99.2 F (37.3 C)] 99.2 F (37.3 C) (03/02 0454) Pulse Rate:  [102-138] 122 (03/02 0454) Resp:  [18-20] 18 (03/02 0454) BP: (126-140)/(66-74) 140/71 mmHg (03/02 0454) SpO2:  [99 %-100 %] 100 % (03/02 0454) Last BM Date: 06/30/15 General:  Alert, Well-developed, in NAD Heart:  Tachy; no murmurs Pulm:  CTAB.  No W/R/R. Abdomen:  Soft, non-distended. BS present.  Multiple scars noted on abdomen and ileostomy noted on right side as well.  Non-tender. Extremities:  Without edema. Neurologic:  Alert and oriented x 4;  grossly normal neurologically. Psych:  Alert and cooperative. Normal mood and affect.  Intake/Output from previous day: 03/01 0701 - 03/02 0700 In: 5119.3 [id number] Out: 3150 [Urine:2250; Stool:900] Intake/Output this shift: Total I/O In: -  Out: 550 [Urine:300; Stool:250]  Lab Results: No results for input(s): WBC, HGB, HCT, PLT in the last 72 hours. BMET  Recent Labs  06/29/15 0411 06/30/15 0412  NA 138 135  K 4.3 4.4  CL 107 104  CO2 22 24  GLUCOSE 143* 123*  BUN 19 23*  CREATININE 1.14* 1.13*  CALCIUM 9.1 8.4*   LFT  Recent Labs  06/30/15 0412  PROT 6.3*  ALBUMIN 2.0*  AST 64*  ALT 87*  ALKPHOS 206*  BILITOT 0.6   Assessment / Plan: *70 year old female with recent surgery for GIST tumor of the rectum on 2/3 who presented with nausea and vomiting, dehydration with AKI, high ostomy output. CT scan/x-rays (last imaging 2/27) suggesting ileus vs PSBO. GES shows severe gastroparesis, which can be expected with ileus and PSBO. Likely does not have complete obstruction since she is having ostomy output, which has normalized. Likely has slow gut motility related to surgery, pain meds, decreased mobility, etc. Continues to feeling  improved. *Elevated LFT's: New since admission. ? Related to initiation of TNA/reactive. *Rectal mucus/discharge/blood: Likely normal/expected after recent surgery. I believe that this has been addressed by Dr. Johney Maine. *Anemia: Likely related to recent surgery, some rectal bleeding, etc. Hgb low but stable, but rechecked since 2/27.  -Reglan changed to 10 mg IV ACHS and zofran scheduled/RTC 4 mg every 6 hours on 2/28.  ? If she should be discharged on some PO Reglan temporarily as well. -Continue empiric PPI. -Needs to be up and moving around/walking. -Minimize narcotic use. -Trend LFT's, monitor Hgb. -Keep potassium above 4 (normal now but was quite low a few days ago). -If symptoms worsen again then would consider small bowel imaging (SBFT) to absolutely rule out partial obstruction, etc.  **Will sign off from GI standpoint.  Call with questions.   LOS: 10 days   ZEHR, JESSICA D.  07/01/2015, 9:13 AM  Pager number [phone]  GI ATTENDING  Interval history data reviewed. Agree with comprehensive progress note as outlined above. Seems to be responding to instituted GI measures. Continue with the same. They're available as needed. Will sign off.  Docia Chuck. Geri [place]., M.D. [facility] Division of Gastroenterology

## 2015-07-01 NOTE — Progress Notes (Signed)
Patient ID: Brianna Walker, female   DOB: 08-May-1945, 70 y.o.   MRN: [id number] [facility] PROGRESS NOTE  MERRIDETH MIZRACHI [id number] DOB: 06-10-1945 DOA: 06/21/2015 PCP: Cathlean Cower, MD  Brief narrative:    70 year old female status post laparoscopic and robotic resection of GIST TUMOR with lysis of adhesions on 2/3, discharged on 2/17. Pt presented to [facility] ED for evaluation of nausea vomiting, high ileostomy output, poor appetite and subjective fevers. General surgery consulted due to recent surgery. CT abdomen pelvis showed postsurgical changes, postsurgical ileus vs partial small bowel obstruction, small hiatal hernia. Chest x-ray was negative. Creatinine has increased from 0.98 to 5.91.  Hospital course is complicated with delayed gastric emptying which was noted on the study done 06/26/2015. Patient is on TPN for nutritional support. In addition, she has ongoing bloody mucus diarrhea and we consulted GI 06/29/2015.   Assessment/Plan:    Principal Problem: Vomiting / Delayed gastric emptying / Ileus / Gastroparesis / Mild ileostomy stenosis  - Delayed gastric emptying seen on the study 06/26/2015 - Xray of the abdomen 07/18/2015 - small bowel obstruction but per surgery doubt obstruction/sbo. - She actually is tolerating liquids and now diet advanced to soft. She still on TPN for nutritional support - No nausea or vomiting in past 48 hours - Continue Reglan 10 mg 3 times a day before meals and at bedtime - Continue Zofran 4 mg IV every 6 hours - Continue Protonix 80 mg by mouth twice a day - Appreciate surgery following - Continue ostomy care    Active Problems:   Diarrhea, blood and mucus in stool - C.diff on 2/20 was negative - GI pathogen panel negative   AKI (acute kidney injury) ([place]) - Likely due to GI losses due to dehydration  - Cr ranges 1.13 - 1.18, stable overall     Essential hypertension / sinus tachycardia - She is on metoprolol 12.5 mg daily   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 - Continue conservative management, appreciate surgery following  - Will discontinue estrogen therapy   Hypokalemia and hypomagnesemia  - Secondary to diarrhea, refeeding syndrome from TPN - Potassium and magnesium WNL   Iron deficiency anemia / Anemia of chronic disease  - Likely due to history of GIST tumor - Iron supplementation on hold while gastroparesis, delayed gastric emptying   Protein-calorie malnutrition, severe ([place]) - Secondary to acute/chronic related illness  - She is on now soft diet in anticipation to wean TNA - Calorie count     Obesity  - Body mass index is 35.94 kg/(m^2).   DVT Prophylaxis  - Continue apixaban and aspirin   Code Status: Full.  Family Communication:  plan of care discussed with the patient Disposition Plan: anticipate D/C by Monday or sooner depending on how her nutritional status will be   IV access:  Peripheral IV  Procedures and diagnostic studies:    Dg Abd Acute W/chest 07-18-2015  Gaseous distended loops of small bowel within the central abdomen without distal colonic gas concerning for small bowel obstruction. Small left pleural effusion with underlying opacities favored to represent atelectasis. Gas within the soft tissues overlying the abdomen.   Nm Gastric Emptying 06/26/2015  Significantly delayed  gastric emptying study.   Ct Abdomen Pelvis Wo Contrast 06/21/2015 1. Stable postsurgical changes, as described above. 2. Mildly improved postsurgical jejunal ileus or partial obstruction, with transition in the left mid abdomen. This could be an indication of an adhesion. 3. Small hiatal  hernia. 4. Colonic diverticulosis.   Dg Chest Portable 1 View 06/21/2015 Stable. No acute findings.   Dg Chest Port 1 View 06/21/2015 No acute cardiopulmonary disease.   Medical Consultants:  Surgery GI  IAnti-Infectives:   None    Leisa Lenz, MD  [facility] Pager [phone]  Time spent in minutes: 15 minutes  If 7PM-7AM, please contact night-coverage [email or web address] Password [email or web address] 07/01/2015, 11:30 AM   LOS: 10 days    HPI/Subjective: No acute overnight events. Patient reports she feels better.  Objective: Filed Vitals:   06/30/15 1353 06/30/15 2203 07/01/15 0029 07/01/15 0454  BP: 134/66 126/74  140/71  Pulse: 102 138 104 122  Temp: 98.9 F (37.2 C) 98.4 F (36.9 C)  99.2 F (37.3 C)  TempSrc: Oral Oral  Oral  Resp: 18 20  18   Height:      Weight:      SpO2: 99% 100%  100%    Intake/Output Summary (Last 24 hours) at 07/01/15 1130 Last data filed at 07/01/15 0933  Gross per 24 hour  Intake    420 ml  Output   3425 ml  Net  -3005 ml    Exam:   General:  Pt is no acute distress   Cardiovascular: Rate controlled, S1, S2 appreciated   Respiratory: no wheezing, no rhonchi   Abdomen: RUQ ileostomy (+), non tender and not distended   Extremities: No swelling, palpable pulses    Neuro: No focal deficits   Data Reviewed: Basic Metabolic Panel:  Recent Labs Lab 06/27/15 0448 06/28/15 0649 06/29/15 0411 06/30/15 0412 07/01/15 0859  NA 138 137 138 135 137  K 2.9* 4.1 4.3 4.4 4.2  CL 100* 103 107 104 105  CO2 27 25 22 24 22   GLUCOSE 115* 116* 143* 123* 131*  BUN 20 16 19  23* 28*  CREATININE 1.34* 1.06* 1.14* 1.13* 1.18*  CALCIUM 8.8* 8.8* 9.1 8.4* 8.7*  MG 1.8 1.6* 2.1 1.7 1.8  PHOS 3.3 3.2 3.2 4.2 3.9   Liver Function Tests:  Recent Labs Lab 06/26/15 0441 06/28/15 0649 06/29/15 0411 06/30/15 0412 07/01/15 0859  AST 39 53* 99* 64* 85*  ALT 42 74* 97* 87* 127*  ALKPHOS 177* 198* 233* 206* 244*  BILITOT 1.2 0.8 0.6 0.6 0.4  PROT 6.7 6.8 7.4 6.3* 6.9  ALBUMIN 2.4* 2.3* 2.4* 2.0* 2.3*   No results for input(s): LIPASE, AMYLASE in the last 168 hours. No results for input(s): AMMONIA in the last 168 hours. CBC:  Recent Labs Lab 06/25/15 0648 06/26/15 0441 06/28/15 0649  WBC 9.0 8.1  6.8  NEUTROABS  --  6.4 4.8  HGB 8.3* 8.0* 8.0*  HCT 25.3* 24.5* 24.4*  MCV 83.8 83.3 84.1  PLT 354 325 389   Cardiac Enzymes: No results for input(s): CKTOTAL, CKMB, CKMBINDEX, TROPONINI in the last 168 hours. BNP: Invalid input(s): POCBNP CBG:  Recent Labs Lab 06/30/15 0612 06/30/15 1202 06/30/15 1819 06/30/15 2343 07/01/15 0549  GLUCAP 111* 116* 121* 125* 122*    Urine culture     Status: None   Collection Time: 06/21/15  1:16 PM  Result Value Ref Range Status   Specimen Description URINE, CATHETERIZED  Final    NO GROWTH 1 DAY   Report Status 06/22/2015 FINAL  Final  C difficile quick scan w PCR reflex     Status: None   Collection Time: 06/21/15  7:21 PM  Result Value Ref Range Status   C Diff antigen NEGATIVE NEGATIVE Final  C Diff toxin NEGATIVE NEGATIVE Final   C Diff interpretation Negative for toxigenic C. difficile  Final  Gastrointestinal Panel by PCR , Stool     Status: None   Collection Time: 06/21/15  7:21 PM  Result Value Ref Range Status   Campylobacter species NOT DETECTED NOT DETECTED Final   Plesimonas shigelloides NOT DETECTED NOT DETECTED Final   Salmonella species NOT DETECTED NOT DETECTED Final   Yersinia enterocolitica NOT DETECTED NOT DETECTED Final   Vibrio species NOT DETECTED NOT DETECTED Final   Vibrio cholerae NOT DETECTED NOT DETECTED Final   Enteroaggregative E coli (EAEC) NOT DETECTED NOT DETECTED Final   Enteropathogenic E coli (EPEC) NOT DETECTED NOT DETECTED Final   Enterotoxigenic E coli (ETEC) NOT DETECTED NOT DETECTED Final   Shiga like toxin producing E coli (STEC) NOT DETECTED NOT DETECTED Final   E. coli O157 NOT DETECTED NOT DETECTED Final   Shigella/Enteroinvasive E coli (EIEC) NOT DETECTED NOT DETECTED Final   Cryptosporidium NOT DETECTED NOT DETECTED Final   Cyclospora cayetanensis NOT DETECTED NOT DETECTED Final   Entamoeba histolytica NOT DETECTED NOT DETECTED Final   Giardia lamblia NOT DETECTED NOT DETECTED Final    Adenovirus F40/41 NOT DETECTED NOT DETECTED Final   Astrovirus NOT DETECTED NOT DETECTED Final   Norovirus GI/GII NOT DETECTED NOT DETECTED Final   Rotavirus A NOT DETECTED NOT DETECTED Final   Sapovirus (I, II, IV, and V) NOT DETECTED NOT DETECTED Final    . apixaban  5 mg Oral BID  . aspirin  81 mg Oral QHS  . estradiol  0.05 mg Transdermal Weekly  . insulin aspart  0-15 Units Subcutaneous 4 times per day  . lactated ringers  2,000 mL Intravenous Once per day on Mon Wed Fri  . lip balm  1 application Topical BID  . magnesium sulfate 1 - 4 g bolus IVPB  1 g Intravenous Once  . megestrol  400 mg Oral Daily  . metoCLOPramide (REGLAN) injection  10 mg Intravenous TID AC & HS  . metoprolol tartrate  12.5 mg Oral Daily  . ondansetron  4 mg Intravenous 4 times per day  . pantoprazole  80 mg Oral BID  . protein supplement shake  8 oz Oral BID BM  . sodium chloride  2,000 mL Intravenous Once  . sodium chloride flush  10-40 mL Intracatheter Q12H     Continuous Infusions: . Marland KitchenTPN (CLINIMIX-E) Adult 105 mL/hr at 06/30/15 1708   And  . fat emulsion 240 mL (06/30/15 1707)  . Marland KitchenTPN (CLINIMIX-E) Adult     And  . fat emulsion

## 2015-07-01 NOTE — Progress Notes (Signed)
[facility]  [address]  [place], [place] [address] Phone: [phone] FAX: [phone]   Brianna Walker [id number] 05/25/1945  Problem List:   Principal Problem:   AKI (acute kidney injury) ([facility]) Active Problems:   Hyperlipidemia   Iron deficiency anemia   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   Protein-calorie malnutrition, severe ([facility])   Hypokalemia   Dehydration   Delayed gastric emptying   High output ileostomy ([facility])   Necrosis of ileostomy surgical wound ([facility])   Obesity (BMI 30-39.9)   Abdominal wall seroma - chronic      06/04/2015  POST-OPERATIVE DIAGNOSIS: GIST TUMOR OF RECTUM IN PELVIS  PROCEDURE:   LOW ANTERIOR RESECTION TRANSABDOMINAL AND TRANSANAL COLOANAL HANDSEWN ANASTOMOSIS DIVERTING LOOP ILEOSTOMY,  RESECTION GIST TUMOR LAPAROSCOPIC & ROBOTIC LYSIS OF ADHESIONS X 3 hours Posterior rectovaginal repair. Resection of cystic mass near small bowel with serosal repair.  Surgeon(s): Steven Gross, MD Alicia Thomas, MD - Assist   Assessment  RECURRENT N/V with delayed gastric emptying - improved   Plan:  -DGE/gastroparesis.  Zofran & Reglan IV RTC.  Consider transitioning to oral medications if gastroenterology agrees.  Continue calorie counts.  GI consult help appreciated.  Hold iron for possible ileus  -Poor PO with intermittent n/v & no evidence of obstruction.  Better.  Adv to solids.  Check calorie counts.  If eating better, try to switch to PO reglan/zofran & wean off TNA. Trying Megace to stimulate appetite.    -TNA until eating better.  If not  able to tolerate by mouth on oral medication, SNF with TNA until surgery  -mild ileostomy stenosis but no stricture with good output & 18Fr NGT easily intubates - doubt cause of n/v.  AXR w SB gaseous distention c/w ileus.  No colonic air because colon diverted = doubt obstruction/SBO.  ARF/dehydration -  nearly resolved but poor PO intake.  Some orthostasis by HR but not BP.   VS.  2L IVF q MWF  -Skin separation at pink ileostomy with skin necrosis - ostomy care.  Reflection of malnutrition. OSTOMY CARE / TRAINING.  Patient's lack of interest in changing & managing the bag a concern.  Complex packing/pouching working so far.  Large wound but granulating. >3 weeks postop = would treat as EC fistula & pouch & try to get nutrition better.  I worry attempt at ostomy revision would be even worse.  Can try to take ileostomy down if > 6 weeks & Albumin >3 to minimize risks of leak/breakdown.    -replace lytes PRN.  Low Mag replaced  -antidiarrheal regimen as tolerated once tolerating PO in order to avoid dehydration.  Holding given gastroparesis.  Challenging with her nausea issues.    -VTE prophylaxis- SCDs, etc.  On full dose anticoagulation given history recurrent PE - Eliquis as tolerated.    -HTN  - No ACE Inh.  Try low-dose beta blocker given intermittent tachycardia.  -mobilize as tolerated to help recovery.  GET HER UP!  Working with physical therapy.  They agree with skilled nursing facility.  -GERD - PPI  -hyperlipidemia - Tx -Pathology - ?+margin most likely fracture of GIST exposed.  Most likely will get gleevec post-adjuvant for 3 years total per Dr Sherrill.   D/C patient from hospital when patient meets criteria (anticipate in ???? day(s)):  Tolerating oral intake well Controlling diarrhea Ambulating well Adequate pain control without IV medications Urinating   Having flatus  Disposition planning in   place - SNF w ostomy care.  Increase HH IVF via PICC qMWF to 2L (instead of 1L qMWF)  Ostomy care/teaching    Adin [name], M.D., F.A.C.S. Gastrointestinal and Minimally Invasive Surgery [facility] [address] [place], [place] [address] [phone] Main / Paging   07/01/2015  Subjective:  Stays in bed.  Claims to be walking  well No anorectal pain.  Scant drainage No pain at ileostomy  Appetite better  No episodes of nausea or vomiting the last 24 hours    Objective:  Vital signs:  Filed Vitals:   06/30/15 1353 06/30/15 2203 07/01/15 0029 07/01/15 0454  BP: 134/66 126/74  140/71  Pulse: 102 138 104 122  Temp: 98.9 F (37.2 C) 98.4 F (36.9 C)  99.2 F (37.3 C)  TempSrc: Oral Oral  Oral  Resp: _0 Height:      Weight:      SpO2: 99% 100%  100%    Last BM Date: 06/30/15  Intake/Output   Yesterday:  03/01 0701 - 03/02 0700 In: 5119.3 [GGY:6948.5] Out: 3150 [Urine:2250; Stool:900] This shift:  Total I/O In: -  Out: 550 [Urine:300; Stool:250]  Bowel function:  Flatus: YES ++  BM: thin effluent in bag.   Drain: n/a  Physical Exam:  General: Pt awake/alert/oriented x4 in no acute distress.  Sitting in bed.  Calm.  Smiling, alert Eyes: PERRL, normal EOM.  Sclera clear.  No icterus Neuro: CN II-XII intact w/o focal sensory/motor deficits. Lymph: No head/neck/groin lymphadenopathy Psych:  No delerium/psychosis/paranoia HENT: Normocephalic, Mucus membranes moist.  No thrush Neck: Supple, No tracheal deviation Chest: No chest wall pain w good excursion CV:  Pulses intact.  Regular rhythm MS: Normal AROM mjr joints.  No obvious deformity  Abdomen: Soft but morbidly obese.  Nondistended.  Nontender.  No fluctuance/cellultis.  No guarding.  No evidence of peritonitis.  No incarcerated hernias.  Loop ileostomy pink with mild stenosis, moderate thick effluent out superior orifice.  No purulence/cellulitis.  No ostomy necrosis.    Rectal deferred today Ext:  SCDs BLE.  No mjr edema.  No cyanosis Skin: No petechiae / purpura  Results:   Diagnosis 1. Soft tissue, biopsy, abdominal wall cyst ? fistula BENIGN CYST AND REACTIVE CHANGES NEGATIVE FOR MALIGNANCY 2. Soft tissue mass, simple excision, Pelvic GIST GASTROINTESTINAL STROMAL TUMORS OF THE RECTUM SHOWING TREATMENT EFFECT (9.5  CM) MARGIN OF RESECTION IS POSITIVE 3. Colon, segmental resection for tumor, rectum DIVERTICULA NO RESIDUAL GASTROINTESTINAL STROMAL TUMOR IDENTIFIED MARGINS OF RESECTION ARE NEGATIVE FIFTEEN BENIGN LYMPH NODES (0/15) Microscopic Comment 2. GASTROINTESTINAL STROMAL TUMOR (GIST): Procedure: segmental resection Tumor Site: rectum Specify: pelvis rectum Tumor Size: 9.5 cm Tumor Focality Unifocal: focal GIST Subtype (spindle, epithelioid, mixed, etc.): spindle Mitotic Rate: 1 /50 HIGH POWER FIELD Necrosis: negative Histologic Grade 1 G1: Low grade; mitotic rate 5/50 HIGH POWER FIELD. G2: High grade; mitotic rate >5/50 HIGH POWER FIELD. Risk Assessment: _ Margins: positive Ancillary testing: Per request 1 of 3 FINAL for CIERRIA, HEIGHT [phone]) Microscopic Comment(continued) Lymph nodes: number examined: 15; number positive: 0 Pathologic Staging: pT3 pN0 Comments: The tumor shows treatment effect with hypocellular, atrophic spindle cells and hyalinized, fibrotic stroma. less than 1% of the tumor without treatment effect. [name] MD Pathologist, Electronic Signature (Case signed 06/10/2015) Specimen Sayra Frisby and Clinical Information Specimen(s) Obtained: 1. Soft tissue, biopsy, abdominal wall cyst ? fistula 2. Soft tissue mass, simple excision, Pelvic GIST 3. Colon, segmental resection  for tumor, rectum Specimen Clinical Information 1. GIST tumor of rectum in pelvis (kp) Gross 1. In formalin is a a 3.1 x 1.4 x 0.3 cm irregular portion of fatty tissue, which on sectioning contains a 0.5 cm collapsed smooth cystic space. Sectioned and entirely submitted in one block. 2. Received in formalin is a 176 gram, 9.5 x 6.8 x 5.8 cm rubbery to firm mass, which has an attached suture over a 2.1 x 1.5 cm roughened area, clinically identifying anterior distal. This area is inked orange. There is also a 5.0 x 4.2 cm disrupted area, which is clinically identified as disrupted margin.  This area is inked black. Also along one aspect is a 3.4 x 3.3 cm area of tan to hyperemic possible smooth mucosa. The cut surfaces of the mass are tan to tan yellow, with vaguely whorled cut surfaces, and scattered myxoid areas. Representative sections are submitted as follows: A, B = sections from clinically disrupted margin C = sections from area identified by suture as anterior distal D, E = central sections Total: 5 blocks submitted 3. Received in formalin is a 27 cm in length segment of colon, clinically recto-sigmoid, which includes at least probable middle third of rectum. Surrounding mesorectum is incomplete and focally disrupted. There is an attached suture over the anterior perirectal surface. The mucosa is tan pink, smooth, soft with normal intestinal folds. There are no mucosal lesions identified. Within the sigmoid portion are several unperforated diverticula. Found within the surrounding fat are 15 possible lymph nodes ranging from 0.1 to 0.6 cm. Block Summary: A = proximal margin B, C = distal margin D, E = sections taken at attached suture over anterior mesorectum F = diverticulum G = four nodes, whole H = four nodes, whole I = four nodes, whole J = three nodes, whole Total: 10 blocks submitted (SW:ds 06/07/15) 2 of 3 FINAL for Shawler, Bellina C ([id number]) Report signed out from the following location(s) Technical component and interpretation was performed at [facility] [address], [place], [place] [address]. CLIA #: [id number], 3 of 3   Labs: Results for orders placed or performed during the hospital encounter of 06/21/15 (from the past 48 hour(s))  Glucose, capillary     Status: Abnormal   Collection Time: 06/29/15 12:13 PM  Result Value Ref Range   Glucose-Capillary 119 (H) 65 - 99 mg/dL  Glucose, capillary     Status: Abnormal   Collection Time: 06/29/15  6:54 PM  Result Value Ref Range   Glucose-Capillary 113 (H) 65 - 99 mg/dL   Glucose, capillary     Status: Abnormal   Collection Time: 06/29/15 11:49 PM  Result Value Ref Range   Glucose-Capillary 122 (H) 65 - 99 mg/dL  Magnesium     Status: None   Collection Time: 06/30/15  4:12 AM  Result Value Ref Range   Magnesium 1.7 1.7 - 2.4 mg/dL  Comprehensive metabolic panel     Status: Abnormal   Collection Time: 06/30/15  4:12 AM  Result Value Ref Range   Sodium 135 135 - 145 mmol/L   Potassium 4.4 3.5 - 5.1 mmol/L   Chloride 104 101 - 111 mmol/L   CO2 24 22 - 32 mmol/L   Glucose, Bld 123 (H) 65 - 99 mg/dL   BUN 23 (H) 6 - 20 mg/dL   Creatinine, Ser 1.13 (H) 0.44 - 1.00 mg/dL   Calcium 8.4 (L) 8.9 - 10.3 mg/dL   Total Protein 6.3 (L) 6.5 - 8.1   g/dL   Albumin 2.0 (L) 3.5 - 5.0 g/dL   AST 64 (H) 15 - 41 U/L   ALT 87 (H) 14 - 54 U/L   Alkaline Phosphatase 206 (H) 38 - 126 U/L   Total Bilirubin 0.6 0.3 - 1.2 mg/dL   GFR calc non Af Amer 49 (L) >60 mL/min   GFR calc Af Amer 57 (L) >60 mL/min    Comment: (NOTE) The eGFR has been calculated using the CKD EPI equation. This calculation has not been validated in all clinical situations. eGFR's persistently <60 mL/min signify possible Chronic Kidney Disease.    Anion gap 7 5 - 15  Phosphorus     Status: None   Collection Time: 06/30/15  4:12 AM  Result Value Ref Range   Phosphorus 4.2 2.5 - 4.6 mg/dL  Glucose, capillary     Status: Abnormal   Collection Time: 06/30/15  6:12 AM  Result Value Ref Range   Glucose-Capillary 111 (H) 65 - 99 mg/dL  Glucose, capillary     Status: Abnormal   Collection Time: 06/30/15 12:02 PM  Result Value Ref Range   Glucose-Capillary 116 (H) 65 - 99 mg/dL  Glucose, capillary     Status: Abnormal   Collection Time: 06/30/15  6:19 PM  Result Value Ref Range   Glucose-Capillary 121 (H) 65 - 99 mg/dL  Glucose, capillary     Status: Abnormal   Collection Time: 06/30/15 11:43 PM  Result Value Ref Range   Glucose-Capillary 125 (H) 65 - 99 mg/dL  Glucose, capillary     Status:  Abnormal   Collection Time: 07/01/15  5:49 AM  Result Value Ref Range   Glucose-Capillary 122 (H) 65 - 99 mg/dL      Imaging / Studies: No results found.  Medications / Allergies: per chart  Antibiotics: Anti-infectives    None        Note: Portions of this report may have been transcribed using voice recognition software. Every effort was made to ensure accuracy; however, inadvertent computerized transcription errors may be present.   Any transcriptional errors that result from this process are unintentional.     Adin [name], M.D., F.A.C.S. Gastrointestinal and Minimally Invasive Surgery [facility] [address], [place], [place] [address] [phone] Main / Paging   07/01/2015  CARE TEAM:  PCP: Cathlean Cower, MD  Outpatient Care Team: Patient Care Team: Biagio Borg, MD as PCP - General Michael Boston, MD as Consulting Physician (General Surgery) Ladell Pier, MD as Consulting Physician (Oncology) Milus Banister, MD as Consulting Physician (Gastroenterology) Billy Fischer, MD as Consulting Physician (Family Medicine)  Inpatient Treatment Team: Treatment Team: Attending Provider: Robbie Lis, MD; Consulting Physician: Michael Boston, MD; Rounding Team: Ian Bushman, MD; Technician: Sueanne Margarita, NT; Technician: Abbe Amsterdam, NT; Technician: Parks Ranger, [place]; Registered Nurse: Bailey Mech, RN; Consulting Physician: Nolon Nations, MD; Consulting Physician: Irene Shipper, MD; Technician: Tenna Child, [place]; Registered Nurse: Arnold Long, RN; Technician: Coralie Carpen, NT; Registered Nurse: Vilma Meckel, RN; Registered Nurse: Janifer Adie, RN

## 2015-07-01 NOTE — Progress Notes (Addendum)
PARENTERAL NUTRITION CONSULT NOTE - FOLLOW UP  Pharmacy Consult for TPN Indication: intolerance to tube feeds, post-op ileus vs pSBO  No Known Allergies  Patient Measurements: Height: '5\' 3"'  (160 cm) Weight: 202 lb 13.2 oz (92 kg) IBW/kg (Calculated) : 52.4 Adjusted Body Weight: 68kg Usual Weight: 102kg (lost 24lb in last month)  Vital Signs: Temp: 99.2 F (37.3 C) (03/02 0454) Temp Source: Oral (03/02 0454) BP: 140/71 mmHg (03/02 0454) Pulse Rate: 122 (03/02 0454) Intake/Output from previous day: 03/01 0701 - 03/02 0700 In: 5119.3 [LKG:4010.2] Out: 3150 [Urine:2250; Stool:900] Intake/Output from this shift: Total I/O In: -  Out: 600 [Urine:300; Stool:300]  Labs: No results for input(s): WBC, HGB, HCT, PLT, APTT, INR in the last 72 hours.   Recent Labs  06/29/15 0411 06/30/15 0412 07/01/15 0859  NA 138 135 137  K 4.3 4.4 4.2  CL 107 104 105  CO2 '22 24 22  ' GLUCOSE 143* 123* 131*  BUN 19 23* 28*  CREATININE 1.14* 1.13* 1.18*  CALCIUM 9.1 8.4* 8.7*  MG 2.1 1.7 1.8  PHOS 3.2 4.2 3.9  PROT 7.4 6.3* 6.9  ALBUMIN 2.4* 2.0* 2.3*  AST 99* 64* 85*  ALT 97* 87* 127*  ALKPHOS 233* 206* 244*  BILITOT 0.6 0.6 0.4   Estimated Creatinine Clearance: 49.8 mL/min (by C-G formula based on Cr of 1.18).    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 She was discharged on 2/17, but returned 2/20 with nausea vomiting, high ileostomy output, poor appetite and subjective fevers. She continues with poor oral intake and nausea/vomiting. Also positive for  skin breakdown at ostomy site as a result of malnutrition. No evidence of obstruction per surgery. Pharmacy consulted to start parenteral nutrition.   Significant events:  2/25: gastric emptying study - revealed significantly delayed gastric emptying.  53m green bilious vomitus noted per RN. On Reglan. 2/27 Abdominal with chest xray performed still suggesting ileus vs pSBO.  Today, 07/01/2015:    Glucose - at goal < 150 mg/dl  Electrolytes - K+/Phos WNL, Mag WNL but at the low end of normal, Corr Ca 10.06  Renal - SCr 1.18, has been keeping slightly up likely d/t vomiting episodes but hasn't vomiting in past 2 days now.  I/O data incomplete.  Ostomy output decreasing.  LFTs - Alk phos, AST/ALT elevated, could possibly be d/t TPN. May need to consider cycling the TPN. Normal Tbili, pt does not have gallbladder.  TGs - 119 (2/25), 180 (2/27)  Prealbumin - 7.6 (2/25), 9.6 (2/27)  Pt continues to have poor appetite.  Received 1 Premier Protein yesterday, but refusing most.  On Megace, Reglan IV.  Will continue TPN until intake improves.  Calorie count ordered.  NUTRITIONAL GOALS                                                                                             RD recs: 2200-2400 kcal, 120-130 gm protein Clinimix 5/15 at a goal rate of 160m/hr + 20% fat emulsion at 114mhr to provide: 126 g/day protein, 2269Kcal/day.  PLAN                                                                                                                         This morning:  Magnesium sulfate 1gm IV x 1.  At 1800 today:  Since patient tolerating Clinimix E 5/15 at goal rate of 105 ml/hr, will transition to cycling the TPN tonight over 18 hours to see if this helps improve the elevated LFTs.  From 1800 to 1900: infuse at 50 ml/hr  From 1900 to 1100: infuse at 151 ml/hr  From 1100 to 1200: infuse at 50 ml/hr  20% fat emulsion at 13 ml/hr over 18 hours  TPN to contain standard  multivitamins and trace elements.  Continue mod SSI as ordered q6h  TPN lab panels on Mondays & Thursdays.  Check BMET and Mag level in AM.  F/u results of calorie count.  AmHershal CoriaPharmD, BCPS Pager: [phone]/06/2015 10:38 AM

## 2015-07-01 NOTE — Consult Note (Signed)
WOC ostomy follow up Stoma type/location: RLQ ileostomy with MC separation and full thickness wound Stomal assessment/size: 1 and 1/8 inch oblong Peristomal assessment: MC separation at medial and lateral stoma, medial is larger than lateral Treatment options for stomal/peristomal skin: Defect is filled with pectin powder, covered with pectin based strips and pouched. Output: light brown effluent Ostomy pouching: 1pc.convex pouch with skin barrier rings and powder.  Education provided: Patient reassured about Rehab facility disposition.  Pouching instructions (written) prepared and will be sent with patient in addition to supplies. Enrolled patient in [place] Start Discharge program: Yes [place] nursing team will follow, and will remain available to this patient, the nursingsurgical and medical teams.   Thanks, Maudie Flakes, MSN, RN, [place], Arther Abbott  Pager# [phone]

## 2015-07-01 NOTE — Progress Notes (Signed)
Calorie Count Note  48 hour calorie count ordered. Day 1 results below.  Diet: Soft Supplements: Premier Protein BID each provides 160 kcal and 30g protein  3/1-3/2: Breakfast 3/2: 355 kcal, 5g protein Lunch 3/1: nothing ordered Dinner 3/1: 181 kcal, 10g protein Supplements: 160 kcal, 30g protein  Estimated Nutritional Needs:   Kcal: 2200-2400  Protein: 120-130 grams  Fluid: >/= 2.8 L/day  Total intake: 696 kcal (32% of minimum estimated needs)  45g protein (38% of minimum estimated needs)  Nutrition Dx: Inadequate oral intake related to poor appetite as evidenced by per patient/family report, meal completion < 25%.  Goal: Pt to meet >/= 90% of their estimated nutrition needs   Intervention:  -Continue Calorie Count -Continue TPN per Pharmacy -Continue Premier Protein supplement, each provides 160 kcal, 30g protein -Encourage PO intake -Follow-up 3/3 for Day 2 results  Clayton Bibles, MS, RD, LDN Pager: [phone] After Hours Pager: [phone]

## 2015-07-01 NOTE — Clinical Social Work Placement (Signed)
CSW provided SNF bed offers to patient - she accepted [facility]. CSW confirmed with Juliann Pulse at [facility] that they would be able to take patient when ready, but they will need 24 hour notice prior to discharge to give them time to order TPN.    Raynaldo Opitz, [facility] Clinical Social Worker cell #: [phone]     CLINICAL SOCIAL WORK PLACEMENT  NOTE  Date:  07/01/2015  Patient Details  Name: Brianna Walker MRN: [id number] Date of Birth: Mar 29, 1946  Clinical Social Work is seeking post-discharge placement for this patient at the [place] level of care (*CSW will initial, date and re-position this form in  chart as items are completed):  Yes   Patient/family provided with [place] Work Department's list of facilities offering this level of care within the geographic area requested by the patient (or if unable, by the patient's family).  Yes   Patient/family informed of their freedom to choose among providers that offer the needed level of care, that participate in Medicare, Medicaid or managed care program needed by the patient, have an available bed and are willing to accept the patient.  Yes   Patient/family informed of [facility]'s ownership interest in [facility] and [facility], as well as of the fact that they are under no obligation to receive care at these facilities.  PASRR submitted to EDS on 06/28/15     PASRR number received on 06/28/15     Existing PASRR number confirmed on       FL2 transmitted to all facilities in geographic area requested by pt/family on 06/28/15     FL2 transmitted to all facilities within larger geographic area on       Patient informed that his/her managed care company has contracts with or will negotiate with certain facilities, including the following:        Yes   Patient/family informed of bed offers received.  Patient chooses bed at [facility]     Physician recommends and patient chooses bed at      Patient to be transferred to [facility] on  .  Patient to be transferred to facility by       Patient family notified on   of transfer.  Name of family member notified:        PHYSICIAN       Additional Comment:    _______________________________________________ Standley Brooking, LCSW 07/01/2015, 2:51 PM

## 2015-07-02 LAB — BASIC METABOLIC PANEL
ANION GAP: 9 (ref 5–15)
BUN: 21 mg/dL — ABNORMAL HIGH (ref 6–20)
CALCIUM: 8.5 mg/dL — AB (ref 8.9–10.3)
CHLORIDE: 108 mmol/L (ref 101–111)
CO2: 21 mmol/L — AB (ref 22–32)
Creatinine, Ser: 1.19 mg/dL — ABNORMAL HIGH (ref 0.44–1.00)
GFR calc non Af Amer: 46 mL/min — ABNORMAL LOW (ref >60)
GFR, EST AFRICAN AMERICAN: 54 mL/min — AB (ref >60)
Glucose, Bld: 160 mg/dL — ABNORMAL HIGH (ref 65–99)
Potassium: 4.3 mmol/L (ref 3.5–5.1)
Sodium: 138 mmol/L (ref 135–145)

## 2015-07-02 LAB — GLUCOSE, CAPILLARY
Glucose-Capillary: 159 mg/dL — ABNORMAL HIGH (ref 65–99)
Glucose-Capillary: 136 mg/dL — ABNORMAL HIGH (ref 65–99)
Glucose-Capillary: 138 mg/dL — ABNORMAL HIGH (ref 65–99)

## 2015-07-02 LAB — MAGNESIUM: Magnesium: 1.9 mg/dL (ref 1.7–2.4)

## 2015-07-02 MED ORDER — FAT EMULSION 20 % IV EMUL
234.0000 mL | INTRAVENOUS | Status: AC
  Administered 2015-07-02: 234 mL via INTRAVENOUS
  Filled 2015-07-02: qty 250

## 2015-07-02 MED ORDER — TRACE MINERALS CR-CU-MN-SE-ZN 10-1000-500-60 MCG/ML IV SOLN
INTRAVENOUS | Status: AC
  Administered 2015-07-02: 18:00:00 via INTRAVENOUS
  Filled 2015-07-02 (×2): qty 2000

## 2015-07-02 MED ORDER — MAGNESIUM SULFATE IN D5W 10-5 MG/ML-% IV SOLN
1.0000 g | Freq: Once | INTRAVENOUS | Status: AC
  Administered 2015-07-02: 1 g via INTRAVENOUS
  Filled 2015-07-02: qty 100

## 2015-07-02 MED ORDER — METOPROLOL TARTRATE 12.5 MG HALF TABLET
12.5000 mg | ORAL_TABLET | Freq: Two times a day (BID) | ORAL | Status: DC
Start: 2015-07-02 — End: 2015-07-06
  Administered 2015-07-02 – 2015-07-06 (×8): 12.5 mg via ORAL
  Filled 2015-07-02 (×9): qty 1

## 2015-07-02 MED ORDER — ONDANSETRON 8 MG PO TBDP
8.0000 mg | ORAL_TABLET | Freq: Two times a day (BID) | ORAL | Status: DC
  Administered 2015-07-02 – 2015-07-06 (×8): 8 mg via ORAL
  Filled 2015-07-02 (×3): qty 1
  Filled 2015-07-02: qty 2
  Filled 2015-07-02: qty 1
  Filled 2015-07-02: qty 2
  Filled 2015-07-02 (×3): qty 1
  Filled 2015-07-02: qty 2
  Filled 2015-07-02 (×2): qty 1

## 2015-07-02 MED ORDER — METOCLOPRAMIDE HCL 10 MG PO TABS
10.0000 mg | ORAL_TABLET | Freq: Three times a day (TID) | ORAL | Status: DC
  Administered 2015-07-02 – 2015-07-06 (×15): 10 mg via ORAL
  Filled 2015-07-02 (×21): qty 1

## 2015-07-02 NOTE — Progress Notes (Signed)
Patient ID: Brianna Walker, female   DOB: 29-Mar-1946, 70 y.o.   MRN: [id number] [facility] PROGRESS NOTE  SAMA CIALLELLA [id number] DOB: Sep 20, 1945 DOA: 06/21/2015 PCP: Cathlean Cower, MD  Brief narrative:    70 year old female status post laparoscopic and robotic resection of GIST TUMOR with lysis of adhesions on 2/3, discharged on 2/17. Pt presented to [facility] ED for evaluation of nausea vomiting, high ileostomy output, poor appetite and subjective fevers. General surgery consulted due to recent surgery. CT abdomen pelvis showed postsurgical changes, postsurgical ileus vs partial small bowel obstruction, small hiatal hernia. Chest x-ray was negative. Creatinine has increased from 0.98 to 5.91.  Hospital course is complicated with delayed gastric emptying which was noted on the study done 06/26/2015. Patient is on TPN for nutritional support. In addition, she has ongoing bloody mucus diarrhea and GI has seen her in consultation.   Assessment/Plan:    Principal Problem: Vomiting / Delayed gastric emptying / Ileus / Gastroparesis / Mild ileostomy stenosis  - Delayed gastric emptying seen on the study 06/26/2015 - Xray of the abdomen 07-15-2015 - small bowel obstruction / ?ileus - Continue Reglan 10 mg 3 times a day before meals and at bedtime - Continue Zofran 4 mg IV every 6 hours - Continue Protonix 80 mg by mouth twice a day - Diet - soft, tolerates well - Wean TNA - Surgery is following   Active Problems:   Diarrhea, blood and mucus in stool - C.diff on 2/20 was negative - GI pathogen panel negative - Has improved    AKI (acute kidney injury) ([place]) - Likely due to GI losses, prerenal - Cr ranges 1.13 - 1.19, stable     Essential hypertension / sinus tachycardia - Continue metoprolol 12.5 mg daily   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94 kg/(m^2).   DVT Prophylaxis  - Continue apixaban and aspirin   Code Status: Full.  Family Communication:  plan of care discussed with the patient Disposition Plan: anticipate D/C by Monday or sooner depending on her nutritional status   IV access:  Peripheral IV  Procedures and diagnostic studies:    Dg Abd Acute W/chest Jul 15, 2015  Gaseous distended loops of small bowel within the central abdomen without distal colonic gas concerning for small bowel obstruction. Small left pleural effusion with underlying opacities favored to represent atelectasis. Gas within the soft tissues overlying the abdomen.   Nm Gastric Emptying 06/26/2015  Significantly delayed  gastric emptying study.   Ct Abdomen Pelvis Wo Contrast 06/21/2015 1. Stable postsurgical changes, as described above. 2. Mildly improved postsurgical jejunal ileus or partial obstruction, with transition in the left mid abdomen. This could be an indication of an adhesion. 3. Small hiatal hernia. 4. Colonic diverticulosis.   Dg Chest Portable 1 View 06/21/2015 Stable. No acute findings.   Dg Chest Port 1 View 06/21/2015 No acute cardiopulmonary disease.  Medical Consultants:  Surgery GI  IAnti-Infectives:   None    Leisa Lenz, MD  [facility] Pager [phone]  Time spent in minutes: 15 minutes  If 7PM-7AM, please contact night-coverage [email or web address] Password [facility] 07/02/2015, 2:33 PM   LOS: 11 days     HPI/Subjective: No acute overnight events. Feels better.   Objective: Filed Vitals:   07/01/15 2220 07/02/15 0503 07/02/15 1415 07/02/15 1424  BP: 112/57 137/65  150/78  Pulse: 99 103  105  Temp: 98.9 F (37.2 C) 99 F (37.2 C) 98 F (36.7 C) 99 F (37.2 C)  TempSrc: Oral Oral  Oral  Resp: 18 18  18   Height:      Weight:      SpO2: 100% 100%  100%    Intake/Output Summary (Last 24 hours) at 07/02/15 1433 Last data filed at 07/02/15 1003  Gross per 24 hour  Intake    360 ml  Output   2275 ml  Net  -1915 ml    Exam:   General:  Pt is alert, awake   Cardiovascular: RRR, (+) S1, S2   Respiratory: bilateral air entry, no wheezing   Abdomen: RUQ ileostomy (+), no tenderness   Extremities: No edema, palpable pulses   Neuro: Nonfocal   Data Reviewed: Basic Metabolic Panel:  Recent Labs Lab 06/27/15 0448 06/28/15 0649 06/29/15 0411 06/30/15 0412 07/01/15 0859 07/02/15 0617  NA 138 137 138 135 137 138  K 2.9* 4.1 4.3 4.4 4.2 4.3  CL 100* 103 107 104 105 108  CO2 27 25 22 24 22  21*  GLUCOSE 115* 116* 143* 123* 131* 160*  BUN 20 16 19  23* 28* 21*  CREATININE 1.34* 1.06* 1.14* 1.13* 1.18* 1.19*  CALCIUM 8.8* 8.8* 9.1 8.4* 8.7* 8.5*  MG 1.8 1.6* 2.1 1.7 1.8 1.9  PHOS 3.3 3.2 3.2 4.2 3.9  --    Liver Function Tests:  Recent Labs Lab 06/26/15 0441 06/28/15 0649 06/29/15 0411 06/30/15 0412 07/01/15 0859  AST 39 53* 99* 64* 85*  ALT 42 74* 97* 87* 127*  ALKPHOS 177* 198* 233* 206* 244*  BILITOT 1.2 0.8 0.6 0.6 0.4  PROT 6.7 6.8 7.4 6.3* 6.9  ALBUMIN 2.4* 2.3* 2.4* 2.0* 2.3*   No results for input(s): LIPASE, AMYLASE in the last 168 hours. No results for input(s): AMMONIA in the last 168 hours. CBC:  Recent Labs Lab 06/26/15 0441 06/28/15 0649  WBC 8.1 6.8  NEUTROABS 6.4 4.8  HGB 8.0* 8.0*  HCT 24.5* 24.4*  MCV 83.3 84.1  PLT 325 389   Cardiac Enzymes: No results for input(s): CKTOTAL, CKMB, CKMBINDEX, TROPONINI in the last 168  hours. BNP: Invalid input(s): POCBNP CBG:  Recent Labs Lab 07/01/15 1251 07/01/15 1742 07/01/15 2354 07/02/15 0604 07/02/15 1158  GLUCAP 116* 99 111* 159* 136*    Urine culture     Status: None   Collection Time: 06/21/15  1:16 PM  Result Value Ref Range Status   Specimen Description URINE, CATHETERIZED  Final    NO GROWTH 1 DAY   Report Status 06/22/2015 FINAL  Final  C difficile quick scan w PCR reflex     Status: None   Collection Time: 06/21/15  7:21 PM  Result Value Ref Range Status   C Diff antigen NEGATIVE NEGATIVE Final   C Diff toxin NEGATIVE NEGATIVE Final   C Diff interpretation Negative for toxigenic C. difficile  Final  Gastrointestinal Panel by PCR , Stool     Status: None  Collection Time: 06/21/15  7:21 PM  Result Value Ref Range Status   Campylobacter species NOT DETECTED NOT DETECTED Final   Plesimonas shigelloides NOT DETECTED NOT DETECTED Final   Salmonella species NOT DETECTED NOT DETECTED Final   Yersinia enterocolitica NOT DETECTED NOT DETECTED Final   Vibrio species NOT DETECTED NOT DETECTED Final   Vibrio cholerae NOT DETECTED NOT DETECTED Final   Enteroaggregative E coli (EAEC) NOT DETECTED NOT DETECTED Final   Enteropathogenic E coli (EPEC) NOT DETECTED NOT DETECTED Final   Enterotoxigenic E coli (ETEC) NOT DETECTED NOT DETECTED Final   Shiga like toxin producing E coli (STEC) NOT DETECTED NOT DETECTED Final   E. coli O157 NOT DETECTED NOT DETECTED Final   Shigella/Enteroinvasive E coli (EIEC) NOT DETECTED NOT DETECTED Final   Cryptosporidium NOT DETECTED NOT DETECTED Final   Cyclospora cayetanensis NOT DETECTED NOT DETECTED Final   Entamoeba histolytica NOT DETECTED NOT DETECTED Final   Giardia lamblia NOT DETECTED NOT DETECTED Final   Adenovirus F40/41 NOT DETECTED NOT DETECTED Final   Astrovirus NOT DETECTED NOT DETECTED Final   Norovirus GI/GII NOT DETECTED NOT DETECTED Final   Rotavirus A NOT DETECTED NOT DETECTED Final   Sapovirus  (I, II, IV, and V) NOT DETECTED NOT DETECTED Final    . apixaban  5 mg Oral BID  . aspirin  81 mg Oral QHS  . insulin aspart  0-15 Units Subcutaneous 4 times per day  . megestrol  400 mg Oral Daily  . metoCLOPramide  10 mg Oral TID AC & HS  . metoprolol tartrate  12.5 mg Oral BID  . ondansetron  8 mg Oral Q12H  . pantoprazole  80 mg Oral BID  . protein supplement sh  8 oz Oral BID BM    Continuous Infusions: . Marland KitchenTPN (CLINIMIX-E) Adult     And  . fat emulsion

## 2015-07-02 NOTE — Progress Notes (Addendum)
Calorie Count Note  48 hour calorie count ordered. Day 2 results below.  Diet: Soft Supplements: Premier Protein BID each provides 160 kcal and 30g protein  3/3: Breakfast 3/3: 402 kcal, 18g protein Lunch 3/3:186 kcal, 5g Dinner 3/3: Nothing ordered  Supplements: 160 kcal, 30g protein  Estimated Nutritional Needs:   Kcal: 2200-2400  Protein: 120-130 grams  Fluid: >/= 2.8 L/day  Total intake: 748 kcal (34% of minimum estimated needs)  23g protein (19% of minimum estimated needs)  Nutrition Dx: Inadequate oral intake related to poor appetite as evidenced by per patient/family report, meal completion < 25%.  Goal: Pt to meet >/= 90% of their estimated nutrition needs   Intervention:  -Continue TPN per Pharmacy -Continue Premier Protein supplement, each provides 160 kcal, 30g protein -Encourage PO intake  Brianna Walker. Brianna Sotero, MS, RD LDN After Hours/Weekend Pager [phone]

## 2015-07-02 NOTE — Progress Notes (Signed)
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 Resection of cystic mass near small bowel with serosal repair.  Surgeon(s): Michael Boston, MD Leighton Ruff, MD - Assist   Assessment  RECURRENT N/V with delayed gastric emptying - improved   Plan:  -DGE/gastroparesis.  Switch to PO Zofran & Reglan. Transitioning to oral medications.  Gastroenterology signed off.  Continue calorie counts.    -Poor PO with intermittent n/v & no evidence of obstruction.  Better.  Adv to solids.  Check calorie counts.  Eating better, so try to switch to PO reglan/zofran.  If better & wean off TNA. Trying Megace to stimulate appetite.    -TNA until eating better.  If not  able to tolerate by mouth on oral medication, SNF withTNA until surgery.  If tol PO, wean off this weekend  -mild ileostomy stenosis but no stricture with good output & 18Fr NGT easily intubates - doubt cause of n/v.  AXR w SB gaseous distention c/w ileus.  No colonic air because colon diverted = doubt obstruction/SBO.  ARF/dehydration - nearly  resolved but poor PO intake.  Some orthostasis by HR but not BP.   VS.  2L IVF q MWF  -Skin separation at pink ileostomy with skin necrosis - ostomy care.  Reflection of malnutrition. OSTOMY CARE / TRAINING.  Patient's lack of interest in changing & managing the bag a concern.  Complex packing/pouching working so far.  Large wound but granulating. >3 weeks postop = would treat as EC fistula & pouch & try to get nutrition better.  I worry attempt at ostomy revision would be even worse.  Can try to take ileostomy down if > 6 weeks & Albumin >3 to minimize risks of leak/breakdown.    -replace lytes PRN.  Low Mag replaced  -antidiarrheal regimen as tolerated once tolerating PO in order to avoid dehydration.  Holding given gastroparesis.  Challenging with her nausea issues.    -VTE prophylaxis- SCDs, etc.  On full dose anticoagulation given history recurrent PE - Eliquis as tolerated.    -HTN  - No ACE Inh.  Try low-dose beta blocker given intermittent tachycardia.  -mobilize as tolerated to help recovery.  GET HER UP!  Working with physical therapy.  They agree with skilled nursing facility.  -GERD - PPI  -hyperlipidemia - Tx -Pathology - ?+margin most likely fracture of GIST exposed.  Most likely will get gleevec post-adjuvant for 3 years total per Dr Benay Spice.   D/C patient from hospital when patient meets criteria (anticipate in Monday day(s)):  Tolerating oral intake well Controlling diarrhea Ambulating well Adequate pain control without IV medications Urinating   Having flatus  Disposition planning in place - SNF w ostomy care.  Increase HH IVF via PICC qMWF to 2L (instead of 1L qMWF)  Ostomy care/teaching    Adin [name], M.D., F.A.C.S. Gastrointestinal and Minimally Invasive Surgery [facility] [address], [place], [place] [address] [phone] Main / Paging   07/02/2015  Subjective:  In chair.  Claims to be walking well No  anorectal pain.  Scant drainage No pain at ileostomy  Appetite better - finished all of lunch  No episodes of nausea or vomiting the last 48 hours    Objective:  Vital signs:  Filed Vitals:   07/01/15 0454 07/01/15 1357 07/01/15 2220 07/02/15 0503  BP: 140/71 120/65 112/57 137/65  Pulse: 122 92 99 103  Temp: 99.2 F (37.3 C) 98.8 F (37.1 C) 98.9 F (37.2 C) 99 F (37.2 C)  TempSrc: Oral Oral Oral Oral  Resp: _0 Height:      Weight:      SpO2: 100% 100% 100% 100%    Last BM Date: 07/01/15  Intake/Output   Yesterday:  03/02 0701 - 03/03 0700 In: 480 [P.O.:480] Out: 2975 [Urine:2100; Stool:875] This shift:  Total I/O In: 240 [P.O.:240] Out: 350 [Urine:350]  Bowel function:  Flatus: YES ++  BM: thin effluent in bag.   Drain: n/a  Physical Exam:  General: Pt awake/alert/oriented x4 in no acute distress.  Sitting in bed.  Calm.  Smiling, alert Eyes: PERRL, normal EOM.  Sclera clear.  No icterus Neuro: CN II-XII intact w/o focal sensory/motor deficits. Lymph: No head/neck/groin lymphadenopathy Psych:  No delerium/psychosis/paranoia HENT: Normocephalic, Mucus membranes moist.  No thrush Neck: Supple, No tracheal deviation Chest: No chest wall pain w good excursion CV:  Pulses intact.  Regular rhythm MS: Normal AROM mjr joints.  No obvious deformity  Abdomen: Soft but morbidly obese.  Nondistended.  Nontender.  No fluctuance/cellultis.  No guarding.  No evidence of peritonitis.  No incarcerated hernias.  Loop ileostomy pink with mild stenosis, moderate thick effluent out superior orifice.  No purulence/cellulitis.  No ostomy necrosis.    Rectal deferred today Ext:  SCDs BLE.  No mjr edema.  No cyanosis Skin: No petechiae / purpura  Results:   Diagnosis 1. Soft tissue, biopsy, abdominal wall cyst ? fistula BENIGN CYST AND REACTIVE CHANGES NEGATIVE FOR MALIGNANCY 2. Soft tissue mass, simple excision, Pelvic GIST GASTROINTESTINAL STROMAL TUMORS OF  THE RECTUM SHOWING TREATMENT EFFECT (9.5 CM) MARGIN OF RESECTION IS POSITIVE 3. Colon, segmental resection for tumor, rectum DIVERTICULA NO RESIDUAL GASTROINTESTINAL STROMAL TUMOR IDENTIFIED MARGINS OF RESECTION ARE NEGATIVE FIFTEEN BENIGN LYMPH NODES (0/15) Microscopic Comment 2. GASTROINTESTINAL STROMAL TUMOR (GIST): Procedure: segmental resection Tumor Site: rectum Specify: pelvis rectum Tumor Size: 9.5 cm Tumor Focality Unifocal: focal GIST Subtype (spindle, epithelioid, mixed, etc.): spindle Mitotic Rate: 1 /50 HIGH POWER FIELD Necrosis: negative Histologic Grade 1 G1: Low grade; mitotic rate 5/50 HIGH POWER FIELD. G2: High grade; mitotic rate >5/50 HIGH POWER FIELD. Risk Assessment: _ Margins: positive Ancillary testing: Per request 1 of 3 FINAL for CATIA, TODOROV [phone]) Microscopic Comment(continued) Lymph nodes: number examined: 15; number positive: 0 Pathologic Staging: pT3 pN0 Comments: The tumor shows treatment effect with hypocellular, atrophic spindle cells and hyalinized, fibrotic stroma. less than 1% of the tumor without treatment effect. [name] MD Pathologist, Electronic Signature (Case signed 06/10/2015) Specimen Fernanda Twaddell and Clinical Information Specimen(s) Obtained: 1. Soft tissue, biopsy, abdominal wall cyst ? fistula 2. Soft tissue  mass, simple excision, Pelvic GIST 3. Colon, segmental resection for tumor, rectum Specimen Clinical Information 1. GIST tumor of rectum in pelvis (kp) Teirra Carapia 1. In formalin is a a 3.1 x 1.4 x 0.3 cm irregular portion of fatty tissue, which on sectioning contains a 0.5 cm collapsed smooth cystic space. Sectioned and entirely submitted in one block. 2. Received in formalin is a 176 gram, 9.5 x 6.8 x 5.8 cm rubbery to firm mass, which has an attached suture over a 2.1 x 1.5 cm roughened area, clinically identifying anterior distal. This area is inked orange. There is also a 5.0 x 4.2 cm disrupted area, which is  clinically identified as disrupted margin. This area is inked black. Also along one aspect is a 3.4 x 3.3 cm area of tan to hyperemic possible smooth mucosa. The cut surfaces of the mass are tan to tan yellow, with vaguely whorled cut surfaces, and scattered myxoid areas. Representative sections are submitted as follows: A, B = sections from clinically disrupted margin C = sections from area identified by suture as anterior distal D, E = central sections Total: 5 blocks submitted 3. Received in formalin is a 27 cm in length segment of colon, clinically recto-sigmoid, which includes at least probable middle third of rectum. Surrounding mesorectum is incomplete and focally disrupted. There is an attached suture over the anterior perirectal surface. The mucosa is tan pink, smooth, soft with normal intestinal folds. There are no mucosal lesions identified. Within the sigmoid portion are several unperforated diverticula. Found within the surrounding fat are 15 possible lymph nodes ranging from 0.1 to 0.6 cm. Block Summary: A = proximal margin B, C = distal margin D, E = sections taken at attached suture over anterior mesorectum F = diverticulum G = four nodes, whole H = four nodes, whole I = four nodes, whole J = three nodes, whole Total: 10 blocks submitted (SW:ds 06/07/15) 2 of 3 FINAL for [place], VOHS ([id number]) Report signed out from the following location(s) Technical component and interpretation was performed at [email or web address], [place], [place] [address]. CLIA #: [id number], 3 of 3   Labs: Results for orders placed or performed during the hospital encounter of 06/21/15 (from the past 48 hour(s))  Glucose, capillary     Status: Abnormal   Collection Time: 06/30/15  6:19 PM  Result Value Ref Range   Glucose-Capillary 121 (H) 65 - 99 mg/dL  Glucose, capillary     Status: Abnormal   Collection Time: 06/30/15 11:43 PM  Result Value Ref Range    Glucose-Capillary 125 (H) 65 - 99 mg/dL  Glucose, capillary     Status: Abnormal   Collection Time: 07/01/15  5:49 AM  Result Value Ref Range   Glucose-Capillary 122 (H) 65 - 99 mg/dL  Magnesium     Status: None   Collection Time: 07/01/15  8:59 AM  Result Value Ref Range   Magnesium 1.8 1.7 - 2.4 mg/dL  Comprehensive metabolic panel     Status: Abnormal   Collection Time: 07/01/15  8:59 AM  Result Value Ref Range   Sodium 137 135 - 145 mmol/L   Potassium 4.2 3.5 - 5.1 mmol/L   Chloride 105 101 - 111 mmol/L   CO2 22 22 - 32 mmol/L   Glucose, Bld 131 (H) 65 - 99 mg/dL   BUN 28 (H) 6 - 20 mg/dL   Creatinine, Ser 1.18 (H) 0.44 - 1.00 mg/dL   Calcium 8.7 (L) 8.9 - 10.3  mg/dL   Total Protein 6.9 6.5 - 8.1 g/dL   Albumin 2.3 (L) 3.5 - 5.0 g/dL   AST 85 (H) 15 - 41 U/L   ALT 127 (H) 14 - 54 U/L   Alkaline Phosphatase 244 (H) 38 - 126 U/L   Total Bilirubin 0.4 0.3 - 1.2 mg/dL   GFR calc non Af Amer 47 (L) >60 mL/min   GFR calc Af Amer 54 (L) >60 mL/min    Comment: (NOTE) The eGFR has been calculated using the CKD EPI equation. This calculation has not been validated in all clinical situations. eGFR's persistently <60 mL/min signify possible Chronic Kidney Disease.    Anion gap 10 5 - 15  Phosphorus     Status: None   Collection Time: 07/01/15  8:59 AM  Result Value Ref Range   Phosphorus 3.9 2.5 - 4.6 mg/dL  Glucose, capillary     Status: Abnormal   Collection Time: 07/01/15 12:51 PM  Result Value Ref Range   Glucose-Capillary 116 (H) 65 - 99 mg/dL  Glucose, capillary     Status: None   Collection Time: 07/01/15  5:42 PM  Result Value Ref Range   Glucose-Capillary 99 65 - 99 mg/dL  Glucose, capillary     Status: Abnormal   Collection Time: 07/01/15 11:54 PM  Result Value Ref Range   Glucose-Capillary 111 (H) 65 - 99 mg/dL  Glucose, capillary     Status: Abnormal   Collection Time: 07/02/15  6:04 AM  Result Value Ref Range   Glucose-Capillary 159 (H) 65 - 99 mg/dL  Basic  metabolic panel     Status: Abnormal   Collection Time: 07/02/15  6:17 AM  Result Value Ref Range   Sodium 138 135 - 145 mmol/L   Potassium 4.3 3.5 - 5.1 mmol/L   Chloride 108 101 - 111 mmol/L   CO2 21 (L) 22 - 32 mmol/L   Glucose, Bld 160 (H) 65 - 99 mg/dL   BUN 21 (H) 6 - 20 mg/dL   Creatinine, Ser 1.19 (H) 0.44 - 1.00 mg/dL   Calcium 8.5 (L) 8.9 - 10.3 mg/dL   GFR calc non Af Amer 46 (L) >60 mL/min   GFR calc Af Amer 54 (L) >60 mL/min    Comment: (NOTE) The eGFR has been calculated using the CKD EPI equation. This calculation has not been validated in all clinical situations. eGFR's persistently <60 mL/min signify possible Chronic Kidney Disease.    Anion gap 9 5 - 15  Magnesium     Status: None   Collection Time: 07/02/15  6:17 AM  Result Value Ref Range   Magnesium 1.9 1.7 - 2.4 mg/dL  Glucose, capillary     Status: Abnormal   Collection Time: 07/02/15 11:58 AM  Result Value Ref Range   Glucose-Capillary 136 (H) 65 - 99 mg/dL      Imaging / Studies: No results found.  Medications / Allergies: per chart  Antibiotics: Anti-infectives    None        Note: Portions of this report may have been transcribed using voice recognition software. Every effort was made to ensure accuracy; however, inadvertent computerized transcription errors may be present.   Any transcriptional errors that result from this process are unintentional.     Adin [name], M.D., F.A.C.S. Gastrointestinal and Minimally Invasive Surgery [facility] [address] [place], [place] [address] [phone] Main / Paging   07/02/2015  CARE TEAM:  PCP: Jeneen Rinks  Jenny Reichmann, MD  Outpatient Care Team: Patient Care Team: Biagio Borg, MD as PCP - General Michael Boston, MD as Consulting Physician (General Surgery) Ladell Pier, MD as Consulting Physician (Oncology) Milus Banister, MD as Consulting Physician (Gastroenterology) Billy Fischer, MD as Consulting  Physician (Family Medicine)  Inpatient Treatment Team: Treatment Team: Attending Provider: Robbie Lis, MD; Consulting Physician: Michael Boston, MD; Rounding Team: Ian Bushman, MD; Technician: Sueanne Margarita, NT; Technician: Abbe Amsterdam, NT; Technician: Parks Ranger, [place]; Registered Nurse: Bailey Mech, RN; Consulting Physician: Nolon Nations, MD; Technician: Tenna Child, [place]; Registered Nurse: Arnold Long, RN; Technician: Coralie Carpen, NT; Registered Nurse: Janifer Adie, RN

## 2015-07-02 NOTE — Progress Notes (Signed)
PT Cancellation Note  Patient Details Name: Brianna Walker MRN: [id number] DOB: [birth year]   Cancelled Treatment:    Reason Eval/Treat Not Completed: Patient declined, no reason specified ("not right now")   [facility] 07/02/2015, 1:42 PM

## 2015-07-02 NOTE — Progress Notes (Addendum)
TPN  regulated to 151 gtts/ min .pharmacy notified

## 2015-07-02 NOTE — Progress Notes (Signed)
Nurse from [employer] stopped by for information on TPN. Information on how to change ostomy was given to nurse to ensure proper change.

## 2015-07-02 NOTE — Progress Notes (Addendum)
PARENTERAL NUTRITION CONSULT NOTE - FOLLOW UP  Pharmacy Consult for TPN Indication: intolerance to tube feeds, post-op ileus vs pSBO  No Known Allergies  Patient Measurements: Height: _0  (160 cm) Weight: 202 lb 13.2 oz (92 kg) IBW/kg (Calculated) : 52.4 Adjusted Body Weight: 68kg Usual Weight: 102kg (lost 24lb in last month)  Vital Signs: Temp: 99 F (37.2 C) (03/03 0503) Temp Source: Oral (03/03 0503) BP: 137/65 mmHg (03/03 0503) Pulse Rate: 103 (03/03 0503) Intake/Output from previous day: 03/02 0701 - 03/03 0700 In: 480 [P.O.:480] Out: 2975 [Urine:2100; Stool:875] Intake/Output from this shift:    Labs: No results for input(s): WBC, HGB, HCT, PLT, APTT, INR in the last 72 hours.   Recent Labs  06/30/15 0412 07/01/15 0859 07/02/15 0617  NA 135 137 138  K 4.4 4.2 4.3  CL 104 105 108  CO2 24 22 21*  GLUCOSE 123* 131* 160*  BUN 23* 28* 21*  CREATININE 1.13* 1.18* 1.19*  CALCIUM 8.4* 8.7* 8.5*  MG 1.7 1.8 1.9  PHOS 4.2 3.9  --   PROT 6.3* 6.9  --   ALBUMIN 2.0* 2.3*  --   AST 64* 85*  --   ALT 87* 127*  --   ALKPHOS 206* 244*  --   BILITOT 0.6 0.4  --    Estimated Creatinine Clearance: 49.4 mL/min (by C-G formula based on Cr of 1.19).    Recent Labs  07/01/15 1742 07/01/15 2354 07/02/15 0604  GLUCAP 99 111* 159*    Insulin Requirements:  Moderate SSI - 3 units Novolog  Current Nutrition: Soft diet, minimal intake TPN at goal  IVF: none  Central access: PICC 2/7 TPN start date:  2/24  ASSESSMENT                                                                                                    HPI: [name] s/p laparoscopic and robotic resection of GIST TUMOR lysis of adhesions on 2/3. She was discharged on 2/17, but returned 2/20 with nausea vomiting, high ileostomy output, poor appetite and subjective fevers. She continues with poor oral intake and nausea/vomiting. Also positive for skin breakdown at ostomy site as a result of  malnutrition. No evidence of obstruction per surgery. Pharmacy consulted to start parenteral nutrition.   Significant events:  2/25: gastric emptying study - revealed significantly delayed gastric emptying.  557m green bilious vomitus noted per RN. On Reglan. 2/27: Abdominal with chest xray performed still suggesting ileus vs pSBO. 3/2: Transitioned TPN to 18 hour cycle overnight; however, rate was not increased to 151 ml/hr until 12 hours into infusion (ordered to increase to goal rate of 151 ml/hr after 1 hour of TPN start).  Today, 07/02/2015:    Glucose - controlled, only 1 CBG slightly above goal (goal < 150 mg/dl)  Electrolytes - K+, Phos, Mag WNL, Corr Ca 10.06 yesterday  Renal - SCr 1.19, remaining slightly elevated.   I/O data incomplete.   LFTs - Alk phos, AST/ALT elevated, could possibly be d/t TPN. Trial cycling TPN to see if this improves LFTs.  Normal Tbili, pt does not have gallbladder.  TGs - 119 (2/25), 180 (2/27)  Prealbumin - 7.6 (2/25), 9.6 (2/27)  Pt continues to have minimal intake. On Megace, Reglan IV.  Will continue TPN until intake improves.  Calorie count x 48h ordered.  RD estimated that pt's intake as 45g of protein and 696 kcal on 3/1. Count for 3/2 pending but pt refused Refusing [employer]. She ate cereal this morning for breakfast.  No N/V for past 3 days.  NUTRITIONAL GOALS                                                                                             RD recs: 2200-2400 kcal, 120-130 gm protein Clinimix 5/15 2520 mL + 20% fat emulsion 234 mL to provide: 126 g/day protein, 2269Kcal/day.  PLAN                                                                                                                         This morning:  Magnesium sulfate 1g IV x 1 to get Mag at least 2. At 1800 today:  Will attempt to cycle Clinimix E 5/15 again today. Rate was not adjusted as ordered so patient only received ~50% of ordered TPN.  Based on  oral intake documented on 3/1, will reduce TPN (from volume of 2520 mL to 2000 mL) to account for those protein and calories from oral diet.  Will again cycle TPN tonight over 18 hours to see if this helps improve the elevated LFTs.  Will enter a nursing care order to ensure rate of TPN gets adjusted as below.  Have also communicated admin instructions to day RN today.  From 1800 to 1900: infuse at 50 ml/hr  From 1900 to 1100: infuse at 119 ml/hr  From 1100 to 1200: infuse at 50 ml/hr  20% fat emulsion at 13 ml/hr over 18 hours  TPN to contain standard multivitamins and trace elements.  Continue mod SSI as ordered q6h  TPN lab panels on Mondays & Thursdays.  Check CMET, Phos, Mag level in AM.    F/u results of calorie count and adjust TPN as indicated.  Will continue to reduce TPN as oral intake increases.  Hershal Coria, PharmD, BCPS Pager: [phone] 07/02/2015 8:50 AM

## 2015-07-03 DIAGNOSIS — K3184 Gastroparesis: Secondary | ICD-10-CM

## 2015-07-03 LAB — COMPREHENSIVE METABOLIC PANEL
ALBUMIN: 2.2 g/dL — AB (ref 3.5–5.0)
ALBUMIN: 2.2 g/dL — AB (ref 3.5–5.0)
ALK PHOS: 232 U/L — AB (ref 38–126)
ALT: 83 U/L — ABNORMAL HIGH (ref 14–54)
ALT: 88 U/L — AB (ref 14–54)
AST: 41 U/L (ref 15–41)
AST: 53 U/L — ABNORMAL HIGH (ref 15–41)
Alkaline Phosphatase: 225 U/L — ABNORMAL HIGH (ref 38–126)
Anion gap: 7 (ref 5–15)
Anion gap: 8 (ref 5–15)
BUN: 22 mg/dL — AB (ref 6–20)
BUN: 22 mg/dL — ABNORMAL HIGH (ref 6–20)
CALCIUM: 8.4 mg/dL — AB (ref 8.9–10.3)
CHLORIDE: 110 mmol/L (ref 101–111)
CO2: 24 mmol/L (ref 22–32)
CO2: 22 mmol/L (ref 22–32)
CREATININE: 0.92 mg/dL (ref 0.44–1.00)
Calcium: 9 mg/dL (ref 8.9–10.3)
Chloride: 105 mmol/L (ref 101–111)
Creatinine, Ser: 1.02 mg/dL — ABNORMAL HIGH (ref 0.44–1.00)
GFR calc Af Amer: >60 mL/min (ref >60)
GFR calc Af Amer: >60 mL/min (ref >60)
GFR calc non Af Amer: >60 mL/min (ref >60)
GFR, EST NON AFRICAN AMERICAN: 56 mL/min — AB (ref >60)
GLUCOSE: 143 mg/dL — AB (ref 65–99)
GLUCOSE: 152 mg/dL — AB (ref 65–99)
POTASSIUM: 4.5 mmol/L (ref 3.5–5.1)
Potassium: 4.2 mmol/L (ref 3.5–5.1)
SODIUM: 141 mmol/L (ref 135–145)
SODIUM: 135 mmol/L (ref 135–145)
Total Bilirubin: 0.4 mg/dL (ref 0.3–1.2)
Total Bilirubin: 0.2 mg/dL — ABNORMAL LOW (ref 0.3–1.2)
Total Protein: 6.8 g/dL (ref 6.5–8.1)
Total Protein: 6.5 g/dL (ref 6.5–8.1)

## 2015-07-03 LAB — GLUCOSE, CAPILLARY
Glucose-Capillary: 111 mg/dL — ABNORMAL HIGH (ref 65–99)
Glucose-Capillary: 128 mg/dL — ABNORMAL HIGH (ref 65–99)
Glucose-Capillary: 140 mg/dL — ABNORMAL HIGH (ref 65–99)
Glucose-Capillary: 154 mg/dL — ABNORMAL HIGH (ref 65–99)

## 2015-07-03 LAB — MAGNESIUM: MAGNESIUM: 2 mg/dL (ref 1.7–2.4)

## 2015-07-03 LAB — PHOSPHORUS: Phosphorus: 4 mg/dL (ref 2.5–4.6)

## 2015-07-03 MED ORDER — FAT EMULSION 20 % IV EMUL
234.0000 mL | INTRAVENOUS | Status: AC
  Administered 2015-07-03: 234 mL via INTRAVENOUS
  Filled 2015-07-03 (×2): qty 250

## 2015-07-03 MED ORDER — TRACE MINERALS CR-CU-MN-SE-ZN 10-1000-500-60 MCG/ML IV SOLN
INTRAVENOUS | Status: AC
  Administered 2015-07-03: 17:00:00 via INTRAVENOUS
  Filled 2015-07-03 (×2): qty 2000

## 2015-07-03 NOTE — Progress Notes (Addendum)
PARENTERAL NUTRITION CONSULT NOTE - FOLLOW UP  Pharmacy Consult for TPN Indication: intolerance to tube feeds, post-op ileus vs pSBO  No Known Allergies  Patient Measurements: Height: _0  (160 cm) Weight: 202 lb 13.2 oz (92 kg) IBW/kg (Calculated) : 52.4 Adjusted Body Weight: 68kg Usual Weight: 102kg (lost 24lb in last month)  Vital Signs: Temp: 98.1 F (36.7 C) (03/04 0631) Temp Source: Oral (03/04 0631) BP: 134/61 mmHg (03/04 0631) Pulse Rate: 96 (03/04 0631) Intake/Output from previous day: 03/03 0701 - 03/04 0700 In: 603 [P.O.:600; I.V.:3] Out: 3450 [Urine:2900; Stool:550] Intake/Output from this shift:    Labs: No results for input(s): WBC, HGB, HCT, PLT, APTT, INR in the last 72 hours.   Recent Labs  07/01/15 0859 07/02/15 0617 07/03/15 0650  NA 137 138 141  K 4.2 4.3 4.5  CL 105 108 110  CO2 22 21* 24  GLUCOSE 131* 160* 143*  BUN 28* 21* 22*  CREATININE 1.18* 1.19* 1.02*  CALCIUM 8.7* 8.5* 9.0  MG 1.8 1.9 2.0  PHOS 3.9  --  4.0  PROT 6.9  --  6.8  ALBUMIN 2.3*  --  2.2*  AST 85*  --  41  ALT 127*  --  83*  ALKPHOS 244*  --  225*  BILITOT 0.4  --  0.4   Estimated Creatinine Clearance: 57.6 mL/min (by C-G formula based on Cr of 1.02).    Recent Labs  07/02/15 1756 07/03/15 0008 07/03/15 0611  GLUCAP 138* 128* 140*    Insulin Requirements:  Moderate SSI - 7 units Novolog yesterday  Current Nutrition: Per 48-hr calorie count:  748 kcal/d (34% of minimum estimated needs)   23g protein/d (19% of minimum estimated needs)  Refusing most protein supplement shakes TPN at goal  IVF: none  Central access: PICC 2/7 TPN start date:  2/24  ASSESSMENT                                                                                                    HPI: [name] s/p laparoscopic and robotic resection of GIST TUMOR w/  lysis of adhesions on 2/3. She was discharged on 2/17, but returned 2/20 with nausea vomiting, high ileostomy output, poor  appetite and subjective fevers. She continues with poor oral intake and nausea/vomiting. Also positive for skin breakdown at ostomy site as a result of malnutrition. No evidence of obstruction per surgery. Pharmacy consulted to start parenteral nutrition.   Significant events:  2/25: gastric emptying study - revealed significantly delayed gastric emptying.  562m green bilious vomitus noted per RN. On Reglan. 2/27: Abdominal with chest xray performed still suggesting ileus vs pSBO. 3/2: Transitioned TPN to 18 hour cycle overnight; however, rate was not increased to 151 ml/hr until 12 hours into infusion (ordered to increase to goal rate of 151 ml/hr after 1 hour of TPN start). 3/4: 48-hr calorie count complete;   Today, 07/03/2015:   Glucose - controlled, only 1 CBG slightly above goal (goal < 150 mg/dl)  Electrolytes - K+, Phos, Mag WNL, Corr Ca 10.4 yesterday  Renal -  SCr wnl at baseline; UOP excellent  LFTs - AST/ALT normalizing; Alk phos still elevated but appears to have peaked.  Thinking possibly be d/t TPN. Trialed cycling TPN to see if this improves LFTs. Normal Tbili, pt does not have gallbladder.  TGs - 119 (2/25), 180 (2/27)  Prealbumin - 7.6 (2/25), 9.6 (2/27)  NUTRITIONAL GOALS                                                                                             RD recs: 2200-2400 kcal, 120-130 gm protein Clinimix 5/15 2520 mL + 20% fat emulsion 234 mL to provide: 126 g/day protein, 2269Kcal/day.  PLAN                                                                                                                         At 1800 today:  Despite oral intake, patient is not consuming enough calories to fully wean TPN at this time.  Adjustments have been made for PO intake.  Clinimix E 5/15 again today. Based on oral intake documented on 3/1, will reduce TPN to 2000 ml to account for those protein and calories from oral diet.  Will again cycle TPN tonight over 18 hours  to see if this helps improve the elevated LFTs.  RN care order entered as well.  From 1800 to 1900: infuse at 50 ml/hr  From 1900 to 1100: infuse at 119 ml/hr  From 1100 to 1200: infuse at 50 ml/hr  20% fat emulsion at 13 ml/hr over 18 hours  TPN to contain standard multivitamins and trace elements.  Continue mod SSI as ordered q6h  TPN lab panels on Mondays & Thursdays.    Reuel Boom, PharmD, BCPS Pager: [phone] 07/03/2015, 10:58 AM

## 2015-07-03 NOTE — Progress Notes (Signed)
[place] [facility] Office:  [phone] General Surgery Progress Note   LOS: 12 days  POD -     Assessment/Plan: 1.  Delayed gastric emptying  On Reglan  Getting calorie count  She says that she is eating better  2.  Ileostomy  Not really diarrhea - just normal ileostomy output 3.  LAR - 06/04/2015 - S. Gross  For GIST tumor in pelvis  4.  AKI  Creatinine - 1.02 - 07/03/2015 5.  Malnutrition  Albumin - 2.2 - 07/03/2015   Prealbumin - 9.6 on 06/28/2015 6.  DVT prophylaxis - on Eliquis 7.  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  Says that she is taking more po.  Objective:   Filed Vitals:   07/02/15 2200 07/03/15 0631  BP: 134/60 134/61  Pulse: 101 96  Temp: 98.8 F (37.1 C) 98.1 F (36.7 C)  Resp: 18 18     Intake/Output from previous day:  03/03 0701 - 03/04 0700 In: 603 [P.O.:600; I.V.:3] Out: 3450 [Urine:2900; Stool:550]  Intake/Output this shift:      Physical Exam:   General: WN obese AA F who is alert and oriented.    HEENT: Normal. Pupils equal. .   Lungs: clear   Abdomen: Soft, though some firmness in lower abdomen Ileostomy in RLQ with out put   Lab Results:   No results for input(s): WBC, HGB, HCT, PLT in the last 72 hours.  BMET   Recent Labs  07/02/15 0617 07/03/15 0650  NA 138 141  K 4.3 4.5  CL 108 110  CO2 21* 24  GLUCOSE 160* 143*  BUN 21* 22*  CREATININE 1.19* 1.02*  CALCIUM 8.5* 9.0    PT/INR  No results for input(s): LABPROT, INR in the last 72  hours.  ABG  No results for input(s): PHART, HCO3 in the last 72 hours.  Invalid input(s): PCO2, PO2   Studies/Results:  No results found.   Anti-infectives:   Anti-infectives    None      Alphonsa Overall, MD, FACS Pager: [phone] Surgery Office: [phone] 07/03/2015

## 2015-07-03 NOTE — Progress Notes (Addendum)
Patient ID: Brianna Walker, female   DOB: July 16, 1945, 70 y.o.   MRN: [id number] [facility] PROGRESS NOTE  Brianna Walker [id number] DOB: 08-15-1945 DOA: 06/21/2015 PCP: Cathlean Cower, MD  Brief narrative:    70 year old female status post laparoscopic and robotic resection of GIST TUMOR with lysis of adhesions on 2/3, discharged on 2/17. Pt presented to [facility] ED for evaluation of nausea vomiting, high ileostomy output, poor appetite and subjective fevers. General surgery consulted due to recent surgery. CT abdomen pelvis showed postsurgical changes, postsurgical ileus vs partial small bowel obstruction, small hiatal hernia. Chest x-ray was negative. Creatinine has increased from 0.98 to 5.91.  Hospital course is complicated with delayed gastric emptying which was noted on the study done 06/26/2015. Patient is on TPN for nutritional support. In addition, she has ongoing bloody mucus diarrhea and GI has seen her in consultation.   Assessment/Plan:    Principal Problem: Vomiting / Delayed gastric emptying / Ileus / Gastroparesis / Mild ileostomy stenosis  - Delayed gastric emptying seen on the study 06/26/2015 - Xray of the abdomen Jul 02, 2015 - small bowel obstruction / ?ileus - N/V improved with Reglan 10 mg 3 times a day before meals and at bedtime, Zofran 4 mg IV every 6 hours - Continue Protonix 80 mg by mouth twice a day - Wean TNA if possible - Soft diet - tolerates well   Active Problems:   Diarrhea, blood and mucus in stool - C.diff on 2/20 was negative - GI pathogen panel negative - Subsided    AKI (acute kidney injury) ([place]) - Prerenal  - Cr normalized at this point     Essential hypertension / sinus tachycardia - Continue metoprolol 12.5 mg twice daily   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 - Appreciate surgery following  - Estrogen therapy on hold    Hypokalemia and hypomagnesemia  - Secondary to diarrhea, refeeding  syndrome from TPN - Potassium and magnesium normalized  - Check BMP and magnesium level in am   Iron deficiency anemia / Anemia of chronic disease  - Secondary to history of GIST tumor - Iron supplementation on hold while gastroparesis, delayed gastric emptying - Check CBC in am   Protein-calorie malnutrition, severe ([place]) - Secondary to acute/chronic related illness  - Continue megace  - Tolerating regular diet - On TNA as well    Obesity  - Body mass index is 35.94 kg/(m^2).   DVT Prophylaxis  - Continue apixaban and aspirin   Code Status: Full.  Family Communication:  plan of care discussed with the patient Disposition Plan: anticipate D/C by Monday 3/6  IV access:  Peripheral IV  Procedures and diagnostic studies:    Dg Abd Acute W/chest 07-02-15  Gaseous distended loops of small bowel within the central abdomen without distal colonic gas concerning for small bowel obstruction. Small left pleural effusion with underlying opacities favored to represent atelectasis. Gas within the soft tissues overlying the abdomen.   Nm Gastric Emptying 06/26/2015  Significantly delayed  gastric emptying study.   Ct Abdomen Pelvis Wo Contrast 06/21/2015 1. Stable postsurgical changes, as described above. 2. Mildly improved postsurgical jejunal ileus or partial obstruction, with transition in the left mid abdomen. This could be an indication of an adhesion. 3. Small hiatal hernia. 4. Colonic diverticulosis.   Dg Chest Portable 1 View 06/21/2015 Stable. No acute findings.   Dg Chest Port 1 View 06/21/2015 No acute cardiopulmonary disease.   Medical Consultants:  Surgery GI  IAnti-Infectives:   None    Leisa Lenz, MD  [facility] Pager [phone]  Time spent in minutes: 15 minutes  If 7PM-7AM, please contact night-coverage [email or web address] Password [email or web address] 07/03/2015, 4:18 PM   LOS: 12 days    HPI/Subjective: No acute overnight events. Feels okay.    Objective: Filed Vitals:   07/02/15 2151 07/02/15 2200 07/03/15 0631 07/03/15 1516  BP: 136/64 134/60 134/61 141/67  Pulse: 101 101 96 88  Temp:  98.8 F (37.1 C) 98.1 F (36.7 C) 98 F (36.7 C)  TempSrc:  Oral Oral Oral  Resp:  18 18 18   Height:      Weight:      SpO2:  100% 100% 100%    Intake/Output Summary (Last 24 hours) at 07/03/15 1618 Last data filed at 07/03/15 1540  Gross per 24 hour  Intake    771 ml  Output   3000 ml  Net  -2229 ml    Exam:   General:  Pt is not in acute distress  Cardiovascular: Rate controlled, appreciate S1. S2   Respiratory: No wheezing, no rhonchi   Abdomen: RUQ ileostomy (+), non distended abd, non tender   Extremities: No swelling, appreciate pulses   Neuro: No focal deficits   Data Reviewed: Basic Metabolic Panel:  Recent Labs Lab 06/28/15 0649 06/29/15 0411 06/30/15 0412 07/01/15 0859 07/02/15 0617 07/03/15 0650 07/03/15 1145  NA 137 138 135 137 138 141 135  K 4.1 4.3 4.4 4.2 4.3 4.5 4.2  CL 103 107 104 105 108 110 105  CO2 25 22 24 22  21* 24 22  GLUCOSE 116* 143* 123* 131* 160* 143* 152*  BUN 16 19 23* 28* 21* 22* 22*  CREATININE 1.06* 1.14* 1.13* 1.18* 1.19* 1.02* 0.92  CALCIUM 8.8* 9.1 8.4* 8.7* 8.5* 9.0 8.4*  MG 1.6* 2.1 1.7 1.8 1.9 2.0  --   PHOS 3.2 3.2 4.2 3.9  --  4.0  --    Liver Function Tests:  Recent Labs Lab 06/29/15 0411 06/30/15 0412 07/01/15 0859 07/03/15 0650 07/03/15 1145  AST 99* 64* 85* 41 53*  ALT 97* 87* 127* 83* 88*  ALKPHOS 233* 206* 244* 225* 232*  BILITOT 0.6 0.6 0.4 0.4 0.2*  PROT 7.4 6.3* 6.9 6.8 6.5  ALBUMIN 2.4* 2.0* 2.3* 2.2* 2.2*   No results for input(s): LIPASE, AMYLASE in the last 168 hours. No results for input(s): AMMONIA in the last 168 hours. CBC:  Recent Labs Lab 06/28/15 0649  WBC 6.8  NEUTROABS 4.8  HGB 8.0*  HCT 24.4*  MCV 84.1  PLT 389   Cardiac Enzymes: No results for input(s): CKTOTAL, CKMB, CKMBINDEX, TROPONINI in the last 168  hours. BNP: Invalid input(s): POCBNP CBG:  Recent Labs Lab 07/02/15 1158 07/02/15 1756 07/03/15 0008 07/03/15 0611 07/03/15 1143  GLUCAP 136* 138* 128* 140* 154*    Urine culture     Status: None   Collection Time: 06/21/15  1:16 PM  Result Value Ref Range Status   Specimen Description URINE, CATHETERIZED  Final    NO GROWTH 1 DAY   Report Status 06/22/2015 FINAL  Final  C difficile quick scan w PCR reflex     Status: None   Collection Time: 06/21/15  7:21 PM  Result Value Ref Range Status   C Diff antigen NEGATIVE NEGATIVE Final   C Diff toxin NEGATIVE NEGATIVE Final   C Diff interpretation Negative for toxigenic C. difficile  Final  Gastrointestinal Panel by PCR , Stool  Status: None   Collection Time: 06/21/15  7:21 PM  Result Value Ref Range Status   Campylobacter species NOT DETECTED NOT DETECTED Final   Plesimonas shigelloides NOT DETECTED NOT DETECTED Final   Salmonella species NOT DETECTED NOT DETECTED Final   Yersinia enterocolitica NOT DETECTED NOT DETECTED Final   Vibrio species NOT DETECTED NOT DETECTED Final   Vibrio cholerae NOT DETECTED NOT DETECTED Final   Enteroaggregative E coli (EAEC) NOT DETECTED NOT DETECTED Final   Enteropathogenic E coli (EPEC) NOT DETECTED NOT DETECTED Final   Enterotoxigenic E coli (ETEC) NOT DETECTED NOT DETECTED Final   Shiga like toxin producing E coli (STEC) NOT DETECTED NOT DETECTED Final   E. coli O157 NOT DETECTED NOT DETECTED Final   Shigella/Enteroinvasive E coli (EIEC) NOT DETECTED NOT DETECTED Final   Cryptosporidium NOT DETECTED NOT DETECTED Final   Cyclospora cayetanensis NOT DETECTED NOT DETECTED Final   Entamoeba histolytica NOT DETECTED NOT DETECTED Final   Giardia lamblia NOT DETECTED NOT DETECTED Final   Adenovirus F40/41 NOT DETECTED NOT DETECTED Final   Astrovirus NOT DETECTED NOT DETECTED Final   Norovirus GI/GII NOT DETECTED NOT DETECTED Final   Rotavirus A NOT DETECTED NOT DETECTED Final   Sapovirus  (I, II, IV, and V) NOT DETECTED NOT DETECTED Final    . apixaban  5 mg Oral BID  . aspirin  81 mg Oral QHS  . insulin aspart  0-15 Units Subcutaneous 4 times per day  . megestrol  400 mg Oral Daily  . metoCLOPramide  10 mg Oral TID AC & HS  . metoprolol tartrate  12.5 mg Oral BID  . ondansetron  8 mg Oral Q12H  . pantoprazole  80 mg Oral BID  . protein supplement sh  8 oz Oral BID BM    Continuous Infusions: . Marland KitchenTPN (CLINIMIX-E) Adult     And  . fat emulsion

## 2015-07-04 DIAGNOSIS — E669 Obesity, unspecified: Secondary | ICD-10-CM

## 2015-07-04 LAB — GLUCOSE, CAPILLARY
GLUCOSE-CAPILLARY: 101 mg/dL — AB (ref 65–99)
GLUCOSE-CAPILLARY: 118 mg/dL — AB (ref 65–99)
Glucose-Capillary: 103 mg/dL — ABNORMAL HIGH (ref 65–99)
Glucose-Capillary: 112 mg/dL — ABNORMAL HIGH (ref 65–99)
Glucose-Capillary: 129 mg/dL — ABNORMAL HIGH (ref 65–99)
Glucose-Capillary: 142 mg/dL — ABNORMAL HIGH (ref 65–99)

## 2015-07-04 MED ORDER — FAT EMULSION 20 % IV EMUL
234.0000 mL | INTRAVENOUS | Status: AC
  Administered 2015-07-04: 234 mL via INTRAVENOUS
  Filled 2015-07-04: qty 250

## 2015-07-04 MED ORDER — TRACE MINERALS CR-CU-MN-SE-ZN 10-1000-500-60 MCG/ML IV SOLN
INTRAVENOUS | Status: AC
  Administered 2015-07-04 – 2015-07-05 (×3): via INTRAVENOUS
  Filled 2015-07-04 (×2): qty 2000

## 2015-07-04 MED ORDER — INSULIN ASPART 100 UNIT/ML ~~LOC~~ SOLN
0.0000 [IU] | SUBCUTANEOUS | Status: DC
  Administered 2015-07-05 (×2): 2 [IU] via SUBCUTANEOUS

## 2015-07-04 NOTE — Progress Notes (Signed)
Patient ID: Brianna Walker, female   DOB: November 22, 1945, 70 y.o.   MRN: [id number] [facility] PROGRESS NOTE  Brianna Walker [id number] DOB: Dec 23, 1945 DOA: 06/21/2015 PCP: Cathlean Cower, MD  Brief narrative:    70 year old female status post laparoscopic and robotic resection of GIST TUMOR with lysis of adhesions on 2/3, discharged on 2/17. Pt presented to [facility] ED for evaluation of nausea vomiting, high ileostomy output, poor appetite and subjective fevers. General surgery consulted due to recent surgery. CT abdomen pelvis showed postsurgical changes, postsurgical ileus vs partial small bowel obstruction, small hiatal hernia. Chest x-ray was negative. Creatinine has increased from 0.98 to 5.91.  Hospital course is complicated with delayed gastric emptying which was noted on the study done 06/26/2015. Patient is on TPN for nutritional support. In addition, she has ongoing bloody mucus diarrhea and GI has seen her in consultation.   Assessment/Plan:    Principal Problem: Vomiting / Delayed gastric emptying / Ileus / Gastroparesis / Mild ileostomy stenosis  - Delayed gastric emptying seen on the study 06/26/2015 - Xray of the abdomen 13-Jul-2015 - small bowel obstruction / ?ileus - Continue Reglan 10 mg 3 times a day before meals and at bedtime, Zofran 4 mg IV every 6 hours - Continue Protonix 80 mg by mouth twice a day - Diet as tolerated   Active Problems:   Diarrhea, blood and mucus in stool - C.diff on 2/20 was negative - GI pathogen panel negative - Diarrhea much improved    AKI (acute kidney injury) ([place]) - Prerenal  - Cr WNL    Essential hypertension / sinus tachycardia - Continue metoprolol 12.5 mg twice daily   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   DVT Prophylaxis  - On apixaban and aspirin   Code Status: Full.  Family Communication:  plan of care discussed with the patient Disposition Plan: D/C Monday 3/6  IV access:  Peripheral IV  Procedures and diagnostic studies:    Dg Abd Acute W/chest 07-13-15  Gaseous distended loops of small bowel within the central abdomen without distal colonic gas concerning for small bowel obstruction. Small left pleural effusion with underlying opacities favored to represent atelectasis. Gas within the soft tissues overlying the abdomen.   Nm Gastric Emptying 06/26/2015  Significantly delayed  gastric emptying study.   Ct Abdomen Pelvis Wo Contrast 06/21/2015 1. Stable postsurgical changes, as described above. 2. Mildly improved postsurgical jejunal ileus or partial obstruction, with transition in the left mid abdomen. This could be an indication of an adhesion. 3. Small hiatal hernia. 4. Colonic diverticulosis.   Dg Chest Portable 1 View 06/21/2015 Stable. No acute findings.   Dg Chest Port 1 View 06/21/2015 No acute cardiopulmonary disease.   Medical Consultants:  Surgery GI  IAnti-Infectives:   None    Leisa Lenz, MD  [facility] Pager [phone]  Time spent in minutes: 15 minutes  If 7PM-7AM, please contact night-coverage [email or web address]  Password [email or web address] 07/04/2015, 3:36 PM   LOS: 13 days    HPI/Subjective: No acute overnight events. Diarrhea subsided.   Objective: Filed Vitals:   07/03/15 1516 07/03/15 2100 07/04/15 0459 07/04/15 1407  BP: 141/67 100/51 130/59 123/69  Pulse: 88 103  115 92  Temp: 98 F (36.7 C) 98.6 F (37 C) 98.8 F (37.1 C) 98.4 F (36.9 C)  TempSrc: Oral Oral Oral Oral  Resp: 18 18 18 18   Height:      Weight:      SpO2: 100% 100% 100% 100%    Intake/Output Summary (Last 24 hours) at 07/04/15 1536 Last data filed at 07/04/15 1526  Gross per 24 hour  Intake 2621.9 ml  Output   2025 ml  Net  596.9 ml    Exam:   General:  Pt is sleeping  Cardiovascular: RRR, (+) S1, S2   Respiratory: Bilateral air entry, no wheezing   Abdomen: RUQ ileostomy (+), (+) BS  Extremities: No edema, palpable pulses   Neuro: Nonfocal   Data Reviewed: Basic Metabolic Panel:  Recent Labs Lab 06/28/15 0649 06/29/15 0411 06/30/15 0412 07/01/15 0859 07/02/15 0617 07/03/15 0650 07/03/15 1145  NA 137 138 135 137 138 141 135  K 4.1 4.3 4.4 4.2 4.3 4.5 4.2  CL 103 107 104 105 108 110 105  CO2 25 22 24 22  21* 24 22  GLUCOSE 116* 143* 123* 131* 160* 143* 152*  BUN 16 19 23* 28* 21* 22* 22*  CREATININE 1.06* 1.14* 1.13* 1.18* 1.19* 1.02* 0.92  CALCIUM 8.8* 9.1 8.4* 8.7* 8.5* 9.0 8.4*  MG 1.6* 2.1 1.7 1.8 1.9 2.0  --   PHOS 3.2 3.2 4.2 3.9  --  4.0  --    Liver Function Tests:  Recent Labs Lab 06/29/15 0411 06/30/15 0412 07/01/15 0859 07/03/15 0650 07/03/15 1145  AST 99* 64* 85* 41 53*  ALT 97* 87* 127* 83* 88*  ALKPHOS 233* 206* 244* 225* 232*  BILITOT 0.6 0.6 0.4 0.4 0.2*  PROT 7.4 6.3* 6.9 6.8 6.5  ALBUMIN 2.4* 2.0* 2.3* 2.2* 2.2*   No results for input(s): LIPASE, AMYLASE in the last 168 hours. No results for input(s): AMMONIA in the last 168 hours. CBC:  Recent Labs Lab 06/28/15 0649  WBC 6.8  NEUTROABS 4.8  HGB 8.0*  HCT 24.4*  MCV 84.1  PLT 389   Cardiac Enzymes: No results for input(s): CKTOTAL, CKMB, CKMBINDEX, TROPONINI in the last 168 hours. BNP: Invalid input(s): POCBNP CBG:  Recent Labs Lab 07/03/15 1143 07/03/15 1804 07/04/15 0100 07/04/15 0558 07/04/15 1210  GLUCAP 154* 111* 129* 142* 112*    Urine  culture     Status: None   Collection Time: 06/21/15  1:16 PM  Result Value Ref Range Status   Specimen Description URINE, CATHETERIZED  Final    NO GROWTH 1 DAY   Report Status 06/22/2015 FINAL  Final  C difficile quick scan w PCR reflex     Status: None   Collection Time: 06/21/15  7:21 PM  Result Value Ref Range Status   C Diff antigen NEGATIVE NEGATIVE Final   C Diff toxin NEGATIVE NEGATIVE Final   C Diff interpretation Negative for toxigenic C. difficile  Final  Gastrointestinal Panel by PCR , Stool     Status: None   Collection Time: 06/21/15  7:21 PM  Result Value Ref Range Status   Campylobacter species NOT DETECTED NOT DETECTED Final   Plesimonas shigelloides NOT DETECTED NOT DETECTED Final  Salmonella species NOT DETECTED NOT DETECTED Final   Yersinia enterocolitica NOT DETECTED NOT DETECTED Final   Vibrio species NOT DETECTED NOT DETECTED Final   Vibrio cholerae NOT DETECTED NOT DETECTED Final   Enteroaggregative E coli (EAEC) NOT DETECTED NOT DETECTED Final   Enteropathogenic E coli (EPEC) NOT DETECTED NOT DETECTED Final   Enterotoxigenic E coli (ETEC) NOT DETECTED NOT DETECTED Final   Shiga like toxin producing E coli (STEC) NOT DETECTED NOT DETECTED Final   E. coli O157 NOT DETECTED NOT DETECTED Final   Shigella/Enteroinvasive E coli (EIEC) NOT DETECTED NOT DETECTED Final   Cryptosporidium NOT DETECTED NOT DETECTED Final   Cyclospora cayetanensis NOT DETECTED NOT DETECTED Final   Entamoeba histolytica NOT DETECTED NOT DETECTED Final   Giardia lamblia NOT DETECTED NOT DETECTED Final   Adenovirus F40/41 NOT DETECTED NOT DETECTED Final   Astrovirus NOT DETECTED NOT DETECTED Final   Norovirus GI/GII NOT DETECTED NOT DETECTED Final   Rotavirus A NOT DETECTED NOT DETECTED Final   Sapovirus (I, II, IV, and V) NOT DETECTED NOT DETECTED Final    . apixaban  5 mg Oral BID  . aspirin  81 mg Oral QHS  . insulin aspart  0-15 Units Subcutaneous 4 times per day  . megestrol   400 mg Oral Daily  . metoCLOPramide  10 mg Oral TID AC & HS  . metoprolol tartrate  12.5 mg Oral BID  . ondansetron  8 mg Oral Q12H  . pantoprazole  80 mg Oral BID  . protein supplement sh  8 oz Oral BID BM    Continuous Infusions: . Marland KitchenTPN (CLINIMIX-E) Adult     And  . fat emulsion

## 2015-07-04 NOTE — Clinical Social Work Note (Signed)
Patient not medically ready for discharge yet, unit social worker to continue to follow patient's progress.  Plan for patient to discharge to [facility] once patient is medically ready for discharge and orders have been received.  Jones Broom. [place], MSW, [place] 07/04/2015 4:50 PM

## 2015-07-04 NOTE — Progress Notes (Addendum)
[place] [facility] Office:  [phone] General Surgery Progress Note   LOS: 13 days  POD -     Assessment/Plan: 1.  Delayed gastric emptying  On Reglan  Getting calorie count  Overall better  2.  Ileostomy  Not really diarrhea - just normal ileostomy output 3.  LAR - 06/04/2015 - S. Gross  For GIST tumor in pelvis  4.  AKI  Creatinine - 0.92 - 07/03/2015 5.  Malnutrition  Albumin - 2.2 - 07/03/2015   Prealbumin - 9.6 on 06/28/2015 6.  DVT prophylaxis - on Eliquis 7.  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  She has been getting out of the bed.  Objective:   Filed Vitals:   07/03/15 2100 07/04/15 0459  BP: 100/51 130/59  Pulse: 103 115  Temp: 98.6 F (37 C) 98.8 F (37.1 C)  Resp: 18 18     Intake/Output from previous day:  03/04 0701 - 03/05 0700 In: 2273.9 [TPN:2273.9] Out: 1400 [Urine:1100; Stool:300]  Intake/Output this shift:      Physical Exam:   General: WN obese AA F who is alert and oriented.    HEENT: Normal. Pupils equal. .   Lungs: clear   Abdomen: Soft, though some firmness in lower abdomen Ileostomy in RLQ with normal out put   Lab Results:   No results for input(s): WBC, HGB, HCT, PLT in the last 72 hours.  BMET    Recent Labs  07/03/15 0650 07/03/15 1145  NA 141 135  K 4.5 4.2  CL 110 105  CO2 24 22  GLUCOSE 143* 152*  BUN 22* 22*  CREATININE 1.02* 0.92  CALCIUM 9.0 8.4*    PT/INR  No results for input(s): LABPROT, INR in the last 72  hours.  ABG  No results for input(s): PHART, HCO3 in the last 72 hours.  Invalid input(s): PCO2, PO2   Studies/Results:  No results found.   Anti-infectives:   Anti-infectives    None      Alphonsa Overall, MD, FACS Pager: [phone] Surgery Office: [phone] 07/04/2015

## 2015-07-04 NOTE — Progress Notes (Signed)
PARENTERAL NUTRITION CONSULT NOTE - FOLLOW UP  Pharmacy Consult for TPN Indication: intolerance to tube feeds, post-op ileus vs pSBO  No Known Allergies  Patient Measurements: Height: _0  (160 cm) Weight: 202 lb 13.2 oz (92 kg) IBW/kg (Calculated) : 52.4 Adjusted Body Weight: 68kg Usual Weight: 102kg (lost 24lb in last month)  Vital Signs: Temp: 98.8 F (37.1 C) (03/05 0459) Temp Source: Oral (03/05 0459) BP: 130/59 mmHg (03/05 0459) Pulse Rate: 115 (03/05 0459) Intake/Output from previous day: 03/04 0701 - 03/05 0700 In: 2273.9 [TPN:2273.9] Out: 1400 [Urine:1100; Stool:300] Intake/Output from this shift:    Labs: No results for input(s): WBC, HGB, HCT, PLT, APTT, INR in the last 72 hours.   Recent Labs  07/02/15 0617 07/03/15 0650 07/03/15 1145  NA 138 141 135  K 4.3 4.5 4.2  CL 108 110 105  CO2 21* 24 22  GLUCOSE 160* 143* 152*  BUN 21* 22* 22*  CREATININE 1.19* 1.02* 0.92  CALCIUM 8.5* 9.0 8.4*  MG 1.9 2.0  --   PHOS  --  4.0  --   PROT  --  6.8 6.5  ALBUMIN  --  2.2* 2.2*  AST  --  41 53*  ALT  --  83* 88*  ALKPHOS  --  225* 232*  BILITOT  --  0.4 0.2*   Estimated Creatinine Clearance: 63.9 mL/min (by C-G formula based on Cr of 0.92).    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 She was discharged on 2/17, but returned 2/20 with nausea vomiting, high ileostomy  output, poor appetite and subjective fevers. She continues with poor oral intake and nausea/vomiting. Also positive for skin breakdown at ostomy site as a result of malnutrition. No evidence of obstruction per surgery. Pharmacy consulted to start parenteral nutrition.   Significant events:  2/25: gastric emptying study - revealed significantly delayed gastric emptying.  563m green bilious vomitus noted per RN. On Reglan. 2/27: Abdominal with chest xray performed still suggesting ileus vs pSBO. 3/2: Transitioned TPN to 18 hour cycle overnight; however, rate was not increased to 151 ml/hr until 12 hours into infusion (ordered to increase to goal rate of 151 ml/hr after 1 hour of TPN start). 3/5, no N/V, but minimal change in appetite; refusing protein supplements  Today, 07/04/2015:   Glucose - controlled, only 1 CBG slightly above goal (goal < 150 mg/dl)  Electrolytes (3/4) -  K+, Phos, Mag WNL, Corr Ca 10.4 yesterday  Renal - (3/4) SCr wnl at baseline; UOP lower but adequate  LFTs - (3/4) AST/ALT normalizing; Alk phos still elevated but appears to have peaked.  Thinking possibly be d/t TPN. Trialed cycling TPN to see if this improves LFTs. Normal Tbili, pt does not have gallbladder.  TGs - 119 (2/25), 180 (2/27)  Prealbumin - 7.6 (2/25), 9.6 (2/27)  NUTRITIONAL GOALS                                                                                             RD recs: 2200-2400 kcal, 120-130 gm protein Clinimix 5/15 2520 mL + 20% fat emulsion 234 mL to provide: 126 g/day protein, 2269Kcal/day.  PLAN                                                                                                                         At 1800 today:  Despite improved oral intake, patient is not consuming enough calories to fully wean TPN at this time.  Adjustments have been made for PO intake.  Clinimix E 5/15 again today. Based on oral intake documented on 3/1, have reduced TPN to 2000 ml to account  for those protein and calories from oral diet.  Will again cycle TPN tonight over 18 hours to see if this helps improve the elevated LFTs.  RN care order entered as well. If prealbumin better on Monday, could consider decreasing TPN in attempt to stimulate appetite, no issues with N/V recently  From 1800 to 1900: infuse at 50 ml/hr  From 1900 to 1100: infuse at 119 ml/hr  From 1100 to 1200: infuse at 50 ml/hr  20% fat emulsion at 13 ml/hr over 18 hours  TPN to contain standard multivitamins and trace elements.  Continue mod SSI: 2 hrs after starting TPN, while on, 1 hr after stopping TPN, and while off.  TPN lab panels on Mondays & Thursdays.  Reuel Boom, PharmD, BCPS Pager: [phone] 07/04/2015, 9:07 AM

## 2015-07-05 LAB — CBC
HCT: 23.6 % — ABNORMAL LOW (ref 36.0–46.0)
Hemoglobin: 7.5 g/dL — ABNORMAL LOW (ref 12.0–15.0)
MCH: 26.6 pg (ref 26.0–34.0)
MCHC: 31.8 g/dL (ref 30.0–36.0)
MCV: 83.7 fL (ref 78.0–100.0)
PLATELETS: 504 10*3/uL — AB (ref 150–400)
RBC: 2.82 MIL/uL — AB (ref 3.87–5.11)
RDW: 16.5 % — ABNORMAL HIGH (ref 11.5–15.5)
WBC: 8.7 10*3/uL (ref 4.0–10.5)

## 2015-07-05 LAB — COMPREHENSIVE METABOLIC PANEL
ALBUMIN: 2.3 g/dL — AB (ref 3.5–5.0)
ALT: 83 U/L — ABNORMAL HIGH (ref 14–54)
ANION GAP: 7 (ref 5–15)
AST: 49 U/L — ABNORMAL HIGH (ref 15–41)
Alkaline Phosphatase: 233 U/L — ABNORMAL HIGH (ref 38–126)
BILIRUBIN TOTAL: 0.5 mg/dL (ref 0.3–1.2)
BUN: 20 mg/dL (ref 6–20)
CO2: 20 mmol/L — AB (ref 22–32)
Calcium: 8.5 mg/dL — ABNORMAL LOW (ref 8.9–10.3)
Chloride: 108 mmol/L (ref 101–111)
Creatinine, Ser: 1.13 mg/dL — ABNORMAL HIGH (ref 0.44–1.00)
GFR calc Af Amer: 57 mL/min — ABNORMAL LOW (ref >60)
GFR calc non Af Amer: 49 mL/min — ABNORMAL LOW (ref >60)
GLUCOSE: 147 mg/dL — AB (ref 65–99)
POTASSIUM: 4.3 mmol/L (ref 3.5–5.1)
SODIUM: 135 mmol/L (ref 135–145)
TOTAL PROTEIN: 6.3 g/dL — AB (ref 6.5–8.1)

## 2015-07-05 LAB — DIFFERENTIAL
Basophils Absolute: 0 10*3/uL (ref 0.0–0.1)
Basophils Relative: 0 %
EOS ABS: 0.3 10*3/uL (ref 0.0–0.7)
EOS PCT: 3 %
Lymphocytes Relative: 18 %
Lymphs Abs: 1.6 10*3/uL (ref 0.7–4.0)
MONO ABS: 0.4 10*3/uL (ref 0.1–1.0)
Monocytes Relative: 5 %
NEUTROS ABS: 6.4 10*3/uL (ref 1.7–7.7)
Neutrophils Relative %: 74 %

## 2015-07-05 LAB — TRIGLYCERIDES: Triglycerides: 183 mg/dL — ABNORMAL HIGH (ref <150)

## 2015-07-05 LAB — GLUCOSE, CAPILLARY
GLUCOSE-CAPILLARY: 104 mg/dL — AB (ref 65–99)
GLUCOSE-CAPILLARY: 105 mg/dL — AB (ref 65–99)
GLUCOSE-CAPILLARY: 126 mg/dL — AB (ref 65–99)
GLUCOSE-CAPILLARY: 128 mg/dL — AB (ref 65–99)
GLUCOSE-CAPILLARY: 74 mg/dL (ref 65–99)
Glucose-Capillary: 141 mg/dL — ABNORMAL HIGH (ref 65–99)

## 2015-07-05 LAB — PHOSPHORUS: Phosphorus: 3.9 mg/dL (ref 2.5–4.6)

## 2015-07-05 LAB — PREALBUMIN: Prealbumin: 28.1 mg/dL (ref 18–38)

## 2015-07-05 LAB — MAGNESIUM: Magnesium: 1.8 mg/dL (ref 1.7–2.4)

## 2015-07-05 MED ORDER — MAGNESIUM SULFATE IN D5W 10-5 MG/ML-% IV SOLN
1.0000 g | Freq: Once | INTRAVENOUS | Status: AC
  Administered 2015-07-05: 1 g via INTRAVENOUS
  Filled 2015-07-05: qty 100

## 2015-07-05 NOTE — Progress Notes (Signed)
PARENTERAL NUTRITION CONSULT NOTE - FOLLOW UP  Pharmacy Consult for TPN Indication: intolerance to tube feeds, post-op ileus vs pSBO  No Known Allergies  Patient Measurements: Height: '5\' 3"'  (160 cm) Weight: 202 lb 13.2 oz (92 kg) IBW/kg (Calculated) : 52.4 Adjusted Body Weight: 68kg Usual Weight: 102kg (lost 24lb in last month)  Vital Signs: Temp: 98.7 F (37.1 C) (03/06 0515) Temp Source: Oral (03/06 0515) BP: 112/63 mmHg (03/06 0515) Pulse Rate: 95 (03/06 0515) Intake/Output from previous day: 03/05 0701 - 03/06 0700 In: 2532 [P.O.:600; BLT:9030] Out: 2575 [Urine:2225; Stool:350] Intake/Output from this shift:    Labs:  Recent Labs  07/05/15 0434  WBC 8.7  HGB 7.5*  HCT 23.6*  PLT 504*     Recent Labs  07/03/15 0650 07/03/15 1145 07/05/15 0434  NA 141 135 135  K 4.5 4.2 4.3  CL 110 105 108  CO2 24 22 20*  GLUCOSE 143* 152* 147*  BUN 22* 22* 20  CREATININE 1.02* 0.92 1.13*  CALCIUM 9.0 8.4* 8.5*  MG 2.0  --  1.8  PHOS 4.0  --  3.9  PROT 6.8 6.5 6.3*  ALBUMIN 2.2* 2.2* 2.3*  AST 41 53* 49*  ALT 83* 88* 83*  ALKPHOS 225* 232* 233*  BILITOT 0.4 0.2* 0.5   Estimated Creatinine Clearance: 52 mL/min (by C-G formula based on Cr of 1.13).    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 She was discharged on 2/17, but returned 2/20 with nausea vomiting, high  ileostomy output, poor appetite and subjective fevers. She continues with poor oral intake and nausea/vomiting. Also positive for skin breakdown at ostomy site as a result of malnutrition. No evidence of obstruction per surgery. Pharmacy consulted to start parenteral nutrition.   Significant events:  2/25: gastric emptying study - revealed significantly delayed gastric emptying.  580m green bilious vomitus noted per RN. On Reglan. 2/27: Abdominal with chest xray performed still suggesting ileus vs pSBO. 3/2: Transitioned TPN to 18 hour cycle overnight; however, rate was not increased to 151 ml/hr until 12 hours into infusion (ordered to increase to goal rate of 151 ml/hr after 1 hour of TPN start). 3/5, no N/V, but minimal change in appetite; refusing protein supplements 3/6: patient reported eating about 50% of meals and appetite has greatly improved  Today, 07/05/2015:   Glucose (goal <150): all cbgs within goal  Electrolytes (3/4):  K+, Phos, CoCa WNL; Mag 1.8  Renal:  SCr 1.13  LFTs - (3/4) AST/ALT normalizing; Alk phos still elevated but appears to have peaked.  Thinking possibly be d/t TPN. Trialed cycling TPN to see if this improves LFTs. Normal Tbili, pt does not have gallbladder.  TGs - 119 (2/25), 180 (2/27)  Prealbumin - 7.6 (2/25), 9.6 (2/27), 28.1 (3/6)  NUTRITIONAL GOALS                                                                                             RD recs: 2200-2400 kcal, 120-130 gm protein Clinimix 5/15 2520 mL + 20% fat emulsion 234 mL to provide: 126 g/day protein, 2269Kcal/day.  PLAN                                                                                                                         - Per Dr. Johney Maine, wean TPN off to see if oral intake improves. Resume if patient has emesis.  Patient's currently on cyclic TPN (18 hrs cycling)-- will hold TPN today. - Will monitor oral intake closely and resume TPN if/when appropriate - Magnesium  sulfate 1gm IV x1 today  Dia Sitter, PharmD, BCPS 07/05/2015 8:33 AM

## 2015-07-05 NOTE — Progress Notes (Signed)
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     PT Comments    Pt progressing well; in good spirits today, motivated to incr activity; encouraged pt to amb again later with nursing staff; planning for SNF tomorrow; pt requires min/guard to min without RW support  Follow Up Recommendations  SNF     Equipment Recommendations  None recommended by PT    Recommendations for Other Services       Precautions / Restrictions Precautions Precautions: Fall Precaution Comments: ileostomy Restrictions Weight Bearing Restrictions: No    Mobility  Bed Mobility Overal bed mobility: Modified Independent             General bed mobility comments: HOB elevated 30*, no use of rails  Transfers Overall transfer level: Modified independent Equipment used: 4-wheeled walker Transfers: Sit to/from Stand Sit to Stand: Modified independent (Device/Increase time)            Ambulation/Gait Ambulation/Gait assistance: Supervision Ambulation Distance (Feet): 290 Feet Assistive device: 4-wheeled walker Gait Pattern/deviations: Step-through pattern;Trunk flexed Gait velocity: decreased   General Gait Details: pt doing well maneuvering RW, steady but slow gait speed   Stairs            Wheelchair Mobility    Modified Rankin (Stroke Patients Only)       Balance                                    Cognition Arousal/Alertness: Awake/alert Behavior During Therapy: WFL for tasks assessed/performed Overall Cognitive Status: Within Functional Limits for tasks assessed                       Exercises      General Comments        Pertinent Vitals/Pain Pain Assessment: No/denies pain    Home Living                      Prior Function            PT Goals (current goals can now be found in the care plan section) Acute Rehab PT Goals Patient Stated Goal: rehab then home PT Goal Formulation: With patient Time For Goal Achievement: 07/06/15 Potential to Achieve Goals: Good Progress towards PT goals: Progressing toward goals    Frequency  Min 3X/week    PT Plan Current plan remains appropriate    Co-evaluation             End of Session   Activity Tolerance: Patient tolerated treatment well Patient left: in bed;with call bell/phone within reach     Time: 1456-1514 PT Time Calculation (min) (ACUTE ONLY): 18 min  Charges:  $Gait Training: 8-22 mins                    G Codes:      Brianna Walker 2015-07-23, 3:16 PM

## 2015-07-05 NOTE — Consult Note (Signed)
WOC ostomy follow up Stoma type/location: RLQ ileostomy with MC separation and full thickness wound Stomal assessment/size: 1 and 1/8 inch oblong Peristomal assessment: MC separation at medial and lateral stoma. Peristomal skin is intact otherwise Treatment options for stomal/peristomal skin: Defect is filled with pectin powder, covered with pectin based strips and pouched. Output: light brown effluent Ostomy pouching: 1pc.convex pouch with skin barrier rings and powder.  Education provided: Patient reassured about Rehab facility disposition. Pouching instructions (written) prepared and will be sent with patient in addition to supplies.  Pouch is changed today.  Patient anticipating discharge to rehab today.  Supplies are ordered and in the room.  Enrolled patient in [place] Start Discharge program: Yes [place] team will follow through discharge.  Domenic Moras RN BSN [place] Pager [phone]

## 2015-07-05 NOTE — Progress Notes (Signed)
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 Resection of cystic mass near small bowel with serosal repair.  Surgeon(s): Michael Boston, MD Leighton Ruff, MD - Assist   Assessment  RECURRENT N/V with delayed gastric emptying - improved   Plan:  -DGE/gastroparesis.  Tolerating solids >50% meals w/o emesis >48hours.   On PO Zofran & Reglan. Transitioning to oral medications.  Trying Megace to stimulate appetite.  Gastroenterology signed off.  Continue calorie counts.    -Try wean off TNA.  If not able to tolerate by mouth on oral medication, SNF withTNA until surgery.  If tol PO, wean off & d/c soon  -mild ileostomy stenosis but no stricture with good output & 18Fr NGT easily intubates - doubt cause of n/v.  AXR w SB gaseous distention c/w ileus.  No colonic air because colon diverted = doubt obstruction/SBO.  ARF/dehydration - nearly resolved but poor PO intake.  Some orthostasis by HR but not BP.   VS.   2L IVF q MWF will need that until > 8 weeks out  -Skin separation at pink ileostomy  with skin necrosis - ostomy care.  Reflection of malnutrition. OSTOMY CARE / TRAINING.  Patient's lack of interest in changing & managing the bag a concern.  Complex packing/pouching working so far.  Large wound but granulating. >3 weeks postop = would treat as EC fistula & pouch & try to get nutrition better.  I worry attempt at ostomy revision would be even worse.  Can try to take ileostomy down if > 6 weeks & Albumin >3 to minimize risks of leak/breakdown.    -replace lytes PRN.  Low Mag replaced  -Holding any antidiarrheal regimen as tolerated given gastroparesis.  Challenging with her nausea issues.    -VTE prophylaxis- SCDs, etc.  On full dose anticoagulation given history recurrent PE - Eliquis as tolerated.    -HTN  - No ACE Inh.  Try low-dose beta blocker given intermittent tachycardia.  -mobilize as tolerated to help recovery.  GET HER UP!  Working with physical therapy.  They agree with skilled nursing facility.  -GERD - PPI  -hyperlipidemia - Tx -Pathology - ?+margin most likely fracture of GIST exposed.  Most likely will get gleevec post-adjuvant for 3 years total per Dr Benay Spice.   D/C patient from hospital when patient meets criteria (anticipate later Monday vs Tuesday if medicine agress):  Tolerating oral intake well Ambulating well Adequate pain control without IV medications Urinating  Having flatus Disposition planning in place:  -SNF w ostomy care -PICC  -2L IVF bolus qMWF to avoid dehydration      Adin [name], M.D., F.A.C.S. Gastrointestinal  and Minimally Invasive Surgery [facility] [address] [place], [place] [address] [phone] Main / Paging   07/05/2015  Subjective:  In bed.  Claims to be walking well No anorectal pain.  Scant drainage No pain at ileostomy  Appetite better - finishing =>50% meals  No episodes of nausea or vomiting the last 48 hours  Wants to go to SNF today  Objective:  Vital  signs:  Filed Vitals:   07/04/15 0459 07/04/15 1407 07/04/15 2114 07/05/15 0515  BP: 130/59 123/69 127/58 112/63  Pulse: 115 92 92 95  Temp: 98.8 F (37.1 C) 98.4 F (36.9 C) 99.1 F (37.3 C) 98.7 F (37.1 C)  TempSrc: Oral Oral Oral Oral  Resp: '18 18 16 16  ' Height:      Weight:      SpO2: 100% 100% 100% 100%    Last BM Date: 07/05/15  Intake/Output   Yesterday:  03/05 0701 - 03/06 0700 In: 2532 [P.O.:600; WGN:5621] Out: 2575 [Urine:2225; Stool:350] This shift:     Bowel function:  Flatus: Yes  BM: thick effluent in bag.   Drain: n/a  Physical Exam:  General: Pt awake/alert/oriented x4 in no acute distress.  Sitting in bed.  Calm.  Smiling, alert Eyes: PERRL, normal EOM.  Sclera clear.  No icterus Neuro: CN II-XII intact w/o focal sensory/motor deficits. Lymph: No head/neck/groin lymphadenopathy Psych:  No delerium/psychosis/paranoia HENT: Normocephalic, Mucus membranes moist.  No thrush Neck: Supple, No tracheal deviation Chest: No chest wall pain w good excursion CV:  Pulses intact.  Regular rhythm MS: Normal AROM mjr joints.  No obvious deformity  Abdomen: Soft but morbidly obese.  Nondistended.  Nontender.  No fluctuance/cellultis.  No guarding.  No evidence of peritonitis.  No incarcerated hernias.  Loop ileostomy pink, mild thick effluent out superior orifice.  No purulence/cellulitis.  No ostomy necrosis.    Rectal deferred today Ext:  SCDs BLE.  No mjr edema.  No cyanosis Skin: No petechiae / purpura  Results:   Diagnosis 1. Soft tissue, biopsy, abdominal wall cyst ? fistula BENIGN CYST AND REACTIVE CHANGES NEGATIVE FOR MALIGNANCY 2. Soft tissue mass, simple excision, Pelvic GIST GASTROINTESTINAL STROMAL TUMORS OF THE RECTUM SHOWING TREATMENT EFFECT (9.5 CM) MARGIN OF RESECTION IS POSITIVE 3. Colon, segmental resection for tumor, rectum DIVERTICULA NO RESIDUAL GASTROINTESTINAL STROMAL TUMOR IDENTIFIED MARGINS OF RESECTION ARE NEGATIVE FIFTEEN  BENIGN LYMPH NODES (0/15) Microscopic Comment 2. GASTROINTESTINAL STROMAL TUMOR (GIST): Procedure: segmental resection Tumor Site: rectum Specify: pelvis rectum Tumor Size: 9.5 cm Tumor Focality Unifocal: focal GIST Subtype (spindle, epithelioid, mixed, etc.): spindle Mitotic Rate: 1 /50 HIGH POWER FIELD Necrosis: negative Histologic Grade 1 G1: Low grade; mitotic rate 5/50 HIGH POWER FIELD. G2: High grade; mitotic rate >5/50 HIGH POWER FIELD. Risk Assessment: _ Margins: positive Ancillary testing: Per request 1 of 3 FINAL for AMELIYA, NICOTRA [phone]) Microscopic Comment(continued) Lymph nodes: number examined: 15; number positive: 0 Pathologic Staging: pT3 pN0 Comments: The tumor shows treatment effect with hypocellular, atrophic spindle cells and hyalinized, fibrotic stroma. less than 1% of the tumor without treatment effect. [name] MD Pathologist, Electronic Signature (Case signed 06/10/2015) Specimen Aveen Stansel and Clinical Information Specimen(s) Obtained: 1. Soft tissue, biopsy, abdominal wall cyst ? fistula 2. Soft tissue mass, simple excision, Pelvic GIST 3. Colon, segmental resection for tumor, rectum Specimen Clinical Information 1. GIST tumor of rectum in pelvis (kp) Sarabeth Benton 1. In formalin is a a 3.1 x 1.4 x 0.3 cm irregular portion of  fatty tissue, which on sectioning contains a 0.5 cm collapsed smooth cystic space. Sectioned and entirely submitted in one block. 2. Received in formalin is a 176 gram, 9.5 x 6.8 x 5.8 cm rubbery to firm mass, which has an attached suture over a 2.1 x 1.5 cm roughened area, clinically identifying anterior distal. This area is inked orange. There is also a 5.0 x 4.2 cm disrupted area, which is clinically identified as disrupted margin. This area is inked black. Also along one aspect is a 3.4 x 3.3 cm area of tan to hyperemic possible smooth mucosa. The cut surfaces of the mass are tan to tan yellow, with vaguely whorled cut  surfaces, and scattered myxoid areas. Representative sections are submitted as follows: A, B = sections from clinically disrupted margin C = sections from area identified by suture as anterior distal D, E = central sections Total: 5 blocks submitted 3. Received in formalin is a 27 cm in length segment of colon, clinically recto-sigmoid, which includes at least probable middle third of rectum. Surrounding mesorectum is incomplete and focally disrupted. There is an attached suture over the anterior perirectal surface. The mucosa is tan pink, smooth, soft with normal intestinal folds. There are no mucosal lesions identified. Within the sigmoid portion are several unperforated diverticula. Found within the surrounding fat are 15 possible lymph nodes ranging from 0.1 to 0.6 cm. Block Summary: A = proximal margin B, C = distal margin D, E = sections taken at attached suture over anterior mesorectum F = diverticulum G = four nodes, whole H = four nodes, whole I = four nodes, whole J = three nodes, whole Total: 10 blocks submitted (SW:ds 06/07/15) 2 of 3 FINAL for TWYLIA, OKA ([id number]) Report signed out from the following location(s) Technical component and interpretation was performed at [facility], [place], [place] [address]. CLIA #: [id number], 3 of 3   Labs: Results for orders placed or performed during the hospital encounter of 06/21/15 (from the past 48 hour(s))  Glucose, capillary     Status: Abnormal   Collection Time: 07/03/15 11:43 AM  Result Value Ref Range   Glucose-Capillary 154 (H) 65 - 99 mg/dL  Comprehensive metabolic panel     Status: Abnormal   Collection Time: 07/03/15 11:45 AM  Result Value Ref Range   Sodium 135 135 - 145 mmol/L   Potassium 4.2 3.5 - 5.1 mmol/L   Chloride 105 101 - 111 mmol/L   CO2 22 22 - 32 mmol/L   Glucose, Bld 152 (H) 65 - 99 mg/dL   BUN 22 (H) 6 - 20 mg/dL   Creatinine, Ser 0.92 0.44 - 1.00 mg/dL    Calcium 8.4 (L) 8.9 - 10.3 mg/dL   Total Protein 6.5 6.5 - 8.1 g/dL   Albumin 2.2 (L) 3.5 - 5.0 g/dL   AST 53 (H) 15 - 41 U/L   ALT 88 (H) 14 - 54 U/L   Alkaline Phosphatase 232 (H) 38 - 126 U/L   Total Bilirubin 0.2 (L) 0.3 - 1.2 mg/dL   GFR calc non Af Amer >60 >60 mL/min   GFR calc Af Amer >60 >60 mL/min    Comment: (NOTE) The eGFR has been calculated using the CKD EPI equation. This calculation has not been validated in all clinical situations. eGFR's persistently <60 mL/min signify possible Chronic Kidney Disease.    Anion gap 8 5 - 15  Glucose, capillary     Status: Abnormal   Collection Time:  07/03/15  6:04 PM  Result Value Ref Range   Glucose-Capillary 111 (H) 65 - 99 mg/dL  Glucose, capillary     Status: Abnormal   Collection Time: 07/04/15  1:00 AM  Result Value Ref Range   Glucose-Capillary 129 (H) 65 - 99 mg/dL  Glucose, capillary     Status: Abnormal   Collection Time: 07/04/15  5:58 AM  Result Value Ref Range   Glucose-Capillary 142 (H) 65 - 99 mg/dL  Glucose, capillary     Status: Abnormal   Collection Time: 07/04/15 12:10 PM  Result Value Ref Range   Glucose-Capillary 112 (H) 65 - 99 mg/dL  Glucose, capillary     Status: Abnormal   Collection Time: 07/04/15  5:59 PM  Result Value Ref Range   Glucose-Capillary 101 (H) 65 - 99 mg/dL  Glucose, capillary     Status: Abnormal   Collection Time: 07/04/15  7:42 PM  Result Value Ref Range   Glucose-Capillary 118 (H) 65 - 99 mg/dL  Glucose, capillary     Status: Abnormal   Collection Time: 07/04/15 11:59 PM  Result Value Ref Range   Glucose-Capillary 103 (H) 65 - 99 mg/dL  Glucose, capillary     Status: Abnormal   Collection Time: 07/05/15  3:57 AM  Result Value Ref Range   Glucose-Capillary 126 (H) 65 - 99 mg/dL  Comprehensive metabolic panel     Status: Abnormal   Collection Time: 07/05/15  4:34 AM  Result Value Ref Range   Sodium 135 135 - 145 mmol/L   Potassium 4.3 3.5 - 5.1 mmol/L   Chloride 108 101 -  111 mmol/L   CO2 20 (L) 22 - 32 mmol/L   Glucose, Bld 147 (H) 65 - 99 mg/dL   BUN 20 6 - 20 mg/dL   Creatinine, Ser 1.13 (H) 0.44 - 1.00 mg/dL   Calcium 8.5 (L) 8.9 - 10.3 mg/dL   Total Protein 6.3 (L) 6.5 - 8.1 g/dL   Albumin 2.3 (L) 3.5 - 5.0 g/dL   AST 49 (H) 15 - 41 U/L   ALT 83 (H) 14 - 54 U/L   Alkaline Phosphatase 233 (H) 38 - 126 U/L   Total Bilirubin 0.5 0.3 - 1.2 mg/dL   GFR calc non Af Amer 49 (L) >60 mL/min   GFR calc Af Amer 57 (L) >60 mL/min    Comment: (NOTE) The eGFR has been calculated using the CKD EPI equation. This calculation has not been validated in all clinical situations. eGFR's persistently <60 mL/min signify possible Chronic Kidney Disease.    Anion gap 7 5 - 15  Magnesium     Status: None   Collection Time: 07/05/15  4:34 AM  Result Value Ref Range   Magnesium 1.8 1.7 - 2.4 mg/dL  Phosphorus     Status: None   Collection Time: 07/05/15  4:34 AM  Result Value Ref Range   Phosphorus 3.9 2.5 - 4.6 mg/dL  CBC     Status: Abnormal   Collection Time: 07/05/15  4:34 AM  Result Value Ref Range   WBC 8.7 4.0 - 10.5 K/uL   RBC 2.82 (L) 3.87 - 5.11 MIL/uL   Hemoglobin 7.5 (L) 12.0 - 15.0 g/dL   HCT 23.6 (L) 36.0 - 46.0 %   MCV 83.7 78.0 - 100.0 fL   MCH 26.6 26.0 - 34.0 pg   MCHC 31.8 30.0 - 36.0 g/dL   RDW 16.5 (H) 11.5 - 15.5 %   Platelets 504 (H) 150 -  400 K/uL  Differential     Status: None   Collection Time: 07/05/15  4:34 AM  Result Value Ref Range   Neutrophils Relative % 74 %   Lymphocytes Relative 18 %   Monocytes Relative 5 %   Eosinophils Relative 3 %   Basophils Relative 0 %   Neutro Abs 6.4 1.7 - 7.7 K/uL   Lymphs Abs 1.6 0.7 - 4.0 K/uL   Monocytes Absolute 0.4 0.1 - 1.0 K/uL   Eosinophils Absolute 0.3 0.0 - 0.7 K/uL   Basophils Absolute 0.0 0.0 - 0.1 K/uL   RBC Morphology TARGET CELLS    WBC Morphology MILD LEFT SHIFT (1-5% METAS, OCC MYELO, OCC BANDS)       Imaging / Studies: No results found.  Medications / Allergies: per  chart  Antibiotics: Anti-infectives    None        Note: Portions of this report may have been transcribed using voice recognition software. Every effort was made to ensure accuracy; however, inadvertent computerized transcription errors may be present.   Any transcriptional errors that result from this process are unintentional.     Adin [name], M.D., F.A.C.S. Gastrointestinal and Minimally Invasive Surgery [facility] [address] [place], [place] [address] [phone] Main / Paging   07/05/2015  CARE TEAM:  PCP: Cathlean Cower, MD  Outpatient Care Team: Patient Care Team: Biagio Borg, MD as PCP - General Michael Boston, MD as Consulting Physician (General Surgery) Ladell Pier, MD as Consulting Physician (Oncology) Milus Banister, MD as Consulting Physician (Gastroenterology) Billy Fischer, MD as Consulting Physician (Family Medicine)  Inpatient Treatment Team: Treatment Team: Attending Provider: Robbie Lis, MD; Consulting Physician: Michael Boston, MD; Rounding Team: Ian Bushman, MD; Technician: Sueanne Margarita, NT; Technician: Abbe Amsterdam, NT; Technician: Parks Ranger, [place]; Registered Nurse: Bailey Mech, RN; Technician: Tenna Child, [place]; Registered Nurse: Arnold Long, RN; Technician: Coralie Carpen, NT; Registered Nurse: Celedonio Savage, RN; Registered Nurse: Janifer Adie, RN

## 2015-07-05 NOTE — Progress Notes (Signed)
Nutrition Follow-up  DOCUMENTATION CODES:   Severe malnutrition in context of acute illness/injury, Obesity unspecified  INTERVENTION:   D/c Premier Protein Encourage PO intake RD to continue to monitor for nutritional needs  NUTRITION DIAGNOSIS:   Inadequate oral intake related to poor appetite as evidenced by per patient/family report, meal completion < 25%.  Improved.  GOAL:   Patient will meet greater than or equal to 90% of their needs  Meeting currently.  MONITOR:   PO intake, Supplement acceptance, Labs, Weight trends, Skin, I & O's, Other (Comment) (TPN)  ASSESSMENT:   70 year old female status post laparoscopic and robotic resection of GIST TUMOR lysis of adhesions on 2/3, discharged on 2/17, presents to the ER today with acute renal failure, dehydration, nausea vomiting, high ileostomy output, poor appetite and subjective fevers. General surgery consulted due to recent surgery. CT abdomen pelvis shows postsurgical changes, postsurgical ileus vs partial small bowel obstruction, small hiatal hernia.  Pt to wean off TPN today. Pt with improved appetite and eating 100% of her breakfast this morning (egg white veggie omelette and lemonade). Pt has been refusing Premier Protein shakes, will d/c order. RD to continue to monitor for needs. Possible discharge, 3/7.  Plan per Pharmacy:  - Per Dr. Johney Maine, wean TPN off to see if oral intake improves. Resume if patient has emesis. Patient's currently on cyclic TPN (18 hrs cycling)-- will hold TPN today. - Will monitor oral intake closely and resume TPN if/when appropriate - Magnesium sulfate 1gm IV x1 today  Labs reviewed: CBGs: 126-141 Mg/Phos WNL  Diet Order:  Diet - low sodium heart healthy Diet Heart Room service appropriate?: Yes; Fluid consistency:: Thin  Skin:  Wound (see comment) (skin breakdown around ostomy)  Last BM:  3/6  Height:   Ht Readings from Last 1 Encounters:  06/22/15 5\' 3"  (1.6 m)    Weight:    Wt Readings from Last 1 Encounters:  06/22/15 202 lb 13.2 oz (92 kg)    Ideal Body Weight:  52.27 kg (kg)  BMI:  Body mass index is 35.94 kg/(m^2).  Estimated Nutritional Needs:   Kcal:  2200-2400  Protein:  120-130 grams  Fluid:  >/= 2.8 L/day  EDUCATION NEEDS:   Education needs addressed  Clayton Bibles, MS, RD, LDN Pager: [phone] After Hours Pager: [phone]

## 2015-07-05 NOTE — Progress Notes (Signed)
Patient ID: Brianna Walker, female   DOB: [birth year], 70 y.o.   MRN: [id number] [facility] PROGRESS NOTE  Brianna Walker [id number] DOB: 07/08/1945 DOA: 06/21/2015 PCP: Cathlean Cower, MD  Brief narrative:    70 year old female status post laparoscopic and robotic resection of GIST TUMOR with lysis of adhesions on 2/3, discharged on 2/17. Pt presented to [facility] ED for evaluation of nausea vomiting, high ileostomy output, poor appetite and subjective fevers. General surgery consulted due to recent surgery. CT abdomen pelvis showed postsurgical changes, postsurgical ileus vs partial small bowel obstruction, small hiatal hernia. Chest x-ray was negative. Creatinine has increased from 0.98 to 5.91.  Hospital course is complicated with delayed gastric emptying which was noted on the study done 06/26/2015. Patient requires TPN for nutritional support.   Assessment/Plan:    Principal Problem: Vomiting / Delayed gastric emptying / Ileus / Gastroparesis / Mild ileostomy stenosis  - Delayed gastric emptying seen on the study 06/26/2015 - Xray of the abdomen 07-09-15 - small bowel obstruction / ?ileus - Symptoms better with Reglan 10 mg 3 times a day before meals and at bedtime, Zofran 4 mg IV every 6 hours, Protonix 80 mg by mouth twice a day - Diet as tolerated  - Stop TNA per surgery   Active Problems:   Diarrhea, blood and mucus in stool - C.diff on 2/20 was negative - GI pathogen panel negative - Diarrhea resolved    AKI (acute kidney injury) ([place]) - Prerenal  - Cr stable, 0.92 - 1.13    Essential hypertension / sinus tachycardia - Continue metoprolol 12.5 mg twice daily   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   DVT Prophylaxis  - Continue apixaban and aspirin   Code Status: Full.  Family Communication:  plan of care discussed with the patient Disposition Plan: D/C 3/7 to SNF  IV access:  Peripheral IV  Procedures and diagnostic studies:    Dg Abd Acute W/chest 09-Jul-2015  Gaseous distended loops of small bowel within the central abdomen without distal colonic gas concerning for small bowel obstruction. Small left pleural effusion with underlying opacities favored to represent atelectasis. Gas within the soft tissues overlying the abdomen.   Nm Gastric Emptying 06/26/2015  Significantly delayed  gastric emptying study.   Ct Abdomen Pelvis Wo Contrast 06/21/2015 1. Stable postsurgical changes, as described above. 2. Mildly improved postsurgical jejunal ileus or partial obstruction, with transition in the left mid abdomen. This could be an indication of an adhesion. 3. Small hiatal hernia. 4. Colonic diverticulosis.   Dg Chest Portable 1 View 06/21/2015 Stable. No acute findings.   Dg Chest Port 1 View 06/21/2015 No acute cardiopulmonary disease.   Medical Consultants:  Surgery GI  IAnti-Infectives:   None    Leisa Lenz, MD  [facility] Pager [phone]  Time spent in minutes: 15 minutes  If 7PM-7AM, please contact night-coverage [email or web address] Password  [email or web address] 07/05/2015, 10:19 AM   LOS: 14 days    HPI/Subjective: No acute overnight events. Feel better.   Objective: Filed Vitals:   07/04/15 0459 07/04/15 1407 07/04/15 2114 07/05/15 0515  BP: 130/59 123/69 127/58 112/63  Pulse: 115 92 92 95  Temp: 98.8 F (37.1 C) 98.4 F (36.9 C) 99.1  F (37.3 C) 98.7 F (37.1 C)  TempSrc: Oral Oral Oral Oral  Resp: 18 18 16 16   Height:      Weight:      SpO2: 100% 100% 100% 100%    Intake/Output Summary (Last 24 hours) at 07/05/15 1019 Last data filed at 07/05/15 0600  Gross per 24 hour  Intake   2016 ml  Output   2075 ml  Net    -59 ml    Exam:   General:  Pt is better, no distress   Cardiovascular: Rate controlled, appreciate S1. S2   Respiratory: No wheezing, no rhonchi   Abdomen: RUQ ileostomy (+), appreciate bowel sounds, non tender   Extremities: No swelling, palpable pulses   Neuro: No focal disease   Data Reviewed: Basic Metabolic Panel:  Recent Labs Lab 06/29/15 0411 06/30/15 0412 07/01/15 0859 07/02/15 0617 07/03/15 0650 07/03/15 1145 07/05/15 0434  NA 138 135 137 138 141 135 135  K 4.3 4.4 4.2 4.3 4.5 4.2 4.3  CL 107 104 105 108 110 105 108  CO2 22 24 22  21* 24 22 20*  GLUCOSE 143* 123* 131* 160* 143* 152* 147*  BUN 19 23* 28* 21* 22* 22* 20  CREATININE 1.14* 1.13* 1.18* 1.19* 1.02* 0.92 1.13*  CALCIUM 9.1 8.4* 8.7* 8.5* 9.0 8.4* 8.5*  MG 2.1 1.7 1.8 1.9 2.0  --  1.8  PHOS 3.2 4.2 3.9  --  4.0  --  3.9   Liver Function Tests:  Recent Labs Lab 06/30/15 0412 07/01/15 0859 07/03/15 0650 07/03/15 1145 07/05/15 0434  AST 64* 85* 41 53* 49*  ALT 87* 127* 83* 88* 83*  ALKPHOS 206* 244* 225* 232* 233*  BILITOT 0.6 0.4 0.4 0.2* 0.5  PROT 6.3* 6.9 6.8 6.5 6.3*  ALBUMIN 2.0* 2.3* 2.2* 2.2* 2.3*   No results for input(s): LIPASE, AMYLASE in the last 168 hours. No results for input(s): AMMONIA in the last 168 hours. CBC:  Recent Labs Lab 07/05/15 0434  WBC 8.7  NEUTROABS 6.4  HGB 7.5*  HCT 23.6*  MCV 83.7  PLT 504*   Cardiac Enzymes: No results for input(s): CKTOTAL, CKMB, CKMBINDEX, TROPONINI in the last 168 hours. BNP: Invalid input(s): POCBNP CBG:  Recent Labs Lab 07/04/15 1759 07/04/15 1942 07/04/15 2359 07/05/15 0357 07/05/15 0806  GLUCAP 101* 118* 103* 126*  128*    Urine culture     Status: None   Collection Time: 06/21/15  1:16 PM  Result Value Ref Range Status   Specimen Description URINE, CATHETERIZED  Final    NO GROWTH 1 DAY   Report Status 06/22/2015 FINAL  Final  C difficile quick scan w PCR reflex     Status: None   Collection Time: 06/21/15  7:21 PM  Result Value Ref Range Status   C Diff antigen NEGATIVE NEGATIVE Final   C Diff toxin NEGATIVE NEGATIVE Final   C Diff interpretation Negative for toxigenic C. difficile  Final  Gastrointestinal Panel by PCR , Stool     Status: None   Collection Time: 06/21/15  7:21 PM  Result Value Ref Range Status   Campylobacter species NOT DETECTED NOT  DETECTED Final   Plesimonas shigelloides NOT DETECTED NOT DETECTED Final   Salmonella species NOT DETECTED NOT DETECTED Final   Yersinia enterocolitica NOT DETECTED NOT DETECTED Final   Vibrio species NOT DETECTED NOT DETECTED Final   Vibrio cholerae NOT DETECTED NOT DETECTED Final   Enteroaggregative E coli (EAEC) NOT DETECTED NOT DETECTED Final   Enteropathogenic E coli (EPEC) NOT DETECTED NOT DETECTED Final   Enterotoxigenic E coli (ETEC) NOT DETECTED NOT DETECTED Final   Shiga like toxin producing E coli (STEC) NOT DETECTED NOT DETECTED Final   E. coli O157 NOT DETECTED NOT DETECTED Final   Shigella/Enteroinvasive E coli (EIEC) NOT DETECTED NOT DETECTED Final   Cryptosporidium NOT DETECTED NOT DETECTED Final   Cyclospora cayetanensis NOT DETECTED NOT DETECTED Final   Entamoeba histolytica NOT DETECTED NOT DETECTED Final   Giardia lamblia NOT DETECTED NOT DETECTED Final   Adenovirus F40/41 NOT DETECTED NOT DETECTED Final   Astrovirus NOT DETECTED NOT DETECTED Final   Norovirus GI/GII NOT DETECTED NOT DETECTED Final   Rotavirus A NOT DETECTED NOT DETECTED Final   Sapovirus (I, II, IV, and V) NOT DETECTED NOT DETECTED Final    . apixaban  5 mg Oral BID  . aspirin  81 mg Oral QHS  . insulin aspart  0-15 Units Subcutaneous 4 times per day   . megestrol  400 mg Oral Daily  . metoCLOPramide  10 mg Oral TID AC & HS  . metoprolol tartrate  12.5 mg Oral BID  . ondansetron  8 mg Oral Q12H  . pantoprazole  80 mg Oral BID  . protein supplement sh  8 oz Oral BID BM    Continuous Infusions: . Marland KitchenTPN (CLINIMIX-E) Adult 119 mL/hr at 07/05/15 0323   And  . fat emulsion 234 mL (07/04/15 1807)

## 2015-07-05 NOTE — Care Management Important Message (Signed)
Important Message  Patient Details  Name: Brianna Walker MRN: [id number] Date of Birth: 23-Feb-1946   Medicare Important Message Given:  Yes    Camillo Flaming 07/05/2015, 4:52 [place] Message  Patient Details  Name: Brianna Walker MRN: [id number] Date of Birth: [birth year]   Medicare Important Message Given:  Yes    Camillo Flaming 07/05/2015, 4:48 PM

## 2015-07-05 NOTE — Clinical Social Work Placement (Signed)
Patient has a bed at [facility]. CSW confirmed with RN, Brayton Layman that plan is to wean patient off TPN today. CSW has completed FL2 & will continue to follow and assist with discharge when ready - anticipating possible discharge tomorrow - Tues, 3/7.    Raynaldo Opitz, [facility] Clinical Social Worker cell #: [phone]     CLINICAL SOCIAL WORK PLACEMENT  NOTE  Date:  07/05/2015  Patient Details  Name: Brianna Walker MRN: [id number] Date of Birth: Sep 01, 1945  Clinical Social Work is seeking post-discharge placement for this patient at the [place] level of care (*CSW will initial, date and re-position this form in  chart as items are completed):  Yes   Patient/family provided with [place] Work Department's list of facilities offering this level of care within the geographic area requested by the patient (or if unable, by the patient's family).  Yes   Patient/family informed of their freedom to choose among providers that offer the needed level of care, that participate in Medicare, Medicaid or managed care program needed by the patient, have an available bed and are willing to accept the patient.  Yes   Patient/family informed of [facility]'s ownership interest in [facility] and [facility], as well as of the fact that they are under no obligation to receive care at these facilities.  PASRR submitted to EDS on 06/28/15     PASRR number received on 06/28/15     Existing PASRR number confirmed on       FL2 transmitted to all facilities in geographic area requested by pt/family on 06/28/15     FL2 transmitted to all facilities within larger geographic area on       Patient informed that his/her managed care company has contracts with or will negotiate with certain facilities, including the following:        Yes   Patient/family informed of bed offers received.  Patient chooses bed at [facility]     Physician recommends and patient chooses bed at      Patient to be transferred to [facility] on  .  Patient to be transferred to facility by       Patient family notified on   of transfer.  Name of family member notified:        PHYSICIAN       Additional Comment:    _______________________________________________ Standley Brooking, LCSW 07/05/2015, 12:07 PM

## 2015-07-06 ENCOUNTER — Other Ambulatory Visit: Payer: Self-pay | Admitting: Surgery

## 2015-07-06 DIAGNOSIS — D509 Iron deficiency anemia, unspecified: Secondary | ICD-10-CM | POA: Diagnosis not present

## 2015-07-06 DIAGNOSIS — E669 Obesity, unspecified: Secondary | ICD-10-CM | POA: Diagnosis not present

## 2015-07-06 DIAGNOSIS — E860 Dehydration: Secondary | ICD-10-CM | POA: Diagnosis not present

## 2015-07-06 DIAGNOSIS — E46 Unspecified protein-calorie malnutrition: Secondary | ICD-10-CM | POA: Diagnosis not present

## 2015-07-06 DIAGNOSIS — R279 Unspecified lack of coordination: Secondary | ICD-10-CM | POA: Diagnosis not present

## 2015-07-06 DIAGNOSIS — C49A5 Gastrointestinal stromal tumor of rectum: Secondary | ICD-10-CM | POA: Diagnosis not present

## 2015-07-06 DIAGNOSIS — Z8611 Personal history of tuberculosis: Secondary | ICD-10-CM | POA: Diagnosis not present

## 2015-07-06 DIAGNOSIS — M6281 Muscle weakness (generalized): Secondary | ICD-10-CM | POA: Diagnosis not present

## 2015-07-06 DIAGNOSIS — C49A4 Gastrointestinal stromal tumor of large intestine: Secondary | ICD-10-CM | POA: Diagnosis not present

## 2015-07-06 DIAGNOSIS — E785 Hyperlipidemia, unspecified: Secondary | ICD-10-CM | POA: Diagnosis not present

## 2015-07-06 DIAGNOSIS — E43 Unspecified severe protein-calorie malnutrition: Secondary | ICD-10-CM | POA: Diagnosis not present

## 2015-07-06 DIAGNOSIS — K3184 Gastroparesis: Secondary | ICD-10-CM | POA: Diagnosis not present

## 2015-07-06 DIAGNOSIS — D649 Anemia, unspecified: Secondary | ICD-10-CM | POA: Diagnosis not present

## 2015-07-06 DIAGNOSIS — I10 Essential (primary) hypertension: Secondary | ICD-10-CM | POA: Diagnosis not present

## 2015-07-06 DIAGNOSIS — Z433 Encounter for attention to colostomy: Secondary | ICD-10-CM | POA: Diagnosis not present

## 2015-07-06 DIAGNOSIS — K30 Functional dyspepsia: Secondary | ICD-10-CM | POA: Diagnosis not present

## 2015-07-06 DIAGNOSIS — R112 Nausea with vomiting, unspecified: Secondary | ICD-10-CM | POA: Diagnosis not present

## 2015-07-06 DIAGNOSIS — E876 Hypokalemia: Secondary | ICD-10-CM | POA: Diagnosis not present

## 2015-07-06 DIAGNOSIS — F419 Anxiety disorder, unspecified: Secondary | ICD-10-CM | POA: Diagnosis not present

## 2015-07-06 DIAGNOSIS — N179 Acute kidney failure, unspecified: Secondary | ICD-10-CM | POA: Diagnosis not present

## 2015-07-06 LAB — CBC
HEMATOCRIT: 25.6 % — AB (ref 36.0–46.0)
HEMOGLOBIN: 8.1 g/dL — AB (ref 12.0–15.0)
MCH: 26.8 pg (ref 26.0–34.0)
MCHC: 31.6 g/dL (ref 30.0–36.0)
MCV: 84.8 fL (ref 78.0–100.0)
Platelets: 563 10*3/uL — ABNORMAL HIGH (ref 150–400)
RBC: 3.02 MIL/uL — AB (ref 3.87–5.11)
RDW: 16.8 % — ABNORMAL HIGH (ref 11.5–15.5)
WBC: 9.1 10*3/uL (ref 4.0–10.5)

## 2015-07-06 LAB — GLUCOSE, CAPILLARY
GLUCOSE-CAPILLARY: 87 mg/dL (ref 65–99)
GLUCOSE-CAPILLARY: 99 mg/dL (ref 65–99)
Glucose-Capillary: 80 mg/dL (ref 65–99)
Glucose-Capillary: 99 mg/dL (ref 65–99)

## 2015-07-06 LAB — PREPARE RBC (CROSSMATCH)

## 2015-07-06 MED ORDER — LORAZEPAM 0.5 MG PO TABS: 0.5000 mg | ORAL_TABLET | Freq: Every day | ORAL | Status: DC

## 2015-07-06 MED ORDER — METOCLOPRAMIDE HCL 10 MG PO TABS: 10.0000 mg | ORAL_TABLET | Freq: Three times a day (TID) | ORAL | Status: DC

## 2015-07-06 MED ORDER — SODIUM CHLORIDE 0.9 % IV SOLN
Freq: Once | INTRAVENOUS | Status: AC
  Administered 2015-07-06: 15:00:00 via INTRAVENOUS

## 2015-07-06 MED ORDER — PANTOPRAZOLE SODIUM 40 MG PO TBEC: 80.0000 mg | DELAYED_RELEASE_TABLET | Freq: Two times a day (BID) | ORAL | Status: DC

## 2015-07-06 MED ORDER — HEPARIN SOD (PORK) LOCK FLUSH 100 UNIT/ML IV SOLN
250.0000 [IU] | INTRAVENOUS | Status: DC | PRN
Start: 2015-07-06 — End: 2015-07-06
  Administered 2015-07-06: 250 [IU]
  Filled 2015-07-06 (×2): qty 3

## 2015-07-06 MED ORDER — MEGESTROL ACETATE 40 MG/ML PO SUSP: 400.0000 mg | Freq: Every day | ORAL | Status: DC

## 2015-07-06 MED ORDER — TRAMADOL HCL 50 MG PO TABS: 50.0000 mg | ORAL_TABLET | Freq: Four times a day (QID) | ORAL | Status: DC | PRN

## 2015-07-06 MED ORDER — ONDANSETRON 8 MG PO TBDP: 8.0000 mg | ORAL_TABLET | Freq: Two times a day (BID) | ORAL | Status: DC

## 2015-07-06 MED ORDER — METOPROLOL TARTRATE 25 MG PO TABS: 12.5000 mg | ORAL_TABLET | Freq: Two times a day (BID) | ORAL | Status: DC

## 2015-07-06 MED ORDER — HEPARIN SOD (PORK) LOCK FLUSH 100 UNIT/ML IV SOLN
250.0000 [IU] | Freq: Every day | INTRAVENOUS | Status: DC
  Filled 2015-07-06: qty 3

## 2015-07-06 NOTE — Progress Notes (Signed)
Pharmacy: Re- TPN  TPN weaned off 3/6.  Patient reported no n/v since TPN was d/ced yesterday and eating about 100% of her meals.  Will continue to hold TPN for now.  Dia Sitter, PharmD, BCPS 07/06/2015 8:22 AM

## 2015-07-06 NOTE — Progress Notes (Signed)
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 Resection of cystic mass near small bowel with serosal repair.  Surgeon(s): Michael Boston, MD Leighton Ruff, MD - Assist   Assessment  RECURRENT N/V with delayed gastric emptying - improved   Plan:  -improved.  OK to d/c to SNF from surgeru standpoint  -Keep PICC.  Needs 2L IVF boluses q MWF will need that until > 8 weeks out  -DGE/gastroparesis.  Tolerating solids >50% meals w/o emesis >48hours.   On PO Zofran & Reglan. Transitioning to oral medications.  Trying Megace to stimulate appetite.  Gastroenterology signed off.  Continue calorie counts.    -Weaning off TNA.  If not able to tolerate by mouth on oral medication, SNF withTNA until surgery.  If tol PO, wean off & d/c soon  -mild ileostomy stenosis but no stricture with good output & 18Fr NGT easily intubates - doubt cause of n/v.  AXR w SB gaseous distention c/w ileus.  No colonic air because colon diverted = doubt obstruction/SBO.  ARF/dehydration - nearly resolved but poor PO  intake.  Some orthostasis by HR but not BP.   VS.     -Skin separation at pink ileostomy with skin necrosis - ostomy care.  Reflection of malnutrition. OSTOMY CARE / TRAINING.  Patient's lack of interest in changing & managing the bag a concern.  Complex packing/pouching working so far.  Large wound but granulating. >3 weeks postop = would treat as EC fistula & pouch & try to get nutrition better.  I worry attempt at ostomy revision would be even worse.  Can try to take ileostomy down if > 6 weeks & Albumin >3 to minimize risks of leak/breakdown.   Expect 3 months.  -replace lytes PRN.  Low Mag replaced  -Holding any antidiarrheal regimen as tolerated given gastroparesis.  Challenging with her nausea issues.    -VTE prophylaxis- SCDs, etc.  On full dose anticoagulation given history recurrent PE - Eliquis as tolerated.    -HTN  - No ACE Inh.  Try low-dose beta blocker given intermittent tachycardia.  -mobilize as tolerated to help recovery.  GET HER UP!  Working with physical therapy.  They agree with skilled nursing facility.  -GERD - PPI  -hyperlipidemia - Tx -Pathology - ?+margin most likely fracture of GIST exposed.  Most likely will get gleevec post-adjuvant for 3 years total per Dr Benay Spice.   D/C patient from hospital when patient meets criteria (anticipate later Monday vs Tuesday if medicine agress):  Tolerating oral intake well Ambulating well Adequate pain control without IV medications Urinating  Having flatus Disposition planning in place:  -SNF w ostomy care -  PICC  -2L IVF bolus qMWF to avoid dehydration      Adin [name], M.D., F.A.C.S. Gastrointestinal and Minimally Invasive Surgery [facility] [address] [place], [place] [address] [phone] Main / Paging   07/06/2015  Subjective:  In bed.  Claims to be walking well No anorectal pain.  Scant drainage No pain at ileostomy  Appetite better - finishing =>50%  meals  No episodes of nausea or vomiting the last 48 hours  Wants to go to SNF today  Objective:  Vital signs:  Filed Vitals:   07/05/15 0515 07/05/15 1425 07/05/15 2226 07/06/15 0519  BP: 112/63 122/57 103/54 104/69  Pulse: 95 92 105 89  Temp: 98.7 F (37.1 C) 98.3 F (36.8 C) 98.6 F (37 C) 97.8 F (36.6 C)  TempSrc: Oral Oral Oral Oral  Resp: _0 Height:      Weight:      SpO2: 100% 100% 100% 100%    Last BM Date: 07/05/15  Intake/Output   Yesterday:  03/06 0701 - 03/07 0700 In: 380 [P.O.:360; I.V.:20] Out: 400 [Urine:300; Stool:100] This shift:     Bowel function:  Flatus: Yes  BM: thick effluent in bag.   Drain: n/a  Physical Exam:  General: Pt awake/alert/oriented x4 in no acute distress.  Sitting in bed.  Calm.  Smiling, alert Eyes: PERRL, normal EOM.  Sclera clear.  No icterus Neuro: CN II-XII intact w/o focal sensory/motor deficits. Lymph: No head/neck/groin lymphadenopathy Psych:  No delerium/psychosis/paranoia HENT: Normocephalic, Mucus membranes moist.  No thrush Neck: Supple, No tracheal deviation Chest: No chest wall pain w good excursion CV:  Pulses intact.  Regular rhythm MS: Normal AROM mjr joints.  No obvious deformity  Abdomen: Soft but morbidly obese.  Nondistended.  Nontender.  No fluctuance/cellultis.  No guarding.  No evidence of peritonitis.  No incarcerated hernias.  Loop ileostomy pink, mild thick effluent out superior orifice.  No purulence/cellulitis.  No ostomy necrosis.    Rectal deferred today Ext:  SCDs BLE.  No mjr edema.  No cyanosis Skin: No petechiae / purpura  Results:   Diagnosis 1. Soft tissue, biopsy, abdominal wall cyst ? fistula BENIGN CYST AND REACTIVE CHANGES NEGATIVE FOR MALIGNANCY 2. Soft tissue mass, simple excision, Pelvic GIST GASTROINTESTINAL STROMAL TUMORS OF THE RECTUM SHOWING TREATMENT EFFECT (9.5 CM) MARGIN OF RESECTION IS POSITIVE 3. Colon, segmental resection for tumor,  rectum DIVERTICULA NO RESIDUAL GASTROINTESTINAL STROMAL TUMOR IDENTIFIED MARGINS OF RESECTION ARE NEGATIVE FIFTEEN BENIGN LYMPH NODES (0/15) Microscopic Comment 2. GASTROINTESTINAL STROMAL TUMOR (GIST): Procedure: segmental resection Tumor Site: rectum Specify: pelvis rectum Tumor Size: 9.5 cm Tumor Focality Unifocal: focal GIST Subtype (spindle, epithelioid, mixed, etc.): spindle Mitotic Rate: 1 /50 HIGH POWER FIELD Necrosis: negative Histologic Grade 1 G1: Low grade; mitotic rate 5/50 HIGH POWER FIELD. G2: High grade; mitotic rate >5/50 HIGH POWER FIELD. Risk Assessment: _ Margins: positive Ancillary testing: Per request 1 of 3 FINAL for OTILIA, KAREEM [phone]) Microscopic Comment(continued) Lymph nodes: number examined: 15; number positive: 0 Pathologic Staging: pT3 pN0 Comments: The tumor shows treatment effect with hypocellular, atrophic spindle cells and hyalinized, fibrotic stroma. less than 1% of the tumor without treatment effect. [name] MD Pathologist, Electronic Signature (Case signed 06/10/2015) Specimen Wandalee Klang and Clinical Information Specimen(s) Obtained: 1. Soft tissue, biopsy, abdominal wall cyst ? fistula 2. Soft tissue mass, simple excision, Pelvic GIST 3. Colon, segmental resection for tumor, rectum Specimen Clinical Information 1. GIST tumor of  rectum in pelvis (kp) Doniel Maiello 1. In formalin is a a 3.1 x 1.4 x 0.3 cm irregular portion of fatty tissue, which on sectioning contains a 0.5 cm collapsed smooth cystic space. Sectioned and entirely submitted in one block. 2. Received in formalin is a 176 gram, 9.5 x 6.8 x 5.8 cm rubbery to firm mass, which has an attached suture over a 2.1 x 1.5 cm roughened area, clinically identifying anterior distal. This area is inked orange. There is also a 5.0 x 4.2 cm disrupted area, which is clinically identified as disrupted margin. This area is inked black. Also along one aspect is a 3.4 x 3.3 cm area of tan to  hyperemic possible smooth mucosa. The cut surfaces of the mass are tan to tan yellow, with vaguely whorled cut surfaces, and scattered myxoid areas. Representative sections are submitted as follows: A, B = sections from clinically disrupted margin C = sections from area identified by suture as anterior distal D, E = central sections Total: 5 blocks submitted 3. Received in formalin is a 27 cm in length segment of colon, clinically recto-sigmoid, which includes at least probable middle third of rectum. Surrounding mesorectum is incomplete and focally disrupted. There is an attached suture over the anterior perirectal surface. The mucosa is tan pink, smooth, soft with normal intestinal folds. There are no mucosal lesions identified. Within the sigmoid portion are several unperforated diverticula. Found within the surrounding fat are 15 possible lymph nodes ranging from 0.1 to 0.6 cm. Block Summary: A = proximal margin B, C = distal margin D, E = sections taken at attached suture over anterior mesorectum F = diverticulum G = four nodes, whole H = four nodes, whole I = four nodes, whole J = three nodes, whole Total: 10 blocks submitted (SW:ds 06/07/15) 2 of 3 FINAL for KEEGAN, BENSCH ([id number]) Report signed out from the following location(s) Technical component and interpretation was performed at [email or web address], [place], [place] [address]. CLIA #: [id number], 3 of 3   Labs: Results for orders placed or performed during the hospital encounter of 06/21/15 (from the past 48 hour(s))  Glucose, capillary     Status: Abnormal   Collection Time: 07/04/15 12:10 PM  Result Value Ref Range   Glucose-Capillary 112 (H) 65 - 99 mg/dL  Glucose, capillary     Status: Abnormal   Collection Time: 07/04/15  5:59 PM  Result Value Ref Range   Glucose-Capillary 101 (H) 65 - 99 mg/dL  Glucose, capillary     Status: Abnormal   Collection Time: 07/04/15  7:42 PM  Result  Value Ref Range   Glucose-Capillary 118 (H) 65 - 99 mg/dL  Glucose, capillary     Status: Abnormal   Collection Time: 07/04/15 11:59 PM  Result Value Ref Range   Glucose-Capillary 103 (H) 65 - 99 mg/dL  Glucose, capillary     Status: Abnormal   Collection Time: 07/05/15  3:57 AM  Result Value Ref Range   Glucose-Capillary 126 (H) 65 - 99 mg/dL  Comprehensive metabolic panel     Status: Abnormal   Collection Time: 07/05/15  4:34 AM  Result Value Ref Range   Sodium 135 135 - 145 mmol/L   Potassium 4.3 3.5 - 5.1 mmol/L   Chloride 108 101 - 111 mmol/L   CO2 20 (L) 22 - 32 mmol/L   Glucose, Bld 147 (H) 65 - 99 mg/dL   BUN 20 6 - 20 mg/dL   Creatinine, Ser 1.13 (  H) 0.44 - 1.00 mg/dL   Calcium 8.5 (L) 8.9 - 10.3 mg/dL   Total Protein 6.3 (L) 6.5 - 8.1 g/dL   Albumin 2.3 (L) 3.5 - 5.0 g/dL   AST 49 (H) 15 - 41 U/L   ALT 83 (H) 14 - 54 U/L   Alkaline Phosphatase 233 (H) 38 - 126 U/L   Total Bilirubin 0.5 0.3 - 1.2 mg/dL   GFR calc non Af Amer 49 (L) >60 mL/min   GFR calc Af Amer 57 (L) >60 mL/min    Comment: (NOTE) The eGFR has been calculated using the CKD EPI equation. This calculation has not been validated in all clinical situations. eGFR's persistently <60 mL/min signify possible Chronic Kidney Disease.    Anion gap 7 5 - 15  Magnesium     Status: None   Collection Time: 07/05/15  4:34 AM  Result Value Ref Range   Magnesium 1.8 1.7 - 2.4 mg/dL  Phosphorus     Status: None   Collection Time: 07/05/15  4:34 AM  Result Value Ref Range   Phosphorus 3.9 2.5 - 4.6 mg/dL  CBC     Status: Abnormal   Collection Time: 07/05/15  4:34 AM  Result Value Ref Range   WBC 8.7 4.0 - 10.5 K/uL   RBC 2.82 (L) 3.87 - 5.11 MIL/uL   Hemoglobin 7.5 (L) 12.0 - 15.0 g/dL   HCT 23.6 (L) 36.0 - 46.0 %   MCV 83.7 78.0 - 100.0 fL   MCH 26.6 26.0 - 34.0 pg   MCHC 31.8 30.0 - 36.0 g/dL   RDW 16.5 (H) 11.5 - 15.5 %   Platelets 504 (H) 150 - 400 K/uL  Differential     Status: None   Collection  Time: 07/05/15  4:34 AM  Result Value Ref Range   Neutrophils Relative % 74 %   Lymphocytes Relative 18 %   Monocytes Relative 5 %   Eosinophils Relative 3 %   Basophils Relative 0 %   Neutro Abs 6.4 1.7 - 7.7 K/uL   Lymphs Abs 1.6 0.7 - 4.0 K/uL   Monocytes Absolute 0.4 0.1 - 1.0 K/uL   Eosinophils Absolute 0.3 0.0 - 0.7 K/uL   Basophils Absolute 0.0 0.0 - 0.1 K/uL   RBC Morphology TARGET CELLS    WBC Morphology MILD LEFT SHIFT (1-5% METAS, OCC MYELO, OCC BANDS)   Triglycerides     Status: Abnormal   Collection Time: 07/05/15  4:34 AM  Result Value Ref Range   Triglycerides 183 (H) <150 mg/dL    Comment: Performed at [facility]  Prealbumin     Status: None   Collection Time: 07/05/15  4:34 AM  Result Value Ref Range   Prealbumin 28.1 18 - 38 mg/dL    Comment: Performed at [facility]  Glucose, capillary     Status: Abnormal   Collection Time: 07/05/15  8:06 AM  Result Value Ref Range   Glucose-Capillary 128 (H) 65 - 99 mg/dL  Glucose, capillary     Status: Abnormal   Collection Time: 07/05/15 11:56 AM  Result Value Ref Range   Glucose-Capillary 141 (H) 65 - 99 mg/dL  Glucose, capillary     Status: None   Collection Time: 07/05/15  4:08 PM  Result Value Ref Range   Glucose-Capillary 74 65 - 99 mg/dL  Glucose, capillary     Status: Abnormal   Collection Time: 07/05/15  7:59 PM  Result Value Ref Range   Glucose-Capillary  104 (H) 65 - 99 mg/dL  Glucose, capillary     Status: Abnormal   Collection Time: 07/05/15 11:44 PM  Result Value Ref Range   Glucose-Capillary 105 (H) 65 - 99 mg/dL  Glucose, capillary     Status: None   Collection Time: 07/06/15  3:28 AM  Result Value Ref Range   Glucose-Capillary 80 65 - 99 mg/dL      Imaging / Studies: No results found.  Medications / Allergies: per chart  Antibiotics: Anti-infectives    None        Note: Portions of this report may have been transcribed using voice recognition software. Every  effort was made to ensure accuracy; however, inadvertent computerized transcription errors may be present.   Any transcriptional errors that result from this process are unintentional.     Adin [name], M.D., F.A.C.S. Gastrointestinal and Minimally Invasive Surgery [facility] [address], [place], [place] [address] [phone] Main / Paging   07/06/2015  CARE TEAM:  PCP: Cathlean Cower, MD  Outpatient Care Team: Patient Care Team: Biagio Borg, MD as PCP - General Michael Boston, MD as Consulting Physician (General Surgery) Ladell Pier, MD as Consulting Physician (Oncology) Milus Banister, MD as Consulting Physician (Gastroenterology) Billy Fischer, MD as Consulting Physician (Family Medicine)  Inpatient Treatment Team: Treatment Team: Attending Provider: Robbie Lis, MD; Consulting Physician: Michael Boston, MD; Rounding Team: Ian Bushman, MD; Technician: Sueanne Margarita, NT; Technician: Abbe Amsterdam, NT; Technician: Parks Ranger, [place]; Registered Nurse: Bailey Mech, RN; Technician: Tenna Child, [place]; Registered Nurse: Arnold Long, RN; Technician: Coralie Carpen, NT; Registered Nurse: Celedonio Savage, RN; Registered Nurse: Cindy Hazy, RN; Registered Nurse: Michell Heinrich, RN

## 2015-07-06 NOTE — Discharge Instructions (Signed)
GETTING TO GOOD BOWEL HEALTH. Irregular bowel habits such as constipation and diarrhea can lead to many problems over time.  Having one soft bowel movement a day is the most important way to prevent further problems.  The anorectal canal is designed to handle stretching and feces to safely manage our ability to get rid of solid waste (feces, poop, stool) out of our body.  BUT, hard constipated stools can act like ripping concrete bricks and diarrhea can be a burning fire to this very sensitive area of our body, causing inflamed hemorrhoids, anal fissures, increasing risk is perirectal abscesses, abdominal pain/bloating, an making irritable bowel worse.      The goal: 4-6 SOFT BOWEL MOVEMENTs in the bag DAY!  To have soft, regular bowel movements:   Drink plenty of fluids, consider 4-6 tall glasses of water a day.   o    Controlling diarrhea o Switch to liquids and simpler foods for a few days to avoid stressing your intestines further. o Avoid dairy products (especially milk & ice cream) for a short time.  The intestines often can lose the ability to digest lactose when stressed. o Avoid foods that cause gassiness or bloating.  Typical foods include beans and other legumes, cabbage, broccoli, and dairy foods.  Every person has some sensitivity to other foods, so listen to our body and avoid those foods that trigger problems for you. o Adding fiber (Citrucel, Metamucil, psyllium, Miralax) gradually can help thicken stools by absorbing excess fluid and retrain the intestines to act more normally.  Slowly increase the dose over a few weeks.  Too much fiber too soon can backfire and cause cramping & bloating. o Probiotics (such as active yogurt, Align, etc) may help repopulate the intestines and colon with normal bacteria and calm down a sensitive digestive tract.  Most studies show it to be of mild help, though, and such products can be costly. o Medicines: - Bismuth subsalicylate (ex. Kayopectate, Pepto  Bismol) every 30 minutes for up to 6 doses can help control diarrhea.  Avoid if pregnant. - Loperamide (Immodium) can slow down diarrhea.  Start with two tablets (10m total) first and then try one tablet every 6 hours.  Avoid if you are having fevers or severe pain.  If you are not better or start feeling worse, stop all medicines and call your doctor for advice o Call your doctor if you are getting worse or not better.  Sometimes further testing (cultures, endoscopy, X-ray studies, bloodwork, etc) may be needed to help diagnose and treat the cause of the diarrhea.  TROUBLESHOOTING IRREGULAR BOWELS 1) Avoid extremes of bowel movements (no bad constipation/diarrhea) 2) Miralax 17gm mixed in 8oz. water or juice-daily. May use BID as needed.  3) Gas-x,Phazyme, etc. as needed for gas & bloating.  4) Soft,bland diet. No spicy,greasy,fried foods.  5) Prilosec over-the-counter as needed  6) May hold gluten/wheat products from diet to see if symptoms improve.  7)  May try probiotics (Align, Activa, etc) to help calm the bowels down 7) If symptoms become worse call back immediately.   Acute Kidney Injury Acute kidney injury is any condition in which there is sudden (acute) damage to the kidneys. Acute kidney injury was previously known as acute kidney failure or acute renal failure. The kidneys are two organs that lie on either side of the spine between the middle of the back and the front of the abdomen. The kidneys:  Remove wastes and extra water from the blood.  Produce important hormones. These help keep bones strong, regulate blood pressure, and help create red blood cells.   Balance the fluids and chemicals in the blood and tissues. A small amount of kidney damage may not cause problems, but a large amount of damage may make it difficult or impossible for the kidneys to work the way they should. Acute kidney injury may develop into long-lasting (chronic) kidney disease. It may also develop  into a life-threatening disease called end-stage kidney disease. Acute kidney injury can get worse very quickly, so it should be treated right away. Early treatment may prevent other kidney diseases from developing. CAUSES   A problem with blood flow to the kidneys. This may be caused by:   Blood loss.   Heart disease.   Severe burns.   Liver disease.  Direct damage to the kidneys. This may be caused by:  Some medicines.   A kidney infection.   Poisoning or consuming toxic substances.   A surgical wound.   A blow to the kidney area.   A problem with urine flow. This may be caused by:   Cancer.   Kidney stones.   An enlarged prostate. SIGNS AND SYMPTOMS   Swelling (edema) of the legs, ankles, or feet.   Tiredness (lethargy).   Nausea or vomiting.   Confusion.   Problems with urination, such as:   Painful or burning feeling during urination.   Decreased urine production.   Frequent accidents in children who are potty trained.   Bloody urine.   Muscle twitches and cramps.   Shortness of breath.   Seizures.   Chest pain or pressure. Sometimes, no symptoms are present. DIAGNOSIS Acute kidney injury may be detected and diagnosed by tests, including blood, urine, imaging, or kidney biopsy tests.  TREATMENT Treatment of acute kidney injury varies depending on the cause and severity of the kidney damage. In mild cases, no treatment may be needed. The kidneys may heal on their own. If acute kidney injury is more severe, your health care provider will treat the cause of the kidney damage, help the kidneys heal, and prevent complications from occurring. Severe cases may require a procedure to remove toxic wastes from the body (dialysis) or surgery to repair kidney damage. Surgery may involve:   Repair of a torn kidney.   Removal of an obstruction. HOME CARE INSTRUCTIONS  Follow your prescribed diet.  Take medicines only as directed  by your health care provider.  Do not take any new medicines (prescription, over-the-counter, or nutritional supplements) unless approved by your health care provider. Many medicines can worsen your kidney damage or may need to have the dose adjusted.   Keep all follow-up visits as directed by your health care provider. This is important.  Observe your condition to make sure you are healing as expected. SEEK IMMEDIATE MEDICAL CARE IF:  You are feeling ill or have severe pain in the back or side.   Your symptoms return or you have new symptoms.  You have any symptoms of end-stage kidney disease. These include:   Persistent itchiness.   Loss of appetite.   Headaches.   Abnormally dark or light skin.  Numbness in the hands or feet.   Easy bruising.   Frequent hiccups.   Menstruation stops.   You have a fever.  You have increased urine production.  You have pain or bleeding when urinating. MAKE SURE YOU:   Understand these instructions.  Will watch your condition.  Will get help right  away if you are not doing well or get worse.   This information is not intended to replace advice given to you by your health care provider. Make sure you discuss any questions you have with your health care provider.   Document Released: 10/31/2010 Document Revised: 05/08/2014 Document Reviewed: 12/15/2011 Elsevier Interactive Patient Education 2016 [place] heard that people get along just fine with only one of their eyes, or one of their lungs, or one of their kidneys. But you also know that you have only one intestine and only one bladder, and that leaves you feeling awfully empty, both physically and emotionally: You think no other people go around without part of their intestine with the ends of their intestines sticking out through their abdominal walls.   YOU ARE NOT ALONE.  There are nearly three quarters of a million people  in the [place] who have an ostomy; people who have had surgery to remove all or part of their colons or bladders.   There is even a national association, the [place] [employer] [place] with over 350 local affiliated support groups that are organized by volunteers who provide peer support and counseling. [place] has a toll free telephone num-ber, [phone] and an educational, interactive website, www.ostomy.org   An ostomy is an opening in the belly (abdominal wall) made by surgery. Ostomates are people who have had this procedure. The opening (stoma) allows the kidney or bowel to discharge waste. An external pouch covers the stoma to collect waste. Pouches are are a simple bag and are odor free. Different companies have disposable or reusable pouches to fit one's lifestyle. An ostomy can either be temporary or permanent.   THERE ARE THREE MAIN TYPES OF OSTOMIES  Colostomy. A colostomy is a surgically created opening in the large intestine (colon).  Ileostomy. An ileostomy is a surgically created opening in the small intestine.  Urostomy. A urostomy is a surgically created opening to divert urine away from the bladder.  OSTOMY Care  The following guidelines will make care of your colostomy easier. Keep this information close by for quick reference.  Helpful DIET hints Eat a well-balanced diet including vegetables and fresh fruits. Eat on a regular schedule. Drink at least 6 to 8 glasses of fluids daily. Eat slowly in a relaxed atmosphere. Chew your food thoroughly. Avoid chewing gum, smoking, and drinking from a straw. This will help decrease the amount of air you swallow, which may help reduce gas. Eating yogurt or drinking buttermilk may help reduce gas.  To control gas at night, do not eat after 8 p.m. This will give your bowel time to quiet down before you go to bed.  If gas is a problem, you can purchase Beano. Sprinkle Beano on the first bite of food before eating to reduce gas.  It has no flavor and should not change the taste of your food. You can buy Beano over the counter at your local drugstore.  Foods like fish, onions, garlic, broccoli, asparagus, and cabbage produce odor. Although your pouch is odor-proof, if you eat these foods you may notice a stronger odor when emptying your pouch. If this is a concern, you may want to limit these foods in your diet.  If you have an ileostomy, you will have chronic diarrhea & need to drink more liquids to avoid getting dehydrated.  Consider antidiarrheal medicine like imodium (loperamide) or Lomotil to help slow down bowel movements / diarrhea into your  ileostomy bag.  GETTING TO GOOD BOWEL HEALTH WITH AN ILEOSTOMY . Irregular bowel habits such as constipation and diarrhea can lead to many problems over time.  The goal: 4-6 small BOWEL MOVEMENTS A DAY!  To have soft, regular bowel movements:   Drink plenty of fluids, consider 4-6 tall glasses of water a day.    Controlling diarrhea  o Switch to liquids and simpler foods for a few days to avoid stressing your intestines further. o Avoid dairy products (especially milk & ice cream) for a short time.  The intestines often can lose the ability to digest lactose when stressed. o Avoid foods that cause gassiness or bloating.  Typical foods include beans and other legumes, cabbage, broccoli, and dairy foods.  Every person has some sensitivity to other foods, so listen to our body and avoid those foods that trigger problems for you. o Adding fiber (Citrucel, Metamucil, psyllium, Miralax) gradually can help thicken stools by absorbing excess fluid and retrain the intestines to act more normally.  Slowly increase the dose over a few weeks.  Too much fiber too soon can backfire and cause cramping & bloating. o Probiotics (such as active yogurt, Align, etc) may help repopulate the intestines and colon with normal bacteria and calm down a sensitive digestive tract.  Most studies show it to be  of mild help, though, and such products can be costly. o Medicines: - Bismuth subsalicylate (ex. Kayopectate, Pepto Bismol) every 30 minutes for up to 6 doses can help control diarrhea.  Avoid if pregnant. - Loperamide (Immodium) can slow down diarrhea.  Start with two tablets (6m total) first and then try one tablet every 6 hours.  Avoid if you are having fevers or severe pain.  If you are not better or start feeling worse, stop all medicines and call your doctor for advice o Call your doctor if you are getting worse or not better.  Sometimes further testing (cultures, endoscopy, X-ray studies, bloodwork, etc) may be needed to help diagnose and treat the cause of the diarrhea.  TROUBLESHOOTING IRREGULAR BOWELS 1) Avoid extremes of bowel movements (no bad constipation/diarrhea) 2) Miralax 17gm mixed in 8oz. water or juice-daily. May use twice a day as needed  3) Gas-x,Phazyme, etc. as needed for gas & bloating.  4) Soft,bland diet. No spicy,greasy,fried foods.  5) Prilosec (omeprazole) over-the-counter as needed  6) May hold gluten/wheat products from diet to see if symptoms improve.  7)  May try probiotics (Align, Activa, etc) to help calm the bowels down 7) If symptoms become worse call back immediately.   Applying the pouching system To apply your pouch, follow these steps:  Place all your equipment close at hand before removing your pouch.  Wash your hands.  Stand or sit in front of a mirror. Use the position that works best for you. Remember that you must keep the skin around the stoma wrinkle-free for a good seal.  Gently remove the used pouch (1-piece system) or the pouch and old wafer (2-piece system). Empty the pouch into the toilet. Save the closure clip to use again.  Wash the stoma itself and the skin around the stoma. Your stoma may bleed a little when being washed. This is normal. Rinse and pat dry. You may use a wash cloth or soft paper towels (like Bounty), mild soap  (like Dial, Safeguard, or [place], and water. Avoid soaps that contain perfumes or lotions.  For a new pouch (1-piece system) or a new wafer (2-piece system), measure  your stoma using the stoma guide in each box of supplies.  Trace the shape of your stoma onto the back of the new pouch or the back of the new wafer. Cut out the opening. Remove the paper backing and set it aside.  Optional: Apply a skin barrier powder to surrounding skin if it is irritated (bare or weeping), and dust off the excess. Optional: Apply a skin-prep wipe (such as Skin Prep or All-Kare) to the skin around the stoma, and let it dry. Do not apply this solution if the skin is irritated (red, tender, or broken) or if you have shaved around the stoma. Optional: Apply a skin barrier paste (such as Stomahesive, Coloplast, or Premium) around the opening cut in the back of the pouch or wafer. Allow it to dry for 30 to 60 seconds.  Hold the pouch (1-piece system) or wafer (2-piece system) with the sticky side toward your body. Make sure the skin around the stoma is wrinkle-free. Center the opening on the stoma, then press firmly to your abdomen (Fig. 4). Look in the mirror to check if you are placing the pouch, or wafer, in the right position. For a 2-piece system, snap the pouch onto the wafer. Make sure it snaps into place securely.  Place your hand over the stoma and the pouch or wafer for about 30 seconds. The heat from your hand can help the pouch or wafer stick to your skin.  Add deodorant (such as Super Banish or Nullo) to your pouch. Other options include food extracts such as vanilla oil and peppermint extract. Add about 10 drops of the deodorant to the pouch. Then apply the closure clamp. Note: Do not use toxic  chemicals or commercial cleaning agents in your pouch. These substances may harm the stoma.  Optional: For extra seal, apply tape to all 4 sides around the pouch or wafer, as if you were framing a picture. You may use  any brand of medical adhesive tape. Change your pouch every 5 to 7 days. Change it immediately if a leak occurs.  Wash your hands afterwards.  If you are wearing a 2-piece system, you may use 2 new pouches per week and alternate them. Rinse the pouch with mild soap and warm water and hang it to dry for the next day. Apply the fresh pouch. Alternate the 2 pouches like this for a week. After a week, change the wafer and begin with 2 new pouches. Place the old pouches in a plastic bag, and put them in the trash.    Tips for ostomy care  Applying Your Pouch You may stand or sit to apply your pouch.  Keep the skin where you apply the pouch wrinkle-free. If the skin around the pouch is wrinkled, the seal may break when your skin stretches.  If hair grows close to your stoma, you may trim off the hair with scissors, an electric razor, or a safety razor.  Always have a mirror nearby so you can get a better view of your stoma.  When you apply a new pouch, write the date on the adhesive tape. This will remind you of when you last changed your pouch.  Changing Your Pouch The best time to change your pouch is in the morning, before eating or drinking anything. Your stoma can function at any time, but it will function more after eating or drinking.  Emptying Your Pouch Empty your pouch when it is one-third full (of urine, stool, and/or gas). If  you wait until your pouch is fuller than this, it will be more difficult to empty and more noticeable. When you empty your pouch, either put toilet paper in the toilet bowl first, or flush the toilet while you empty the pouch. This will reduce splashing. You can empty the pouch between your legs or to one side while sitting, or while standing or stooping. If you have a 2-piece system, you can snap off the pouch to empty it. Remember that your stoma may function during this time. If you wish to rinse your pouch after you empty it, a [place] baster can be helpful.  When using a baster, squirt water up into the pouch through the opening at the bottom. With a 2-piece system, you can snap off the pouch to rinse it. After rinsing  your pouch, empty it into the toilet. When rinsing your pouch at home, put a few granules of Dreft soap in the rinse water. This helps lubricate and freshen your pouch. The inside of your pouch can be sprayed with non-stick cooking oil (Pam spray). This may help reduce stool sticking to the inside of the pouch.  Bathing You may shower or bathe with your pouch on or off. Remember that your stoma may function during this time.  The materials you use to wash your stoma and the skin around it should be clean, but they do not need to be sterile.  Wearing Your Pouch During hot weather, or if you perspire a lot in general, wear a cover over your pouch. This may prevent a rash on your skin under the pouch. Pouch covers are sold at ostomy supply stores. Wear the pouch inside your underwear for better support. Watch your weight. Any gain or loss of 10 to 15 pounds or more can change the way your pouch fits.  Going Away From Home A collapsible cup (like those that come in travel kits) or a soft plastic squirt bottle with a pull-up top (like a travel bottle for shampoo) can be used for rinsing your pouch when you are away from home. Tilt the opening of the pouch at an upward angle when using a cup to rinse.  Carry wet wipes or extra tissues to use in public bathrooms.  Carry an extra pouching system with you at all times.  Never keep ostomy supplies in the glove compartment of your car. Extreme heat or cold can damage the skin barriers and adhesive wafers on the pouch.  When you travel, carry your ostomy supplies with you at all times. Keep them within easy reach. Do not pack ostomy supplies in baggage that will be checked or otherwise separated from you, because your baggage might be lost. If youre traveling out of the country, it is  helpful to have a letter stating that you are carrying ostomy supplies as a medical necessity.  If you need ostomy supplies while traveling, look in the yellow pages of the telephone book under Surgical Supplies. Or call the local ostomy organization to find out where supplies are available.  Do not let your ostomy supplies get low. Always order new pouches before you use the last one.  Reducing Odor Limit foods such as broccoli, cabbage, onions, fish, and garlic in your diet to help reduce odor. Each time you empty your pouch, carefully clean the opening of the pouch, both inside and outside, with toilet paper. Rinse your pouch 1 or 2 times daily after you empty it (see directions for emptying your pouch and  going away from home). Add deodorant (such as Super Banish or Nullo) to your pouch. Use air deodorizers in your bathroom. Do not add aspirin to your pouch. Even though aspirin can help prevent odor, it could cause ulcers on your stoma.  When to call the doctor Call the doctor if you have any of the following symptoms: Purple, black, or white stoma Severe cramps lasting more than 6 hours Severe watery discharge from the stoma lasting more than 6 hours No output from the colostomy for 3 days Excessive bleeding from your stoma Swelling of your stoma to more than 1/2-inch larger than usual Pulling inward of your stoma below skin level Severe skin irritation or deep ulcers Bulging or other changes in your abdomen  When to call your ostomy nurse Call your ostomy/enterostomal therapy (ET) nurse if any of the following occurs: Frequent leaking of your pouching system Change in size or appearance of your stoma, causing discomfort or problems with your pouch Skin rash or rawness Weight gain or loss that causes problems with your pouch      FREQUENTLY ASKED QUESTIONS   Why havent you met any of these folks who have an ostomy?  Well, maybe you have! You just did not recognize  them because an ostomy doesn't show. It can be kept secret if you wish. Why, maybe some of your best friends, office associates or neighbors have an ostomy ... you never can tell.   People facing ostomy surgery have many quality-of-life questions like:  Will you bulge? Smell? Make noises? Will you feel waste leaving your body? Will you be a captive of the toilet? Will you starve? Be a social outcast? Get/stay married? Have babies? Easily bathe, go swimming, bend over?  OK, lets look at what you can expect:   Will you bulge?  Remember, without part of the intestine or bladder, and its contents, you should have a flatter tummy than before. You can expect to wear, with little exception, what you wore before surgery ... and this in-cludes tight clothing and bathing suits.   Will you smell?  Today, thanks to modern odor proof pouching systems, you can walk into an ostomy support group meeting and not smell anything that is foul or offensive. And, for those with an ileostomy or colostomy who are concerned about odor when emptying their pouch, there are in-pouch deodorants that can be used to eliminate any waste odors that may exist.   Will you make noises?  Everyone produces gas, especially if they are an air-swallower. But intestinal sounds that occur from time to time are no differ-ent than a gurgling tummy, and quite often your clothing will muffle any sounds.   Will you feel the waste discharges?  For those with a colostomy or ileostomy there might be a slight pressure when waste leaves your body, but understand that the intestines have no nerve endings, so there will be no unpleasant sensations. Those with a urostomy will probably be unaware of any kidney drainage.   Will you be a captive of the toilet?  Immediately post-op you will spend more time in the bathroom than you will after your body recovers from surgery. Every person is different, but on average those with an ileostomy or urostomy may  empty their pouches 4 to 6 times a day; a little  less if you have a colostomy. The average wear time between pouch system changes is 3 to 5 days and the changing process should take less than 30 minutes.  Will I need to be on a special diet? Most people return to their normal diet when they have recovered from surgery. Be sure to chew your food well, eat a well-balanced diet and drink plenty of fluids. If you experience problems with a certain food, wait a couple of weeks and try it again.  Will there be odor and noises? Pouching systems are designed to be odor-proof or odor-resistant. There are deodorants that can be used in the pouch. Medications are also available to help reduce odor. Limit gas-producing foods and carbonated beverages. You will experience less gas and fewer noises as you heal from surgery.  How much time will it take to care for my ostomy? At first, you may spend a lot of time learning about your ostomy and how to take care of it. As you become more comfortable and skilled at changing the pouching system, it will take very little time to care for it.   Will I be able to return to work? People with ostomies can perform most jobs. As soon as you have healed from surgery, you should be able to return to work. Heavy lifting (more than 10 pounds) may be discouraged.   What about intimacy? Sexual relationships and intimacy are important and fulfilling aspects of your life. They should continue after ostomy surgery. Intimacy-related concerns should be discussed openly between you and your partner.   Can I wear regular clothing? You do not need to wear special clothing. Ostomy pouches are fairly flat and barely noticeable. Elastic undergarments will not hurt the stoma or prevent the ostomy from functioning.   Can I participate in sports? An ostomy should not limit your involvement in sports. Many people with ostomies are runners, skiers, swimmers or participate in other active  lifestyles. Talk with your caregiver first before doing heavy physical activity.  Will you starve?  Not if you follow doctors orders at each stage of your post-op adjustment. There is no such thing as an ostomy diet. Some people with an ostomy will be able to eat and tolerate anything; others may find diffi-culty with some foods. Each person is an individual and must determine, by trial, what is best for them. A good practice for all is to drink plenty of water.   Will you be a social outcast?  Have you met anyone who has an ostomy and is a social outcast? Why should you be the first? Only your attitude and self image will effect how you are treated. No confi-dent person is an Occupational psychologist.     PROFESSIONAL HELP  Resources are available if you need help or have questions about your ostomy.    Specially trained nurses called Wound, Ostomy Continence Nurses (WOCN) are available for consultation in most major medical centers.   Consider getting an ostomy consult with Cena Benton at [facility] to help troubleshoot stoma pouch fittings and other issues with your ostomy: [phone]   The United Ostomy Association (UOA) is a group made up of many local chapters throughout the [place]. These local groups hold meetings and provide support to prospective and existing ostomates. They sponsor educational events and have qualified visitors to make personal or telephone visits. Contact the UOA for the chapter nearest you and for other educational publications.   More detailed information can be found in Colostomy Guide, a publication of the [employer] (UOA). Contact UOA at 1-(209)067-4207 or visit their web site at https://arellano.com/. The website contains links to other sites, suppliers  and resources.   [employer] Start Services:  Start at the website to enlist for support.  Your Wound Ostomy (WOCN) nurse may have started this process.  https://www.hollister.com/en/securestart  Secure Start services are designed to support people as they live their lives with an ostomy or neurogenic bladder. Enrolling is easy and at no cost to the patient. We realize that each person's needs and life journey are different. Through Secure Start services, we want to help people live their life, their way.

## 2015-07-06 NOTE — Progress Notes (Signed)
Pt DC to [facility]. Ileostomy bag changed before DC. Pt alert and oriented times 4, VSS, no c/o pain, all other skin intact, pt was given her prescriptions and given instructions to have those filled so that upon arrival to [facility] she could give them the meds to dispense. Her sister is transporting to facility and report has been called.

## 2015-07-06 NOTE — Discharge Summary (Signed)
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 Pt presented to [facility] ED for evaluation of nausea vomiting, high ileostomy output, poor appetite and subjective fevers. General surgery consulted due to recent surgery. CT abdomen pelvis showed postsurgical changes, postsurgical ileus vs partial small bowel obstruction, small hiatal hernia. Chest x-ray was negative. Creatinine has increased from 0.98 to 5.91.  Hospital course is complicated with delayed gastric emptying which was noted on the study done 06/26/2015. Patient required TPN for nutritional support.   Hospital Course:    Assessment/Plan:    Principal Problem: Vomiting / Delayed gastric emptying / Ileus / Gastroparesis / Mild ileostomy stenosis  - Delayed gastric emptying seen on the study 06/26/2015 - Xray of the abdomen 06/28/2015 - small bowel obstruction / ?ileus - Symptoms better with Reglan 10 mg 3 times a day before meals and at bedtime, Zofran 4 mg every 6 hours, Protonix 80 mg by mouth twice a day - Diet as  tolerated  - Stopped TNA - Needs 2 L IV fludis MWF per surgery recommendation   Active Problems:  Diarrhea, blood and mucus in stool - C.diff on 2/20 was negative - GI pathogen panel negative - Diarrhea resolved    AKI (acute kidney injury) ([place]) - Prerenal  - Cr stable, 0.92 - 1.13   Essential hypertension / sinus tachycardia - Continue metoprolol 12.5 mg twice daily   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   DVT Prophylaxis  - Continue apixaban and aspirin on discharge   Code Status: Full.  Family Communication: plan of care discussed with the patient   IV access:  Peripheral IV  Procedures and diagnostic studies:   Dg Abd Acute W/chest 06/28/2015 Gaseous distended loops of small bowel within the central abdomen without distal colonic gas concerning for small bowel obstruction. Small left pleural effusion with underlying opacities favored to represent atelectasis. Gas within the soft tissues overlying the abdomen.   Nm Gastric Emptying 06/26/2015 Significantly delayed gastric emptying study.   Ct Abdomen Pelvis Wo Contrast 06/21/2015  1. Stable postsurgical changes, as described above. 2. Mildly improved postsurgical jejunal ileus or partial obstruction, with transition in the left mid abdomen. This could be an indication of an adhesion. 3. Small hiatal hernia. 4. Colonic diverticulosis.   Dg  Chest Portable 1 View 06/21/2015 Stable. No acute findings.   Dg Chest Port 1 View 06/21/2015 No acute cardiopulmonary disease.   Medical Consultants:  Surgery GI  IAnti-Infectives:   None   Signed:  Leisa Lenz, MD  [facility] 07/06/2015, 12:34 PM  Pager #: [phone]  Time spent in minutes: more than 30 minutes  Discharge Exam: Filed Vitals:   07/05/15 2226 07/06/15 0519  BP: 103/54 104/69  Pulse: 105 89  Temp: 98.6 F (37 C) 97.8 F (36.6 C)  Resp: 16 18   Filed Vitals:   07/05/15 0515 07/05/15 1425 07/05/15 2226 07/06/15 0519  BP: 112/63 122/57 103/54 104/69  Pulse: 95 92 105 89  Temp: 98.7 F (37.1 C) 98.3 F (36.8 C) 98.6 F (37 C) 97.8 F (36.6 C)  TempSrc: Oral Oral Oral Oral  Resp: 16 16 16 18   Height:      Weight:      SpO2: 100% 100% 100% 100%    General: Pt is alert, follows commands appropriately, not in acute distress Cardiovascular: Regular rate and rhythm, S1/S2 +, no murmurs Respiratory: Clear to auscultation bilaterally, no wheezing, no crackles, no rhonchi Abdominal: Soft, non tender, non distended, bowel sounds +, no guarding Extremities: no edema, no cyanosis, pulses palpable bilaterally DP and PT Neuro: Grossly nonfocal  Discharge Instructions  Discharge Instructions    Call MD for:  difficulty breathing, headache or visual disturbances    Complete by:  As directed      Call MD for:  persistant dizziness or light-headedness    Complete by:  As directed      Call MD for:  persistant nausea and vomiting    Complete by:  As directed      Call MD for:  severe uncontrolled pain    Complete by:  As directed      Diet - low sodium heart healthy    Complete by:  As directed      Diet - low sodium heart healthy    Complete by:  As directed      Increase activity slowly    Complete by:  As directed      Increase activity slowly    Complete by:  As directed             Medication List    STOP taking these  medications        GLEEVEC 400 MG tablet  Generic drug:  imatinib     omeprazole 20 MG capsule  Commonly known as:  PRILOSEC  Replaced by:  pantoprazole 40 MG tablet      TAKE these medications        apixaban 5 MG Tabs tablet  Commonly known as:  ELIQUIS  Take 1 tablet (5 mg total) by mouth 2 (two) times daily.     aspirin 81 MG tablet  Take 81 mg by mouth at bedtime.     estradiol 0.025 MG/24HR  Commonly known as:  VIVELLE-DOT  APPLY 1 PATCH ONTO THE SKIN TWO TIMES WEEKLY     ferrous sulfate 325 (65 FE) MG tablet  Take 1 tablet (325 mg total) by mouth daily.     loperamide 2 MG capsule  Commonly known as:  IMODIUM  Take 1-2 capsules (2-4 mg total) by mouth every 8 (eight) hours as needed for diarrhea or loose stools (Use if >2 BM every 8 hours).     LORazepam 0.5 MG tablet  Commonly known as:  ATIVAN  Take 1 tablet (0.5 mg total) by mouth at bedtime.     megestrol 40 MG/ML suspension  Commonly known as:  MEGACE  Take 10 mLs (400 mg total) by mouth daily.     metoCLOPramide 10 MG tablet  Commonly known as:  REGLAN  Take 1 tablet (10 mg total) by mouth 4 (four) times daily -  before meals and at bedtime.     metoprolol tartrate 25 MG tablet  Commonly known as:  LOPRESSOR  Take 0.5 tablets (12.5 mg total) by mouth 2 (two) times daily.     ondansetron 4 MG tablet  Commonly known as:  ZOFRAN  Take 1 tablet (4 mg total) by mouth every 6 (six) hours as needed for nausea.     pantoprazole 40 MG tablet  Commonly known as:  PROTONIX  Take 2 tablets (80 mg total) by mouth 2 (two) times daily.     pravastatin 20 MG tablet  Commonly known as:  PRAVACHOL  Take 1 tablet (20 mg total) by mouth daily.     prochlorperazine 5 MG tablet  Commonly known as:  COMPAZINE  Take 1 tablet (5 mg total) by mouth every 6 (six) hours as needed for nausea or vomiting.     sodium chloride 0.9 % infusion  Inject 2,000 mLs into the vein 3 (three) times a week.     traMADol 50 MG  tablet  Commonly known as:  ULTRAM  Take 1 tablet (50 mg total) by mouth every 6 (six) hours as needed for moderate pain.            Follow-up Information    Follow up with Cathlean Cower, MD.   Specialties:  Internal Medicine, Radiology   Contact information:   [place] [address] [id number]       Call Faye Ramsay, MD.   Specialty:  Internal Medicine   Why:  As needed call my cell phone [phone]   Contact information:   [address] [place] [address] [phone]       Follow up with [facility].   Why:  Home Health RN, physical therapy   Contact information:   [address] [place] [address] [phone]       Follow up with Cathlean Cower, MD. Schedule an appointment as soon as possible for a visit in 2 weeks.   Specialties:  Internal Medicine, Radiology   Why:  Follow up appt after recent hospitalization   Contact information:   [place] [address] [phone]        The results of significant diagnostics from this hospitalization (including imaging, microbiology, ancillary and laboratory) are listed below for reference.    Significant Diagnostic Studies: Ct Abdomen Pelvis Wo Contrast  06/21/2015  CLINICAL DATA:  Nausea and vomiting for the past 4 days. Status post GIST tumor removal on 06/04/2015. EXAM: CT ABDOMEN AND PELVIS WITHOUT CONTRAST TECHNIQUE: Multidetector CT imaging of the abdomen and pelvis was performed following the standard protocol without IV contrast. COMPARISON:  06/17/2015. FINDINGS: Lower chest: 3 mm left lower lobe nodule on image number 6. This has not changed significantly since 07/22/2014 and was not included on other  previous examinations. Hepatobiliary: Cholecystectomy clips.  Unremarkable liver. Pancreas: No mass or inflammatory process identified on this un-enhanced exam. Spleen: Within normal limits in size. Adrenals/Urinary  Tract: No evidence of urolithiasis or hydronephrosis. No definite mass visualized on this un-enhanced exam. Stomach/Bowel: Right lower quadrant anterior ileostomy with passage of oral contrast into the ostomy bag. Decreased soft tissue stranding and fluid in the surrounding subcutaneous fat. Soft tissue stranding mower lateral to that location is unchanged. Bilateral subcutaneous air is again demonstrated. Mildly prominent loops of jejunum with improvement. This transitions to normal caliber small bowel in the left mid abdomen, best seen on coronal image number 36. Small hiatal hernia. Scattered colonic diverticula. No evidence of appendicitis. Vascular/Lymphatic: Atheromatous coronary artery calcifications. No enlarged lymph nodes. Reproductive: Surgically absent uterus.  No adnexal masses. Other: Mild perirectal and presacral soft tissue thickening and edema and soft tissue air. Minimal additional free peritoneal air. Stable large postoperative seroma in the anterior pelvic wall, with some soft tissue density components. This measures 13.1 x 7.9 cm on image number 63. Musculoskeletal: Right hip total prosthesis. Left iliac bone island. Lumbar and lower thoracic spine degenerative changes and mild scoliosis IMPRESSION: 1. Stable postsurgical changes, as described above. 2. Mildly improved postsurgical jejunal ileus or partial obstruction, with transition in the left mid abdomen. This could be an indication of an adhesion. 3. Small hiatal hernia. 4. Colonic diverticulosis. Electronically Signed   By: Claudie Revering M.D.   On: 06/21/2015 11:19   Nm Gastric Emptying  06/26/2015  CLINICAL DATA:  Post laparoscopic an robotic resection of gist tumor. Nausea, vomiting. EXAM: NUCLEAR MEDICINE GASTRIC EMPTYING SCAN TECHNIQUE: After oral ingestion of radiolabeled meal, sequential abdominal images were obtained for 4 hours. Percentage of activity emptying the stomach was calculated at 1 hour, 2 hour, 3 hour, and 4 hours.  RADIOPHARMACEUTICALS:  2.0 mCi Tc-53m MDP labeled sulfur colloid orally COMPARISON:  CT 06/21/2015 FINDINGS: Expected location of the stomach in the left upper quadrant. Ingested meal empties the stomach slowly over the course of the study. 17% emptied at 1 hr ( normal >= 10%) 19% Emptied at 2 hr ( normal >= 40%) 13% emptied at 3 hr ( normal >= 70%) 23% emptied at 4 hr ( normal >= 90%) IMPRESSION: Significantly delayed  gastric emptying study. Electronically Signed   By: Rolm Baptise M.D.   On: 06/26/2015 13:31   Ct Abdomen Pelvis W Contrast  06/17/2015  CLINICAL DATA:  Status post low anterior resection of colon EXAM: CT ABDOMEN AND PELVIS WITH CONTRAST TECHNIQUE: Multidetector CT imaging of the abdomen and pelvis was performed using the standard protocol following bolus administration of intravenous contrast. CONTRAST:  117mL OMNIPAQUE IOHEXOL 300 MG/ML  SOLN COMPARISON:  07/22/2014, 06/15/2015 FINDINGS: Lung bases are free of acute infiltrate or sizable effusion. The gallbladder has been surgically removed. The liver, spleen, adrenal glands and pancreas are within normal limits. The kidneys demonstrate a normal enhancement pattern bilaterally. A few small cysts are seen. No obstructive changes are noted. No renal calculi are noted. Considerable air is noted within the abdominal wall related to the recent surgery. There is considerable of the high ablation of the proximal jejunum identified. The loops are fluid filled. No definitive obstructive mass is seen although transition zone is noted as seen on images 45-48 of series 2. The overall appearance is stable when compared with the plain film from 06/15/2015. Again this may represent a degree of ileus. A loop ileostomy is noted in the right mid  abdomen. A chronic seroma is again identified in the anterior abdominal wall adjacent to the area of the loop ostomy. The bladder is well distended. No free pelvic fluid is noted. No abscess or postoperative fluid  collection is identified. No bony abnormality is seen. Prior right hip replacement is noted. IMPRESSION: Considerable amount of retained air within the abdominal wall related to the recent surgery. Dilatation of the jejunum likely representing an ileus or partial small bowel obstruction. No obstructing lesion is identified. Changes consistent with the recent surgery with loop ileostomy on the right. Chronic postoperative seroma stable from multiple previous exams. No definitive abscess is seen. Electronically Signed   By: Inez Catalina M.D.   On: 06/17/2015 18:39   Dg Chest Portable 1 View  06/21/2015  CLINICAL DATA:  Nausea and vomiting since Saturday with GI stromal tumor of rectum. EXAM: PORTABLE CHEST 1 VIEW COMPARISON:  06/21/2015, earlier the same day FINDINGS: 1827 hours. Lung volumes are low without focal airspace consolidation or pulmonary edema. No evidence for pleural effusion. Cardiopericardial silhouette is at upper limits of normal for size. Right PICC line tip overlies the mid SVC. Telemetry leads overlie the chest. IMPRESSION: Stable.  No acute findings. Electronically Signed   By: Misty Stanley M.D.   On: 06/21/2015 18:50   Dg Chest Port 1 View  06/21/2015  CLINICAL DATA:  N/V today; Pt recently discharged on 2/17 after a gastrointestinal stroma tumor of distal rectum removal.; HTN; hx PE EXAM: PORTABLE CHEST - 1 VIEW COMPARISON:  07/13/2015 FINDINGS: Lungs are clear. Heart size and mediastinal contours are within normal limits. No effusion. Visualized skeletal structures are unremarkable. Stable right arm PICC, tip low SVC. Surgical clips, right upper abdomen. IMPRESSION: No acute cardiopulmonary disease. Electronically Signed   By: Lucrezia Europe M.D.   On: 06/21/2015 12:43   Dg Abd Acute W/chest  06/28/2015  CLINICAL DATA:  Patient with abdominal pain and left-sided chest pain for multiple weeks. EXAM: DG ABDOMEN ACUTE W/ 1V CHEST COMPARISON:  Chest radiograph 06/21/2015.  Abdomen CT  06/21/2015. FINDINGS: Right upper extremity PICC line tip projects over the superior vena cava. Stable cardiac and mediastinal contours. New small left pleural effusion with underlying opacities. No pneumothorax. Multiple prominent and gaseous distended loops of small bowel are demonstrated within the central abdomen. No distal colonic gas is present. Cholecystectomy clips. Right hip arthroplasty. Lumbar spine degenerative changes. Gas throughout the soft tissues overlying the lower abdomen. Right lower quadrant mesh demonstrated. IMPRESSION: Gaseous distended loops of small bowel within the central abdomen without distal colonic gas concerning for small bowel obstruction. Small left pleural effusion with underlying opacities favored to represent atelectasis. Gas within the soft tissues overlying the abdomen. Electronically Signed   By: Lovey Newcomer M.D.   On: 06/28/2015 16:58   Dg Abd Acute W/chest  06/15/2015  CLINICAL DATA:  70 year old with resection of a large a no rectal GI stromal tumor on 06/04/2015 and resection of a cystic mass involving the anterior abdominal wall attached to a loop of mid ileum with serosal repair. Acute onset of nausea and vomiting which began last night. EXAM: DG ABDOMEN ACUTE W/ 1V CHEST COMPARISON:  MRI pelvis 12/14/2014. CT abdomen and pelvis 07/22/2014. Two-view chest x-ray 12/05/2011. FINDINGS: Multiple moderate to markedly distended loops of small bowel in the upper abdomen, demonstrating air-fluid levels on the erect image. Remainder of the small bowel normal in caliber. No evidence of free intraperitoneal air on the erect image. Subcutaneous emphysema throughout the anterior  abdominal wall. Surgical mesh material in the right mid abdominal wall. Surgical clips in the right upper quadrant from prior cholecystectomy. Degenerative changes involving the lower lumbar spine. Prior right hip arthroplasty with anatomic alignment in the AP projection. Moderate degenerative changes  involving the left hip. Right arm PICC tip projects over the lower SVC. Cardiac silhouette upper normal in size to mildly enlarged, unchanged. Lungs clear. Bronchovascular markings normal. Pulmonary vascularity normal. No visible pleural effusions. No pneumothorax. IMPRESSION: 1. Partial small bowel obstruction versus severe focal ileus in the upper abdomen. Partial obstruction is favored. 2. No free intraperitoneal air. 3. Extensive subcutaneous emphysema. 4. Borderline heart size.  No acute cardiopulmonary disease. 5. Right arm PICC tip projects over the lower SVC. Electronically Signed   By: Evangeline Dakin M.D.   On: 06/15/2015 14:57    Microbiology: No results found for this or any previous visit (from the past 240 hour(s)).   Labs: Basic Metabolic Panel:  Recent Labs Lab 06/30/15 0412 07/01/15 0859 07/02/15 0617 07/03/15 0650 07/03/15 1145 07/05/15 0434  NA 135 137 138 141 135 135  K 4.4 4.2 4.3 4.5 4.2 4.3  CL 104 105 108 110 105 108  CO2 24 22 21* 24 22 20*  GLUCOSE 123* 131* 160* 143* 152* 147*  BUN 23* 28* 21* 22* 22* 20  CREATININE 1.13* 1.18* 1.19* 1.02* 0.92 1.13*  CALCIUM 8.4* 8.7* 8.5* 9.0 8.4* 8.5*  MG 1.7 1.8 1.9 2.0  --  1.8  PHOS 4.2 3.9  --  4.0  --  3.9   Liver Function Tests:  Recent Labs Lab 06/30/15 0412 07/01/15 0859 07/03/15 0650 07/03/15 1145 07/05/15 0434  AST 64* 85* 41 53* 49*  ALT 87* 127* 83* 88* 83*  ALKPHOS 206* 244* 225* 232* 233*  BILITOT 0.6 0.4 0.4 0.2* 0.5  PROT 6.3* 6.9 6.8 6.5 6.3*  ALBUMIN 2.0* 2.3* 2.2* 2.2* 2.3*   No results for input(s): LIPASE, AMYLASE in the last 168 hours. No results for input(s): AMMONIA in the last 168 hours. CBC:  Recent Labs Lab 07/05/15 0434 07/06/15 1030  WBC 8.7 9.1  NEUTROABS 6.4  --   HGB 7.5* 8.1*  HCT 23.6* 25.6*  MCV 83.7 84.8  PLT 504* 563*   Cardiac Enzymes: No results for input(s): CKTOTAL, CKMB, CKMBINDEX, TROPONINI in the last 168 hours. BNP: BNP (last 3 results) No  results for input(s): BNP in the last 8760 hours.  ProBNP (last 3 results) No results for input(s): PROBNP in the last 8760 hours.  CBG:  Recent Labs Lab 07/05/15 1959 07/05/15 2344 07/06/15 0328 07/06/15 0747 07/06/15 1147  GLUCAP 104* 105* 80 87 99

## 2015-07-06 NOTE — Clinical Social Work Placement (Signed)
Patient is set to discharge to [facility] today. Patient & sister, Vaughan Basta at bedside aware. Sister to transport to SNF once blood transfusion is complete. RN, Janett Billow aware.     Raynaldo Opitz, [facility] Clinical Social Worker cell #: [phone]    CLINICAL SOCIAL WORK PLACEMENT  NOTE  Date:  07/06/2015  Patient Details  Name: Brianna Walker MRN: [id number] Date of Birth: 1945/09/16  Clinical Social Work is seeking post-discharge placement for this patient at the [place] level of care (*CSW will initial, date and re-position this form in  chart as items are completed):  Yes   Patient/family provided with [facility] Work Department's list of facilities offering this level of care within the geographic area requested by the patient (or if unable, by the patient's family).  Yes   Patient/family informed of their freedom to choose among providers that offer the needed level of care, that participate in Medicare, Medicaid or managed care program needed by the patient, have an available bed and are willing to accept the patient.  Yes   Patient/family informed of [facility]'s ownership interest in [facility] and [facility], as well as of the fact that they are under no obligation to receive care at these facilities.  PASRR submitted to EDS on 06/28/15     PASRR number received on 06/28/15     Existing PASRR number confirmed on       FL2 transmitted to all facilities in geographic area requested by pt/family on 06/28/15     FL2 transmitted to all facilities within larger geographic area on       Patient informed that his/her managed care company has contracts with or will negotiate with certain facilities, including the following:        Yes   Patient/family informed of bed offers received.  Patient chooses bed at [facility]     Physician recommends and patient chooses bed at      Patient  to be transferred to [facility] on 07/06/15.  Patient to be transferred to facility by patient's sister      Patient family notified on 07/06/15 of transfer.  Name of family member notified:  patient's sister at bedside     PHYSICIAN       Additional Comment:    _______________________________________________ Standley Brooking, LCSW 07/06/2015, 3:33 PM

## 2015-07-07 DIAGNOSIS — Z8611 Personal history of tuberculosis: Secondary | ICD-10-CM | POA: Diagnosis not present

## 2015-07-07 LAB — TYPE AND SCREEN
ABO/RH(D): O POS
ANTIBODY SCREEN: NEGATIVE
Unit division: 0

## 2015-07-08 ENCOUNTER — Telehealth: Payer: Self-pay

## 2015-07-08 ENCOUNTER — Telehealth: Payer: Self-pay | Admitting: *Deleted

## 2015-07-08 DIAGNOSIS — I10 Essential (primary) hypertension: Secondary | ICD-10-CM | POA: Diagnosis not present

## 2015-07-08 DIAGNOSIS — E43 Unspecified severe protein-calorie malnutrition: Secondary | ICD-10-CM | POA: Diagnosis not present

## 2015-07-08 DIAGNOSIS — Z433 Encounter for attention to colostomy: Secondary | ICD-10-CM | POA: Diagnosis not present

## 2015-07-08 DIAGNOSIS — D649 Anemia, unspecified: Secondary | ICD-10-CM | POA: Diagnosis not present

## 2015-07-08 NOTE — Telephone Encounter (Signed)
Called pt to get her set-up for TCM appt. Per son once mom was d/c they sent her to [facility], he was not sure how long she is going to be iin there. Inform him once she has been d/c from rehab they will make her a f/u appt w/Dr. Jenny Reichmann...Johny Chess

## 2015-07-08 NOTE — Telephone Encounter (Signed)
I am not sure how to respond to this,  I think is FYI only?

## 2015-07-08 NOTE — Telephone Encounter (Signed)
Correct

## 2015-07-08 NOTE — Telephone Encounter (Signed)
Pt is on TCM list. D/c from hospital on 07/06/2015 for AKI.   There have been some medication changes.  Was not able to leave a message because mail box if full.

## 2015-07-12 DIAGNOSIS — E860 Dehydration: Secondary | ICD-10-CM | POA: Diagnosis not present

## 2015-07-12 DIAGNOSIS — K3184 Gastroparesis: Secondary | ICD-10-CM | POA: Diagnosis not present

## 2015-07-16 DIAGNOSIS — K3184 Gastroparesis: Secondary | ICD-10-CM | POA: Diagnosis not present

## 2015-07-16 DIAGNOSIS — E860 Dehydration: Secondary | ICD-10-CM | POA: Diagnosis not present

## 2015-07-19 DIAGNOSIS — C49A4 Gastrointestinal stromal tumor of large intestine: Secondary | ICD-10-CM | POA: Diagnosis not present

## 2015-07-19 DIAGNOSIS — I10 Essential (primary) hypertension: Secondary | ICD-10-CM | POA: Diagnosis not present

## 2015-07-19 DIAGNOSIS — Z452 Encounter for adjustment and management of vascular access device: Secondary | ICD-10-CM | POA: Diagnosis not present

## 2015-07-19 DIAGNOSIS — Z432 Encounter for attention to ileostomy: Secondary | ICD-10-CM | POA: Diagnosis not present

## 2015-07-19 DIAGNOSIS — D649 Anemia, unspecified: Secondary | ICD-10-CM | POA: Diagnosis not present

## 2015-07-19 DIAGNOSIS — K5909 Other constipation: Secondary | ICD-10-CM | POA: Diagnosis not present

## 2015-07-19 DIAGNOSIS — K219 Gastro-esophageal reflux disease without esophagitis: Secondary | ICD-10-CM | POA: Diagnosis not present

## 2015-07-19 DIAGNOSIS — G894 Chronic pain syndrome: Secondary | ICD-10-CM | POA: Diagnosis not present

## 2015-07-19 DIAGNOSIS — D571 Sickle-cell disease without crisis: Secondary | ICD-10-CM | POA: Diagnosis not present

## 2015-07-20 DIAGNOSIS — C49A4 Gastrointestinal stromal tumor of large intestine: Secondary | ICD-10-CM | POA: Diagnosis not present

## 2015-07-21 ENCOUNTER — Encounter: Payer: Self-pay | Admitting: Physical Therapy

## 2015-07-21 ENCOUNTER — Ambulatory Visit: Payer: Commercial Managed Care - HMO | Attending: Surgery | Admitting: Physical Therapy

## 2015-07-21 DIAGNOSIS — Z452 Encounter for adjustment and management of vascular access device: Secondary | ICD-10-CM | POA: Diagnosis not present

## 2015-07-21 DIAGNOSIS — R29898 Other symptoms and signs involving the musculoskeletal system: Secondary | ICD-10-CM

## 2015-07-21 DIAGNOSIS — N8184 Pelvic muscle wasting: Secondary | ICD-10-CM | POA: Insufficient documentation

## 2015-07-21 DIAGNOSIS — N8189 Other female genital prolapse: Secondary | ICD-10-CM | POA: Insufficient documentation

## 2015-07-21 DIAGNOSIS — K5909 Other constipation: Secondary | ICD-10-CM | POA: Diagnosis not present

## 2015-07-21 DIAGNOSIS — K219 Gastro-esophageal reflux disease without esophagitis: Secondary | ICD-10-CM | POA: Diagnosis not present

## 2015-07-21 DIAGNOSIS — Z432 Encounter for attention to ileostomy: Secondary | ICD-10-CM | POA: Diagnosis not present

## 2015-07-21 DIAGNOSIS — I10 Essential (primary) hypertension: Secondary | ICD-10-CM | POA: Diagnosis not present

## 2015-07-21 DIAGNOSIS — D571 Sickle-cell disease without crisis: Secondary | ICD-10-CM | POA: Diagnosis not present

## 2015-07-21 DIAGNOSIS — R198 Other specified symptoms and signs involving the digestive system and abdomen: Secondary | ICD-10-CM | POA: Insufficient documentation

## 2015-07-21 DIAGNOSIS — D649 Anemia, unspecified: Secondary | ICD-10-CM | POA: Diagnosis not present

## 2015-07-21 DIAGNOSIS — M6289 Other specified disorders of muscle: Secondary | ICD-10-CM | POA: Diagnosis not present

## 2015-07-21 DIAGNOSIS — G894 Chronic pain syndrome: Secondary | ICD-10-CM | POA: Diagnosis not present

## 2015-07-21 DIAGNOSIS — C49A4 Gastrointestinal stromal tumor of large intestine: Secondary | ICD-10-CM | POA: Diagnosis not present

## 2015-07-21 NOTE — Patient Instructions (Signed)
Quick Contraction: Gravity Eliminated (Side-Lying)    Lie on left side, hips and knees slightly bent. Quickly squeeze then fully relax pelvic floor. Perform __1_ sets of _5__. Rest for _1__ seconds between sets. Do __4_ times a day.   Copyright  VHI. All rights reserved.  Slow Contraction: Gravity Eliminated (Side-Lying)    Lie on left side, hips and knees slightly bent. Slowly squeeze pelvic floor for _5__ seconds. Rest for _5__ seconds. Repeat 10___ times. Do _4__ times a day.   Copyright  VHI. All rights reserved.  [facility] [address], [address] , [place] [address] Phone # [phone] Fax [phone]  

## 2015-07-21 NOTE — Therapy (Signed)
[facility] [address], [place], [place], [address] Phone: [phone]   Fax:  [phone]  Physical Therapy Evaluation  Patient Details  Name: Brianna Walker MRN: [id number] Date of Birth: [birth year] Referring Provider: Dr. Michael Boston  Encounter Date: 07/21/2015      PT End of Session - 07/21/15 1104    Visit Number 1   Number of Visits 10  medicare   Date for PT Re-Evaluation 09/15/15   Authorization Type g-code on 10th visit; Kx modifier after 15th visit   PT Start Time 1015   PT Stop Time 1100   PT Time Calculation (min) 45 min   Activity Tolerance Patient tolerated treatment well   Behavior During Therapy [facility] for tasks assessed/performed      Past Medical History  Diagnosis Date  . ALLERGIC RHINITIS 09/12/2007  . BACK PAIN 06/19/2008  . Cramp of limb 08/11/2009  . Long term (current) use of anticoagulants 11/29/2010  . FREQUENCY, URINARY 10/07/2009  . GERD 12/05/2006  . HEMORRHOIDS 11/17/2006  . HIP PAIN, RIGHT, CHRONIC 08/11/2009  . HX, PERSONAL, TUBERCULOSIS 11/17/2006  . HYPERLIPIDEMIA 09/12/2007  . HYPERTENSION 11/17/2006  . INSOMNIA-SLEEP DISORDER-UNSPEC 06/19/2008  . LEG PAIN, RIGHT 06/19/2008  . MASS, SUPERFICIAL 09/12/2007  . MENOPAUSAL DISORDER 09/20/2009  . OVERACTIVE BLADDER 03/04/2010  . PULMONARY EMBOLISM, HX OF 11/17/2006    [place] [facility] s/p Pneumonia  . RECTAL BLEEDING 10/07/2007  . SCIATICA, RIGHT 09/12/2007  . SKIN LESION 07/20/2009  . TMJ SYNDROME 11/17/2006  . Iron deficiency anemia 12/07/2011  . Diverticulosis   . Hyperlipidemia 09/12/2007    Qualifier: Diagnosis of  By: Jenny Reichmann MD, Hunt Oris   . Arthritis   . Colon polyps   . DEEP VENOUS THROMBOPHLEBITIS, LEG, RIGHT 03/04/2010    right leg  . Pneumonia   . Rectal mass 06/2014  . Cancer ([place])     dx. 4'16 -oral chemotherapy only.  . Shortness of breath dyspnea     With exertion since chemotherapy tx-oral meds  . Tuberculosis      pt. was tx.    Past Surgical History  Procedure Laterality Date  . Cholecystectomy open    . Abdominal hysterectomy    . Incisional hernia repair  2002      multiple ventral hernia repair/mesh  . Tumor removed off of right shoulder Right   . S/p right hip replaced min invasive hip surgury [facility] oct 2011 Right 2011  . Hemorroid surgery  removed at dr Silvio Pate office    x 2  . Eus N/A 08/06/2014    Procedure: LOWER ENDOSCOPIC ULTRASOUND (EUS);  Surgeon: Milus Banister, MD;  Location: [facility];  Service: Endoscopy;  Laterality: N/A;  . Incisional hernia repair  01/13/2005    open w onlay Proceed mesh.  Mechele Claude robotic assisted lower anterior resection N/A 06/04/2015    Procedure: XI ROBOTIC ASSISTED LYSIS OF ADHESIONS, LOWER ANTERIOR RESECTION TRANS ABDOMINAL AND TRANS ANAL, COLOANAL HANDSEWN ANASTAOSIS, DIVERTING LOPE ILIOSTOMY, RESECTION GIST TUMOR;  Surgeon: Michael Boston, MD;  Location: [facility];  Service: General;  Laterality: N/A;  . Laparoscopic lysis of adhesions  06/04/2015    Procedure: LAPAROSCOPIC LYSIS OF ADHESIONS;  Surgeon: Michael Boston, MD;  Location: [facility];  Service: General;;    There were no vitals filed for this visit.  Visit Diagnosis:  Pelvic floor weakness - Plan: PT plan of care cert/re-cert  Pelvic floor dysfunction - Plan: PT plan of care  cert/re-cert  Weakness of both hips - Plan: PT plan of care cert/re-cert  Abdominal weakness - Plan: PT plan of care cert/re-cert      Subjective Assessment - 07/21/15 1028    Subjective Patient had robotic LAR on 06/04/2015 and has an ilioeostomy.  Patient reports she will have the ilioeostomy reveresed in April. No radiation. Patient had a pelvic tumor.  Patient  was in [facility] for 10 days and was dischareged on  3/19.  Patient was in the hospital for 35 days.    Patient Stated Goals have the sphinter muscle working   Currently in Pain? No/denies            [facility] PT Assessment - 07/21/15 0001    Assessment    Medical Diagnosis C49.44 Malignant neoplasm   Referring Provider Dr. Michael Boston   Onset Date/Surgical Date 06/04/15   Next MD Visit 08/03/2015   Prior Therapy None   Precautions   Precautions Other (comment)   Precaution Comments Cancer precautions   Balance Screen   Has the patient fallen in the past 6 months No   Has the patient had a decrease in activity level because of a fear of falling?  No   Is the patient reluctant to leave their home because of a fear of falling?  No   Prior Function   Level of Independence Independent   Vocation Retired   Leisure church   Cognition   Overall Cognitive Status Within Functional Limits for tasks assessed   Observation/Other Assessments   Focus on Therapeutic Outcomes (FOTO)  21% limitation CJ  Goal is18%   Posture/Postural Control   Posture/Postural Control Postural limitations   Postural Limitations Rounded Shoulders;Forward head;Flexed trunk   ROM / Strength   AROM / PROM / Strength AROM;Strength   AROM   Lumbar Extension decreased by 50%   Lumbar - Right Side Bend decreased by 25%   Lumbar - Left Side Bend decreased by 25%   Strength   Overall Strength Comments abdominal strength is 2/5   Right Hip Extension 3/5   Right Hip ABduction 3+/5   Left Hip Extension 3/5   Left Hip ABduction 3+/5   Ambulation/Gait   Ambulation/Gait Yes   Assistive device None   Gait Pattern Decreased dorsiflexion - right;Decreased dorsiflexion - left;Trunk flexed;Poor foot clearance - left;Poor foot clearance - right   Standardized Balance Assessment   Five times sit to stand comments  15 sec   Timed Up and Go Test   TUG Normal TUG   Normal TUG (seconds) 13   TUG Comments drags her feet                 Pelvic Floor Special Questions - 07/21/15 0001    Urinary Leakage Yes   Activities that cause leaking With strong urge  and trying to get to the bathroom   Fecal incontinence --  has ilieostomy bag   External Perineal Exam pink area  on the right anal sphinter    Pelvic Floor Internal Exam Patient confirms identification and approves PT to assess anal sphinter strength   Exam Type Rectal   Palpation decreased strength on the right side of the anal sphinter, better contraction on left, Verbal cues to not contract the gluteals   Strength weak squeeze, no lift   Strength # of reps 10   Strength # of seconds 5  PT Education - 07/21/15 1103    Education provided Yes   Education Details pelvic floor contraction in sidely   Person(s) Educated Patient   Methods Explanation;Demonstration;Verbal cues;Handout   Comprehension Returned demonstration;Verbalized understanding          PT Short Term Goals - 07/21/15 1116    PT SHORT TERM GOAL #1   Title independent with HEP for balance and flexibility exercises   Time 4   Period Weeks   Status New   PT SHORT TERM GOAL #2   Title pelvic floor strength is 3/5 with no gluteal contraction    Time 4   Period Weeks   Status New   PT SHORT TERM GOAL #3   Title understand correct toileting technique   Time 4   Period Weeks   Status New           PT Long Term Goals - 07/21/15 1117    PT LONG TERM GOAL #1   Title independent with HEP and understand how to progress herself   Time 8   Period Weeks   Status New   PT LONG TERM GOAL #2   Title pelvic floor strength >/= 4/5 so patient will have less chance of fecal incontinence after ileostomy reversal.    Time 8   Period Weeks   Status New   PT LONG TERM GOAL #3   Title ability to push therapist finger out when bearing down to facilitate the pushing of bowel with a bowel movement   Time 8   Period Weeks   Status New   PT LONG TERM GOAL #4   Title sit to stand 5 times </= 11 sec to decrease risk of falls   Time 8   Period Weeks   Status New               Plan - 07/21/15 1105    Clinical Impression Statement Patient is a 70 year old female with diagnosis of malignant neoplasm.   Patient had a pelvic tumor removed and LAR procedure with ilieostomy on 06/04/2015.  Patient had chemotherapy orally but no radiation. Patient was in the hospital for 35 days then in Rehab for 10 days.  Patient was discharged from [facility] on 07/18/2015.  Patient ilieostomy bag was leaking during therapy.  FOTO score is 21% limitation. Patient reports no pain. Sit to stand in 15 sec putting patient at risk of falling due to it shoulde be 11.4 sec or less.  TUG score is 13 seconds.  Bilateral hip extension 3/5 and abduction 3+/5 and abdominals is 2/5.  Pelvic floor strength anally is 2/5 with decreased contraction on the right side and verbal cues to not contract the gluteals.  Area on the right anal region is pink. Patient walks with decreased heel strike and flexed posture.  Patient is to have her ileostomy reversed in  April  but will need anal sphinter strength to prevent fecal incontinence.  Patient will benefit form phsysical therapy to improve strength to prepare the muscle to work when she is able to have a bowel movement.    Pt will benefit from skilled therapeutic intervention in order to improve on the following deficits Decreased endurance;Decreased strength   Rehab Potential Excellent   Clinical Impairments Affecting Rehab Potential presently has a ileostomy    PT Frequency 1x / week   PT Duration 8 weeks   PT Treatment/Interventions Biofeedback;Therapeutic activities;Therapeutic exercise;Patient/family education;Neuromuscular re-education;Manual techniques   PT Next Visit Plan balance exercises;  Pelvic floor EMG to work on strength; abdominal strength   PT Home Exercise Plan balance exercises   Recommended Other Services None   Consulted and Agree with Plan of Care Patient          G-Codes - 2015-08-14 1120    Functional Assessment Tool Used FOTO score is 21% limitation CJ  goal is 18% limitation   Functional Limitation Other PT primary   Other PT Primary Current Status [id number]) At least 20  percent but less than 40 percent impaired, limited or restricted   Other PT Primary Goal Status [id number]) At least 1 percent but less than 20 percent impaired, limited or restricted       Problem List Patient Active Problem List   Diagnosis Date Noted  . Emesis   . Delayed gastric emptying 06/28/2015  . High output ileostomy ([place]) 06/28/2015  . Necrosis of ileostomy surgical wound ([place]) 06/28/2015  . Obesity (BMI 30-39.9) 06/28/2015  . Abdominal wall seroma - chronic 06/28/2015  . Dehydration   . Protein-calorie malnutrition, severe ([place]) 06/23/2015  . Hypokalemia 06/23/2015  . AKI (acute kidney injury) ([place]) 06/21/2015  . 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 Iron deficiency anemia 12/07/2011  . Hyperlipidemia 09/12/2007    Earlie Counts, PT 2015/08/14 11:23 AM   [facility] [address] [place], [place], [address] Phone: [phone]   Fax:  [phone]  Name: TYNESHIA PASQUAL MRN: [id number] Date of Birth: [birth year]

## 2015-07-23 DIAGNOSIS — G894 Chronic pain syndrome: Secondary | ICD-10-CM | POA: Diagnosis not present

## 2015-07-23 DIAGNOSIS — K5909 Other constipation: Secondary | ICD-10-CM | POA: Diagnosis not present

## 2015-07-23 DIAGNOSIS — K219 Gastro-esophageal reflux disease without esophagitis: Secondary | ICD-10-CM | POA: Diagnosis not present

## 2015-07-23 DIAGNOSIS — C49A4 Gastrointestinal stromal tumor of large intestine: Secondary | ICD-10-CM | POA: Diagnosis not present

## 2015-07-23 DIAGNOSIS — Z452 Encounter for adjustment and management of vascular access device: Secondary | ICD-10-CM | POA: Diagnosis not present

## 2015-07-23 DIAGNOSIS — Z432 Encounter for attention to ileostomy: Secondary | ICD-10-CM | POA: Diagnosis not present

## 2015-07-23 DIAGNOSIS — I10 Essential (primary) hypertension: Secondary | ICD-10-CM | POA: Diagnosis not present

## 2015-07-23 DIAGNOSIS — D649 Anemia, unspecified: Secondary | ICD-10-CM | POA: Diagnosis not present

## 2015-07-23 DIAGNOSIS — D571 Sickle-cell disease without crisis: Secondary | ICD-10-CM | POA: Diagnosis not present

## 2015-07-24 ENCOUNTER — Other Ambulatory Visit: Payer: Self-pay | Admitting: Internal Medicine

## 2015-07-26 DIAGNOSIS — Z432 Encounter for attention to ileostomy: Secondary | ICD-10-CM | POA: Diagnosis not present

## 2015-07-26 DIAGNOSIS — G894 Chronic pain syndrome: Secondary | ICD-10-CM | POA: Diagnosis not present

## 2015-07-26 DIAGNOSIS — I10 Essential (primary) hypertension: Secondary | ICD-10-CM | POA: Diagnosis not present

## 2015-07-26 DIAGNOSIS — C49A4 Gastrointestinal stromal tumor of large intestine: Secondary | ICD-10-CM | POA: Diagnosis not present

## 2015-07-26 DIAGNOSIS — Z452 Encounter for adjustment and management of vascular access device: Secondary | ICD-10-CM | POA: Diagnosis not present

## 2015-07-26 DIAGNOSIS — Z932 Ileostomy status: Secondary | ICD-10-CM | POA: Diagnosis not present

## 2015-07-26 DIAGNOSIS — K5909 Other constipation: Secondary | ICD-10-CM | POA: Diagnosis not present

## 2015-07-26 DIAGNOSIS — K219 Gastro-esophageal reflux disease without esophagitis: Secondary | ICD-10-CM | POA: Diagnosis not present

## 2015-07-26 DIAGNOSIS — D571 Sickle-cell disease without crisis: Secondary | ICD-10-CM | POA: Diagnosis not present

## 2015-07-26 DIAGNOSIS — D481 Neoplasm of uncertain behavior of connective and other soft tissue: Secondary | ICD-10-CM | POA: Diagnosis not present

## 2015-07-26 DIAGNOSIS — D649 Anemia, unspecified: Secondary | ICD-10-CM | POA: Diagnosis not present

## 2015-07-27 ENCOUNTER — Ambulatory Visit: Payer: Commercial Managed Care - HMO | Admitting: Physical Therapy

## 2015-07-27 ENCOUNTER — Encounter: Payer: Self-pay | Admitting: Physical Therapy

## 2015-07-27 DIAGNOSIS — R198 Other specified symptoms and signs involving the digestive system and abdomen: Secondary | ICD-10-CM | POA: Diagnosis not present

## 2015-07-27 DIAGNOSIS — N8189 Other female genital prolapse: Secondary | ICD-10-CM | POA: Diagnosis not present

## 2015-07-27 DIAGNOSIS — N8184 Pelvic muscle wasting: Secondary | ICD-10-CM | POA: Diagnosis not present

## 2015-07-27 DIAGNOSIS — R29898 Other symptoms and signs involving the musculoskeletal system: Secondary | ICD-10-CM

## 2015-07-27 DIAGNOSIS — M6289 Other specified disorders of muscle: Secondary | ICD-10-CM | POA: Diagnosis not present

## 2015-07-27 NOTE — Therapy (Signed)
[facility] [address], [place], [place], [address] Phone: [phone]   Fax:  [phone]  Physical Therapy Treatment  Patient Details  Name: Brianna Walker MRN: [id number] Date of Birth: Sep 18, 1945 Referring Provider: Dr. Michael Boston  Encounter Date: 07/27/2015      PT End of Session - 07/27/15 1015    Visit Number 2   Number of Visits 10  Medicare   Date for PT Re-Evaluation 09/15/15   PT Start Time [id number]   PT Stop Time 1055   PT Time Calculation (min) 40 min   Activity Tolerance Patient tolerated treatment well   Behavior During Therapy [facility] for tasks assessed/performed      Past Medical History  Diagnosis Date  . ALLERGIC RHINITIS 09/12/2007  . BACK PAIN 06/19/2008  . Cramp of limb 08/11/2009  . Long term (current) use of anticoagulants 11/29/2010  . FREQUENCY, URINARY 10/07/2009  . GERD 12/05/2006  . HEMORRHOIDS 11/17/2006  . HIP PAIN, RIGHT, CHRONIC 08/11/2009  . HX, PERSONAL, TUBERCULOSIS 11/17/2006  . HYPERLIPIDEMIA 09/12/2007  . HYPERTENSION 11/17/2006  . INSOMNIA-SLEEP DISORDER-UNSPEC 06/19/2008  . LEG PAIN, RIGHT 06/19/2008  . MASS, SUPERFICIAL 09/12/2007  . MENOPAUSAL DISORDER 09/20/2009  . OVERACTIVE BLADDER 03/04/2010  . PULMONARY EMBOLISM, HX OF 11/17/2006    [place] [facility] s/p Pneumonia  . RECTAL BLEEDING 10/07/2007  . SCIATICA, RIGHT 09/12/2007  . SKIN LESION 07/20/2009  . TMJ SYNDROME 11/17/2006  . Iron deficiency anemia 12/07/2011  . Diverticulosis   . Hyperlipidemia 09/12/2007    Qualifier: Diagnosis of  By: Jenny Reichmann MD, Hunt Oris   . Arthritis   . Colon polyps   . DEEP VENOUS THROMBOPHLEBITIS, LEG, RIGHT 03/04/2010    right leg  . Pneumonia   . Rectal mass 06/2014  . Cancer ([place])     dx. 4'16 -oral chemotherapy only.  . Shortness of breath dyspnea     With exertion since chemotherapy tx-oral meds  . Tuberculosis     pt. was tx.    Past Surgical History  Procedure Laterality Date  .  Cholecystectomy open    . Abdominal hysterectomy    . Incisional hernia repair  2002      multiple ventral hernia repair/mesh  . Tumor removed off of right shoulder Right   . S/p right hip replaced min invasive hip surgury [facility] oct 2011 Right 2011  . Hemorroid surgery  removed at dr Silvio Pate office    x 2  . Eus N/A 08/06/2014    Procedure: LOWER ENDOSCOPIC ULTRASOUND (EUS);  Surgeon: Milus Banister, MD;  Location: [facility];  Service: Endoscopy;  Laterality: N/A;  . Incisional hernia repair  01/13/2005    open w onlay Proceed mesh.  Mechele Claude robotic assisted lower anterior resection N/A 06/04/2015    Procedure: XI ROBOTIC ASSISTED LYSIS OF ADHESIONS, LOWER ANTERIOR RESECTION TRANS ABDOMINAL AND TRANS ANAL, COLOANAL HANDSEWN ANASTAOSIS, DIVERTING LOPE ILIOSTOMY, RESECTION GIST TUMOR;  Surgeon: Michael Boston, MD;  [facility];  Service: General;  Laterality: N/A;  . Laparoscopic lysis of adhesions  06/04/2015    Procedure: LAPAROSCOPIC LYSIS OF ADHESIONS;  Surgeon: Michael Boston, MD;  [facility];  Service: General;;    There were no vitals filed for this visit.  Visit Diagnosis:  Pelvic floor weakness  Pelvic floor dysfunction  Weakness of both hips  Abdominal weakness      Subjective Assessment - 07/27/15 1016    Subjective I had my IV  treatment 3 times a week and had one treatment today.    Patient Stated Goals have the sphinter muscle working   Currently in Pain? No/denies                      Pelvic Floor Special Questions - 07/27/15 0001    Biofeedback resting tone 2.26 uv, 3quick max 6.2 uv avg. 2.63 uv; 10 sec onctraction 3.13 uv; 20 sec. contraction 3.19 uv; rsting tone 2.14 uv   Biofeedback sensor type Surface  rectal   Biofeedback Activity Quick contraction;10 second hold  assessment           [facility] Adult PT Treatment/Exercise - 07/27/15 0001    Knee/Hip Exercises: Standing   Hip Abduction Stengthening;Right;Left;10 reps  with pelvic  floor EMG   Hip Extension Stengthening;Right;Left;10 reps  with pelvic floor EMG   Other Standing Knee Exercises sit to stand with pelvic floor EMG                PT Education - 07/27/15 1057    Education provided Yes   Education Details pelvic floor contraction in sitting, hip extension, hip abduction, sit to stand with pelvic floor contraction   Person(s) Educated Patient   Methods Explanation;Demonstration;Verbal cues;Handout   Comprehension Returned demonstration;Verbalized understanding          PT Short Term Goals - 07/27/15 1100    PT SHORT TERM GOAL #1   Title independent with HEP for balance and flexibility exercises   Time 4   Period Weeks   Status On-going  still leaning   PT SHORT TERM GOAL #2   Title pelvic floor strength is 3/5 with no gluteal contraction    Time 4   Period Weeks   Status On-going   PT SHORT TERM GOAL #3   Title understand correct toileting technique   Time 4   Period Weeks   Status New           PT Long Term Goals - 07/21/15 1117    PT LONG TERM GOAL #1   Title independent with HEP and understand how to progress herself   Time 8   Period Weeks   Status New   PT LONG TERM GOAL #2   Title pelvic floor strength >/= 4/5 so patient will have less chance of fecal incontinence after ileostomy reversal.    Time 8   Period Weeks   Status New   PT LONG TERM GOAL #3   Title ability to push therapist finger out when bearing down to facilitate the pushing of bowel with a bowel movement   Time 8   Period Weeks   Status New   PT LONG TERM GOAL #4   Title sit to stand 5 times </= 11 sec to decrease risk of falls   Time 8   Period Weeks   Status New               Plan - 07/27/15 1059    Clinical Impression Statement Patient is a 70 year old female with diagnosis of malignant neoplasm.  Patient has learned to do a pelvic floor contraction with sitting for 5 sec and quick flicks.  Patient has started lower extremity  strengthening with pelvic floor contraction.  Patient is able to isolate a pelvic floor contraction. Patient will benefit from physical therapy to improve overall strength.    Pt will benefit from skilled therapeutic intervention in order to improve on the following deficits  Decreased endurance;Decreased strength   Rehab Potential Excellent   Clinical Impairments Affecting Rehab Potential presently has a ileostomy    PT Frequency 1x / week   PT Duration 8 weeks   PT Treatment/Interventions Biofeedback;Therapeutic activities;Therapeutic exercise;Patient/family education;Neuromuscular re-education;Manual techniques   PT Next Visit Plan balance exercises; Pelvic floor EMG to work on strength; abdominal strength   PT Home Exercise Plan balance exercises   Consulted and Agree with Plan of Care Patient        Problem List Patient Active Problem List   Diagnosis Date Noted  . Emesis   . Delayed gastric emptying 06/28/2015  . High output ileostomy ([place]) 06/28/2015  . Necrosis of ileostomy surgical wound ([place]) 06/28/2015  . Obesity (BMI 30-39.9) 06/28/2015  . Abdominal wall seroma - chronic 06/28/2015  . Dehydration   . Protein-calorie malnutrition, severe ([place]) 06/23/2015  . Hypokalemia 06/23/2015  . AKI (acute kidney injury) ([place]) 06/21/2015  . 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 Iron deficiency anemia 12/07/2011  . Hyperlipidemia 09/12/2007    Earlie Counts, PT 07/27/2015 11:02 AM   [facility] [address], [place], [place], [address] Phone: [phone]   Fax:  [phone]  Name: Brianna Walker MRN: [id number] Date of Birth: 1945-06-25

## 2015-07-27 NOTE — Patient Instructions (Signed)
Adduction: Hip - Knees Together (Sitting)    Sit with towel roll between knees. Push knees together. Hold for _5__ seconds. Rest for __5_ seconds. Repeat _10__ times. Do __4_ times a day.  Copyright  VHI. All rights reserved.  Quick Contraction: Gravity Resisted (Sitting)    Sitting, quickly squeeze then fully relax pelvic floor. Perform _1__ sets of _5__. Rest for __1_ seconds between sets. Do __4_ times a day.  Copyright  VHI. All rights reserved.   Sit to Stand: Phase 3    Sitting, squeeze pelvic floor and hold. Lean trunk forward. Push up on arms and stand up. Relax. Repeat _5__ times. Do __2_ times a day.  Copyright  VHI. All rights reserved.  When you get the urge to urinate the contract the pelvic floor whlie walking to the bathroom.   ABDUCTION: Standing (Active)    Stand, feet flat. Lift right leg out to side.  Complete __1_ sets of _10__ repetitions. Perform 1___ sessions per day. Hold onto counter with 2 hands. Pull up pelvic floor with exercise http://gtsc.exer.us/110   Copyright  VHI. All rights reserved.   EXTENSION: Standing (Active)    Stand, both feet flat. Draw right leg behind body as far as possible. Use 2 hands on counter. Contract pelvic floor Complete _1__ sets of _10__ repetitions. Perform _1__ sessions per day.  http://gtsc.exer.us/76   Copyright  VHI. All rights reserved.  [facility] [address], [place], [place] [address] Phone # [phone] Fax [phone]

## 2015-07-28 ENCOUNTER — Telehealth: Payer: Self-pay | Admitting: Internal Medicine

## 2015-07-28 NOTE — Telephone Encounter (Signed)
Moving hydration to tomorrow at patients request.  Then can get back on regular schedule. Requesting ok. Also would like to know what is the fasted rate they can perform hydration because patient is complaining it takes too long?

## 2015-07-28 NOTE — Telephone Encounter (Signed)
Please advise 

## 2015-07-28 NOTE — Telephone Encounter (Signed)
Ok for 100 cc /hr

## 2015-07-30 DIAGNOSIS — Z452 Encounter for adjustment and management of vascular access device: Secondary | ICD-10-CM | POA: Diagnosis not present

## 2015-07-30 DIAGNOSIS — D571 Sickle-cell disease without crisis: Secondary | ICD-10-CM | POA: Diagnosis not present

## 2015-07-30 DIAGNOSIS — G894 Chronic pain syndrome: Secondary | ICD-10-CM | POA: Diagnosis not present

## 2015-07-30 DIAGNOSIS — Z432 Encounter for attention to ileostomy: Secondary | ICD-10-CM | POA: Diagnosis not present

## 2015-07-30 DIAGNOSIS — I10 Essential (primary) hypertension: Secondary | ICD-10-CM | POA: Diagnosis not present

## 2015-07-30 DIAGNOSIS — K219 Gastro-esophageal reflux disease without esophagitis: Secondary | ICD-10-CM | POA: Diagnosis not present

## 2015-07-30 DIAGNOSIS — C49A4 Gastrointestinal stromal tumor of large intestine: Secondary | ICD-10-CM | POA: Diagnosis not present

## 2015-07-30 DIAGNOSIS — D649 Anemia, unspecified: Secondary | ICD-10-CM | POA: Diagnosis not present

## 2015-07-30 DIAGNOSIS — K5909 Other constipation: Secondary | ICD-10-CM | POA: Diagnosis not present

## 2015-08-02 ENCOUNTER — Other Ambulatory Visit: Payer: Self-pay

## 2015-08-02 ENCOUNTER — Telehealth: Payer: Self-pay | Admitting: Internal Medicine

## 2015-08-02 DIAGNOSIS — C49A4 Gastrointestinal stromal tumor of large intestine: Secondary | ICD-10-CM | POA: Diagnosis not present

## 2015-08-02 DIAGNOSIS — K5909 Other constipation: Secondary | ICD-10-CM | POA: Diagnosis not present

## 2015-08-02 DIAGNOSIS — Z452 Encounter for adjustment and management of vascular access device: Secondary | ICD-10-CM | POA: Diagnosis not present

## 2015-08-02 DIAGNOSIS — I10 Essential (primary) hypertension: Secondary | ICD-10-CM | POA: Diagnosis not present

## 2015-08-02 DIAGNOSIS — G894 Chronic pain syndrome: Secondary | ICD-10-CM | POA: Diagnosis not present

## 2015-08-02 DIAGNOSIS — D649 Anemia, unspecified: Secondary | ICD-10-CM | POA: Diagnosis not present

## 2015-08-02 DIAGNOSIS — D481 Neoplasm of uncertain behavior of connective and other soft tissue: Secondary | ICD-10-CM | POA: Diagnosis not present

## 2015-08-02 DIAGNOSIS — K219 Gastro-esophageal reflux disease without esophagitis: Secondary | ICD-10-CM | POA: Diagnosis not present

## 2015-08-02 DIAGNOSIS — Z932 Ileostomy status: Secondary | ICD-10-CM | POA: Diagnosis not present

## 2015-08-02 DIAGNOSIS — D571 Sickle-cell disease without crisis: Secondary | ICD-10-CM | POA: Diagnosis not present

## 2015-08-02 DIAGNOSIS — Z432 Encounter for attention to ileostomy: Secondary | ICD-10-CM | POA: Diagnosis not present

## 2015-08-02 NOTE — Patient Outreach (Signed)
[facility]) Care Management  08/02/2015  Brianna Walker Oct 28, 1945 [id number]  Telephone call to patient regarding [facility] referral. Unable to reach patient. HIPAA compliant voice message left with call back phone number.   PLAN:  RNCM will attempt 2nd telephone call to patient within 1 week.  Quinn Plowman RN,BSN,CCM [facility] Telephonic  [phone]

## 2015-08-02 NOTE — Telephone Encounter (Signed)
Patient is receiving iv fluids three times a week.  Patient states since Friday she is having some blurred vision in right eye and this has not happened before.  States vitals are all stable.

## 2015-08-03 ENCOUNTER — Other Ambulatory Visit: Payer: Self-pay

## 2015-08-03 ENCOUNTER — Other Ambulatory Visit: Payer: Self-pay | Admitting: Surgery

## 2015-08-03 NOTE — Patient Outreach (Signed)
[facility]) Care Management  08/03/2015  Brianna Walker July 09, 1945 [id number]  Second telephone call to patient regarding [facility] referral. Unable to reach patient HIPAA compliant voice message left with call back phone number.   PLAN:  RNCM will attempt 3rd telephone call to patient within 1 week.  Quinn Plowman RN,BSN,CCM [facility] Telephonic  [phone]

## 2015-08-05 ENCOUNTER — Encounter: Payer: Self-pay | Admitting: Physical Therapy

## 2015-08-05 ENCOUNTER — Ambulatory Visit: Payer: Commercial Managed Care - HMO | Attending: Surgery | Admitting: Physical Therapy

## 2015-08-05 ENCOUNTER — Other Ambulatory Visit: Payer: Self-pay

## 2015-08-05 DIAGNOSIS — H5210 Myopia, unspecified eye: Secondary | ICD-10-CM | POA: Diagnosis not present

## 2015-08-05 DIAGNOSIS — H5213 Myopia, bilateral: Secondary | ICD-10-CM | POA: Diagnosis not present

## 2015-08-05 DIAGNOSIS — M6281 Muscle weakness (generalized): Secondary | ICD-10-CM | POA: Insufficient documentation

## 2015-08-05 NOTE — Patient Instructions (Signed)
Toileting Techniques for Bowel Movements (Defecation) Using your belly (abdomen) and pelvic floor muscles to have a bowel movement is usually instinctive.  Sometimes people can have problems with these muscles and have to relearn proper defecation (emptying) techniques.  If you have weakness in your muscles, organs that are falling out, decreased sensation in your pelvis, or ignore your urge to go, you may find yourself straining to have a bowel movement.  You are straining if you are: . holding your breath or taking in a huge gulp of air and holding it  . keeping your lips and jaw tensed and closed tightly . turning red in the face because of excessive pushing or forcing . developing or worsening your  hemorrhoids . getting faint while pushing . not emptying completely and have to defecate many times a day  If you are straining, you are actually making it harder for yourself to have a bowel movement.  Many people find they are pulling up with the pelvic floor muscles and closing off instead of opening the anus. Due to lack pelvic floor relaxation and coordination the abdominal muscles, one has to work harder to push the feces out.  Many people have never been taught how to defecate efficiently and effectively.  Notice what happens to your body when you are having a bowel movement.  While you are sitting on the toilet pay attention to the following areas: . Jaw and mouth position . Angle of your hips   . Whether your feet touch the ground or not . Arm placement  . Spine position . Waist . Belly tension . Anus (opening of the anal canal)  An Evacuation/Defecation Plan   Here are the 4 basic points:  1. Lean forward enough for your elbows to rest on your knees 2. Support your feet on the floor or use a low stool if your feet don't touch the floor  3. Push out your belly as if you have swallowed a beach ball-you should feel a widening of your waist 4. Open and relax your pelvic floor muscles,  rather than tightening around the anus      The following conditions my require modifications to your toileting posture:  . If you have had surgery in the past that limits your back, hip, pelvic, knee or ankle flexibility . Constipation   Your healthcare practitioner may make the following additional suggestions and adjustments:  1) Sit on the toilet  a) Make sure your feet are supported. b) Notice your hip angle and spine position-most people find it effective to lean forward or raise their knees, which can help the muscles around the anus to relax  c) When you lean forward, place your forearms on your thighs for support  2) Relax suggestions a) Breath deeply in through your nose and out slowly through your mouth as if you are smelling the flowers and blowing out the candles. b) To become aware of how to relax your muscles, contracting and releasing muscles can be helpful.  Pull your pelvic floor muscles in tightly by using the image of holding back gas, or closing around the anus (visualize making a circle smaller) and lifting the anus up and in.  Then release the muscles and your anus should drop down and feel open. Repeat 5 times ending with the feeling of relaxation. c) Keep your pelvic floor muscles relaxed; let your belly bulge out. d) The digestive tract starts at the mouth and ends at the anal opening, so be   sure to relax both ends of the tube.  Place your tongue on the roof of your mouth with your teeth separated.  This helps relax your mouth and will help to relax the anus at the same time.  3) Empty (defecation) a) Keep your pelvic floor and sphincter relaxed, then bulge your anal muscles.  Make the anal opening wide.  b) Stick your belly out as if you have swallowed a beach ball. c) Make your belly wall hard using your belly muscles while continuing to breathe. Doing this makes it easier to open your anus. d) Breath out and give a grunt (or try using other sounds such as  ahhhh, shhhhh, ohhhh or grrrrrrr).  4) Finish a) As you finish your bowel movement, pull the pelvic floor muscles up and in.  This will leave your anus in the proper place rather than remaining pushed out and down. If you leave your anus pushed out and down, it will start to feel as though that is normal and give you incorrect signals about needing to have a bowel movement.    Brianna Walker, PT [facility] [address] [place], [place] [address]  Sitting   Use a towel to wrap around your abdomin. Sit comfortably. Allow body's muscles to relax. Place hands on belly. Inhale slowly and deeply for _3__ seconds, into towel. Then take __3_ seconds to exhale. Repeat _10__ times. Do _2__ times a day.  Copyright  VHI. All rights reserved.  Bracing With Bridging (Hook-Lying)    With neutral spine, tighten pelvic floor and abdominals and hold. Lift bottom. Hold 5 sec.  Repeat _10__ times. Do _1__ times a day.   Copyright  VHI. All rights reserved.    Isometric Hold With Pelvic Floor (Hook-Lying)    Lie with hips and knees bent. Slowly inhale, and then exhale. Pull navel toward spine and tighten pelvic floor. Hold for _5__ seconds. Continue to breathe in and out during hold. Rest for _5__ seconds. Repeat _10__ times. Do __1_ times a day.   Copyright  VHI. All rights reserved.  Bracing With Knee Fallout (Hook-Lying)    With neutral spine, tighten pelvic floor and abdominals and hold. Alternating legs, drop knee out to side. Keep opposite hip still. Repeat _10__ times. Do _1__ times a day.   Copyright  VHI. All rights reserved.  [facility] [address], [place], [place] [address] Phone # [phone] Fax [phone]

## 2015-08-05 NOTE — Therapy (Addendum)
[facility] [address] [place], [place], [address] Phone: [phone]   Fax:  [phone]  Physical Therapy Treatment  Patient Details  Name: Brianna Walker MRN: [id number] Date of Birth: [birth year] Referring Provider: Dr. Michael Boston  Encounter Date: 08/05/2015      PT End of Session - 08/05/15 1052    Visit Number 3   Number of Visits 10  Medicare   Date for PT Re-Evaluation 09/15/15   Authorization Type g-code on 10th visit; Kx modifier after 15th visit   PT Start Time 1015   PT Stop Time 1053   PT Time Calculation (min) 38 min   Activity Tolerance Patient tolerated treatment well   Behavior During Therapy [facility] for tasks assessed/performed      Past Medical History  Diagnosis Date  . ALLERGIC RHINITIS 09/12/2007  . BACK PAIN 06/19/2008  . Cramp of limb 08/11/2009  . Long term (current) use of anticoagulants 11/29/2010  . FREQUENCY, URINARY 10/07/2009  . GERD 12/05/2006  . HEMORRHOIDS 11/17/2006  . HIP PAIN, RIGHT, CHRONIC 08/11/2009  . HX, PERSONAL, TUBERCULOSIS 11/17/2006  . HYPERLIPIDEMIA 09/12/2007  . HYPERTENSION 11/17/2006  . INSOMNIA-SLEEP DISORDER-UNSPEC 06/19/2008  . LEG PAIN, RIGHT 06/19/2008  . MASS, SUPERFICIAL 09/12/2007  . MENOPAUSAL DISORDER 09/20/2009  . OVERACTIVE BLADDER 03/04/2010  . PULMONARY EMBOLISM, HX OF 11/17/2006    [facility] s/p Pneumonia  . RECTAL BLEEDING 10/07/2007  . SCIATICA, RIGHT 09/12/2007  . SKIN LESION 07/20/2009  . TMJ SYNDROME 11/17/2006  . Iron deficiency anemia 12/07/2011  . Diverticulosis   . Hyperlipidemia 09/12/2007    Qualifier: Diagnosis of  By: Jenny Reichmann MD, Hunt Oris   . Arthritis   . Colon polyps   . DEEP VENOUS THROMBOPHLEBITIS, LEG, RIGHT 03/04/2010    right leg  . Pneumonia   . Rectal mass 06/2014  . Cancer ([place])     dx. 4'16 -oral chemotherapy only.  . Shortness of breath dyspnea     With exertion since chemotherapy tx-oral meds  . Tuberculosis    pt. was tx.    Past Surgical History  Procedure Laterality Date  . Cholecystectomy open    . Abdominal hysterectomy    . Incisional hernia repair  2002      multiple ventral hernia repair/mesh  . Tumor removed off of right shoulder Right   . S/p right hip replaced min invasive hip surgury [facility] oct 2011 Right 2011  . Hemorroid surgery  removed at dr Silvio Pate office    x 2  . Eus N/A 08/06/2014    Procedure: LOWER ENDOSCOPIC ULTRASOUND (EUS);  Surgeon: Milus Banister, MD;  Location: [facility];  Service: Endoscopy;  Laterality: N/A;  . Incisional hernia repair  01/13/2005    open w onlay Proceed mesh.  Mechele Claude robotic assisted lower anterior resection N/A 06/04/2015    Procedure: XI ROBOTIC ASSISTED LYSIS OF ADHESIONS, LOWER ANTERIOR RESECTION TRANS ABDOMINAL AND TRANS ANAL, COLOANAL HANDSEWN ANASTAOSIS, DIVERTING LOPE ILIOSTOMY, RESECTION GIST TUMOR;  Surgeon: Michael Boston, MD;  Location: [facility];  Service: General;  Laterality: N/A;  . Laparoscopic lysis of adhesions  06/04/2015    Procedure: LAPAROSCOPIC LYSIS OF ADHESIONS;  Surgeon: Michael Boston, MD;  Location: [facility];  Service: General;;    There were no vitals filed for this visit.  Visit Diagnosis:  Muscle weakness (generalized)      Subjective Assessment - 08/05/15 1019    Subjective I cannot see out of  right eye.  I am seeing the doctor today and i think I have a sinus  infection. When I go to the bathroom I can feel it.  I am able to stop my urine when I urinate.    Patient Stated Goals have the sphinter muscle working   Currently in Pain? No/denies                         [facility] Adult PT Treatment/Exercise - 08/05/15 0001    Therapeutic Activites    Therapeutic Activities ADL's   ADL's abdominal bracing with pelvic floor contraction while getting in and out of bed and up and down from chair                PT Education - 08/05/15 1045    Education provided Yes   Education Details toileting  technique, pelvic floor contraction with abdominal contraction.   Person(s) Educated Patient   Methods Explanation;Demonstration;Verbal cues;Handout   Comprehension Returned demonstration;Verbalized understanding          PT Short Term Goals - 08/05/15 1020    PT SHORT TERM GOAL #1   Title independent with HEP for balance and flexibility exercises   Time 4   Period Weeks   Status Achieved   PT SHORT TERM GOAL #2   Title pelvic floor strength is 3/5 with no gluteal contraction    Time 4   Period Weeks   Status On-going   PT SHORT TERM GOAL #3   Title understand correct toileting technique   Time 4   Period Weeks   Status Achieved           PT Long Term Goals - 07/21/15 1117    PT LONG TERM GOAL #1   Title independent with HEP and understand how to progress herself   Time 8   Period Weeks   Status New   PT LONG TERM GOAL #2   Title pelvic floor strength >/= 4/5 so patient will have less chance of fecal incontinence after ileostomy reversal.    Time 8   Period Weeks   Status New   PT LONG TERM GOAL #3   Title ability to push therapist finger out when bearing down to facilitate the pushing of bowel with a bowel movement   Time 8   Period Weeks   Status New   PT LONG TERM GOAL #4   Title sit to stand 5 times </= 11 sec to decrease risk of falls   Time 8   Period Weeks   Status New      Plan - 08/10/15 1151    Clinical Impression Statement Patient is a 70 year old female with  diagnsois of malignant neoplasm.  Patient has met her goals exept for  pelvic strength being 3/5 instead of 4/5.  Patient will continue with her  HEP to further strengthen her pelvic floor. Patient is able to demonstrate  correct pelvic relaxation as she simulates pushing a bowel movement out  and puhsing the therapist finger out. Patient is able to contract the  pelvic floor with transitional movementswalking, and steps.  TUG score has  improved to 10 sec instead of 13 sec reducing her  risk of falls. Bilateral  hip abduction is 4/5 and hip extension is 5/5.  Patient is able to  ambulate without dragging her feet and walking daily.  Patient is ready  for discharge and seeing physician for when they will reverse  the  ileostomy.    Rehab Potential Excellent   Clinical Impairments Affecting Rehab Potential presently has a ileostomy     PT Treatment/Interventions Biofeedback;Therapeutic activities;Therapeutic  exercise;Patient/family education;Neuromuscular re-education;Manual  techniques   PT Next Visit Plan Discharge to HEP   PT Home Exercise Plan Current HEP   Consulted and Agree with Plan of Care Patient     Patient will benefit from skilled therapeutic intervention in order to improve the following deficits and impairments:  Decreased endurance, Decreased strength  Visit Diagnosis: Muscle weakness (generalized)  (primary encounter diagnosis)             Problem List Patient Active Problem List   Diagnosis Date Noted  . Emesis   . Delayed gastric emptying 06/28/2015  . High output ileostomy ([place]) 06/28/2015  . Necrosis of ileostomy surgical wound ([place]) 06/28/2015  . Obesity (BMI 30-39.9) 06/28/2015  . Abdominal wall seroma - chronic 06/28/2015  . Dehydration   . Protein-calorie malnutrition, severe ([place]) 06/23/2015  . Hypokalemia 06/23/2015  . AKI (acute kidney injury) ([place]) 06/21/2015  . 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 Iron deficiency anemia 12/07/2011  . Hyperlipidemia 09/12/2007    Earlie Counts, PT 08/05/2015 10:57 AM  Earlie Counts, PT 08/10/2015 12:26 PM   [facility] [address], [place], [place], [address] Phone: [phone]   Fax:  [phone]  Name: ERSILIA BRAWLEY MRN: [id number] Date of Birth: May 30, 1945

## 2015-08-05 NOTE — Patient Outreach (Signed)
[facility]) Care Management  08/05/2015  Brianna Walker May 21, 1945 [id number]   Telephone call to patient regarding [facility] referral. Patient states she is at a doctors appointment. Patient states she has the number and she will call RNCM back.  PLAN;  RNCM will send patient outreach letter to attempt contact.   Quinn Plowman RN,BSN,CCM [facility] Telephonic  [phone]

## 2015-08-06 ENCOUNTER — Other Ambulatory Visit (HOSPITAL_COMMUNITY): Payer: Self-pay | Admitting: Surgery

## 2015-08-06 ENCOUNTER — Other Ambulatory Visit: Payer: Self-pay

## 2015-08-06 DIAGNOSIS — C49A4 Gastrointestinal stromal tumor of large intestine: Secondary | ICD-10-CM

## 2015-08-06 NOTE — Patient Outreach (Signed)
[place] [facility]) Care Management  08/06/2015  Brianna Walker 07-17-1945 [id number]  Voice mail message received from patient. RNCM returned telephone call to patient. Unable to reach on listed home number. HIPAA compliant voice message left with call back phone number. Attempted to reach patient at listed mobile number. Message states patient is not available.   PLAN:  RNCM will await return call from patient. If no return call RNCM will attempt telephone call to patient within 1 week.  Quinn Plowman RN,BSN,CCM [facility] Telephonic  [phone]

## 2015-08-06 NOTE — Patient Outreach (Signed)
[facility]) Care Management  08/06/2015  Brianna Walker [birth year] [id number]  SUBJECTIVE:  Return telephone call to patient regarding [facility] referral. HIPAA verified with patient.  Discussed and offered [facility] care management services. Patient refused services. Patient states she is managing her care, getting to her appointments and is able to afford her medications. Patient verbally agreed to receive [facility] care management outreach letter and brochure.   PLAN:  RNCM will refer patient to Josepha Pigg to close due to refusal of services.  RNCM will notify patients primary MD of refusal of services.   Quinn Plowman RN,BSN,CCM [facility] Telephonic  [phone]

## 2015-08-09 ENCOUNTER — Ambulatory Visit: Payer: Self-pay

## 2015-08-10 ENCOUNTER — Encounter: Payer: Self-pay | Admitting: Physical Therapy

## 2015-08-10 ENCOUNTER — Ambulatory Visit: Payer: Commercial Managed Care - HMO | Admitting: Physical Therapy

## 2015-08-10 ENCOUNTER — Encounter (HOSPITAL_COMMUNITY): Payer: Self-pay | Admitting: Emergency Medicine

## 2015-08-10 ENCOUNTER — Emergency Department (HOSPITAL_COMMUNITY)
Admission: EM | Admit: 2015-08-10 | Discharge: 2015-08-10 | Disposition: A | Discharge status: Home / Self Care | Payer: Commercial Managed Care - HMO | Attending: Emergency Medicine | Admitting: Emergency Medicine

## 2015-08-10 ENCOUNTER — Emergency Department (HOSPITAL_COMMUNITY): Payer: Commercial Managed Care - HMO

## 2015-08-10 DIAGNOSIS — H43821 Vitreomacular adhesion, right eye: Secondary | ICD-10-CM | POA: Diagnosis not present

## 2015-08-10 DIAGNOSIS — M6281 Muscle weakness (generalized): Secondary | ICD-10-CM

## 2015-08-10 DIAGNOSIS — Z8701 Personal history of pneumonia (recurrent): Secondary | ICD-10-CM | POA: Insufficient documentation

## 2015-08-10 DIAGNOSIS — Z87891 Personal history of nicotine dependence: Secondary | ICD-10-CM | POA: Insufficient documentation

## 2015-08-10 DIAGNOSIS — E785 Hyperlipidemia, unspecified: Secondary | ICD-10-CM | POA: Insufficient documentation

## 2015-08-10 DIAGNOSIS — Z8611 Personal history of tuberculosis: Secondary | ICD-10-CM | POA: Diagnosis not present

## 2015-08-10 DIAGNOSIS — I10 Essential (primary) hypertension: Secondary | ICD-10-CM | POA: Diagnosis not present

## 2015-08-10 DIAGNOSIS — Z7901 Long term (current) use of anticoagulants: Secondary | ICD-10-CM | POA: Insufficient documentation

## 2015-08-10 DIAGNOSIS — K219 Gastro-esophageal reflux disease without esophagitis: Secondary | ICD-10-CM | POA: Insufficient documentation

## 2015-08-10 DIAGNOSIS — Z79899 Other long term (current) drug therapy: Secondary | ICD-10-CM | POA: Diagnosis not present

## 2015-08-10 DIAGNOSIS — Z86711 Personal history of pulmonary embolism: Secondary | ICD-10-CM | POA: Diagnosis not present

## 2015-08-10 DIAGNOSIS — Z7982 Long term (current) use of aspirin: Secondary | ICD-10-CM | POA: Insufficient documentation

## 2015-08-10 DIAGNOSIS — H2512 Age-related nuclear cataract, left eye: Secondary | ICD-10-CM | POA: Diagnosis not present

## 2015-08-10 DIAGNOSIS — D509 Iron deficiency anemia, unspecified: Secondary | ICD-10-CM | POA: Insufficient documentation

## 2015-08-10 DIAGNOSIS — Z859 Personal history of malignant neoplasm, unspecified: Secondary | ICD-10-CM | POA: Diagnosis not present

## 2015-08-10 DIAGNOSIS — H538 Other visual disturbances: Secondary | ICD-10-CM

## 2015-08-10 DIAGNOSIS — M1990 Unspecified osteoarthritis, unspecified site: Secondary | ICD-10-CM | POA: Diagnosis not present

## 2015-08-10 DIAGNOSIS — Z86010 Personal history of colonic polyps: Secondary | ICD-10-CM | POA: Insufficient documentation

## 2015-08-10 DIAGNOSIS — H4901 Third [oculomotor] nerve palsy, right eye: Secondary | ICD-10-CM | POA: Diagnosis not present

## 2015-08-10 DIAGNOSIS — Z86718 Personal history of other venous thrombosis and embolism: Secondary | ICD-10-CM | POA: Diagnosis not present

## 2015-08-10 DIAGNOSIS — Z8742 Personal history of other diseases of the female genital tract: Secondary | ICD-10-CM | POA: Diagnosis not present

## 2015-08-10 DIAGNOSIS — H532 Diplopia: Secondary | ICD-10-CM | POA: Insufficient documentation

## 2015-08-10 LAB — I-STAT CHEM 8, ED
BUN: 5 mg/dL — ABNORMAL LOW (ref 6–20)
CALCIUM ION: 1.27 mmol/L (ref 1.13–1.30)
CHLORIDE: 107 mmol/L (ref 101–111)
Creatinine, Ser: 0.9 mg/dL (ref 0.44–1.00)
GLUCOSE: 98 mg/dL (ref 65–99)
HCT: 38 % (ref 36.0–46.0)
Hemoglobin: 12.9 g/dL (ref 12.0–15.0)
Potassium: 3.3 mmol/L — ABNORMAL LOW (ref 3.5–5.1)
SODIUM: 143 mmol/L (ref 135–145)
TCO2: 22 mmol/L (ref 0–100)

## 2015-08-10 NOTE — Discharge Instructions (Signed)
Blurred Vision Having blurred vision means that you cannot see things clearly. Your vision may seem fuzzy or out of focus. Blurred vision is a very common symptom of an eye or vision problem. Blurred vision is often a gradual blur that occurs in one eye or both eyes. There are many causes of blurred vision, including cataracts, macular degeneration, and diabetic retinopathy. Blurred vision can be diagnosed based on your symptoms and a physical exam. Tell your health care provider about any other health problems you have, any recent eye injury, and any prior surgeries. You may need to see a health care provider who specializes in eye problems (ophthalmologist). Your treatment depends on what is causing your blurred vision.  HOME CARE INSTRUCTIONS  Tell your health care provider about any changes in your blurred vision.  Do not drive or operate heavy machinery if your vision is blurry.  Keep all follow-up visits as directed by your health care provider. This is important. SEEK MEDICAL CARE IF:  Your symptoms get worse.  You have new symptoms.  You have trouble seeing at night.  You have trouble seeing up close or far away.  You have trouble noticing the difference between colors. SEEK IMMEDIATE MEDICAL CARE IF:  You have severe eye pain.  You have a severe headache.  You have flashing lights in your field of vision.  You have a sudden change in vision.  You have a sudden loss of vision.  You have vision change after an injury.  You notice drainage coming from your eyes.  You notice a rash around your eyes.   This information is not intended to replace advice given to you by your health care provider. Make sure you discuss any questions you have with your health care provider.   Document Released: 04/20/2003 Document Revised: 09/01/2014 Document Reviewed: 03/11/2014 Elsevier Interactive Patient Education 2016 [place].   MRI scan showed no acute brain injury or bleed or  aneurysm. Follow-up your eye doctor.

## 2015-08-10 NOTE — ED Provider Notes (Addendum)
MSE was initiated and I personally evaluated the patient and placed orders (if any) at  4:03 PM on August 10, 2015.  The patient appears stable so that the remainder of the MSE may be completed by another provider.  Sent from Dr Trena Platt office for double vision. Dr Manuella Ghazi is concerned about CN III palsy. Wants to evaluate for PCOM aneurysm vs intracranial mass vs microvascular CN III Palsy  Sent for MR brain with MRA head  Jola Schmidt, MD 08/10/15 [place], MD 08/10/15 [phone]

## 2015-08-10 NOTE — Therapy (Signed)
[facility] [address] [place], [place], [address] Phone: [phone]   Fax:  [phone]  Physical Therapy Treatment  Patient Details  Name: Brianna Walker MRN: [id number] Date of Birth: [birth year] Referring Provider: Dr. Michael Boston  Encounter Date: 08/10/2015      PT End of Session - 08/10/15 1150    Visit Number 4   Date for PT Re-Evaluation 09/15/15   Authorization Type g-code on 10th visit; Kx modifier after 15th visit   PT Start Time 1015   PT Stop Time 1100   PT Time Calculation (min) 45 min   Activity Tolerance Patient tolerated treatment well   Behavior During Therapy [facility] for tasks assessed/performed      Past Medical History  Diagnosis Date  . ALLERGIC RHINITIS 09/12/2007  . BACK PAIN 06/19/2008  . Cramp of limb 08/11/2009  . Long term (current) use of anticoagulants 11/29/2010  . FREQUENCY, URINARY 10/07/2009  . GERD 12/05/2006  . HEMORRHOIDS 11/17/2006  . HIP PAIN, RIGHT, CHRONIC 08/11/2009  . HX, PERSONAL, TUBERCULOSIS 11/17/2006  . HYPERLIPIDEMIA 09/12/2007  . HYPERTENSION 11/17/2006  . INSOMNIA-SLEEP DISORDER-UNSPEC 06/19/2008  . LEG PAIN, RIGHT 06/19/2008  . MASS, SUPERFICIAL 09/12/2007  . MENOPAUSAL DISORDER 09/20/2009  . OVERACTIVE BLADDER 03/04/2010  . PULMONARY EMBOLISM, HX OF 11/17/2006    [place] [facility] s/p Pneumonia  . RECTAL BLEEDING 10/07/2007  . SCIATICA, RIGHT 09/12/2007  . SKIN LESION 07/20/2009  . TMJ SYNDROME 11/17/2006  . Iron deficiency anemia 12/07/2011  . Diverticulosis   . Hyperlipidemia 09/12/2007    Qualifier: Diagnosis of  By: Jenny Reichmann MD, Hunt Oris   . Arthritis   . Colon polyps   . DEEP VENOUS THROMBOPHLEBITIS, LEG, RIGHT 03/04/2010    right leg  . Pneumonia   . Rectal mass 06/2014  . Cancer ([place])     dx. 4'16 -oral chemotherapy only.  . Shortness of breath dyspnea     With exertion since chemotherapy tx-oral meds  . Tuberculosis     pt. was tx.    Past Surgical  History  Procedure Laterality Date  . Cholecystectomy open    . Abdominal hysterectomy    . Incisional hernia repair  2002      multiple ventral hernia repair/mesh  . Tumor removed off of right shoulder Right   . S/p right hip replaced min invasive hip surgury [facility] oct 2011 Right 2011  . Hemorroid surgery  removed at dr Silvio Pate office    x 2  . Eus N/A 08/06/2014    Procedure: LOWER ENDOSCOPIC ULTRASOUND (EUS);  Surgeon: Milus Banister, MD;  Location: [facility];  Service: Endoscopy;  Laterality: N/A;  . Incisional hernia repair  01/13/2005    open w onlay Proceed mesh.  Mechele Claude robotic assisted lower anterior resection N/A 06/04/2015    Procedure: XI ROBOTIC ASSISTED LYSIS OF ADHESIONS, LOWER ANTERIOR RESECTION TRANS ABDOMINAL AND TRANS ANAL, COLOANAL HANDSEWN ANASTAOSIS, DIVERTING LOPE ILIOSTOMY, RESECTION GIST TUMOR;  Surgeon: Michael Boston, MD;  Location: [facility];  Service: General;  Laterality: N/A;  . Laparoscopic lysis of adhesions  06/04/2015    Procedure: LAPAROSCOPIC LYSIS OF ADHESIONS;  Surgeon: Michael Boston, MD;  Location: [facility];  Service: General;;    There were no vitals filed for this visit.      Subjective Assessment - 08/10/15 1106    Subjective I am seeing an eye specialist today about my eye. I am doing good with the exercises.  I am walking.  I see the doctor to see when I will have my surgery.    Patient Stated Goals have the sphinter muscle working            [facility] PT Assessment - 08/10/15 0001    Assessment   Medical Diagnosis C49.44 Malignant neoplasm   Onset Date/Surgical Date 06/04/15   Next MD Visit 08/03/2015   Prior Therapy None   Precautions   Precautions Other (comment)   Precaution Comments Cancer precautions   Restrictions   Weight Bearing Restrictions No   Balance Screen   Has the patient fallen in the past 6 months No   Has the patient had a decrease in activity level because of a fear of falling?  No   Is the patient reluctant to leave  their home because of a fear of falling?  No   Prior Function   Level of Independence Independent   Cognition   Overall Cognitive Status Within Functional Limits for tasks assessed   Observation/Other Assessments   Focus on Therapeutic Outcomes (FOTO)  15% limitation   Strength   Overall Strength Comments abdominal strength is 3/5   Right Hip Extension 5/5   Right Hip ABduction 4/5   Left Hip Extension 5/5   Left Hip ABduction 4/5   Ambulation/Gait   Ambulation/Gait No   Standardized Balance Assessment   Five times sit to stand comments  10 sec.    Timed Up and Go Test   TUG Normal TUG   Normal TUG (seconds) 10   TUG Comments no draging feet                  Pelvic Floor Special Questions - 08/10/15 0001    Pelvic Floor Internal Exam Patient confirms identification and approves PT to assess anal sphinter strength   Exam Type Rectal   Strength fair squeeze, definite lift   Strength # of reps 10   Strength # of seconds 5   Biofeedback 10 second hold in sitting, lean forward to relax pelvic floor as she is having a simulated bowel movement, pelvic floor contraction with getting up and down from commode, going up and down stairs, walking.    Biofeedback sensor type Surface  rectal   Biofeedback Activity 10 second hold                   PT Education - 08/10/15 1149    Education provided Yes   Education Details reviewed contraction of pelvic floor with activities   Person(s) Educated Patient   Methods Explanation;Demonstration   Comprehension Verbalized understanding;Returned demonstration          PT Short Term Goals - 08/05/15 1020    PT SHORT TERM GOAL #1   Title independent with HEP for balance and flexibility exercises   Time 4   Period Weeks   Status Achieved   PT SHORT TERM GOAL #2   Title pelvic floor strength is 3/5 with no gluteal contraction    Time 4   Period Weeks   Status On-going   PT SHORT TERM GOAL #3   Title understand  correct toileting technique   Time 4   Period Weeks   Status Achieved           PT Long Term Goals - 08/10/15 1150    PT LONG TERM GOAL #1   Title independent with HEP and understand how to progress herself   Time 8   Period Weeks  Status Achieved   PT LONG TERM GOAL #2   Title pelvic floor strength >/= 4/5 so patient will have less chance of fecal incontinence after ileostomy reversal.    Time 8   Period Weeks   Status Partially Met   PT LONG TERM GOAL #3   Title ability to push therapist finger out when bearing down to facilitate the pushing of bowel with a bowel movement   Time 8   Period Weeks   Status Achieved   PT LONG TERM GOAL #4   Title sit to stand 5 times </= 11 sec to decrease risk of falls   Time 8   Period Weeks   Status Achieved               Plan - 08-11-2015 1151    Clinical Impression Statement Patient is a 70 year old female with diagnsois of malignant neoplasm.  Patient has met her goals exept for pelvic strength being 3/5 instead of 4/5.  Patient will continue with her HEP to further strengthen her pelvic floor. Patient is able to demonstrate correct pelvic relaxation as she simulates pushing a bowel movement out and puhsing the therapist finger out. Patient is able to contract the pelvic floor with transitional movementswalking, and steps.  TUG score has improved to 10 sec instead of 13 sec reducing her risk of falls. Bilateral hip abduction is 4/5 and hip extension is 5/5.  Patient is able to ambulate without dragging her feet and walking daily.  Patient is ready for discharge and seeing physician for when they will reverse the ileostomy.    Rehab Potential Excellent   Clinical Impairments Affecting Rehab Potential presently has a ileostomy    PT Treatment/Interventions Biofeedback;Therapeutic activities;Therapeutic exercise;Patient/family education;Neuromuscular re-education;Manual techniques   PT Next Visit Plan Discharge to HEP   PT Home  Exercise Plan Current HEP   Consulted and Agree with Plan of Care Patient      Patient will benefit from skilled therapeutic intervention in order to improve the following deficits and impairments:  Decreased endurance, Decreased strength  Visit Diagnosis: Muscle weakness (generalized)       G-Codes - 11-Aug-2015 1151    Functional Assessment Tool Used FOTO score is 15% limitation CJ   Functional Limitation Other PT primary   Other PT Primary Goal Status (M3846) At least 1 percent but less than 20 percent impaired, limited or restricted   Other PT Primary Discharge Status (K5993) At least 1 percent but less than 20 percent impaired, limited or restricted      Problem List Patient Active Problem List   Diagnosis Date Noted  . Emesis   . Delayed gastric emptying 06/28/2015  . High output ileostomy ([place]) 06/28/2015  . Necrosis of ileostomy surgical wound ([place]) 06/28/2015  . Obesity (BMI 30-39.9) 06/28/2015  . Abdominal wall seroma - chronic 06/28/2015  . Dehydration   . Protein-calorie malnutrition, severe ([place]) 06/23/2015  . Hypokalemia 06/23/2015  . AKI (acute kidney injury) ([place]) 06/21/2015  . 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 Iron deficiency anemia 12/07/2011  . Hyperlipidemia 09/12/2007    Earlie Counts, PT 2015-08-11 11:59 AM   [facility] [address], [place], [place], [address] Phone: [phone]   Fax:  [phone]  Name: Brianna Walker MRN: [id number] Date of Birth: 07/29/1945 PHYSICAL THERAPY DISCHARGE SUMMARY  Visits from Start of Care: 3  Current functional level related to goals / functional  outcomes: See above.    Remaining deficits: See above.    Education / Equipment: HEP Plan: Patient agrees to discharge.  Patient goals were met. Patient is being discharged due to meeting the stated rehab goals. Thank you for the  referral. Earlie Counts, PT 08/10/2015 11:59 AM   ?????

## 2015-08-10 NOTE — ED Provider Notes (Signed)
CSN: [id number]     Arrival date & time 08/10/15  1514 History   First MD Initiated Contact with Patient 08/10/15 1603     Chief Complaint  Patient presents with  . vision problems      (Consider location/radiation/quality/duration/timing/severity/associated sxs/prior Treatment) HPI....Marland KitchenMarland KitchenPatient reports double vision and blurred vision in the right eye since 07/30/15. She was seen by her eye doctor today who recommended a MRI and MRA of her brain. No trauma to the eye. No fever, sweats, chills. No other neurological deficits. Severity is moderate.  Past Medical History  Diagnosis Date  . ALLERGIC RHINITIS 09/12/2007  . BACK PAIN 06/19/2008  . Cramp of limb 08/11/2009  . Long term (current) use of anticoagulants 11/29/2010  . FREQUENCY, URINARY 10/07/2009  . GERD 12/05/2006  . HEMORRHOIDS 11/17/2006  . HIP PAIN, RIGHT, CHRONIC 08/11/2009  . HX, PERSONAL, TUBERCULOSIS 11/17/2006  . HYPERLIPIDEMIA 09/12/2007  . HYPERTENSION 11/17/2006  . INSOMNIA-SLEEP DISORDER-UNSPEC 06/19/2008  . LEG PAIN, RIGHT 06/19/2008  . MASS, SUPERFICIAL 09/12/2007  . MENOPAUSAL DISORDER 09/20/2009  . OVERACTIVE BLADDER 03/04/2010  . PULMONARY EMBOLISM, HX OF 11/17/2006    [place] [facility] [place]/p Pneumonia  . RECTAL BLEEDING 10/07/2007  . SCIATICA, RIGHT 09/12/2007  . SKIN LESION 07/20/2009  . TMJ SYNDROME 11/17/2006  . Iron deficiency anemia 12/07/2011  . Diverticulosis   . Hyperlipidemia 09/12/2007    Qualifier: Diagnosis of  By: Jenny Reichmann MD, Hunt Oris   . Arthritis   . Colon polyps   . DEEP VENOUS THROMBOPHLEBITIS, LEG, RIGHT 03/04/2010    right leg  . Pneumonia   . Rectal mass 06/2014  . Cancer ([place])     dx. 4'16 -oral chemotherapy only.  . Shortness of breath dyspnea     With exertion since chemotherapy tx-oral meds  . Tuberculosis     pt. was tx.   Past Surgical History  Procedure Laterality Date  . Cholecystectomy open    . Abdominal hysterectomy    . Incisional hernia repair  2002      multiple ventral  hernia repair/mesh  . Tumor removed off of right shoulder Right   . S/p right hip replaced min [facility] hip surg[facility] oct 2011 Right 2011  . Hemorroid surgery  removed at dr Silvio Pate office    x 2  . Eus N/A 08/06/2014    Procedure: LOWER ENDOSCOPIC ULTRASOUND (EUS);  Surgeon: Milus Banister, MD;  Location: [facility];  Service: Endoscopy;  Laterality: N/A;  . Incisional hernia repair  01/13/2005    open w onlay Proceed mesh.  Mechele Claude robotic assisted lower anterior resection N/A 06/04/2015    Procedure: XI ROBOTIC ASSISTED LYSIS OF ADHESIONS, LOWER ANTERIOR RESECTION TRANS ABDOMINAL AND TRANS ANAL, COLOANAL HANDSEWN ANASTAOSIS, DIVERTING LOPE ILIOSTOMY, RESECTION GIST TUMOR;  Surgeon: Michael Boston, MD;  [facility];  Service: General;  Laterality: N/A;  . Laparoscopic lysis of adhesions  06/04/2015    Procedure: LAPAROSCOPIC LYSIS OF ADHESIONS;  Surgeon: Michael Boston, MD;  Location[facility];  Service: General;;   Family History  Problem Relation Age of Onset  . Colon cancer Father   . Heart disease Mother   . Heart disease Sister   . Diabetes Sister    Social History  Substance Use Topics  . Smoking status: Former Smoker -- 0.00 packs/day    Types: Cigarettes    Quit date: 06/09/1977  . Smokeless tobacco: Never Used  . Alcohol Use: 0.0 oz/week    0 Standard drinks  or equivalent per week     Comment: rare   OB History    No data available     Review of Systems  All other systems reviewed and are negative.     Allergies  Review of patient's allergies indicates no known allergies.  Home Medications   Prior to Admission medications   Medication Sig Start Date End Date Taking? Authorizing Provider  apixaban (ELIQUIS) 5 MG TABS tablet Take 1 tablet (5 mg total) by mouth 2 (two) times daily. 09/04/14  Yes Biagio Borg, MD  aspirin 81 MG tablet Take 81 mg by mouth at bedtime.    Yes Historical Provider, MD  benazepril (LOTENSIN) 40 MG tablet TAKE 1 TABLET EVERY EVENING  07/26/15  Yes Biagio Borg, MD  estradiol (VIVELLE-DOT) 0.025 MG/24HR APPLY 1 PATCH ONTO THE SKIN TWO TIMES WEEKLY Patient taking differently: APPLY 1 PATCH ONTO SKIN EVERY SUNDAY. 01/15/15  Yes Biagio Borg, MD  ferrous sulfate 325 (65 FE) MG tablet Take 1 tablet (325 mg total) by mouth daily. 09/04/14  Yes Biagio Borg, MD  loperamide (IMODIUM) 2 MG capsule Take 1-2 capsules (2-4 mg total) by mouth every 8 (eight) hours as needed for diarrhea or loose stools (Use if >2 BM every 8 hours). 06/24/15  Yes Theodis Blaze, MD  LORazepam (ATIVAN) 0.5 MG tablet Take 1 tablet (0.5 mg total) by mouth at bedtime. 07/06/15  Yes Robbie Lis, MD  metoCLOPramide (REGLAN) 10 MG tablet Take 1 tablet (10 mg total) by mouth 4 (four) times daily -  before meals and at bedtime. Patient taking differently: Take 10 mg by mouth daily.  07/06/15  Yes Robbie Lis, MD  metoprolol tartrate (LOPRESSOR) 25 MG tablet Take 0.5 tablets (12.5 mg total) by mouth 2 (two) times daily. Patient taking differently: Take 25 mg by mouth daily.  07/06/15  Yes Robbie Lis, MD  ondansetron (ZOFRAN) 4 MG tablet Take 1 tablet (4 mg total) by mouth every 6 (six) hours as needed for nausea. 06/24/15  Yes Theodis Blaze, MD  pantoprazole (PROTONIX) 40 MG tablet Take 2 tablets (80 mg total) by mouth 2 (two) times daily. Patient taking differently: Take 40 mg by mouth daily.  07/06/15  Yes Robbie Lis, MD  pravastatin (PRAVACHOL) 20 MG tablet Take 1 tablet (20 mg total) by mouth daily. Patient taking differently: Take 20 mg by mouth at bedtime.  09/04/14  Yes Biagio Borg, MD  prochlorperazine (COMPAZINE) 5 MG tablet Take 1 tablet (5 mg total) by mouth every 6 (six) hours as needed for nausea or vomiting. 02/04/15  Yes Owens Shark, NP  traMADol (ULTRAM) 50 MG tablet Take 1 tablet (50 mg total) by mouth every 6 (six) hours as needed for moderate pain. 07/06/15  Yes Robbie Lis, MD  megestrol (MEGACE) 40 MG/ML suspension Take 10 mLs (400 mg total) by mouth  daily. 07/06/15   Robbie Lis, MD  sodium chloride 0.9 % infusion Inject 2,000 mLs into the vein 3 (three) times a week. 06/24/15   Theodis Blaze, MD   BP 167/88 mmHg  Pulse 90  Temp(Src) 98.2 F (36.8 C) (Oral)  Resp 16  SpO2 96% Physical Exam  Constitutional: She is oriented to person, place, and time. She appears well-developed and well-nourished.  HENT:  Head: Normocephalic and atraumatic.  Eyes: Conjunctivae and EOM are normal. Pupils are equal, round, and reactive to light.  Patient complains of blurred and double  vision in right eye  Neck: Normal range of motion. Neck supple.  Cardiovascular: Normal rate and regular rhythm.   Pulmonary/Chest: Effort normal and breath sounds normal.  Abdominal: Soft. Bowel sounds are normal.  Musculoskeletal: Normal range of motion.  Neurological: She is alert and oriented to person, place, and time.  Skin: Skin is warm and dry.  Psychiatric: She has a normal mood and affect. Her behavior is normal.  Nursing note and vitals reviewed.   ED Course  Procedures (including critical care time) Labs Review Labs Reviewed  I-STAT CHEM 8, ED - Abnormal; Notable for the following:    Potassium 3.3 (*)    BUN 5 (*)    All other components within normal limits    Imaging Review Mr Virgel Paling Wo Contrast  08/10/2015  CLINICAL DATA:  Double vision for 10 days. EXAM: MRI HEAD WITHOUT CONTRAST MRA HEAD WITHOUT CONTRAST TECHNIQUE: Multiplanar, multiecho pulse sequences of the brain and surrounding structures were obtained without intravenous contrast. Angiographic images of the head were obtained using MRA technique without contrast. COMPARISON:  Brain MRI 02/23/2011 FINDINGS: MRI HEAD FINDINGS A partially empty sella is again seen. There is no evidence of acute infarct, intracranial hemorrhage, mass, midline shift, or extra-axial fluid collection. Ventricles and sulci are within normal limits for age. Patchy to confluent T2 hyperintensities in the subcortical  and deep cerebral white matter and pons have progressed from the prior MRI and are nonspecific but compatible with moderate chronic small vessel ischemic disease. Chronic small vessel ischemic changes are also seen in the deep gray nuclei bilaterally. Orbits are unremarkable. A small right maxillary sinus mucous retention cyst and chronic right sphenoid sinusitis are again seen. The mastoid air cells are clear. Major intracranial vascular flow voids are preserved. MRA HEAD FINDINGS The visualized distal vertebral arteries are widely patent with the right being dominant. PICA and SCA origins are patent. Basilar artery is patent without stenosis. There is a patent right posterior communicating artery. PCAs are patent without significant stenosis. Internal carotid arteries are patent from skullbase to carotid termini without stenosis. The left A1 segment is absent. The right A1 segment is widely patent and supplies the left ACA. MCAs are patent without evidence of major branch occlusion or significant stenosis. No intracranial aneurysm is identified. IMPRESSION: 1. No acute intracranial abnormality. 2. Moderate chronic small vessel ischemic disease, progressed from 2012. 3. Unremarkable head MRA aside from normal variant anatomy. Electronically Signed   By: Logan Bores M.D.   On: 08/10/2015 20:53   Mr Brain Wo Contrast  08/10/2015  CLINICAL DATA:  Double vision for 10 days. EXAM: MRI HEAD WITHOUT CONTRAST MRA HEAD WITHOUT CONTRAST TECHNIQUE: Multiplanar, multiecho pulse sequences of the brain and surrounding structures were obtained without intravenous contrast. Angiographic images of the head were obtained using MRA technique without contrast. COMPARISON:  Brain MRI 02/23/2011 FINDINGS: MRI HEAD FINDINGS A partially empty sella is again seen. There is no evidence of acute infarct, intracranial hemorrhage, mass, midline shift, or extra-axial fluid collection. Ventricles and sulci are within normal limits for age.  Patchy to confluent T2 hyperintensities in the subcortical and deep cerebral white matter and pons have progressed from the prior MRI and are nonspecific but compatible with moderate chronic small vessel ischemic disease. Chronic small vessel ischemic changes are also seen in the deep gray nuclei bilaterally. Orbits are unremarkable. A small right maxillary sinus mucous retention cyst and chronic right sphenoid sinusitis are again seen. The mastoid air cells are clear.  Major intracranial vascular flow voids are preserved. MRA HEAD FINDINGS The visualized distal vertebral arteries are widely patent with the right being dominant. PICA and SCA origins are patent. Basilar artery is patent without stenosis. There is a patent right posterior communicating artery. PCAs are patent without significant stenosis. Internal carotid arteries are patent from skullbase to carotid termini without stenosis. The left A1 segment is absent. The right A1 segment is widely patent and supplies the left ACA. MCAs are patent without evidence of major branch occlusion or significant stenosis. No intracranial aneurysm is identified. IMPRESSION: 1. No acute intracranial abnormality. 2. Moderate chronic small vessel ischemic disease, progressed from 2012. 3. Unremarkable head MRA aside from normal variant anatomy. Electronically Signed   By: Logan Bores M.D.   On: 08/10/2015 20:53   I have personally reviewed and evaluated these images and lab results as part of my medical decision-making.   EKG Interpretation None      MDM   Final diagnoses:  Blurred vision, right eye    MRI/MRA of brain shows no acute abnormalities. Discussed with patient and her friend. Will follow-up with ophthalmology.    Nat Christen, MD 08/10/15 2150

## 2015-08-10 NOTE — ED Notes (Signed)
Pt transported to MRI 

## 2015-08-10 NOTE — ED Notes (Addendum)
Per pt, states sent here by ophthalmologist for possible stroke and/or aneurysm  R/t her double vision-states symptoms for 10 days-wan

## 2015-08-10 NOTE — Addendum Note (Signed)
Addended by: Earlie Counts F on: 08/10/2015 12:26 PM   Modules accepted: Orders

## 2015-08-11 ENCOUNTER — Ambulatory Visit (HOSPITAL_COMMUNITY): Payer: Commercial Managed Care - HMO

## 2015-08-13 ENCOUNTER — Telehealth: Payer: Self-pay | Admitting: *Deleted

## 2015-08-13 ENCOUNTER — Ambulatory Visit (HOSPITAL_COMMUNITY)
Admission: RE | Admit: 2015-08-13 | Discharge: 2015-08-13 | Disposition: A | Discharge status: Home / Self Care | Payer: Commercial Managed Care - HMO | Source: Ambulatory Visit | Attending: Surgery | Admitting: Surgery

## 2015-08-13 DIAGNOSIS — Z96641 Presence of right artificial hip joint: Secondary | ICD-10-CM | POA: Insufficient documentation

## 2015-08-13 DIAGNOSIS — Z960 Presence of urogenital implants: Secondary | ICD-10-CM | POA: Diagnosis not present

## 2015-08-13 DIAGNOSIS — C49A4 Gastrointestinal stromal tumor of large intestine: Secondary | ICD-10-CM | POA: Insufficient documentation

## 2015-08-13 DIAGNOSIS — K9189 Other postprocedural complications and disorders of digestive system: Secondary | ICD-10-CM | POA: Insufficient documentation

## 2015-08-13 DIAGNOSIS — K5730 Diverticulosis of large intestine without perforation or abscess without bleeding: Secondary | ICD-10-CM | POA: Diagnosis not present

## 2015-08-13 DIAGNOSIS — Z9049 Acquired absence of other specified parts of digestive tract: Secondary | ICD-10-CM | POA: Diagnosis not present

## 2015-08-13 MED ORDER — IOHEXOL 300 MG/ML  SOLN
900.0000 mL | Freq: Once | INTRAMUSCULAR | Status: AC | PRN
  Administered 2015-08-13: 900 mL

## 2015-08-13 NOTE — Telephone Encounter (Signed)
Oncology Nurse Navigator Documentation  Oncology Nurse Navigator Flowsheets 08/13/2015  Navigator Location [facility]  Navigator Encounter Type Telephone  Telephone Outgoing Call;Patient Update;Appt Confirmation/Clarification--says she is having barium study study to assess for ability to reverse her ileostomy.  Surgery Date 06/04/2015  Treatment Phase -  Barriers/Navigation Needs Coordination of Care--Med Onc f/u  Interventions Coordination of Care--called patient & scheduled to see NP 4/20 at 3:15pm  Coordination of Care Appts  Acuity Level 1  Time Spent with Patient 15  Requested office note from 08/03/15 visit with Dr. Johney Maine at [facility].

## 2015-08-17 ENCOUNTER — Encounter: Payer: Commercial Managed Care - HMO | Admitting: Physical Therapy

## 2015-08-19 ENCOUNTER — Other Ambulatory Visit (HOSPITAL_BASED_OUTPATIENT_CLINIC_OR_DEPARTMENT_OTHER): Payer: Commercial Managed Care - HMO

## 2015-08-19 ENCOUNTER — Ambulatory Visit (HOSPITAL_BASED_OUTPATIENT_CLINIC_OR_DEPARTMENT_OTHER): Payer: Commercial Managed Care - HMO | Admitting: Nurse Practitioner

## 2015-08-19 ENCOUNTER — Encounter: Payer: Self-pay | Admitting: *Deleted

## 2015-08-19 ENCOUNTER — Telehealth: Payer: Self-pay | Admitting: Oncology

## 2015-08-19 VITALS — BP 132/76 | HR 106 | Temp 98.3°F | Resp 18 | Ht 63.0 in | Wt 198.6 lb

## 2015-08-19 DIAGNOSIS — C49A4 Gastrointestinal stromal tumor of large intestine: Secondary | ICD-10-CM | POA: Diagnosis not present

## 2015-08-19 DIAGNOSIS — Z86711 Personal history of pulmonary embolism: Secondary | ICD-10-CM

## 2015-08-19 LAB — COMPREHENSIVE METABOLIC PANEL
ALBUMIN: 3.5 g/dL (ref 3.5–5.0)
ALT: 9 U/L (ref 0–55)
AST: 18 U/L (ref 5–34)
Alkaline Phosphatase: 92 U/L (ref 40–150)
Anion Gap: 10 mEq/L (ref 3–11)
BUN: 6.4 mg/dL — ABNORMAL LOW (ref 7.0–26.0)
CHLORIDE: 109 meq/L (ref 98–109)
CO2: 23 mEq/L (ref 22–29)
Calcium: 9.5 mg/dL (ref 8.4–10.4)
Creatinine: 1 mg/dL (ref 0.6–1.1)
EGFR: 67 mL/min/{1.73_m2} — AB (ref >90)
GLUCOSE: 85 mg/dL (ref 70–140)
POTASSIUM: 3.8 meq/L (ref 3.5–5.1)
SODIUM: 141 meq/L (ref 136–145)
Total Bilirubin: 0.89 mg/dL (ref 0.20–1.20)
Total Protein: 8.2 g/dL (ref 6.4–8.3)

## 2015-08-19 LAB — CBC WITH DIFFERENTIAL/PLATELET
BASO%: 0.7 % (ref 0.0–2.0)
BASOS ABS: 0.1 10*3/uL (ref 0.0–0.1)
EOS ABS: 0.2 10*3/uL (ref 0.0–0.5)
EOS%: 2.2 % (ref 0.0–7.0)
HCT: 36.5 % (ref 34.8–46.6)
HEMOGLOBIN: 11.7 g/dL (ref 11.6–15.9)
LYMPH%: 28.6 % (ref 14.0–49.7)
MCH: 25.7 pg (ref 25.1–34.0)
MCHC: 32.1 g/dL (ref 31.5–36.0)
MCV: 79.9 fL (ref 79.5–101.0)
MONO#: 0.6 10*3/uL (ref 0.1–0.9)
MONO%: 6.6 % (ref 0.0–14.0)
NEUT#: 5.3 10*3/uL (ref 1.5–6.5)
NEUT%: 61.9 % (ref 38.4–76.8)
Platelets: 371 10*3/uL (ref 145–400)
RBC: 4.57 10*6/uL (ref 3.70–5.45)
RDW: 15.6 % — AB (ref 11.2–14.5)
WBC: 8.6 10*3/uL (ref 3.9–10.3)
lymph#: 2.5 10*3/uL (ref 0.9–3.3)

## 2015-08-19 NOTE — Progress Notes (Signed)
Oncology Nurse Navigator Documentation  Oncology Nurse Navigator Flowsheets 08/19/2015  Navigator Location [facility]  Navigator Encounter Type Follow-up Appt  Telephone Outgoing Call  Surgery Date -  Treatment Initiated Date 08/20/2015  Patient Visit Type MedOnc  Treatment Phase Pre-Tx/Tx Discussion  Barriers/Navigation Needs Coordination of Care;Education  Education Other--skin care around ostomy, needs to see nurse at [facility] for different tape-current tape is irritating her skin.  Interventions Coordination of Care--ostomy supplies  Coordination of Care Home Health--called [facility] and confirmed she was discharged on 08/13/15. Left VM for nurse manger, Ms. Cato to have her d/c nurse contact patient regarding how/where to order supplies when needed or to give information to this RN. Patient reports she does not know how to order supplies (thought [facility] would continue to provide these).Provided nurse navigator direct # to call back.  Acuity Level 2  Time Spent with Patient 30  She reports she is unable to provide her ostomy care, but her son does the care for her. Bag can stay on up to 6 days at the most. Stool is loose, but somewhat formed at times. Wears belt for added support. She has been instructed to contact office if her ostomy has diarrhea after she resumes her [place]. She will also call if she needs more medication called in.

## 2015-08-19 NOTE — Telephone Encounter (Signed)
Gave and printed appt sched and avs fo rpt for May °

## 2015-08-19 NOTE — Progress Notes (Addendum)
  [place] OFFICE PROGRESS NOTE   Diagnosis:  Gastrointestinal stromal tumor  INTERVAL HISTORY:   Brianna Walker returns for follow-up. Her son is helping manage the ostomy bag. She reports emptying the bag multiple times a day when it is partially full. Output ranges from watery to solid. She denies abdominal pain. She reports a good appetite. She reports a recent "stroke" involving the right eye. She is following up with ophthalmology.  Objective:  Vital signs in last 24 hours:  Blood pressure 132/76, pulse 106, temperature 98.3 F (36.8 C), temperature source Oral, resp. rate 18, height 5\' 3"  (1.6 m), weight 198 lb 9.6 oz (90.084 kg), SpO2 100 %.    Resp: Lungs clear bilaterally. Cardio: Regular rate and rhythm. GI: Right lower quadrant ileostomy. Masslike fullness in the lower abdomen. Vascular: No leg edema. Skin: Skin below the ostomy site with superficial breakdown likely related to tape.    Lab Results:  Lab Results  Component Value Date   WBC 9.1 07/06/2015   HGB 12.9 08/10/2015   HCT 38.0 08/10/2015   MCV 84.8 07/06/2015   PLT 563* 07/06/2015   NEUTROABS 6.4 07/05/2015    Imaging:  No results found.  Medications: I have reviewed the patient's current medications.  Assessment/Plan: 1. Gastrointestinal stromal tumor of the rectum  Status post an endoscopic ultrasound on 08/06/2014 confirming a mass abutting the distal rectal wall with an FNA biopsy confirming a gastrointestinal stromal tumor  Initiation of Gleevec 09/04/2014  MRI pelvis 12/15/2014 with interval decreased size of the large anorectal mass with central necrosis.  Continuation of Gleevec  Gleevec placed on hold 06/02/2015 in anticipation of surgery  06/04/2015 status post low anterior resection and diverting loop ileostomy, resection of tumor  Pathology: 9.5 cm tumor; GISTsubtype spindle; mitotic rate 1/50 high-power field; positive margin of resection  Adjuvant Gleevec  08/19/2015 (three-year in total course planned) 2. Remote history of pulmonary embolism-maintained on apixaban 3. Multiple colon polyps noted on the colonoscopy 07/21/2014 with the pathology revealing tubular adenomas 4. Hospitalization 06/21/2015 through 07/06/2015 with nausea/vomiting, high ileostomy output; found to have delayed gastric emptying. 5. Water soluble contrast enema 08/13/2015 positive for a small to moderate volume of water soluble contrast leakage from the low anterior resection and anastomosis site in the pelvis    Disposition: [name] appears stable. She is recovering from the recent surgery/hospitalizations. Gleevec was placed on hold just prior to surgery. Dr. Benay Walker recommends resuming Brianna Walker for a three-year course. We will obtain baseline labs today to include a CBC and chemistry panel.  She continues follow-up with Dr. Johney Walker regarding the ileostomy. We encouraged her to follow-up with the ostomy nurse regarding the skin breakdown on her abdomen.  She will return for a follow-up visit and labs in 3 weeks. She will contact the office in the interim with any problems.  Patient seen with Dr. Benay Walker.      Brianna Walker ANP/GNP-BC   08/19/2015  4:18 PM  This was a shared visit with Brianna Walker. [name] continues to recover from the low anterior resection. She is followed by Dr. Johney Walker for management of the ileostomy and rectal anastomosis. She will resume Gleevec.  Brianna Walker, M.D.

## 2015-08-24 ENCOUNTER — Telehealth: Payer: Self-pay | Admitting: *Deleted

## 2015-08-24 ENCOUNTER — Encounter: Payer: Commercial Managed Care - HMO | Admitting: Physical Therapy

## 2015-08-24 NOTE — Telephone Encounter (Signed)
Oncology Nurse Navigator Documentation  Oncology Nurse Navigator Flowsheets 08/24/2015  Navigator Location [facility]  Navigator Encounter Type Telephone  Telephone Incoming Call;Patient Update  Surgery Date -  Treatment Initiated Date -  Patient Visit Type -  Treatment Phase -  Barriers/Navigation Needs -  Education -  Interventions -mailed [place] ostomy support group information and contact info for [employer] to make appointment.  Coordination of Care -  Acuity -  Time Spent with Patient -15  Per [facility]: Nurse ordered next 1 month supply of 1 piece bag from [facility] and provided patient contact information for future refills. Nurse suggested she may do better with a 2 piece system, which may allow her to change bag herself more easily instead of her son doing it for her. Patient asked if someone could come to the home weekly to change her bag? She was informed no, her benefit from home health has expired and her son can do the procedure. Mailed ostomy group info and phone # for [employer] for her outpatient ostomy issues

## 2015-08-25 ENCOUNTER — Other Ambulatory Visit: Payer: Self-pay | Admitting: *Deleted

## 2015-08-25 MED ORDER — PRAVASTATIN SODIUM 20 MG PO TABS: 20.0000 mg | ORAL_TABLET | Freq: Every day | ORAL | Status: DC

## 2015-08-25 NOTE — Telephone Encounter (Signed)
Receive call pt states [facility] states MD need to send updated script for her cholesterol med. Inform pt she will be due for her yearly appt in May, will send #[phone] until appt. Made appt for 09/14/15...Johny Chess

## 2015-08-30 ENCOUNTER — Other Ambulatory Visit: Payer: Self-pay

## 2015-08-30 DIAGNOSIS — Z1231 Encounter for screening mammogram for malignant neoplasm of breast: Secondary | ICD-10-CM

## 2015-08-31 ENCOUNTER — Encounter: Payer: Commercial Managed Care - HMO | Admitting: Physical Therapy

## 2015-09-06 MED FILL — GLEEVEC 400 MG TABLET: 400 | 30 days supply | Qty: 30 | Fill #1

## 2015-09-09 ENCOUNTER — Telehealth: Payer: Self-pay | Admitting: Oncology

## 2015-09-09 ENCOUNTER — Ambulatory Visit (HOSPITAL_BASED_OUTPATIENT_CLINIC_OR_DEPARTMENT_OTHER): Payer: Commercial Managed Care - HMO

## 2015-09-09 ENCOUNTER — Encounter: Payer: Self-pay | Admitting: *Deleted

## 2015-09-09 ENCOUNTER — Ambulatory Visit (HOSPITAL_BASED_OUTPATIENT_CLINIC_OR_DEPARTMENT_OTHER): Payer: Commercial Managed Care - HMO | Admitting: Oncology

## 2015-09-09 ENCOUNTER — Telehealth: Payer: Self-pay | Admitting: *Deleted

## 2015-09-09 VITALS — BP 130/72 | HR 94 | Temp 98.3°F | Resp 18 | Ht 63.0 in | Wt 198.4 lb

## 2015-09-09 DIAGNOSIS — Z86711 Personal history of pulmonary embolism: Secondary | ICD-10-CM

## 2015-09-09 DIAGNOSIS — C49A4 Gastrointestinal stromal tumor of large intestine: Secondary | ICD-10-CM

## 2015-09-09 LAB — COMPREHENSIVE METABOLIC PANEL
ALBUMIN: 3.6 g/dL (ref 3.5–5.0)
ALK PHOS: 101 U/L (ref 40–150)
ALT: 10 U/L (ref 0–55)
ANION GAP: 8 meq/L (ref 3–11)
AST: 15 U/L (ref 5–34)
BUN: 9.6 mg/dL (ref 7.0–26.0)
CO2: 23 mEq/L (ref 22–29)
Calcium: 9.1 mg/dL (ref 8.4–10.4)
Chloride: 110 mEq/L — ABNORMAL HIGH (ref 98–109)
Creatinine: 1.3 mg/dL — ABNORMAL HIGH (ref 0.6–1.1)
EGFR: 51 mL/min/{1.73_m2} — AB (ref >90)
Glucose: 89 mg/dl (ref 70–140)
Potassium: 4 mEq/L (ref 3.5–5.1)
Sodium: 141 mEq/L (ref 136–145)
TOTAL PROTEIN: 8.1 g/dL (ref 6.4–8.3)
Total Bilirubin: 0.82 mg/dL (ref 0.20–1.20)

## 2015-09-09 LAB — CBC WITH DIFFERENTIAL/PLATELET
BASO%: 0.3 % (ref 0.0–2.0)
Basophils Absolute: 0 10*3/uL (ref 0.0–0.1)
EOS ABS: 0.1 10*3/uL (ref 0.0–0.5)
EOS%: 0.9 % (ref 0.0–7.0)
HEMATOCRIT: 35.3 % (ref 34.8–46.6)
HEMOGLOBIN: 11.7 g/dL (ref 11.6–15.9)
LYMPH#: 2 10*3/uL (ref 0.9–3.3)
LYMPH%: 28.3 % (ref 14.0–49.7)
MCH: 26.3 pg (ref 25.1–34.0)
MCHC: 33.1 g/dL (ref 31.5–36.0)
MCV: 79.3 fL — AB (ref 79.5–101.0)
MONO#: 0.3 10*3/uL (ref 0.1–0.9)
MONO%: 4.8 % (ref 0.0–14.0)
NEUT%: 65.7 % (ref 38.4–76.8)
NEUTROS ABS: 4.6 10*3/uL (ref 1.5–6.5)
PLATELETS: 250 10*3/uL (ref 145–400)
RBC: 4.45 10*6/uL (ref 3.70–5.45)
RDW: 15.8 % — AB (ref 11.2–14.5)
WBC: 6.9 10*3/uL (ref 3.9–10.3)

## 2015-09-09 NOTE — Telephone Encounter (Signed)
Oncology Nurse Navigator Documentation  Oncology Nurse Navigator Flowsheets 09/09/2015  Navigator Location [facility]  Navigator Encounter Type Telephone;Follow-up Appt  Telephone Outgoing Call  Surgery Date -  Treatment Initiated Date -  Patient Visit Type MedOnc  Treatment Phase Active Tx--Gleevec  Barriers/Navigation Needs Family concerns--ostomy supplies  Education   Interventions Other--call to ostomy supply distributors that are contracted w/Medicaid  Coordination of Care -  Specialty Items/DME Ostom[place] supplies-was able to find #2 boxes of the rings she needed #[id number] and gave to her Did not have any of the 1 piece bags #[id number]  Acuity -  Time Spent with Patient 45  Confirmed that [facility] is not contracted with [place] Medicaid. Called [facility] [phone] and confirmed they accept [place] Medicaid and Medicare. Forwarded this information to patient via voice mail and My chart message.

## 2015-09-09 NOTE — Progress Notes (Signed)
  [place] OFFICE PROGRESS NOTE   Diagnosis: Gastrointestinal stromal tumor  INTERVAL HISTORY:   Brianna Walker returns as scheduled. She resumed Gleevec 08/19/2015. No diarrhea or rash. She has developed increased mucus and liquid drainage from the rectum. The ileostomy output is formed.  Objective:  Vital signs in last 24 hours:  Blood pressure 130/72, pulse 94, temperature 98.3 F (36.8 C), temperature source Oral, resp. rate 18, height 5\' 3"  (1.6 m), weight 198 lb 6.4 oz (89.994 kg), SpO2 100 %.    HEENT: No thrush or ulcers Resp: Lungs clear bilaterally Cardio: Regular rate and rhythm GI: No hepatomegaly, right lower quadrant ileostomy with semi-formed stool, nontender, examination of the perineum is unremarkable Vascular: No leg edema   Lab Results:  Lab Results  Component Value Date   WBC 8.6 08/19/2015   HGB 11.7 08/19/2015   HCT 36.5 08/19/2015   MCV 79.9 08/19/2015   PLT 371 08/19/2015   NEUTROABS 5.3 08/19/2015     Medications: I have reviewed the patient's current medications.  Assessment/Plan: 1. Gastrointestinal stromal tumor of the rectum  Status post an endoscopic ultrasound on 08/06/2014 confirming a mass abutting the distal rectal wall with an FNA biopsy confirming a gastrointestinal stromal tumor  Initiation of Gleevec 09/04/2014  MRI pelvis 12/15/2014 with interval decreased size of the large anorectal mass with central necrosis.  Continuation of Gleevec  Gleevec placed on hold 06/02/2015 in anticipation of surgery  06/04/2015 status post low anterior resection and diverting loop ileostomy, resection of tumor  Pathology: 9.5 cm tumor; GISTsubtype spindle; mitotic rate 1/50 high-power field; positive margin of resection  Adjuvant Gleevec 08/19/2015 (three-year in total course planned) 2. Remote history of pulmonary embolism-maintained on apixaban 3. Multiple colon polyps noted on the colonoscopy 07/21/2014 with the  pathology revealing tubular adenomas 4. Hospitalization 06/21/2015 through 07/06/2015 with nausea/vomiting, high ileostomy output; found to have delayed gastric emptying. 5. Water soluble contrast enema 08/13/2015 positive for a small to moderate volume of water soluble contrast leakage from the low anterior resection and anastomosis site in the pelvis      Disposition:  Brianna Walker appears to be tolerating the [place] well. We will check a CBC and chemistry panel today. She will return for an office visit in one month.  I suspect the "drainage " from the rectum is a normal finding with the loop ileostomy. She will follow-up with Dr. Johney Maine for reversal of the ileostomy when the anastomosis has healed.  Betsy Coder, MD  09/09/2015  3:59 PM

## 2015-09-09 NOTE — Telephone Encounter (Signed)
left msg for june apt

## 2015-09-14 ENCOUNTER — Ambulatory Visit (INDEPENDENT_AMBULATORY_CARE_PROVIDER_SITE_OTHER): Payer: Commercial Managed Care - HMO | Admitting: Internal Medicine

## 2015-09-14 ENCOUNTER — Other Ambulatory Visit: Payer: Self-pay | Admitting: Internal Medicine

## 2015-09-14 ENCOUNTER — Other Ambulatory Visit (INDEPENDENT_AMBULATORY_CARE_PROVIDER_SITE_OTHER): Payer: Commercial Managed Care - HMO

## 2015-09-14 ENCOUNTER — Encounter: Payer: Self-pay | Admitting: Internal Medicine

## 2015-09-14 VITALS — BP 122/82 | HR 93 | Temp 98.5°F | Resp 20 | Wt 199.0 lb

## 2015-09-14 DIAGNOSIS — D509 Iron deficiency anemia, unspecified: Secondary | ICD-10-CM | POA: Diagnosis not present

## 2015-09-14 DIAGNOSIS — Z0001 Encounter for general adult medical examination with abnormal findings: Secondary | ICD-10-CM | POA: Diagnosis not present

## 2015-09-14 DIAGNOSIS — R6889 Other general symptoms and signs: Secondary | ICD-10-CM

## 2015-09-14 DIAGNOSIS — E785 Hyperlipidemia, unspecified: Secondary | ICD-10-CM | POA: Diagnosis not present

## 2015-09-14 DIAGNOSIS — E0590 Thyrotoxicosis, unspecified without thyrotoxic crisis or storm: Secondary | ICD-10-CM | POA: Diagnosis not present

## 2015-09-14 DIAGNOSIS — R7989 Other specified abnormal findings of blood chemistry: Secondary | ICD-10-CM

## 2015-09-14 LAB — URINALYSIS, ROUTINE W REFLEX MICROSCOPIC
BILIRUBIN URINE: NEGATIVE
Ketones, ur: NEGATIVE
Nitrite: NEGATIVE
PH: 5.5 (ref 5.0–8.0)
SPECIFIC GRAVITY, URINE: 1.015 (ref 1.000–1.030)
TOTAL PROTEIN, URINE-UPE24: NEGATIVE
UROBILINOGEN UA: 0.2 (ref 0.0–1.0)
Urine Glucose: NEGATIVE

## 2015-09-14 LAB — LIPID PANEL
CHOL/HDL RATIO: 4
CHOLESTEROL: 128 mg/dL (ref 0–200)
HDL: 34.4 mg/dL — ABNORMAL LOW (ref >39.00)
NONHDL: 94.04
Triglycerides: 229 mg/dL — ABNORMAL HIGH (ref 0.0–149.0)
VLDL: 45.8 mg/dL — AB (ref 0.0–40.0)

## 2015-09-14 LAB — TSH: TSH: 1.27 u[IU]/mL (ref 0.35–4.50)

## 2015-09-14 LAB — LDL CHOLESTEROL, DIRECT: Direct LDL: 49 mg/dL

## 2015-09-14 NOTE — Progress Notes (Signed)
Pre visit review using our clinic review tool, if applicable. No additional management support is needed unless otherwise documented below in the visit note. 

## 2015-09-14 NOTE — Assessment & Plan Note (Signed)
Overall doing well, age appropriate education and counseling updated, referrals for preventative services and immunizations addressed, dietary and smoking counseling addressed, most recent labs reviewed.  I have personally reviewed and have noted:  1) the patient's medical and social history 2) The pt's use of alcohol, tobacco, and illicit drugs 3) The patient's current medications and supplements 4) Functional ability including ADL's, fall risk, home safety risk, hearing and visual impairment 5) Diet and physical activities 6) Evidence for depression or mood disorder 7) The patient's height, weight, and BMI have been recorded in the chart  I have made referrals, and provided counseling and education based on review of the above Lab Results  Component Value Date   WBC 6.9 09/09/2015   HGB 11.7 09/09/2015   HCT 35.3 09/09/2015   PLT 250 09/09/2015   GLUCOSE 89 09/09/2015   CHOL 156 02/11/2014   TRIG 183* 07/05/2015   HDL 32.60* 02/11/2014   LDLDIRECT 86.1 02/11/2014   LDLCALC 81 05/28/2012   ALT 10 09/09/2015   AST 15 09/09/2015   NA 141 09/09/2015   K 4.0 09/09/2015   CL 107 08/10/2015   CREATININE 1.3* 09/09/2015   BUN 9.6 09/09/2015   CO2 23 09/09/2015   TSH 0.102* 06/21/2015   INR 1.03 06/04/2015   HGBA1C 5.6 06/01/2015

## 2015-09-14 NOTE — Assessment & Plan Note (Signed)
Lab Results  Component Value Date   TSH 0.102* 06/21/2015   For endo referral for likely hyperthyroidism, asympt

## 2015-09-14 NOTE — Assessment & Plan Note (Signed)
stable overall by history and exam, recent data reviewed with pt, and pt to continue medical treatment as before,  to f/u any worsening symptoms or concerns Lab Results  Component Value Date   LDLCALC 81 05/28/2012

## 2015-09-14 NOTE — Progress Notes (Signed)
Subjective:    Patient ID: Brianna Walker, female    DOB: June 25, 1945, 70 y.o.   MRN: [id number]  HPI  Here for wellness and f/u;  Overall doing ok;  Pt denies Chest pain, worsening SOB, DOE, wheezing, orthopnea, PND, worsening LE edema, palpitations, dizziness or syncope.  Pt denies neurological change such as new headache, facial or extremity weakness.  Pt denies polydipsia, polyuria, or low sugar symptoms. Pt states overall good compliance with treatment and medications, good tolerability, and has been trying to follow appropriate diet.  Pt denies worsening depressive symptoms, suicidal ideation or panic. No fever, night sweats, wt loss, loss of appetite, or other constitutional symptoms.  Pt states good ability with ADL's, has low fall risk, home safety reviewed and adequate, no other significant changes in hearing or vision, and only occasionally active with exercise. Denies hyper or hypo thyroid symptoms such as voice, skin or hair change. Due for mammogram tomorrow.  Denies urinary symptoms such as dysuria, frequency, urgency, flank pain, hematuria or n/v, fever, chills. Denies worsening reflux, abd pain, dysphagia, n/v, bowel change or blood. Past Medical History  Diagnosis Date  . ALLERGIC RHINITIS 09/12/2007  . BACK PAIN 06/19/2008  . Cramp of limb 08/11/2009  . Long term (current) use of anticoagulants 11/29/2010  . FREQUENCY, URINARY 10/07/2009  . GERD 12/05/2006  . HEMORRHOIDS 11/17/2006  . HIP PAIN, RIGHT, CHRONIC 08/11/2009  . HX, PERSONAL, TUBERCULOSIS 11/17/2006  . HYPERLIPIDEMIA 09/12/2007  . HYPERTENSION 11/17/2006  . INSOMNIA-SLEEP DISORDER-UNSPEC 06/19/2008  . LEG PAIN, RIGHT 06/19/2008  . MASS, SUPERFICIAL 09/12/2007  . MENOPAUSAL DISORDER 09/20/2009  . OVERACTIVE BLADDER 03/04/2010  . PULMONARY EMBOLISM, HX OF 11/17/2006    [facility] s/p Pneumonia  . RECTAL BLEEDING 10/07/2007  . SCIATICA, RIGHT 09/12/2007  . SKIN LESION 07/20/2009  . TMJ SYNDROME 11/17/2006  . Iron  deficiency anemia 12/07/2011  . Diverticulosis   . Hyperlipidemia 09/12/2007    Qualifier: Diagnosis of  By: Jenny Reichmann MD, Hunt Oris   . Arthritis   . Colon polyps   . DEEP VENOUS THROMBOPHLEBITIS, LEG, RIGHT 03/04/2010    right leg  . Pneumonia   . Rectal mass 06/2014  . Cancer ([place])     dx. 4'16 -oral chemotherapy only.  . Shortness of breath dyspnea     With exertion since chemotherapy tx-oral meds  . Tuberculosis     pt. was tx.   Past Surgical History  Procedure Laterality Date  . Cholecystectomy open    . Abdominal hysterectomy    . Incisional hernia repair  2002      multiple ventral hernia repair/mesh  . Tumor removed off of right shoulder Right   . S/p right hip replaced min invasive hip surgury [facility] oct 2011 Right 2011  . Hemorroid surgery  removed at dr Silvio Pate office    x 2  . Eus N/A 08/06/2014    Procedure: LOWER ENDOSCOPIC ULTRASOUND (EUS);  Surgeon: Milus Banister, MD;  Location: [facility];  Service: Endoscopy;  Laterality: N/A;  . Incisional hernia repair  01/13/2005    open w onlay Proceed mesh.  Mechele Claude robotic assisted lower anterior resection N/A 06/04/2015    Procedure: XI ROBOTIC ASSISTED LYSIS OF ADHESIONS, LOWER ANTERIOR RESECTION TRANS ABDOMINAL AND TRANS ANAL, COLOANAL HANDSEWN ANASTAOSIS, DIVERTING LOPE ILIOSTOMY, RESECTION GIST TUMOR;  Surgeon: Michael Boston, MD;  Location: [facility];  Service: General;  Laterality: N/A;  . Laparoscopic lysis of adhesions  06/04/2015  Procedure: LAPAROSCOPIC LYSIS OF ADHESIONS;  Surgeon: Michael Boston, MD;  Location: [facility];  Service: General;;    reports that she quit smoking about 38 years ago. Her smoking use included Cigarettes. She smoked 0.00 packs per day. She has never used smokeless tobacco. She reports that she drinks alcohol. She reports that she does not use illicit drugs. family history includes Colon cancer in her father; Diabetes in her sister; Heart disease in her mother and sister. No Known Allergies Current  Outpatient Prescriptions on File Prior to Visit  Medication Sig Dispense Refill  . apixaban (ELIQUIS) 5 MG TABS tablet Take 1 tablet (5 mg total) by mouth 2 (two) times daily. 180 tablet 3  . aspirin 81 MG tablet Take 81 mg by mouth at bedtime.     . benazepril (LOTENSIN) 40 MG tablet TAKE 1 TABLET EVERY EVENING 90 tablet 3  . estradiol (VIVELLE-DOT) 0.025 MG/24HR APPLY 1 PATCH ONTO THE SKIN TWO TIMES WEEKLY (Patient taking differently: APPLY 1 PATCH ONTO SKIN EVERY SUNDAY.) 24 patch 2  . ferrous sulfate 325 (65 FE) MG tablet Take 1 tablet (325 mg total) by mouth daily. 180 tablet 3  . loperamide (IMODIUM) 2 MG capsule Take 1-2 capsules (2-4 mg total) by mouth every 8 (eight) hours as needed for diarrhea or loose stools (Use if >2 BM every 8 hours). 30 capsule 0  . LORazepam (ATIVAN) 0.5 MG tablet Take 1 tablet (0.5 mg total) by mouth at bedtime. 30 tablet 0  . metoCLOPramide (REGLAN) 10 MG tablet Take 1 tablet (10 mg total) by mouth 4 (four) times daily -  before meals and at bedtime. (Patient taking differently: Take 10 mg by mouth 2 (two) times daily. ) 120 tablet 0  . metoprolol tartrate (LOPRESSOR) 25 MG tablet Take 0.5 tablets (12.5 mg total) by mouth 2 (two) times daily. (Patient taking differently: Take 25 mg by mouth daily. ) 60 tablet 0  . ondansetron (ZOFRAN) 4 MG tablet Take 1 tablet (4 mg total) by mouth every 6 (six) hours as needed for nausea. 65 tablet 0  . pantoprazole (PROTONIX) 40 MG tablet Take 2 tablets (80 mg total) by mouth 2 (two) times daily. (Patient taking differently: Take 40 mg by mouth daily. ) 60 tablet 0  . pravastatin (PRAVACHOL) 20 MG tablet Take 1 tablet (20 mg total) by mouth at bedtime. Must keep May appt for future refills 90 tablet 0  . prochlorperazine (COMPAZINE) 5 MG tablet Take 1 tablet (5 mg total) by mouth every 6 (six) hours as needed for nausea or vomiting. 30 tablet 0  . sodium chloride 0.9 % infusion Inject 2,000 mLs into the vein 3 (three) times a  week. 2000 mL 3  . traMADol (ULTRAM) 50 MG tablet Take 1 tablet (50 mg total) by mouth every 6 (six) hours as needed for moderate pain. 65 tablet 5   No current facility-administered medications on file prior to visit.    Review of Systems Constitutional: Negative for increased diaphoresis, or other activity, appetite or siginficant weight change other than noted HENT: Negative for worsening hearing loss, ear pain, facial swelling, mouth sores and neck stiffness.   Eyes: Negative for other worsening pain, redness or visual disturbance.  Respiratory: Negative for choking or stridor Cardiovascular: Negative for other chest pain and palpitations.  Gastrointestinal: Negative for worsening diarrhea, blood in stool, or abdominal distention Genitourinary: Negative for hematuria, flank pain or change in urine volume.  Musculoskeletal: Negative for myalgias or other joint complaints.  Skin: Negative for other color change and wound or drainage.  Neurological: Negative for syncope and numbness. other than noted Hematological: Negative for adenopathy. or other swelling Psychiatric/Behavioral: Negative for hallucinations, SI, self-injury, decreased concentration or other worsening agitation.      Objective:   Physical Exam BP 122/82 mmHg  Pulse 93  Temp(Src) 98.5 F (36.9 C) (Oral)  Resp 20  Wt 199 lb (90.266 kg)  SpO2 99% VS noted, not ill appearing, non toxic Constitutional: Pt is oriented to person, place, and time. Appears well-developed and well-nourished, in no significant distress Head: Normocephalic and atraumatic  Eyes: Conjunctivae and EOM are normal. Pupils are equal, round, and reactive to light Right Ear: External ear normal.  Left Ear: External ear normal Nose: Nose normal.  Mouth/Throat: Oropharynx is clear and moist  Neck: Normal range of motion. Neck supple. No JVD present. No tracheal deviation present or significant neck LA or mass Cardiovascular: Normal rate, regular  rhythm, normal heart sounds and intact distal pulses.   Pulmonary/Chest: Effort normal and breath sounds without rales or wheezing  Abdominal: Soft. Bowel sounds are normal. NT. No HSM  Musculoskeletal: Normal range of motion. Exhibits no edema Lymphadenopathy: Has no cervical adenopathy.  Neurological: Pt is alert and oriented to person, place, and time. Pt has normal reflexes. No cranial nerve deficit. Motor grossly intact Skin: Skin is warm and dry. No rash noted or new ulcers Psychiatric:  Has normal mood and affect. Behavior is normal.   Lab Results  Component Value Date   WBC 6.9 09/09/2015   HGB 11.7 09/09/2015   HCT 35.3 09/09/2015   PLT 250 09/09/2015   GLUCOSE 89 09/09/2015   CHOL 156 02/11/2014   TRIG 183* 07/05/2015   HDL 32.60* 02/11/2014   LDLDIRECT 86.1 02/11/2014   LDLCALC 81 05/28/2012   ALT 10 09/09/2015   AST 15 09/09/2015   NA 141 09/09/2015   K 4.0 09/09/2015   CL 107 08/10/2015   CREATININE 1.3* 09/09/2015   BUN 9.6 09/09/2015   CO2 23 09/09/2015   TSH 0.102* 06/21/2015   INR 1.03 06/04/2015   HGBA1C 5.6 06/01/2015       Assessment & Plan:

## 2015-09-14 NOTE — Patient Instructions (Signed)
Please continue all other medications as before, and refills have been done if requested.  Please have the pharmacy call with any other refills you may need.  Please continue your efforts at being more active, low cholesterol diet, and weight control.  You are otherwise up to date with prevention measures today.  Please keep your appointments with your specialists as you may have planned  You will be contacted regarding the referral for: endocrinology  Please go to the LAB in the Basement (turn left off the elevator) for the tests to be done today  You will be contacted by phone if any changes need to be made immediately.  Otherwise, you will receive a letter about your results with an explanation, but please check with MyChart first.  Please remember to sign up for MyChart if you have not done so, as this will be important to you in the future with finding out test results, communicating by private email, and scheduling acute appointments online when needed.  Please return in 1 year for your yearly visit, or sooner if needed, with Lab testing done 3-5 days before

## 2015-09-14 NOTE — Assessment & Plan Note (Signed)
stable overall by history and exam, recent data reviewed with pt, and pt to continue medical treatment as before,  to f/u any worsening symptoms or concerns Lab Results  Component Value Date   WBC 6.9 09/09/2015   HGB 11.7 09/09/2015   HCT 35.3 09/09/2015   MCV 79.3* 09/09/2015   PLT 250 09/09/2015

## 2015-09-15 ENCOUNTER — Ambulatory Visit
Admission: RE | Admit: 2015-09-15 | Discharge: 2015-09-15 | Disposition: A | Discharge status: Home / Self Care | Payer: Commercial Managed Care - HMO | Source: Ambulatory Visit

## 2015-09-15 DIAGNOSIS — Z1231 Encounter for screening mammogram for malignant neoplasm of breast: Secondary | ICD-10-CM | POA: Diagnosis not present

## 2015-09-16 DIAGNOSIS — C182 Malignant neoplasm of ascending colon: Secondary | ICD-10-CM | POA: Diagnosis not present

## 2015-09-16 DIAGNOSIS — Z932 Ileostomy status: Secondary | ICD-10-CM | POA: Diagnosis not present

## 2015-09-17 ENCOUNTER — Telehealth: Payer: Self-pay | Admitting: Internal Medicine

## 2015-09-17 NOTE — Telephone Encounter (Signed)
Left patient vm to call back in regards. °

## 2015-09-17 NOTE — Telephone Encounter (Signed)
Please advise 

## 2015-09-17 NOTE — Telephone Encounter (Signed)
Since it is mild and is not related to any cancer, ok to keep appt in July, thanks

## 2015-09-17 NOTE — Telephone Encounter (Signed)
Patient states Dr. Jenny Reichmann diagnosed her with overactive thyroid.  States endo can not get her in until July.  Would like to know what to do between now and then.  Please follow up in regards.

## 2015-10-07 MED FILL — GLEEVEC 400 MG TABLET: 400 | 30 days supply | Qty: 30 | Fill #2

## 2015-10-11 ENCOUNTER — Ambulatory Visit (HOSPITAL_BASED_OUTPATIENT_CLINIC_OR_DEPARTMENT_OTHER): Payer: Commercial Managed Care - HMO | Admitting: Oncology

## 2015-10-11 ENCOUNTER — Other Ambulatory Visit (HOSPITAL_BASED_OUTPATIENT_CLINIC_OR_DEPARTMENT_OTHER): Payer: Commercial Managed Care - HMO

## 2015-10-11 ENCOUNTER — Telehealth: Payer: Self-pay | Admitting: Oncology

## 2015-10-11 VITALS — BP 132/86 | HR 71 | Temp 97.8°F | Resp 18 | Ht 63.0 in | Wt 196.2 lb

## 2015-10-11 DIAGNOSIS — C49A4 Gastrointestinal stromal tumor of large intestine: Secondary | ICD-10-CM

## 2015-10-11 LAB — CBC WITH DIFFERENTIAL/PLATELET
BASO%: 0.6 % (ref 0.0–2.0)
Basophils Absolute: 0 10*3/uL (ref 0.0–0.1)
EOS ABS: 0.1 10*3/uL (ref 0.0–0.5)
EOS%: 1.2 % (ref 0.0–7.0)
HEMATOCRIT: 34.8 % (ref 34.8–46.6)
HEMOGLOBIN: 11.3 g/dL — AB (ref 11.6–15.9)
LYMPH#: 1.6 10*3/uL (ref 0.9–3.3)
LYMPH%: 24.6 % (ref 14.0–49.7)
MCH: 26.4 pg (ref 25.1–34.0)
MCHC: 32.4 g/dL (ref 31.5–36.0)
MCV: 81.4 fL (ref 79.5–101.0)
MONO#: 0.2 10*3/uL (ref 0.1–0.9)
MONO%: 3.7 % (ref 0.0–14.0)
NEUT%: 69.9 % (ref 38.4–76.8)
NEUTROS ABS: 4.5 10*3/uL (ref 1.5–6.5)
Platelets: 281 10*3/uL (ref 145–400)
RBC: 4.28 10*6/uL (ref 3.70–5.45)
RDW: 19.7 % — AB (ref 11.2–14.5)
WBC: 6.4 10*3/uL (ref 3.9–10.3)

## 2015-10-11 LAB — COMPREHENSIVE METABOLIC PANEL
ALBUMIN: 3.6 g/dL (ref 3.5–5.0)
ALK PHOS: 108 U/L (ref 40–150)
ALT: 11 U/L (ref 0–55)
AST: 15 U/L (ref 5–34)
Anion Gap: 7 mEq/L (ref 3–11)
BUN: 9.6 mg/dL (ref 7.0–26.0)
CHLORIDE: 109 meq/L (ref 98–109)
CO2: 28 meq/L (ref 22–29)
Calcium: 9.2 mg/dL (ref 8.4–10.4)
Creatinine: 1.2 mg/dL — ABNORMAL HIGH (ref 0.6–1.1)
EGFR: 55 mL/min/{1.73_m2} — ABNORMAL LOW (ref >90)
GLUCOSE: 93 mg/dL (ref 70–140)
Potassium: 3.5 mEq/L (ref 3.5–5.1)
SODIUM: 143 meq/L (ref 136–145)
Total Bilirubin: 1.21 mg/dL — ABNORMAL HIGH (ref 0.20–1.20)
Total Protein: 7.9 g/dL (ref 6.4–8.3)

## 2015-10-11 NOTE — Progress Notes (Signed)
With plan repeat enema antegrade through the loop ileostomy later this month.  She needs time for that to close down.

## 2015-10-11 NOTE — Progress Notes (Signed)
  [place] OFFICE PROGRESS NOTE   Diagnosis: Gastrointestinal stromal tumor  INTERVAL HISTORY:   Mrs. Earnhardt returns as scheduled. She continues Gleevec. She reports tolerating the [place] well. She empties the ileostomy approximately 5 times per day. The stool is partially formed. Occasional diarrhea improves with Imodium. She is anxious to see Dr. Johney Maine to discuss reversing the ileostomy.  Objective:  Vital signs in last 24 hours:  Blood pressure 132/86, pulse 71, temperature 97.8 F (36.6 C), temperature source Oral, resp. rate 18, height 5\' 3"  (1.6 m), weight 196 lb 3.2 oz (88.996 kg), SpO2 100 %.    HEENT: No thrush or ulcers Resp: Lungs clear bilaterally Cardio: Regular rate and rhythm GI: No hepatomegaly, low abdomen ileostomy with semi-formed stool Vascular: No leg edema   Lab Results:  Lab Results  Component Value Date   WBC 6.4 10/11/2015   HGB 11.3* 10/11/2015   HCT 34.8 10/11/2015   MCV 81.4 10/11/2015   PLT 281 10/11/2015   NEUTROABS 4.5 10/11/2015  Potassium 3.5, creatinine 1.2, BUN 9.6   Medications: I have reviewed the patient's current medications.  Assessment/Plan: 1. Gastrointestinal stromal tumor of the rectum  Status post an endoscopic ultrasound on 08/06/2014 confirming a mass abutting the distal rectal wall with an FNA biopsy confirming a gastrointestinal stromal tumor  Initiation of Gleevec 09/04/2014  MRI pelvis 12/15/2014 with interval decreased size of the large anorectal mass with central necrosis.  Continuation of Gleevec  Gleevec placed on hold 06/02/2015 in anticipation of surgery  06/04/2015 status post low anterior resection and diverting loop ileostomy, resection of tumor  Pathology: 9.5 cm tumor; GISTsubtype spindle; mitotic rate 1/50 high-power field; positive margin of resection  Adjuvant Gleevec 08/19/2015 (three-year in total course planned) 2. Remote history of pulmonary embolism-maintained on  apixaban 3. Multiple colon polyps noted on the colonoscopy 07/21/2014 with the pathology revealing tubular adenomas 4. Hospitalization 06/21/2015 through 07/06/2015 with nausea/vomiting, high ileostomy output; found to have delayed gastric emptying. 5. Water soluble contrast enema 08/13/2015 positive for a small to moderate volume of water soluble contrast leakage from the low anterior resection and anastomosis site in the pelvis   Disposition:  Ms. Aleo is tolerating the [place] well. She will return for an office and lab visit in one month.  Betsy Coder, MD  10/11/2015  4:30 PM

## 2015-10-11 NOTE — Telephone Encounter (Signed)
Gave pt apt & avs °

## 2015-10-25 DIAGNOSIS — Z932 Ileostomy status: Secondary | ICD-10-CM | POA: Diagnosis not present

## 2015-10-25 DIAGNOSIS — C182 Malignant neoplasm of ascending colon: Secondary | ICD-10-CM | POA: Diagnosis not present

## 2015-11-08 ENCOUNTER — Ambulatory Visit (INDEPENDENT_AMBULATORY_CARE_PROVIDER_SITE_OTHER): Payer: Commercial Managed Care - HMO | Admitting: Internal Medicine

## 2015-11-08 ENCOUNTER — Other Ambulatory Visit: Payer: Self-pay | Admitting: Oncology

## 2015-11-08 ENCOUNTER — Encounter: Payer: Self-pay | Admitting: Internal Medicine

## 2015-11-08 VITALS — BP 132/82 | HR 93 | Wt 197.0 lb

## 2015-11-08 DIAGNOSIS — E0590 Thyrotoxicosis, unspecified without thyrotoxic crisis or storm: Secondary | ICD-10-CM

## 2015-11-08 DIAGNOSIS — E041 Nontoxic single thyroid nodule: Secondary | ICD-10-CM | POA: Diagnosis not present

## 2015-11-08 LAB — T3, FREE: T3 FREE: 2.9 pg/mL (ref 2.3–4.2)

## 2015-11-08 LAB — TSH: TSH: 0.23 u[IU]/mL — ABNORMAL LOW (ref 0.35–4.50)

## 2015-11-08 LAB — T4, FREE: Free T4: 0.82 ng/dL (ref 0.60–1.60)

## 2015-11-08 NOTE — Patient Instructions (Signed)
Please stop at the lab.  Please come back for a follow-up appointment in 4 months.  Hyperthyroidism Hyperthyroidism is when the thyroid is too active (overactive). Your thyroid is a large gland that is located in your neck. The thyroid helps to control how your body uses food (metabolism). When your thyroid is overactive, it produces too much of a hormone called thyroxine.  CAUSES Causes of hyperthyroidism may include:  Graves disease. This is when your immune system attacks the thyroid gland. This is the most common cause.  Inflammation of the thyroid gland.  Tumor in the thyroid gland or somewhere else.  Excessive use of thyroid medicines, including:  Prescription thyroid supplement.  Herbal supplements that mimic thyroid hormones.  Solid or fluid-filled lumps within your thyroid gland (thyroid nodules).  Excessive ingestion of iodine. RISK FACTORS  Being female.  Having a family history of thyroid conditions. SIGNS AND SYMPTOMS Signs and symptoms of hyperthyroidism may include:  Nervousness.  Inability to tolerate heat.  Unexplained weight loss.  Diarrhea.  Change in the texture of hair or skin.  Heart skipping beats or making extra beats.  Rapid heart rate.  Loss of menstruation.  Shaky hands.  Fatigue.  Restlessness.  Increased appetite.  Sleep problems.  Enlarged thyroid gland or nodules. DIAGNOSIS  Diagnosis of hyperthyroidism may include:  Medical history and physical exam.  Blood tests.  Ultrasound tests. TREATMENT Treatment may include:  Medicines to control your thyroid.  Surgery to remove your thyroid.  Radiation therapy. HOME CARE INSTRUCTIONS   Take medicines only as directed by your health care provider.  Do not use any tobacco products, including cigarettes, chewing tobacco, or electronic cigarettes. If you need help quitting, ask your health care provider.  Do not exercise or do physical activity until your health care  provider approves.  Keep all follow-up appointments as directed by your health care provider. This is important. SEEK MEDICAL CARE IF:  Your symptoms do not get better with treatment.  You have fever.  You are taking thyroid replacement medicine and you:  Have depression.  Feel mentally and physically slow.  Have weight gain. SEEK IMMEDIATE MEDICAL CARE IF:   You have decreased alertness or a change in your awareness.  You have abdominal pain.  You feel dizzy.  You have a rapid heartbeat.  You have an irregular heartbeat.   This information is not intended to replace advice given to you by your health care provider. Make sure you discuss any questions you have with your health care provider.   Document Released: 04/17/2005 Document Revised: 05/08/2014 Document Reviewed: 09/02/2013 Elsevier Interactive Patient Education [employer].

## 2015-11-08 NOTE — Progress Notes (Addendum)
Patient ID: [name], female   DOB: 08/21/1945, 70 y.o.   MRN: [id number]   HPI  Brianna Walker is a 70 y.o.-year-old female, referred by her PCP, Dr. Jenny Walker, for evaluation for thyrotoxicosis.  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 She has some rectal bleeding after starting the med.   I reviewed pt's thyroid tests: Lab Results  Component Value Date   TSH 1.27 09/14/2015   TSH 0.102* 06/21/2015   TSH 0.21* 02/11/2014   TSH 0.26* 01/22/2013   TSH 0.57 08/12/2012   TSH 0.35 05/28/2012   TSH 0.32* 12/05/2011   TSH 0.51 09/16/2009   TSH 0.51 09/10/2008   TSH 0.60 09/05/2007   FREET4 1.35* 06/21/2015   FREET4 0.82 03/05/2014  01/07/2010: TSH 0.64, Free T4 0.93  Of note, the labs in 06/2015 were obtained after CT with contrast.  Pt denies feeling nodules in neck, hoarseness, dysphagia/odynophagia, SOB with lying down; she c/o: - + fatigue (after Surgery), + poor sleep - + weight loss (lost ~30 lbs over 3 months - partly intentional) >> since then, less tired - no excessive sweating/heat intolerance - no tremors - no anxiety - no palpitations - + hair loss  Pt does not have a FH of thyroid ds. No FH of thyroid cancer. No h/o radiation tx to head or neck.  No seaweed or kelp, no recent contrast studies. No steroid use. No herbal supplements. No Biotin use.  Pt. also has a history of rectal GIST dx in 2016, for which she is seeing Dr. Benay Spice, and is on [place]. She also has a ileostomy, which she plan to have reversed. She also has a history of pulmonary embolism. Per review of her most recent MRI of the brain in 07/2015, she has a partially empty sella.  ROS: Constitutional: See history of present illness Eyes: + blurry vision, no xerophthalmia ENT: no sore throat, no nodules palpated in throat, no dysphagia/odynophagia, no hoarseness Cardiovascular: no CP/SOB/palpitations/leg swelling Respiratory:  + cough/no SOB Gastrointestinal: no N/V/D/C/+ heartburn Musculoskeletal: no muscle/joint aches Skin: no rashes, + hair loss, + easy bruising Neurological: no tremors/numbness/tingling/dizziness Psychiatric: no depression/anxiety + Low libido  Past Medical History  Diagnosis Date  . ALLERGIC RHINITIS 09/12/2007  . BACK PAIN 06/19/2008  . Cramp of limb 08/11/2009  . Long term (current) use of anticoagulants 11/29/2010  . FREQUENCY, URINARY 10/07/2009  . GERD 12/05/2006  . HEMORRHOIDS 11/17/2006  . HIP PAIN, RIGHT, CHRONIC 08/11/2009  . HX, PERSONAL, TUBERCULOSIS 11/17/2006  . HYPERLIPIDEMIA 09/12/2007  . HYPERTENSION 11/17/2006  . INSOMNIA-SLEEP DISORDER-UNSPEC 06/19/2008  . LEG PAIN, RIGHT 06/19/2008  . MASS, SUPERFICIAL 09/12/2007  . MENOPAUSAL DISORDER 09/20/2009  . OVERACTIVE BLADDER 03/04/2010  . PULMONARY EMBOLISM, HX OF 11/17/2006    [place] [facility] s/p Pneumonia  . RECTAL BLEEDING 10/07/2007  . SCIATICA, RIGHT 09/12/2007  . SKIN LESION 07/20/2009  . TMJ SYNDROME 11/17/2006  . Iron deficiency anemia 12/07/2011  . Diverticulosis   . Hyperlipidemia 09/12/2007    Qualifier: Diagnosis of  By: Brianna Reichmann MD, Hunt Oris   . Arthritis   . Colon polyps   . DEEP VENOUS THROMBOPHLEBITIS, LEG, RIGHT 03/04/2010    right leg  . Pneumonia   . Rectal mass 06/2014  . Cancer ([place])     dx. 4'16 -oral chemotherapy only.  . Shortness of breath dyspnea     With exertion since chemotherapy tx-oral meds  .  Tuberculosis     pt. was tx.   Past Surgical History  Procedure Laterality Date  . Cholecystectomy open    . Abdominal hysterectomy    . Incisional hernia repair  2002      multiple ventral hernia repair/mesh  . Tumor removed off of right shoulder Right   . S/p right hip replaced min invasive hip surgury [facility] oct 2011 Right 2011  . Hemorroid surgery  removed at dr Silvio Pate office    x 2  . Eus N/A 08/06/2014    Procedure: LOWER ENDOSCOPIC ULTRASOUND (EUS);  Surgeon: Milus Banister, MD;   Location: [facility];  Service: Endoscopy;  Laterality: N/A;  . Incisional hernia repair  01/13/2005    open w onlay Proceed mesh.  Mechele Claude robotic assisted lower anterior resection N/A 06/04/2015    Procedure: XI ROBOTIC ASSISTED LYSIS OF ADHESIONS, LOWER ANTERIOR RESECTION TRANS ABDOMINAL AND TRANS ANAL, COLOANAL HANDSEWN ANASTAOSIS, DIVERTING LOPE ILIOSTOMY, RESECTION GIST TUMOR;  Surgeon: Michael Boston, MD;  Location: [facility];  Service: General;  Laterality: N/A;  . Laparoscopic lysis of adhesions  06/04/2015    Procedure: LAPAROSCOPIC LYSIS OF ADHESIONS;  Surgeon: Michael Boston, MD;  Location: [facility];  Service: General;;   Social History   Social History  . Marital Status: Married    Spouse Name: N/A  . Number of Children: 2  . Years of Education: N/A   Occupational History  . retired    Social History Main Topics  . Smoking status: Former Smoker -- 0.00 packs/day    Types: Cigarettes    Quit date: 06/09/1977  . Smokeless tobacco: Never Used  . Alcohol Use: 0.0 oz/week    0 Standard drinks or equivalent per week     Comment: rare  . Drug Use: No     Comment: past marijuana  . Sexual Activity: Not on file   Other Topics Concern  . Not on file   Social History Narrative   Husband civil Chief Financial Officer [place]   Has #2 children (one son at home with her)   Prior employment-various customer service positions   Current Outpatient Prescriptions on File Prior to Visit  Medication Sig Dispense Refill  . aspirin 81 MG tablet Take 81 mg by mouth at bedtime.     . benazepril (LOTENSIN) 40 MG tablet TAKE 1 TABLET EVERY EVENING 90 tablet 3  . ELIQUIS 5 MG TABS tablet TAKE 1 TABLET TWICE DAILY 180 tablet 3  . estradiol (VIVELLE-DOT) 0.025 MG/24HR APPLY 1 PATCH ONTO THE SKIN TWO TIMES WEEKLY (Patient taking differently: APPLY 1 PATCH ONTO SKIN EVERY SUNDAY.) 24 patch 2  . ferrous sulfate 325 (65 FE) MG tablet Take 1 tablet (325 mg total) by mouth daily. 180 tablet 3  . loperamide (IMODIUM) 2 MG  capsule Take 1-2 capsules (2-4 mg total) by mouth every 8 (eight) hours as needed for diarrhea or loose stools (Use if >2 BM every 8 hours). 30 capsule 0  . LORazepam (ATIVAN) 0.5 MG tablet Take 1 tablet (0.5 mg total) by mouth at bedtime. 30 tablet 0  . omeprazole (PRILOSEC) 20 MG capsule TAKE 1 CAPSULE EVERY EVENING 90 capsule 3  . pravastatin (PRAVACHOL) 20 MG tablet TAKE 1 TABLET AT BEDTIME. MUST KEEP MAY APPT FOR FUTURE REFILLS. 90 tablet 0  . traMADol (ULTRAM) 50 MG tablet Take 1 tablet (50 mg total) by mouth every 6 (six) hours as needed for moderate pain. 65 tablet 5   No current facility-administered medications on  file prior to visit.   No Known Allergies Family History  Problem Relation Age of Onset  . Colon cancer Father   . Heart disease Mother   . Heart disease Sister   . Diabetes Sister    PE: BP 132/82 mmHg  Pulse 93  Wt 197 lb (89.359 kg)  SpO2 95% Body mass index is 34.91 kg/(m^2). Wt Readings from Last 3 Encounters:  11/08/15 197 lb (89.359 kg)  10/11/15 196 lb 3.2 oz (88.996 kg)  09/14/15 199 lb (90.266 kg)   Constitutional: overweight, in NAD Eyes: PERRLA, EOMI, no exophthalmos, no lid lag, no stare ENT: moist mucous membranes, no thyromegaly, no thyroid bruits, no cervical lymphadenopathy Cardiovascular: RRR, No MRG Respiratory: CTA B Gastrointestinal: abdomen soft, NT, ND, BS+ Musculoskeletal: no deformities, strength intact in all 4 Skin: moist, warm, no rashes Neurological: no tremor with outstretched hands, DTR normal in all 4  ASSESSMENT: 1. Thyrotoxicosis - latest TSH normal, in 08/2015  PLAN:  1. Patient with a h/o fluctuating TFTs, with 3 low TSH levels from 2014-05/2015, however, with last TSH normal in 08/2015. No thyrotoxic sxs, but has weight loss after her GIST sx with creation of ileostomy, denies heat intolerance, hyperdefecation, palpitations, anxiety. Her HR was 93 today, but she was late and rushed over here.. - she does not appear to  have exogenous causes for the low TSH. However, in 06/2015, she had a contrasted CT scan before her TFT check. We discussed about the fact that the Jenkins (imatinib) is known to be able to cause thyroid dysfunction, but usually in the form of hypothyroidism. She also has a history of partially empty sella, but I doubt central hypothyroidism, since she had one instance of elevated free T4 in the past pointing towards thyrotoxicosis - We discussed that possible causes of thyrotoxicosis are:  Graves ds   Thyroiditis toxic multinodular goiter/ toxic adenoma (I cannot feel nodules at palpation of her thyroid). However, I suspect that this may be the cause of her fluctuating TFTs - I suggested that we check the TSH, fT3 and fT4 and also add thyroid stimulating antibodies to screen for Graves' disease.  - If the tests remain abnormal, we may need an uptake and scan to differentiate between the 3 above possible etiologies - we discussed about possible modalities of treatment for the above conditions, to include methimazole use, radioactive iodine ablation or (last resort) surgery. - I did advice her that we might need to do thyroid ultrasound depending on the results of the uptake and scan (if a cold nodule is present) - I do not feel that we need to add beta blockers at this time - RTC in 4 months, but possibly sooner for repeat labs  Office Visit on 11/08/2015  Component Date Value Ref Range Status  . T3, Free 11/08/2015 2.9  2.3 - 4.2 pg/mL Final  . Free T4 11/08/2015 0.82  0.60 - 1.60 ng/dL Final  . TSH 11/08/2015 0.23* 0.35 - 4.50 uIU/mL Final  . TSI 11/08/2015 134  <140 % baseline Final   Comment: Thyroid stimulating immunoglobulins (TSI) can engage the TSH receptors resulting in hyperthyroidism in Graves' disease patients. TSI levels can be useful in monitoring the clinical outcome of Graves' disease as well as assessing the potential for hyperthyroidism from maternal-fetal transfer. TSI  results greater than or equal to (>=) 140% of the Reference Control are considered positive. NOTE: A serum TSH level greater than 350 micro-International Units/mL can interfere with the TSI bioassay and  potentially give false positive results. Patients who are pregnant and are suspected of having hyperthyroidism should have both TSI and human Chorionic Gonadotropin(hCG) tests measured. A serum hCG level greater than 40,625 mIU/mL can interfere with the TSI bioassay and may give false negative results. In these patients it is recommended that a second TSI be obtained when the hCG concentration falls below 40,625 mIU/mL (usually after approximately 20-weeks gestation). The an                          alytical performance characteristics of this assay have been determined by [employer], [place], [place]. The modifications have not been cleared or approved by the FDA. This assay has been validated pursuant to the CLIA regulations and is used for clinical purposes.    TSH is again low, With normal free T4 and free T3, and also normal TSI antibodies. I would like to obtain a thyroid uptake and scan at this point, but no treatment is needed for now until we have a more definitive diagnosis.  NM THYROID SNG UPTAKE W/IMAGING  Order: [id number]  Status:  Final result Visible to patient:  No (Not Released) Dx:  Thyrotoxicosis without thyroid storm,...  Details   Reading Physician Reading Date Result Priority  Lavonia Dana, MD 12/08/2015   Narrative    CLINICAL DATA: Subclinical thyrotoxicosis without thyroid storm, decrease weight, tremor  EXAM: THYROID SCAN AND UPTAKE - 24 HOURS  TECHNIQUE: Following the per oral administration of I-131 sodium iodide, the patient returned at 24 hours and uptake measurements were acquired with the uptake probe centered on the neck. Thyroid imaging was performed following the intravenous administration of the  Tc-70m Pertechnetate.  RADIOPHARMACEUTICALS: 9.7 MicroCuries I-131 sodium iodide orally and 9.5 mCi Technetium-26m pertechnetate IV  COMPARISON: None  FINDINGS: 24 hour radio iodine uptake is calculated at 10%, at the lower end of normal range.  Imaging the thyroid gland in 3 projections demonstrates multiple foci of increased tracer localization compatible with a multinodular thyroid gland.  Absent tracer at the inferior pole the RIGHT lobe, which could represent a cold nodule or suppression of normal thyroid tissue.  IMPRESSION: Borderline low 24 hour radio iodine uptake of 10%.  Multinodular thyroid gland with question cold nodule versus suppressed normal thyroid tissue at the inferior pole of the RIGHT lobe; thyroid ultrasound recommended to exclude cold nodule/mass at inferior pole RIGHT thyroid lobe.   Electronically Signed By: Lavonia Dana M.D. On: 12/08/2015 15:37         Mild toxic multinodular goiter. We'll continue to follow for now with a repeat set of labs at next visit. However, in the meantime, I would like to obtain a thyroid ultrasound to investigate the right photopenic defect.  Philemon Kingdom, MD PhD [facility] Endocrinology

## 2015-11-09 DIAGNOSIS — H43821 Vitreomacular adhesion, right eye: Secondary | ICD-10-CM | POA: Diagnosis not present

## 2015-11-09 DIAGNOSIS — H4901 Third [oculomotor] nerve palsy, right eye: Secondary | ICD-10-CM | POA: Diagnosis not present

## 2015-11-09 MED FILL — GLEEVEC 400 MG TABLET: 400 | 30 days supply | Qty: 30 | Fill #0

## 2015-11-11 ENCOUNTER — Telehealth: Payer: Self-pay | Admitting: Nurse Practitioner

## 2015-11-11 ENCOUNTER — Other Ambulatory Visit (HOSPITAL_BASED_OUTPATIENT_CLINIC_OR_DEPARTMENT_OTHER): Payer: Commercial Managed Care - HMO

## 2015-11-11 ENCOUNTER — Ambulatory Visit (HOSPITAL_BASED_OUTPATIENT_CLINIC_OR_DEPARTMENT_OTHER): Payer: Commercial Managed Care - HMO | Admitting: Nurse Practitioner

## 2015-11-11 VITALS — BP 152/90 | HR 95 | Temp 98.1°F | Resp 18 | Ht 63.0 in | Wt 196.5 lb

## 2015-11-11 DIAGNOSIS — C49A4 Gastrointestinal stromal tumor of large intestine: Secondary | ICD-10-CM | POA: Diagnosis not present

## 2015-11-11 DIAGNOSIS — K6289 Other specified diseases of anus and rectum: Secondary | ICD-10-CM | POA: Diagnosis not present

## 2015-11-11 LAB — COMPREHENSIVE METABOLIC PANEL
ALBUMIN: 3.6 g/dL (ref 3.5–5.0)
ALK PHOS: 125 U/L (ref 40–150)
ALT: 10 U/L (ref 0–55)
AST: 16 U/L (ref 5–34)
Anion Gap: 8 mEq/L (ref 3–11)
BILIRUBIN TOTAL: 1.39 mg/dL — AB (ref 0.20–1.20)
BUN: 10.6 mg/dL (ref 7.0–26.0)
CALCIUM: 9.1 mg/dL (ref 8.4–10.4)
CO2: 27 mEq/L (ref 22–29)
Chloride: 110 mEq/L — ABNORMAL HIGH (ref 98–109)
Creatinine: 1.6 mg/dL — ABNORMAL HIGH (ref 0.6–1.1)
EGFR: 38 mL/min/{1.73_m2} — ABNORMAL LOW (ref >90)
Glucose: 91 mg/dl (ref 70–140)
POTASSIUM: 4 meq/L (ref 3.5–5.1)
Sodium: 144 mEq/L (ref 136–145)
Total Protein: 8 g/dL (ref 6.4–8.3)

## 2015-11-11 LAB — CBC WITH DIFFERENTIAL/PLATELET
BASO%: 0.3 % (ref 0.0–2.0)
BASOS ABS: 0 10*3/uL (ref 0.0–0.1)
EOS ABS: 0.1 10*3/uL (ref 0.0–0.5)
EOS%: 1.1 % (ref 0.0–7.0)
HEMATOCRIT: 33.4 % — AB (ref 34.8–46.6)
HEMOGLOBIN: 11.2 g/dL — AB (ref 11.6–15.9)
LYMPH#: 1.4 10*3/uL (ref 0.9–3.3)
LYMPH%: 21.6 % (ref 14.0–49.7)
MCH: 28.1 pg (ref 25.1–34.0)
MCHC: 33.5 g/dL (ref 31.5–36.0)
MCV: 83.9 fL (ref 79.5–101.0)
MONO#: 0.3 10*3/uL (ref 0.1–0.9)
MONO%: 4.7 % (ref 0.0–14.0)
NEUT#: 4.6 10*3/uL (ref 1.5–6.5)
NEUT%: 72.3 % (ref 38.4–76.8)
Platelets: 267 10*3/uL (ref 145–400)
RBC: 3.98 10*6/uL (ref 3.70–5.45)
RDW: 18.3 % — AB (ref 11.2–14.5)
WBC: 6.3 10*3/uL (ref 3.9–10.3)

## 2015-11-11 NOTE — Progress Notes (Signed)
  [place] OFFICE PROGRESS NOTE   Diagnosis:  Gastrointestinal stromal tumor  INTERVAL HISTORY:   Brianna Walker returns as scheduled. She continues Gleevec. She denies nausea/vomiting. No mouth sores. No rash. She denies shortness of breath. She reports emptying the ileostomy approximately 5 times per day when it is partially full. The stool is partially formed. For the past month or so she has noted a mucousy rectal discharge that at times is mixed with "old blood".  Objective:  Vital signs in last 24 hours:  Pulse 95, temperature 98.1 F (36.7 C), temperature source Oral, resp. rate 18, height 5\' 3"  (1.6 m), weight 196 lb 8 oz (89.132 kg), SpO2 100 %.    HEENT: No thrush or ulcers. Resp: Lungs clear bilaterally. Cardio: Regular rate and rhythm. GI: Abdomen soft and nontender. No hepatomegaly. Right abdomen ileostomy. Vascular: No leg edema. Skin: Faint fine rash at the upper outer arms.   Lab Results:  Lab Results  Component Value Date   WBC 6.3 11/11/2015   HGB 11.2* 11/11/2015   HCT 33.4* 11/11/2015   MCV 83.9 11/11/2015   PLT 267 11/11/2015   NEUTROABS 4.6 11/11/2015    Imaging:  No results found.  Medications: I have reviewed the patient's current medications.  Assessment/Plan: 1. Gastrointestinal stromal tumor of the rectum  Status post an endoscopic ultrasound on 08/06/2014 confirming a mass abutting the distal rectal wall with an FNA biopsy confirming a gastrointestinal stromal tumor  Initiation of Gleevec 09/04/2014  MRI pelvis 12/15/2014 with interval decreased size of the large anorectal mass with central necrosis.  Continuation of Gleevec  Gleevec placed on hold 06/02/2015 in anticipation of surgery  06/04/2015 status post low anterior resection and diverting loop ileostomy, resection of tumor  Pathology: 9.5 cm tumor; GISTsubtype spindle; mitotic rate 1/50 high-power field; positive margin of resection  Adjuvant Gleevec  08/19/2015 (three-year in total course planned) 2. Remote history of pulmonary embolism-maintained on apixaban 3. Multiple colon polyps noted on the colonoscopy 07/21/2014 with the pathology revealing tubular adenomas 4. Hospitalization 06/21/2015 through 07/06/2015 with nausea/vomiting, high ileostomy output; found to have delayed gastric emptying. 5. Water soluble contrast enema 08/13/2015 positive for a small to moderate volume of water soluble contrast leakage from the low anterior resection and anastomosis site in the pelvis   Disposition:[name] appears stable. She will continue Gleevec. She will return for a follow-up visit in one month.  She will contact Dr. Johney Maine regarding the rectal discharge.  Ned Card ANP/GNP-BC   11/11/2015  3:14 PM

## 2015-11-11 NOTE — Telephone Encounter (Signed)
per pof to sch pt appt-gave pt copy of avs °

## 2015-11-12 ENCOUNTER — Telehealth: Payer: Self-pay

## 2015-11-12 ENCOUNTER — Other Ambulatory Visit: Payer: Self-pay | Admitting: Surgery

## 2015-11-12 DIAGNOSIS — C49A4 Gastrointestinal stromal tumor of large intestine: Secondary | ICD-10-CM

## 2015-11-12 NOTE — Telephone Encounter (Signed)
-----   Message from Owens Shark, NP sent at 11/12/2015  2:27 PM EDT ----- Please let her know labs from yesterday look like she might be dehydrated. Instruct her to push fluids, try imodium to slow down ostomy output and schedule a repeat basic metabolic panel next week.

## 2015-11-12 NOTE — Telephone Encounter (Signed)
Called and informed pt to push fluids and take imodium as needed to slow ostomy output down. Informed pt scheduling will call her for a repeat lab appt for next week. Patient requests not on Wednesday, Tuesday afternoon is best. POF sent. Pt verbalized all understanding and will call with any questions or concerns.

## 2015-11-16 ENCOUNTER — Other Ambulatory Visit (HOSPITAL_BASED_OUTPATIENT_CLINIC_OR_DEPARTMENT_OTHER): Payer: Commercial Managed Care - HMO

## 2015-11-16 DIAGNOSIS — C49A4 Gastrointestinal stromal tumor of large intestine: Secondary | ICD-10-CM | POA: Diagnosis not present

## 2015-11-16 LAB — BASIC METABOLIC PANEL
ANION GAP: 8 meq/L (ref 3–11)
BUN: 7.2 mg/dL (ref 7.0–26.0)
CO2: 24 meq/L (ref 22–29)
Calcium: 8.8 mg/dL (ref 8.4–10.4)
Chloride: 109 mEq/L (ref 98–109)
Creatinine: 1.1 mg/dL (ref 0.6–1.1)
EGFR: 63 mL/min/{1.73_m2} — ABNORMAL LOW (ref >90)
GLUCOSE: 80 mg/dL (ref 70–140)
POTASSIUM: 3.7 meq/L (ref 3.5–5.1)
Sodium: 141 mEq/L (ref 136–145)

## 2015-11-17 ENCOUNTER — Other Ambulatory Visit: Payer: Self-pay | Admitting: General Surgery

## 2015-11-17 ENCOUNTER — Telehealth: Payer: Self-pay | Admitting: *Deleted

## 2015-11-17 ENCOUNTER — Ambulatory Visit
Admission: RE | Admit: 2015-11-17 | Discharge: 2015-11-17 | Disposition: A | Discharge status: Home / Self Care | Payer: Commercial Managed Care - HMO | Source: Ambulatory Visit | Attending: Surgery | Admitting: Surgery

## 2015-11-17 ENCOUNTER — Encounter (HOSPITAL_COMMUNITY): Payer: Self-pay | Admitting: *Deleted

## 2015-11-17 ENCOUNTER — Ambulatory Visit
Admission: RE | Admit: 2015-11-17 | Discharge: 2015-11-17 | Disposition: A | Discharge status: Home / Self Care | Payer: Commercial Managed Care - HMO | Source: Ambulatory Visit | Attending: General Surgery | Admitting: General Surgery

## 2015-11-17 ENCOUNTER — Inpatient Hospital Stay (HOSPITAL_COMMUNITY)
Admission: AD | Admit: 2015-11-17 | Discharge: 2015-11-19 | DRG: 394 | Disposition: A | Discharge status: Home / Self Care | Payer: Commercial Managed Care - HMO | Source: Ambulatory Visit | Attending: General Surgery | Admitting: General Surgery

## 2015-11-17 DIAGNOSIS — Z79899 Other long term (current) drug therapy: Secondary | ICD-10-CM | POA: Diagnosis not present

## 2015-11-17 DIAGNOSIS — Z833 Family history of diabetes mellitus: Secondary | ICD-10-CM

## 2015-11-17 DIAGNOSIS — Z8701 Personal history of pneumonia (recurrent): Secondary | ICD-10-CM

## 2015-11-17 DIAGNOSIS — Z8672 Personal history of thrombophlebitis: Secondary | ICD-10-CM

## 2015-11-17 DIAGNOSIS — E876 Hypokalemia: Secondary | ICD-10-CM | POA: Diagnosis not present

## 2015-11-17 DIAGNOSIS — K631 Perforation of intestine (nontraumatic): Secondary | ICD-10-CM | POA: Diagnosis present

## 2015-11-17 DIAGNOSIS — C49A4 Gastrointestinal stromal tumor of large intestine: Secondary | ICD-10-CM

## 2015-11-17 DIAGNOSIS — Z96641 Presence of right artificial hip joint: Secondary | ICD-10-CM | POA: Diagnosis present

## 2015-11-17 DIAGNOSIS — Z932 Ileostomy status: Secondary | ICD-10-CM | POA: Diagnosis not present

## 2015-11-17 DIAGNOSIS — Z86711 Personal history of pulmonary embolism: Secondary | ICD-10-CM

## 2015-11-17 DIAGNOSIS — K219 Gastro-esophageal reflux disease without esophagitis: Secondary | ICD-10-CM | POA: Diagnosis present

## 2015-11-17 DIAGNOSIS — Z86718 Personal history of other venous thrombosis and embolism: Secondary | ICD-10-CM | POA: Diagnosis not present

## 2015-11-17 DIAGNOSIS — Z7982 Long term (current) use of aspirin: Secondary | ICD-10-CM | POA: Diagnosis not present

## 2015-11-17 DIAGNOSIS — E785 Hyperlipidemia, unspecified: Secondary | ICD-10-CM | POA: Diagnosis present

## 2015-11-17 DIAGNOSIS — Z800 Family history of malignant neoplasm of digestive organs: Secondary | ICD-10-CM | POA: Diagnosis not present

## 2015-11-17 DIAGNOSIS — C49A5 Gastrointestinal stromal tumor of rectum: Secondary | ICD-10-CM | POA: Diagnosis present

## 2015-11-17 DIAGNOSIS — Z8249 Family history of ischemic heart disease and other diseases of the circulatory system: Secondary | ICD-10-CM | POA: Diagnosis not present

## 2015-11-17 DIAGNOSIS — Z87891 Personal history of nicotine dependence: Secondary | ICD-10-CM | POA: Diagnosis not present

## 2015-11-17 DIAGNOSIS — Z86010 Personal history of colonic polyps: Secondary | ICD-10-CM

## 2015-11-17 DIAGNOSIS — N3281 Overactive bladder: Secondary | ICD-10-CM | POA: Diagnosis present

## 2015-11-17 DIAGNOSIS — Z7901 Long term (current) use of anticoagulants: Secondary | ICD-10-CM

## 2015-11-17 DIAGNOSIS — I10 Essential (primary) hypertension: Secondary | ICD-10-CM | POA: Diagnosis not present

## 2015-11-17 HISTORY — DX: Perforation of intestine (nontraumatic): K63.1

## 2015-11-17 LAB — CBC WITH DIFFERENTIAL/PLATELET
Basophils Absolute: 0 10*3/uL (ref 0.0–0.1)
Basophils Relative: 0 %
Eosinophils Absolute: 0 10*3/uL (ref 0.0–0.7)
Eosinophils Relative: 0 %
HEMATOCRIT: 33.5 % — AB (ref 36.0–46.0)
HEMOGLOBIN: 11.3 g/dL — AB (ref 12.0–15.0)
LYMPHS ABS: 1.2 10*3/uL (ref 0.7–4.0)
LYMPHS PCT: 12 %
MCH: 28.4 pg (ref 26.0–34.0)
MCHC: 33.7 g/dL (ref 30.0–36.0)
MCV: 84.2 fL (ref 78.0–100.0)
MONO ABS: 0.3 10*3/uL (ref 0.1–1.0)
MONOS PCT: 2 %
NEUTROS ABS: 9.1 10*3/uL — AB (ref 1.7–7.7)
Neutrophils Relative %: 86 %
Platelets: 290 10*3/uL (ref 150–400)
RBC: 3.98 MIL/uL (ref 3.87–5.11)
RDW: 17.6 % — AB (ref 11.5–15.5)
WBC: 10.6 10*3/uL — ABNORMAL HIGH (ref 4.0–10.5)

## 2015-11-17 LAB — PROTIME-INR
INR: 1.25 (ref 0.00–1.49)
Prothrombin Time: 15.8 seconds — ABNORMAL HIGH (ref 11.6–15.2)

## 2015-11-17 LAB — URINE MICROSCOPIC-ADD ON: Squamous Epithelial / LPF: NONE SEEN

## 2015-11-17 LAB — TYPE AND SCREEN
ABO/RH(D): O POS
Antibody Screen: NEGATIVE

## 2015-11-17 LAB — COMPREHENSIVE METABOLIC PANEL
ALK PHOS: 117 U/L (ref 38–126)
ALT: 13 U/L — ABNORMAL LOW (ref 14–54)
ANION GAP: 6 (ref 5–15)
AST: 20 U/L (ref 15–41)
Albumin: 3.9 g/dL (ref 3.5–5.0)
BILIRUBIN TOTAL: 1.5 mg/dL — AB (ref 0.3–1.2)
BUN: 8 mg/dL (ref 6–20)
CALCIUM: 8.7 mg/dL — AB (ref 8.9–10.3)
CO2: 25 mmol/L (ref 22–32)
Chloride: 107 mmol/L (ref 101–111)
Creatinine, Ser: 1.1 mg/dL — ABNORMAL HIGH (ref 0.44–1.00)
GFR calc Af Amer: 59 mL/min — ABNORMAL LOW (ref >60)
GFR, EST NON AFRICAN AMERICAN: 51 mL/min — AB (ref >60)
Glucose, Bld: 97 mg/dL (ref 65–99)
POTASSIUM: 3.3 mmol/L — AB (ref 3.5–5.1)
Sodium: 138 mmol/L (ref 135–145)
TOTAL PROTEIN: 7.8 g/dL (ref 6.5–8.1)

## 2015-11-17 LAB — URINALYSIS, ROUTINE W REFLEX MICROSCOPIC
BILIRUBIN URINE: NEGATIVE
GLUCOSE, UA: NEGATIVE mg/dL
Ketones, ur: NEGATIVE mg/dL
Nitrite: NEGATIVE
Protein, ur: NEGATIVE mg/dL
SPECIFIC GRAVITY, URINE: 1.021 (ref 1.005–1.030)
pH: 5.5 (ref 5.0–8.0)

## 2015-11-17 LAB — MAGNESIUM
MAGNESIUM: 1.8 mg/dL (ref 1.7–2.4)
MAGNESIUM: 1.7 mg/dL (ref 1.7–2.4)

## 2015-11-17 LAB — APTT: aPTT: 29 seconds (ref 24–37)

## 2015-11-17 MED ORDER — HEPARIN (PORCINE) IN NACL 100-0.45 UNIT/ML-% IJ SOLN
1000.0000 [IU]/h | INTRAMUSCULAR | Status: DC
  Administered 2015-11-18: 1000 [IU]/h via INTRAVENOUS
  Filled 2015-11-17 (×2): qty 250

## 2015-11-17 MED ORDER — DIPHENHYDRAMINE HCL 50 MG/ML IJ SOLN: 25.0000 mg | Freq: Four times a day (QID) | INTRAMUSCULAR | Status: DC | PRN

## 2015-11-17 MED ORDER — SODIUM CHLORIDE 0.9 % IV SOLN
INTRAVENOUS | Status: DC
  Administered 2015-11-17: 17:00:00 via INTRAVENOUS

## 2015-11-17 MED ORDER — PIPERACILLIN-TAZOBACTAM 3.375 G IVPB
3.3750 g | Freq: Three times a day (TID) | INTRAVENOUS | Status: DC
  Administered 2015-11-17 – 2015-11-19 (×5): 3.375 g via INTRAVENOUS
  Filled 2015-11-17 (×7): qty 50

## 2015-11-17 MED ORDER — FAMOTIDINE IN NACL 20-0.9 MG/50ML-% IV SOLN
20.0000 mg | Freq: Two times a day (BID) | INTRAVENOUS | Status: DC
  Administered 2015-11-17 – 2015-11-18 (×3): 20 mg via INTRAVENOUS
  Filled 2015-11-17 (×4): qty 50

## 2015-11-17 MED ORDER — ONDANSETRON HCL 4 MG/2ML IJ SOLN: 4.0000 mg | Freq: Four times a day (QID) | INTRAMUSCULAR | Status: DC | PRN

## 2015-11-17 MED ORDER — POTASSIUM CHLORIDE IN NACL 20-0.9 MEQ/L-% IV SOLN
INTRAVENOUS | Status: DC
  Administered 2015-11-17 – 2015-11-18 (×3): via INTRAVENOUS
  Filled 2015-11-17 (×4): qty 1000

## 2015-11-17 MED ORDER — HYDROMORPHONE HCL 1 MG/ML IJ SOLN
0.5000 mg | INTRAMUSCULAR | Status: DC | PRN
  Administered 2015-11-18 – 2015-11-19 (×2): 0.5 mg via INTRAVENOUS
  Filled 2015-11-17 (×2): qty 1

## 2015-11-17 MED ORDER — LORAZEPAM 2 MG/ML IJ SOLN: 0.2500 mg | Freq: Three times a day (TID) | INTRAMUSCULAR | Status: DC | PRN

## 2015-11-17 MED ORDER — ONDANSETRON 4 MG PO TBDP: 4.0000 mg | ORAL_TABLET | Freq: Four times a day (QID) | ORAL | Status: DC | PRN

## 2015-11-17 NOTE — Telephone Encounter (Signed)
Message from pt reporting worm-like secretions from her rectum. Asking if she should come in to office to be seen/ bring them to be checked. Reviewed with Ned Card, NP: Some rectal discharge is to be expected, pt should contact surgeon if having bleeding or abnormal discharge.  Returned call, left message instructing her to contact Dr. Johney Maine' office for evaluation.

## 2015-11-17 NOTE — Progress Notes (Signed)
ANTICOAGULATION CONSULT NOTE - Initial Consult  Pharmacy Consult for heparin IV Indication: Hx VTE  No Known Allergies  Patient Measurements: Height: 5\' 3"  (160 cm) Weight: 195 lb 8.8 oz (88.7 kg) IBW/kg (Calculated) : 52.4 Heparin Dosing Weight: 72.5 kg  Vital Signs: Temp: 98.4 F (36.9 C) (07/19 1620) Temp Source: Oral (07/19 1620) BP: 124/78 mmHg (07/19 1544) Pulse Rate: 103 (07/19 1544)  Labs:  Recent Labs  11/16/15 1425 11/17/15 1651  HGB  --  11.3*  HCT  --  33.5*  PLT  --  290  APTT  --  29  LABPROT  --  15.8*  INR  --  1.25  CREATININE 1.1 1.10*    Estimated Creatinine Clearance: 52.4 mL/min (by C-G formula based on Cr of 1.1).   Medical History: Past Medical History  Diagnosis Date  . ALLERGIC RHINITIS 09/12/2007  . BACK PAIN 06/19/2008  . Cramp of limb 08/11/2009  . Long term (current) use of anticoagulants 11/29/2010  . FREQUENCY, URINARY 10/07/2009  . GERD 12/05/2006  . HEMORRHOIDS 11/17/2006  . HIP PAIN, RIGHT, CHRONIC 08/11/2009  . HX, PERSONAL, TUBERCULOSIS 11/17/2006  . HYPERLIPIDEMIA 09/12/2007  . HYPERTENSION 11/17/2006  . INSOMNIA-SLEEP DISORDER-UNSPEC 06/19/2008  . LEG PAIN, RIGHT 06/19/2008  . MASS, SUPERFICIAL 09/12/2007  . MENOPAUSAL DISORDER 09/20/2009  . OVERACTIVE BLADDER 03/04/2010  . PULMONARY EMBOLISM, HX OF 11/17/2006    [place] [facility] s/p Pneumonia  . RECTAL BLEEDING 10/07/2007  . SCIATICA, RIGHT 09/12/2007  . SKIN LESION 07/20/2009  . TMJ SYNDROME 11/17/2006  . Iron deficiency anemia 12/07/2011  . Diverticulosis   . Hyperlipidemia 09/12/2007    Qualifier: Diagnosis of  By: Jenny Reichmann MD, Hunt Oris   . Arthritis   . Colon polyps   . DEEP VENOUS THROMBOPHLEBITIS, LEG, RIGHT 03/04/2010    right leg  . Pneumonia   . Rectal mass 06/2014  . Cancer ([place])     dx. 4'16 -oral chemotherapy only.  . Shortness of breath dyspnea     With exertion since chemotherapy tx-oral meds  . Tuberculosis     pt. was tx.    Medications:  Prescriptions  prior to admission  Medication Sig Dispense Refill Last Dose  . aspirin 81 MG tablet Take 81 mg by mouth at bedtime.    11/17/2015 at 0000  . benazepril (LOTENSIN) 40 MG tablet TAKE 1 TABLET EVERY EVENING 90 tablet 3 11/17/2015 at 0000  . ELIQUIS 5 MG TABS tablet TAKE 1 TABLET TWICE DAILY 180 tablet 3 11/17/2015 at 1500  . estradiol (VIVELLE-DOT) 0.025 MG/24HR APPLY 1 PATCH ONTO THE SKIN TWO TIMES WEEKLY (Patient taking differently: APPLY 1 PATCH ONTO SKIN EVERY SUNDAY.) 24 patch 2 11/14/2015  . ferrous sulfate 325 (65 FE) MG tablet Take 1 tablet (325 mg total) by mouth daily. 180 tablet 3 11/17/2015 at Unknown time  . GLEEVEC 400 MG tablet TAKE 1 TABLET BY MOUTH ONCE DAILY WITH MEALS AND LARGE GLASS OF WATER 30 tablet 2 11/16/2015 at 1800  . loperamide (IMODIUM) 2 MG capsule Take 1-2 capsules (2-4 mg total) by mouth every 8 (eight) hours as needed for diarrhea or loose stools (Use if >2 BM every 8 hours). 30 capsule 0 11/16/2015 at Unknown time  . LORazepam (ATIVAN) 0.5 MG tablet Take 1 tablet (0.5 mg total) by mouth at bedtime. (Patient taking differently: Take 0.5 mg by mouth daily as needed for sleep. ) 30 tablet 0 Past Month at Unknown time  . omeprazole (PRILOSEC) 20 MG capsule  TAKE 1 CAPSULE EVERY EVENING 90 capsule 3 11/17/2015 at 0000  . pravastatin (PRAVACHOL) 20 MG tablet TAKE 1 TABLET AT BEDTIME. MUST KEEP MAY APPT FOR FUTURE REFILLS. 90 tablet 0 11/17/2015 at 0000  . traMADol (ULTRAM) 50 MG tablet Take 1 tablet (50 mg total) by mouth every 6 (six) hours as needed for moderate pain. 65 tablet 5 unknown   Scheduled:  . famotidine (PEPCID) IV  20 mg Intravenous Q12H  . piperacillin-tazobactam (ZOSYN)  IV  3.375 g Intravenous Q8H    Assessment: 35 yoF with PMH DVT/PE on Eliquis, s/p LAR and diverting loop ileostomy for GIST tumor in February  Underwent recent contrast studies d/t rectal discharge and noted possible extravasation on water-soluble exam, although no extravasation noted on CT.   Admitted for observation for possible perforation of colon.   Baseline INR, aPTT wnl; baseline heparin level pending  Prior anticoagulation: Eliquis 5 mg bid; also on ASA 81.  Last dose Eliquis at 1500 today  Significant events:  Today, 11/17/2015:  CBC: Hgb slightly low but appears to be baseline; Plt wnl  No bleeding or infusion issues per nursing  CrCl > 30 ml/min  Goal of Therapy: Heparin level 0.3-0.7 units/ml Monitor platelets by anticoagulation protocol: Yes  Plan:  No bolus  Heparin 1000 units/hr IV infusion  Check aPTT level 6 hrs after start  Daily heparin level while Eliquis clears from system.  If HL undetectable, may switch to monitoring with HL only.  Daily CBC  Monitor for signs of bleeding or thrombosis   Reuel Boom, PharmD Pager: [phone] 11/17/2015, 9:06 PM

## 2015-11-17 NOTE — H&P (Signed)
Brianna Walker is an 70 y.o. female.   Chief Complaint:  HPI: Pt of Dr. Michael Boston, s/p ROBOTIC ASSISTED LYSIS OF ADHESIONS, LOWER ANTERIOR RESECTION TRANS ABDOMINAL AND TRANS ANAL, COLOANAL HANDSEWN ANASTAOSIS, DIVERTING LOPE ILIOSTOMY, RESECTION GIST TUMOR, 06/04/15, hospitalize thru 07/06/15. She is currently on chemotherapy.   She has been having some rectal discharge and was sent for xray studies of her colon with contrast.  They had some trouble cannulating the site.  Report shows:  Difficult exam with contrast filling what appears to be the right colon but the contrast does not proceed in an antegrade fashion possibly due to obstruction. Also there is a question of extravasation.  CT scan was obtained shows: There appears to be some extravasation of contrast from the earlier water-soluble contrast study, with apparently extravasated contrast extending into the right lower quadrant and right pelvis. This suggests perforation of the right colon. The mucosa of the right colon does appear to be prominent and irregular. Since this is a relatively new finding, infection or inflammation would be the primary considerations with tumor much less likely in view of the appearance of the right colon on 06/21/2015.  Contrast does reach the anus, with no extravasation in the anal rectal region noted on today's CT.  No change in probable seroma postoperatively overlying the anterior lower pelvis superficially.  Post procedure:  Dr. Marcello Moores reviewed the studies and recommended admission for observation for possible perforation of the colon.  On exam this PM she is pain free, and mostly concerned with having dinner.     Past Medical History  Diagnosis Date  . ALLERGIC RHINITIS 09/12/2007  . BACK PAIN 06/19/2008  . Cramp of limb 08/11/2009  . Long term (current) use of anticoagulants 11/29/2010  . FREQUENCY, URINARY 10/07/2009  . GERD 12/05/2006  . HEMORRHOIDS 11/17/2006  . HIP PAIN, RIGHT, CHRONIC 08/11/2009  . HX,  PERSONAL, TUBERCULOSIS 11/17/2006  . HYPERLIPIDEMIA 09/12/2007  . HYPERTENSION 11/17/2006  . INSOMNIA-SLEEP DISORDER-UNSPEC 06/19/2008  . LEG PAIN, RIGHT 06/19/2008  . MASS, SUPERFICIAL 09/12/2007  . MENOPAUSAL DISORDER 09/20/2009  . OVERACTIVE BLADDER 03/04/2010  . PULMONARY EMBOLISM, HX OF 11/17/2006    [place] [facility] s/p Pneumonia  . RECTAL BLEEDING 10/07/2007  . SCIATICA, RIGHT 09/12/2007  . SKIN LESION 07/20/2009  . TMJ SYNDROME 11/17/2006  . Iron deficiency anemia 12/07/2011  . Diverticulosis   . Hyperlipidemia 09/12/2007    Qualifier: Diagnosis of  By: Jenny Reichmann MD, Hunt Oris   . Arthritis   . Colon polyps   . DEEP VENOUS THROMBOPHLEBITIS, LEG, RIGHT 03/04/2010    right leg  . Pneumonia   . Rectal mass 06/2014  . Cancer ([place])     dx. 4'16 -oral chemotherapy only.  . Shortness of breath dyspnea     With exertion since chemotherapy tx-oral meds  . Tuberculosis     pt. was tx.    Past Surgical History  Procedure Laterality Date  . Cholecystectomy open    . Abdominal hysterectomy    . Incisional hernia repair  2002      multiple ventral hernia repair/mesh  . Tumor removed off of right shoulder Right   . S/p right hip replaced min invasive hip surgury [facility] oct 2011 Right 2011  . Hemorroid surgery  removed at dr Silvio Pate office    x 2  . Eus N/A 08/06/2014    Procedure: LOWER ENDOSCOPIC ULTRASOUND (EUS);  Surgeon: Milus Banister, MD;  Location: [facility];  Service: Endoscopy;  Laterality: N/A;  . Incisional hernia repair  01/13/2005    open w onlay Proceed mesh.  Mechele Claude robotic assisted lower anterior resection N/A 06/04/2015   Procedure: XI ROBOTIC ASSISTED LYSIS OF ADHESIONS, LOWER ANTERIOR RESECTION TRANS ABDOMINAL AND TRANS ANAL, COLOANAL HANDSEWN ANASTAOSIS, DIVERTING LOPE ILIOSTOMY, RESECTION GIST TUMOR;  Surgeon: Michael Boston, MD;  Location: [facility];  Service: General;  Laterality: N/A;          . Laparoscopic lysis of adhesions  06/04/2015    Procedure: LAPAROSCOPIC  LYSIS OF ADHESIONS;  Surgeon: Michael Boston, MD;  Location: [facility];  Service: General;;    Family History  Problem Relation Age of Onset  . Colon cancer Father   . Heart disease Mother   . Heart disease Sister   . Diabetes Sister    Social History:  reports that she quit smoking about 38 years ago. Her smoking use included Cigarettes. She smoked 0.00 packs per day. She has never used smokeless tobacco. She reports that she drinks alcohol. She reports that she does not use illicit drugs.  Allergies: No Known Allergies  Medications Prior to Admission  Medication Sig Dispense Refill  . aspirin 81 MG tablet Take 81 mg by mouth at bedtime.     . benazepril (LOTENSIN) 40 MG tablet TAKE 1 TABLET EVERY EVENING 90 tablet 3  . ELIQUIS 5 MG TABS tablet TAKE 1 TABLET TWICE DAILY 180 tablet 3  . estradiol (VIVELLE-DOT) 0.025 MG/24HR APPLY 1 PATCH ONTO THE SKIN TWO TIMES WEEKLY (Patient taking differently: APPLY 1 PATCH ONTO SKIN EVERY SUNDAY.) 24 patch 2  . ferrous sulfate 325 (65 FE) MG tablet Take 1 tablet (325 mg total) by mouth daily. 180 tablet 3  . GLEEVEC 400 MG tablet TAKE 1 TABLET BY MOUTH ONCE DAILY WITH MEALS AND LARGE GLASS OF WATER 30 tablet 2  . loperamide (IMODIUM) 2 MG capsule Take 1-2 capsules (2-4 mg total) by mouth every 8 (eight) hours as needed for diarrhea or loose stools (Use if >2 BM every 8 hours). 30 capsule 0  . LORazepam (ATIVAN) 0.5 MG tablet Take 1 tablet (0.5 mg total) by mouth at bedtime. (Patient taking differently: Take 0.5 mg by mouth daily as needed for sleep. ) 30 tablet 0  . omeprazole (PRILOSEC) 20 MG capsule TAKE 1 CAPSULE EVERY EVENING 90 capsule 3  . pravastatin (PRAVACHOL) 20 MG tablet TAKE 1 TABLET AT BEDTIME. MUST KEEP MAY APPT FOR FUTURE REFILLS. 90 tablet 0  . traMADol (ULTRAM) 50 MG tablet Take 1 tablet (50 mg total) by mouth every 6 (six) hours as needed for moderate pain. 65 tablet 5    Results for orders placed or performed in visit on 11/16/15 (from  the past 48 hour(s))  Basic metabolic panel     Status: Abnormal   Collection Time: 11/16/15  2:25 PM  Result Value Ref Range   Sodium 141 136 - 145 mEq/L   Potassium 3.7 3.5 - 5.1 mEq/L   Chloride 109 98 - 109 mEq/L   CO2 24 22 - 29 mEq/L   Glucose 80 70 - 140 mg/dl    Comment: Glucose reference range is for nonfasting patients. Fasting glucose reference range is 70- 100.   BUN 7.2 7.0 - 26.0 mg/dL   Creatinine 1.1 0.6 - 1.1 mg/dL   Calcium 8.8 8.4 - 10.4 mg/dL   Anion Gap 8 3 - 11 mEq/L   EGFR 63 (L) >90 ml/min/1.73 m2    Comment: eGFR is calculated  using the CKD-EPI Creatinine Equation (2009)   Ct Abdomen Pelvis Wo Contrast  11/17/2015  ADDENDUM REPORT: 11/17/2015 13:36 ADDENDUM: I did contact Dr. Leighton Ruff at 5:62 p.m. and we discussed the findings of today's water-soluble study of the colon as well as the CT of the abdomen and pelvis performed after the water-soluble exam. Dr. Marcello Moores at that the patient will probably be admitted to the hospital for observation in view of the questioned extravasation . Electronically Signed   By: Ivar Drape M.D.   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 COMPARISON:  CT abdomen pelvis of 06/21/2015 and water-soluble evaluation of the colon from today FINDINGS: Linear atelectasis is noted in the posterior left lung base. This study is performed as a follow-up to the water-soluble study via the double barrel ileostomy to assess the colon. There is a para ostial hernia containing small bowel with loops of small bowel are not dilated. The previously noted superficial subcutaneous oval lesion within the mid pelvis  is unchanged and most consistent with postoperative hematoma, measuring 8.2 x 12.5 cm. Contrast enters the distal ileum, and there is contrast within the decompressed right colon. The lining of the decompressed right colon appears somewhat irregular, and edema as with inflammation cannot be excluded. This area did not appear edematous on the CT from 06/21/2015, and therefore malignancy is felt to be less likely. There does appear to be some contrast extravasation of the water-soluble contrast into the right lower quadrant and right pelvis. This indicates possible perforation or fistulous communication from the right colon. On this CT which is delayed from the water-soluble the study begun this morning, contrast does proceed antegrade to the anal rectal junction. No extravasation is seen at that site. A small midline ventral hernia is noted containing nondilated portion of the transverse colon. After the CT the patient did go the bathroom. She gave a history of evacuating a lengthy segment of what she described as cotton-like material. Conceivably this could be some packing from the previous surgical intervention. Since she was clinically fine, she was released from our facility, but I did leave a message with the triage desk at [facility] for Dr. Leighton Ruff concerning the findings and possibly discussing suggesting admission in view of the questioned extravasation of contrast despite the patient's normal clinical status currently. The patient did it just contact our facility by telephone. The technologist related to me that the patient has passed additional clotton-like material. I recommended that she contact [facility] and Dr. Leighton Ruff to discuss the next course of action. IMPRESSION: 1. There appears to be some extravasation of contrast from the earlier water-soluble contrast study, with apparently extravasated contrast extending into the right lower quadrant and right pelvis.  This suggests perforation of the right colon. 2. The mucosa of the right colon does appear to be prominent and irregular. Since this is a relatively new finding, infection or inflammation would be the primary considerations with tumor much less likely in view of the appearance of the right colon on 06/21/2015. 3. Contrast does reach the anus, with no extravasation in the anal rectal region noted on today's CT. 4. No change in probable seroma  postoperatively overlying the anterior lower pelvis superficially. Electronically Signed: By: Ivar Drape M.D. On: 11/17/2015 13:24   Dg Colon W/cm - Wo/w Kub  11/17/2015  CLINICAL DATA:  History of just tumor of the anal rectal junction with resection, and postoperative leak at the anastomotic site, followup EXAM: BE WITH CONTRAST - WITHOUT AND WITH KUB CONTRAST:  Water-soluble contrast was utilized. FLUOROSCOPY TIME:  Radiation Exposure Index (as provided by the fluoroscopic device): 352 deciGy per square cm If the device does not provide the exposure index: Fluoroscopy Time (in minutes and seconds):  Hit 5 minutes 36 second Number of Acquired Images: COMPARISON:  CT abdomen pelvis of 06/21/2015 and water-soluble: Study of 08/13/2015 FINDINGS: A preliminary film of the abdomen shows a nonspecific bowel gas pattern. Right total hip replacement is noted. There was difficulty initially cannulating the appropriate ostomy to have contrast enters the colon in an antegrade fashion. Unfortunately the patient had inadvertently obscured the second opening with the ostomy bag. After the correct opening was cannulated, contrast was injected. The contrast did flow into small bowel and entered what appears to be the right colon. However the contrast would not advance in a anterograde fashion and the patient had considerable discomfort and fullness in the right abdomen. Therefore the study was terminated and additional images were obtained. There does appear to be filling of a portion  right colon with some irregularity of that portion of the colon making tumor a possibility. On delayed images there appears to be some extravasation of contrast, and after discussing this case with Dr. Marcello Moores, with Dr. Johney Maine out of town and not available, the decision was made to perform a CT to assess the collection of contrast and possible extravasation in the right colon as well as the possibility obstruction. IMPRESSION: Difficult exam with contrast filling what appears to be the right colon but the contrast does not proceed in an antegrade fashion possibly due to obstruction. Also there is a question of extravasation. As above CT of the abdomen will be performed to assess this area further. Electronically Signed   By: Ivar Drape M.D.   On: 11/17/2015 10:37    Review of Systems  Constitutional: Negative.   HENT: Negative.   Eyes: Negative.   Respiratory: Negative.   Cardiovascular: Negative.   Gastrointestinal: Negative.   Genitourinary: Negative.   Musculoskeletal: Negative.   Skin: Negative.   Neurological: Negative.   Endo/Heme/Allergies: Negative.   Psychiatric/Behavioral: Negative.     Blood pressure 124/78, pulse 103, SpO2 100 %. Physical Exam  Constitutional: She is oriented to person, place, and time. She appears well-developed and well-nourished.  HENT:  Head: Normocephalic and atraumatic.  Nose: Nose normal.  Eyes: Right eye exhibits no discharge. Left eye exhibits no discharge. No scleral icterus.  Neck: Normal range of motion. Neck supple. No JVD present. No tracheal deviation present. No thyromegaly present.  Cardiovascular: Normal rate, regular rhythm, normal heart sounds and intact distal pulses.   No murmur heard. Respiratory: Effort normal and breath sounds normal. No respiratory distress. She has no wheezes. She has no rales. She exhibits no tenderness.  GI: Soft. Bowel sounds are normal. She exhibits no distension and no mass. There is tenderness (minimal  tenderness). There is no rebound and no guarding.  Ostomy working well, all the sites look fine.  Palpable seroma from surgery mid abd.    Musculoskeletal: She exhibits no edema.  Lymphadenopathy:    She has no cervical adenopathy.  Neurological: She is alert  and oriented to person, place, and time. No cranial nerve deficit.  Skin: Skin is warm and dry. No rash noted. No erythema. No pallor.  Psychiatric: She has a normal mood and affect. Her behavior is normal. Judgment and thought content normal.     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     Enzio Buchler, PA-C 11/17/2015, 4:30 PM

## 2015-11-18 DIAGNOSIS — E0520 Thyrotoxicosis with toxic multinodular goiter without thyrotoxic crisis or storm: Secondary | ICD-10-CM | POA: Insufficient documentation

## 2015-11-18 LAB — BASIC METABOLIC PANEL
Anion gap: 5 (ref 5–15)
BUN: 6 mg/dL (ref 6–20)
CALCIUM: 8.4 mg/dL — AB (ref 8.9–10.3)
CO2: 24 mmol/L (ref 22–32)
CREATININE: 0.96 mg/dL (ref 0.44–1.00)
Chloride: 113 mmol/L — ABNORMAL HIGH (ref 101–111)
GFR calc Af Amer: >60 mL/min (ref >60)
GFR, EST NON AFRICAN AMERICAN: 60 mL/min — AB (ref >60)
Glucose, Bld: 91 mg/dL (ref 65–99)
POTASSIUM: 3.1 mmol/L — AB (ref 3.5–5.1)
SODIUM: 142 mmol/L (ref 135–145)

## 2015-11-18 LAB — CBC
HCT: 28.4 % — ABNORMAL LOW (ref 36.0–46.0)
HEMOGLOBIN: 9.6 g/dL — AB (ref 12.0–15.0)
MCH: 28.7 pg (ref 26.0–34.0)
MCHC: 33.8 g/dL (ref 30.0–36.0)
MCV: 84.8 fL (ref 78.0–100.0)
Platelets: 247 10*3/uL (ref 150–400)
RBC: 3.35 MIL/uL — ABNORMAL LOW (ref 3.87–5.11)
RDW: 17.9 % — AB (ref 11.5–15.5)
WBC: 5.3 10*3/uL (ref 4.0–10.5)

## 2015-11-18 LAB — APTT
APTT: 64 s — AB (ref 24–37)
APTT: 64 s — AB (ref 24–37)

## 2015-11-18 LAB — HEPARIN LEVEL (UNFRACTIONATED): HEPARIN UNFRACTIONATED: 2.01 [IU]/mL — AB (ref 0.30–0.70)

## 2015-11-18 LAB — THYROID STIMULATING IMMUNOGLOBULIN: TSI: 134 %{baseline} (ref <140)

## 2015-11-18 MED ORDER — POTASSIUM CHLORIDE CRYS ER 20 MEQ PO TBCR
20.0000 meq | EXTENDED_RELEASE_TABLET | Freq: Two times a day (BID) | ORAL | Status: DC
  Administered 2015-11-18 – 2015-11-19 (×3): 20 meq via ORAL
  Filled 2015-11-18 (×3): qty 1

## 2015-11-18 MED ORDER — BOOST PLUS PO LIQD
237.0000 mL | Freq: Three times a day (TID) | ORAL | Status: DC
  Administered 2015-11-18 – 2015-11-19 (×2): 237 mL via ORAL
  Filled 2015-11-18 (×3): qty 237

## 2015-11-18 MED ORDER — HEPARIN (PORCINE) IN NACL 100-0.45 UNIT/ML-% IJ SOLN
1250.0000 [IU]/h | INTRAMUSCULAR | Status: DC
  Administered 2015-11-19: 1250 [IU]/h via INTRAVENOUS
  Filled 2015-11-18 (×2): qty 250

## 2015-11-18 NOTE — Progress Notes (Signed)
[place] for heparin IV (bridge for apixaban) Indication: Hx VTE  No Known Allergies  Patient Measurements: Height: 5\' 3"  (160 cm) Weight: 195 lb 8.8 oz (88.7 kg) IBW/kg (Calculated) : 52.4 Heparin Dosing Weight: 72.5 kg  Vital Signs: Temp: 98.4 F (36.9 C) (07/20 0516) Temp Source: Oral (07/20 0516) BP: 118/70 mmHg (07/20 0516) Pulse Rate: 56 (07/20 0516)  Labs:  Recent Labs  11/16/15 1425 11/17/15 1651 11/17/15 2130 11/18/15 0455 11/18/15 1012  HGB  --  11.3*  --  9.6*  --   HCT  --  33.5*  --  28.4*  --   PLT  --  290  --  247  --   APTT  --  29  --   --  64*  LABPROT  --  15.8*  --   --   --   INR  --  1.25  --   --   --   HEPARINUNFRC  --   --  2.01*  --   --   CREATININE 1.1 1.10*  --  0.96  --     Estimated Creatinine Clearance: 60.1 mL/min (by C-G formula based on Cr of 0.96).    Scheduled:  . famotidine (PEPCID) IV  20 mg Intravenous Q12H  . piperacillin-tazobactam (ZOSYN)  IV  3.375 g Intravenous Q8H  . potassium chloride  20 mEq Oral BID PC    Assessment: 71 yoF with PMH DVT/PE on Eliquis, s/p LAR and diverting loop ileostomy for GIST tumor in February  Underwent recent contrast studies d/t rectal discharge and noted possible extravasation on water-soluble exam, although no extravasation noted on CT.  Admitted for observation for possible perforation of colon.   Baseline INR, aPTT wnl; baseline heparin level = 2.01  Prior anticoagulation: Eliquis 5 mg bid; also on ASA 81.  Last dose Eliquis at 1500 today  Significant events:  Today, 11/18/2015:  APTT slightly below goal   CBC: Hgb low - down from admission; Plt wnl  No bleeding or infusion issues per nursing  CrCl > 30 ml/min  Goal of Therapy: APTT 66-102 sec Heparin level 0.3-0.7 units/ml Monitor platelets by anticoagulation protocol: Yes  Plan:  Increase Heparin 1100 units/hr IV infusion  Check aPTT level 6 hrs after rate increase  Daily  heparin level while Eliquis clears from system.    Daily CBC  Monitor for signs of bleeding or thrombosis  Possible home tomorrow with PO antibiotics.  Await orders to stop heparin gtt and resume home apixaban  Doreene Eland, PharmD, BCPS.   Pager: [phone] 11/18/2015 11:30 AM

## 2015-11-18 NOTE — Progress Notes (Signed)
Subjective: No complaints, sleeping and comfortable, no abdominal pain.    Objective: Vital signs in last 24 hours: Temp:  [98.4 F (36.9 C)-98.9 F (37.2 C)] 98.4 F (36.9 C) (07/20 0516) Pulse Rate:  [56-103] 56 (07/20 0516) Resp:  [18] 18 (07/20 0516) BP: (118-132)/(68-78) 118/70 mmHg (07/20 0516) SpO2:  [100 %] 100 % (07/20 0516) Weight:  [88.7 kg (195 lb 8.8 oz)] 88.7 kg (195 lb 8.8 oz) (07/19 1630) Last BM Date: 11/17/15 1200 IV 300 urine Stool x 2 Afebrile, VSS K+ 3.1 WBC 5.3   Intake/Output from previous day: 07/19 0701 - 07/20 0700 In: 1251.7 [I.V.:1201.7; IV Piggyback:50] Out: 300 [Urine:300] Intake/Output this shift:    General appearance: alert, cooperative and no distress Resp: clear to auscultation bilaterally GI: soft, non-tender; bowel sounds normal; no masses,  no organomegaly and ostomy working well.    Lab Results:   Recent Labs  11/17/15 1651 11/18/15 0455  WBC 10.6* 5.3  HGB 11.3* 9.6*  HCT 33.5* 28.4*  PLT 290 247    BMET  Recent Labs  11/17/15 1651 11/18/15 0455  NA 138 142  K 3.3* 3.1*  CL 107 113*  CO2 25 24  GLUCOSE 97 91  BUN 8 6  CREATININE 1.10* 0.96  CALCIUM 8.7* 8.4*   PT/INR  Recent Labs  11/17/15 1651  LABPROT 15.8*  INR 1.25     Recent Labs Lab 11/11/15 1456 11/17/15 1651  AST 16 20  ALT 10 13*  ALKPHOS 125 117  BILITOT 1.39* 1.5*  PROT 8.0 7.8  ALBUMIN 3.6 3.9     Lipase     Component Value Date/Time   LIPASE 85* 06/21/2015 0741     Studies/Results: Ct Abdomen Pelvis Wo Contrast  11/17/2015  ADDENDUM REPORT: 11/17/2015 13:36 ADDENDUM: I did contact Dr. Leighton Ruff at [phone] p.m. and we discussed the findings of today's water-soluble study of the colon as well as the CT of the abdomen and pelvis performed after the water-soluble exam. Dr. Marcello Moores at that the patient will probably be admitted to the hospital for observation in view of the questioned extravasation . Electronically Signed   By:  Ivar Drape M.D.   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 COMPARISON:  CT abdomen pelvis of 06/21/2015 and water-soluble evaluation of the colon from today FINDINGS: Linear atelectasis is noted in the posterior left lung base. This study is performed as a follow-up to the water-soluble study via the double barrel ileostomy to assess the colon. There is a para ostial hernia containing small bowel with loops of small bowel are not dilated. The previously noted superficial subcutaneous oval lesion within the mid pelvis is unchanged and most consistent with postoperative hematoma, measuring 8.2 x 12.5 cm. Contrast enters the distal ileum, and there is contrast within the decompressed right colon. The lining of the decompressed right colon appears somewhat irregular, and edema as with inflammation cannot be excluded. This area did not appear edematous on the CT from 06/21/2015, and therefore malignancy is felt to be less likely. There does appear to be some contrast extravasation of the water-soluble contrast into the right lower quadrant and right pelvis. This indicates possible perforation or fistulous  communication from the right colon. On this CT which is delayed from the water-soluble the study begun this morning, contrast does proceed antegrade to the anal rectal junction. No extravasation is seen at that site. A small midline ventral hernia is noted containing nondilated portion of the transverse colon. After the CT the patient did go the bathroom. She gave a history of evacuating a lengthy segment of what she described as cotton-like material. Conceivably this could  be some packing from the previous surgical intervention. Since she was clinically fine, she was released from our facility, but I did leave a message with the triage desk at [facility] for Dr. Leighton Ruff concerning the findings and possibly discussing suggesting admission in view of the questioned extravasation of contrast despite the patient's normal clinical status currently. The patient did it just contact our facility by telephone. The technologist related to me that the patient has passed additional clotton-like material. I recommended that she contact [facility] and Dr. Leighton Ruff to discuss the next course of action. IMPRESSION: 1. There appears to be some extravasation of contrast from the earlier water-soluble contrast study, with apparently extravasated contrast extending into the right lower quadrant and right pelvis. This suggests perforation of the right colon. 2. The mucosa of the right colon does appear to be prominent and irregular. Since this is a relatively new finding, infection or inflammation would be the primary considerations with tumor much less likely in view of the appearance of the right colon on 06/21/2015. 3. Contrast does reach the anus, with no extravasation in the anal rectal region noted on today's CT. 4. No change in probable seroma postoperatively overlying the anterior lower pelvis superficially. Electronically Signed: By: Ivar Drape M.D. On: 11/17/2015 13:24   Dg Colon W/cm - Wo/w Kub  11/17/2015  CLINICAL DATA:  History of just tumor of the anal rectal junction with resection, and postoperative leak at the anastomotic site, followup EXAM: BE WITH CONTRAST - WITHOUT AND WITH KUB CONTRAST:  Water-soluble contrast was utilized. FLUOROSCOPY TIME:  Radiation Exposure Index (as provided by the fluoroscopic device): 352 deciGy per square cm If the device does not provide the exposure index: Fluoroscopy Time (in minutes and seconds):  Hit 5 minutes  36 second Number of Acquired Images: COMPARISON:  CT abdomen pelvis of 06/21/2015 and water-soluble: Study of 08/13/2015 FINDINGS: A preliminary film of the abdomen shows a nonspecific bowel gas pattern. Right total hip replacement is noted. There was difficulty initially cannulating the appropriate ostomy to have contrast enters the colon in an antegrade fashion. Unfortunately the patient had inadvertently obscured the second opening with the ostomy bag. After the correct opening was cannulated, contrast was injected. The contrast did flow into small bowel and entered what appears to be the right colon. However the contrast would not advance in a anterograde fashion and the patient had considerable discomfort and fullness in the right abdomen. Therefore the study was terminated and additional images were obtained. There does appear to be filling of a portion right colon with some irregularity of that portion of the colon making tumor a possibility. On delayed images there appears to be some extravasation of contrast, and after discussing this case with Dr. Marcello Moores, with Dr. Johney Maine out of town and not available, the decision was made to perform a CT to assess the collection of contrast and possible extravasation in the right colon as well as the possibility obstruction. IMPRESSION: Difficult exam with contrast filling what appears to  be the right colon but the contrast does not proceed in an antegrade fashion possibly due to obstruction. Also there is a question of extravasation. As above CT of the abdomen will be performed to assess this area further. Electronically Signed   By: Ivar Drape M.D.   On: 11/17/2015 10:37    Medications: . famotidine (PEPCID) IV  20 mg Intravenous Q12H  . piperacillin-tazobactam (ZOSYN)  IV  3.375 g Intravenous Q8H   . 0.9 % NaCl with KCl 20 mEq / L 100 mL/hr at 11/18/15 0600  . heparin 1,000 Units/hr (11/18/15 0336)    Hypertension GERD Dyslipidemia   Assessment/Plan Possible colon perforation during radiology procedure - observation Hx of LAR with diverting loop ileostomy 06/04/15, Dr. 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 Repeat labs in AM.     LOS: 1 day    Kenzo Ozment 11/18/2015 [phone]

## 2015-11-18 NOTE — Progress Notes (Signed)
Pharmacy: Heparin for hx VTE (bridge for apixaban)  Assessment: Pt is currently on heparin drip at 1100 units/hr and aPTT=64 (goal 66-102 sec). No change in aPTT despite rate change this morning. No IV line problem, no bleeding per RN.  Plan: Will increase heparin rate to 1250 units/hr Will check 6hr aPTT and CBC after rate increase  Thanks, Garnet Sierras, PharmD 11/18/2015  @1912 

## 2015-11-18 NOTE — Progress Notes (Signed)
Initial Nutrition Assessment  DOCUMENTATION CODES:   Obesity unspecified  INTERVENTION:   Provide Boost Plus TID, each provides 360 kcal and 14 g protein. Encourage PO intake RD to continue to monitor   NUTRITION DIAGNOSIS:   Inadequate oral intake related to altered GI function as evidenced by meal completion < 25%.  GOAL:   Patient will meet greater than or equal to 90% of their needs  MONITOR:   PO intake, Supplement acceptance, Labs, Weight trends, I & O's  REASON FOR ASSESSMENT:   Malnutrition Screening Tool    ASSESSMENT:   Pt of Dr. Michael Boston, s/p ROBOTIC ASSISTED LYSIS OF ADHESIONS, LOWER ANTERIOR RESECTION TRANS ABDOMINAL AND TRANS ANAL, COLOANAL HANDSEWN ANASTAOSIS, DIVERTING LOPE ILIOSTOMY, RESECTION GIST TUMOR, 06/04/15, hospitalize thru 07/06/15. She is currently on chemotherapy.  Pt in room with lunch tray of [place] sandwich, mashed potatoes and dessert. Pt states she is unable to finish her sandwich at this time. Pt had eaten one bite. Pt states she did much better with full liquids. Pt states she did not eat a few days PTA. She currently denies nausea.  Pt is willing to try Boost Plus supplements, RD to order.  Per weight history, pt has lost 31 lb since 1/31 (14% wt loss x 6 months, significant for time frame). Nutrition focused physical exam shows no sign of depletion of muscle mass or body fat.  Medications: K-DUR tablet BID Labs reviewed: Low K Mg WNL  Diet Order:  DIET SOFT Room service appropriate?: Yes; Fluid consistency:: Thin  Skin:  Reviewed, no issues  Last BM:  7/20  Height:   Ht Readings from Last 1 Encounters:  11/17/15 5\' 3"  (1.6 m)    Weight:   Wt Readings from Last 1 Encounters:  11/17/15 195 lb 8.8 oz (88.7 kg)    Ideal Body Weight:  52.3 kg  BMI:  Body mass index is 34.65 kg/(m^2).  Estimated Nutritional Needs:   Kcal:  1850-2050  Protein:  80-90g  Fluid:  2L/day  EDUCATION NEEDS:   No education needs  identified at this time  Clayton Bibles, MS, RD, LDN Pager: [phone] After Hours Pager: [phone]

## 2015-11-19 ENCOUNTER — Telehealth: Payer: Self-pay

## 2015-11-19 ENCOUNTER — Telehealth: Payer: Self-pay | Admitting: *Deleted

## 2015-11-19 LAB — CBC
HCT: 28 % — ABNORMAL LOW (ref 36.0–46.0)
Hemoglobin: 9.4 g/dL — ABNORMAL LOW (ref 12.0–15.0)
MCH: 28.5 pg (ref 26.0–34.0)
MCHC: 33.6 g/dL (ref 30.0–36.0)
MCV: 84.8 fL (ref 78.0–100.0)
PLATELETS: 244 10*3/uL (ref 150–400)
RBC: 3.3 MIL/uL — AB (ref 3.87–5.11)
RDW: 17.9 % — AB (ref 11.5–15.5)
WBC: 5.4 10*3/uL (ref 4.0–10.5)

## 2015-11-19 LAB — HEPARIN LEVEL (UNFRACTIONATED): Heparin Unfractionated: 0.67 IU/mL (ref 0.30–0.70)

## 2015-11-19 LAB — BASIC METABOLIC PANEL
Anion gap: 5 (ref 5–15)
BUN: 11 mg/dL (ref 6–20)
CALCIUM: 8.4 mg/dL — AB (ref 8.9–10.3)
CO2: 25 mmol/L (ref 22–32)
Chloride: 112 mmol/L — ABNORMAL HIGH (ref 101–111)
Creatinine, Ser: 1.11 mg/dL — ABNORMAL HIGH (ref 0.44–1.00)
GFR calc Af Amer: 58 mL/min — ABNORMAL LOW (ref >60)
GFR, EST NON AFRICAN AMERICAN: 50 mL/min — AB (ref >60)
GLUCOSE: 124 mg/dL — AB (ref 65–99)
Potassium: 3.7 mmol/L (ref 3.5–5.1)
Sodium: 142 mmol/L (ref 135–145)

## 2015-11-19 LAB — APTT: aPTT: 88 seconds — ABNORMAL HIGH (ref 24–37)

## 2015-11-19 MED ORDER — SACCHAROMYCES BOULARDII 250 MG PO CAPS
250.0000 mg | ORAL_CAPSULE | Freq: Two times a day (BID) | ORAL | Status: DC
  Administered 2015-11-19: 250 mg via ORAL
  Filled 2015-11-19: qty 1

## 2015-11-19 MED ORDER — FERROUS SULFATE 325 (65 FE) MG PO TABS
325.0000 mg | ORAL_TABLET | Freq: Every day | ORAL | Status: DC
  Administered 2015-11-19: 325 mg via ORAL
  Filled 2015-11-19: qty 1

## 2015-11-19 MED ORDER — ASPIRIN EC 81 MG PO TBEC: 81.0000 mg | DELAYED_RELEASE_TABLET | Freq: Every day | ORAL | Status: DC

## 2015-11-19 MED ORDER — APIXABAN 5 MG PO TABS
5.0000 mg | ORAL_TABLET | Freq: Two times a day (BID) | ORAL | Status: DC
  Administered 2015-11-19: 5 mg via ORAL
  Filled 2015-11-19: qty 2

## 2015-11-19 MED ORDER — AMOXICILLIN-POT CLAVULANATE 875-125 MG PO TABS
1.0000 | ORAL_TABLET | Freq: Two times a day (BID) | ORAL | Status: DC
  Administered 2015-11-19: 1 via ORAL
  Filled 2015-11-19: qty 1

## 2015-11-19 MED ORDER — BENAZEPRIL HCL 40 MG PO TABS: 40.0000 mg | ORAL_TABLET | Freq: Every evening | ORAL | Status: DC

## 2015-11-19 MED ORDER — TRAMADOL HCL 50 MG PO TABS: 50.0000 mg | ORAL_TABLET | Freq: Four times a day (QID) | ORAL | Status: DC | PRN

## 2015-11-19 MED ORDER — AMOXICILLIN-POT CLAVULANATE 875-125 MG PO TABS: 1.0000 | ORAL_TABLET | Freq: Two times a day (BID) | ORAL | Status: DC

## 2015-11-19 MED ORDER — PANTOPRAZOLE SODIUM 40 MG PO TBEC
40.0000 mg | DELAYED_RELEASE_TABLET | Freq: Every day | ORAL | Status: DC
  Administered 2015-11-19: 40 mg via ORAL
  Filled 2015-11-19 (×2): qty 1

## 2015-11-19 MED ORDER — SACCHAROMYCES BOULARDII 250 MG PO CAPS: ORAL_CAPSULE | ORAL | Status: DC

## 2015-11-19 NOTE — Progress Notes (Signed)
[place] for heparin IV to apixaban Indication: Hx VTE  No Known Allergies  Patient Measurements: Height: 5\' 3"  (160 cm) Weight: 195 lb 8.8 oz (88.7 kg) IBW/kg (Calculated) : 52.4 Heparin Dosing Weight: 72.5 kg  Vital Signs: Temp: 98.3 F (36.8 C) (07/21 0600) Temp Source: Oral (07/21 0600) BP: 115/78 mmHg (07/21 0600) Pulse Rate: 60 (07/21 0600)  Labs:  Recent Labs  11/17/15 1651 11/17/15 2130 11/18/15 0455 11/18/15 1012 11/18/15 1734 11/19/15 0141  HGB 11.3*  --  9.6*  --   --  9.4*  HCT 33.5*  --  28.4*  --   --  28.0*  PLT 290  --  247  --   --  244  APTT 29  --   --  64* 64* 88*  LABPROT 15.8*  --   --   --   --   --   INR 1.25  --   --   --   --   --   HEPARINUNFRC  --  2.01*  --   --   --  0.67  CREATININE 1.10*  --  0.96  --   --  1.11*    Estimated Creatinine Clearance: 51.9 mL/min (by C-G formula based on Cr of 1.11).    Scheduled:  . amoxicillin-clavulanate  1 tablet Oral Q12H  . apixaban  5 mg Oral BID  . aspirin  81 mg Oral QHS  . benazepril  40 mg Oral QPM  . famotidine (PEPCID) IV  20 mg Intravenous Q12H  . ferrous sulfate  325 mg Oral Daily  . lactose free nutrition  237 mL Oral TID WC  . pantoprazole  40 mg Oral Daily  . potassium chloride  20 mEq Oral BID PC  . saccharomyces boulardii  250 mg Oral BID    Assessment: 83 yoF with PMH DVT/PE on Eliquis, s/p LAR and diverting loop ileostomy for GIST tumor in February  Underwent recent contrast studies d/t rectal discharge and noted possible extravasation on water-soluble exam, although no extravasation noted on CT.  Admitted for observation for possible perforation of colon.   Baseline INR, aPTT wnl; baseline heparin level = 2.01  Prior anticoagulation: Eliquis 5 mg bid; also on ASA 81.  Last dose Eliquis at 1500 today  Significant events:  Today, 11/19/2015:  Heparin infusing at 1250 units/hr which achieved a therapeutic aPTT x 1 last  night.  CBC: Hgb low - down from admission but stable; Plt wnl  No bleeding or infusion issues per nursing  CrCl > 30 ml/min  Goal of Therapy: APTT 66-102 sec Heparin level 0.3-0.7 units/ml Monitor platelets by anticoagulation protocol: Yes  Plan:  Orders to resume apixaban and d/c heparin infusion  Resume apixaban 5mg  PO BID  D/C heparin gtt when 1st dose apixaban given  D/C heparin labs  Doreene Eland, PharmD, BCPS.   Pager: [phone] 11/19/2015 9:27 AM

## 2015-11-19 NOTE — Telephone Encounter (Signed)
LM for pt to rtn call in regards to lab results.

## 2015-11-19 NOTE — Progress Notes (Signed)
Discharge instructions and prescriptions given to patient .  Questions answered 

## 2015-11-19 NOTE — Progress Notes (Signed)
Subjective: Patient looks and feels very good in this a.m. She has no abdominal complaints. She is tolerating a regular diet. Her ostomy is working properly and there are no current issues.  Objective: Vital signs in last 24 hours: Temp:  [97.8 F (36.6 C)-98.5 F (36.9 C)] 98.3 F (36.8 C) (07/21 0600) Pulse Rate:  [60-82] 60 (07/21 0600) Resp:  [16-18] 18 (07/21 0600) BP: (115-132)/(70-78) 115/78 mmHg (07/21 0600) SpO2:  [100 %] 100 % (07/21 0600) Last BM Date: 11/18/15 Afebrile, VSS Labs are stable, no WBC elevation Intake/Output from previous day: 07/20 0701 - 07/21 0700 In: 2589.1 [P.O.:960; I.V.:1329.1; IV Piggyback:300] Out: 2800 [Urine:2800] Intake/Output this shift:    General appearance: alert, cooperative and no distress Resp: clear to auscultation bilaterally GI: soft, non-tender; bowel sounds normal; no masses,  no organomegaly and ostomy working well  Lab Results:   Recent Labs  11/18/15 0455 11/19/15 0141  WBC 5.3 5.4  HGB 9.6* 9.4*  HCT 28.4* 28.0*  PLT 247 244    BMET  Recent Labs  11/18/15 0455 11/19/15 0141  NA 142 142  K 3.1* 3.7  CL 113* 112*  CO2 24 25  GLUCOSE 91 124*  BUN 6 11  CREATININE 0.96 1.11*  CALCIUM 8.4* 8.4*   PT/INR  Recent Labs  11/17/15 1651  LABPROT 15.8*  INR 1.25     Recent Labs Lab 11/17/15 1651  AST 20  ALT 13*  ALKPHOS 117  BILITOT 1.5*  PROT 7.8  ALBUMIN 3.9     Lipase     Component Value Date/Time   LIPASE 85* 06/21/2015 0741     Studies/Results: Ct Abdomen Pelvis Wo Contrast  11/17/2015  ADDENDUM REPORT: 11/17/2015 13:36 ADDENDUM: I did contact Dr. Leighton Ruff at [phone] p.m. and we discussed the findings of today's water-soluble study of the colon as well as the CT of the abdomen and pelvis performed after the water-soluble exam. Dr. Marcello Moores at that the patient will probably be admitted to the hospital for observation in view of the questioned extravasation . Electronically Signed   By:  Ivar Drape M.D.   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 COMPARISON:  CT abdomen pelvis of 06/21/2015 and water-soluble evaluation of the colon from today FINDINGS: Linear atelectasis is noted in the posterior left lung base. This study is performed as a follow-up to the water-soluble study via the double barrel ileostomy to assess the colon. There is a para ostial hernia containing small bowel with loops of small bowel are not dilated. The previously noted superficial subcutaneous oval lesion within the mid pelvis is unchanged and most consistent with postoperative hematoma, measuring 8.2 x 12.5 cm. Contrast enters the distal ileum, and there is contrast within the decompressed right colon. The lining of the decompressed right colon appears somewhat irregular, and edema as with inflammation cannot be excluded. This area did not appear edematous on the CT from 06/21/2015, and therefore malignancy is felt to be less likely. There does appear to be some contrast extravasation of the water-soluble contrast into the right lower quadrant and right pelvis. This indicates possible perforation or fistulous communication from the right colon. On this CT which is delayed  from the water-soluble the study begun this morning, contrast does proceed antegrade to the anal rectal junction. No extravasation is seen at that site. A small midline ventral hernia is noted containing nondilated portion of the transverse colon. After the CT the patient did go the bathroom. She gave a history of evacuating a lengthy segment of what she described as cotton-like material. Conceivably this could  be some packing from the previous surgical intervention. Since she was clinically fine, she was released from our facility, but I did leave a message with the triage desk at [facility] for Dr. Leighton Ruff concerning the findings and possibly discussing suggesting admission in view of the questioned extravasation of contrast despite the patient's normal clinical status currently. The patient did it just contact our facility by telephone. The technologist related to me that the patient has passed additional clotton-like material. I recommended that she contact [facility] and Dr. Leighton Ruff to discuss the next course of action. IMPRESSION: 1. There appears to be some extravasation of contrast from the earlier water-soluble contrast study, with apparently extravasated contrast extending into the right lower quadrant and right pelvis. This suggests perforation of the right colon. 2. The mucosa of the right colon does appear to be prominent and irregular. Since this is a relatively new finding, infection or inflammation would be the primary considerations with tumor much less likely in view of the appearance of the right colon on 06/21/2015. 3. Contrast does reach the anus, with no extravasation in the anal rectal region noted on today's CT. 4. No change in probable seroma postoperatively overlying the anterior lower pelvis superficially. Electronically Signed: By: Ivar Drape M.D. On: 11/17/2015 13:24   Dg Colon W/cm - Wo/w Kub  11/17/2015  CLINICAL DATA:  History of just tumor of the anal rectal junction with resection, and postoperative leak at the anastomotic site, followup EXAM: BE WITH CONTRAST - WITHOUT AND WITH KUB CONTRAST:  Water-soluble contrast was utilized. FLUOROSCOPY TIME:  Radiation Exposure Index (as provided by the fluoroscopic device): 352 deciGy per square cm If the device does not provide the exposure index: Fluoroscopy Time (in minutes and seconds):  Hit 5 minutes  36 second Number of Acquired Images: COMPARISON:  CT abdomen pelvis of 06/21/2015 and water-soluble: Study of 08/13/2015 FINDINGS: A preliminary film of the abdomen shows a nonspecific bowel gas pattern. Right total hip replacement is noted. There was difficulty initially cannulating the appropriate ostomy to have contrast enters the colon in an antegrade fashion. Unfortunately the patient had inadvertently obscured the second opening with the ostomy bag. After the correct opening was cannulated, contrast was injected. The contrast did flow into small bowel and entered what appears to be the right colon. However the contrast would not advance in a anterograde fashion and the patient had considerable discomfort and fullness in the right abdomen. Therefore the study was terminated and additional images were obtained. There does appear to be filling of a portion right colon with some irregularity of that portion of the colon making tumor a possibility. On delayed images there appears to be some extravasation of contrast, and after discussing this case with Dr. Marcello Moores, with Dr. Johney Maine out of town and not available, the decision was made to perform a CT to assess the collection of contrast and possible extravasation in the right colon as well as the possibility obstruction. IMPRESSION: Difficult exam with contrast filling what appears to be the right colon but the contrast does not proceed in  an antegrade fashion possibly due to obstruction. Also there is a question of extravasation. As above CT of the abdomen will be performed to assess this area further. Electronically Signed   By: Ivar Drape M.D.   On: 11/17/2015 10:37    Medications: . famotidine (PEPCID) IV  20 mg Intravenous Q12H  . lactose free nutrition  237 mL Oral TID WC  . piperacillin-tazobactam (ZOSYN)  IV  3.375 g Intravenous Q8H  . potassium chloride  20 mEq Oral BID PC    Assessment/Plan Possible colon perforation during radiology procedure -  observation Hx of LAR with diverting loop ileostomy 06/04/15, Dr. 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 Resume Eliquis and discontinue IV heparin. Home this a.m. Follow-up with Dr. Johney Maine.  LOS: 2 days    Brianna Walker 11/19/2015 [phone]

## 2015-11-19 NOTE — Telephone Encounter (Signed)
Called and left message for patient to call back for lab results. °

## 2015-11-19 NOTE — Progress Notes (Signed)
ANTICOAGULATION CONSULT NOTE - Follow Up Consult  Pharmacy Consult for Heparin Indication: hx of VTE (apixaban bridge)  No Known Allergies  Patient Measurements: Height: 5\' 3"  (160 cm) Weight: 195 lb 8.8 oz (88.7 kg) IBW/kg (Calculated) : 52.4 Heparin Dosing Weight:   Vital Signs: Temp: 97.8 F (36.6 C) (07/20 2130) Temp Source: Oral (07/20 2130) BP: 120/70 mmHg (07/20 2130) Pulse Rate: 82 (07/20 2130)  Labs:  Recent Labs  11/17/15 1651 11/17/15 2130 11/18/15 0455 11/18/15 1012 11/18/15 1734 11/19/15 0141  HGB 11.3*  --  9.6*  --   --  9.4*  HCT 33.5*  --  28.4*  --   --  28.0*  PLT 290  --  247  --   --  244  APTT 29  --   --  64* 64* 88*  LABPROT 15.8*  --   --   --   --   --   INR 1.25  --   --   --   --   --   HEPARINUNFRC  --  2.01*  --   --   --  0.67  CREATININE 1.10*  --  0.96  --   --  1.11*    Estimated Creatinine Clearance: 51.9 mL/min (by C-G formula based on Cr of 1.11).   Medications:  Infusions:  . 0.9 % NaCl with KCl 20 mEq / L 50 mL/hr at 11/19/15 0200  . heparin 1,250 Units/hr (11/19/15 0306)    Assessment: Patient with PTT and heparin level at goal.  However, PTT in middle of goal range while heparin level at upper end of goal range.  Will continue to check both still for now. No heparin issues noted.  Goal of Therapy:  Heparin level 0.3-0.7 units/ml aPTT 66-102 seconds Monitor platelets by anticoagulation protocol: Yes   Plan:  Continue heparin drip at current rate Recheck level PTT/HL at [place], Shea Stakes [place] 11/19/2015,5:55 AM

## 2015-11-19 NOTE — Telephone Encounter (Signed)
-----   Message from Owens Shark, NP sent at 11/16/2015  4:14 PM EDT ----- Please let her know labs look better. Follow-up as scheduled.

## 2015-11-19 NOTE — Discharge Summary (Signed)
Physician Discharge Summary  Patient ID: Brianna Walker MRN: [id number] DOB/AGE: 1946/01/17 70 y.o.  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 Johney Maine GIST tumor on chemotherapy Hx of PE/chronic anticoagulation Hypertension GERD Dyslipidemia   Active Problems:   Perforation of colon ([place])   PROCEDURES: None  Hospital Course:  Pt of Dr. Michael Boston, s/p ROBOTIC ASSISTED LYSIS OF ADHESIONS, LOWER ANTERIOR RESECTION TRANS ABDOMINAL AND TRANS ANAL, COLOANAL HANDSEWN ANASTAOSIS, DIVERTING LOPE ILIOSTOMY, RESECTION GIST TUMOR, 06/04/15, hospitalize thru 07/06/15. She is currently on chemotherapy.  She has been having some rectal discharge and was sent for xray studies of her colon with contrast. They had some trouble cannulating the site. Report shows: Difficult exam with contrast filling what appears to be the right colon but the contrast does not proceed in an antegrade fashion possibly due to obstruction. Also there is a question of extravasation. CT scan was obtained shows: There appears to be some extravasation of contrast from the earlier water-soluble contrast study, with apparently extravasated contrast extending into the right lower quadrant and right pelvis. This suggests perforation of the right colon. The mucosa of the right colon does appear to be prominent and irregular. Since this is a relatively new finding, infection or inflammation would be the primary considerations with tumor much less likely in view of the appearance of the right colon  on 06/21/2015. Contrast does reach the anus, with no extravasation in the anal rectal region noted on today's CT. No change in probable seroma postoperatively overlying the anterior lower pelvis superficially.  Post procedure: Dr. Marcello Moores reviewed the studies and recommended admission for observation for possible perforation of the colon. On exam this PM she is pain free, and mostly concerned with having dinner.  She was admitted for observation.  She was started on Zosyn and kept NPO.  The following AM, she remained without any pain, afebrile and labs remained normal.  Her diet was advanced and kept on Zosyn.  She had no issues, repeat labs remain normal, no abdominal pain and no fever.  She was discharged home on the 2nd morning of her admission.  We sent her home on 1 week of Augmentin and a probiotic.  She is to follow up with Dr. Johney Maine.   Condition on d/c:  Improved    CBC Latest Ref Rng 11/19/2015 11/18/2015 11/17/2015  WBC 4.0 - 10.5 K/uL 5.4 5.3 10.6(H)  Hemoglobin 12.0 - 15.0 g/dL 9.4(L) 9.6(L) 11.3(L)  Hematocrit 36.0 - 46.0 % 28.0(L) 28.4(L) 33.5(L)  Platelets 150 - 400 K/uL 244 247 290   CMP Latest Ref Rng 11/19/2015 11/18/2015 11/17/2015  Glucose 65 - 99 mg/dL 124(H) 91 97  BUN 6 - 20 mg/dL 11 6 8   Creatinine 0.44 - 1.00 mg/dL 1.11(H) 0.96 1.10(H)  Sodium 135 - 145 mmol/L 142 142 138  Potassium 3.5 - 5.1 mmol/L 3.7 3.1(L) 3.3(L)  Chloride 101 - 111 mmol/L 112(H) 113(H) 107  CO2 22 - 32 mmol/L 25 24 25   Calcium 8.9 - 10.3 mg/dL 8.4(L) 8.4(L) 8.7(L)  Total Protein 6.5 - 8.1 g/dL - - 7.8  Total Bilirubin 0.3 - 1.2 mg/dL - - 1.5(H)  Alkaline Phos 38 -  126 U/L - - 117  AST 15 - 41 U/L - - 20  ALT 14 - 54 U/L - - 13(L)    Disposition: 01-Home or Self Care     Medication List    TAKE these medications        amoxicillin-clavulanate 875-125 MG tablet  Commonly known as:  AUGMENTIN  Take 1 tablet by mouth every 12 (twelve) hours.     aspirin 81 MG tablet  Take 81 mg by  mouth at bedtime.     benazepril 40 MG tablet  Commonly known as:  LOTENSIN  TAKE 1 TABLET EVERY EVENING     ELIQUIS 5 MG Tabs tablet  Generic drug:  apixaban  TAKE 1 TABLET TWICE DAILY     estradiol 0.025 MG/24HR  Commonly known as:  VIVELLE-DOT  APPLY 1 PATCH ONTO THE SKIN TWO TIMES WEEKLY     ferrous sulfate 325 (65 FE) MG tablet  Take 1 tablet (325 mg total) by mouth daily.     GLEEVEC 400 MG tablet  Generic drug:  imatinib  TAKE 1 TABLET BY MOUTH ONCE DAILY WITH MEALS AND LARGE GLASS OF WATER     loperamide 2 MG capsule  Commonly known as:  IMODIUM  Take 1-2 capsules (2-4 mg total) by mouth every 8 (eight) hours as needed for diarrhea or loose stools (Use if >2 BM every 8 hours).     LORazepam 0.5 MG tablet  Commonly known as:  ATIVAN  Take 1 tablet (0.5 mg total) by mouth at bedtime.     omeprazole 20 MG capsule  Commonly known as:  PRILOSEC  TAKE 1 CAPSULE EVERY EVENING     pravastatin 20 MG tablet  Commonly known as:  PRAVACHOL  TAKE 1 TABLET AT BEDTIME. MUST KEEP MAY APPT FOR FUTURE REFILLS.     saccharomyces boulardii 250 MG capsule  Commonly known as:  FLORASTOR  You can buy this over the counter at any drug store and take for the next 2 weeks.     traMADol 50 MG tablet  Commonly known as:  ULTRAM  Take 1 tablet (50 mg total) by mouth every 6 (six) hours as needed for moderate pain.           Follow-up Information    Follow up with Adin [name]., MD.   Specialty:  General Surgery   Why:  Call and make an appointment if you do not already have one for 1-2 weeks.   Contact information:   [address] [place] [address] [phone]       Signed: Earnstine Regal 11/19/2015, 3:02 PM

## 2015-11-19 NOTE — Discharge Instructions (Signed)
Low-Fiber Diet for at least another week Fiber is found in fruits, vegetables, and whole grains. A low-fiber diet restricts fibrous foods that are not digested in the small intestine. A diet containing about 10-15 grams of fiber per day is considered low fiber. Low-fiber diets may be used to:  Promote healing and rest the bowel during intestinal flare-ups.  Prevent blockage of a partially obstructed or narrowed gastrointestinal tract.  Reduce fecal weight and volume.  Slow the movement of feces. You may be on a low-fiber diet as a transitional diet following surgery, after an injury (trauma), or because of a short (acute) or lifelong (chronic) illness. Your health care provider will determine the length of time you need to stay on this diet.  WHAT DO I NEED TO KNOW ABOUT A LOW-FIBER DIET? Always check the fiber content on the packaging's Nutrition Facts label, especially on foods from the grains list. Ask your dietitian if you have questions about specific foods that are related to your condition, especially if the food is not listed below. In general, a low-fiber food will have less than 2 g of fiber. WHAT FOODS CAN I EAT? Grains All breads and crackers made with white flour. Sweet rolls, doughnuts, waffles, pancakes, [place] toast, bagels. Pretzels, Melba toast, zwieback. Well-cooked cereals, such as cornmeal, farina, or cream cereals. Dry cereals that do not contain whole grains, fruit, or nuts, such as refined corn, wheat, rice, and oat cereals. Potatoes prepared any way without skins, plain pastas and noodles, refined white rice. Use white flour for baking and making sauces. Use allowed list of grains for casseroles, dumplings, and puddings.  Vegetables Strained tomato and vegetable juices. Fresh lettuce, cucumber, spinach. Well-cooked (no skin or pulp) or canned vegetables, such as asparagus, bean sprouts, beets, carrots, green beans, mushrooms, potatoes, pumpkin, spinach, yellow squash, tomato  sauce/puree, turnips, yams, and zucchini. Keep servings limited to  cup.  Fruits All fruit juices except prune juice. Cooked or canned fruits without skin and seeds, such as applesauce, apricots, cherries, fruit cocktail, grapefruit, grapes, mandarin oranges, melons, peaches, pears, pineapple, and plums. Fresh fruits without skin, such as apricots, avocados, bananas, melons, pineapple, nectarines, and peaches. Keep servings limited to  cup or 1 piece.  Meat and Other Protein Sources Ground or well-cooked tender beef, ham, veal, lamb, pork, or poultry. Eggs, plain cheese. Fish, oysters, shrimp, lobster, and other seafood. Liver, organ meats. Smooth nut butters. Dairy All milk products and alternative dairy substitutes, such as soy, rice, almond, and coconut, not containing added whole nuts, seeds, or added fruit. Beverages Decaf coffee, fruit, and vegetable juices or smoothies (small amounts, with no pulp or skins, and with fruits from allowed list), sports drinks, herbal tea. Condiments Ketchup, mustard, vinegar, cream sauce, cheese sauce, cocoa powder. Spices in moderation, such as allspice, basil, bay leaves, celery powder or leaves, cinnamon, cumin powder, curry powder, ginger, mace, marjoram, onion or garlic powder, oregano, paprika, parsley flakes, ground pepper, rosemary, sage, savory, tarragon, thyme, and turmeric. Sweets and Desserts Plain cakes and cookies, pie made with allowed fruit, pudding, custard, cream pie. Gelatin, fruit, ice, sherbet, frozen ice pops. Ice cream, ice milk without nuts. Plain hard candy, honey, jelly, molasses, syrup, sugar, chocolate syrup, gumdrops, marshmallows. Limit overall sugar intake.  Fats and Oil Margarine, butter, cream, mayonnaise, salad oils, plain salad dressings made from allowed foods. Choose healthy fats such as olive oil, canola oil, and omega-3 fatty acids (such as found in salmon or tuna) when possible.  Other Bouillon,  broth, or cream soups  made from allowed foods. Any strained soup. Casseroles or mixed dishes made with allowed foods. The items listed above may not be a complete list of recommended foods or beverages. Contact your dietitian for more options.  WHAT FOODS ARE NOT RECOMMENDED? Grains All whole wheat and whole grain breads and crackers. Multigrains, rye, bran seeds, nuts, or coconut. Cereals containing whole grains, multigrains, bran, coconut, nuts, raisins. Cooked or dry oatmeal, steel-cut oats. Coarse wheat cereals, granola. Cereals advertised as high fiber. Potato skins. Whole grain pasta, wild or brown rice. Popcorn. Coconut flour. Bran, buckwheat, corn bread, multigrains, rye, wheat germ.  Vegetables Fresh, cooked or canned vegetables, such as artichokes, asparagus, beet greens, broccoli, Brussels sprouts, cabbage, celery, cauliflower, corn, eggplant, kale, legumes or beans, okra, peas, and tomatoes. Avoid large servings of any vegetables, especially raw vegetables.  Fruits Fresh fruits, such as apples with or without skin, berries, cherries, figs, grapes, grapefruit, guavas, kiwis, mangoes, oranges, papayas, pears, persimmons, pineapple, and pomegranate. Prune juice and juices with pulp, stewed or dried prunes. Dried fruits, dates, raisins. Fruit seeds or skins. Avoid large servings of all fresh fruits. Meats and Other Protein Sources Tough, fibrous meats with gristle. Chunky nut butter. Cheese made with seeds, nuts, or other foods not recommended. Nuts, seeds, legumes (beans, including baked beans), dried peas, beans, lentils.  Dairy Yogurt or cheese that contains nuts, seeds, or added fruit.  Beverages Fruit juices with high pulp, prune juice. Caffeinated coffee and teas.  Condiments Coconut, maple syrup, pickles, olives. Sweets and Desserts Desserts, cookies, or candies that contain nuts or coconut, chunky peanut butter, dried fruits. Jams, preserves with seeds, marmalade. Large amounts of sugar and sweets. Any  other dessert made with fruits from the not recommended list.  Other Soups made from vegetables that are not recommended or that contain other foods not recommended.  The items listed above may not be a complete list of foods and beverages to avoid. Contact your dietitian for more information.   This information is not intended to replace advice given to you by your health care provider. Make sure you discuss any questions you have with your health care provider.   Document Released: 10/07/2001 Document Revised: 04/22/2013 Document Reviewed: 03/10/2013 Elsevier Interactive Patient Education 2016 [employer].   Call Dr.Gross's office if you have any issues.

## 2015-11-19 NOTE — Telephone Encounter (Signed)
-----   Message from Philemon Kingdom, MD sent at 11/18/2015  5:55 PM EDT ----- Brianna Walker, can you please call pt: All the thyroid tests are now back, and it appears that her TSH is low again, while the rest of the labs are normal. At this point, I would suggest to obtain a thyroid uptake and scan, which I discussed with her about at last visit. This will give [place] a better idea about what parts of the thyroid are more active. I ordered this to be done at [facility]. I will let her know about the results as soon as they are back. No intervention needed for now since her TSH is only mildly low. Also, please ask her if she has an active mychart, as it does appear active for me but no results appear to have been reviewed by her.Marland KitchenMarland Kitchen

## 2015-11-24 DIAGNOSIS — Z932 Ileostomy status: Secondary | ICD-10-CM | POA: Diagnosis not present

## 2015-11-24 DIAGNOSIS — C182 Malignant neoplasm of ascending colon: Secondary | ICD-10-CM | POA: Diagnosis not present

## 2015-11-29 ENCOUNTER — Other Ambulatory Visit: Payer: Self-pay | Admitting: *Deleted

## 2015-11-29 MED ORDER — ESTRADIOL 0.025 MG/24HR TD PTTW: MEDICATED_PATCH | TRANSDERMAL | 2 refills | Status: DC

## 2015-12-07 ENCOUNTER — Encounter (HOSPITAL_COMMUNITY)
Admit: 2015-12-07 | Discharge: 2015-12-07 | Disposition: A | Discharge status: Home / Self Care | Payer: Commercial Managed Care - HMO | Attending: Internal Medicine | Admitting: Internal Medicine

## 2015-12-07 ENCOUNTER — Telehealth: Payer: Self-pay | Admitting: Emergency Medicine

## 2015-12-07 ENCOUNTER — Ambulatory Visit (INDEPENDENT_AMBULATORY_CARE_PROVIDER_SITE_OTHER): Payer: Commercial Managed Care - HMO | Admitting: Internal Medicine

## 2015-12-07 ENCOUNTER — Encounter: Payer: Self-pay | Admitting: Emergency Medicine

## 2015-12-07 ENCOUNTER — Encounter: Payer: Self-pay | Admitting: Internal Medicine

## 2015-12-07 DIAGNOSIS — E0590 Thyrotoxicosis, unspecified without thyrotoxic crisis or storm: Secondary | ICD-10-CM | POA: Insufficient documentation

## 2015-12-07 DIAGNOSIS — E785 Hyperlipidemia, unspecified: Secondary | ICD-10-CM

## 2015-12-07 DIAGNOSIS — R198 Other specified symptoms and signs involving the digestive system and abdomen: Secondary | ICD-10-CM | POA: Diagnosis not present

## 2015-12-07 DIAGNOSIS — D509 Iron deficiency anemia, unspecified: Secondary | ICD-10-CM

## 2015-12-07 DIAGNOSIS — Z932 Ileostomy status: Secondary | ICD-10-CM | POA: Diagnosis not present

## 2015-12-07 MED ORDER — SODIUM IODIDE I 131 CAPSULE
9.7000 | Freq: Once | INTRAVENOUS | Status: AC | PRN
Start: 2015-12-07 — End: 2015-12-07
  Administered 2015-12-07: 9.7 via ORAL

## 2015-12-07 MED ORDER — ESTRADIOL 0.025 MG/24HR TD PTTW: MEDICATED_PATCH | TRANSDERMAL | 2 refills | Status: DC

## 2015-12-07 MED FILL — GLEEVEC 400 MG TABLET: 400 | 30 days supply | Qty: 30 | Fill #1

## 2015-12-07 NOTE — Progress Notes (Signed)
Subjective:    Patient ID: Brianna Walker, female    DOB: [birth year], 70 y.o.   MRN: [id number]  HPI  Here to f/u; overall doing ok,  Pt denies chest pain, increasing sob or doe, wheezing, orthopnea, PND, increased LE swelling, palpitations, dizziness or syncope.  Pt denies new neurological symptoms such as new headache, or facial or extremity weakness or numbness.  Pt denies polydipsia, polyuria, or low sugar episode.   Pt denies new neurological symptoms such as new headache, or facial or extremity weakness or numbness.   Pt states overall good compliance with meds  Here after recent hospsn for colon rupture post procedure, did not require surgury, good compliance with antibx.  Still being considered for ileostomy takedown.   Starting radioactive iodine for hyperthyroid; Denies hyper or hypo thyroid symptoms such as voice, skin or hair change.  Past Medical History:  Diagnosis Date  . ALLERGIC RHINITIS 09/12/2007  . Arthritis   . BACK PAIN 06/19/2008  . Cancer ([place])    dx. 4'16 -oral chemotherapy only.  . Colon polyps   . Cramp of limb 08/11/2009  . DEEP VENOUS THROMBOPHLEBITIS, LEG, RIGHT 03/04/2010   right leg  . Diverticulosis   . FREQUENCY, URINARY 10/07/2009  . GERD 12/05/2006  . HEMORRHOIDS 11/17/2006  . HIP PAIN, RIGHT, CHRONIC 08/11/2009  . HX, PERSONAL, TUBERCULOSIS 11/17/2006  . HYPERLIPIDEMIA 09/12/2007  . Hyperlipidemia 09/12/2007   Qualifier: Diagnosis of  By: Jenny Reichmann MD, Hunt Oris   . HYPERTENSION 11/17/2006  . INSOMNIA-SLEEP DISORDER-UNSPEC 06/19/2008  . Iron deficiency anemia 12/07/2011  . LEG PAIN, RIGHT 06/19/2008  . Long term (current) use of anticoagulants 11/29/2010  . MASS, SUPERFICIAL 09/12/2007  . MENOPAUSAL DISORDER 09/20/2009  . OVERACTIVE BLADDER 03/04/2010  . Pneumonia   . PULMONARY EMBOLISM, HX OF 11/17/2006   [facility] s/p Pneumonia  . RECTAL BLEEDING 10/07/2007  . Rectal mass 06/2014  . SCIATICA, RIGHT 09/12/2007  . Shortness of breath dyspnea    With exertion since chemotherapy tx-oral meds  . SKIN LESION 07/20/2009  . TMJ SYNDROME 11/17/2006  . Tuberculosis    pt. was tx.   Past Surgical History:  Procedure Laterality Date  . ABDOMINAL HYSTERECTOMY    . CHOLECYSTECTOMY OPEN    . EUS N/A 08/06/2014   Procedure: LOWER ENDOSCOPIC ULTRASOUND (EUS);  Surgeon: Milus Banister, MD;  Location: [facility];  Service: Endoscopy;  Laterality: N/A;  . hemorroid surgery  removed at dr Silvio Pate office   x 2  . [place]  2002     multiple ventral hernia repair/mesh  . INCISIONAL HERNIA REPAIR  01/13/2005   open w onlay Proceed mesh.  Marland Kitchen LAPAROSCOPIC LYSIS OF ADHESIONS  06/04/2015   Procedure: LAPAROSCOPIC LYSIS OF ADHESIONS;  Surgeon: Michael Boston, MD;  Location: [facility];  Service: General;;  . s/p right hip replaced min invasive hip surgury [facility] oct 2011 Right 2011  . tumor removed off of right shoulder Right   . XI ROBOTIC ASSISTED LOWER ANTERIOR RESECTION N/A 06/04/2015   Procedure: XI ROBOTIC ASSISTED LYSIS OF ADHESIONS, LOWER ANTERIOR RESECTION TRANS ABDOMINAL AND TRANS ANAL, COLOANAL HANDSEWN ANASTAOSIS, DIVERTING LOPE ILIOSTOMY, RESECTION GIST TUMOR;  Surgeon: Michael Boston, MD;  Location: [facility];  Service: General;  Laterality: N/A;    reports that she quit smoking about 38 years ago. Her smoking use included Cigarettes. She smoked 0.00 packs per day. She has never used smokeless tobacco. She reports that she drinks alcohol. She reports  that she does not use drugs. family history includes Colon cancer in her father; Diabetes in her sister; Heart disease in her mother and sister. No Known Allergies Current Outpatient Prescriptions on File Prior to Visit  Medication Sig Dispense Refill  . aspirin 81 MG tablet Take 81 mg by mouth at bedtime.     . benazepril (LOTENSIN) 40 MG tablet TAKE 1 TABLET EVERY EVENING 90 tablet 3  . ELIQUIS 5 MG TABS tablet TAKE 1 TABLET TWICE DAILY 180 tablet 3  . ferrous sulfate 325 (65 FE) MG  tablet Take 1 tablet (325 mg total) by mouth daily. 180 tablet 3  . GLEEVEC 400 MG tablet TAKE 1 TABLET BY MOUTH ONCE DAILY WITH MEALS AND LARGE GLASS OF WATER 30 tablet 2  . loperamide (IMODIUM) 2 MG capsule Take 1-2 capsules (2-4 mg total) by mouth every 8 (eight) hours as needed for diarrhea or loose stools (Use if >2 BM every 8 hours). 30 capsule 0  . LORazepam (ATIVAN) 0.5 MG tablet Take 1 tablet (0.5 mg total) by mouth at bedtime. (Patient taking differently: Take 0.5 mg by mouth daily as needed for sleep. ) 30 tablet 0  . omeprazole (PRILOSEC) 20 MG capsule TAKE 1 CAPSULE EVERY EVENING 90 capsule 3  . pravastatin (PRAVACHOL) 20 MG tablet TAKE 1 TABLET AT BEDTIME. MUST KEEP MAY APPT FOR FUTURE REFILLS. 90 tablet 0  . saccharomyces boulardii (FLORASTOR) 250 MG capsule You can buy this over the counter at any drug store and take for the next 2 weeks.    . traMADol (ULTRAM) 50 MG tablet Take 1 tablet (50 mg total) by mouth every 6 (six) hours as needed for moderate pain. 65 tablet 5   No current facility-administered medications on file prior to visit.    Review of Systems  All otherwise neg per pt     Objective:   Physical Exam BP 118/72   Pulse 74   Temp 98.3 F (36.8 C) (Oral)   Resp 20   Wt 195 lb (88.5 kg)   SpO2 99%   BMI 34.54 kg/m  VS noted,  Constitutional: Pt appears in no apparent distress HENT: Head: NCAT.  Right Ear: External ear normal.  Left Ear: External ear normal.  Eyes: . Pupils are equal, round, and reactive to light. Conjunctivae and EOM are normal Neck: Normal range of motion. Neck supple.  Cardiovascular: Normal rate and regular rhythm.   Pulmonary/Chest: Effort normal and breath sounds without rales or wheezing.  Abd:  Soft, NT, ND, + BS Neurological: Pt is alert. Not confused , motor grossly intact Skin: Skin is warm. No rash, no LE edema Psychiatric: Pt behavior is normal. No agitation.   Most recent CT November 17 2015 IMPRESSION: 1. There appears to  be some extravasation of contrast from the earlier water-soluble contrast study, with apparently extravasated contrast extending into the right lower quadrant and right pelvis. This suggests perforation of the right colon. 2. The mucosa of the right colon does appear to be prominent and irregular. Since this is a relatively new finding, infection or inflammation would be the primary considerations with tumor much less likely in view of the appearance of the right colon on 06/21/2015. 3. Contrast does reach the anus, with no extravasation in the anal rectal region noted on today's CT. 4. No change in probable seroma postoperatively overlying the anterior lower pelvis superficially.  Electronically Signed: By: Ivar Drape M.D. On: 11/17/2015 13:24     Assessment &  Plan:

## 2015-12-07 NOTE — Assessment & Plan Note (Signed)
stable overall by history and exam, recent data reviewed with pt, and pt to continue medical treatment as before,  to f/u any worsening symptoms or concerns Lab Results  Component Value Date   LDLCALC 81 05/28/2012

## 2015-12-07 NOTE — Progress Notes (Signed)
Pre visit review using our clinic review tool, if applicable. No additional management support is needed unless otherwise documented below in the visit note. 

## 2015-12-07 NOTE — Telephone Encounter (Signed)
Called about the prescription estradiol (VIVELLE-DOT) 0.025 MG/24HR. It states apply once weekly but these patches are meant to be applied twice weekly. Please follow up thanks.

## 2015-12-07 NOTE — Telephone Encounter (Signed)
Called [facility] spoke w/pharmacist inform pt is only using once a week...Brianna Walker

## 2015-12-07 NOTE — Telephone Encounter (Signed)
The pharmacist states since pt is only using 1 patch weekly they will send her the patch that she can apply and it will last for 7 days. Quantity will be 16 w/2 refills updated pt med list.../lmb

## 2015-12-07 NOTE — Assessment & Plan Note (Signed)
For f/u surgury regarding timing of takedown, pt hopes soon

## 2015-12-07 NOTE — Patient Instructions (Signed)
Please continue all other medications as before, and refills have been done if requested.  Please have the pharmacy call with any other refills you may need.  Please continue your efforts at being more active, low cholesterol diet, and weight control.  Please keep your appointments with your specialists as you may have planned     

## 2015-12-07 NOTE — Assessment & Plan Note (Signed)
stable overall by history and exam, recent data reviewed with pt, and pt to continue medical treatment as before,  to f/u any worsening symptoms or concerns Lab Results  Component Value Date   HGBA1C 5.6 06/01/2015

## 2015-12-08 ENCOUNTER — Encounter (HOSPITAL_COMMUNITY)
Admit: 2015-12-08 | Discharge: 2015-12-08 | Disposition: A | Discharge status: Home / Self Care | Payer: Commercial Managed Care - HMO | Attending: Internal Medicine | Admitting: Internal Medicine

## 2015-12-08 DIAGNOSIS — E042 Nontoxic multinodular goiter: Secondary | ICD-10-CM | POA: Diagnosis not present

## 2015-12-08 DIAGNOSIS — E0590 Thyrotoxicosis, unspecified without thyrotoxic crisis or storm: Secondary | ICD-10-CM | POA: Diagnosis not present

## 2015-12-09 NOTE — Addendum Note (Signed)
Addended by: Philemon Kingdom on: 12/09/2015 12:43 PM   Modules accepted: Orders

## 2015-12-13 ENCOUNTER — Ambulatory Visit (HOSPITAL_BASED_OUTPATIENT_CLINIC_OR_DEPARTMENT_OTHER): Payer: Commercial Managed Care - HMO | Admitting: Oncology

## 2015-12-13 ENCOUNTER — Telehealth: Payer: Self-pay | Admitting: Oncology

## 2015-12-13 ENCOUNTER — Other Ambulatory Visit (HOSPITAL_BASED_OUTPATIENT_CLINIC_OR_DEPARTMENT_OTHER): Payer: Commercial Managed Care - HMO

## 2015-12-13 VITALS — BP 118/77 | HR 90 | Temp 97.9°F | Resp 17 | Ht 63.0 in | Wt 195.2 lb

## 2015-12-13 DIAGNOSIS — C49A4 Gastrointestinal stromal tumor of large intestine: Secondary | ICD-10-CM

## 2015-12-13 DIAGNOSIS — R7989 Other specified abnormal findings of blood chemistry: Secondary | ICD-10-CM | POA: Diagnosis not present

## 2015-12-13 LAB — CBC WITH DIFFERENTIAL/PLATELET
BASO%: 0.6 % (ref 0.0–2.0)
Basophils Absolute: 0 10*3/uL (ref 0.0–0.1)
EOS ABS: 0.2 10*3/uL (ref 0.0–0.5)
EOS%: 2.4 % (ref 0.0–7.0)
HCT: 33.3 % — ABNORMAL LOW (ref 34.8–46.6)
HEMOGLOBIN: 10.9 g/dL — AB (ref 11.6–15.9)
LYMPH%: 27.7 % (ref 14.0–49.7)
MCH: 28.7 pg (ref 25.1–34.0)
MCHC: 32.6 g/dL (ref 31.5–36.0)
MCV: 87.9 fL (ref 79.5–101.0)
MONO#: 0.2 10*3/uL (ref 0.1–0.9)
MONO%: 3.5 % (ref 0.0–14.0)
NEUT%: 65.8 % (ref 38.4–76.8)
NEUTROS ABS: 4.4 10*3/uL (ref 1.5–6.5)
Platelets: 273 10*3/uL (ref 145–400)
RBC: 3.79 10*6/uL (ref 3.70–5.45)
RDW: 16.1 % — AB (ref 11.2–14.5)
WBC: 6.7 10*3/uL (ref 3.9–10.3)
lymph#: 1.8 10*3/uL (ref 0.9–3.3)

## 2015-12-13 LAB — COMPREHENSIVE METABOLIC PANEL
ALBUMIN: 3.4 g/dL — AB (ref 3.5–5.0)
ALT: 9 U/L (ref 0–55)
AST: 14 U/L (ref 5–34)
Alkaline Phosphatase: 114 U/L (ref 40–150)
Anion Gap: 6 mEq/L (ref 3–11)
BUN: 13.9 mg/dL (ref 7.0–26.0)
CALCIUM: 8.9 mg/dL (ref 8.4–10.4)
CHLORIDE: 115 meq/L — AB (ref 98–109)
CO2: 21 meq/L — AB (ref 22–29)
Creatinine: 1.3 mg/dL — ABNORMAL HIGH (ref 0.6–1.1)
EGFR: 47 mL/min/{1.73_m2} — AB (ref >90)
GLUCOSE: 101 mg/dL (ref 70–140)
POTASSIUM: 4.4 meq/L (ref 3.5–5.1)
SODIUM: 143 meq/L (ref 136–145)
Total Bilirubin: 0.85 mg/dL (ref 0.20–1.20)
Total Protein: 7.6 g/dL (ref 6.4–8.3)

## 2015-12-13 MED ORDER — LORAZEPAM 0.5 MG PO TABS: 0.5000 mg | ORAL_TABLET | Freq: Every evening | ORAL | 0 refills | Status: DC | PRN

## 2015-12-13 NOTE — Telephone Encounter (Signed)
GAVE PATIENT AVS REPORT AND APPOINTMENTS FOR September.  °

## 2015-12-13 NOTE — Progress Notes (Signed)
  [place] OFFICE PROGRESS NOTE   Diagnosis: Gastric intestinal stromal tumor  INTERVAL HISTORY:   Ms. Bunte returns as scheduled. She continues Gleevec. She reports frequent bowel movements, improved with Imodium. She underwent a contrast study of the colon in radiology last month and was admitted with a possible perforation of the right colon. She was discharged home to complete a course of Augmentin and probiotic. She reports increased rectal "discharge "following this procedure has now resolved. Good appetite.   Objective:  Vital signs in last 24 hours:  Blood pressure 118/77, pulse 90, temperature 97.9 F (36.6 C), temperature source Oral, resp. rate 17, height 5\' 3"  (1.6 m), weight 195 lb 3.2 oz (88.5 kg), SpO2 100 %.    HEENT: No thrush or ulcers Resp: Lungs clear bilaterally Cardio: Regular rate and rhythm GI: No hepatosplenomegaly, nontender, right lower quadrant ileostomy Vascular: No leg edema  Skin: Yeast rash in the groin     Lab Results:  Lab Results  Component Value Date   WBC 6.7 12/13/2015   HGB 10.9 (L) 12/13/2015   HCT 33.3 (L) 12/13/2015   MCV 87.9 12/13/2015   PLT 273 12/13/2015   NEUTROABS 4.4 12/13/2015  Potassium 4.4, BUN 13.9, creatinine 1.3   Medications: I have reviewed the patient's current medications.  Assessment/Plan: 1. Gastrointestinal stromal tumor of the rectum  Status post an endoscopic ultrasound on 08/06/2014 confirming a mass abutting the distal rectal wall with an FNA biopsy confirming a gastrointestinal stromal tumor  Initiation of Gleevec 09/04/2014  MRI pelvis 12/15/2014 with interval decreased size of the large anorectal mass with central necrosis.  Continuation of Gleevec  Gleevec placed on hold 06/02/2015 in anticipation of surgery  06/04/2015 status post low anterior resection and diverting loop ileostomy, resection of tumor  Pathology: 9.5 cm tumor; GISTsubtype spindle; mitotic rate 1/50  high-power field; positive margin of resection  Adjuvant Gleevec 08/19/2015 (three-year in total course planned) 2. Remote history of pulmonary embolism-maintained on apixaban 3. Multiple colon polyps noted on the colonoscopy 07/21/2014 with the pathology revealing tubular adenomas 4. Hospitalization 06/21/2015 through 07/06/2015 with nausea/vomiting, high ileostomy output; found to have delayed gastric emptying. 5. Water soluble contrast enema 08/13/2015 positive for a small to moderate volume of water soluble contrast leakage from the low anterior resection and anastomosis site in the pelvis    Disposition:  Ms. Haverland appears stable. She will continue Gleevec. There is mild elevation of the BUN and creatinine today. This could be related to mild dehydration, Benzapril, or underlying renal insufficiency. The threatening has been mildly elevated in the past.  She will return for an office and lab visit in one month.  Betsy Coder, MD  12/13/2015  4:21 PM

## 2015-12-14 ENCOUNTER — Ambulatory Visit: Payer: Self-pay | Admitting: Surgery

## 2015-12-14 DIAGNOSIS — K6289 Other specified diseases of anus and rectum: Secondary | ICD-10-CM | POA: Diagnosis not present

## 2015-12-14 DIAGNOSIS — C49A4 Gastrointestinal stromal tumor of large intestine: Secondary | ICD-10-CM | POA: Diagnosis not present

## 2015-12-14 DIAGNOSIS — T888XXS Other specified complications of surgical and medical care, not elsewhere classified, sequela: Secondary | ICD-10-CM | POA: Diagnosis not present

## 2015-12-14 DIAGNOSIS — T792XXS Traumatic secondary and recurrent hemorrhage and seroma, sequela: Secondary | ICD-10-CM | POA: Diagnosis not present

## 2015-12-15 ENCOUNTER — Other Ambulatory Visit: Payer: Self-pay | Admitting: Surgery

## 2015-12-15 ENCOUNTER — Ambulatory Visit
Admission: RE | Admit: 2015-12-15 | Discharge: 2015-12-15 | Disposition: A | Discharge status: Home / Self Care | Payer: Commercial Managed Care - HMO | Source: Ambulatory Visit | Attending: Internal Medicine | Admitting: Internal Medicine

## 2015-12-15 ENCOUNTER — Telehealth: Payer: Self-pay

## 2015-12-15 DIAGNOSIS — C49A4 Gastrointestinal stromal tumor of large intestine: Secondary | ICD-10-CM

## 2015-12-15 DIAGNOSIS — E042 Nontoxic multinodular goiter: Secondary | ICD-10-CM | POA: Diagnosis not present

## 2015-12-15 NOTE — Telephone Encounter (Signed)
See note

## 2015-12-17 ENCOUNTER — Telehealth: Payer: Self-pay | Admitting: Internal Medicine

## 2015-12-17 ENCOUNTER — Telehealth: Payer: Self-pay

## 2015-12-17 NOTE — Telephone Encounter (Signed)
Called patient at home, voicemail box was full. Will try again later.

## 2015-12-17 NOTE — Telephone Encounter (Signed)
Called patient and spoke with her about ultrasound results. Patient notified of needing to have biopsy completed, she had no problem with that. I will notify MD. Patient states she is using the mychart, but will check to make sure she is using it correctly.

## 2015-12-17 NOTE — Telephone Encounter (Signed)
PT returning your phone call, she requests call back on her home phone #

## 2015-12-17 NOTE — Telephone Encounter (Signed)
Called and left message for patient to call back regarding the uptake scan she had completed. Gave call back number.

## 2015-12-22 DIAGNOSIS — C182 Malignant neoplasm of ascending colon: Secondary | ICD-10-CM | POA: Diagnosis not present

## 2015-12-22 DIAGNOSIS — Z932 Ileostomy status: Secondary | ICD-10-CM | POA: Diagnosis not present

## 2015-12-24 DIAGNOSIS — C182 Malignant neoplasm of ascending colon: Secondary | ICD-10-CM | POA: Diagnosis not present

## 2015-12-24 DIAGNOSIS — Z932 Ileostomy status: Secondary | ICD-10-CM | POA: Diagnosis not present

## 2016-01-04 ENCOUNTER — Ambulatory Visit
Admission: RE | Admit: 2016-01-04 | Discharge: 2016-01-04 | Disposition: A | Discharge status: Home / Self Care | Payer: Commercial Managed Care - HMO | Source: Ambulatory Visit | Attending: Surgery | Admitting: Surgery

## 2016-01-04 ENCOUNTER — Other Ambulatory Visit: Payer: Self-pay | Admitting: Surgery

## 2016-01-04 DIAGNOSIS — C49A4 Gastrointestinal stromal tumor of large intestine: Secondary | ICD-10-CM | POA: Diagnosis not present

## 2016-01-07 MED FILL — GLEEVEC 400 MG TABLET: 400 | 30 days supply | Qty: 30 | Fill #2

## 2016-01-10 ENCOUNTER — Other Ambulatory Visit: Payer: Commercial Managed Care - HMO

## 2016-01-10 ENCOUNTER — Ambulatory Visit: Payer: Commercial Managed Care - HMO | Admitting: Nurse Practitioner

## 2016-01-17 ENCOUNTER — Telehealth: Payer: Self-pay | Admitting: Oncology

## 2016-01-17 ENCOUNTER — Other Ambulatory Visit (HOSPITAL_BASED_OUTPATIENT_CLINIC_OR_DEPARTMENT_OTHER): Payer: Commercial Managed Care - HMO

## 2016-01-17 ENCOUNTER — Ambulatory Visit (HOSPITAL_BASED_OUTPATIENT_CLINIC_OR_DEPARTMENT_OTHER): Payer: Commercial Managed Care - HMO | Admitting: Nurse Practitioner

## 2016-01-17 VITALS — BP 128/82 | HR 70 | Temp 98.1°F | Resp 17 | Ht 63.0 in | Wt 199.1 lb

## 2016-01-17 DIAGNOSIS — C49A4 Gastrointestinal stromal tumor of large intestine: Secondary | ICD-10-CM

## 2016-01-17 DIAGNOSIS — Z86711 Personal history of pulmonary embolism: Secondary | ICD-10-CM

## 2016-01-17 LAB — CBC WITH DIFFERENTIAL/PLATELET
BASO%: 0.5 % (ref 0.0–2.0)
Basophils Absolute: 0 10*3/uL (ref 0.0–0.1)
EOS ABS: 0.1 10*3/uL (ref 0.0–0.5)
EOS%: 1.7 % (ref 0.0–7.0)
HCT: 31.5 % — ABNORMAL LOW (ref 34.8–46.6)
HEMOGLOBIN: 10.3 g/dL — AB (ref 11.6–15.9)
LYMPH%: 25.5 % (ref 14.0–49.7)
MCH: 29.3 pg (ref 25.1–34.0)
MCHC: 32.6 g/dL (ref 31.5–36.0)
MCV: 89.9 fL (ref 79.5–101.0)
MONO#: 0.3 10*3/uL (ref 0.1–0.9)
MONO%: 4.6 % (ref 0.0–14.0)
NEUT%: 67.7 % (ref 38.4–76.8)
NEUTROS ABS: 4 10*3/uL (ref 1.5–6.5)
PLATELETS: 338 10*3/uL (ref 145–400)
RBC: 3.5 10*6/uL — ABNORMAL LOW (ref 3.70–5.45)
RDW: 14.5 % (ref 11.2–14.5)
WBC: 5.9 10*3/uL (ref 3.9–10.3)
lymph#: 1.5 10*3/uL (ref 0.9–3.3)

## 2016-01-17 LAB — COMPREHENSIVE METABOLIC PANEL
ALBUMIN: 3.3 g/dL — AB (ref 3.5–5.0)
ALK PHOS: 125 U/L (ref 40–150)
ALT: 10 U/L (ref 0–55)
AST: 15 U/L (ref 5–34)
Anion Gap: 9 mEq/L (ref 3–11)
BILIRUBIN TOTAL: 0.58 mg/dL (ref 0.20–1.20)
BUN: 10.2 mg/dL (ref 7.0–26.0)
CO2: 24 mEq/L (ref 22–29)
CREATININE: 1.2 mg/dL — AB (ref 0.6–1.1)
Calcium: 9.3 mg/dL (ref 8.4–10.4)
Chloride: 111 mEq/L — ABNORMAL HIGH (ref 98–109)
EGFR: 53 mL/min/{1.73_m2} — AB (ref >90)
GLUCOSE: 79 mg/dL (ref 70–140)
Potassium: 4.4 mEq/L (ref 3.5–5.1)
SODIUM: 145 meq/L (ref 136–145)
TOTAL PROTEIN: 8 g/dL (ref 6.4–8.3)

## 2016-01-17 NOTE — Progress Notes (Signed)
  [place] OFFICE PROGRESS NOTE   Diagnosis:  Gastrointestinal stromal tumor  INTERVAL HISTORY:   Brianna Walker returns as scheduled. She continues to [employer]. She denies nausea/vomiting. No mouth sores. She has intermittent loose stools. She takes Imodium as needed with good control. No rash. She reports she is scheduled to have the ostomy reversed 01/28/2016.  Objective:  Vital signs in last 24 hours:  Blood pressure 128/82, pulse 70, temperature 98.1 F (36.7 C), temperature source Oral, resp. rate 17, height 5\' 3"  (1.6 m), weight 199 lb 1.6 oz (90.3 kg), SpO2 100 %.    HEENT: No thrush or ulcers. Resp: Lungs clear bilaterally. Cardio: Regular rate and rhythm. GI: Abdomen soft and nontender. No organomegaly. Right lower quadrant ileostomy. Vascular: No leg edema.   Lab Results:  Lab Results  Component Value Date   WBC 5.9 01/17/2016   HGB 10.3 (L) 01/17/2016   HCT 31.5 (L) 01/17/2016   MCV 89.9 01/17/2016   PLT 338 01/17/2016   NEUTROABS 4.0 01/17/2016    Imaging:  No results found.  Medications: I have reviewed the patient's current medications.  Assessment/Plan: 1. Gastrointestinal stromal tumor of the rectum  Status post an endoscopic ultrasound on 08/06/2014 confirming a mass abutting the distal rectal wall with an FNA biopsy confirming a gastrointestinal stromal tumor  Initiation of Gleevec 09/04/2014  MRI pelvis 12/15/2014 with interval decreased size of the large anorectal mass with central necrosis.  Continuation of Gleevec  Gleevec placed on hold 06/02/2015 in anticipation of surgery  06/04/2015 status post low anterior resection and diverting loop ileostomy, resection of tumor  Pathology: 9.5 cm tumor; GISTsubtype spindle; mitotic rate 1/50 high-power field; positive margin of resection  Adjuvant Gleevec 08/19/2015 (three-year in total course planned) 2. Remote history of pulmonary embolism-maintained on apixaban 3. Multiple  colon polyps noted on the colonoscopy 07/21/2014 with the pathology revealing tubular adenomas 4. Hospitalization 06/21/2015 through 07/06/2015 with nausea/vomiting, high ileostomy output; found to have delayed gastric emptying. 5. Water soluble contrast enema 08/13/2015 positive for a small to moderate volume of water soluble contrast leakage from the low anterior resection and anastomosis site in the pelvis   Disposition: Brianna Walker appears stable. She will continue Gleevec. We scheduled a return visit in one month.  She is scheduled for reversal of the ostomy later this month. Dr. Benay Spice recommends she hold [place] beginning 2 days prior to surgery and resume at the time of discharge.    Ned Card ANP/GNP-BC   01/17/2016  3:16 PM

## 2016-01-17 NOTE — Telephone Encounter (Signed)
Gave patient avs report and appointments for October  °

## 2016-01-18 ENCOUNTER — Telehealth: Payer: Self-pay

## 2016-01-18 NOTE — Telephone Encounter (Signed)
-----   Message from Owens Shark, NP sent at 01/17/2016  4:44 PM EDT ----- Please instruct her to hold [place] beginning 2 days prior to surgery and resume at the time of discharge.

## 2016-01-18 NOTE — Telephone Encounter (Signed)
Called and informed pt to hold gleevec 2 days prior to surgery and resume at time of discharge, pt verbalized understanding and denies any questions or concerns at this time.

## 2016-01-21 ENCOUNTER — Telehealth: Payer: Self-pay | Admitting: Emergency Medicine

## 2016-01-21 NOTE — Telephone Encounter (Signed)
Ok to stop the eliquis 48 hrs prior to the procedure

## 2016-01-21 NOTE — Telephone Encounter (Signed)
Pt called and is scheduled to have surgery 9/29 and wants to know when a good time to stop her ELIQUIS 5 MG TABS tablet would be. Please advise thanks.

## 2016-01-21 NOTE — Telephone Encounter (Signed)
Pt informed and stated understanding

## 2016-01-26 ENCOUNTER — Encounter (HOSPITAL_COMMUNITY)
Admission: RE | Admit: 2016-01-26 | Discharge: 2016-01-26 | Disposition: A | Discharge status: Home / Self Care | Payer: Commercial Managed Care - HMO | Source: Ambulatory Visit | Attending: Surgery | Admitting: Surgery

## 2016-01-26 ENCOUNTER — Encounter (HOSPITAL_COMMUNITY): Payer: Self-pay

## 2016-01-26 LAB — BASIC METABOLIC PANEL
ANION GAP: 4 — AB (ref 5–15)
BUN: 10 mg/dL (ref 6–20)
CALCIUM: 8.8 mg/dL — AB (ref 8.9–10.3)
CO2: 25 mmol/L (ref 22–32)
Chloride: 113 mmol/L — ABNORMAL HIGH (ref 101–111)
Creatinine, Ser: 1.16 mg/dL — ABNORMAL HIGH (ref 0.44–1.00)
GFR, EST AFRICAN AMERICAN: 55 mL/min — AB (ref >60)
GFR, EST NON AFRICAN AMERICAN: 47 mL/min — AB (ref >60)
GLUCOSE: 88 mg/dL (ref 65–99)
POTASSIUM: 4.2 mmol/L (ref 3.5–5.1)
SODIUM: 142 mmol/L (ref 135–145)

## 2016-01-26 LAB — CBC
HCT: 31.4 % — ABNORMAL LOW (ref 36.0–46.0)
Hemoglobin: 10.3 g/dL — ABNORMAL LOW (ref 12.0–15.0)
MCH: 29.6 pg (ref 26.0–34.0)
MCHC: 32.8 g/dL (ref 30.0–36.0)
MCV: 90.2 fL (ref 78.0–100.0)
PLATELETS: 350 10*3/uL (ref 150–400)
RBC: 3.48 MIL/uL — ABNORMAL LOW (ref 3.87–5.11)
RDW: 14.7 % (ref 11.5–15.5)
WBC: 6.7 10*3/uL (ref 4.0–10.5)

## 2016-01-26 LAB — PROTIME-INR
INR: 0.94
Prothrombin Time: 12.6 seconds (ref 11.4–15.2)

## 2016-01-26 NOTE — Patient Instructions (Addendum)
Brianna Walker  01/26/2016   Your procedure is scheduled on: Friday 01/28/2016  Report to [facility]  Entrance take [place]  elevators to 3rd floor to  [place] at  0830  AM.  Call this number if you have problems the morning of surgery [phone]   Remember: ONLY 1 PERSON MAY GO WITH YOU TO SHORT STAY TO GET  READY MORNING OF [place].              FOLLOW BOWEL PREP INSTRUCTIONS FROM DR.GROSS OFFICE ALONG WITH A CLEAR LIQUID DIET ALL DAY THE DAY BEFORE SURGERY ( on Thursday 01/27/2016)!    CLEAR LIQUID DIET   Foods Allowed                                                                     Foods Excluded  Coffee and tea, regular and decaf                             liquids that you cannot  Plain Jell-O in any flavor                                             see through such as: Fruit ices (not with fruit pulp)                                     milk, soups, orange juice  Iced Popsicles                                    All solid food Carbonated beverages, regular and diet                                    Cranberry, grape and apple juices Sports drinks like Gatorade Lightly seasoned clear broth or consume(fat free) Sugar, honey syrup  Sample Menu Breakfast                                Lunch                                     Supper Cranberry juice                    Beef broth                            Chicken broth Jell-O  Grape juice                           Apple juice Coffee or tea                        Jell-O                                      Popsicle                                                Coffee or tea                        Coffee or tea  _____________________________________________________________________     Do not eat food or drink liquids :After Midnight.     Take these medicines the morning of surgery with A SIP OF WATER: none                          You may not have any metal on your body including hair pins and              piercings  Do not wear jewelry, make-up, lotions, powders or perfumes, deodorant             Do not wear nail polish.  Do not shave  48 hours prior to surgery.              Men may shave face and neck.   Do not bring valuables to the hospital. [place].  Contacts, dentures or bridgework may not be worn into surgery.  Leave suitcase in the car. After surgery it may be brought to your room.     Patients discharged the day of surgery will not be allowed to drive home.  Name and phone number of your driver:  Special Instructions: N/A              Please read over the following fact sheets you were given: _____________________________________________________________________             [facility] - Preparing for Surgery Before surgery, you can play an important role.  Because skin is not sterile, your skin needs to be as free of germs as possible.  You can reduce the number of germs on your skin by washing with CHG (chlorahexidine gluconate) soap before surgery.  CHG is an antiseptic cleaner which kills germs and bonds with the skin to continue killing germs even after washing. Please DO NOT use if you have an allergy to CHG or antibacterial soaps.  If your skin becomes reddened/irritated stop using the CHG and inform your nurse when you arrive at Short Stay. Do not shave (including legs and underarms) for at least 48 hours prior to the first CHG shower.  You may shave your face/neck. Please follow these instructions carefully:  1.  Shower with CHG Soap the night before surgery and the  morning of Surgery.  2.  If you choose to wash your hair, wash your hair first as  usual with your  normal  shampoo.  3.  After you shampoo, rinse your hair and body thoroughly to remove the  shampoo.                           4.  Use CHG as you would any other liquid soap.  You can  apply chg directly  to the skin and wash                       Gently with a scrungie or clean washcloth.  5.  Apply the CHG Soap to your body ONLY FROM THE NECK DOWN.   Do not use on face/ open                           Wound or open sores. Avoid contact with eyes, ears mouth and genitals (private parts).                       Wash face,  Genitals (private parts) with your normal soap.             6.  Wash thoroughly, paying special attention to the area where your surgery  will be performed.  7.  Thoroughly rinse your body with warm water from the neck down.  8.  DO NOT shower/wash with your normal soap after using and rinsing off  the CHG Soap.                9.  Pat yourself dry with a clean towel.            10.  Wear clean pajamas.            11.  Place clean sheets on your bed the night of your first shower and do not  sleep with pets. Day of Surgery : Do not apply any lotions/deodorants the morning of surgery.  Please wear clean clothes to the hospital/surgery center.  FAILURE TO FOLLOW THESE INSTRUCTIONS MAY RESULT IN THE CANCELLATION OF YOUR SURGERY PATIENT SIGNATURE_________________________________  NURSE SIGNATURE__________________________________  ________________________________________________________________________  WHAT IS A BLOOD TRANSFUSION? Blood Transfusion Information  A transfusion is the replacement of blood or some of its parts. Blood is made up of multiple cells which provide different functions.  Red blood cells carry oxygen and are used for blood loss replacement.  White blood cells fight against infection.  Platelets control bleeding.  Plasma helps clot blood.  Other blood products are available for specialized needs, such as hemophilia or other clotting disorders. BEFORE THE TRANSFUSION  Who gives blood for transfusions?   Healthy volunteers who are fully evaluated to make sure their blood is safe. This is blood bank blood. Transfusion therapy is  the safest it has ever been in the practice of medicine. Before blood is taken from a donor, a complete history is taken to make sure that person has no history of diseases nor engages in risky social behavior (examples are intravenous drug use or sexual activity with multiple partners). The donor's travel history is screened to minimize risk of transmitting infections, such as malaria. The donated blood is tested for signs of infectious diseases, such as HIV and hepatitis. The blood is then tested to be sure it is compatible with you in order to minimize the chance of a transfusion reaction. If you or a relative donates blood, this is often done  in anticipation of surgery and is not appropriate for emergency situations. It takes many days to process the donated blood. RISKS AND COMPLICATIONS Although transfusion therapy is very safe and saves many lives, the main dangers of transfusion include:   Getting an infectious disease.  Developing a transfusion reaction. This is an allergic reaction to something in the blood you were given. Every precaution is taken to prevent this. The decision to have a blood transfusion has been considered carefully by your caregiver before blood is given. Blood is not given unless the benefits outweigh the risks. AFTER THE TRANSFUSION  Right after receiving a blood transfusion, you will usually feel much better and more energetic. This is especially true if your red blood cells have gotten low (anemic). The transfusion raises the level of the red blood cells which carry oxygen, and this usually causes an energy increase.  The nurse administering the transfusion will monitor you carefully for complications. HOME CARE INSTRUCTIONS  No special instructions are needed after a transfusion. You may find your energy is better. Speak with your caregiver about any limitations on activity for underlying diseases you may have. SEEK MEDICAL CARE IF:   Your condition is not  improving after your transfusion.  You develop redness or irritation at the intravenous (IV) site. SEEK IMMEDIATE MEDICAL CARE IF:  Any of the following symptoms occur over the next 12 hours:  Shaking chills.  You have a temperature by mouth above 102 F (38.9 C), not controlled by medicine.  Chest, back, or muscle pain.  People around you feel you are not acting correctly or are confused.  Shortness of breath or difficulty breathing.  Dizziness and fainting.  You get a rash or develop hives.  You have a decrease in urine output.  Your urine turns a dark color or changes to pink, red, or brown. Any of the following symptoms occur over the next 10 days:  You have a temperature by mouth above 102 F (38.9 C), not controlled by medicine.  Shortness of breath.  Weakness after normal activity.  The white part of the eye turns yellow (jaundice).  You have a decrease in the amount of urine or are urinating less often.  Your urine turns a dark color or changes to pink, red, or brown. Document Released: 04/14/2000 Document Revised: 07/10/2011 Document Reviewed: 12/02/2007 [facility] Patient Information 2014 [place], [place].  _______________________________________________________________________

## 2016-01-27 MED ORDER — SODIUM CHLORIDE 0.9 % IV SOLN
INTRAVENOUS | Status: DC
  Filled 2016-01-27: qty 6

## 2016-01-28 ENCOUNTER — Inpatient Hospital Stay (HOSPITAL_COMMUNITY): Payer: Commercial Managed Care - HMO | Admitting: Anesthesiology

## 2016-01-28 ENCOUNTER — Inpatient Hospital Stay (HOSPITAL_COMMUNITY)
Admission: RE | Admit: 2016-01-28 | Discharge: 2016-02-02 | DRG: 331 | Disposition: A | Discharge status: Home / Self Care | Payer: Commercial Managed Care - HMO | Source: Ambulatory Visit | Attending: Surgery | Admitting: Surgery

## 2016-01-28 ENCOUNTER — Encounter (HOSPITAL_COMMUNITY)
Admission: RE | Disposition: A | Discharge status: Home / Self Care | Payer: Self-pay | Source: Ambulatory Visit | Attending: Surgery

## 2016-01-28 ENCOUNTER — Encounter (HOSPITAL_COMMUNITY): Payer: Self-pay | Admitting: *Deleted

## 2016-01-28 DIAGNOSIS — I10 Essential (primary) hypertension: Secondary | ICD-10-CM | POA: Diagnosis not present

## 2016-01-28 DIAGNOSIS — Z86711 Personal history of pulmonary embolism: Secondary | ICD-10-CM | POA: Diagnosis not present

## 2016-01-28 DIAGNOSIS — R32 Unspecified urinary incontinence: Secondary | ICD-10-CM | POA: Diagnosis not present

## 2016-01-28 DIAGNOSIS — K219 Gastro-esophageal reflux disease without esophagitis: Secondary | ICD-10-CM | POA: Diagnosis present

## 2016-01-28 DIAGNOSIS — Z9221 Personal history of antineoplastic chemotherapy: Secondary | ICD-10-CM | POA: Diagnosis not present

## 2016-01-28 DIAGNOSIS — C49A0 Gastrointestinal stromal tumor, unspecified site: Secondary | ICD-10-CM | POA: Diagnosis present

## 2016-01-28 DIAGNOSIS — Z8673 Personal history of transient ischemic attack (TIA), and cerebral infarction without residual deficits: Secondary | ICD-10-CM

## 2016-01-28 DIAGNOSIS — Z9889 Other specified postprocedural states: Secondary | ICD-10-CM

## 2016-01-28 DIAGNOSIS — Z6834 Body mass index (BMI) 34.0-34.9, adult: Secondary | ICD-10-CM

## 2016-01-28 DIAGNOSIS — D509 Iron deficiency anemia, unspecified: Secondary | ICD-10-CM | POA: Diagnosis not present

## 2016-01-28 DIAGNOSIS — Z90710 Acquired absence of both cervix and uterus: Secondary | ICD-10-CM

## 2016-01-28 DIAGNOSIS — M7981 Nontraumatic hematoma of soft tissue: Secondary | ICD-10-CM | POA: Diagnosis present

## 2016-01-28 DIAGNOSIS — N816 Rectocele: Secondary | ICD-10-CM | POA: Diagnosis present

## 2016-01-28 DIAGNOSIS — E876 Hypokalemia: Secondary | ICD-10-CM | POA: Diagnosis present

## 2016-01-28 DIAGNOSIS — D571 Sickle-cell disease without crisis: Secondary | ICD-10-CM | POA: Diagnosis not present

## 2016-01-28 DIAGNOSIS — E785 Hyperlipidemia, unspecified: Secondary | ICD-10-CM | POA: Diagnosis present

## 2016-01-28 DIAGNOSIS — Z7901 Long term (current) use of anticoagulants: Secondary | ICD-10-CM | POA: Diagnosis not present

## 2016-01-28 DIAGNOSIS — Z800 Family history of malignant neoplasm of digestive organs: Secondary | ICD-10-CM | POA: Diagnosis not present

## 2016-01-28 DIAGNOSIS — K649 Unspecified hemorrhoids: Secondary | ICD-10-CM | POA: Diagnosis present

## 2016-01-28 DIAGNOSIS — G4700 Insomnia, unspecified: Secondary | ICD-10-CM | POA: Diagnosis not present

## 2016-01-28 DIAGNOSIS — E8881 Metabolic syndrome: Secondary | ICD-10-CM | POA: Diagnosis not present

## 2016-01-28 DIAGNOSIS — Z932 Ileostomy status: Secondary | ICD-10-CM | POA: Diagnosis not present

## 2016-01-28 DIAGNOSIS — M7989 Other specified soft tissue disorders: Secondary | ICD-10-CM | POA: Diagnosis not present

## 2016-01-28 DIAGNOSIS — T888XXA Other specified complications of surgical and medical care, not elsewhere classified, initial encounter: Secondary | ICD-10-CM | POA: Diagnosis not present

## 2016-01-28 DIAGNOSIS — L7634 Postprocedural seroma of skin and subcutaneous tissue following other procedure: Secondary | ICD-10-CM | POA: Diagnosis not present

## 2016-01-28 DIAGNOSIS — E669 Obesity, unspecified: Secondary | ICD-10-CM | POA: Diagnosis present

## 2016-01-28 DIAGNOSIS — Z8500 Personal history of malignant neoplasm of unspecified digestive organ: Secondary | ICD-10-CM

## 2016-01-28 DIAGNOSIS — R222 Localized swelling, mass and lump, trunk: Secondary | ICD-10-CM | POA: Diagnosis present

## 2016-01-28 DIAGNOSIS — Z432 Encounter for attention to ileostomy: Secondary | ICD-10-CM | POA: Diagnosis not present

## 2016-01-28 DIAGNOSIS — K5909 Other constipation: Secondary | ICD-10-CM | POA: Diagnosis present

## 2016-01-28 DIAGNOSIS — G894 Chronic pain syndrome: Secondary | ICD-10-CM | POA: Diagnosis not present

## 2016-01-28 DIAGNOSIS — C49A4 Gastrointestinal stromal tumor of large intestine: Secondary | ICD-10-CM | POA: Diagnosis present

## 2016-01-28 DIAGNOSIS — K913 Postprocedural intestinal obstruction, unspecified as to partial versus complete: Secondary | ICD-10-CM

## 2016-01-28 DIAGNOSIS — Z79899 Other long term (current) drug therapy: Secondary | ICD-10-CM | POA: Diagnosis not present

## 2016-01-28 DIAGNOSIS — R195 Other fecal abnormalities: Secondary | ICD-10-CM | POA: Diagnosis not present

## 2016-01-28 DIAGNOSIS — Z7982 Long term (current) use of aspirin: Secondary | ICD-10-CM | POA: Diagnosis not present

## 2016-01-28 DIAGNOSIS — Z8672 Personal history of thrombophlebitis: Secondary | ICD-10-CM

## 2016-01-28 DIAGNOSIS — Z96649 Presence of unspecified artificial hip joint: Secondary | ICD-10-CM | POA: Diagnosis present

## 2016-01-28 HISTORY — DX: Perforation of intestine (nontraumatic): K63.1

## 2016-01-28 HISTORY — PX: ILEOSTOMY CLOSURE: SHX1784

## 2016-01-28 HISTORY — PX: EXCISION MASS ABDOMINAL: SHX6701

## 2016-01-28 HISTORY — DX: Acute kidney failure, unspecified: N17.9

## 2016-01-28 HISTORY — PX: PROCTOSCOPY: SHX2266

## 2016-01-28 LAB — TYPE AND SCREEN
ABO/RH(D): O POS
Antibody Screen: NEGATIVE

## 2016-01-28 SURGERY — CLOSURE, ILEOSTOMY
Anesthesia: General | Site: Abdomen

## 2016-01-28 MED ORDER — MENTHOL 3 MG MT LOZG: 1.0000 | LOZENGE | OROMUCOSAL | Status: DC | PRN

## 2016-01-28 MED ORDER — ALVIMOPAN 12 MG PO CAPS
12.0000 mg | ORAL_CAPSULE | Freq: Once | ORAL | Status: AC
  Administered 2016-01-28: 12 mg via ORAL
  Filled 2016-01-28: qty 1

## 2016-01-28 MED ORDER — SODIUM CHLORIDE 0.9 % IV SOLN
INTRAVENOUS | Status: DC | PRN
  Administered 2016-01-28: 1000 mL

## 2016-01-28 MED ORDER — FERROUS SULFATE 325 (65 FE) MG PO TABS
325.0000 mg | ORAL_TABLET | Freq: Every day | ORAL | Status: DC
  Administered 2016-01-29 – 2016-01-30 (×2): 325 mg via ORAL
  Filled 2016-01-28 (×2): qty 1

## 2016-01-28 MED ORDER — ONDANSETRON HCL 4 MG/2ML IJ SOLN
INTRAMUSCULAR | Status: AC
  Filled 2016-01-28: qty 2

## 2016-01-28 MED ORDER — METOPROLOL TARTRATE 5 MG/5ML IV SOLN: 5.0000 mg | Freq: Four times a day (QID) | INTRAVENOUS | Status: DC | PRN

## 2016-01-28 MED ORDER — DIPHENHYDRAMINE HCL 50 MG/ML IJ SOLN: 12.5000 mg | Freq: Four times a day (QID) | INTRAMUSCULAR | Status: DC | PRN

## 2016-01-28 MED ORDER — DEXTROSE 5 % IV SOLN
2.0000 g | Freq: Two times a day (BID) | INTRAVENOUS | Status: AC
  Administered 2016-01-28: 2 g via INTRAVENOUS
  Filled 2016-01-28: qty 2

## 2016-01-28 MED ORDER — DEXAMETHASONE SODIUM PHOSPHATE 10 MG/ML IJ SOLN
INTRAMUSCULAR | Status: DC | PRN
  Administered 2016-01-28: 10 mg via INTRAVENOUS

## 2016-01-28 MED ORDER — OXYMETAZOLINE HCL 0.05 % NA SOLN
1.0000 | Freq: Two times a day (BID) | NASAL | Status: DC | PRN
  Filled 2016-01-28: qty 15

## 2016-01-28 MED ORDER — BENAZEPRIL HCL 40 MG PO TABS
40.0000 mg | ORAL_TABLET | Freq: Every evening | ORAL | Status: DC
  Administered 2016-01-28 – 2016-02-01 (×5): 40 mg via ORAL
  Filled 2016-01-28 (×6): qty 1

## 2016-01-28 MED ORDER — VITAMIN C 500 MG PO TABS
500.0000 mg | ORAL_TABLET | Freq: Two times a day (BID) | ORAL | Status: DC
  Administered 2016-01-28 – 2016-02-02 (×10): 500 mg via ORAL
  Filled 2016-01-28 (×10): qty 1

## 2016-01-28 MED ORDER — SUCCINYLCHOLINE CHLORIDE 200 MG/10ML IV SOSY
PREFILLED_SYRINGE | INTRAVENOUS | Status: DC | PRN
  Administered 2016-01-28: 100 mg via INTRAVENOUS

## 2016-01-28 MED ORDER — LACTATED RINGERS IV SOLN
INTRAVENOUS | Status: DC
  Administered 2016-01-28 (×3): via INTRAVENOUS

## 2016-01-28 MED ORDER — PROPOFOL 10 MG/ML IV BOLUS
INTRAVENOUS | Status: AC
  Filled 2016-01-28: qty 20

## 2016-01-28 MED ORDER — BUPIVACAINE-EPINEPHRINE 0.5% -1:200000 IJ SOLN
INTRAMUSCULAR | Status: AC
  Filled 2016-01-28: qty 1

## 2016-01-28 MED ORDER — HYDROMORPHONE HCL 1 MG/ML IJ SOLN
INTRAMUSCULAR | Status: AC
  Filled 2016-01-28: qty 1

## 2016-01-28 MED ORDER — LACTATED RINGERS IV BOLUS (SEPSIS): 1000.0000 mL | Freq: Three times a day (TID) | INTRAVENOUS | Status: AC | PRN

## 2016-01-28 MED ORDER — MAGIC MOUTHWASH
15.0000 mL | Freq: Four times a day (QID) | ORAL | Status: DC | PRN
  Filled 2016-01-28: qty 15

## 2016-01-28 MED ORDER — ACETAMINOPHEN 500 MG PO TABS: 1000.0000 mg | ORAL_TABLET | Freq: Four times a day (QID) | ORAL | Status: DC | PRN

## 2016-01-28 MED ORDER — FENTANYL CITRATE (PF) 250 MCG/5ML IJ SOLN
INTRAMUSCULAR | Status: AC
  Filled 2016-01-28: qty 5

## 2016-01-28 MED ORDER — GABAPENTIN 300 MG PO CAPS
300.0000 mg | ORAL_CAPSULE | ORAL | Status: AC
  Administered 2016-01-28: 300 mg via ORAL
  Filled 2016-01-28: qty 1

## 2016-01-28 MED ORDER — MEPERIDINE HCL 50 MG/ML IJ SOLN: 6.2500 mg | INTRAMUSCULAR | Status: DC | PRN

## 2016-01-28 MED ORDER — DIPHENHYDRAMINE HCL 12.5 MG/5ML PO ELIX: 12.5000 mg | ORAL_SOLUTION | Freq: Four times a day (QID) | ORAL | Status: DC | PRN

## 2016-01-28 MED ORDER — ONDANSETRON HCL 4 MG/2ML IJ SOLN
4.0000 mg | Freq: Once | INTRAMUSCULAR | Status: DC | PRN
Start: 2016-01-28 — End: 2016-01-28

## 2016-01-28 MED ORDER — ENOXAPARIN SODIUM 40 MG/0.4ML ~~LOC~~ SOLN
40.0000 mg | SUBCUTANEOUS | Status: AC
Start: 2016-01-29 — End: 2016-01-29
  Administered 2016-01-29: 40 mg via SUBCUTANEOUS
  Filled 2016-01-28: qty 0.4

## 2016-01-28 MED ORDER — PANTOPRAZOLE SODIUM 40 MG PO TBEC
40.0000 mg | DELAYED_RELEASE_TABLET | Freq: Every day | ORAL | Status: DC
  Administered 2016-01-28 – 2016-02-02 (×6): 40 mg via ORAL
  Filled 2016-01-28 (×6): qty 1

## 2016-01-28 MED ORDER — ROCURONIUM BROMIDE 10 MG/ML (PF) SYRINGE
PREFILLED_SYRINGE | INTRAVENOUS | Status: DC | PRN
  Administered 2016-01-28: 10 mg via INTRAVENOUS
  Administered 2016-01-28: 40 mg via INTRAVENOUS

## 2016-01-28 MED ORDER — ASPIRIN EC 81 MG PO TBEC
81.0000 mg | DELAYED_RELEASE_TABLET | Freq: Every day | ORAL | Status: DC
  Administered 2016-01-28 – 2016-02-01 (×5): 81 mg via ORAL
  Filled 2016-01-28 (×5): qty 1

## 2016-01-28 MED ORDER — CEFAZOLIN SODIUM-DEXTROSE 2-4 GM/100ML-% IV SOLN
INTRAVENOUS | Status: AC
  Filled 2016-01-28: qty 100

## 2016-01-28 MED ORDER — FENTANYL CITRATE (PF) 250 MCG/5ML IJ SOLN
INTRAMUSCULAR | Status: DC | PRN
  Administered 2016-01-28 (×5): 50 ug via INTRAVENOUS

## 2016-01-28 MED ORDER — ROCURONIUM BROMIDE 10 MG/ML (PF) SYRINGE
PREFILLED_SYRINGE | INTRAVENOUS | Status: AC
  Filled 2016-01-28: qty 10

## 2016-01-28 MED ORDER — HYDROMORPHONE HCL 1 MG/ML IJ SOLN
0.2500 mg | INTRAMUSCULAR | Status: DC | PRN
  Administered 2016-01-28: 0.5 mg via INTRAVENOUS

## 2016-01-28 MED ORDER — PRAVASTATIN SODIUM 20 MG PO TABS
20.0000 mg | ORAL_TABLET | Freq: Every day | ORAL | Status: DC
  Administered 2016-01-28 – 2016-02-01 (×5): 20 mg via ORAL
  Filled 2016-01-28 (×5): qty 1

## 2016-01-28 MED ORDER — OXYCODONE HCL 5 MG/5ML PO SOLN
5.0000 mg | Freq: Once | ORAL | Status: DC | PRN
  Filled 2016-01-28: qty 5

## 2016-01-28 MED ORDER — OXYCODONE HCL 5 MG PO TABS: 5.0000 mg | ORAL_TABLET | Freq: Once | ORAL | Status: DC | PRN

## 2016-01-28 MED ORDER — ENOXAPARIN SODIUM 40 MG/0.4ML ~~LOC~~ SOLN
40.0000 mg | Freq: Once | SUBCUTANEOUS | Status: AC
  Administered 2016-01-28: 40 mg via SUBCUTANEOUS
  Filled 2016-01-28: qty 0.4

## 2016-01-28 MED ORDER — LIDOCAINE 2% (20 MG/ML) 5 ML SYRINGE
INTRAMUSCULAR | Status: AC
  Filled 2016-01-28: qty 5

## 2016-01-28 MED ORDER — CEFOTETAN DISODIUM-DEXTROSE 2-2.08 GM-% IV SOLR
INTRAVENOUS | Status: AC
  Filled 2016-01-28: qty 50

## 2016-01-28 MED ORDER — LACTATED RINGERS IV SOLN
INTRAVENOUS | Status: DC
  Administered 2016-01-28 – 2016-01-30 (×3): via INTRAVENOUS

## 2016-01-28 MED ORDER — ALVIMOPAN 12 MG PO CAPS
12.0000 mg | ORAL_CAPSULE | Freq: Two times a day (BID) | ORAL | Status: DC
  Administered 2016-01-29: 12 mg via ORAL
  Filled 2016-01-28 (×2): qty 1

## 2016-01-28 MED ORDER — ENOXAPARIN SODIUM 40 MG/0.4ML ~~LOC~~ SOLN: 40.0000 mg | SUBCUTANEOUS | Status: DC

## 2016-01-28 MED ORDER — SACCHAROMYCES BOULARDII 250 MG PO CAPS
250.0000 mg | ORAL_CAPSULE | Freq: Two times a day (BID) | ORAL | Status: DC
  Administered 2016-01-28 – 2016-01-31 (×7): 250 mg via ORAL
  Filled 2016-01-28 (×7): qty 1

## 2016-01-28 MED ORDER — SUGAMMADEX SODIUM 200 MG/2ML IV SOLN
INTRAVENOUS | Status: AC
  Filled 2016-01-28: qty 2

## 2016-01-28 MED ORDER — PROCHLORPERAZINE EDISYLATE 5 MG/ML IJ SOLN
10.0000 mg | Freq: Four times a day (QID) | INTRAMUSCULAR | Status: DC | PRN
  Administered 2016-02-02: 10 mg via INTRAVENOUS
  Filled 2016-01-28: qty 2

## 2016-01-28 MED ORDER — PROPOFOL 10 MG/ML IV BOLUS
INTRAVENOUS | Status: DC | PRN
  Administered 2016-01-28: 150 mg via INTRAVENOUS

## 2016-01-28 MED ORDER — ZOLPIDEM TARTRATE 5 MG PO TABS
5.0000 mg | ORAL_TABLET | Freq: Every evening | ORAL | Status: DC | PRN
Start: 2016-01-28 — End: 2016-02-02
  Administered 2016-01-30 – 2016-02-01 (×3): 5 mg via ORAL
  Filled 2016-01-28 (×4): qty 1

## 2016-01-28 MED ORDER — ALUM & MAG HYDROXIDE-SIMETH 200-200-20 MG/5ML PO SUSP
30.0000 mL | Freq: Four times a day (QID) | ORAL | Status: DC | PRN
  Administered 2016-01-31: 30 mL via ORAL
  Filled 2016-01-28: qty 30

## 2016-01-28 MED ORDER — ACETAMINOPHEN 500 MG PO TABS
1000.0000 mg | ORAL_TABLET | Freq: Three times a day (TID) | ORAL | Status: DC
  Administered 2016-01-28 – 2016-02-02 (×14): 1000 mg via ORAL
  Filled 2016-01-28 (×14): qty 2

## 2016-01-28 MED ORDER — METOPROLOL TARTRATE 12.5 MG HALF TABLET: 12.5000 mg | ORAL_TABLET | Freq: Two times a day (BID) | ORAL | Status: DC | PRN

## 2016-01-28 MED ORDER — HYDROMORPHONE HCL 1 MG/ML IJ SOLN
0.5000 mg | INTRAMUSCULAR | Status: DC | PRN
  Administered 2016-01-28 (×2): 0.5 mg via INTRAVENOUS
  Administered 2016-01-29: 1 mg via INTRAVENOUS
  Administered 2016-01-29: 0.5 mg via INTRAVENOUS
  Administered 2016-01-30: 1 mg via INTRAVENOUS
  Administered 2016-01-30: 0.5 mg via INTRAVENOUS
  Filled 2016-01-28 (×6): qty 1

## 2016-01-28 MED ORDER — DEXTROSE 5 % IV SOLN
2.0000 g | INTRAVENOUS | Status: AC
  Administered 2016-01-28: 2 g via INTRAVENOUS

## 2016-01-28 MED ORDER — 0.9 % SODIUM CHLORIDE (POUR BTL) OPTIME
TOPICAL | Status: DC | PRN
  Administered 2016-01-28: 2000 mL

## 2016-01-28 MED ORDER — LIP MEDEX EX OINT
1.0000 "application " | TOPICAL_OINTMENT | Freq: Two times a day (BID) | CUTANEOUS | Status: DC
  Administered 2016-01-28 – 2016-02-02 (×10): 1 via TOPICAL
  Filled 2016-01-28 (×3): qty 7

## 2016-01-28 MED ORDER — BUPIVACAINE LIPOSOME 1.3 % IJ SUSP
20.0000 mL | INTRAMUSCULAR | Status: DC
  Filled 2016-01-28: qty 20

## 2016-01-28 MED ORDER — SUGAMMADEX SODIUM 200 MG/2ML IV SOLN
INTRAVENOUS | Status: DC | PRN
  Administered 2016-01-28: 300 mg via INTRAVENOUS

## 2016-01-28 MED ORDER — LORAZEPAM 0.5 MG PO TABS: 0.5000 mg | ORAL_TABLET | Freq: Every evening | ORAL | Status: DC | PRN

## 2016-01-28 MED ORDER — TRAMADOL HCL 50 MG PO TABS: 50.0000 mg | ORAL_TABLET | Freq: Four times a day (QID) | ORAL | 1 refills | Status: DC | PRN

## 2016-01-28 MED ORDER — DEXAMETHASONE SODIUM PHOSPHATE 10 MG/ML IJ SOLN
INTRAMUSCULAR | Status: AC
  Filled 2016-01-28: qty 1

## 2016-01-28 MED ORDER — ENALAPRILAT 1.25 MG/ML IV SOLN
0.6250 mg | Freq: Four times a day (QID) | INTRAVENOUS | Status: DC | PRN
  Filled 2016-01-28: qty 1

## 2016-01-28 MED ORDER — DEXMEDETOMIDINE HCL IN NACL 200 MCG/50ML IV SOLN
INTRAVENOUS | Status: DC | PRN
  Administered 2016-01-28 (×5): 12 ug via INTRAVENOUS

## 2016-01-28 MED ORDER — ONDANSETRON HCL 4 MG/2ML IJ SOLN
INTRAMUSCULAR | Status: DC | PRN
  Administered 2016-01-28: 4 mg via INTRAVENOUS

## 2016-01-28 MED ORDER — IBUPROFEN 400 MG PO TABS
400.0000 mg | ORAL_TABLET | Freq: Four times a day (QID) | ORAL | Status: DC | PRN
  Administered 2016-01-29: 800 mg via ORAL
  Filled 2016-01-28 (×2): qty 2

## 2016-01-28 MED ORDER — ACETAMINOPHEN 500 MG PO TABS
1000.0000 mg | ORAL_TABLET | ORAL | Status: AC
  Administered 2016-01-28: 1000 mg via ORAL
  Filled 2016-01-28: qty 2

## 2016-01-28 MED ORDER — LIP MEDEX EX OINT
TOPICAL_OINTMENT | CUTANEOUS | Status: AC
  Filled 2016-01-28: qty 7

## 2016-01-28 MED ORDER — APIXABAN 5 MG PO TABS
5.0000 mg | ORAL_TABLET | Freq: Two times a day (BID) | ORAL | Status: DC
  Administered 2016-01-30 – 2016-02-02 (×7): 5 mg via ORAL
  Filled 2016-01-28 (×7): qty 1

## 2016-01-28 MED ORDER — PHENOL 1.4 % MT LIQD
2.0000 | OROMUCOSAL | Status: DC | PRN
  Filled 2016-01-28: qty 177

## 2016-01-28 MED ORDER — SODIUM CHLORIDE 0.9 % IJ SOLN
INTRAMUSCULAR | Status: AC
  Filled 2016-01-28: qty 100

## 2016-01-28 MED ORDER — METHOCARBAMOL 1000 MG/10ML IJ SOLN
1000.0000 mg | Freq: Four times a day (QID) | INTRAVENOUS | Status: DC | PRN
  Filled 2016-01-28: qty 10

## 2016-01-28 MED ORDER — LIDOCAINE 2% (20 MG/ML) 5 ML SYRINGE
INTRAMUSCULAR | Status: DC | PRN
  Administered 2016-01-28: 80 mg via INTRAVENOUS

## 2016-01-28 MED ORDER — PROPYLHEXEDRINE NA INHA: 1.0000 | Freq: Every day | NASAL | Status: DC | PRN

## 2016-01-28 SURGICAL SUPPLY — 53 items
BLADE SURG SZ10 CARB STEEL (BLADE) ×3 IMPLANT
CHLORAPREP W/TINT 26ML (MISCELLANEOUS) ×5 IMPLANT
COVER MAYO STAND STRL (DRAPES) ×3 IMPLANT
COVER SURGICAL LIGHT HANDLE (MISCELLANEOUS) ×3 IMPLANT
DECANTER SPIKE VIAL GLASS SM (MISCELLANEOUS) ×3 IMPLANT
DRAIN CHANNEL 19F RND (DRAIN) ×4 IMPLANT
DRAPE LAPAROSCOPIC ABDOMINAL (DRAPES) ×3 IMPLANT
DRAPE SHEET LG 3/4 BI-LAMINATE (DRAPES) IMPLANT
DRAPE UTILITY XL STRL (DRAPES) ×5 IMPLANT
DRAPE WARM FLUID 44X44 (DRAPE) ×3 IMPLANT
DRSG OPSITE POSTOP 4X10 (GAUZE/BANDAGES/DRESSINGS) IMPLANT
DRSG OPSITE POSTOP 4X6 (GAUZE/BANDAGES/DRESSINGS) IMPLANT
DRSG OPSITE POSTOP 4X8 (GAUZE/BANDAGES/DRESSINGS) IMPLANT
DRSG TEGADERM 2-3/8X2-3/4 SM (GAUZE/BANDAGES/DRESSINGS) ×2 IMPLANT
DRSG TEGADERM 4X4.75 (GAUZE/BANDAGES/DRESSINGS) ×7 IMPLANT
ELECT PENCIL ROCKER SW 15FT (MISCELLANEOUS) ×3 IMPLANT
ELECT REM PT RETURN 9FT ADLT (ELECTROSURGICAL) ×3
ELECTRODE REM PT RTRN 9FT ADLT (ELECTROSURGICAL) ×1 IMPLANT
EVACUATOR SILICONE 100CC (DRAIN) ×4 IMPLANT
GAUZE SPONGE 2X2 8PLY STRL LF (GAUZE/BANDAGES/DRESSINGS) IMPLANT
GAUZE SPONGE 4X4 12PLY STRL (GAUZE/BANDAGES/DRESSINGS) ×3 IMPLANT
GLOVE ECLIPSE 8.0 STRL XLNG CF (GLOVE) ×3 IMPLANT
GLOVE INDICATOR 8.0 STRL GRN (GLOVE) ×3 IMPLANT
GOWN STRL REUS W/TWL XL LVL3 (GOWN DISPOSABLE) ×9 IMPLANT
HANDLE SUCTION POOLE (INSTRUMENTS) ×1 IMPLANT
KIT BASIN OR (CUSTOM PROCEDURE TRAY) ×3 IMPLANT
LEGGING LITHOTOMY PAIR STRL (DRAPES) ×3 IMPLANT
LIGASURE IMPACT 36 18CM CVD LR (INSTRUMENTS) ×2 IMPLANT
NEEDLE HYPO 22GX1.5 SAFETY (NEEDLE) ×2 IMPLANT
PACK GENERAL/GYN (CUSTOM PROCEDURE TRAY) ×3 IMPLANT
SPONGE GAUZE 2X2 STER 10/PKG (GAUZE/BANDAGES/DRESSINGS) ×2
SPONGE LAP 18X18 X RAY DECT (DISPOSABLE) ×12 IMPLANT
STAPLER GUN LINEAR PROX 60 (STAPLE) ×2 IMPLANT
STAPLER PROXIMATE 75MM BLUE (STAPLE) ×2 IMPLANT
STAPLER VISISTAT 35W (STAPLE) ×3 IMPLANT
SUCTION POOLE HANDLE (INSTRUMENTS) ×3
SUT MNCRL AB 4-0 PS2 18 (SUTURE) ×13 IMPLANT
SUT PDS AB 1 CTX 36 (SUTURE) ×6 IMPLANT
SUT PDS AB 1 TP1 96 (SUTURE) IMPLANT
SUT PROLENE 2 0 SH DA (SUTURE) ×4 IMPLANT
SUT SILK 2 0 (SUTURE) ×3
SUT SILK 2 0 SH CR/8 (SUTURE) ×5 IMPLANT
SUT SILK 2-0 18XBRD TIE 12 (SUTURE) ×1 IMPLANT
SUT SILK 3 0 (SUTURE) ×3
SUT SILK 3 0 SH CR/8 (SUTURE) ×3 IMPLANT
SUT SILK 3-0 18XBRD TIE 12 (SUTURE) ×1 IMPLANT
SUT VIC AB 2-0 SH 18 (SUTURE) ×18 IMPLANT
SYR 20CC LL (SYRINGE) ×2 IMPLANT
SYR BULB IRRIGATION 50ML (SYRINGE) ×3 IMPLANT
TAPE UMBILICAL COTTON 1/8X30 (MISCELLANEOUS) ×3 IMPLANT
TOWEL OR 17X26 10 PK STRL BLUE (TOWEL DISPOSABLE) ×6 IMPLANT
TOWEL OR NON WOVEN STRL DISP B (DISPOSABLE) ×6 IMPLANT
YANKAUER SUCT BULB TIP 10FT TU (MISCELLANEOUS) ×3 IMPLANT

## 2016-01-28 NOTE — Interval H&P Note (Signed)
History and Physical Interval Note:  01/28/2016 10:46 AM  Brianna Walker  has presented today for surgery, with the diagnosis of pelvic gastrointestinal stromal tumor status post excision with fecal diversion.  Chronic abdominal wall seroma in panniculus  The various methods of treatment have been discussed with the patient and family. After consideration of risks, benefits and other options for treatment, the patient has consented to  Procedure(s): TAKEDOWN OF LOOP ILEOSTOMY (N/A) RIGID PROCTOSCOPY (N/A) EXCISION ABDOMINAL WALL SEROMA (N/A) as a surgical intervention .  The patient's history has been reviewed, patient examined, no change in status, stable for surgery.  I have reviewed the patient's chart and labs.  Questions were answered to the patient's satisfaction.     Elaina Cara C.

## 2016-01-28 NOTE — Anesthesia Postprocedure Evaluation (Signed)
Anesthesia Post Note  Patient: Brianna Walker  Procedure(s) Performed: Procedure(s) (LRB): TAKEDOWN OF LOOP ILEOSTOMY (N/A) RIGID PROCTOSCOPY, DILATION OF COLORECTAL ANASTOMOTIC STRICTURE (N/A) EXCISION  ABDOMINAL WALL MASS, CYST, QUESTION FAT NECROSIS, QUESTION SEROMA (N/A)  Patient location during evaluation: PACU Anesthesia Type: General Level of consciousness: awake and alert Pain management: pain level controlled Vital Signs Assessment: post-procedure vital signs reviewed and stable Respiratory status: spontaneous breathing, nonlabored ventilation, respiratory function stable and patient connected to nasal cannula oxygen Cardiovascular status: blood pressure returned to baseline and stable Postop Assessment: no signs of nausea or vomiting Anesthetic complications: no    Last Vitals:  Vitals:   01/28/16 1500 01/28/16 1515  BP: (!) 103/59 107/63  Pulse: (!) 49 (!) 49  Resp: 13 13  Temp:      Last Pain:  Vitals:   01/28/16 1500  TempSrc:   PainSc: 0-No pain                 Roxy Filler A

## 2016-01-28 NOTE — H&P (Signed)
Brianna Walker  Location: [facility] Patient #: [id number] DOB: [birth year] Married / Language: English / Race: Black or African American Female  Patient Care Team: Biagio Borg, MD as PCP - General Michael Boston, MD as Consulting Physician (General Surgery) Ladell Pier, MD as Consulting Physician (Oncology) Milus Banister, MD as Consulting Physician (Gastroenterology) Billy Fischer, MD as Consulting Physician (Family Medicine) Tania Ade, RN as Registered Nurse   History of Present Illness  The patient is a 70 year old female who presents with a colorectal polyp. Note for "Colorectal polyp": Patient the patient returns status post robotically-assisted very low anterior resection including distal pelvic floor tumor consistent with gastrointestinal stromal tumor. Pretty well-circumscribed. 06/04/2015   Patient returns feeling better. She had an Antegrade eEnema through the distal limb of the loop ileostomy. She had sharp pain. CT scan concerning for cecal perforation. No evidence of any pelvic anastomotic leak at the coloanal anastomosis. Admitted. Placed on IV antibiotics. Improved. Discharged. Had recurrent abdominal pain and cramping. Started back on antibiotics. Feeling better now.  She completed her pelvic floor physical therapy and felt to have improvement. No major contraindications to ileostomy takedown. However, the patient does note she gets intermittent drainage. Was monitored with some blood but resolved since she's been on the antibiotics. She is about to finish a two-week course. She is emptying her ileostomy fine. No fevers or chills. No nausea or vomiting. She still hoping to get her loop ileostomy taken down. She still hoping that the enlarging lump in her abdominal panniculus can be removed as well.       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  06/04/2015 Active Problems: Hyperlipidemia GERD INSOMNIA-SLEEP DISORDER-UNSPEC PULMONARY EMBOLISM, HX OF Iron deficiency anemia Obesity Metabolic syndrome Chronic pain syndrome   POST-OPERATIVE DIAGNOSIS:   GIST TUMOR OF RECTUM IN PELVIS  SURGERY: 06/04/2015  Procedure(s): XI ROBOTIC ASSISTED LYSIS OF ADHESIONS, LOWER ANTERIOR RESECTION TRANS ABDOMINAL AND TRANS ANAL, COLOANAL HANDSEWN ANASTAOSIS, DIVERTING LOPE ILIOSTOMY, RESECTION GIST TUMOR LAPAROSCOPIC LYSIS OF ADHESIONS  SURGEON:   Surgeon(s): Michael Boston, MD Leighton Ruff, MD  Consults: WOCN, PT, OT  Med Onc - Dr Benay Spice  INDICATION:   Patient with large pelvic mass in low pelvis. Biopsy consistent with gastrointestinal stromal tumor. Probable rectal origin. Did Gleevec for 9 months to help shrink the mass down. However having issues of pressure and difficulty with bowel movements. I recommended segmental resection   OR FINDINGS:  Patient had rubbery hard bulky tumor in the lower pelvis, coming off the distal left anterior rectal wall pushing all the organs away.  No obvious metastatic disease on visceral parietal peritoneum or liver. Very dense intra-abdominal adhesions. 3 x 2 cm cystic mass in the anterior abdominal wall with mid ileum adherent to it. Uncertain of this was a diverticulum fistula or separate cyst. Excised with serosal repair done.  Part of the sigmoid colon entire rectum are removed. It is a handsewn side colon onto end anal canal anastomosis. The anastomosis rests 1-2 cm from the anal verge     Diagnosis 1. Soft tissue, biopsy, abdominal wall cyst ? fistula BENIGN CYST AND REACTIVE CHANGES NEGATIVE FOR MALIGNANCY 2. Soft tissue mass, simple excision, Pelvic GIST GASTROINTESTINAL STROMAL TUMORS OF THE RECTUM SHOWING TREATMENT EFFECT (9.5 CM) MARGIN OF RESECTION IS POSITIVE 3. Colon, segmental resection for tumor, rectum DIVERTICULA NO RESIDUAL GASTROINTESTINAL  STROMAL TUMOR IDENTIFIED MARGINS OF RESECTION ARE NEGATIVE FIFTEEN BENIGN  LYMPH NODES (0/15) Microscopic Comment 2. GASTROINTESTINAL STROMAL TUMOR (GIST): Procedure: segmental resection Tumor Site: rectum Specify: pelvis rectum Tumor Size: 9.5 cm Tumor Focality Unifocal: focal GIST Subtype (spindle, epithelioid, mixed, etc.): spindle Mitotic Rate: 1 /50 HIGH POWER FIELD Necrosis: negative Histologic Grade 1 G1: Low grade; mitotic rate 5/50 HIGH POWER FIELD. G2: High grade; mitotic rate >5/50 HIGH POWER FIELD. Risk Assessment: _ Margins: positive Ancillary testing: Per request 1 of 3 FINAL for Brianna, Walker [phone]) Microscopic Comment(continued) Lymph nodes: number examined: 15; number positive: 0 Pathologic Staging: pT3 pN0 Comments: The tumor shows treatment effect with hypocellular, atrophic spindle cells and hyalinized, fibrotic stroma. less than 1% of the tumor without treatment effect. Brianna Lanius MD Pathologist, Electronic Signature (Case signed 06/10/2015) Specimen Brianna Walker and Clinical Information Specimen(s) Obtained: 1. Soft tissue, biopsy, abdominal wall cyst ? fistula 2. Soft tissue mass, simple excision, Pelvic GIST 3. Colon, segmental resection for tumor, rectum Specimen Clinical Information 1. GIST tumor of rectum in pelvis (kp) Brianna Walker 1. In formalin is a a 3.1 x 1.4 x 0.3 cm irregular portion of fatty tissue, which on sectioning contains a 0.5 cm collapsed smooth cystic space. Sectioned and entirely submitted in one block. 2. Received in formalin is a 176 gram, 9.5 x 6.8 x 5.8 cm rubbery to firm mass, which has an attached suture over a 2.1 x 1.5 cm roughened area, clinically identifying anterior distal. This area is inked orange. There is also a 5.0 x 4.2 cm disrupted area, which is clinically identified as disrupted margin. This area is inked black. Also along one aspect is a 3.4 x 3.3 cm area of tan to hyperemic possible smooth mucosa. The cut  surfaces of the mass are tan to tan yellow, with vaguely whorled cut surfaces, and scattered myxoid areas. Representative sections are submitted as follows: A, B = sections from clinically disrupted margin C = sections from area identified by suture as anterior distal D, E = central sections Total: 5 blocks submitted 3. Received in formalin is a 27 cm in length segment of colon, clinically recto-sigmoid, which includes at least probable middle third of rectum. Surrounding mesorectum is incomplete and focally disrupted. There is an attached suture over the anterior perirectal surface. The mucosa is tan pink, smooth, soft with normal intestinal folds. There are no mucosal lesions identified. Within the sigmoid portion are several unperforated diverticula. Found within the surrounding fat are 15 possible lymph nodes ranging from 0.1 to 0.6 cm. Block Summary: A = proximal margin B, C = distal margin D, E = sections taken at attached suture over anterior mesorectum F = diverticulum G = four nodes, whole H = four nodes, whole I = four nodes, whole J = three nodes, whole Total: 10 blocks submitted (SW:ds 06/07/15) 2 of 3 FINAL for Brianna Walker, Brianna Walker [id number]) Report signed out from the following location(s) Technical component and interpretation was performed at [facility], [place], [place] [address]. CLIA #: [id number], 3 of 3   Problem List/Past Medical Adin [name], MD; 12/14/2015 8:52 AM) GASTROINTESTINAL STROMAL TUMOR (GIST) OF LARGE INTESTINE (C49.A4) CHRONIC CONSTIPATION (K59.09) ABDOMINAL WALL SEROMA, SEQUELA (T88.8XXS) ILEOSTOMY DYSFUNCTION (K94.13) DECREASED ANAL SPHINCTER TONE (K62.89) RECTAL DISCHARGE (R19.8)  Other Problems Adin [name], MD; 12/14/2015 8:52 AM) Back Pain Cerebrovascular Accident Gastroesophageal Reflux Disease Hemorrhoids High blood pressure Sickle cell disease  Past Surgical History Adin [name],  MD; 12/14/2015 8:52 AM) Colon Polyp Removal - Colonoscopy Gallbladder Surgery - Laparoscopic Hip Surgery  Right. Hysterectomy (not due to cancer) - Partial Shoulder Surgery Right.  Diagnostic Studies History Adin [name], MD; 12/14/2015 8:52 AM) Colonoscopy within last year Mammogram within last year Pap Smear >5 years ago  Allergies Elbert Ewings, CMA; 12/14/2015 8:42 AM) No Known Drug Allergies04/13/2016  Medication History Elbert Ewings, CMA; 12/14/2015 8:43 AM) Augmentin (875-125MG  Tablet, 1 (one) Tablet Oral two times daily, Taken starting 12/02/2015) Active. TraMADol HCl (50MG  Tablet, Oral) Active. Benazepril HCl (40MG  Tablet, Oral) Active. Eliquis (5MG  Tablet, Oral) Active. Estradiol (0.025MG /24HR Patch TW, Transdermal) Active. MoviPrep (100GM For Solution, Oral) Active. Omeprazole (20MG  Capsule DR, Oral) Active. Pravastatin Sodium (20MG  Tablet, Oral) Active. Medications Reconciled  Social History Adin [name], MD; 12/14/2015 8:52 AM) Alcohol use Occasional alcohol use. Caffeine use Carbonated beverages, Coffee, Tea. No drug use Tobacco use Former smoker.  Family History Adin [name], MD; 12/14/2015 8:52 AM) Colon Cancer Father. Colon Polyps Father. Diabetes Mellitus Sister. Heart Disease Mother, Sister. Heart disease in female family member before age 19 Malignant Neoplasm Of Pancreas Father.  Pregnancy / Birth History Adin [name], MD; 12/14/2015 8:52 AM) Age at menarche 35 years. Age of menopause <47 Gravida 2 Irregular periods Maternal age 75-20 Para 2  Vitals Elbert Ewings CMA; 12/14/2015 8:43 AM) 12/14/2015 8:43 AM Weight: 195 lb Height: 63in Body Surface Area: 1.91 m Body Mass Index: 34.54 kg/m  Temp.: 97.69F(Temporal)  Pulse: 104 (Regular)  BP: 124/76 (Sitting, Left Arm, Standard)       Physical Exam Adin [name] MD; 12/14/2015 9:06 AM) General Mental Status-Alert. General  Appearance-Not in acute distress. Voice-Normal.  Integumentary Global Assessment Normal Exam - Distribution of scalp and body hair is normal. General Characteristics Overall Skin Surface - no rashes and no suspicious lesions.  Head and Neck Head-normocephalic, atraumatic with no lesions or palpable masses. Face Global Assessment - atraumatic, no absence of expression. Neck Global Assessment - no abnormal movements, no decreased range of motion. Trachea-midline. Thyroid Gland Characteristics - non-tender.  Eye Eyeball - Left-Extraocular movements intact, No Nystagmus. Eyeball - Right-Extraocular movements intact, No Nystagmus. Upper Eyelid - Left-No Cyanotic. Upper Eyelid - Right-No Cyanotic.  Chest and Lung Exam Inspection Accessory muscles - No use of accessory muscles in breathing.  Abdomen Note: Morbidly obese but soft. Ileostomy pouch intact. Pink. Thick succus effluent in bag. No leak. Infraumbilical midline mass in panniculus consistent with chronic seroma from prior hernia repair.   Female Genitourinary Note: No vaginal bleeding or discharge. Posterior vaginal septum intact. Mild to still rectocele but not severe   Rectal Note: Perianal skin clear. HYpopigmented flat scar LEFT posterior. Decreased sphincter tone overall.   Mild stricturing felt 4 cm proximal to the anal verge, but allows index finger to pass. Rectovaginal septum intact no major rectocele.   Peripheral Vascular Upper Extremity Inspection - Left - Not Gangrenous, No Petechiae. Right - Not Gangrenous, No Petechiae.  Neurologic Neurologic evaluation reveals -normal attention span and ability to concentrate, able to name objects and repeat phrases. Appropriate fund of knowledge and normal coordination.  Neuropsychiatric Mental status exam performed with findings of-able to articulate well with normal speech/language, rate, volume and coherence and no evidence of  hallucinations, delusions, obsessions or homicidal/suicidal ideation. Orientation-oriented X3.  Musculoskeletal Global Assessment Gait and Station - normal gait and station.  Lymphatic General Lymphatics Description - No Generalized lymphadenopathy.    Assessment & Plan   DECREASED ANAL SPHINCTER TONE (K62.89) Impression: Fair anal sphincter function given having to remove the pelvic tumor off the anal  sphincter.  She finished pelvic floor physical therapy to help regain some of that function and see how much she can regain, determine how adequate it will be.  She wishes to proceed with ileostomy takedown and take her chances with some fecal incontinence episodes. Hopefully would improve over the first few months & through the next year. She started withissues of constipation anyway. However would like to see if some function can be regained before proceeding with ileostomy takedown.  Update: improved.  Patient wishes to proceed   Current Plans Pt Education - [facility] Pelvic Floor Exercises (Kegels) and Dysfunction HCI (Joselyne Spake) GASTROINTESTINAL STROMAL TUMOR (GIST) OF LARGE INTESTINE (C49.A4) Impression: Bulky tumor in the rectovaginal septum consistent with gastrointestinal stromal tumor. Most likely rectal wall etiology. Endorectal ultrasound and MRI show no invasion into rectum or vagina. Excised through combined robotic and transperineal approach. We'll anal handsewn anastomosis with diverting loop ileostomy. Delayed coloanal anastomotic leak.  Pathology did have questionable positive margins by thinks it's more disruption of the major tumor itself especially of the rectum and not really a true positive margin.  Nonetheless, it does not change the recommendation by medical oncology to continue [place] post-adjuvantly for a total of 3 years. Think that makes sense. Follow-up with Dr. Benay Spice for further insights in the future.  We will obtain TRANSANAL enema to evaluate the  neorectum 6 weeks after last test = September. If there is no evidence of pelvic leak nor any evidence of any cecal disruption, proceed with ileostomy takedown and removal of giant seroma/probable panniculectomy.  I had a long discussion with her. My concern with the decreased anal sphincter tone is that she may not tolerate it and may need to be converted to a permanent colostomy. However she did make some progress with pelvic physical therapy this spring. Her sphincter tone is not normal but it is present and the best I felt since surgery. She does not want to have a permanent ileostomy. She would like to avoid a permanent ileostomy if possible. She would like to give it a try. We will see. Current Plans Follow Up - Call [facility] office after tests / studies done to discuss further plans The anatomy & physiology of the digestive tract was discussed. The pathophysiology was discussed. Possibility of remaining with an ostomy permanently was discussed. I offered ostomy takedown. Laparoscopic & open techniques were discussed.  Risks such as bleeding, infection, abscess, leak, reoperation, possible re-ostomy, injury to other organs, hernia, heart attack, death, and other risks were discussed. I noted a good likelihood this will help address the problem. Goals of post-operative recovery were discussed as well. We will work to minimize complications. Questions were answered. The patient expresses understanding & wishes to proceed with surgery.  You are being scheduled for surgery - Our schedulers will call you.  You should hear from our office's scheduling department within 5 working days about the location, date, and time of surgery. We try to make accommodations for patient's preferences in scheduling surgery, but sometimes the OR schedule or the surgeon's schedule prevents [place] from making those accommodations.  If you have not heard from our office [phone]) in 5 working days, call the office and ask for  your surgeon's nurse.  If you have other questions about your diagnosis, plan, or surgery, call the office and ask for your surgeon's nurse.  Written instructions provided Pt Education - [facility] Good Bowel Health (Zeno Hickel)  ABDOMINAL WALL SEROMA, SEQUELA (T88.8XXS) Impression: She does have any large anterior abdominal  wall fluid collection consistent with chronic fluid/seroma in hernia sac and a moderate infraumbilical panniculus. It is a separate issue. While the seroma has been there since 2007, it has gotten slightly larger. Her uterus resection pathology was decades ago but she assures me it was not for cancer and was for prolapse issues.  She would like it removed at the time of the ileostomy takedown. Pain & discomfort there for years.  That would make the incision larger. Plan to get rid of the excess tissue & closed with an incisional wound VAC. Without it, likely to have more skin problems or hernia recurrence or other issues.  Adin [name], M.D., F.A.C.S. Gastrointestinal and Minimally Invasive Surgery [facility] [address] [place], [place] [address] [phone] Main / Paging

## 2016-01-28 NOTE — Interval H&P Note (Signed)
History and Physical Interval Note:  01/28/2016 10:11 AM  Brianna Walker  has presented today for surgery, with the diagnosis of elvic gastrointestinal stromal tumor status post excision with fecal diversion.  Chronic abdominal wall seroma in panniculus  The various methods of treatment have been discussed with the patient and family. After consideration of risks, benefits and other options for treatment, the patient has consented to  Procedure(s): TAKEDOWN OF LOOP ILEOSTOMY (N/A) RIGID PROCTOSCOPY (N/A) EXCISION ABDOMINAL WALL SEROMA (N/A) as a surgical intervention .  The patient's history has been reviewed, patient examined, no change in status, stable for surgery.  I have reviewed the patient's chart and labs.  Questions were answered to the patient's satisfaction.     Cassundra Mckeever C.

## 2016-01-28 NOTE — Anesthesia Procedure Notes (Signed)
Procedure Name: Intubation Date/Time: 01/28/2016 11:03 AM Performed by: Dione Booze Pre-anesthesia Checklist: Emergency Drugs available, Suction available, Patient being monitored and Patient identified Patient Re-evaluated:Patient Re-evaluated prior to inductionOxygen Delivery Method: Circle system utilized Preoxygenation: Pre-oxygenation with 100% oxygen Intubation Type: IV induction Laryngoscope Size: Mac and 4 Grade View: Grade I Tube type: Oral Tube size: 7.5 mm Number of attempts: 1 Airway Equipment and Method: Stylet Placement Confirmation: positive ETCO2,  breath sounds checked- equal and bilateral and ETT inserted through vocal cords under direct vision Secured at: 21 cm Tube secured with: Tape Dental Injury: Teeth and Oropharynx as per pre-operative assessment

## 2016-01-28 NOTE — Anesthesia Preprocedure Evaluation (Signed)
Anesthesia Evaluation  Patient identified by MRN, date of birth, ID band Patient awake    Reviewed: NPO status , Patient's Chart, lab work & pertinent test results  Airway Mallampati: I  TM Distance: >3 FB Neck ROM: Full    Dental  (+) Teeth Intact, Dental Advisory Given   Pulmonary former smoker,    breath sounds clear to auscultation       Cardiovascular hypertension, Pt. on medications + Peripheral Vascular Disease   Rhythm:Regular Rate:Normal     Neuro/Psych    GI/Hepatic GERD  Medicated and Controlled,  Endo/Other  Morbid obesity  Renal/GU      Musculoskeletal   Abdominal   Peds  Hematology   Anesthesia Other Findings   Reproductive/Obstetrics                             Anesthesia Physical Anesthesia Plan  ASA: II  Anesthesia Plan: General   Post-op Pain Management:    Induction: Intravenous  Airway Management Planned: Oral ETT  Additional Equipment:   Intra-op Plan:   Post-operative Plan: Extubation in OR  Informed Consent: I have reviewed the patients History and Physical, chart, labs and discussed the procedure including the risks, benefits and alternatives for the proposed anesthesia with the patient or authorized representative who has indicated his/her understanding and acceptance.   Dental advisory given  Plan Discussed with: CRNA, Anesthesiologist and Surgeon  Anesthesia Plan Comments:         Anesthesia Quick Evaluation

## 2016-01-28 NOTE — Op Note (Addendum)
01/28/2016  3:21 PM  PATIENT:  Brianna Walker  70 y.o. 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   Chronic abdominal wall cystic mass   PROCEDURE:   TAKEDOWN OF LOOP ILEOSTOMY RIGID PROCTOSCOPY DILATION OF COLORECTAL ANASTOMOTIC STRICTURE EXCISION  ABDOMINAL WALL CYSTIC MASS  Surgeon(s): Michael Boston, MD  ASSISTANT: Olene Floss, PA-S, [facility]   ANESTHESIA:   local and general  EBL:  Total I/O In: 1800 [I.V.:1800] Out: 950 [Urine:550; Blood:400]  Delay start of Pharmacological VTE agent (>24hrs) due to surgical blood loss or risk of bleeding:  no  DRAINS:  19 [place] drains in the SQ.  The left upper quadrant removed drain has the tail going down towards the right lower quadrant.  Right upper quadrant goes down towards the left lower quadrant SQ    SPECIMEN:  Source of Specimen:  Loop ileostomy.  #2 lower abdominal wall subcutaneous tissues with cystic mass  DISPOSITION OF SPECIMEN:  PATHOLOGY  COUNTS:  YES  PLAN OF CARE: Admit to inpatient   PATIENT DISPOSITION:  PACU - hemodynamically stable.  INDICATION: Patient status post resection of gastrointestinal stromal tumor and deep pelvis requiring ilio fecal diversion.  Eventually healed.  Chronic abdominal wall cystic mass from prior surgeries.  Primary cystic in nature.  Enlarging in size with pain or discomfort.  She wishes resection  at the same time.  The anatomy & physiology of the digestive tract was discussed.  The pathophysiology was discussed.  Possibility of remaining with an ostomy permanently was discussed.  I offered ostomy takedown.  Laparoscopic & open techniques were discussed.   Risks such as bleeding, infection, abscess, leak, reoperation, possible re-ostomy, injury to other organs, need for repair of tissues / organs, need for further treatment, hernia, heart attack, death, and other risks were discussed.   I noted a good likelihood this will help address the problem.  Goals of post-operative recovery were discussed as well.  We will work to minimize complications.  Questions were answered.  The patient expresses understanding & wishes to proceed with surgery.  OR FINDINGS: Is a side-to-side stapled anastomosis of the distal ileum.  Patient an 18 x 15 x 15 cm thick-walled cystic mass in the lower abdominal subcutaneous taste tissues.  Densely adherent to the anterior fascia.  Most likely was chronic hematoma/fat necrosis in the setting of prior mesh.  No evidence of any purulence abscess or infection.  Anastomosis 4 cm from the anal verge with stricturing.  Dilated to 2 cm.  No evidence of any leak.:  Viable.  DESCRIPTION:   Informed consent was confirmed.   The patient received IV antibiotics & underwent general anesthesia without any difficulty.  Foley catheter was sterilely placed. SCDs were active during the entire case.   I examined the perineum.  The coloanal anastomosis was well-healed.  No evidence of abscess or fistula.  Sphincter tone decreased from normal but intact.  Able to do rigid proctoscopy across the anastomosis.  Colon soft and  normal.  I detected no need for delay of loop ileostomy takedown.  I sutured the loop ileostomy closed using a running silk stitch.  The abdomen was prepped and draped in a sterile fashion.  A surgical timeout confirmed our plan.  I made a biconcave curvilinear incision  transversely around the loop ileostomy.  I got into the subcutaneous tissues.  I used careful focused right angle dissection and sharp dissection.  Some focused cautery dissection as well.  That helped to free adhesions to the subcutaneous tisses & fascia.  I was able to enter into the peritoneum focally.  I did a gentle finger sweep.  Gradually came around circumferentially and freed the loop of ileum from remaining adhesions to the abdominal wall.  We were able eviscerate some bowel proximally and distally.   the distal ileal loop was quite narrowed but no obvious stricture.  Eventually freed off the intra-abdominal get some mobility.    I did a side-to-side stapled anastomosis using a 75 GIA.  I used a TA 60 to staple off the common defect.  I resected the loop ileostomy and the intra-abdominal wall component of the loop that had moderate adhesions.  Took the mesentery with  bipolar energy.  I closed the mesenteric defect using interrupted stitches, covering the staple line.  The anastomosis looked healthy and viable.  Silk stitch placed at the crotch of the anastomosis.  We returned the anastomosis into the abdominal cavity.  Finger sweep circumferentially noted no adhesions.  It rested well.  We changed gloves and instruments.  Irrigated copiously into the wound and fascia.  proceeded to excise the large thick cystic wall mass of the fascia.  It would not come off sign and decided to leave the posterior wall adherent to the fascia.  I decompressed large amount of dark reddish-brown material consistent with a chronic serous hematoma.  At least moderate volume of necrotic fat and old blood.  Transected 9% of the cystic mass off, leaving 10% cuff on the anterior rectus fascia.  Having to excise fair amount of soft tissue and redundant skin to get around it.  Actually left with a nice open transverse wound.  I reapproximated the fascia transversely using #1 PDS stitches.   I ensured hemostasis.  I debride the  posterior wall of the cystic mass remaining on the anterior fascia to remove some necrotic fat and old hematoma.  Eventually after several liters of irrigation cleaning in a bike irrigation have a more healthy thick wall.  Seem like old biologic mesh in place.  A believe in place for risking any hernia formation since there was no abscess in the mesh seemed well incorporated.  I placed drains in the subcutaneous tissues as noted above.  I reapproximated the wound transversely using 2-0 Vicryl interrupted suture and deep dermal.  4 Monocryl running subcuticular.  Used her incisional wound VAC transversely across it.  The patient is being extubated & in the recovery room.  I discussed postop care with the patient in the office.  I discussed it in the holding area.  Instructions are written.  I discussed the patient's status to the family.  Questions were answered.  They expressed understanding & appreciation.  Adin [name], M.D., F.A.C.S. Gastrointestinal and Minimally Invasive Surgery [facility] [address], [place], [place] [address] [phone] Main / Paging  Adin [name], M.D., F.A.C.S. Gastrointestinal and Minimally Invasive Surgery [facility] [address], [place],  [phone] Main / Paging

## 2016-01-28 NOTE — Transfer of Care (Signed)
Immediate Anesthesia Transfer of Care Note  Patient: Brianna Walker  Procedure(s) Performed: Procedure(s): TAKEDOWN OF LOOP ILEOSTOMY (N/A) RIGID PROCTOSCOPY, DILATION OF COLORECTAL ANASTOMOTIC STRICTURE (N/A) EXCISION  ABDOMINAL WALL MASS, CYST, QUESTION FAT NECROSIS, QUESTION SEROMA (N/A)  Patient Location: PACU  Anesthesia Type:General  Level of Consciousness: awake, oriented, patient cooperative, lethargic and responds to stimulation  Airway & Oxygen Therapy: Patient Spontanous Breathing and Patient connected to face mask oxygen  Post-op Assessment: Report given to RN, Post -op Vital signs reviewed and stable and Patient moving all extremities  Post vital signs: Reviewed and stable  Last Vitals:  Vitals:   01/28/16 0838  BP: (!) 165/75  Pulse: 70  Resp: 18  Temp: 36.9 C    Last Pain:  Vitals:   01/28/16 0838  TempSrc: Oral      Patients Stated Pain Goal: 4 ([id number])  Complications: No apparent anesthesia complications

## 2016-01-29 LAB — BASIC METABOLIC PANEL
ANION GAP: 5 (ref 5–15)
BUN: 12 mg/dL (ref 6–20)
CALCIUM: 8.3 mg/dL — AB (ref 8.9–10.3)
CO2: 26 mmol/L (ref 22–32)
Chloride: 111 mmol/L (ref 101–111)
Creatinine, Ser: 1.04 mg/dL — ABNORMAL HIGH (ref 0.44–1.00)
GFR calc Af Amer: >60 mL/min (ref >60)
GFR calc non Af Amer: 54 mL/min — ABNORMAL LOW (ref >60)
GLUCOSE: 131 mg/dL — AB (ref 65–99)
Potassium: 4 mmol/L (ref 3.5–5.1)
Sodium: 142 mmol/L (ref 135–145)

## 2016-01-29 LAB — CBC
HEMATOCRIT: 24.5 % — AB (ref 36.0–46.0)
HEMOGLOBIN: 8.2 g/dL — AB (ref 12.0–15.0)
MCH: 29.1 pg (ref 26.0–34.0)
MCHC: 33.5 g/dL (ref 30.0–36.0)
MCV: 86.9 fL (ref 78.0–100.0)
Platelets: 262 10*3/uL (ref 150–400)
RBC: 2.82 MIL/uL — ABNORMAL LOW (ref 3.87–5.11)
RDW: 14.5 % (ref 11.5–15.5)
WBC: 11.8 10*3/uL — ABNORMAL HIGH (ref 4.0–10.5)

## 2016-01-29 LAB — MAGNESIUM: Magnesium: 1.7 mg/dL (ref 1.7–2.4)

## 2016-01-29 NOTE — Progress Notes (Signed)
Patient ID: Brianna Walker, female   DOB: 06-Jun-1945, 70 y.o.   MRN: [id number] [facility] Progress Note:   1 Day Post-Op  Subjective: Mental status is clear and talkative.  No complaints Objective: Vital signs in last 24 hours: Temp:  [97.4 F (36.3 C)-98.2 F (36.8 C)] 98.2 F (36.8 C) (09/30 0542) Pulse Rate:  [45-59] 54 (09/30 0542) Resp:  [10-21] 16 (09/30 0542) BP: (98-135)/(55-73) 135/73 (09/30 0542) SpO2:  [100 %] 100 % (09/30 0542) Weight:  [88.1 kg (194 lb 4 oz)] 88.1 kg (194 lb 4 oz) (09/30 0500)  Intake/Output from previous day: 09/29 0701 - 09/30 0700 In: 3080 [P.O.:480; I.V.:2600] Out: 1965 [Urine:1450; Drains:115; Blood:400] Intake/Output this shift: No intake/output data recorded.  Physical Exam: Work of breathing is normal.  Minimal soreness in abdomen.  Passing flatus  Lab Results:  Results for orders placed or performed during the hospital encounter of 01/28/16 (from the past 48 hour(s))  Basic metabolic panel     Status: Abnormal   Collection Time: 01/29/16  4:33 AM  Result Value Ref Range   Sodium 142 135 - 145 mmol/L   Potassium 4.0 3.5 - 5.1 mmol/L   Chloride 111 101 - 111 mmol/L   CO2 26 22 - 32 mmol/L   Glucose, Bld 131 (H) 65 - 99 mg/dL   BUN 12 6 - 20 mg/dL   Creatinine, Ser 1.04 (H) 0.44 - 1.00 mg/dL   Calcium 8.3 (L) 8.9 - 10.3 mg/dL   GFR calc non Af Amer 54 (L) >60 mL/min   GFR calc Af Amer >60 >60 mL/min    Comment: (NOTE) The eGFR has been calculated using the CKD EPI equation. This calculation has not been validated in all clinical situations. eGFR's persistently <60 mL/min signify possible Chronic Kidney Disease.    Anion gap 5 5 - 15  CBC     Status: Abnormal   Collection Time: 01/29/16  4:33 AM  Result Value Ref Range   WBC 11.8 (H) 4.0 - 10.5 K/uL   RBC 2.82 (L) 3.87 - 5.11 MIL/uL   Hemoglobin 8.2 (L) 12.0 - 15.0 g/dL   HCT 24.5 (L) 36.0 - 46.0 %   MCV 86.9 78.0 - 100.0 fL   MCH 29.1 26.0 - 34.0 pg   MCHC 33.5  30.0 - 36.0 g/dL   RDW 14.5 11.5 - 15.5 %   Platelets 262 150 - 400 K/uL  Magnesium     Status: None   Collection Time: 01/29/16  4:33 AM  Result Value Ref Range   Magnesium 1.7 1.7 - 2.4 mg/dL    Radiology/Results: No results found.  Anti-infectives: Anti-infectives    Start     Dose/Rate Route Frequency Ordered Stop   01/28/16 2200  cefoTEtan (CEFOTAN) 2 g in dextrose 5 % 50 mL IVPB     2 g 100 mL/hr over 30 Minutes Intravenous Every 12 hours 01/28/16 1838 01/28/16 2220   01/28/16 1320  clindamycin (CLEOCIN) 900 mg, gentamicin (GARAMYCIN) 240 mg in sodium chloride 0.9 % 1,000 mL for intraperitoneal lavage  Status:  Discontinued       As needed 01/28/16 1341 01/28/16 1456   01/28/16 0832  cefoTEtan (CEFOTAN) 2 g in dextrose 5 % 50 mL IVPB     2 g 100 mL/hr over 30 Minutes Intravenous On call to O.R. 01/28/16 3419 01/28/16 1109   01/28/16 0600  clindamycin (CLEOCIN) 900 mg, gentamicin (GARAMYCIN) 240 mg in sodium chloride 0.9 % 1,000 mL for  intraperitoneal lavage  Status:  Discontinued      Intraperitoneal To Surgery 01/27/16 1337 01/28/16 1827      Assessment/Plan: Problem List: Patient Active Problem List   Diagnosis Date Noted  . Colorectal anastomotic stricture s/p dilation  01/28/2016  . Ileostomy takedown 01/28/2016 01/28/2016  . Subclinical thyrotoxicosis 11/18/2015  . Encounter for preventative adult health care exam with abnormal findings 09/14/2015  . Emesis   . Delayed gastric emptying 06/28/2015  . Necrosis of ileostomy surgical wound (HCC) 06/28/2015  . Obesity (BMI 30-39.9) 06/28/2015  . Cystic mass of anterior abdominal wall s/p excision 01/27/2016 06/28/2015  . Dehydration   . Protein-calorie malnutrition, severe (HCC) 06/23/2015  . Hypokalemia 06/23/2015  . AKI (acute kidney injury) (HCC) 06/21/2015  . 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 Iron deficiency anemia 12/07/2011  . Hyperlipidemia  09/12/2007    Taking full liquids today.  Doing well. 1 Day Post-Op    LOS: 1 day   Matt B. Martin, MD, FACS  [facility] [phone] beeper [phone]  01/29/2016 9:15 AM 

## 2016-01-30 NOTE — Progress Notes (Signed)
Patient ID: Brianna Walker, female   DOB: [birth year], 70 y.o.   MRN: [id number] [facility] Progress Note:   2 Days Post-Op  Subjective: Mental status is alert and clear.  No complaints Objective: Vital signs in last 24 hours: Temp:  [98 F (36.7 C)-98.9 F (37.2 C)] 98.4 F (36.9 C) (10/01 0615) Pulse Rate:  [55-86] 69 (10/01 0615) Resp:  [17-18] 18 (10/01 0615) BP: (127-157)/(62-70) 142/70 (10/01 0615) SpO2:  [97 %-100 %] 100 % (10/01 0615) Weight:  [87.9 kg (193 lb 12.9 oz)] 87.9 kg (193 lb 12.9 oz) (10/01 0500)  Intake/Output from previous day: 09/30 0701 - 10/01 0700 In: 1280 [P.O.:680; I.V.:600] Out: 4264 [Urine:4075; Drains:184; Stool:5] Intake/Output this shift: Total I/O In: 120 [P.O.:120] Out: -   Physical Exam: Work of breathing is not labored.  VAC functioning.  Having BMs.  Hungry.   Lab Results:  Results for orders placed or performed during the hospital encounter of 01/28/16 (from the past 48 hour(s))  Basic metabolic panel     Status: Abnormal   Collection Time: 01/29/16  4:33 AM  Result Value Ref Range   Sodium 142 135 - 145 mmol/L   Potassium 4.0 3.5 - 5.1 mmol/L   Chloride 111 101 - 111 mmol/L   CO2 26 22 - 32 mmol/L   Glucose, Bld 131 (H) 65 - 99 mg/dL   BUN 12 6 - 20 mg/dL   Creatinine, Ser 1.04 (H) 0.44 - 1.00 mg/dL   Calcium 8.3 (L) 8.9 - 10.3 mg/dL   GFR calc non Af Amer 54 (L) >60 mL/min   GFR calc Af Amer >60 >60 mL/min    Comment: (NOTE) The eGFR has been calculated using the CKD EPI equation. This calculation has not been validated in all clinical situations. eGFR's persistently <60 mL/min signify possible Chronic Kidney Disease.    Anion gap 5 5 - 15  CBC     Status: Abnormal   Collection Time: 01/29/16  4:33 AM  Result Value Ref Range   WBC 11.8 (H) 4.0 - 10.5 K/uL   RBC 2.82 (L) 3.87 - 5.11 MIL/uL   Hemoglobin 8.2 (L) 12.0 - 15.0 g/dL   HCT 24.5 (L) 36.0 - 46.0 %   MCV 86.9 78.0 - 100.0 fL   MCH 29.1 26.0 - 34.0 pg    MCHC 33.5 30.0 - 36.0 g/dL   RDW 14.5 11.5 - 15.5 %   Platelets 262 150 - 400 K/uL  Magnesium     Status: None   Collection Time: 01/29/16  4:33 AM  Result Value Ref Range   Magnesium 1.7 1.7 - 2.4 mg/dL    Radiology/Results: No results found.  Anti-infectives: Anti-infectives    Start     Dose/Rate Route Frequency Ordered Stop   01/28/16 2200  cefoTEtan (CEFOTAN) 2 g in dextrose 5 % 50 mL IVPB     2 g 100 mL/hr over 30 Minutes Intravenous Every 12 hours 01/28/16 1838 01/28/16 2220   01/28/16 1320  clindamycin (CLEOCIN) 900 mg, gentamicin (GARAMYCIN) 240 mg in sodium chloride 0.9 % 1,000 mL for intraperitoneal lavage  Status:  Discontinued       As needed 01/28/16 1341 01/28/16 1456   01/28/16 0832  cefoTEtan (CEFOTAN) 2 g in dextrose 5 % 50 mL IVPB     2 g 100 mL/hr over 30 Minutes Intravenous On call to O.R. 01/28/16 5625 01/28/16 1109   01/28/16 0600  clindamycin (CLEOCIN) 900 mg, gentamicin (GARAMYCIN) 240 mg in  sodium chloride 0.9 % 1,000 mL for intraperitoneal lavage  Status:  Discontinued      Intraperitoneal To Surgery 01/27/16 1337 01/28/16 1827      Assessment/Plan: Problem List: Patient Active Problem List   Diagnosis Date Noted  . Colorectal anastomotic stricture s/p dilation  01/28/2016  . Ileostomy takedown 01/28/2016 01/28/2016  . Subclinical thyrotoxicosis 11/18/2015  . Encounter for preventative adult health care exam with abnormal findings 09/14/2015  . Emesis   . Delayed gastric emptying 06/28/2015  . Necrosis of ileostomy surgical wound ([place]) 06/28/2015  . Obesity (BMI 30-39.9) 06/28/2015  . Cystic mass of anterior abdominal wall s/p excision 01/27/2016 06/28/2015  . Dehydration   . Protein-calorie malnutrition, severe ([place]) 06/23/2015  . Hypokalemia 06/23/2015  . AKI (acute kidney injury) ([place]) 06/21/2015  . 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 Iron deficiency anemia 12/07/2011  .  Hyperlipidemia 09/12/2007    Will advance diet to regular.  Improving 2 Days Post-Op    LOS: 2 days   Matt B. Hassell Done, MD, [facility] [phone] beeper [phone]  01/30/2016 10:04 AM

## 2016-01-31 LAB — BASIC METABOLIC PANEL
Anion gap: 6 (ref 5–15)
BUN: 7 mg/dL (ref 6–20)
CHLORIDE: 107 mmol/L (ref 101–111)
CO2: 30 mmol/L (ref 22–32)
Calcium: 8.8 mg/dL — ABNORMAL LOW (ref 8.9–10.3)
Creatinine, Ser: 0.83 mg/dL (ref 0.44–1.00)
GFR calc non Af Amer: >60 mL/min (ref >60)
Glucose, Bld: 96 mg/dL (ref 65–99)
POTASSIUM: 2.9 mmol/L — AB (ref 3.5–5.1)
SODIUM: 143 mmol/L (ref 135–145)

## 2016-01-31 LAB — CBC
HCT: 26.9 % — ABNORMAL LOW (ref 36.0–46.0)
HEMOGLOBIN: 8.9 g/dL — AB (ref 12.0–15.0)
MCH: 29.6 pg (ref 26.0–34.0)
MCHC: 33.1 g/dL (ref 30.0–36.0)
MCV: 89.4 fL (ref 78.0–100.0)
Platelets: 260 10*3/uL (ref 150–400)
RBC: 3.01 MIL/uL — AB (ref 3.87–5.11)
RDW: 14.5 % (ref 11.5–15.5)
WBC: 5.7 10*3/uL (ref 4.0–10.5)

## 2016-01-31 MED ORDER — POTASSIUM CHLORIDE 10 MEQ/100ML IV SOLN
10.0000 meq | INTRAVENOUS | Status: AC
  Administered 2016-01-31 – 2016-02-01 (×2): 10 meq via INTRAVENOUS
  Filled 2016-01-31: qty 100

## 2016-01-31 MED ORDER — BISMUTH SUBSALICYLATE 262 MG/15ML PO SUSP
30.0000 mL | Freq: Three times a day (TID) | ORAL | Status: DC | PRN
  Administered 2016-02-02: 30 mL via ORAL
  Filled 2016-01-31: qty 118

## 2016-01-31 MED ORDER — BISMUTH SUBSALICYLATE 262 MG/15ML PO SUSP
30.0000 mL | Freq: Once | ORAL | Status: AC
  Administered 2016-01-31: 30 mL via ORAL
  Filled 2016-01-31: qty 118

## 2016-01-31 MED ORDER — POTASSIUM CHLORIDE 10 MEQ/100ML IV SOLN
10.0000 meq | INTRAVENOUS | Status: AC
  Administered 2016-01-31 (×2): 10 meq via INTRAVENOUS
  Filled 2016-01-31 (×2): qty 100

## 2016-01-31 MED ORDER — FERROUS SULFATE 325 (65 FE) MG PO TABS
325.0000 mg | ORAL_TABLET | Freq: Two times a day (BID) | ORAL | Status: DC
  Administered 2016-01-31 (×2): 325 mg via ORAL
  Filled 2016-01-31 (×2): qty 1

## 2016-01-31 MED ORDER — POTASSIUM CHLORIDE CRYS ER 20 MEQ PO TBCR
40.0000 meq | EXTENDED_RELEASE_TABLET | Freq: Every day | ORAL | Status: AC
  Administered 2016-01-31 – 2016-02-02 (×3): 40 meq via ORAL
  Filled 2016-01-31 (×3): qty 2

## 2016-01-31 MED ORDER — BISMUTH SUBSALICYLATE 262 MG/15ML PO SUSP: 30.0000 mL | Freq: Two times a day (BID) | ORAL | Status: DC

## 2016-01-31 MED ORDER — TRAMADOL HCL 50 MG PO TABS: 50.0000 mg | ORAL_TABLET | Freq: Four times a day (QID) | ORAL | Status: DC | PRN

## 2016-01-31 NOTE — Progress Notes (Addendum)
[facility] Progress Note  3 Days Post-Op  Subjective: States no pain, sleeping well, ambulating in room only.  Loose bowel movements 5-6 times per day with "a little blood" in stool.  Urinating and flatus.   Strong appetite.  Objective: Vital signs in last 24 hours: Temp:  [97.9 F (36.6 C)-98.7 F (37.1 C)] 98.7 F (37.1 C) (10/01 2151) Pulse Rate:  [69-82] 76 (10/01 2151) Resp:  [18] 18 (10/01 2151) BP: (142-165)/(69-86) 164/69 (10/01 2151) SpO2:  [99 %-100 %] 99 % (10/01 2151) Last BM Date: 01/30/16  Intake/Output from previous day: 10/01 0701 - 10/02 0700 In: 1830 [P.O.:480; I.V.:1350] Out: 235 [Drains:235] Intake/Output this shift: Total I/O In: 590 [P.O.:240; I.V.:350] Out: 27 [Drains:60]  PE: Gen:  Alert, NAD, pleasant.  Patient appears comfortable, seated on bed.   Card:  RRR, no M/G/R heard Pulm:  CTA, no W/R/R Abd: Soft, NT/ND, +BS, wound vac seal intact, drains with scant sanguinous drainage Ext:  No erythema, edema, or tenderness  Lab Results:   Recent Labs  01/29/16 0433  WBC 11.8*  HGB 8.2*  HCT 24.5*  PLT 262   BMET  Recent Labs  01/29/16 0433  NA 142  K 4.0  CL 111  CO2 26  GLUCOSE 131*  BUN 12  CREATININE 1.04*  CALCIUM 8.3*   PT/INR No results for input(s): LABPROT, INR in the last 72 hours. CMP     Component Value Date/Time   NA 142 01/29/2016 0433   NA 145 01/17/2016 1439   K 4.0 01/29/2016 0433   K 4.4 01/17/2016 1439   CL 111 01/29/2016 0433   CO2 26 01/29/2016 0433   CO2 24 01/17/2016 1439   GLUCOSE 131 (H) 01/29/2016 0433   GLUCOSE 79 01/17/2016 1439   GLUCOSE 109 (H) 04/26/2006 1434   BUN 12 01/29/2016 0433   BUN 10.2 01/17/2016 1439   CREATININE 1.04 (H) 01/29/2016 0433   CREATININE 1.2 (H) 01/17/2016 1439   CALCIUM 8.3 (L) 01/29/2016 0433   CALCIUM 9.3 01/17/2016 1439   PROT 8.0 01/17/2016 1439   ALBUMIN 3.3 (L) 01/17/2016 1439   AST 15 01/17/2016 1439   ALT 10 01/17/2016 1439   ALKPHOS 125  01/17/2016 1439   BILITOT 0.58 01/17/2016 1439   GFRNONAA 54 (L) 01/29/2016 0433   GFRAA >60 01/29/2016 0433   Lipase     Component Value Date/Time   LIPASE 85 (H) 06/21/2015 0741       Studies/Results: No results found.  Anti-infectives: Anti-infectives    Start     Dose/Rate Route Frequency Ordered Stop   01/28/16 2200  cefoTEtan (CEFOTAN) 2 g in dextrose 5 % 50 mL IVPB     2 g 100 mL/hr over 30 Minutes Intravenous Every 12 hours 01/28/16 1838 01/28/16 2220   01/28/16 1320  clindamycin (CLEOCIN) 900 mg, gentamicin (GARAMYCIN) 240 mg in sodium chloride 0.9 % 1,000 mL for intraperitoneal lavage  Status:  Discontinued       As needed 01/28/16 1341 01/28/16 1456   01/28/16 0832  cefoTEtan (CEFOTAN) 2 g in dextrose 5 % 50 mL IVPB     2 g 100 mL/hr over 30 Minutes Intravenous On call to O.R. 01/28/16 [id number] 01/28/16 1109   01/28/16 0600  clindamycin (CLEOCIN) 900 mg, gentamicin (GARAMYCIN) 240 mg in sodium chloride 0.9 % 1,000 mL for intraperitoneal lavage  Status:  Discontinued      Intraperitoneal To Surgery 01/27/16 1337 01/28/16 1827       Assessment/Plan  Diet - continue solid foods, tolerating well Pain - well-controlled, transition to oral pain rx Med lock - stop IV fluids Diarrhea - start on Pepto-Bismol for diarrhea Encourage ambulation in hallways Anemia - recheck labs today   LOS: 3 days   Olene Floss, PA-S [employer]  HTN - mostly controlled - has PRNs GERD - controlled Check Hgb - back on Eliquis with h/o PEs.  Some hematochezia but not severe Try to get portable system for incisional wound vac   D/C patient from hospital when patient meets criteria (anticipate in 1-2 day(s)):  Tolerating oral intake well Ambulating well Adequate pain control without IV medications Urinating  Having flatus BMs controlled Hgb stable Disposition planning in place

## 2016-02-01 ENCOUNTER — Encounter (HOSPITAL_COMMUNITY): Payer: Self-pay | Admitting: Surgery

## 2016-02-01 LAB — CREATININE, SERUM
Creatinine, Ser: 0.87 mg/dL (ref 0.44–1.00)
GFR calc non Af Amer: >60 mL/min (ref >60)

## 2016-02-01 LAB — HEMOGLOBIN: HEMOGLOBIN: 8.8 g/dL — AB (ref 12.0–15.0)

## 2016-02-01 LAB — MAGNESIUM: Magnesium: 1.8 mg/dL (ref 1.7–2.4)

## 2016-02-01 LAB — POTASSIUM: POTASSIUM: 3.3 mmol/L — AB (ref 3.5–5.1)

## 2016-02-01 MED ORDER — PSYLLIUM 95 % PO PACK
1.0000 | PACK | Freq: Every day | ORAL | Status: DC
  Administered 2016-02-01 – 2016-02-02 (×2): 1 via ORAL
  Filled 2016-02-01 (×2): qty 1

## 2016-02-01 MED ORDER — SIMETHICONE 80 MG PO CHEW: 80.0000 mg | CHEWABLE_TABLET | Freq: Four times a day (QID) | ORAL | Status: DC | PRN

## 2016-02-01 MED ORDER — LOPERAMIDE HCL 2 MG PO CAPS
2.0000 mg | ORAL_CAPSULE | Freq: Every day | ORAL | Status: DC
  Administered 2016-02-01: 2 mg via ORAL
  Filled 2016-02-01: qty 1

## 2016-02-01 MED ORDER — MAGNESIUM SULFATE 2 GM/50ML IV SOLN
2.0000 g | Freq: Once | INTRAVENOUS | Status: AC
  Administered 2016-02-01: 2 g via INTRAVENOUS
  Filled 2016-02-01: qty 50

## 2016-02-01 MED ORDER — LOPERAMIDE HCL 2 MG PO CAPS: 2.0000 mg | ORAL_CAPSULE | Freq: Once | ORAL | Status: DC

## 2016-02-01 MED ORDER — POTASSIUM CHLORIDE 10 MEQ/100ML IV SOLN
INTRAVENOUS | Status: AC
  Filled 2016-02-01: qty 100

## 2016-02-01 MED ORDER — LOPERAMIDE HCL 2 MG PO CAPS
2.0000 mg | ORAL_CAPSULE | Freq: Three times a day (TID) | ORAL | Status: DC | PRN
  Administered 2016-02-02: 4 mg via ORAL
  Filled 2016-02-01 (×2): qty 2

## 2016-02-01 MED ORDER — FERROUS SULFATE 325 (65 FE) MG PO TABS
325.0000 mg | ORAL_TABLET | Freq: Three times a day (TID) | ORAL | Status: DC
  Administered 2016-02-01 – 2016-02-02 (×3): 325 mg via ORAL
  Filled 2016-02-01 (×4): qty 1

## 2016-02-01 MED ORDER — LOPERAMIDE HCL 2 MG PO CAPS
2.0000 mg | ORAL_CAPSULE | Freq: Once | ORAL | Status: AC
  Administered 2016-02-01: 2 mg via ORAL
  Filled 2016-02-01: qty 1

## 2016-02-01 NOTE — Progress Notes (Signed)
[facility] Progress Note  4 Days Post-Op  Subjective: Patient denies pain and need for pain medication Slept well through the night Walked in hallways yesterday Denies improvement with diarrhea, claims about 10 bowel movements in past 24 hours, most of which were episodes of incontinence as she didn't make it to the bedside commode Denies blood in stool  Having flatus, no complaints with urination  Objective: Vital signs in last 24 hours: Temp:  [98.3 F (36.8 C)-98.7 F (37.1 C)] 98.3 F (36.8 C) (10/03 0518) Pulse Rate:  [67-91] 78 (10/03 0518) Resp:  [18] 18 (10/03 0518) BP: (140-142)/(83-86) 142/86 (10/03 0518) SpO2:  [97 %-100 %] 99 % (10/03 0518) Weight:  [197 lb 8.5 oz (89.6 kg)] 197 lb 8.5 oz (89.6 kg) (10/03 0518) Last BM Date: 01/31/16  Intake/Output from previous day: 10/02 0701 - 10/03 0700 In: 700 [P.O.:600; IV Piggyback:100] Out: 75 [Drains:75] Intake/Output this shift: No intake/output data recorded.  PE: Gen:  Alert, NAD, pleasant Card:  RRR, no M/G/R heard Pulm:  CTA, no W/R/R Abd: Soft, NT/ND, +BS, no HSM, incisions C/D/I, wound vac intact, drains with minimal sanguinous drainage Ext:  No erythema, edema, or tenderness  Lab Results:   Recent Labs  01/31/16 0840 02/01/16 0633  WBC 5.7  --   HGB 8.9* 8.8*  HCT 26.9*  --   PLT 260  --    BMET  Recent Labs  01/31/16 0840 02/01/16 0633  NA 143  --   K 2.9* 3.3*  CL 107  --   CO2 30  --   GLUCOSE 96  --   BUN 7  --   CREATININE 0.83 0.87  CALCIUM 8.8*  --    PT/INR No results for input(s): LABPROT, INR in the last 72 hours. CMP     Component Value Date/Time   NA 143 01/31/2016 0840   NA 145 01/17/2016 1439   K 3.3 (L) 02/01/2016 0633   K 4.4 01/17/2016 1439   CL 107 01/31/2016 0840   CO2 30 01/31/2016 0840   CO2 24 01/17/2016 1439   GLUCOSE 96 01/31/2016 0840   GLUCOSE 79 01/17/2016 1439   GLUCOSE 109 (H) 04/26/2006 1434   BUN 7 01/31/2016 0840   BUN 10.2  01/17/2016 1439   CREATININE 0.87 02/01/2016 0633   CREATININE 1.2 (H) 01/17/2016 1439   CALCIUM 8.8 (L) 01/31/2016 0840   CALCIUM 9.3 01/17/2016 1439   PROT 8.0 01/17/2016 1439   ALBUMIN 3.3 (L) 01/17/2016 1439   AST 15 01/17/2016 1439   ALT 10 01/17/2016 1439   ALKPHOS 125 01/17/2016 1439   BILITOT 0.58 01/17/2016 1439   GFRNONAA >60 02/01/2016 0633   GFRAA >60 02/01/2016 0633   Lipase     Component Value Date/Time   LIPASE 85 (H) 06/21/2015 0741       Studies/Results: No results found.  Anti-infectives: Anti-infectives    Start     Dose/Rate Route Frequency Ordered Stop   01/28/16 2200  cefoTEtan (CEFOTAN) 2 g in dextrose 5 % 50 mL IVPB     2 g 100 mL/hr over 30 Minutes Intravenous Every 12 hours 01/28/16 1838 01/28/16 2220   01/28/16 1320  clindamycin (CLEOCIN) 900 mg, gentamicin (GARAMYCIN) 240 mg in sodium chloride 0.9 % 1,000 mL for intraperitoneal lavage  Status:  Discontinued       As needed 01/28/16 1341 01/28/16 1456   01/28/16 0832  cefoTEtan (CEFOTAN) 2 g in dextrose 5 % 50 mL IVPB  2 g 100 mL/hr over 30 Minutes Intravenous On call to O.R. 01/28/16 [id number] 01/28/16 1109   01/28/16 0600  clindamycin (CLEOCIN) 900 mg, gentamicin (GARAMYCIN) 240 mg in sodium chloride 0.9 % 1,000 mL for intraperitoneal lavage  Status:  Discontinued      Intraperitoneal To Surgery 01/27/16 1337 01/28/16 1827       Assessment/Plan Diet - continue solid foods, tolerating well Pain - well-controlled Med lock Diarrhea - no improvement (Pepto Bismol in room but pt did not take it, claims did not realize she could).  Start on imodium today. Continue ambulation in hallways Wound - portable incisional wound vac in room, will hook up at discharge. Anemia - hematochezia resolved.  Hgb checked this AM, no improvement from yesterday, decr from 8.9 to 8.8.  Recheck labs today Hypertension - improving GERD - controlled  LOS: 4 days   Olene Floss, PA-S [facility]   D/C  patient from hospital when patient meets criteria (anticipate in 1-2 day(s)):  Tolerating oral intake well Ambulating well Adequate pain control without IV medications Urinating  Having flatus BMs controlled Hgb stable Disposition planning in place

## 2016-02-01 NOTE — Progress Notes (Signed)
Received a call from wound nurse Ricky about [employer] wound vac system. If patient needs assistance please call [phone].

## 2016-02-01 NOTE — Progress Notes (Signed)
Pt states she has minimal amount of blood in stools tonight, states she thinks it is from past hemrrhoids. On call notified.

## 2016-02-02 ENCOUNTER — Encounter (HOSPITAL_COMMUNITY): Payer: Self-pay | Admitting: Surgery

## 2016-02-02 MED ORDER — PSYLLIUM 95 % PO PACK: 1.0000 | PACK | Freq: Every day | ORAL | 0 refills | Status: DC

## 2016-02-02 MED ORDER — LOPERAMIDE HCL 2 MG PO CAPS: 2.0000 mg | ORAL_CAPSULE | Freq: Two times a day (BID) | ORAL | 2 refills | Status: DC

## 2016-02-02 MED ORDER — POTASSIUM CHLORIDE CRYS ER 20 MEQ PO TBCR: 40.0000 meq | EXTENDED_RELEASE_TABLET | Freq: Two times a day (BID) | ORAL | 0 refills | Status: DC

## 2016-02-02 MED ORDER — FERROUS SULFATE 325 (65 FE) MG PO TABS: 325.0000 mg | ORAL_TABLET | Freq: Three times a day (TID) | ORAL | 3 refills | Status: DC

## 2016-02-02 MED ORDER — SIMETHICONE 80 MG PO CHEW: 80.0000 mg | CHEWABLE_TABLET | Freq: Four times a day (QID) | ORAL | 0 refills | Status: DC | PRN

## 2016-02-02 NOTE — Progress Notes (Addendum)
Discharge instructions reviewed with patient. Patient verbalized understanding. JP care and instructions reviewed with return demonstration . Patient states she feels comfortable with caring for JP drains. Patient has been instructed on how to empty drains and how to document drainage. Recording sheets given and reviewed with the patient.Written instruction given for JP drain care drains are connected and charged. Prevena wound vac has been connected and patient has been given the booklet , charger and hotline number for the prevena. Patient had no questions or concerns at this time.

## 2016-02-02 NOTE — Progress Notes (Signed)
Patient transferred for 6E, unable to located vac pump that patient would be d/ced on, contacted [employer] rep, Rickie. She came to patient's room and found device. Nursing aware. [phone]

## 2016-02-02 NOTE — Discharge Summary (Signed)
Physician Discharge Summary  Patient ID: Brianna Walker MRN: [id number] DOB/AGE: December 17, 1945 70 y.o.  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  Chronic abdominal wall seroma in panniculus  SURGERY:  01/28/2016  Procedure(s): TAKEDOWN OF LOOP ILEOSTOMY RIGID PROCTOSCOPY, DILATION OF COLORECTAL ANASTOMOTIC STRICTURE EXCISION  ABDOMINAL WALL MASS, CYST, QUESTION FAT NECROSIS, QUESTION SEROMA  SURGEON:    Surgeon(s): Michael Boston, MD  Consults: None  Hospital Course:   The patient underwent takedown of loop ileostomy with rigid proctoscopy and abdominal wall mass excision.  Postoperatively, the patient gradually mobilized and advanced to a solid diet.  Pain and other symptoms were treated aggressively.     By the time of discharge, the patient was walking well the hallways, eating food, having flatus.  Pain was well-controlled on oral medications.  Patient had  frequent loose stools post-operatively, with multiple episodes of incontinence daily.  She received 3 doses of Imodium since yesterday morning, which slowed her bowels to more manageable frequency.  Still evident incontinence.  Wound vac will be switched to portable battery - located in pt room - at time of discharge.  Drains and wound vac dressing will be removed at office follow-up appt on 02/07/16.   Based on meeting discharge criteria and continuing to recover, I felt it was safe for the patient to be discharged from the hospital to further recover with close followup. Postoperative recommendations were discussed in detail.  They are written as well.   Significant Diagnostic Studies:  Results for orders placed or performed during the hospital encounter of 01/28/16 (from the past 72 hour(s))  CBC     Status: Abnormal   Collection Time: 01/31/16  8:40 AM  Result Value Ref Range   WBC 5.7 4.0 - 10.5 K/uL   RBC 3.01 (L) 3.87 - 5.11 MIL/uL   Hemoglobin 8.9 (L) 12.0 - 15.0 g/dL   HCT 26.9 (L) 36.0 - 46.0 %   MCV 89.4 78.0 - 100.0 fL   MCH 29.6 26.0 - 34.0 pg   MCHC 33.1 30.0 - 36.0 g/dL   RDW 14.5 11.5 - 15.5 %   Platelets 260 150 - 400 K/uL  Basic metabolic panel     Status: Abnormal   Collection Time: 01/31/16  8:40 AM  Result Value Ref Range   Sodium 143 135 - 145 mmol/L   Potassium 2.9 (L) 3.5 - 5.1 mmol/L   Chloride 107 101 - 111 mmol/L   CO2 30 22 - 32 mmol/L   Glucose, Bld 96 65 - 99 mg/dL   BUN 7 6 - 20 mg/dL   Creatinine,  Ser 0.83 0.44 - 1.00 mg/dL   Calcium 8.8 (L) 8.9 - 10.3 mg/dL   GFR calc non Af Amer >60 >60 mL/min   GFR calc Af Amer >60 >60 mL/min    Comment: (NOTE) The eGFR has been calculated using the CKD EPI equation. This calculation has not been validated in all clinical situations. eGFR's persistently <60 mL/min signify possible Chronic Kidney Disease.    Anion gap 6 5 - 15  Potassium     Status: Abnormal   Collection Time: 02/01/16  6:33 AM  Result Value  Ref Range   Potassium 3.3 (L) 3.5 - 5.1 mmol/L  Magnesium     Status: None   Collection Time: 02/01/16  6:33 AM  Result Value Ref Range   Magnesium 1.8 1.7 - 2.4 mg/dL  Creatinine, serum     Status: None   Collection Time: 02/01/16  6:33 AM  Result Value Ref Range   Creatinine, Ser 0.87 0.44 - 1.00 mg/dL   GFR calc non Af Amer >60 >60 mL/min   GFR calc Af Amer >60 >60 mL/min    Comment: (NOTE) The eGFR has been calculated using the CKD EPI equation. This calculation has not been validated in all clinical situations. eGFR's persistently <60 mL/min signify possible Chronic Kidney Disease.   Hemoglobin     Status: Abnormal   Collection Time: 02/01/16  6:33 AM  Result Value Ref Range   Hemoglobin 8.8 (L) 12.0 - 15.0 g/dL    No results found.  Discharge Exam: Blood pressure (!) 140/58, pulse 84, temperature 99 F (37.2 C), temperature source Oral, resp. rate 16, height _0  (1.626 m), weight 193 lb 12.6 oz (87.9 kg), SpO2 100 %.  General: Pt awake/alert/oriented x4, in no major acute distress Eyes: PERRL, normal EOM. Sclera nonicteric Neuro: CN II-XII intact w/o focal sensory/motor deficits. Lymph: No head/neck/groin lymphadenopathy Psych:  No delerium/psychosis/paranoia HENT: Normocephalic, Mucus membranes moist.  No thrush Neck: Supple, No tracheal deviation Chest: No pain.  Good respiratory excursion. CV:  Pulses intact. Regular rate and rhythm MS: Normal AROM mjr joints.  No obvious deformity Abdomen: Soft, non-distended, nontender.  +BS, incisions C/D/I, wound vac intact, drains with minimal sanguinous drainage Ext:  SCDs BLE.  No significant edema.  No cyanosis Skin: No petechiae / purpura  Discharged Condition: good   Past Medical History:  Diagnosis Date  . ALLERGIC RHINITIS 09/12/2007  . Arthritis   . BACK PAIN 06/19/2008  . Cancer ([place])    dx. 4'16 -oral chemotherapy only.  . Colon polyps   . Cramp of limb 08/11/2009  . DEEP VENOUS THROMBOPHLEBITIS, LEG,  RIGHT 03/04/2010   right leg  . Diverticulosis   . FREQUENCY, URINARY 10/07/2009  . GERD 12/05/2006  . HEMORRHOIDS 11/17/2006  . HIP PAIN, RIGHT, CHRONIC 08/11/2009  . HX, PERSONAL, TUBERCULOSIS 11/17/2006  . HYPERLIPIDEMIA 09/12/2007  . Hyperlipidemia 09/12/2007   Qualifier: Diagnosis of  By: Jenny Reichmann MD, Hunt Oris   . HYPERTENSION 11/17/2006  . INSOMNIA-SLEEP DISORDER-UNSPEC 06/19/2008  . Iron deficiency anemia 12/07/2011  . LEG PAIN, RIGHT 06/19/2008  . Long term (current) use of anticoagulants 11/29/2010  . MASS, SUPERFICIAL 09/12/2007  . MENOPAUSAL DISORDER 09/20/2009  . OVERACTIVE BLADDER 03/04/2010  . Perforation of colon ([place]) 11/17/2015  . Pneumonia   . PULMONARY EMBOLISM, HX OF 11/17/2006   [place] [facility] s/p Pneumonia  . RECTAL BLEEDING 10/07/2007  . Rectal mass 06/2014  . SCIATICA, RIGHT 09/12/2007  . Shortness of breath dyspnea  With exertion since chemotherapy tx-oral meds  . SKIN LESION 07/20/2009  . TMJ SYNDROME 11/17/2006  . Tuberculosis    pt. was tx.    Past Surgical History:  Procedure Laterality Date  . ABDOMINAL HYSTERECTOMY    . CHOLECYSTECTOMY OPEN    . EUS N/A 08/06/2014   Procedure: LOWER ENDOSCOPIC ULTRASOUND (EUS);  Surgeon: Milus Banister, MD;  Location: [facility];  Service: Endoscopy;  Laterality: N/A;  . EXCISION MASS ABDOMINAL N/A 01/28/2016   Procedure: EXCISION  ABDOMINAL WALL MASS, CYST, QUESTION FAT NECROSIS, QUESTION SEROMA;  Surgeon: Michael Boston, MD;  Location: [facility];  Service: General;  Laterality: N/A;  . hemorroid surgery  removed at dr Silvio Pate office   x 2  . ILEOSTOMY CLOSURE N/A 01/28/2016   Procedure: TAKEDOWN OF LOOP ILEOSTOMY;  Surgeon: Michael Boston, MD;  Location: [facility];  Service: General;  Laterality: N/A;  . [place]  2002     multiple ventral hernia repair/mesh  . INCISIONAL HERNIA REPAIR  01/13/2005   open w onlay Proceed mesh.  Marland Kitchen LAPAROSCOPIC LYSIS OF ADHESIONS  06/04/2015   Procedure: LAPAROSCOPIC LYSIS OF  ADHESIONS;  Surgeon: Michael Boston, MD;  Location: [facility];  Service: General;;  . PROCTOSCOPY N/A 01/28/2016   Procedure: RIGID PROCTOSCOPY, DILATION OF COLORECTAL ANASTOMOTIC STRICTURE;  Surgeon: Michael Boston, MD;  Location: [facility];  Service: General;  Laterality: N/A;  . s/p right hip replaced min invasive hip surgury [facility] oct 2011 Right 2011  . tumor removed off of right shoulder Right   . XI ROBOTIC ASSISTED LOWER ANTERIOR RESECTION N/A 06/04/2015   Procedure: XI ROBOTIC ASSISTED LYSIS OF ADHESIONS, LOWER ANTERIOR RESECTION TRANS ABDOMINAL AND TRANS ANAL, COLOANAL HANDSEWN ANASTAOSIS, DIVERTING LOPE ILIOSTOMY, RESECTION GIST TUMOR;  Surgeon: Michael Boston, MD;  Location: [facility];  Service: General;  Laterality: N/A;    Social History   Social History  . Marital status: Married    Spouse name: N/A  . Number of children: 2  . Years of education: N/A   Occupational History  . retired    Social History Main Topics  . Smoking status: Former Smoker    Packs/day: 0.00    Types: Cigarettes    Quit date: 06/09/1977  . Smokeless tobacco: Never Used  . Alcohol use 0.0 oz/week     Comment: rare  . Drug use: No     Comment: past marijuana  . Sexual activity: Not on file   Other Topics Concern  . Not on file   Social History Narrative   Husband civil Chief Financial Officer [place]   Has #2 children (one son at home with her)   Prior employment-various customer service positions    Family History  Problem Relation Age of Onset  . Colon cancer Father   . Heart disease Mother   . Heart disease Sister   . Diabetes Sister     Current Facility-Administered Medications  Medication Dose Route Frequency Provider Last Rate Last Dose  . acetaminophen (TYLENOL) tablet 1,000 mg  1,000 mg Oral TID Michael Boston, MD   1,000 mg at 02/01/16 2138  . apixaban (ELIQUIS) tablet 5 mg  5 mg Oral BID Michael Boston, MD   5 mg at 02/01/16 2138  . aspirin EC tablet 81 mg  81 mg Oral QHS Michael Boston, MD   81 mg at  02/01/16 2137  . benazepril (LOTENSIN) tablet 40 mg  40 mg Oral QPM Michael Boston, MD   40 mg at 02/01/16 1830  .  bismuth subsalicylate (PEPTO BISMOL) 262 MG/15ML suspension 30 mL  30 mL Oral Q8H PRN Michael Boston, MD      . diphenhydrAMINE (BENADRYL) 12.5 MG/5ML elixir 12.5 mg  12.5 mg Oral Q6H PRN Michael Boston, MD       Or  . diphenhydrAMINE (BENADRYL) injection 12.5 mg  12.5 mg Intravenous Q6H PRN Michael Boston, MD      . enalaprilat (VASOTEC) injection 0.625-1.25 mg  0.625-1.25 mg Intravenous Q6H PRN Michael Boston, MD      . ferrous sulfate tablet 325 mg  325 mg Oral TID WC Michael Boston, MD   325 mg at 02/01/16 1539  . HYDROmorphone (DILAUDID) injection 0.5-2 mg  0.5-2 mg Intravenous Q1H PRN Michael Boston, MD   0.5 mg at 01/30/16 2314  . ibuprofen (ADVIL,MOTRIN) tablet 400-800 mg  400-800 mg Oral Q6H PRN Michael Boston, MD   800 mg at 01/29/16 2213  . lip balm (CARMEX) ointment 1 application  1 application Topical BID Michael Boston, MD   1 application at 28/00/34 2138  . loperamide (IMODIUM) capsule 2 mg  2 mg Oral QHS Michael Boston, MD   2 mg at 02/01/16 2138  . loperamide (IMODIUM) capsule 2-4 mg  2-4 mg Oral Q8H PRN Michael Boston, MD   4 mg at 02/02/16 0416  . LORazepam (ATIVAN) tablet 0.5 mg  0.5 mg Oral QHS PRN Michael Boston, MD      . magic mouthwash  15 mL Oral QID PRN Michael Boston, MD      . menthol-cetylpyridinium (CEPACOL) lozenge 3 mg  1 lozenge Oral PRN Michael Boston, MD      . methocarbamol (ROBAXIN) 1,000 mg in dextrose 5 % 50 mL IVPB  1,000 mg Intravenous Q6H PRN Michael Boston, MD      . metoprolol (LOPRESSOR) injection 5 mg  5 mg Intravenous Q6H PRN Michael Boston, MD      . metoprolol tartrate (LOPRESSOR) tablet 12.5 mg  12.5 mg Oral Q12H PRN Michael Boston, MD      . oxymetazoline (AFRIN) 0.05 % nasal spray 1 spray  1 spray Each Nare BID PRN Michael Boston, MD      . pantoprazole (PROTONIX) EC tablet 40 mg  40 mg Oral Daily Michael Boston, MD   40 mg at 02/01/16 1037  . phenol (CHLORASEPTIC)  mouth spray 2 spray  2 spray Mouth/Throat PRN Michael Boston, MD      . potassium chloride SA (K-DUR,KLOR-CON) CR tablet 40 mEq  40 mEq Oral Daily Michael Boston, MD   40 mEq at 02/01/16 1037  . pravastatin (PRAVACHOL) tablet 20 mg  20 mg Oral q1800 Michael Boston, MD   20 mg at 02/01/16 1830  . prochlorperazine (COMPAZINE) injection 10 mg  10 mg Intravenous Q6H PRN Michael Boston, MD      . psyllium (HYDROCIL/METAMUCIL) packet 1 packet  1 packet Oral Daily Michael Boston, MD   1 packet at 02/01/16 1036  . simethicone (MYLICON) chewable tablet 80 mg  80 mg Oral Q6H PRN Michael Boston, MD      . traMADol Veatrice Bourbon) tablet 50-100 mg  50-100 mg Oral Q6H PRN Michael Boston, MD      . vitamin C (ASCORBIC ACID) tablet 500 mg  500 mg Oral BID Michael Boston, MD   500 mg at 02/01/16 2138  . zolpidem (AMBIEN) tablet 5 mg  5 mg Oral QHS PRN Michael Boston, MD   5 mg at 02/01/16 2335     No Known Allergies  Disposition: 01-Home or Self Care  Discharge Instructions    Call MD for:    Complete by:  As directed    Temperature > 101.19F   Call MD for:    Complete by:  As directed    Temperature > 101.19F   Call MD for:  extreme fatigue    Complete by:  As directed    Call MD for:  extreme fatigue    Complete by:  As directed    Call MD for:  hives    Complete by:  As directed    Call MD for:  hives    Complete by:  As directed    Call MD for:  persistant nausea and vomiting    Complete by:  As directed    Call MD for:  persistant nausea and vomiting    Complete by:  As directed    Call MD for:  redness, tenderness, or signs of infection (pain, swelling, redness, odor or green/yellow discharge around incision site)    Complete by:  As directed    Call MD for:  redness, tenderness, or signs of infection (pain, swelling, redness, odor or green/yellow discharge around incision site)    Complete by:  As directed    Call MD for:  severe uncontrolled pain    Complete by:  As directed    Call MD for:  severe uncontrolled  pain    Complete by:  As directed    Diet - low sodium heart healthy    Complete by:  As directed    Start with bland, low residue diet for a few days, then advance to a heart healthy (low fat, high fiber) diet.  If you feel nauseated or constipated, simplify to a liquid only diet for 48 hours until you are feeling better (no more nausea, farting/passing gas, having a bowel movement, etc...).  If you cannot tolerate even drinking liquids, or feeling worse, let your surgeon know or go to the Emergency Department for help.   Diet - low sodium heart healthy    Complete by:  As directed    Start with bland, low residue diet for a few days, then advance to a heart healthy (low fat, high fiber) diet.  If you feel nauseated or constipated, simplify to a liquid only diet for 48 hours until you are feeling better (no more nausea, farting/passing gas, having a bowel movement, etc...).  If you cannot tolerate even drinking liquids, or feeling worse, let your surgeon know or go to the Emergency Department for help.   Discharge instructions    Complete by:  As directed    Please see discharge instruction sheets.   Also refer to any handouts/printouts that may have been given from the [facility] surgery office (if you visited [place] there before surgery) Please call our office if you have any questions or concerns [phone]   Discharge instructions    Complete by:  As directed    Please see discharge instruction sheets.   Also refer to any handouts/printouts that may have been given from the [facility] surgery office (if you visited [place] there before surgery) Please call our office if you have any questions or concerns [phone]   Discharge wound care:    Complete by:  As directed    If you have closed incisions: Shower and bathe over these incisions with soap and water every day.  It is OK to wash over the dressings: they are waterproof. Remove all surgical  dressings on postoperative day #3.  You do not need to  replace dressings over the closed incisions unless you feel more comfortable with a Band-Aid covering it.   If you have an open wound: That requires packing, so please see wound care instructions.   In general, remove all dressings, wash wound with soap and water and then replace with saline moistened gauze.  Do the dressing change at least every day.    Please call our office [phone] if you have further questions.   Discharge wound care:    Complete by:  As directed    You have a closed incision with a wound vac, see wound vac care instructions.   The plan will be for you to come back to the office next week to remove the incisional wound VAC.   Please call our office [phone] if you have further questions.   Driving Restrictions    Complete by:  As directed    No driving until off narcotics and can safely swerve away without pain during an emergency   Driving Restrictions    Complete by:  As directed    No driving until off narcotics and can safely swerve away without pain during an emergency   Increase activity slowly    Complete by:  As directed    Increase activity slowly    Complete by:  As directed    Lifting restrictions    Complete by:  As directed    Avoid heavy lifting initially, <20 pounds at first.   Do not push through pain.   You have no specific weight limit: If it hurts to do, DON'T DO IT.    If you feel no pain, you are not injuring anything.  Pain will protect you from injury.   Coughing and sneezing are far more stressful to your incision than any lifting.   Avoid resuming heavy lifting (>50 pounds) or other intense activity until off all narcotic pain medications.   When want to exercise more, give yourself 2 weeks to gradually get back to full intense exercise/activity.   Lifting restrictions    Complete by:  As directed    Avoid heavy lifting initially, <20 pounds at first.   Do not push through pain.   You have no specific weight limit: If it  hurts to do, DON'T DO IT.    If you feel no pain, you are not injuring anything.  Pain will protect you from injury.   Coughing and sneezing are far more stressful to your incision than any lifting.   Avoid resuming heavy lifting (>50 pounds) or other intense activity until off all narcotic pain medications.   When want to exercise more, give yourself 2 weeks to gradually get back to full intense exercise/activity.   May shower / Bathe    Complete by:  As directed    [place].  It is fine for dressings or wounds to be washed/rinsed.  Use gentle soap & water.  This will help the incisions and/or wounds get clean & minimize infection.   May shower / Bathe    Complete by:  As directed    [place].  It is fine for dressings or wounds to be washed/rinsed.  Use gentle soap & water.  This will help the incisions and/or wounds get clean & minimize infection.   May walk up steps    Complete by:  As directed    May walk up steps  Complete by:  As directed    Sexual Activity Restrictions    Complete by:  As directed    Sexual activity as tolerated.  Do not push through pain.  Pain will protect you from injury.   Sexual Activity Restrictions    Complete by:  As directed    Sexual activity as tolerated.  Do not push through pain.  Pain will protect you from injury.   Walk with assistance    Complete by:  As directed    Walk over an hour a day.  May use a walker/cane/companion to help with balance and stamina.   Walk with assistance    Complete by:  As directed    Walk over an hour a day.  May use a walker/cane/companion to help with balance and stamina.      Follow-up Information    GROSS,STEVEN C., MD. Schedule an appointment as soon as possible for a visit in 2 weeks.   Specialty:  General Surgery Why:  To follow up after your operation, To follow up after your hospital stay, To have your drain removed & incisions re-checked Contact information: [address] [place] [address] [phone]            Signed: Olene Floss, [place]   02/02/2016, 6:58 AM

## 2016-02-02 NOTE — Discharge Instructions (Signed)
SURGERY: POST OP INSTRUCTIONS (Surgery for small bowel obstruction, colon resection, etc)   ######################################################################  EAT Gradually transition to a high fiber diet with a fiber supplement over the next few days after discharge  WALK Walk an hour a day.  Control your pain to do that.    CONTROL PAIN Control pain so that you can walk, sleep, tolerate sneezing/coughing, go up/down stairs.  HAVE A BOWEL MOVEMENT DAILY Keep your bowels regular to avoid problems.  OK to try a laxative to override constipation.  OK to use an antidairrheal to slow down diarrhea.  Call if not better after 2 tries  CALL IF YOU HAVE PROBLEMS/CONCERNS Call if you are still struggling despite following these instructions. Call if you have concerns not answered by these instructions  ######################################################################   DIET Follow a light diet the first few days at home.  Start with a bland diet such as soups, liquids, starchy foods, low fat foods, etc.  If you feel full, bloated, or constipated, stay on a ful liquid or pureed/blenderized diet for a few days until you feel better and no longer constipated. Be sure to drink plenty of fluids every day to avoid getting dehydrated (feeling dizzy, not urinating, etc.). Gradually add a fiber supplement to your diet over the next week.  Gradually get back to a regular solid diet.  Avoid fast food or heavy meals the first week as you are more likely to get nauseated. It is expected for your digestive tract to need a few months to get back to normal.  It is common for your bowel movements and stools to be irregular.  You will have occasional bloating and cramping that should eventually fade away.  Until you are eating solid food normally, off all pain medications, and back to regular activities; your bowels will not be normal. Focus on eating a low-fat, high fiber diet the rest of your life  (See Getting to [place], below).  CARE of your INCISION or WOUND It is good for closed incision and even open wounds to be washed every day.  Shower every day.  Short baths are fine.  Wash the incisions and wounds clean with soap & water.    You have an open wound with a wound vac, see wound vac care instructions.  The plan will be for you to come back to the office next week to remove the incisional wound VAC.     ACTIVITIES as tolerated Start light daily activities --- self-care, walking, climbing stairs-- beginning the day after surgery.  Gradually increase activities as tolerated.  Control your pain to be active.  Stop when you are tired.  Ideally, walk several times a day, eventually an hour a day.   Most people are back to most day-to-day activities in a few weeks.  It takes 4-8 weeks to get back to unrestricted, intense activity. If you can walk 30 minutes without difficulty, it is safe to try more intense activity such as jogging, treadmill, bicycling, low-impact aerobics, swimming, etc. Save the most intensive and strenuous activity for last (Usually 4-8 weeks after surgery) such as sit-ups, heavy lifting, contact sports, etc.  Refrain from any intense heavy lifting or straining until you are off narcotics for pain control.  You will have off days, but things should improve week-by-week. DO NOT PUSH THROUGH PAIN.  Let pain be your guide: If it hurts to do something, don't do it.  Pain is your body warning you to avoid that activity  for another week until the pain goes down. You may drive when you are no longer taking narcotic prescription pain medication, you can comfortably wear a seatbelt, and you can safely make sudden turns/stops to protect yourself without hesitating due to pain. You may have sexual intercourse when it is comfortable. If it hurts to do something, stop.  MEDICATIONS Take your usually prescribed home medications unless otherwise directed.   Blood thinners:   Usually you can restart any strong blood thinners after the second postoperative day.  It is OK to take aspirin right away.     If you are on strong blood thinners (warfarin/Coumadin, Plavix, Xerelto, Eliquis, Pradaxa, etc), discuss with your surgeon, medicine PCP, and/or cardiologist for instructions on when to restart the blood thinner & if blood monitoring is needed (PT/INR blood check, etc).     PAIN CONTROL Pain after surgery or related to activity is often due to strain/injury to muscle, tendon, nerves and/or incisions.  This pain is usually short-term and will improve in a few months.  To help speed the process of healing and to get back to regular activity more quickly, DO THE FOLLOWING THINGS TOGETHER: 1. Increase activity gradually.  DO NOT PUSH THROUGH PAIN 2. Use Ice and/or Heat 3. Try Gentle Massage and/or Stretching 4. Take over the counter pain medication 5. Take Narcotic prescription pain medication for more severe pain  Good pain control = faster recovery.  It is better to take more medicine to be more active than to stay in bed all day to avoid medications. 1.  Increase activity gradually Avoid heavy lifting at first, then increase to lifting as tolerated over the next 6 weeks. Do not push through the pain.  Listen to your body and avoid positions and maneuvers than reproduce the pain.  Wait a few days before trying something more intense Walking an hour a day is encouraged to help your body recover faster and more safely.  Start slowly and stop when getting sore.  If you can walk 30 minutes without stopping or pain, you can try more intense activity (running, jogging, aerobics, cycling, swimming, treadmill, sex, sports, weightlifting, etc.) Remember: If it hurts to do it, then dont do it! 2. Use Ice and/or Heat You will have swelling and bruising around the incisions.  This will take several weeks to resolve. Ice packs or heating pads (6-8 times a day, 30-60 minutes at a  time) will help sooth soreness & bruising. Some people prefer to use ice alone, heat alone, or alternate between ice & heat.  Experiment and see what works best for you.  Consider trying ice for the first few days to help decrease swelling and bruising; then, switch to heat to help relax sore spots and speed recovery. Shower every day.  Short baths are fine.  It feels good!  Keep the incisions and wounds clean with soap & water.   3. Try Gentle Massage and/or Stretching Massage at the area of pain many times a day Stop if you feel pain - do not overdo it 4. Take over the counter pain medication This helps the muscle and nerve tissues become less irritable and calm down faster Choose ONE of the following over-the-counter anti-inflammatory medications: Acetaminophen 500mg  tabs (Tylenol) 1-2 pills with every meal and just before bedtime (avoid if you have liver problems or if you have acetaminophen in you narcotic prescription) Naproxen 220mg  tabs (ex. Aleve, Naprosyn) 1-2 pills twice a day (avoid if you have kidney, stomach, IBD,  or bleeding problems) Ibuprofen 200mg  tabs (ex. Advil, Motrin) 3-4 pills with every meal and just before bedtime (avoid if you have kidney, stomach, IBD, or bleeding problems) Take with food/snack several times a day as directed for at least 2 weeks to help keep pain / soreness down & more manageable. 5. Take Narcotic prescription pain medication for more severe pain A prescription for strong pain control is often given to you upon discharge (for example: oxycodone/Percocet, hydrocodone/Norco/Vicodin, or tramadol/Ultram) Take your pain medication as prescribed. Be mindful that most narcotic prescriptions contain Tylenol (acetaminophen) as well - avoid taking too much Tylenol. If you are having problems/concerns with the prescription medicine (does not control pain, nausea, vomiting, rash, itching, etc.), please call us [phone] to see if we need to switch you to a  different pain medicine that will work better for you and/or control your side effects better. If you need a refill on your pain medication, you must call the office before 4 pm and on weekdays only.  By federal law, prescriptions for narcotics cannot be called into a pharmacy.  They must be filled out on paper & picked up from our office by the patient or authorized caretaker.  Prescriptions cannot be filled after 4 pm nor on weekends.    WHEN TO CALL us [phone] Severe uncontrolled or worsening pain  Fever over 101 F (38.5 C) Concerns with the incision: Worsening pain, redness, rash/hives, swelling, bleeding, or drainage Reactions / problems with new medications (itching, rash, hives, nausea, etc.) Nausea and/or vomiting Difficulty urinating Difficulty breathing Worsening fatigue, dizziness, lightheadedness, blurred vision Other concerns If you are not getting better after two weeks or are noticing you are getting worse, contact our office [phone] for further advice.  We may need to adjust your medications, re-evaluate you in the office, send you to the emergency room, or see what other things we can do to help. The clinic staff is available to answer your questions during regular business hours (8:30am-5pm).  Please dont hesitate to call and ask to speak to one of our nurses for clinical concerns.    A surgeon from [facility] is always on call at the hospitals 24 hours/day If you have a medical emergency, go to the nearest emergency room or call 911.  FOLLOW UP in our office One the day of your discharge from the hospital (or the next business weekday), please call [facility] to set up or confirm an appointment to see your surgeon in the office for a follow-up appointment.  Usually it is 2-3 weeks after your surgery.   If you have skin staples at your incision(s), let the office know so we can set up a time in the office for the nurse to remove them  (usually around 10 days after surgery). Make sure that you call for appointments the day of discharge (or the next business weekday) from the hospital to ensure a convenient appointment time. IF YOU HAVE DISABILITY OR FAMILY LEAVE FORMS, BRING THEM TO THE OFFICE FOR PROCESSING.  DO NOT GIVE THEM TO YOUR DOCTOR.  [facility] [address], [place], [place], [place]  [address] ? [phone] - Main [phone] - [place],  [phone] - Fax [email or web address]  GETTING TO GOOD BOWEL HEALTH. It is expected for your digestive tract to need a few months to get back to normal.  It is common for your bowel movements and stools to be irregular.  You  will have occasional bloating and cramping that should eventually fade away.  Until you are eating solid food normally, off all pain medications, and back to regular activities; your bowels will not be normal.   Avoiding constipation The goal: ONE SOFT BOWEL MOVEMENT A DAY!    Drink plenty of fluids.  Choose water first. TAKE A FIBER SUPPLEMENT EVERY DAY THE REST OF YOUR LIFE During your first week back home, gradually add back a fiber supplement every day Experiment which form you can tolerate.   There are many forms such as powders, tablets, wafers, gummies, etc Psyllium bran (Metamucil), methylcellulose (Citrucel), Miralax or Glycolax, Benefiber, Flax Seed.  Adjust the dose week-by-week (1/2 dose/day to 6 doses a day) until you are moving your bowels 1-2 times a day.  Cut back the dose or try a different fiber product if it is giving you problems such as diarrhea or bloating. Sometimes a laxative is needed to help jump-start bowels if constipated until the fiber supplement can help regulate your bowels.  If you are tolerating eating & you are farting, it is okay to try a gentle laxative such as double dose MiraLax, prune juice, or Milk of Magnesia.  Avoid using laxatives too often. Stool softeners can sometimes  help counteract the constipating effects of narcotic pain medicines.  It can also cause diarrhea, so avoid using for too long. If you are still constipated despite taking fiber daily, eating solids, and a few doses of laxatives, call our office.  Controlling diarrhea Try drinking liquids and eating bland foods for a few days to avoid stressing your intestines further. Avoid dairy products (especially milk & ice cream) for a short time.  The intestines often can lose the ability to digest lactose when stressed. Avoid foods that cause gassiness or bloating.  Typical foods include beans and other legumes, cabbage, broccoli, and dairy foods.  Avoid greasy, spicy, fast foods.  Every person has some sensitivity to other foods, so listen to your body and avoid those foods that trigger problems for you. Probiotics (such as active yogurt, Align, etc) may help repopulate the intestines and colon with normal bacteria and calm down a sensitive digestive tract Adding a fiber supplement gradually can help thicken stools by absorbing excess fluid and retrain the intestines to act more normally.  Slowly increase the dose over a few weeks.  Too much fiber too soon can backfire and cause cramping & bloating. Slow down diarrhea with a few doses of antidiarrheal medicines.   Bismuth subsalicylate (ex. Kayopectate, Pepto Bismol) for a few doses can help control diarrhea.  Avoid if pregnant.   Loperamide (Imodium) can slow down diarrhea.  Start with one tablet (2mg ) twice a day.  He may take up to two tablets every six hours as needed to slow down your bowel movements.  Avoid if you are having fevers or severe pain.   TROUBLESHOOTING IRREGULAR BOWELS 1) Start with a soft & bland diet. No spicy, greasy, or fried foods.  2) Avoid gluten/wheat or dairy products from diet to see if symptoms improve. 3) Miralax 17gm or flax seed mixed in [place]. water or juice-daily. May use 2-4 times a day as needed. 4) Gas-X, Phazyme, etc. as  needed for gas & bloating.  5) Prilosec (omeprazole) over-the-counter as needed 6)  Consider probiotics (Align, Activa, etc) to help calm the bowels down  Call your doctor if you are getting worse or not getting better.  Sometimes further testing (cultures, endoscopy, X-ray studies, CT  scans, bloodwork, etc.) may be needed to help diagnose and treat the cause of the diarrhea. [facility], [place], [place], [place], [place]  [address] [phone] - Main.    [phone]  - Toll Free.   [phone] - Fax [email or web address]  Pelvic floor muscle training exercises ("Kegels") can help strengthen the muscles under the uterus, bladder, and bowel (large intestine). They can help both men and women who have problems with urine leakage or bowel control.  A pelvic floor muscle training exercise is like pretending that you have to urinate, and then holding it. You relax and tighten the muscles that control urine flow. It's important to find the right muscles to tighten.  The next time you have to urinate, start to go and then stop. Feel the muscles in your vagina, bladder, or anus get tight and move up. These are the pelvic floor muscles. If you feel them tighten, you've done the exercise right. If you are still not sure whether you are tightening the right muscles, keep in mind that all of the muscles of the pelvic floor relax and contract at the same time. Because these muscles control the bladder, rectum, and vagina, the following tips may help: Women: Insert a finger into your vagina. Tighten the muscles as if you are holding in your urine, then let go. You should feel the muscles tighten and move up and down.  Men: Insert a finger into your rectum. Tighten the muscles as if you are holding in your urine, then let go. You should feel the muscles tighten and move up and down. These are the same muscles you would tighten if you were trying to prevent yourself  from passing gas.  It is very important that you keep the following muscles relaxed while doing pelvic floor muscle training exercises: Abdominal  Buttocks (the deeper, anal sphincter muscle should contract)  Thigh   A woman can also strengthen these muscles by using a vaginal cone, which is a weighted device that is inserted into the vagina. Then you try to tighten the pelvic floor muscles to hold the device in place. If you are unsure whether you are doing the pelvic floor muscle training correctly, you can use biofeedback and electrical stimulation to help find the correct muscle group to work. Biofeedback is a method of positive reinforcement. Electrodes are placed on the abdomen and along the anal area. Some therapists place a sensor in the vagina in women or anus in men to monitor the contraction of pelvic floor muscles.  A monitor will display a graph showing which muscles are contracting and which are at rest. The therapist can help find the right muscles for performing pelvic floor muscle training exercises.   PERFORMING PELVIC FLOOR EXERCISES: 1. Begin by emptying your bladder. 2. Tighten the pelvic floor muscles and hold for a count of 10. 3. Relax the muscles completely for a count of 10. 4. Do 10 repititions, 3 to 5 times a day (morning, afternoon, and night). You can do these exercises at any time and any place. Most people prefer to do the exercises while lying down or sitting in a chair. After 4 - 6 weeks, most people notice some improvement. It may take as long as 3 months to see a major change. After a couple of weeks, you can also try doing a single pelvic floor contraction at times when you are likely to leak (for example, while getting out of a chair). A  word of caution: Some people feel that they can speed up the progress by increasing the number of repetitions and the frequency of exercises. However, over-exercising can instead cause muscle fatigue and increase urine  leakage. If you feel any discomfort in your abdomen or back while doing these exercises, you are probably doing them wrong. Breathe deeply and relax your body when you are doing these exercises. Make sure you are not tightening your stomach, thigh, buttock, or chest muscles. When done the right way, pelvic floor muscle exercises have been shown to be very effective at improving urinary continence.  Pelvic Floor Pain / Incontinence  Do you suffer from pelvic pain or incontinence? Do you have pain in the pelvis, low back or hips that is associated with sitting, walking, urination or intercourse? Have you experienced leaking of urine or feces when coughing, sneezing or laughing? Do you have pain in the pelvic area associated with cancer?  These are conditions that are common with pelvic floor muscle dysfunction. Over time, due to stress, scar tissue, surgeries and the natural course of aging, our muscles may become weak or overstressed and can spasm. This can lead to pain, weakness, incontinence or decreased quality of life.  Men and women with pelvic floor dysfunction frequently describe:  A falling out feeling. Pain or burning in the abdomen, tailbone or perineal area. Constipation or bowel elimination problems or difficulty initiating urination. Unresolved low back or hip pain. Frequency and urgency when going to the bathroom. Leaking of urine or feces. Pain with intercourse.  [email or web address]  To make a referral or for more information about [facility] Pelvic Floor Therapy Program, call  [facility]) - [facility]) [phone]

## 2016-02-02 NOTE — Progress Notes (Signed)
Pt states she continues to have loose stools but does not save stools for nursing staff to document. Pad on bed was mildly soiled. Patient education completed and patient verbalized understanding. Pt given prn dose of imodium.

## 2016-02-09 ENCOUNTER — Other Ambulatory Visit: Payer: Self-pay

## 2016-02-09 NOTE — Patient Outreach (Signed)
[facility]) Care Management  02/09/16  Brianna Walker 1946-01-04 OC:[id number]  Subjective: Successful outreach completed with patient for transition of care. Patient identification verified. RNCM educated patient regarding program and patient agreeable to telephone outreach only at present.  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  Assessment: Patient stated she has been doing well since her discharge home. She stated that she is more optimistic than ever about her health. She stated that saw her surgeon, Dr. Michael Boston on 02/07/2016 and he removed her JP drain and wound vac and stated that she is healing well. No needs to continue with wound vac.   Patient currently denies any pain. Stated that she is still having some diarrhea, approximately 4-5 episodes per day and continues to take her anti-diarrhea tablets and her diarrhea is getting a little better. She reports that her bowel movements are less loose and are becoming more formed. She reports she is drinking fluids and staying hydrated. Patient denies any nausea since discharge home.  Patient lives at home with her husband and stated that her husband and her sister are her primary caregivers. They are currently helping with cooking food and she reports that she has plenty of help at home. She continued to state that they are helping her get to her appointments as she has not resumed driving yet. She reports that she has not started driving yet and is just "monitoring herself" before resuming. RNCM provided education per discharge instructions that she not be taking any narcotics and can safely swerve away without pain during  an emergency situation before resuming driving. Patient verbalized understanding.   Patient reports that she is trying to walk an hour every day per her doctor's instructions. She reported that prior to admission, she was walking approximately 30 minutes per day 3 days a week. She stated that she is increasing her activity slowly and is doing ok.  Per medication review, patient reports that she has been on Eliquis for quite some time to prevent blood clots.  She reports that is only taking her ferrous sulfate onece a day (instead of 3 times per day) due to it causing constipation She reports that she is currently off her Imatinib Mesylate until she follows up with Dr. Benay Spice Patient reports she has not been needing to take her Tramadol. She reports she has it in case she needs it for pain, but has not needed since discharge home.   Patient currently has no other questions or concerns. She is to follow up with her oncologist, Dr. Benay Spice next Tuesday 02/15/2016. She stated she has not scheduled an appointment with her PCP yet, but will once she has caught up with her surgeon and oncologist.  Plan: Continue outreach next week for transition of care. Complete risk assessment. Follow up re: resuming Imatinib MesylateVictoria R. Brailee Riede, RN, BSN, [place] Management Coordinator [phone]

## 2016-02-15 ENCOUNTER — Other Ambulatory Visit (HOSPITAL_BASED_OUTPATIENT_CLINIC_OR_DEPARTMENT_OTHER): Payer: Commercial Managed Care - HMO

## 2016-02-15 ENCOUNTER — Ambulatory Visit (HOSPITAL_BASED_OUTPATIENT_CLINIC_OR_DEPARTMENT_OTHER): Payer: Commercial Managed Care - HMO | Admitting: Oncology

## 2016-02-15 ENCOUNTER — Other Ambulatory Visit: Payer: Self-pay | Admitting: *Deleted

## 2016-02-15 ENCOUNTER — Telehealth: Payer: Self-pay | Admitting: Oncology

## 2016-02-15 VITALS — BP 112/76 | HR 104 | Temp 98.8°F | Resp 18 | Ht 64.0 in | Wt 183.7 lb

## 2016-02-15 DIAGNOSIS — C49A4 Gastrointestinal stromal tumor of large intestine: Secondary | ICD-10-CM | POA: Diagnosis not present

## 2016-02-15 DIAGNOSIS — Z23 Encounter for immunization: Secondary | ICD-10-CM

## 2016-02-15 LAB — CBC WITH DIFFERENTIAL/PLATELET
BASO%: 0.6 % (ref 0.0–2.0)
Basophils Absolute: 0.1 10e3/uL (ref 0.0–0.1)
EOS%: 0.7 % (ref 0.0–7.0)
Eosinophils Absolute: 0.1 10e3/uL (ref 0.0–0.5)
HCT: 27.3 % — ABNORMAL LOW (ref 34.8–46.6)
HGB: 8.9 g/dL — ABNORMAL LOW (ref 11.6–15.9)
LYMPH%: 15.1 % (ref 14.0–49.7)
MCH: 27.7 pg (ref 25.1–34.0)
MCHC: 32.7 g/dL (ref 31.5–36.0)
MCV: 84.5 fL (ref 79.5–101.0)
MONO#: 0.8 10e3/uL (ref 0.1–0.9)
MONO%: 6.5 % (ref 0.0–14.0)
NEUT#: 9.8 10e3/uL — ABNORMAL HIGH (ref 1.5–6.5)
NEUT%: 77.1 % — ABNORMAL HIGH (ref 38.4–76.8)
Platelets: 632 10e3/uL — ABNORMAL HIGH (ref 145–400)
RBC: 3.23 10e6/uL — ABNORMAL LOW (ref 3.70–5.45)
RDW: 15.6 % — ABNORMAL HIGH (ref 11.2–14.5)
WBC: 12.8 10e3/uL — ABNORMAL HIGH (ref 3.9–10.3)
lymph#: 1.9 10e3/uL (ref 0.9–3.3)

## 2016-02-15 LAB — COMPREHENSIVE METABOLIC PANEL
ALBUMIN: 2.8 g/dL — AB (ref 3.5–5.0)
ALK PHOS: 126 U/L (ref 40–150)
ALT: 8 U/L (ref 0–55)
ANION GAP: 12 meq/L — AB (ref 3–11)
AST: 16 U/L (ref 5–34)
BUN: 10.7 mg/dL (ref 7.0–26.0)
CALCIUM: 9.4 mg/dL (ref 8.4–10.4)
CHLORIDE: 106 meq/L (ref 98–109)
CO2: 24 mEq/L (ref 22–29)
Creatinine: 1.1 mg/dL (ref 0.6–1.1)
EGFR: 63 mL/min/{1.73_m2} — ABNORMAL LOW (ref >90)
Glucose: 88 mg/dl (ref 70–140)
Potassium: 3.6 mEq/L (ref 3.5–5.1)
Sodium: 142 mEq/L (ref 136–145)
Total Bilirubin: 0.67 mg/dL (ref 0.20–1.20)
Total Protein: 7.8 g/dL (ref 6.4–8.3)

## 2016-02-15 MED ORDER — INFLUENZA VAC SPLIT QUAD 0.5 ML IM SUSY
0.5000 mL | PREFILLED_SYRINGE | Freq: Once | INTRAMUSCULAR | Status: AC
  Administered 2016-02-15: 0.5 mL via INTRAMUSCULAR
  Filled 2016-02-15: qty 0.5

## 2016-02-15 MED ORDER — PRAVASTATIN SODIUM 20 MG PO TABS: ORAL_TABLET | ORAL | 2 refills | Status: DC

## 2016-02-15 NOTE — Telephone Encounter (Signed)
Gave patient avs report and appointments for November.  °

## 2016-02-15 NOTE — Progress Notes (Signed)
  [place] OFFICE PROGRESS NOTE   Diagnosis: Gastrointestinal stromal tumor  INTERVAL HISTORY:   [name] returns as scheduled. She underwent takedown of the ileostomy and excision of abdominal wall cystic mass by Dr. Johney Maine on 01/28/2016. The pathology on the cystic mass returned benign. She has not resumed Gleevec. She reports diarrhea following surgery. This has improved. The abdominal wounds have healed.   Objective:  Vital signs in last 24 hours:  Blood pressure 112/76, pulse (!) 104, temperature 98.8 F (37.1 C), temperature source Oral, resp. rate 18, height 5\' 4"  (1.626 m), weight 183 lb 11.2 oz (83.3 kg), SpO2 100 %.    HEENT: No thrush Resp: Lungs clear bilaterally Cardio: Regular rate and rhythm GI: No hepatomegaly, healed surgical incisions, no mass Vascular: No leg edema  Lab Results:  Lab Results  Component Value Date   WBC 12.8 (H) 02/15/2016   HGB 8.9 (L) 02/15/2016   HCT 27.3 (L) 02/15/2016   MCV 84.5 02/15/2016   PLT 632 (H) 02/15/2016   NEUTROABS 9.8 (H) 02/15/2016     Medications: I have reviewed the patient's current medications.  Assessment/Plan: 1. Gastrointestinal stromal tumor of the rectum  Status post an endoscopic ultrasound on 08/06/2014 confirming a mass abutting the distal rectal wall with an FNA biopsy confirming a gastrointestinal stromal tumor  Initiation of Gleevec 09/04/2014  MRI pelvis 12/15/2014 with interval decreased size of the large anorectal mass with central necrosis.  Continuation of Gleevec  Gleevec placed on hold 06/02/2015 in anticipation of surgery  06/04/2015 status post low anterior resection and diverting loop ileostomy, resection of tumor  Pathology: 9.5 cm tumor; GISTsubtype spindle; mitotic rate 1/50 high-power field; positive margin of resection  Adjuvant Gleevec 08/19/2015 (three-year in total course planned)  Held 01/28/2016 through 02/14/2016, resumed 02/15/2016 2. Remote history  of pulmonary embolism-maintained on apixaban 3. Multiple colon polyps noted on the colonoscopy 07/21/2014 with the pathology revealing tubular adenomas 4. Hospitalization 06/21/2015 through 07/06/2015 with nausea/vomiting, high ileostomy output; found to have delayed gastric emptying. 5. Water soluble contrast enema 08/13/2015 positive for a small to moderate volume of water soluble contrast leakage from the low anterior resection and anastomosis site in the pelvis 6. Ileostomy takedown and resection of cystic abdominal wall mass ( benign pathology)  on 01/28/2016     Disposition:  Brianna Walker appears well. She will resume Gleevec today. She will return for an office and lab visit in 4 weeks. She received an influenza vaccine today.  Betsy Coder, MD  02/15/2016  3:43 PM

## 2016-02-19 ENCOUNTER — Other Ambulatory Visit: Payer: Self-pay

## 2016-02-19 NOTE — Patient Outreach (Signed)
[name] [facility]) Care Management  02/19/16  Brianna Walker 1945-07-13 [id number]  Attempted to contact patient without success. Spoke with her husband [name] who stated tat she is currently not available. RNCM left HIPAA compliant voicemail with Wynetta Emery and provided [facility] contact information.  [place] R. Dakwan Pridgen, RN, BSN, [place] Management Coordinator [phone]

## 2016-02-25 ENCOUNTER — Other Ambulatory Visit: Payer: Self-pay

## 2016-02-25 NOTE — Patient Outreach (Signed)
[facility]) Care Management  02/25/16  DEISHA MOSBY 1945/05/14 [id number]  Attempted to reach patient without success. Left HIPAA compliant voicemail with patient's sister, providing RNCM contact information and invited callback.  [place] R. Morgann Woodburn, RN, BSN, [place] Management Coordinator [phone]

## 2016-02-29 ENCOUNTER — Other Ambulatory Visit: Payer: Self-pay | Admitting: Oncology

## 2016-03-01 MED FILL — GLEEVEC 400 MG TABLET: 400 | 30 days supply | Qty: 30 | Fill #0

## 2016-03-04 ENCOUNTER — Other Ambulatory Visit: Payer: Self-pay

## 2016-03-04 NOTE — Patient Outreach (Signed)
[place] [facility]) Care Management 03/04/16  Brianna Walker 1945/09/12 [id number]  Successful outreach completed with patient. Patient identification verified.   Patient stated that she has been doing pretty good. She denies pain at present. She reports that the only issue she had is just waiting for her bowels to return to normal, but her doctor reported it could take up to a year or two to return to normal. She stated that her incision is healing very well and she denies any signs or symptoms of infection.   She stated that she has been following up with her cancer doctor and goes there every 5 weeks. She reports that things are going well.  She declines any needs at present. Stated that she is doing really well and appreciates call to check in on her.  RNCM verified she still has contact information and encouraged her to call with any questions or concerns. Patient verbalized understanding.  Brianna R. Kevork Joyce, RN, BSN, [place] Management Coordinator [phone]

## 2016-03-10 ENCOUNTER — Ambulatory Visit (INDEPENDENT_AMBULATORY_CARE_PROVIDER_SITE_OTHER): Payer: Commercial Managed Care - HMO | Admitting: Internal Medicine

## 2016-03-10 ENCOUNTER — Encounter: Payer: Self-pay | Admitting: Internal Medicine

## 2016-03-10 VITALS — BP 122/82 | HR 107 | Ht 64.75 in | Wt 184.6 lb

## 2016-03-10 DIAGNOSIS — E041 Nontoxic single thyroid nodule: Secondary | ICD-10-CM | POA: Insufficient documentation

## 2016-03-10 DIAGNOSIS — E0520 Thyrotoxicosis with toxic multinodular goiter without thyrotoxic crisis or storm: Secondary | ICD-10-CM

## 2016-03-10 LAB — T4, FREE: FREE T4: 0.78 ng/dL (ref 0.60–1.60)

## 2016-03-10 LAB — T3, FREE: T3, Free: 2.9 pg/mL (ref 2.3–4.2)

## 2016-03-10 LAB — TSH: TSH: 0.14 u[IU]/mL — ABNORMAL LOW (ref 0.35–4.50)

## 2016-03-10 NOTE — Progress Notes (Signed)
Patient ID: Brianna Walker, female   DOB: 07-Jan-1946, 70 y.o.   MRN: [id number]   HPI  Brianna Walker is a 70 y.o.-year-old female, referred by her PCP, Dr. Jenny Reichmann, for evaluation for toxic multinodular goiter. Last visit 4 mo ago.  She had an ileostomy (originally done for colon cancer) reversed in 12/2015. She continues on Gleevec.   I reviewed pt's thyroid tests: Lab Results  Component Value Date   TSH 0.23 (L) 11/08/2015   TSH 1.27 09/14/2015   TSH 0.102 (L) 06/21/2015   TSH 0.21 (L) 02/11/2014   TSH 0.26 (L) 01/22/2013   TSH 0.57 08/12/2012   TSH 0.35 05/28/2012   TSH 0.32 (L) 12/05/2011   TSH 0.51 09/16/2009   TSH 0.51 09/10/2008   FREET4 0.82 11/08/2015   FREET4 1.35 (H) 06/21/2015   FREET4 0.82 03/05/2014  01/07/2010: TSH 0.64, Free T4 0.93 Of note, the labs in 06/2015 were obtained after CT with contrast.  12/08/2015: Thyroid Uptake and scan: Borderline low 24 hour radio iodine uptake of 10%.  Multinodular thyroid gland with question cold nodule versus suppressed normal thyroid tissue at the inferior pole of the RIGHT lobe; thyroid ultrasound recommended to exclude cold nodule/mass at inferior pole RIGHT thyroid lobe.    12/16/2015:Thyroid ultrasound: 2.1 cm R Dominant thyroid nodule corresponding to the photopenic defect: Right thyroid lobe: 5.2 x 2.2 x 2.5 cm. Heterogeneous echotexture.   1.3 x 1.1 x 1.2 cm hypoechoic nodule, inferior pole.   Adjacent 2.1 x 1.6 x 1.8 cm solid nodule, inferior pole, corresponding to cold defect on scintigraphy.   Additional smaller nodules in the mid lobe measuring 1 cm or less.  Left thyroid lobe: 5.1 x 2.4 x 2.8 cm. Heterogeneous echotexture.   2.4 x 1.3 x 1.8 cm hypoechoic nodule, deep mid lobe.   1.8 x 1.4 x 1.5 cm mostly solid nodule more laterally. 1.9 x 1.1 x 1.7 cm solid nodule,  inferior pole.   Additional smaller nodules and cysts less than 1 cm.  Isthmus Thickness: 0.6 cm.  No nodules  visualized.  Lymphadenopathy: None visualized.  Pt denies feeling nodules in neck, hoarseness, dysphagia/odynophagia, SOB with lying down; she c/o: - No fatigue - + weight loss (still losing weight) - + Hot flushes - + Diarrhea - no tremors - no anxiety - no palpitations - No hair loss  Pt does not have a FH of thyroid ds. No FH of thyroid cancer. No h/o radiation tx to head or neck.  No seaweed or kelp, no recent contrast studies. No steroid use. No herbal supplements. No Biotin use.  Pt. also has a history of rectal GIST dx in 2016, for which she is seeing Dr. Benay Spice, and is on [place].  She also has a history of pulmonary embolism. Per review of her most recent MRI of the brain in 07/2015, she has a partially empty sella.  ROS: Constitutional: See history of preent illness, + nocturia Eyes: no blurry vision, no xerophthalmia ENT: no sore throat, no nodules palpated in throat, no dysphagia/odynophagia, no hoarseness Cardiovascular: no CP/SOB/palpitations/leg swelling Respiratory: no cough/no SOB Gastrointestinal: no N/V/+ D/no C/heartburn Musculoskeletal: no muscle/joint aches Skin: no rashes, no hair loss Neurological: no tremors/numbness/tingling/dizziness  I reviewed pt's medications, allergies, PMH, social hx, family hx, and changes were documented in the history of present illness. Otherwise, unchanged from my initial visit note.  Past Medical History:  Diagnosis Date  . AKI (acute kidney injury) ([place]) 06/21/2015  . ALLERGIC RHINITIS 09/12/2007  .  Arthritis   . BACK PAIN 06/19/2008  . Cancer ([place])    dx. 4'16 -oral chemotherapy only.  . Colon polyps   . Cramp of limb 08/11/2009  . DEEP VENOUS THROMBOPHLEBITIS, LEG, RIGHT 03/04/2010   right leg  . Diverticulosis   . FREQUENCY, URINARY 10/07/2009  . GERD 12/05/2006  . HEMORRHOIDS 11/17/2006  . HIP PAIN, RIGHT, CHRONIC 08/11/2009  . HX, PERSONAL, TUBERCULOSIS 11/17/2006  . HYPERLIPIDEMIA 09/12/2007  . Hyperlipidemia  09/12/2007   Qualifier: Diagnosis of  By: Jenny Reichmann MD, [place]   . HYPERTENSION 11/17/2006  . INSOMNIA-SLEEP DISORDER-UNSPEC 06/19/2008  . Iron deficiency anemia 12/07/2011  . LEG PAIN, RIGHT 06/19/2008  . Long term (current) use of anticoagulants 11/29/2010  . MASS, SUPERFICIAL 09/12/2007  . MENOPAUSAL DISORDER 09/20/2009  . OVERACTIVE BLADDER 03/04/2010  . Perforation of colon ([place]) 11/17/2015  . Pneumonia   . PULMONARY EMBOLISM, HX OF 11/17/2006   [place] [facility] s/p Pneumonia  . RECTAL BLEEDING 10/07/2007  . Rectal mass 06/2014  . SCIATICA, RIGHT 09/12/2007  . Shortness of breath dyspnea    With exertion since chemotherapy tx-oral meds  . SKIN LESION 07/20/2009  . TMJ SYNDROME 11/17/2006  . Tuberculosis    pt. was tx.   Past Surgical History:  Procedure Laterality Date  . ABDOMINAL HYSTERECTOMY    . CHOLECYSTECTOMY OPEN    . EUS N/A 08/06/2014   Procedure: LOWER ENDOSCOPIC ULTRASOUND (EUS);  Surgeon: Milus Banister, MD;  Location: [facility];  Service: Endoscopy;  Laterality: N/A;  . EXCISION MASS ABDOMINAL N/A 01/28/2016   Procedure: EXCISION  ABDOMINAL WALL MASS, CYST, QUESTION FAT NECROSIS, QUESTION SEROMA;  Surgeon: Michael Boston, MD;  Location: [facility];  Service: General;  Laterality: N/A;  . hemorroid surgery  removed at dr Silvio Pate office   x 2  . ILEOSTOMY CLOSURE N/A 01/28/2016   Procedure: TAKEDOWN OF LOOP ILEOSTOMY;  Surgeon: Michael Boston, MD;  Location: [facility];  Service: General;  Laterality: N/A;  . [place]  2002     multiple ventral hernia repair/mesh  . INCISIONAL HERNIA REPAIR  01/13/2005   open w onlay Proceed mesh.  Marland Kitchen LAPAROSCOPIC LYSIS OF ADHESIONS  06/04/2015   Procedure: LAPAROSCOPIC LYSIS OF ADHESIONS;  Surgeon: Michael Boston, MD;  Location: [facility];  Service: General;;  . PROCTOSCOPY N/A 01/28/2016   Procedure: RIGID PROCTOSCOPY, DILATION OF COLORECTAL ANASTOMOTIC STRICTURE;  Surgeon: Michael Boston, MD;  Location: [facility];  Service: General;   Laterality: N/A;  . s/p right hip replaced min invasive hip surgury [facility] oct 2011 Right 2011  . tumor removed off of right shoulder Right   . XI ROBOTIC ASSISTED LOWER ANTERIOR RESECTION N/A 06/04/2015   Procedure: XI ROBOTIC ASSISTED LYSIS OF ADHESIONS, LOWER ANTERIOR RESECTION TRANS ABDOMINAL AND TRANS ANAL, COLOANAL HANDSEWN ANASTAOSIS, DIVERTING LOPE ILIOSTOMY, RESECTION GIST TUMOR;  Surgeon: Michael Boston, MD;  Location: [facility];  Service: General;  Laterality: N/A;   Social History   Social History  . Marital status: Married    Spouse name: N/A  . Number of children: 2  . Years of education: N/A   Occupational History  . retired    Social History Main Topics  . Smoking status: Former Smoker    Packs/day: 0.00    Types: Cigarettes    Quit date: 06/09/1977  . Smokeless tobacco: Never Used  . Alcohol use 0.0 oz/week     Comment: rare  . Drug use: No     Comment: past  marijuana  . Sexual activity: Not on file   Other Topics Concern  . Not on file   Social History Narrative   Husband civil Chief Financial Officer [place]   Has #2 children (one son at home with her)   Prior employment-various customer service positions   Current Outpatient Prescriptions on File Prior to Visit  Medication Sig Dispense Refill  . acetaminophen (TYLENOL) 500 MG tablet Take 1,000 mg by mouth every 6 (six) hours as needed for moderate pain or headache.    Marland Kitchen aspirin 81 MG tablet Take 81 mg by mouth at bedtime.     . benazepril (LOTENSIN) 40 MG tablet TAKE 1 TABLET EVERY EVENING 90 tablet 3  . ELIQUIS 5 MG TABS tablet TAKE 1 TABLET TWICE DAILY 180 tablet 3  . estradiol (VIVELLE-DOT) 0.025 MG/24HR APPLY 1 PATCH ONTO SKIN EVERY SUNDAY. 16 patch 2  . ferrous sulfate 325 (65 FE) MG tablet Take 1 tablet (325 mg total) by mouth daily. 180 tablet 3  . GLEEVEC 400 MG tablet TAKE 1 TABLET BY MOUTH ONCE DAILY WITH MEALS AND LARGE GLASS OF WATER 30 tablet 2  . loperamide (IMODIUM) 2 MG capsule Take 1-2 capsules (2-4 mg  total) by mouth every 8 (eight) hours as needed for diarrhea or loose stools (Use if >2 BM every 8 hours). 30 capsule 0  . LORazepam (ATIVAN) 0.5 MG tablet Take 1 tablet (0.5 mg total) by mouth at bedtime as needed for anxiety. 30 tablet 0  . omeprazole (PRILOSEC) 20 MG capsule TAKE 1 CAPSULE EVERY EVENING 90 capsule 3  . potassium chloride SA (K-DUR,KLOR-CON) 20 MEQ tablet Take 2 tablets (40 mEq total) by mouth 2 (two) times daily. 6 tablet 0  . pravastatin (PRAVACHOL) 20 MG tablet Take 1 tablet by mouth at bedtime 90 tablet 2  . Propylhexedrine (BENZEDREX) INHA Place 1 each into the nose daily as needed (congestion).    . psyllium (HYDROCIL/METAMUCIL) 95 % PACK Take 1 packet by mouth daily. 56 each 0  . simethicone (MYLICON) 80 MG chewable tablet Chew 1 tablet (80 mg total) by mouth every 6 (six) hours as needed for flatulence. 30 tablet 0  . traMADol (ULTRAM) 50 MG tablet Take 1-2 tablets (50-100 mg total) by mouth every 6 (six) hours as needed for moderate pain. 40 tablet 1   No current facility-administered medications on file prior to visit.    No Known Allergies Family History  Problem Relation Age of Onset  . Colon cancer Father   . Heart disease Mother   . Heart disease Sister   . Diabetes Sister    PE: BP 122/82 (BP Location: Left Arm, Patient Position: Sitting, Cuff Size: Normal)   Pulse (!) 107   Ht 5' 4.75" (1.645 m)   Wt 184 lb 9.6 oz (83.7 kg)   SpO2 98%   BMI 30.96 kg/m  Body mass index is 30.96 kg/m. Wt Readings from Last 3 Encounters:  03/10/16 184 lb 9.6 oz (83.7 kg)  02/15/16 183 lb 11.2 oz (83.3 kg)  02/01/16 193 lb 12.6 oz (87.9 kg)   Constitutional: overweight, in NAD Eyes: PERRLA, EOMI, no exophthalmos ENT: moist mucous membranes, no thyromegaly, no cervical lymphadenopathy Cardiovascular: RRR, No MRG Respiratory: CTA B Gastrointestinal: abdomen soft, NT, ND, BS+ Musculoskeletal: no deformities, strength intact in all 4 Skin: moist, warm, no  rashes Neurological: no tremor with outstretched hands, DTR normal in all 4  ASSESSMENT: 1. Toxic multinodular goiter - fluctuating TFT  - Per my research of  the literature, Gleevec (imatinib) is known to be able to cause thyroid dysfunction, but usually in the form of hypothyroidism. She also has a history of partially empty sella, but I doubt central hypothyroidism, since she had one instance of elevated free T4 in the past pointing towards thyrotoxicosis.  2. Cold R thyroid nodule   PLAN:  1. Patient with a h/o fluctuating TFT in the past, with last TSH level mildly abnormal. Pt  has weight loss after her GIST sx ,  and now has diarrhea after the reversal of the ileostomy. She also complains of hot flashes and her heart rate is high.  - will recheck TSH, fT3 and fT4 today. She may need a beta blocker if these are thyrotoxic. - possible modalities of treatment for the above conditions, to include methimazole use, radioactive iodine ablation or (last resort) surgery. - RTC in 6 months, but possibly sooner for repeat labs  2. Cold thyroid nodule  - Patient has a multinodular goiter, with mostly toxic nodules except for a right 2.1 cm thyroid nodule  -  I suggested biopsy of this nodule >> she agrees  - I explained the biopsy procedure and given a return material about it  Component     Latest Ref Rng & Units 03/10/2016  TSH     0.35 - 4.50 uIU/mL 0.14 (L)  T4,Free(Direct)     0.60 - 1.60 ng/dL 0.78  Triiodothyronine,Free,Serum     2.3 - 4.2 pg/mL 2.9   I will addend the results of the Bx when they become available.  Mildly low TSH >> Because of the weight loss, hot flashes, and tachycardia, I would suggest RAI treatment, after her biopsy. We'll discuss with the patient.   Philemon Kingdom, MD PhD [facility]

## 2016-03-10 NOTE — Patient Instructions (Signed)
Please stop at the lab.  Please come back for a follow-up appointment in 6 months   Thyroid Biopsy The thyroid gland is a butterfly-shaped gland located in the front of the neck. It produces hormones that affect metabolism, growth and development, and body temperature. Thyroid biopsy is a procedure in which small samples of tissue or fluid are removed from the thyroid gland. The samples are then looked at under a microscope to check for abnormalities. This procedure is done to determine the cause of thyroid problems. It may be done to check for infection, cancer, or other thyroid problems. Two methods may be used for a thyroid biopsy. In one method, a thin needle is inserted through the skin and into the thyroid gland. In the other method, an open incision is made through the skin. LET [facility] CARE PROVIDER KNOW ABOUT:   Any allergies you have.  All medicines you are taking, including vitamins, herbs, eye drops, creams, and over-the-counter medicines.  Previous problems you or members of your family have had with the use of anesthetics.  Any blood disorders you have.  Previous surgeries you have had.  Medical conditions you have. RISKS AND COMPLICATIONS Generally, this is a safe procedure. However, problems can occur and include:  Bleeding from the procedure site.  Infection.  Injury to structures near the thyroid gland. BEFORE THE PROCEDURE   Ask your health care provider about:  Changing or stopping your regular medicines. This is especially important if you are taking diabetes medicines or blood thinners.  Taking medicines such as aspirin and ibuprofen. These medicines can thin your blood. Do not take these medicines before your procedure if your health care provider asks you not to.  Do not eat or drink anything after midnight on the night before the procedure or as directed by your health care provider.  You may have a blood sample taken. PROCEDURE Either of these  methods may be used to perform a thyroid biopsy:  Fine needle biopsy. You may be given medicine to help you relax (sedative). You will be asked to lie on your back with your head tipped backward to extend your neck. An area on your neck will be cleaned. A needle will then be inserted through the skin of your neck. You may be asked to avoid coughing, talking, swallowing, or making sounds during some portions of the procedure. The needle will be withdrawn once the tissue or fluid samples have been removed. Pressure may be applied to your neck to reduce swelling and ensure that bleeding has stopped. The samples will be sent to a lab for examination.  Open biopsy. You will be given medicine to make you sleep (general anesthetic). An incision will be made in your neck. A sample of thyroid tissue will be removed using surgical tools. The tissue sample will be sent for examination. In some cases, the sample may be examined during the biopsy. If that is done and cancer cells are found, some or all of the thyroid gland may be removed. The incision will be closed with stitches. AFTER THE PROCEDURE   Your recovery will be assessed and monitored.  You may have soreness and tenderness at the site of the biopsy. This should go away after a few days.  If you had an open biopsy, you may have a hoarse voice or sore throat for a couple days.  It is your responsibility to get your test results.   This information is not intended to replace advice given  to you by your health care provider. Make sure you discuss any questions you have with your health care provider.   Document Released: 02/12/2007 Document Revised: 05/08/2014 Document Reviewed: 07/10/2013 Elsevier Interactive Patient Education 2016 Elsevier Inc.   Thyroid Biopsy, Care After Refer to this sheet in the next few weeks. These instructions provide you with information on caring for yourself after your procedure. Your health care provider may also give  you more specific instructions. Your treatment has been planned according to current medical practices, but problems sometimes occur. Call your health care provider if you have any problems or questions after your procedure.  WHAT TO EXPECT AFTER THE PROCEDURE After your procedure, it is typical to have the following:  You may have soreness and tenderness at the biopsy site for a few days.  You may have a sore throat or a hoarse voice if you had an open biopsy. This should go away after a couple days. HOME CARE INSTRUCTIONS  Take medicines only as directed by your health care provider.  To ease discomfort at the biopsy site:  Keep your head raised on a pillow when you are lying down.  Support the back of your head and neck with both hands as you sit up from a lying position.   If you have a sore throat, try using throat lozenges or gargling with warm salt water.   Keep all follow-up visits as directed by your health care provider. This is important. SEEK MEDICAL CARE IF:  You have a fever. SEEK IMMEDIATE MEDICAL CARE IF:  You have severe bleeding from the biopsy site.   You have difficulty swallowing.   You have drainage, redness, swelling, or pain at the biopsy site.   You have swollen glands (lymph nodes) in your neck.    This information is not intended to replace advice given to you by your health care provider. Make sure you discuss any questions you have with your health care provider.   Document Released: 11/12/2013 Document Reviewed: 11/12/2013 Elsevier Interactive Patient Education [employer].

## 2016-03-10 NOTE — Telephone Encounter (Signed)
This encounter was created in error - please disregard.

## 2016-03-13 ENCOUNTER — Other Ambulatory Visit: Payer: Self-pay | Admitting: Internal Medicine

## 2016-03-14 ENCOUNTER — Ambulatory Visit (HOSPITAL_BASED_OUTPATIENT_CLINIC_OR_DEPARTMENT_OTHER): Payer: Commercial Managed Care - HMO | Admitting: Nurse Practitioner

## 2016-03-14 ENCOUNTER — Other Ambulatory Visit (HOSPITAL_BASED_OUTPATIENT_CLINIC_OR_DEPARTMENT_OTHER): Payer: Commercial Managed Care - HMO

## 2016-03-14 ENCOUNTER — Telehealth: Payer: Self-pay | Admitting: Oncology

## 2016-03-14 ENCOUNTER — Ambulatory Visit: Payer: Commercial Managed Care - HMO

## 2016-03-14 VITALS — BP 130/84 | HR 84 | Temp 98.0°F | Resp 18 | Ht 64.75 in | Wt 183.1 lb

## 2016-03-14 DIAGNOSIS — C49A4 Gastrointestinal stromal tumor of large intestine: Secondary | ICD-10-CM

## 2016-03-14 LAB — COMPREHENSIVE METABOLIC PANEL
ALK PHOS: 118 U/L (ref 40–150)
ALT: 6 U/L (ref 0–55)
AST: 11 U/L (ref 5–34)
Albumin: 2.9 g/dL — ABNORMAL LOW (ref 3.5–5.0)
Anion Gap: 10 mEq/L (ref 3–11)
BUN: 12.3 mg/dL (ref 7.0–26.0)
CALCIUM: 9 mg/dL (ref 8.4–10.4)
CHLORIDE: 110 meq/L — AB (ref 98–109)
CO2: 25 mEq/L (ref 22–29)
Creatinine: 1.1 mg/dL (ref 0.6–1.1)
EGFR: 60 mL/min/{1.73_m2} — AB (ref >90)
Glucose: 109 mg/dl (ref 70–140)
POTASSIUM: 3.3 meq/L — AB (ref 3.5–5.1)
SODIUM: 145 meq/L (ref 136–145)
Total Bilirubin: 0.64 mg/dL (ref 0.20–1.20)
Total Protein: 7.3 g/dL (ref 6.4–8.3)

## 2016-03-14 LAB — CBC WITH DIFFERENTIAL/PLATELET
BASO%: 0.3 % (ref 0.0–2.0)
BASOS ABS: 0 10*3/uL (ref 0.0–0.1)
EOS ABS: 0.1 10*3/uL (ref 0.0–0.5)
EOS%: 0.7 % (ref 0.0–7.0)
HEMATOCRIT: 29 % — AB (ref 34.8–46.6)
HGB: 9.2 g/dL — ABNORMAL LOW (ref 11.6–15.9)
LYMPH%: 19.1 % (ref 14.0–49.7)
MCH: 26.7 pg (ref 25.1–34.0)
MCHC: 31.7 g/dL (ref 31.5–36.0)
MCV: 84.3 fL (ref 79.5–101.0)
MONO#: 0.4 10*3/uL (ref 0.1–0.9)
MONO%: 5 % (ref 0.0–14.0)
NEUT#: 5.5 10*3/uL (ref 1.5–6.5)
NEUT%: 74.9 % (ref 38.4–76.8)
Platelets: 347 10*3/uL (ref 145–400)
RBC: 3.44 10*6/uL — ABNORMAL LOW (ref 3.70–5.45)
RDW: 16.4 % — ABNORMAL HIGH (ref 11.2–14.5)
WBC: 7.3 10*3/uL (ref 3.9–10.3)
lymph#: 1.4 10*3/uL (ref 0.9–3.3)

## 2016-03-14 NOTE — Telephone Encounter (Signed)
GAVE PATIENT AVS REPORT AND APPOINTMENT FOR DECEMBER

## 2016-03-14 NOTE — Progress Notes (Signed)
  [place] OFFICE PROGRESS NOTE   Diagnosis:  Gastrointestinal stromal tumor  INTERVAL HISTORY:   [name] returns as scheduled. She continues Gleevec. She has occasional nausea. No vomiting. No mouth sores. No diarrhea. Appetite varies.  Objective:  Vital signs in last 24 hours:  Blood pressure 130/84, pulse 84, temperature 98 F (36.7 C), temperature source Oral, resp. rate 18, height 5' 4.75" (1.645 m), weight 183 lb 1.6 oz (83.1 kg), SpO2 100 %.    HEENT: No thrush or ulcers. Resp: Lungs clear bilaterally. Cardio: Regular rate and rhythm. GI: Abdomen soft and nontender. No hepatomegaly. Healed surgical incisions. Vascular: No leg edema.   Lab Results:  Lab Results  Component Value Date   WBC 12.8 (H) 02/15/2016   HGB 8.9 (L) 02/15/2016   HCT 27.3 (L) 02/15/2016   MCV 84.5 02/15/2016   PLT 632 (H) 02/15/2016   NEUTROABS 9.8 (H) 02/15/2016    Imaging:  No results found.  Medications: I have reviewed the patient's current medications.  Assessment/Plan: 1. Gastrointestinal stromal tumor of the rectum  Status post an endoscopic ultrasound on 08/06/2014 confirming a mass abutting the distal rectal wall with an FNA biopsy confirming a gastrointestinal stromal tumor  Initiation of Gleevec 09/04/2014  MRI pelvis 12/15/2014 with interval decreased size of the large anorectal mass with central necrosis.  Continuation of Gleevec  Gleevec placed on hold 06/02/2015 in anticipation of surgery  06/04/2015 status post low anterior resection and diverting loop ileostomy, resection of tumor  Pathology: 9.5 cm tumor; GISTsubtype spindle; mitotic rate 1/50 high-power field; positive margin of resection  Adjuvant Gleevec 08/19/2015 (three-year in total course planned)  Held 01/28/2016 through 02/14/2016, resumed 02/15/2016 2. Remote history of pulmonary embolism-maintained on apixaban 3. Multiple colon polyps noted on the colonoscopy 07/21/2014 with  the pathology revealing tubular adenomas 4. Hospitalization 06/21/2015 through 07/06/2015 with nausea/vomiting, high ileostomy output; found to have delayed gastric emptying. 5. Water soluble contrast enema 08/13/2015 positive for a small to moderate volume of water soluble contrast leakage from the low anterior resection and anastomosis site in the pelvis 6. Ileostomy takedown and resection of cystic abdominal wall mass ( benign pathology)  on 01/28/2016   Disposition: Brianna Walker appears stable. She will continue Gleevec. We scheduled a return visit in one month. She will contact the office in the interim with any problems.    Ned Card ANP/GNP-BC   03/14/2016  2:55 PM

## 2016-03-16 ENCOUNTER — Telehealth: Payer: Self-pay | Admitting: Emergency Medicine

## 2016-03-16 ENCOUNTER — Telehealth: Payer: Self-pay

## 2016-03-16 MED ORDER — POTASSIUM CHLORIDE CRYS ER 20 MEQ PO TBCR: 20.0000 meq | EXTENDED_RELEASE_TABLET | Freq: Every day | ORAL | 0 refills | Status: DC

## 2016-03-16 NOTE — Telephone Encounter (Signed)
Called patient to inform her that her potassium was low, inquired if pt was taking oral potassium at home. States she ran out in October. Informed her she had 2 refills on the one sent to [facility]. Pt requested an order be sent to local pharmacy- [facility]. Prescription sent. Pt denies any questions or concerns at this time.

## 2016-03-16 NOTE — Telephone Encounter (Signed)
-----   Message from Owens Shark, NP sent at 03/16/2016  9:24 AM EST ----- Please let her know potassium level is low and confirm whether or not she is currently taking potassium. If not she needs to begin [place] 20 meq daily. Thanks

## 2016-03-16 NOTE — Telephone Encounter (Signed)
[facility] called and wants to know if they can get a letter stating they need the patient to stop her Eliquis 48 hours prior to the thyroid biopsy.  Fax # is [phone]. Please advise thanks.

## 2016-03-17 NOTE — Telephone Encounter (Signed)
[place] for this, and restart next day post biopsy

## 2016-03-28 MED FILL — GLEEVEC 400 MG TABLET: 400 | 30 days supply | Qty: 30 | Fill #1

## 2016-04-06 ENCOUNTER — Other Ambulatory Visit: Payer: Self-pay | Admitting: *Deleted

## 2016-04-06 NOTE — Patient Outreach (Signed)
[facility]) Care Management  04/06/2016  TISHIE PLIEGO [birth year] [id number]  COVERAGE FOR VICTORIA [place]TER[place]-REFERRAL RECEIVED 12/5 RN spoke with pt today and introduced the [facility] program and services once again. Informed pt that this Rn was following up on her ongoing needs based upon previous discussions with the prior RN case manager Tish Men). Pt receptive to the call and indicated she was not doing as well this month with issues as followed: not feeling well, not eating well and over feeling bad. Theses problems have been in affect for awhile but pt has not reported to her provider. Pt states she has a follow up 5 week follow up appointment pending on next Tuesday. RN strongly encouraged pt to contact her provider's office on tomorrow and report all her symptoms just in case her provider wishes to run test or scans prior to her upcoming appointment on Tuesday. RN also encouraged if her provider can see her sooner if her problems are getting worse. Pt aware to visit the urgent care or ED if symptoms get worse. RN offered to follow up on Monday and inquire further on the possible interventions. Pt very receptive and appreciative for the call today. Will scheduled a telephone follow up call.  Raina Mina, RN Care Management Coordinator [place] Office [phone]

## 2016-04-10 ENCOUNTER — Other Ambulatory Visit: Payer: Self-pay | Admitting: *Deleted

## 2016-04-10 NOTE — Patient Outreach (Addendum)
[facility]) Care Management  04/10/2016  Brianna Walker 1945/10/26 [id number]   RN attempted a follow up call today however unsuccessful. RN able to leave a HIPAA approved voice message requesting a call back. Will inquire further at that time on pt's progress and needs. Will rescheduled follow up call this week.  Raina Mina, RN Care Management Coordinator [place] Office [phone]

## 2016-04-11 ENCOUNTER — Telehealth: Payer: Self-pay | Admitting: Oncology

## 2016-04-11 ENCOUNTER — Ambulatory Visit (HOSPITAL_BASED_OUTPATIENT_CLINIC_OR_DEPARTMENT_OTHER): Payer: Commercial Managed Care - HMO | Admitting: Nurse Practitioner

## 2016-04-11 ENCOUNTER — Other Ambulatory Visit (HOSPITAL_BASED_OUTPATIENT_CLINIC_OR_DEPARTMENT_OTHER): Payer: Commercial Managed Care - HMO

## 2016-04-11 VITALS — HR 92 | Temp 97.9°F | Resp 18 | Ht 64.75 in | Wt 179.1 lb

## 2016-04-11 DIAGNOSIS — C49A4 Gastrointestinal stromal tumor of large intestine: Secondary | ICD-10-CM | POA: Diagnosis not present

## 2016-04-11 DIAGNOSIS — R110 Nausea: Secondary | ICD-10-CM

## 2016-04-11 DIAGNOSIS — R630 Anorexia: Secondary | ICD-10-CM

## 2016-04-11 LAB — COMPREHENSIVE METABOLIC PANEL
ALT: 7 U/L (ref 0–55)
AST: 15 U/L (ref 5–34)
Albumin: 3 g/dL — ABNORMAL LOW (ref 3.5–5.0)
Alkaline Phosphatase: 111 U/L (ref 40–150)
Anion Gap: 7 mEq/L (ref 3–11)
BUN: 10.7 mg/dL (ref 7.0–26.0)
CO2: 24 meq/L (ref 22–29)
Calcium: 8.9 mg/dL (ref 8.4–10.4)
Chloride: 109 mEq/L (ref 98–109)
Creatinine: 1.2 mg/dL — ABNORMAL HIGH (ref 0.6–1.1)
EGFR: 54 mL/min/{1.73_m2} — AB (ref >90)
GLUCOSE: 99 mg/dL (ref 70–140)
POTASSIUM: 3.8 meq/L (ref 3.5–5.1)
SODIUM: 140 meq/L (ref 136–145)
TOTAL PROTEIN: 7.3 g/dL (ref 6.4–8.3)
Total Bilirubin: 0.53 mg/dL (ref 0.20–1.20)

## 2016-04-11 LAB — CBC WITH DIFFERENTIAL/PLATELET
BASO%: 0.4 % (ref 0.0–2.0)
Basophils Absolute: 0 10*3/uL (ref 0.0–0.1)
EOS ABS: 0.1 10*3/uL (ref 0.0–0.5)
EOS%: 1.1 % (ref 0.0–7.0)
HCT: 29.7 % — ABNORMAL LOW (ref 34.8–46.6)
HGB: 9.4 g/dL — ABNORMAL LOW (ref 11.6–15.9)
LYMPH%: 24.9 % (ref 14.0–49.7)
MCH: 26.2 pg (ref 25.1–34.0)
MCHC: 31.7 g/dL (ref 31.5–36.0)
MCV: 82.6 fL (ref 79.5–101.0)
MONO#: 0.4 10*3/uL (ref 0.1–0.9)
MONO%: 5.2 % (ref 0.0–14.0)
NEUT%: 68.4 % (ref 38.4–76.8)
NEUTROS ABS: 4.9 10*3/uL (ref 1.5–6.5)
Platelets: 383 10*3/uL (ref 145–400)
RBC: 3.6 10*6/uL — AB (ref 3.70–5.45)
RDW: 17.5 % — ABNORMAL HIGH (ref 11.2–14.5)
WBC: 7.1 10*3/uL (ref 3.9–10.3)
lymph#: 1.8 10*3/uL (ref 0.9–3.3)

## 2016-04-11 NOTE — Progress Notes (Signed)
  [place] OFFICE PROGRESS NOTE   Diagnosis:  Gastrointestinal stromal tumor  INTERVAL HISTORY:   Brianna Walker returns as scheduled. She continues Gleevec. She reports she has not been feeling well for the past month. She is having intermittent nausea, anorexia, frequent bowel movements. No frank diarrhea. She notes an alteration in taste. No fever.  Objective:  Vital signs in last 24 hours:  Pulse 92, temperature 97.9 F (36.6 C), temperature source Oral, resp. rate 18, height 5' 4.75" (1.645 m), weight 179 lb 1.6 oz (81.2 kg), SpO2 100 %.    HEENT: No thrush or ulcers. Resp: Lungs clear bilaterally. Cardio: Regular rate and rhythm. GI: Abdomen soft and nontender. No hepatomegaly. No mass. Well-healed surgical incisions. Vascular: No leg edema.   Lab Results:  Lab Results  Component Value Date   WBC 7.1 04/11/2016   HGB 9.4 (L) 04/11/2016   HCT 29.7 (L) 04/11/2016   MCV 82.6 04/11/2016   PLT 383 04/11/2016   NEUTROABS 4.9 04/11/2016    Imaging:  No results found.  Medications: I have reviewed the patient's current medications.  Assessment/Plan: 1. Gastrointestinal stromal tumor of the rectum  Status post an endoscopic ultrasound on 08/06/2014 confirming a mass abutting the distal rectal wall with an FNA biopsy confirming a gastrointestinal stromal tumor  Initiation of Gleevec 09/04/2014  MRI pelvis 12/15/2014 with interval decreased size of the large anorectal mass with central necrosis.  Continuation of Gleevec  Gleevec placed on hold 06/02/2015 in anticipation of surgery  06/04/2015 status post low anterior resection and diverting loop ileostomy, resection of tumor  Pathology: 9.5 cm tumor; GISTsubtype spindle; mitotic rate 1/50 high-power field; positive margin of resection  Adjuvant Gleevec 08/19/2015 (three-year in total course planned)  Held 01/28/2016 through 02/14/2016, resumed 02/15/2016 2. Remote history of pulmonary  embolism-maintained on apixaban 3. Multiple colon polyps noted on the colonoscopy 07/21/2014 with the pathology revealing tubular adenomas 4. Hospitalization 06/21/2015 through 07/06/2015 with nausea/vomiting, high ileostomy output; found to have delayed gastric emptying. 5. Water soluble contrast enema 08/13/2015 positive for a small to moderate volume of water soluble contrast leakage from the low anterior resection and anastomosis site in the pelvis 6. Ileostomy takedown and resection of cystic abdominal wall mass ( benign pathology) on 01/28/2016   Disposition: Brianna Walker appears stable. She reports a one-month history of intermittent nausea, anorexia and frequent bowel movements. She feels these symptoms are related to [place]. We also discussed the possibility symptoms are related to cancer. We will place Gleevec on hold for one week. She will contact the office mid next week with an update. We scheduled a return visit in one month.  Plan reviewed with Dr. Benay Spice.  Ned Card ANP/GNP-BC   04/11/2016  4:03 PM

## 2016-04-11 NOTE — Telephone Encounter (Signed)
Appointments scheduled per 04/11/16 los. A copy of the AVS report and appointment schedule, was given to the patient, per 04/11/16 los.

## 2016-04-13 ENCOUNTER — Encounter: Payer: Self-pay | Admitting: *Deleted

## 2016-04-13 ENCOUNTER — Other Ambulatory Visit: Payer: Self-pay | Admitting: *Deleted

## 2016-04-13 NOTE — Patient Outreach (Signed)
[facility]) Care Management  04/13/2016  Brianna Walker Feb 06, 1946 [id number]   RN followed up with pt as requested earlier this week. Pt states she spoke with her provider and was taken off of her cancer medication for at least one week. Pt states she feels much better and eating more. States the nausea has subsided and she is doing a lot better. RN again reintroduced the [facility] program and her currently involved and extended additional services with a home visit (pt declined). RN also dicussed telephone case management if needed for her ongoing medical issues if she felt she needed additional resources or nursing services to follow up. Pt again appreciative but opt to decline all [facility] services at this time.  RN reviewed the plan of care and verified all goals have been met with no other issues to address at this time. RN informed pt if [facility] services are needed in the future to contact the [facility] office directly for services. Pt very grateful and verbalized an understanding with no additional request or inquires at this time. Case will be closed and pt aware her provider will be notified.  Raina Mina, RN Care Management Coordinator [place] Office [phone]

## 2016-04-19 ENCOUNTER — Telehealth: Payer: Self-pay | Admitting: *Deleted

## 2016-04-19 NOTE — Telephone Encounter (Signed)
Call from pt reporting nausea, "bitter taste" in mouth and diarrhea have all improved since stopping Gleevec. She reports frequent, watery stools on  12/19- she attributes this to something she ate.  Next office visit 1/9. Should she continue to hold [place] or resume?

## 2016-04-19 NOTE — Telephone Encounter (Signed)
Discussed pt's call with Ned Card, NP: Order received to resume [place]. Call office if nausea/diarrhea recurs, may need to reduce dose.  Called pt with above instructions. She voiced understanding.

## 2016-04-26 ENCOUNTER — Telehealth: Payer: Self-pay | Admitting: *Deleted

## 2016-04-26 NOTE — Telephone Encounter (Signed)
Pt returned call, instructed her to [facility], per Ned Card, NP. Try Loperamide for diarrhea. She had been taking Loperamide twice daily in addition to [place]. Instructed her to increase to QID PRN. She voiced understanding.

## 2016-04-26 NOTE — Telephone Encounter (Signed)
Call from pt reporting dysgeusia and diarrhea returned 2 days after resuming Gleevec. Denies nausea though she reports decreased appetite due to dysgeusia. She reports about 10 stools/ day since last week. Pt is taking Pepto Bismol for diarrhea with little relief. Discussed with Ned Card, NP: HOLD Gleevec. Left message on voicemail for pt.

## 2016-04-27 NOTE — Telephone Encounter (Signed)
Ok, f/u 1/9 as scheduled , call for persistent diarrhea off of gleevec

## 2016-04-27 NOTE — Telephone Encounter (Signed)
Called pt to follow up after 12/27 call. She has increased Imodium. Instructed her to call office for persistent diarrhea, follow up 1/9 as scheduled. Remain off [place]. She voiced understanding.

## 2016-05-02 ENCOUNTER — Other Ambulatory Visit: Payer: Commercial Managed Care - HMO

## 2016-05-03 ENCOUNTER — Inpatient Hospital Stay: Admission: RE | Admit: 2016-05-03 | Payer: Commercial Managed Care - HMO | Source: Ambulatory Visit

## 2016-05-09 ENCOUNTER — Other Ambulatory Visit: Payer: Self-pay | Admitting: *Deleted

## 2016-05-09 ENCOUNTER — Telehealth: Payer: Self-pay | Admitting: Oncology

## 2016-05-09 ENCOUNTER — Ambulatory Visit (HOSPITAL_BASED_OUTPATIENT_CLINIC_OR_DEPARTMENT_OTHER): Payer: Medicare HMO | Admitting: Oncology

## 2016-05-09 ENCOUNTER — Other Ambulatory Visit (HOSPITAL_BASED_OUTPATIENT_CLINIC_OR_DEPARTMENT_OTHER): Payer: Medicare HMO

## 2016-05-09 VITALS — BP 138/84 | HR 95 | Temp 98.6°F | Resp 16 | Wt 173.3 lb

## 2016-05-09 DIAGNOSIS — C49A4 Gastrointestinal stromal tumor of large intestine: Secondary | ICD-10-CM

## 2016-05-09 LAB — CBC WITH DIFFERENTIAL/PLATELET
BASO%: 0.1 % (ref 0.0–2.0)
Basophils Absolute: 0 10*3/uL (ref 0.0–0.1)
EOS ABS: 0.1 10*3/uL (ref 0.0–0.5)
EOS%: 1.9 % (ref 0.0–7.0)
HEMATOCRIT: 29.6 % — AB (ref 34.8–46.6)
HEMOGLOBIN: 9.6 g/dL — AB (ref 11.6–15.9)
LYMPH#: 1.6 10*3/uL (ref 0.9–3.3)
LYMPH%: 23.6 % (ref 14.0–49.7)
MCH: 25.9 pg (ref 25.1–34.0)
MCHC: 32.4 g/dL (ref 31.5–36.0)
MCV: 79.8 fL (ref 79.5–101.0)
MONO#: 0.4 10*3/uL (ref 0.1–0.9)
MONO%: 6.4 % (ref 0.0–14.0)
NEUT%: 68 % (ref 38.4–76.8)
NEUTROS ABS: 4.7 10*3/uL (ref 1.5–6.5)
PLATELETS: 340 10*3/uL (ref 145–400)
RBC: 3.71 10*6/uL (ref 3.70–5.45)
RDW: 15.7 % — AB (ref 11.2–14.5)
WBC: 6.9 10*3/uL (ref 3.9–10.3)

## 2016-05-09 LAB — COMPREHENSIVE METABOLIC PANEL
ALBUMIN: 3.2 g/dL — AB (ref 3.5–5.0)
ALK PHOS: 116 U/L (ref 40–150)
ALT: 7 U/L (ref 0–55)
ANION GAP: 10 meq/L (ref 3–11)
AST: 11 U/L (ref 5–34)
BILIRUBIN TOTAL: 0.67 mg/dL (ref 0.20–1.20)
BUN: 12.3 mg/dL (ref 7.0–26.0)
CO2: 24 meq/L (ref 22–29)
CREATININE: 0.9 mg/dL (ref 0.6–1.1)
Calcium: 9.2 mg/dL (ref 8.4–10.4)
Chloride: 108 mEq/L (ref 98–109)
EGFR: 77 mL/min/{1.73_m2} — ABNORMAL LOW (ref >90)
Glucose: 102 mg/dl (ref 70–140)
Potassium: 3.1 mEq/L — ABNORMAL LOW (ref 3.5–5.1)
Sodium: 142 mEq/L (ref 136–145)
TOTAL PROTEIN: 7.5 g/dL (ref 6.4–8.3)

## 2016-05-09 MED ORDER — IMATINIB MESYLATE 100 MG PO TABS
ORAL_TABLET | ORAL | 0 refills | Status: DC
Start: 2016-05-09 — End: 2016-06-05

## 2016-05-09 MED ORDER — LORAZEPAM 0.5 MG PO TABS: 0.5000 mg | ORAL_TABLET | Freq: Every evening | ORAL | 0 refills | Status: DC | PRN

## 2016-05-09 MED FILL — GLEEVEC 100 MG TABLET: 100 | 30 days supply | Qty: 60 | Fill #0

## 2016-05-09 MED FILL — LORazepam 0.5 MG TABS: 0.5 | 30 days supply | Qty: 30 | Fill #0

## 2016-05-09 NOTE — Telephone Encounter (Signed)
Appointments scheduled per 1/9 LOS. Patient given AVS report and calendars with future scheduled appointments. °

## 2016-05-09 NOTE — Progress Notes (Signed)
  [place] OFFICE PROGRESS NOTE   Diagnosis: Gastrointestinal stromal tumor  INTERVAL HISTORY:   Brianna Walker returns as scheduled. She has been maintained off of [place] for the past several weeks after developing altered taste and diarrhea while on Gleevec. The symptoms resolved. She now feels well. The symptoms recurred repeatedly when she was taken on an off of Gleevec twice last month. She continues to have mild leakage from the perineal wound  Objective:  Vital signs in last 24 hours:  Pulse 95, temperature 98.6 F (37 C), temperature source Oral, resp. rate 16, weight 173 lb 4.8 oz (78.6 kg), SpO2 100 %.    HEENT: No thrush or ulcers Resp: Lungs clear bilaterally Cardio: Regular rate and rhythm GI: No hepatomegaly, no mass, nontender Vascular: No leg edema  Lab Results:  Lab Results  Component Value Date   WBC 6.9 05/09/2016   HGB 9.6 (L) 05/09/2016   HCT 29.6 (L) 05/09/2016   MCV 79.8 05/09/2016   PLT 340 05/09/2016   NEUTROABS 4.7 05/09/2016     Medications: I have reviewed the patient's current medications.  Assessment/Plan: 1. Gastrointestinal stromal tumor of the rectum  Status post an endoscopic ultrasound on 08/06/2014 confirming a mass abutting the distal rectal wall with an FNA biopsy confirming a gastrointestinal stromal tumor  Initiation of Gleevec 09/04/2014  MRI pelvis 12/15/2014 with interval decreased size of the large anorectal mass with central necrosis.  Continuation of Gleevec  Gleevec placed on hold 06/02/2015 in anticipation of surgery  06/04/2015 status post low anterior resection and diverting loop ileostomy, resection of tumor  Pathology: 9.5 cm tumor; GISTsubtype spindle; mitotic rate 1/50 high-power field; positive margin of resection  Adjuvant Gleevec 08/19/2015 (three-year in total course planned)  Ileostomy takedown 01/28/2016  Held 01/28/2016 through 02/14/2016, resumed 02/15/2016  Gleevec stop  04/11/2016-04/18/2016, and 04/26/2016-05/08/2016  Gleevec resumed at a dose of 200 mg daily on 05/09/2016 2. Remote history of pulmonary embolism-maintained on apixaban 3. Multiple colon polyps noted on the colonoscopy 07/21/2014 with the pathology revealing tubular adenomas 4. Hospitalization 06/21/2015 through 07/06/2015 with nausea/vomiting, high ileostomy output; found to have delayed gastric emptying. 5. Water soluble contrast enema 08/13/2015 positive for a small to moderate volume of water soluble contrast leakage from the low anterior resection and anastomosis site in the pelvis 6. Ileostomy takedown and resection of cystic abdominal wall mass ( benign pathology) on 01/28/2016     Disposition:  Brianna Walker has developed diarrhea and altered taste while on Gleevec. She has been off of [place] for the past few weeks and her symptoms have resolved. The goal is for her to complete 3 years of [place] therapy. We decided to resume Gleevec at a reduced dose. She will begin Gleevec at a dose of 200 mg daily. Brianna Walker will return for an office and lab visit in 2 weeks.  Approximately 25 minutes were spent with the patient today. The majority of the time was used for counseling and coordination of care.  Betsy Coder, MD  05/09/2016  3:48 PM

## 2016-05-10 ENCOUNTER — Telehealth: Payer: Self-pay

## 2016-05-10 MED ORDER — POTASSIUM CHLORIDE CRYS ER 20 MEQ PO TBCR: EXTENDED_RELEASE_TABLET | ORAL | 1 refills | Status: DC

## 2016-05-10 NOTE — Telephone Encounter (Signed)
-----   Message from Owens Shark, NP sent at 05/09/2016  4:56 PM EST ----- Please confirm if she is taking potassium as on her medication list (20 meq daily). If she is not taking potassium have her take 20 meq twice a day for 3 days then 20 meq daily. If she is taking 20 meq daily, increase to 20 meq twice a day. Repeat BMET next visit. Thanks.

## 2016-05-10 NOTE — Telephone Encounter (Signed)
Called pt to see if she was taking potassium at home, pt states she is out of this medication so she has not been taking it in awhile. Informed pt new rx would be sent to her preferred [facility] [address]. Pt verbalized understanding.

## 2016-05-23 ENCOUNTER — Ambulatory Visit (HOSPITAL_BASED_OUTPATIENT_CLINIC_OR_DEPARTMENT_OTHER): Payer: Medicare HMO | Admitting: Nurse Practitioner

## 2016-05-23 ENCOUNTER — Other Ambulatory Visit (HOSPITAL_BASED_OUTPATIENT_CLINIC_OR_DEPARTMENT_OTHER): Payer: Medicare HMO

## 2016-05-23 ENCOUNTER — Telehealth: Payer: Self-pay | Admitting: Oncology

## 2016-05-23 VITALS — BP 124/78 | HR 94 | Temp 98.0°F | Resp 18 | Ht 64.75 in | Wt 172.7 lb

## 2016-05-23 DIAGNOSIS — C49A4 Gastrointestinal stromal tumor of large intestine: Secondary | ICD-10-CM

## 2016-05-23 LAB — CBC WITH DIFFERENTIAL/PLATELET
BASO%: 0.7 % (ref 0.0–2.0)
BASOS ABS: 0 10*3/uL (ref 0.0–0.1)
EOS ABS: 0.1 10*3/uL (ref 0.0–0.5)
EOS%: 1.4 % (ref 0.0–7.0)
HCT: 32.3 % — ABNORMAL LOW (ref 34.8–46.6)
HGB: 10.4 g/dL — ABNORMAL LOW (ref 11.6–15.9)
LYMPH%: 24 % (ref 14.0–49.7)
MCH: 25.9 pg (ref 25.1–34.0)
MCHC: 32.2 g/dL (ref 31.5–36.0)
MCV: 80.3 fL (ref 79.5–101.0)
MONO#: 0.3 10*3/uL (ref 0.1–0.9)
MONO%: 4.7 % (ref 0.0–14.0)
NEUT#: 4.7 10*3/uL (ref 1.5–6.5)
NEUT%: 69.2 % (ref 38.4–76.8)
PLATELETS: 382 10*3/uL (ref 145–400)
RBC: 4.02 10*6/uL (ref 3.70–5.45)
RDW: 16 % — ABNORMAL HIGH (ref 11.2–14.5)
WBC: 6.7 10*3/uL (ref 3.9–10.3)
lymph#: 1.6 10*3/uL (ref 0.9–3.3)

## 2016-05-23 LAB — COMPREHENSIVE METABOLIC PANEL
ALT: 7 U/L (ref 0–55)
ANION GAP: 8 meq/L (ref 3–11)
AST: 12 U/L (ref 5–34)
Albumin: 3.3 g/dL — ABNORMAL LOW (ref 3.5–5.0)
Alkaline Phosphatase: 128 U/L (ref 40–150)
BILIRUBIN TOTAL: 0.66 mg/dL (ref 0.20–1.20)
BUN: 11.5 mg/dL (ref 7.0–26.0)
CALCIUM: 9.4 mg/dL (ref 8.4–10.4)
CHLORIDE: 110 meq/L — AB (ref 98–109)
CO2: 23 meq/L (ref 22–29)
Creatinine: 1.4 mg/dL — ABNORMAL HIGH (ref 0.6–1.1)
EGFR: 46 mL/min/{1.73_m2} — AB (ref >90)
Glucose: 92 mg/dl (ref 70–140)
Potassium: 4.2 mEq/L (ref 3.5–5.1)
Sodium: 141 mEq/L (ref 136–145)
Total Protein: 7.6 g/dL (ref 6.4–8.3)

## 2016-05-23 LAB — MAGNESIUM: MAGNESIUM: 1.8 mg/dL (ref 1.5–2.5)

## 2016-05-23 NOTE — Progress Notes (Signed)
  [place] OFFICE PROGRESS NOTE   Diagnosis:  Gastrointestinal stromal tumor  INTERVAL HISTORY:   Brianna Walker returns as scheduled. She resumed Gleevec at a reduced dose of 200 mg daily 05/09/2016. She denies significant diarrhea. Appetite is better. The alteration in taste has resolved. No nausea or vomiting.  Objective:  Vital signs in last 24 hours:  Blood pressure 124/78, pulse 94, temperature 98 F (36.7 C), temperature source Oral, resp. rate 18, height 5' 4.75" (1.645 m), weight 172 lb 11.2 oz (78.3 kg), SpO2 100 %.    HEENT: No thrush or ulcers. Resp: Lungs clear bilaterally. Cardio: Regular rate and rhythm. GI: Abdomen is soft and nontender. No hepatomegaly. No mass. Vascular: No leg edema.   Lab Results:  Lab Results  Component Value Date   WBC 6.7 05/23/2016   HGB 10.4 (L) 05/23/2016   HCT 32.3 (L) 05/23/2016   MCV 80.3 05/23/2016   PLT 382 05/23/2016   NEUTROABS 4.7 05/23/2016    Imaging:  No results found.  Medications: I have reviewed the patient's current medications.  Assessment/Plan: 1. Gastrointestinal stromal tumor of the rectum  Status post an endoscopic ultrasound on 08/06/2014 confirming a mass abutting the distal rectal wall with an FNA biopsy confirming a gastrointestinal stromal tumor  Initiation of Gleevec 09/04/2014  MRI pelvis 12/15/2014 with interval decreased size of the large anorectal mass with central necrosis.  Continuation of Gleevec  Gleevec placed on hold 06/02/2015 in anticipation of surgery  06/04/2015 status post low anterior resection and diverting loop ileostomy, resection of tumor  Pathology: 9.5 cm tumor; GISTsubtype spindle; mitotic rate 1/50 high-power field; positive margin of resection  Adjuvant Gleevec 08/19/2015 (three-year in total course planned)  Ileostomy takedown 01/28/2016  Held 01/28/2016 through 02/14/2016, resumed 02/15/2016  Gleevec stop 04/11/2016-04/18/2016, and  04/26/2016-05/08/2016  Gleevec resumed at a dose of 200 mg daily on 05/09/2016 2. Remote history of pulmonary embolism-maintained on apixaban 3. Multiple colon polyps noted on the colonoscopy 07/21/2014 with the pathology revealing tubular adenomas 4. Hospitalization 06/21/2015 through 07/06/2015 with nausea/vomiting, high ileostomy output; found to have delayed gastric emptying. 5. Water soluble contrast enema 08/13/2015 positive for a small to moderate volume of water soluble contrast leakage from the low anterior resection and anastomosis site in the pelvis 6. Ileostomy takedown and resection of cystic abdominal wall mass ( benign pathology) on 01/28/2016   Disposition: Brianna Walker appears stable. She notes improved tolerance of Gleevec at the reduced dose of 200 mg daily. She will continue the same. We discussed the possibility of dose escalation in the future. She will return for a follow-up visit in 3 weeks. She will contact the office in the interim with any problems.  Plan reviewed with Dr. Benay Spice.  Ned Card ANP/GNP-BC   05/23/2016  3:53 PM

## 2016-05-23 NOTE — Telephone Encounter (Signed)
Appointments scheduled per 05/23/16 los. Patient was given a copy of the appointment schedule and AVS report, per 05/23/16 los. °

## 2016-05-24 LAB — FERRITIN: FERRITIN: 12 ng/mL (ref 9–269)

## 2016-05-25 ENCOUNTER — Other Ambulatory Visit: Payer: Self-pay | Admitting: *Deleted

## 2016-05-25 DIAGNOSIS — C49A4 Gastrointestinal stromal tumor of large intestine: Secondary | ICD-10-CM

## 2016-06-05 ENCOUNTER — Other Ambulatory Visit: Payer: Self-pay | Admitting: Oncology

## 2016-06-05 MED FILL — IMATINIB MESYLATE 100 MG TA: 100 | 30 days supply | Qty: 60 | Fill #0

## 2016-06-13 ENCOUNTER — Telehealth: Payer: Self-pay

## 2016-06-13 ENCOUNTER — Other Ambulatory Visit: Payer: Medicare HMO

## 2016-06-13 ENCOUNTER — Telehealth: Payer: Self-pay | Admitting: Oncology

## 2016-06-13 ENCOUNTER — Ambulatory Visit: Payer: Medicare HMO | Admitting: Nurse Practitioner

## 2016-06-13 NOTE — Telephone Encounter (Signed)
Please cancel and reschedule for next week and the same time

## 2016-06-13 NOTE — Telephone Encounter (Signed)
Called to check on patient after cancelling appt today. Options for reschedule provided. Left message for pt to call back

## 2016-06-13 NOTE — Telephone Encounter (Signed)
Patient called back, states she would like to come in at 245 on 06/20/16. LOS sent

## 2016-06-20 ENCOUNTER — Other Ambulatory Visit (HOSPITAL_BASED_OUTPATIENT_CLINIC_OR_DEPARTMENT_OTHER): Payer: Medicare HMO

## 2016-06-20 ENCOUNTER — Telehealth: Payer: Self-pay | Admitting: Oncology

## 2016-06-20 ENCOUNTER — Other Ambulatory Visit: Payer: Self-pay

## 2016-06-20 ENCOUNTER — Ambulatory Visit (HOSPITAL_BASED_OUTPATIENT_CLINIC_OR_DEPARTMENT_OTHER): Payer: Medicare HMO | Admitting: Nurse Practitioner

## 2016-06-20 VITALS — BP 138/80 | HR 76 | Temp 98.1°F | Resp 18 | Ht 64.75 in | Wt 176.9 lb

## 2016-06-20 DIAGNOSIS — C49A4 Gastrointestinal stromal tumor of large intestine: Secondary | ICD-10-CM | POA: Diagnosis not present

## 2016-06-20 LAB — CBC WITH DIFFERENTIAL/PLATELET
BASO%: 0.7 % (ref 0.0–2.0)
BASOS ABS: 0 10*3/uL (ref 0.0–0.1)
EOS ABS: 0.1 10*3/uL (ref 0.0–0.5)
EOS%: 1.7 % (ref 0.0–7.0)
HCT: 30.4 % — ABNORMAL LOW (ref 34.8–46.6)
HGB: 9.9 g/dL — ABNORMAL LOW (ref 11.6–15.9)
LYMPH%: 27.6 % (ref 14.0–49.7)
MCH: 26.3 pg (ref 25.1–34.0)
MCHC: 32.6 g/dL (ref 31.5–36.0)
MCV: 80.5 fL (ref 79.5–101.0)
MONO#: 0.3 10*3/uL (ref 0.1–0.9)
MONO%: 4.6 % (ref 0.0–14.0)
NEUT#: 3.9 10*3/uL (ref 1.5–6.5)
NEUT%: 65.4 % (ref 38.4–76.8)
PLATELETS: 355 10*3/uL (ref 145–400)
RBC: 3.77 10*6/uL (ref 3.70–5.45)
RDW: 16 % — ABNORMAL HIGH (ref 11.2–14.5)
WBC: 6 10*3/uL (ref 3.9–10.3)
lymph#: 1.7 10*3/uL (ref 0.9–3.3)

## 2016-06-20 LAB — COMPREHENSIVE METABOLIC PANEL
ALT: 6 U/L (ref 0–55)
ANION GAP: 7 meq/L (ref 3–11)
AST: 13 U/L (ref 5–34)
Albumin: 3.2 g/dL — ABNORMAL LOW (ref 3.5–5.0)
Alkaline Phosphatase: 123 U/L (ref 40–150)
BUN: 10.4 mg/dL (ref 7.0–26.0)
CHLORIDE: 110 meq/L — AB (ref 98–109)
CO2: 25 meq/L (ref 22–29)
Calcium: 8.9 mg/dL (ref 8.4–10.4)
Creatinine: 1.1 mg/dL (ref 0.6–1.1)
EGFR: 61 mL/min/{1.73_m2} — AB (ref >90)
Glucose: 79 mg/dl (ref 70–140)
Potassium: 3.8 mEq/L (ref 3.5–5.1)
Sodium: 143 mEq/L (ref 136–145)
Total Bilirubin: 0.83 mg/dL (ref 0.20–1.20)
Total Protein: 7.1 g/dL (ref 6.4–8.3)

## 2016-06-20 LAB — FERRITIN: FERRITIN: 14 ng/mL (ref 9–269)

## 2016-06-20 MED ORDER — IMATINIB MESYLATE 100 MG PO TABS
300.0000 mg | ORAL_TABLET | Freq: Every day | ORAL | 0 refills | Status: DC
Start: 2016-06-20 — End: 2016-08-18

## 2016-06-20 NOTE — Telephone Encounter (Signed)
Gave patient avs report and appointments for March  °

## 2016-06-20 NOTE — Progress Notes (Signed)
  [place] OFFICE PROGRESS NOTE   Diagnosis:  Gastrointestinal stromal tumor  INTERVAL HISTORY:   Brianna Walker returns as scheduled. She continues Gleevec 200 mg daily. She feels well. No taste alteration. She has a good appetite. She is gaining weight. No nausea. No significant diarrhea. No rash. No shortness of breath.  Objective:  Vital signs in last 24 hours:  Blood pressure 138/80, pulse 76, temperature 98.1 F (36.7 C), temperature source Oral, resp. rate 18, height 5' 4.75" (1.645 m), weight 176 lb 14.4 oz (80.2 kg), SpO2 100 %.    HEENT: No thrush or ulcers. Resp: Lungs clear bilaterally. Cardio: Regular rate and rhythm. GI: Abdomen soft and nontender. No hepatomegaly. No mass. Vascular: No leg edema. Skin: No rash.    Lab Results:  Lab Results  Component Value Date   WBC 6.0 06/20/2016   HGB 9.9 (L) 06/20/2016   HCT 30.4 (L) 06/20/2016   MCV 80.5 06/20/2016   PLT 355 06/20/2016   NEUTROABS 3.9 06/20/2016    Imaging:  No results found.  Medications: I have reviewed the patient's current medications.  Assessment/Plan: 1. Gastrointestinal stromal tumor of the rectum  Status post an endoscopic ultrasound on 08/06/2014 confirming a mass abutting the distal rectal wall with an FNA biopsy confirming a gastrointestinal stromal tumor  Initiation of Gleevec 09/04/2014  MRI pelvis 12/15/2014 with interval decreased size of the large anorectal mass with central necrosis.  Continuation of Gleevec  Gleevec placed on hold 06/02/2015 in anticipation of surgery  06/04/2015 status post low anterior resection and diverting loop ileostomy, resection of tumor  Pathology: 9.5 cm tumor; GISTsubtype spindle; mitotic rate 1/50 high-power field; positive margin of resection  Adjuvant Gleevec 08/19/2015 (three-year in total course planned)  Ileostomy takedown 01/28/2016  Held 01/28/2016 through 02/14/2016, resumed 02/15/2016  Gleevec stop  04/11/2016-04/18/2016, and 04/26/2016-05/08/2016  Gleevec resumed at a dose of 200 mg daily on 05/09/2016  Gleevec dose increased to 300 mg daily beginning 06/20/2016 2. Remote history of pulmonary embolism-maintained on apixaban 3. Multiple colon polyps noted on the colonoscopy 07/21/2014 with the pathology revealing tubular adenomas 4. Hospitalization 06/21/2015 through 07/06/2015 with nausea/vomiting, high ileostomy output; found to have delayed gastric emptying. 5. Water soluble contrast enema 08/13/2015 positive for a small to moderate volume of water soluble contrast leakage from the low anterior resection and anastomosis site in the pelvis 6. Ileostomy takedown and resection of cystic abdominal wall mass ( benign pathology) on 01/28/2016    Disposition: Brianna Walker appears stable. She is tolerating Gleevec 200 mg daily well. Dr. Benay Spice recommends escalating the dose to 300 mg daily. She is in agreement with this plan. She understands to contact the office with poor tolerance of the higher dose. Otherwise she will return for labs and a follow-up visit in 4 weeks.    Ned Card ANP/GNP-BC   06/20/2016  3:20 PM

## 2016-06-23 ENCOUNTER — Telehealth: Payer: Self-pay

## 2016-06-23 NOTE — Telephone Encounter (Signed)
Pt called stating her gleevec was increased from 200 mg to 300 mg on Tuesday. She is again having side effect of passing solid stool 2-3 times per hour. Also her taste buds are sensing bitter.  She had previously been decreased from 400 mg to 200 mg for the same SE.  S/w Ned Card NP and called pt back. She is to decrease gleevec back to 200 mg daily. She thanked [place] for our assistance.

## 2016-06-26 ENCOUNTER — Telehealth: Payer: Self-pay | Admitting: *Deleted

## 2016-06-26 ENCOUNTER — Other Ambulatory Visit: Payer: Self-pay | Admitting: *Deleted

## 2016-06-26 DIAGNOSIS — C49A4 Gastrointestinal stromal tumor of large intestine: Secondary | ICD-10-CM

## 2016-06-26 MED ORDER — FERROUS SULFATE 325 (65 FE) MG PO TABS
325.0000 mg | ORAL_TABLET | Freq: Two times a day (BID) | ORAL | 3 refills | Status: DC
Start: 2016-06-26 — End: 2024-01-19

## 2016-06-26 NOTE — Telephone Encounter (Signed)
-----   Message from Ladell Pier, MD sent at 06/23/2016  4:50 PM EST ----- Please call patient, iron is low, increase ferrous sulfate to bid, repeat ferritin next visit

## 2016-06-26 NOTE — Telephone Encounter (Signed)
Patient notified per Dr. Benay Spice that iron is low, tto increase ferrous sulfate to bid and that ferritin will be repeated at next visit.  Teach back done.  Patient appreciative of call and has no questions at this time.

## 2016-07-04 MED FILL — IMATINIB MESYLATE 100 MG TA: 100 | 30 days supply | Qty: 90 | Fill #0

## 2016-07-05 ENCOUNTER — Other Ambulatory Visit: Payer: Self-pay | Admitting: Internal Medicine

## 2016-07-18 ENCOUNTER — Telehealth: Payer: Self-pay | Admitting: Nurse Practitioner

## 2016-07-18 ENCOUNTER — Ambulatory Visit (HOSPITAL_BASED_OUTPATIENT_CLINIC_OR_DEPARTMENT_OTHER): Payer: Medicare HMO | Admitting: Nurse Practitioner

## 2016-07-18 ENCOUNTER — Other Ambulatory Visit (HOSPITAL_BASED_OUTPATIENT_CLINIC_OR_DEPARTMENT_OTHER): Payer: Medicare HMO

## 2016-07-18 VITALS — BP 139/79 | HR 74 | Temp 98.1°F | Resp 18 | Wt 179.2 lb

## 2016-07-18 DIAGNOSIS — D649 Anemia, unspecified: Secondary | ICD-10-CM

## 2016-07-18 DIAGNOSIS — Z86718 Personal history of other venous thrombosis and embolism: Secondary | ICD-10-CM | POA: Diagnosis not present

## 2016-07-18 DIAGNOSIS — C49A4 Gastrointestinal stromal tumor of large intestine: Secondary | ICD-10-CM | POA: Diagnosis not present

## 2016-07-18 LAB — CBC WITH DIFFERENTIAL/PLATELET
BASO%: 0.6 % (ref 0.0–2.0)
Basophils Absolute: 0 10*3/uL (ref 0.0–0.1)
EOS ABS: 0.1 10*3/uL (ref 0.0–0.5)
EOS%: 2 % (ref 0.0–7.0)
HCT: 33.1 % — ABNORMAL LOW (ref 34.8–46.6)
HEMOGLOBIN: 10.8 g/dL — AB (ref 11.6–15.9)
LYMPH%: 20.9 % (ref 14.0–49.7)
MCH: 27 pg (ref 25.1–34.0)
MCHC: 32.7 g/dL (ref 31.5–36.0)
MCV: 82.6 fL (ref 79.5–101.0)
MONO#: 0.3 10*3/uL (ref 0.1–0.9)
MONO%: 4.8 % (ref 0.0–14.0)
NEUT%: 71.7 % (ref 38.4–76.8)
NEUTROS ABS: 5.1 10*3/uL (ref 1.5–6.5)
PLATELETS: 367 10*3/uL (ref 145–400)
RBC: 4.01 10*6/uL (ref 3.70–5.45)
RDW: 16 % — AB (ref 11.2–14.5)
WBC: 7.1 10*3/uL (ref 3.9–10.3)
lymph#: 1.5 10*3/uL (ref 0.9–3.3)

## 2016-07-18 LAB — COMPREHENSIVE METABOLIC PANEL
ALT: 8 U/L (ref 0–55)
ANION GAP: 8 meq/L (ref 3–11)
AST: 12 U/L (ref 5–34)
Albumin: 3.3 g/dL — ABNORMAL LOW (ref 3.5–5.0)
Alkaline Phosphatase: 128 U/L (ref 40–150)
BUN: 10.9 mg/dL (ref 7.0–26.0)
CHLORIDE: 110 meq/L — AB (ref 98–109)
CO2: 23 meq/L (ref 22–29)
CREATININE: 1.1 mg/dL (ref 0.6–1.1)
Calcium: 9 mg/dL (ref 8.4–10.4)
EGFR: 58 mL/min/{1.73_m2} — ABNORMAL LOW (ref >90)
Glucose: 107 mg/dl (ref 70–140)
POTASSIUM: 3.8 meq/L (ref 3.5–5.1)
Sodium: 141 mEq/L (ref 136–145)
Total Bilirubin: 0.85 mg/dL (ref 0.20–1.20)
Total Protein: 7.5 g/dL (ref 6.4–8.3)

## 2016-07-18 LAB — FERRITIN: FERRITIN: 14 ng/mL (ref 9–269)

## 2016-07-18 NOTE — Progress Notes (Signed)
[facility] OFFICE PROGRESS NOTE   Diagnosis:  Gastrointestinal stromal tumor  INTERVAL HISTORY:   Ms. Orban returns as scheduled. The Gleevec dose was escalated from 200 mg daily to 300 mg daily following her last office visit 06/20/2016. She contacted the office several days later to report frequent bowel movements and alteration in taste. These were the same side effects she was experiencing at the time the dose was reduced to 200 mg daily. The Gleevec dose was reduced back to 200 mg daily beginning 06/23/2016.  She feels well. No nausea or vomiting. No mouth sores. She has occasional loose stools. She wonders if this may be related to the oral iron. No shortness of breath. No leg edema. No skin rash. She reports a good appetite. She is gaining weight.  Objective:  Vital signs in last 24 hours:  Blood pressure 139/79, pulse 74, temperature 98.1 F (36.7 C), resp. rate 18, weight 179 lb 3.2 oz (81.3 kg), SpO2 99 %.    HEENT: No thrush or ulcers. Resp: Lungs clear bilaterally. Cardio: Regular rate and rhythm. GI: Abdomen soft and nontender. No hepatomegaly. No mass. Vascular: No leg edema. Skin: No rash.    Lab Results:  Lab Results  Component Value Date   WBC 7.1 07/18/2016   HGB 10.8 (L) 07/18/2016   HCT 33.1 (L) 07/18/2016   MCV 82.6 07/18/2016   PLT 367 07/18/2016   NEUTROABS 5.1 07/18/2016    Imaging:  No results found.  Medications: I have reviewed the patient's current medications.  Assessment/Plan: 1. Gastrointestinal stromal tumor of the rectum  Status post an endoscopic ultrasound on 08/06/2014 confirming a mass abutting the distal rectal wall with an FNA biopsy confirming a gastrointestinal stromal tumor  Initiation of Gleevec 09/04/2014  MRI pelvis 12/15/2014 with interval decreased size of the large anorectal mass with central necrosis.  Continuation of Gleevec  Gleevec placed on hold 06/02/2015 in anticipation of  surgery  06/04/2015 status post low anterior resection and diverting loop ileostomy, resection of tumor  Pathology: 9.5 cm tumor; GISTsubtype spindle; mitotic rate 1/50 high-power field; positive margin of resection  Adjuvant Gleevec 08/19/2015 (three-year in total course planned)  Ileostomy takedown 01/28/2016  Held 01/28/2016 through 02/14/2016, resumed 02/15/2016  Gleevec stop 04/11/2016-04/18/2016, and 04/26/2016-05/08/2016  Gleevec resumed at a dose of 200 mg daily on 05/09/2016  Gleevec dose increased to 300 mg daily beginning 06/20/2016  Gleevec dose decreased to 200 mg daily beginning 06/23/2016 (poor tolerance at 300 mg dose) 2. Remote history of pulmonary embolism-maintained on apixaban 3. Multiple colon polyps noted on the colonoscopy 07/21/2014 with the pathology revealing tubular adenomas 4. Hospitalization 06/21/2015 through 07/06/2015 with nausea/vomiting, high ileostomy output; found to have delayed gastric emptying. 5. Water soluble contrast enema 08/13/2015 positive for a small to moderate volume of water soluble contrast leakage from the low anterior resection and anastomosis site in the pelvis 6. Ileostomy takedown and resection of cystic abdominal wall mass ( benign pathology) on 01/28/2016 7. Anemia with ferritin in the low normal range 05/23/2016 and 06/20/2016. Ferrous sulfate increased to twice daily 06/26/2016.     Disposition: Ms. Beldin appears stable. She will continue Gleevec 200 mg daily.  Hemoglobin is better. We will follow-up on the ferritin from today. She will continue ferrous sulfate twice daily.  She will return for labs and a follow-up visit in 4-5 weeks. She will contact the office in the interim with any problems.  Plan reviewed with Dr. Benay Spice.    Ned Card  ANP/GNP-BC   07/18/2016  3:25 PM

## 2016-07-18 NOTE — Telephone Encounter (Signed)
Gave patient AVS and calender per 3/20 los.

## 2016-07-20 ENCOUNTER — Other Ambulatory Visit: Payer: Self-pay | Admitting: Internal Medicine

## 2016-08-07 ENCOUNTER — Other Ambulatory Visit: Payer: Self-pay | Admitting: Internal Medicine

## 2016-08-07 DIAGNOSIS — Z1231 Encounter for screening mammogram for malignant neoplasm of breast: Secondary | ICD-10-CM

## 2016-08-15 ENCOUNTER — Other Ambulatory Visit: Payer: Self-pay | Admitting: Oncology

## 2016-08-18 ENCOUNTER — Other Ambulatory Visit: Payer: Self-pay | Admitting: *Deleted

## 2016-08-18 MED ORDER — IMATINIB MESYLATE 100 MG PO TABS: 200.0000 mg | ORAL_TABLET | Freq: Every day | ORAL | 0 refills | Status: DC

## 2016-08-18 MED FILL — IMATINIB MESYLATE 100 MG TA: 100 | 30 days supply | Qty: 60 | Fill #0

## 2016-08-22 ENCOUNTER — Ambulatory Visit (HOSPITAL_BASED_OUTPATIENT_CLINIC_OR_DEPARTMENT_OTHER): Payer: Medicare HMO | Admitting: Oncology

## 2016-08-22 ENCOUNTER — Telehealth: Payer: Self-pay | Admitting: Oncology

## 2016-08-22 ENCOUNTER — Other Ambulatory Visit (HOSPITAL_BASED_OUTPATIENT_CLINIC_OR_DEPARTMENT_OTHER): Payer: Medicare HMO

## 2016-08-22 VITALS — BP 124/79 | HR 74 | Temp 98.4°F | Resp 18 | Ht 64.75 in | Wt 178.2 lb

## 2016-08-22 DIAGNOSIS — Z86711 Personal history of pulmonary embolism: Secondary | ICD-10-CM | POA: Diagnosis not present

## 2016-08-22 DIAGNOSIS — C49A4 Gastrointestinal stromal tumor of large intestine: Secondary | ICD-10-CM | POA: Diagnosis not present

## 2016-08-22 DIAGNOSIS — D649 Anemia, unspecified: Secondary | ICD-10-CM | POA: Diagnosis not present

## 2016-08-22 LAB — COMPREHENSIVE METABOLIC PANEL
ALT: 7 U/L (ref 0–55)
AST: 14 U/L (ref 5–34)
Albumin: 3.4 g/dL — ABNORMAL LOW (ref 3.5–5.0)
Alkaline Phosphatase: 124 U/L (ref 40–150)
Anion Gap: 9 mEq/L (ref 3–11)
BILIRUBIN TOTAL: 0.95 mg/dL (ref 0.20–1.20)
BUN: 12 mg/dL (ref 7.0–26.0)
CALCIUM: 8.8 mg/dL (ref 8.4–10.4)
CHLORIDE: 109 meq/L (ref 98–109)
CO2: 23 mEq/L (ref 22–29)
CREATININE: 1.2 mg/dL — AB (ref 0.6–1.1)
EGFR: 54 mL/min/{1.73_m2} — ABNORMAL LOW (ref >90)
Glucose: 78 mg/dl (ref 70–140)
Potassium: 3.7 mEq/L (ref 3.5–5.1)
Sodium: 141 mEq/L (ref 136–145)
TOTAL PROTEIN: 7.6 g/dL (ref 6.4–8.3)

## 2016-08-22 LAB — CBC WITH DIFFERENTIAL/PLATELET
BASO%: 0.6 % (ref 0.0–2.0)
Basophils Absolute: 0 10*3/uL (ref 0.0–0.1)
EOS%: 1.7 % (ref 0.0–7.0)
Eosinophils Absolute: 0.1 10*3/uL (ref 0.0–0.5)
HCT: 34.4 % — ABNORMAL LOW (ref 34.8–46.6)
HEMOGLOBIN: 11.4 g/dL — AB (ref 11.6–15.9)
LYMPH#: 2 10*3/uL (ref 0.9–3.3)
LYMPH%: 28.2 % (ref 14.0–49.7)
MCH: 27.4 pg (ref 25.1–34.0)
MCHC: 33.2 g/dL (ref 31.5–36.0)
MCV: 82.5 fL (ref 79.5–101.0)
MONO#: 0.3 10*3/uL (ref 0.1–0.9)
MONO%: 4.3 % (ref 0.0–14.0)
NEUT%: 65.2 % (ref 38.4–76.8)
NEUTROS ABS: 4.6 10*3/uL (ref 1.5–6.5)
Platelets: 357 10*3/uL (ref 145–400)
RBC: 4.18 10*6/uL (ref 3.70–5.45)
RDW: 16 % — AB (ref 11.2–14.5)
WBC: 7.1 10*3/uL (ref 3.9–10.3)

## 2016-08-22 NOTE — Progress Notes (Signed)
  [facility] OFFICE PROGRESS NOTE   Diagnosis: Gastrointestinal stromal tumor   INTERVAL HISTORY:   Brianna Walker returns as scheduled. She continues Gleevec at a dose of 200 mg daily. She has occasional diarrhea, but this is not consistent. No pain. No rash.  Objective:  Vital signs in last 24 hours:  Blood pressure 124/79, pulse 74, temperature 98.4 F (36.9 C), temperature source Oral, resp. rate 18, height 5' 4.75" (1.645 m), weight 178 lb 3.2 oz (80.8 kg), SpO2 100 %.    HEENT: No thrush or ulcers Resp: Lungs clear bilaterally Cardio: Regular rate and rhythm GI: No hepatosplenomegaly, no mass, nontender Vascular: No leg edema  Skin: No rash     Lab Results:  Lab Results  Component Value Date   WBC 7.1 08/22/2016   HGB 11.4 (L) 08/22/2016   HCT 34.4 (L) 08/22/2016   MCV 82.5 08/22/2016   PLT 357 08/22/2016   NEUTROABS 4.6 08/22/2016    Medications: I have reviewed the patient's current medications.  Assessment/Plan: 1. Gastrointestinal stromal tumor of the rectum  Status post an endoscopic ultrasound on 08/06/2014 confirming a mass abutting the distal rectal wall with an FNA biopsy confirming a gastrointestinal stromal tumor  Initiation of Gleevec 09/04/2014  MRI pelvis 12/15/2014 with interval decreased size of the large anorectal mass with central necrosis.  Continuation of Gleevec  Gleevec placed on hold 06/02/2015 in anticipation of surgery  06/04/2015 status post low anterior resection and diverting loop ileostomy, resection of tumor  Pathology: 9.5 cm tumor; GISTsubtype spindle; mitotic rate 1/50 high-power field; positive margin of resection  Adjuvant Gleevec 08/19/2015 (three-year in total course planned)  Ileostomy takedown 01/28/2016  Held 01/28/2016 through 02/14/2016, resumed 02/15/2016  Gleevec stop 04/11/2016-04/18/2016, and 04/26/2016-05/08/2016  Gleevec resumed at a dose of 200 mg daily on 05/09/2016  Gleevec dose  increased to 300 mg daily beginning 06/20/2016  Gleevec dose decreased to 200 mg daily beginning 06/23/2016 (poor tolerance at 300 mg dose) 2. Remote history of pulmonary embolism-maintained on apixaban 3. Multiple colon polyps noted on the colonoscopy 07/21/2014 with the pathology revealing tubular adenomas 4. Hospitalization 06/21/2015 through 07/06/2015 with nausea/vomiting, high ileostomy output; found to have delayed gastric emptying. 5. Water soluble contrast enema 08/13/2015 positive for a small to moderate volume of water soluble contrast leakage from the low anterior resection and anastomosis site in the pelvis 6. Ileostomy takedown and resection of cystic abdominal wall mass ( benign pathology) on 01/28/2016 7. Anemia with ferritin in the low normal range 05/23/2016 and 06/20/2016. Ferrous sulfate increased to twice daily 06/26/2016. Improved.    Disposition:  Brianna Walker is tolerating the 200 mg dose of Gleevec well. She will continue Gleevec. She will return for an office and lab visit in 6 weeks.  15 minutes were spent with the patient today. The majority of the time was used for counseling and coordination of care.  Betsy Coder, MD  08/22/2016  4:19 PM

## 2016-08-22 NOTE — Telephone Encounter (Signed)
Gave patient AVS and calender per 4/24 los.  

## 2016-08-23 ENCOUNTER — Other Ambulatory Visit: Payer: Self-pay | Admitting: *Deleted

## 2016-08-23 NOTE — Telephone Encounter (Signed)
Opened in error

## 2016-09-07 ENCOUNTER — Ambulatory Visit: Payer: Commercial Managed Care - HMO | Admitting: Internal Medicine

## 2016-09-14 ENCOUNTER — Other Ambulatory Visit: Payer: Self-pay | Admitting: Oncology

## 2016-09-14 MED FILL — IMATINIB MESYLATE 100 MG TA: 100 | 30 days supply | Qty: 60 | Fill #0

## 2016-09-15 ENCOUNTER — Ambulatory Visit
Admission: RE | Admit: 2016-09-15 | Discharge: 2016-09-15 | Disposition: A | Discharge status: Home / Self Care | Payer: Medicare HMO | Source: Ambulatory Visit | Attending: Internal Medicine | Admitting: Internal Medicine

## 2016-09-15 DIAGNOSIS — Z1231 Encounter for screening mammogram for malignant neoplasm of breast: Secondary | ICD-10-CM | POA: Diagnosis not present

## 2016-09-19 ENCOUNTER — Other Ambulatory Visit: Payer: Self-pay | Admitting: Internal Medicine

## 2016-09-20 ENCOUNTER — Encounter: Payer: Medicare HMO | Admitting: Internal Medicine

## 2016-09-20 ENCOUNTER — Other Ambulatory Visit: Payer: Self-pay | Admitting: Internal Medicine

## 2016-10-03 ENCOUNTER — Other Ambulatory Visit: Payer: Medicare HMO

## 2016-10-03 ENCOUNTER — Ambulatory Visit: Payer: Medicare HMO | Admitting: Nurse Practitioner

## 2016-10-05 ENCOUNTER — Ambulatory Visit (INDEPENDENT_AMBULATORY_CARE_PROVIDER_SITE_OTHER): Payer: Medicare HMO | Admitting: Internal Medicine

## 2016-10-05 ENCOUNTER — Encounter: Payer: Self-pay | Admitting: Internal Medicine

## 2016-10-05 ENCOUNTER — Other Ambulatory Visit: Payer: Self-pay | Admitting: Oncology

## 2016-10-05 ENCOUNTER — Other Ambulatory Visit (INDEPENDENT_AMBULATORY_CARE_PROVIDER_SITE_OTHER): Payer: Medicare HMO

## 2016-10-05 VITALS — BP 108/70 | HR 78 | Ht 63.5 in | Wt 182.0 lb

## 2016-10-05 DIAGNOSIS — E538 Deficiency of other specified B group vitamins: Secondary | ICD-10-CM

## 2016-10-05 DIAGNOSIS — Z0000 Encounter for general adult medical examination without abnormal findings: Secondary | ICD-10-CM | POA: Diagnosis not present

## 2016-10-05 DIAGNOSIS — E2839 Other primary ovarian failure: Secondary | ICD-10-CM

## 2016-10-05 DIAGNOSIS — E0520 Thyrotoxicosis with toxic multinodular goiter without thyrotoxic crisis or storm: Secondary | ICD-10-CM

## 2016-10-05 DIAGNOSIS — Z0001 Encounter for general adult medical examination with abnormal findings: Secondary | ICD-10-CM | POA: Insufficient documentation

## 2016-10-05 DIAGNOSIS — Z1159 Encounter for screening for other viral diseases: Secondary | ICD-10-CM

## 2016-10-05 DIAGNOSIS — D509 Iron deficiency anemia, unspecified: Secondary | ICD-10-CM

## 2016-10-05 DIAGNOSIS — E559 Vitamin D deficiency, unspecified: Secondary | ICD-10-CM

## 2016-10-05 LAB — CBC WITH DIFFERENTIAL/PLATELET
Basophils Absolute: 0 10*3/uL (ref 0.0–0.1)
Basophils Relative: 0.6 % (ref 0.0–3.0)
Eosinophils Absolute: 0.1 10*3/uL (ref 0.0–0.7)
Eosinophils Relative: 1.2 % (ref 0.0–5.0)
HCT: 34.3 % — ABNORMAL LOW (ref 36.0–46.0)
Hemoglobin: 11.5 g/dL — ABNORMAL LOW (ref 12.0–15.0)
LYMPHS ABS: 1.6 10*3/uL (ref 0.7–4.0)
Lymphocytes Relative: 19.3 % (ref 12.0–46.0)
MCHC: 33.3 g/dL (ref 30.0–36.0)
MCV: 84 fl (ref 78.0–100.0)
MONOS PCT: 4.4 % (ref 3.0–12.0)
Monocytes Absolute: 0.4 10*3/uL (ref 0.1–1.0)
NEUTROS ABS: 6.1 10*3/uL (ref 1.4–7.7)
NEUTROS PCT: 74.5 % (ref 43.0–77.0)
PLATELETS: 318 10*3/uL (ref 150.0–400.0)
RBC: 4.09 Mil/uL (ref 3.87–5.11)
RDW: 15.5 % (ref 11.5–15.5)
WBC: 8.2 10*3/uL (ref 4.0–10.5)

## 2016-10-05 LAB — T4, FREE: FREE T4: 0.7 ng/dL (ref 0.60–1.60)

## 2016-10-05 LAB — BASIC METABOLIC PANEL
BUN: 14 mg/dL (ref 6–23)
CALCIUM: 8.9 mg/dL (ref 8.4–10.5)
CHLORIDE: 108 meq/L (ref 96–112)
CO2: 26 meq/L (ref 19–32)
Creatinine, Ser: 1.18 mg/dL (ref 0.40–1.20)
GFR: 58.43 mL/min — ABNORMAL LOW (ref >60.00)
Glucose, Bld: 89 mg/dL (ref 70–99)
POTASSIUM: 3.4 meq/L — AB (ref 3.5–5.1)
SODIUM: 141 meq/L (ref 135–145)

## 2016-10-05 LAB — VITAMIN B12: VITAMIN B 12: 290 pg/mL (ref 211–911)

## 2016-10-05 LAB — HEPATIC FUNCTION PANEL
ALK PHOS: 107 U/L (ref 39–117)
ALT: 7 U/L (ref 0–35)
AST: 11 U/L (ref 0–37)
Albumin: 3.6 g/dL (ref 3.5–5.2)
BILIRUBIN DIRECT: 0.1 mg/dL (ref 0.0–0.3)
TOTAL PROTEIN: 7.3 g/dL (ref 6.0–8.3)
Total Bilirubin: 0.6 mg/dL (ref 0.2–1.2)

## 2016-10-05 LAB — VITAMIN D 25 HYDROXY (VIT D DEFICIENCY, FRACTURES): VITD: 4.53 ng/mL — AB (ref 30.00–100.00)

## 2016-10-05 LAB — IBC PANEL
IRON: 27 ug/dL — AB (ref 42–145)
Saturation Ratios: 9.5 % — ABNORMAL LOW (ref 20.0–50.0)
TRANSFERRIN: 202 mg/dL — AB (ref 212.0–360.0)

## 2016-10-05 LAB — LIPID PANEL
Cholesterol: 123 mg/dL (ref 0–200)
HDL: 42.8 mg/dL (ref >39.00)
LDL Cholesterol: 49 mg/dL (ref 0–99)
NONHDL: 79.88
Total CHOL/HDL Ratio: 3
Triglycerides: 156 mg/dL — ABNORMAL HIGH (ref 0.0–149.0)
VLDL: 31.2 mg/dL (ref 0.0–40.0)

## 2016-10-05 LAB — TSH: TSH: 0.24 u[IU]/mL — ABNORMAL LOW (ref 0.35–4.50)

## 2016-10-05 LAB — HEPATITIS C ANTIBODY: HCV AB: NEGATIVE

## 2016-10-05 NOTE — Assessment & Plan Note (Signed)
For f/u TFT's today

## 2016-10-05 NOTE — Progress Notes (Signed)
Pre visit review using our clinic review tool, if applicable. No additional management support is needed unless otherwise documented below in the visit note. 

## 2016-10-05 NOTE — Assessment & Plan Note (Signed)
Also for iron lab f/u today 

## 2016-10-05 NOTE — Progress Notes (Signed)
Subjective:    Patient ID: Brianna Walker, female    DOB: [birth year], 71 y.o.   MRN: [id number]  HPI Here for wellness and f/u;  Overall doing ok;  Pt denies Chest pain, worsening SOB, DOE, wheezing, orthopnea, PND, worsening LE edema, palpitations, dizziness or syncope.  Pt denies neurological change such as new headache, facial or extremity weakness.  Pt denies polydipsia, polyuria, or low sugar symptoms. Pt states overall good compliance with treatment and medications, good tolerability, and has been trying to follow appropriate diet.  Pt denies worsening depressive symptoms, suicidal ideation or panic. No fever, night sweats, wt loss, loss of appetite, or other constitutional symptoms.  Pt states good ability with ADL's, has low fall risk, home safety reviewed and adequate, no other significant changes in hearing or vision, and active with exercise. With walking 40 min most days of the week. Denies hyper or hypo thyroid symptoms such as voice, skin or hair change. No other exam findings  Wt Readings from Last 3 Encounters:  10/05/16 182 lb (82.6 kg)  08/22/16 178 lb 3.2 oz (80.8 kg)  07/18/16 179 lb 3.2 oz (81.3 kg)   BP Readings from Last 3 Encounters:  10/05/16 108/70  08/22/16 124/79  07/18/16 139/79   Past Medical History:  Diagnosis Date  . AKI (acute kidney injury) ([place]) 06/21/2015  . ALLERGIC RHINITIS 09/12/2007  . Arthritis   . BACK PAIN 06/19/2008  . Cancer ([place])    dx. 4'16 -oral chemotherapy only.  . Colon polyps   . Cramp of limb 08/11/2009  . DEEP VENOUS THROMBOPHLEBITIS, LEG, RIGHT 03/04/2010   right leg  . Diverticulosis   . FREQUENCY, URINARY 10/07/2009  . GERD 12/05/2006  . HEMORRHOIDS 11/17/2006  . HIP PAIN, RIGHT, CHRONIC 08/11/2009  . HX, PERSONAL, TUBERCULOSIS 11/17/2006  . HYPERLIPIDEMIA 09/12/2007  . Hyperlipidemia 09/12/2007   Qualifier: Diagnosis of  By: Jenny Reichmann MD, [facility] Oris   . HYPERTENSION 11/17/2006  . INSOMNIA-SLEEP DISORDER-UNSPEC 06/19/2008  . Iron deficiency  anemia 12/07/2011  . LEG PAIN, RIGHT 06/19/2008  . Long term (current) use of anticoagulants 11/29/2010  . MASS, SUPERFICIAL 09/12/2007  . MENOPAUSAL DISORDER 09/20/2009  . OVERACTIVE BLADDER 03/04/2010  . Perforation of colon ([place]) 11/17/2015  . Pneumonia   . PULMONARY EMBOLISM, HX OF 11/17/2006   [place] [facility] s/p Pneumonia  . RECTAL BLEEDING 10/07/2007  . Rectal mass 06/2014  . SCIATICA, RIGHT 09/12/2007  . Shortness of breath dyspnea    With exertion since chemotherapy tx-oral meds  . SKIN LESION 07/20/2009  . TMJ SYNDROME 11/17/2006  . Tuberculosis    pt. was tx.   Past Surgical History:  Procedure Laterality Date  . ABDOMINAL HYSTERECTOMY    . CHOLECYSTECTOMY OPEN    . EUS N/A 08/06/2014   Procedure: LOWER ENDOSCOPIC ULTRASOUND (EUS);  Surgeon: Milus Banister, MD;  Location: [facility];  Service: Endoscopy;  Laterality: N/A;  . EXCISION MASS ABDOMINAL N/A 01/28/2016   Procedure: EXCISION  ABDOMINAL WALL MASS, CYST, QUESTION FAT NECROSIS, QUESTION SEROMA;  Surgeon: Michael Boston, MD;  Location: [facility];  Service: General;  Laterality: N/A;  . hemorroid surgery  removed at dr Silvio Pate office   x 2  . ILEOSTOMY CLOSURE N/A 01/28/2016   Procedure: TAKEDOWN OF LOOP ILEOSTOMY;  Surgeon: Michael Boston, MD;  Location: [facility];  Service: General;  Laterality: N/A;  . [place]  2002     multiple ventral hernia repair/mesh  . INCISIONAL HERNIA REPAIR  01/13/2005  open w onlay Proceed mesh.  Marland Kitchen LAPAROSCOPIC LYSIS OF ADHESIONS  06/04/2015   Procedure: LAPAROSCOPIC LYSIS OF ADHESIONS;  Surgeon: Michael Boston, MD;  Location: [facility];  Service: General;;  . PROCTOSCOPY N/A 01/28/2016   Procedure: RIGID PROCTOSCOPY, DILATION OF COLORECTAL ANASTOMOTIC STRICTURE;  Surgeon: Michael Boston, MD;  Location: [facility];  Service: General;  Laterality: N/A;  . s/p right hip replaced min invasive hip surgury [facility] oct 2011 Right 2011  . tumor removed off of right shoulder Right   . XI  ROBOTIC ASSISTED LOWER ANTERIOR RESECTION N/A 06/04/2015   Procedure: XI ROBOTIC ASSISTED LYSIS OF ADHESIONS, LOWER ANTERIOR RESECTION TRANS ABDOMINAL AND TRANS ANAL, COLOANAL HANDSEWN ANASTAOSIS, DIVERTING LOPE ILIOSTOMY, RESECTION GIST TUMOR;  Surgeon: Michael Boston, MD;  Location: [facility];  Service: General;  Laterality: N/A;    reports that she quit smoking about 39 years ago. Her smoking use included Cigarettes. She smoked 0.00 packs per day. She has never used smokeless tobacco. She reports that she drinks alcohol. She reports that she does not use drugs. family history includes Colon cancer in her father; Diabetes in her sister; Heart disease in her mother and sister. No Known Allergies Current Outpatient Prescriptions on File Prior to Visit  Medication Sig Dispense Refill  . acetaminophen (TYLENOL) 500 MG tablet Take 1,000 mg by mouth every 6 (six) hours as needed for moderate pain or headache.    Marland Kitchen apixaban (ELIQUIS) 5 MG TABS tablet Take 1 tablet (5 mg total) by mouth 2 (two) times daily. 60 tablet 0  . aspirin 81 MG tablet Take 81 mg by mouth at bedtime.     . benazepril (LOTENSIN) 40 MG tablet TAKE 1 TABLET EVERY EVENING 90 tablet 0  . estradiol (VIVELLE-DOT) 0.025 MG/24HR APPLY 1 PATCH EVERY SUNDAY 16 patch 0  . ferrous sulfate 325 (65 FE) MG tablet Take 1 tablet (325 mg total) by mouth 2 (two) times daily with a meal. 180 tablet 3  . imatinib (GLEEVEC) 100 MG tablet TAKE 2 TABLETS (200 MG TOTAL) BY MOUTH DAILY. TAKE WITH MEALS AND LARGE GLASS OF WATER.CAUTION:CHEMOTHERAPY 60 tablet 0  . loperamide (IMODIUM) 2 MG capsule Take 1-2 capsules (2-4 mg total) by mouth every 8 (eight) hours as needed for diarrhea or loose stools (Use if >2 BM every 8 hours). 30 capsule 0  . LORazepam (ATIVAN) 0.5 MG tablet Take 1 tablet (0.5 mg total) by mouth at bedtime as needed for anxiety. 30 tablet 0  . omeprazole (PRILOSEC) 20 MG capsule Take 1 capsule (20 mg total) by mouth daily. 30 capsule 0  . potassium  chloride SA (K-DUR,KLOR-CON) 20 MEQ tablet Take 1 tablet twice daily for 3 days, then take 1 tablet by mouth daily. 30 tablet 1  . pravastatin (PRAVACHOL) 20 MG tablet TAKE 1 TABLET AT BEDTIME 30 tablet 0  . Propylhexedrine (BENZEDREX) INHA Place 1 each into the nose daily as needed (congestion).    . psyllium (HYDROCIL/METAMUCIL) 95 % PACK Take 1 packet by mouth daily. 56 each 0  . simethicone (MYLICON) 80 MG chewable tablet Chew 1 tablet (80 mg total) by mouth every 6 (six) hours as needed for flatulence. 30 tablet 0  . traMADol (ULTRAM) 50 MG tablet Take 1-2 tablets (50-100 mg total) by mouth every 6 (six) hours as needed for moderate pain. 40 tablet 1   No current facility-administered medications on file prior to visit.    Review of Systems  Constitutional: Negative for other unusual diaphoresis or sweats  HENT: Negative for ear discharge or swelling Eyes: Negative for other worsening visual disturbances Respiratory: Negative for stridor or other swelling  Gastrointestinal: Negative for worsening distension or other blood Genitourinary: Negative for retention or other urinary change Musculoskeletal: Negative for other MSK pain or swelling Skin: Negative for color change or other new lesions Neurological: Negative for worsening tremors and other numbness  Psychiatric/Behavioral: Negative for worsening agitation or other fatigue All other system neg per pt    Objective:   Physical Exam BP 108/70   Pulse 78   Ht 5' 3.5" (1.613 m)   Wt 182 lb (82.6 kg)   SpO2 99%   BMI 31.73 kg/m  VS noted,  Constitutional: Pt appears in NAD HENT: Head: NCAT.  Right Ear: External ear normal.  Left Ear: External ear normal.  Eyes: . Pupils are equal, round, and reactive to light. Conjunctivae and EOM are normal Nose: without d/c or deformity Neck: Neck supple. Gross normal ROM Cardiovascular: Normal rate and regular rhythm.   Pulmonary/Chest: Effort normal and breath sounds without rales or  wheezing.  Abd:  Soft, NT, ND, + BS, no organomegaly Neurological: Pt is alert. At baseline orientation, motor grossly intact Skin: Skin is warm. No rashes, other new lesions, no LE edema Psychiatric: Pt behavior is normal without agitation  No other exam findings  Lab Results  Component Value Date   WBC 7.1 08/22/2016   HGB 11.4 (L) 08/22/2016   HCT 34.4 (L) 08/22/2016   PLT 357 08/22/2016   GLUCOSE 78 08/22/2016   CHOL 128 09/14/2015   TRIG 229.0 (H) 09/14/2015   HDL 34.40 (L) 09/14/2015   LDLDIRECT 49.0 09/14/2015   LDLCALC 81 05/28/2012   ALT 7 08/22/2016   AST 14 08/22/2016   NA 141 08/22/2016   K 3.7 08/22/2016   CL 107 01/31/2016   CREATININE 1.2 (H) 08/22/2016   BUN 12.0 08/22/2016   CO2 23 08/22/2016   TSH 0.14 (L) 03/10/2016   INR 0.94 01/26/2016   HGBA1C 5.6 06/01/2015        Assessment & Plan:

## 2016-10-05 NOTE — Patient Instructions (Signed)

## 2016-10-05 NOTE — Assessment & Plan Note (Signed)

## 2016-10-05 NOTE — Progress Notes (Deleted)
Subjective:   Brianna Walker is a 71 y.o. female who presents for an Initial Medicare Annual Wellness Visit.  Review of Systems    No ROS.  Medicare Wellness Visit.     Sleep patterns: {SX; SLEEP PATTERNS:18802::"feels rested on waking","does not get up to void","gets up *** times nightly to void","sleeps *** hours nightly"}.    Home Safety/Smoke Alarms: Feels safe in home. Smoke alarms in place.  Living environment; residence and Firearm Safety: {Rehab home environment / accessibility:30080::"no firearms","firearms stored safely"}. Seat Belt Safety/Bike Helmet: Wears seat belt.   Counseling:   Eye Exam-  Dental-  Female:   Pap- N/A      Mammo- Last 09/15/16,  BI-RADS CATEGORY  1: Negative    Dexa scan- N/D       CCS- Last 07/21/14, precancerous polyps, recall 5 years     Objective:    There were no vitals filed for this visit. There is no height or weight on file to calculate BMI.   Current Medications (verified) Outpatient Encounter Prescriptions as of 10/05/2016  Medication Sig  . acetaminophen (TYLENOL) 500 MG tablet Take 1,000 mg by mouth every 6 (six) hours as needed for moderate pain or headache.  Marland Kitchen apixaban (ELIQUIS) 5 MG TABS tablet Take 1 tablet (5 mg total) by mouth 2 (two) times daily.  Marland Kitchen aspirin 81 MG tablet Take 81 mg by mouth at bedtime.   . benazepril (LOTENSIN) 40 MG tablet TAKE 1 TABLET EVERY EVENING  . estradiol (VIVELLE-DOT) 0.025 MG/24HR APPLY 1 PATCH EVERY SUNDAY  . ferrous sulfate 325 (65 FE) MG tablet Take 1 tablet (325 mg total) by mouth 2 (two) times daily with a meal.  . imatinib (GLEEVEC) 100 MG tablet TAKE 2 TABLETS (200 MG TOTAL) BY MOUTH DAILY. TAKE WITH MEALS AND LARGE GLASS OF WATER.CAUTION:CHEMOTHERAPY  . loperamide (IMODIUM) 2 MG capsule Take 1-2 capsules (2-4 mg total) by mouth every 8 (eight) hours as needed for diarrhea or loose stools (Use if >2 BM every 8 hours).  . LORazepam (ATIVAN) 0.5 MG tablet Take 1 tablet (0.5 mg total) by  mouth at bedtime as needed for anxiety.  Marland Kitchen omeprazole (PRILOSEC) 20 MG capsule Take 1 capsule (20 mg total) by mouth daily.  . potassium chloride SA (K-DUR,KLOR-CON) 20 MEQ tablet Take 1 tablet twice daily for 3 days, then take 1 tablet by mouth daily. (Patient not taking: Reported on 07/18/2016)  . pravastatin (PRAVACHOL) 20 MG tablet TAKE 1 TABLET AT BEDTIME  . Propylhexedrine (BENZEDREX) INHA Place 1 each into the nose daily as needed (congestion).  . psyllium (HYDROCIL/METAMUCIL) 95 % PACK Take 1 packet by mouth daily. (Patient not taking: Reported on 07/18/2016)  . simethicone (MYLICON) 80 MG chewable tablet Chew 1 tablet (80 mg total) by mouth every 6 (six) hours as needed for flatulence.  . traMADol (ULTRAM) 50 MG tablet Take 1-2 tablets (50-100 mg total) by mouth every 6 (six) hours as needed for moderate pain. (Patient not taking: Reported on 07/18/2016)   No facility-administered encounter medications on file as of 10/05/2016.     Allergies (verified) Patient has no known allergies.   History: Past Medical History:  Diagnosis Date  . AKI (acute kidney injury) ([place]) 06/21/2015  . ALLERGIC RHINITIS 09/12/2007  . Arthritis   . BACK PAIN 06/19/2008  . Cancer ([place])    dx. 4'16 -oral chemotherapy only.  . Colon polyps   . Cramp of limb 08/11/2009  . DEEP VENOUS THROMBOPHLEBITIS, LEG, RIGHT 03/04/2010  right leg  . Diverticulosis   . FREQUENCY, URINARY 10/07/2009  . GERD 12/05/2006  . HEMORRHOIDS 11/17/2006  . HIP PAIN, RIGHT, CHRONIC 08/11/2009  . HX, PERSONAL, TUBERCULOSIS 11/17/2006  . HYPERLIPIDEMIA 09/12/2007  . Hyperlipidemia 09/12/2007   Qualifier: Diagnosis of  By: Jenny Reichmann MD, Hunt Oris   . HYPERTENSION 11/17/2006  . INSOMNIA-SLEEP DISORDER-UNSPEC 06/19/2008  . Iron deficiency anemia 12/07/2011  . LEG PAIN, RIGHT 06/19/2008  . Long term (current) use of anticoagulants 11/29/2010  . MASS, SUPERFICIAL 09/12/2007  . MENOPAUSAL DISORDER 09/20/2009  . OVERACTIVE BLADDER 03/04/2010  . Perforation  of colon ([place]) 11/17/2015  . Pneumonia   . PULMONARY EMBOLISM, HX OF 11/17/2006   [place] [facility] s/p Pneumonia  . RECTAL BLEEDING 10/07/2007  . Rectal mass 06/2014  . SCIATICA, RIGHT 09/12/2007  . Shortness of breath dyspnea    With exertion since chemotherapy tx-oral meds  . SKIN LESION 07/20/2009  . TMJ SYNDROME 11/17/2006  . Tuberculosis    pt. was tx.   Past Surgical History:  Procedure Laterality Date  . ABDOMINAL HYSTERECTOMY    . CHOLECYSTECTOMY OPEN    . EUS N/A 08/06/2014   Procedure: LOWER ENDOSCOPIC ULTRASOUND (EUS);  Surgeon: Milus Banister, MD;  Location: [facility];  Service: Endoscopy;  Laterality: N/A;  . EXCISION MASS ABDOMINAL N/A 01/28/2016   Procedure: EXCISION  ABDOMINAL WALL MASS, CYST, QUESTION FAT NECROSIS, QUESTION SEROMA;  Surgeon: Michael Boston, MD;  Location: [facility];  Service: General;  Laterality: N/A;  . hemorroid surgery  removed at dr Silvio Pate office   x 2  . ILEOSTOMY CLOSURE N/A 01/28/2016   Procedure: TAKEDOWN OF LOOP ILEOSTOMY;  Surgeon: Michael Boston, MD;  Location: [facility];  Service: General;  Laterality: N/A;  . [place]  2002     multiple ventral hernia repair/mesh  . INCISIONAL HERNIA REPAIR  01/13/2005   open w onlay Proceed mesh.  Marland Kitchen LAPAROSCOPIC LYSIS OF ADHESIONS  06/04/2015   Procedure: LAPAROSCOPIC LYSIS OF ADHESIONS;  Surgeon: Michael Boston, MD;  Location: [facility];  Service: General;;  . PROCTOSCOPY N/A 01/28/2016   Procedure: RIGID PROCTOSCOPY, DILATION OF COLORECTAL ANASTOMOTIC STRICTURE;  Surgeon: Michael Boston, MD;  Location: [facility];  Service: General;  Laterality: N/A;  . s/p right hip replaced min invasive hip surgury [facility] oct 2011 Right 2011  . tumor removed off of right shoulder Right   . XI ROBOTIC ASSISTED LOWER ANTERIOR RESECTION N/A 06/04/2015   Procedure: XI ROBOTIC ASSISTED LYSIS OF ADHESIONS, LOWER ANTERIOR RESECTION TRANS ABDOMINAL AND TRANS ANAL, COLOANAL HANDSEWN ANASTAOSIS, DIVERTING LOPE ILIOSTOMY,  RESECTION GIST TUMOR;  Surgeon: Michael Boston, MD;  Location: [facility];  Service: General;  Laterality: N/A;   Family History  Problem Relation Age of Onset  . Colon cancer Father   . Heart disease Mother   . Heart disease Sister   . Diabetes Sister    Social History   Occupational History  . retired    Social History Main Topics  . Smoking status: Former Smoker    Packs/day: 0.00    Types: Cigarettes    Quit date: 06/09/1977  . Smokeless tobacco: Never Used  . Alcohol use 0.0 oz/week     Comment: rare  . Drug use: No     Comment: past marijuana  . Sexual activity: Not on file    Tobacco Counseling Counseling given: Not Answered   Activities of Daily Living In your present state of health, do you have any difficulty  performing the following activities: 02/09/2016 01/28/2016  Hearing? N N  Vision? N N  Difficulty concentrating or making decisions? N N  Walking or climbing stairs? N N  Dressing or bathing? N N  Doing errands, shopping? N N  Preparing Food and eating ? N -  Using the Toilet? N -  In the past six months, have you accidently leaked urine? N -  Do you have problems with loss of bowel control? Y -  Managing your Medications? N -  Managing your Finances? N -  Housekeeping or managing your Housekeeping? N -  Some recent data might be hidden    Immunizations and Health Maintenance Immunization History  Administered Date(s) Administered  . Influenza Split 04/05/2011  . Influenza Whole 03/16/2010  . Influenza,inj,Quad PF,36+ Mos 12/27/2012, 02/11/2014, 05/05/2015, 02/15/2016  . Pneumococcal Conjugate-13 03/05/2014  . Pneumococcal Polysaccharide-23 05/01/2005, 01/22/2013  . Td 09/15/2008   Health Maintenance Due  Topic Date Due  . Hepatitis C Screening  16-Sep-1945  . DEXA SCAN  11/23/2012    Patient Care Team: Biagio Borg, MD as PCP - Huston Foley, MD as Consulting Physician (General Surgery) Ladell Pier, MD as Consulting Physician  (Oncology) Milus Banister, MD as Consulting Physician (Gastroenterology) Juventino Slovak Nelda Severe, MD as Consulting Physician (Family Medicine) Tania Ade, RN as Registered Nurse  Indicate any recent Medical Services you may have received from other than [facility] providers in the past year (date may be approximate).     Assessment:   This is a routine wellness examination for [facility].Physical assessment deferred to PCP.   Hearing/Vision screen No exam data present  Dietary issues and exercise activities discussed:   Diet (meal preparation, eat out, water intake, caffeinated beverages, dairy products, fruits and vegetables): {Desc; diets:16563} Breakfast: Lunch:  Dinner:      Goals    None     Depression Screen PHQ 2/9 Scores 02/09/2016 09/14/2015 02/11/2014 12/27/2012  PHQ - 2 Score 0 1 0 0    Fall Risk Fall Risk  02/09/2016 09/14/2015 02/11/2014 12/27/2012  Falls in the past year? No No No No    Cognitive Function:        Screening Tests Health Maintenance  Topic Date Due  . Hepatitis C Screening  Jul 18, 1945  . DEXA SCAN  11/23/2012  . INFLUENZA VACCINE  11/29/2016  . MAMMOGRAM  09/16/2018  . TETANUS/TDAP  09/16/2018  . COLONOSCOPY  07/21/2019  . PNA vac Low Risk Adult  Completed      Plan:     I have personally reviewed and noted the following in the patient's chart:   . Medical and social history . Use of alcohol, tobacco or illicit drugs  . Current medications and supplements . Functional ability and status . Nutritional status . Physical activity . Advanced directives . List of other physicians . Vitals . Screenings to include cognitive, depression, and falls . Referrals and appointments  In addition, I have reviewed and discussed with patient certain preventive protocols, quality metrics, and best practice recommendations. A written personalized care plan for preventive services as well as general preventive health recommendations were provided to  patient.     Michiel Cowboy, RN   10/05/2016

## 2016-10-06 LAB — URINALYSIS, ROUTINE W REFLEX MICROSCOPIC
Bilirubin Urine: NEGATIVE
Ketones, ur: NEGATIVE
Nitrite: NEGATIVE
SPECIFIC GRAVITY, URINE: 1.02 (ref 1.000–1.030)
Total Protein, Urine: NEGATIVE
UROBILINOGEN UA: 0.2 (ref 0.0–1.0)
Urine Glucose: NEGATIVE
pH: 6 (ref 5.0–8.0)

## 2016-10-09 ENCOUNTER — Other Ambulatory Visit: Payer: Self-pay | Admitting: Internal Medicine

## 2016-10-09 DIAGNOSIS — R7989 Other specified abnormal findings of blood chemistry: Secondary | ICD-10-CM

## 2016-10-09 MED ORDER — VITAMIN D (ERGOCALCIFEROL) 1.25 MG (50000 UNIT) PO CAPS: 50000.0000 [IU] | ORAL_CAPSULE | ORAL | 0 refills | Status: DC

## 2016-10-10 ENCOUNTER — Telehealth: Payer: Self-pay | Admitting: Oncology

## 2016-10-10 ENCOUNTER — Telehealth: Payer: Self-pay

## 2016-10-10 MED ORDER — VITAMIN D (ERGOCALCIFEROL) 1.25 MG (50000 UNIT) PO CAPS: 50000.0000 [IU] | ORAL_CAPSULE | ORAL | 0 refills | Status: DC

## 2016-10-10 MED FILL — IMATINIB MESYLATE 100 MG TA: 100 | 30 days supply | Qty: 60 | Fill #0

## 2016-10-10 NOTE — Telephone Encounter (Signed)
-----   Message from Biagio Borg, MD sent at 10/09/2016  7:44 PM EDT ----- Left message on MyChart, pt to cont same tx except  The test results show that your current treatment is OK, except the Vitamin D level is VERY low, and thyroid test is mildly off as well and suggestive of a mild overactive thyroid condition.  So we need to have you take Vit D 50,000 units per wk for 12 wks, then OTC 2000 units per day after that.  Also we will need to refer you to Endocrinology for possible mild overactive thyroid.  I will send the prescription for the Vit D, and do the referral, and you should hear from the office as well.    Shirron to please inform pt, I will do rx and referral

## 2016-10-10 NOTE — Telephone Encounter (Signed)
Pt stated that she is already being seen by endocrinology and was informed of her vid d results. She needed the med sent to [facility] and I have sent that in for her.

## 2016-10-10 NOTE — Telephone Encounter (Signed)
Left message re 6/19 lab/fu. Schedule mailed.

## 2016-10-17 ENCOUNTER — Ambulatory Visit (HOSPITAL_BASED_OUTPATIENT_CLINIC_OR_DEPARTMENT_OTHER): Payer: Medicare HMO | Admitting: Oncology

## 2016-10-17 ENCOUNTER — Other Ambulatory Visit (HOSPITAL_BASED_OUTPATIENT_CLINIC_OR_DEPARTMENT_OTHER): Payer: Medicare HMO

## 2016-10-17 ENCOUNTER — Telehealth: Payer: Self-pay | Admitting: Oncology

## 2016-10-17 VITALS — BP 138/83 | HR 75 | Temp 98.1°F | Resp 18 | Ht 63.5 in | Wt 180.0 lb

## 2016-10-17 DIAGNOSIS — D649 Anemia, unspecified: Secondary | ICD-10-CM | POA: Diagnosis not present

## 2016-10-17 DIAGNOSIS — C49A4 Gastrointestinal stromal tumor of large intestine: Secondary | ICD-10-CM

## 2016-10-17 DIAGNOSIS — Z86711 Personal history of pulmonary embolism: Secondary | ICD-10-CM

## 2016-10-17 LAB — CBC WITH DIFFERENTIAL/PLATELET
BASO%: 0.6 % (ref 0.0–2.0)
BASOS ABS: 0 10*3/uL (ref 0.0–0.1)
EOS ABS: 0.1 10*3/uL (ref 0.0–0.5)
EOS%: 1.8 % (ref 0.0–7.0)
HCT: 34.1 % — ABNORMAL LOW (ref 34.8–46.6)
HEMOGLOBIN: 11.3 g/dL — AB (ref 11.6–15.9)
LYMPH%: 25.2 % (ref 14.0–49.7)
MCH: 27.7 pg (ref 25.1–34.0)
MCHC: 33.1 g/dL (ref 31.5–36.0)
MCV: 83.9 fL (ref 79.5–101.0)
MONO#: 0.3 10*3/uL (ref 0.1–0.9)
MONO%: 5.3 % (ref 0.0–14.0)
NEUT%: 67.1 % (ref 38.4–76.8)
NEUTROS ABS: 4.3 10*3/uL (ref 1.5–6.5)
PLATELETS: 319 10*3/uL (ref 145–400)
RBC: 4.07 10*6/uL (ref 3.70–5.45)
RDW: 15.4 % — AB (ref 11.2–14.5)
WBC: 6.4 10*3/uL (ref 3.9–10.3)
lymph#: 1.6 10*3/uL (ref 0.9–3.3)

## 2016-10-17 LAB — COMPREHENSIVE METABOLIC PANEL
ALBUMIN: 3.3 g/dL — AB (ref 3.5–5.0)
ALT: 8 U/L (ref 0–55)
AST: 13 U/L (ref 5–34)
Alkaline Phosphatase: 123 U/L (ref 40–150)
Anion Gap: 8 mEq/L (ref 3–11)
BILIRUBIN TOTAL: 0.77 mg/dL (ref 0.20–1.20)
BUN: 13.9 mg/dL (ref 7.0–26.0)
CO2: 24 mEq/L (ref 22–29)
Calcium: 9 mg/dL (ref 8.4–10.4)
Chloride: 110 mEq/L — ABNORMAL HIGH (ref 98–109)
Creatinine: 1.1 mg/dL (ref 0.6–1.1)
EGFR: 57 mL/min/{1.73_m2} — AB (ref >90)
GLUCOSE: 102 mg/dL (ref 70–140)
POTASSIUM: 3.5 meq/L (ref 3.5–5.1)
Sodium: 143 mEq/L (ref 136–145)
TOTAL PROTEIN: 7.5 g/dL (ref 6.4–8.3)

## 2016-10-17 NOTE — Progress Notes (Signed)
Brianna Walker OFFICE PROGRESS NOTE   Diagnosis: Gastric intestinal stromal tumor  INTERVAL HISTORY:   Brianna Walker returns as scheduled. She continues Gleevec. She had an episode of diarrhea after eating polyp in no peppers on 10/15/2016. No consistent diarrhea. No other complaint.  Objective:  Vital signs in last 24 hours:  Blood pressure 138/83, pulse 75, temperature 98.1 F (36.7 C), temperature source Oral, resp. rate 18, height 5' 3.5" (1.613 m), weight 180 lb (81.6 kg), SpO2 100 %.    HEENT: No thrush or ulcers Resp: Lungs clear bilaterally Cardio: Regular rate and rhythm GI: No megaly, no mass, no apparent ascites Vascular: No leg edema  Skin: No rash   Portacath/PICC-without erythema  Lab Results:  Lab Results  Component Value Date   WBC 6.4 10/17/2016   HGB 11.3 (L) 10/17/2016   HCT 34.1 (L) 10/17/2016   MCV 83.9 10/17/2016   PLT 319 10/17/2016   NEUTROABS 4.3 10/17/2016    CMP     Component Value Date/Time   NA 143 10/17/2016 1422   K 3.5 10/17/2016 1422   CL 108 10/05/2016 1610   CO2 24 10/17/2016 1422   GLUCOSE 102 10/17/2016 1422   GLUCOSE 109 (H) 04/26/2006 1434   BUN 13.9 10/17/2016 1422   CREATININE 1.1 10/17/2016 1422   CALCIUM 9.0 10/17/2016 1422   PROT 7.5 10/17/2016 1422   ALBUMIN 3.3 (L) 10/17/2016 1422   AST 13 10/17/2016 1422   ALT 8 10/17/2016 1422   ALKPHOS 123 10/17/2016 1422   BILITOT 0.77 10/17/2016 1422   GFRNONAA >60 02/01/2016 0633   GFRAA >60 02/01/2016 4315     Medications: I have reviewed the patient's current medications.  Assessment/Plan: 1. Gastrointestinal stromal tumor of the rectum  Status post an endoscopic ultrasound on 08/06/2014 confirming a mass abutting the distal rectal wall with an FNA biopsy confirming a gastrointestinal stromal tumor  Initiation of Gleevec 09/04/2014  MRI pelvis 12/15/2014 with interval decreased size of the large anorectal mass with central necrosis.  Continuation  of Gleevec  Gleevec placed on hold 06/02/2015 in anticipation of surgery  06/04/2015 status post low anterior resection and diverting loop ileostomy, resection of tumor  Pathology: 9.5 cm tumor; GISTsubtype spindle; mitotic rate 1/50 high-power field; positive margin of resection  Adjuvant Gleevec 08/19/2015 (three-year in total course planned)  Ileostomy takedown 01/28/2016  Held 01/28/2016 through 02/14/2016, resumed 02/15/2016  Gleevec stop 04/11/2016-04/18/2016, and 04/26/2016-05/08/2016  Gleevec resumed at a dose of 200 mg daily on 05/09/2016  Gleevec dose increased to 300 mg daily beginning 06/20/2016  Gleevec dose decreased to 200 mg daily beginning 06/23/2016 (poor tolerance at 300 mg dose) 2. Remote history of pulmonary embolism-maintained on apixaban 3. Multiple colon polyps noted on the colonoscopy 07/21/2014 with the pathology revealing tubular adenomas 4. Hospitalization 06/21/2015 through 07/06/2015 with nausea/vomiting, high ileostomy output; found to have delayed gastric emptying. 5. Water soluble contrast enema 08/13/2015 positive for a small to moderate volume of water soluble contrast leakage from the low anterior resection and anastomosis site in the pelvis 6. Ileostomy takedown and resection of cystic abdominal wall mass ( benign pathology) on 01/28/2016 7. Anemia with ferritin in the low normal range 05/23/2016 and 06/20/2016. Ferrous sulfate increased to twice daily 06/26/2016. Improved.    Disposition:  Brianna Walker remains in clinical remission from the gastrointestinal stromal tumor. She will continue Gleevec at the current dose. He will return for an office and lab visit in 6 weeks.  15 minutes were spent  with the patient today. The majority of the time was used for counseling and coordination of care.  Donneta Romberg, MD  10/17/2016  3:39 PM

## 2016-10-17 NOTE — Telephone Encounter (Signed)
Appointments scheduled per 10/17/16 los. °Patient was given a copy of the AVS report and appointment schedule, per 10/17/16 los. °

## 2016-10-19 ENCOUNTER — Other Ambulatory Visit: Payer: Self-pay | Admitting: Internal Medicine

## 2016-10-24 ENCOUNTER — Other Ambulatory Visit: Payer: Self-pay | Admitting: Internal Medicine

## 2016-10-24 MED ORDER — ESTRADIOL 0.025 MG/24HR TD PTTW: 1.0000 | MEDICATED_PATCH | TRANSDERMAL | 3 refills | Status: DC

## 2016-11-08 ENCOUNTER — Other Ambulatory Visit: Payer: Self-pay | Admitting: Oncology

## 2016-11-13 MED FILL — IMATINIB MESYLATE 100 MG TA: 100 | 30 days supply | Qty: 60 | Fill #0

## 2016-11-17 ENCOUNTER — Ambulatory Visit: Payer: Medicare HMO | Admitting: Internal Medicine

## 2016-11-28 ENCOUNTER — Ambulatory Visit (HOSPITAL_BASED_OUTPATIENT_CLINIC_OR_DEPARTMENT_OTHER): Payer: Medicare HMO | Admitting: Nurse Practitioner

## 2016-11-28 ENCOUNTER — Other Ambulatory Visit (HOSPITAL_BASED_OUTPATIENT_CLINIC_OR_DEPARTMENT_OTHER): Payer: Medicare HMO

## 2016-11-28 ENCOUNTER — Telehealth: Payer: Self-pay | Admitting: Oncology

## 2016-11-28 ENCOUNTER — Ambulatory Visit (INDEPENDENT_AMBULATORY_CARE_PROVIDER_SITE_OTHER)
Admission: RE | Admit: 2016-11-28 | Discharge: 2016-11-28 | Disposition: A | Discharge status: Home / Self Care | Payer: Medicare HMO | Source: Ambulatory Visit | Attending: Internal Medicine | Admitting: Internal Medicine

## 2016-11-28 VITALS — BP 132/82 | HR 73 | Temp 97.7°F | Resp 17 | Ht 63.5 in | Wt 178.8 lb

## 2016-11-28 DIAGNOSIS — E2839 Other primary ovarian failure: Secondary | ICD-10-CM

## 2016-11-28 DIAGNOSIS — D649 Anemia, unspecified: Secondary | ICD-10-CM | POA: Diagnosis not present

## 2016-11-28 DIAGNOSIS — C49A4 Gastrointestinal stromal tumor of large intestine: Secondary | ICD-10-CM

## 2016-11-28 LAB — COMPREHENSIVE METABOLIC PANEL
ALBUMIN: 3.4 g/dL — AB (ref 3.5–5.0)
ALK PHOS: 118 U/L (ref 40–150)
ALT: 7 U/L (ref 0–55)
AST: 13 U/L (ref 5–34)
Anion Gap: 8 mEq/L (ref 3–11)
BUN: 11.2 mg/dL (ref 7.0–26.0)
CALCIUM: 9.2 mg/dL (ref 8.4–10.4)
CO2: 25 mEq/L (ref 22–29)
CREATININE: 1.1 mg/dL (ref 0.6–1.1)
Chloride: 109 mEq/L (ref 98–109)
EGFR: 57 mL/min/{1.73_m2} — ABNORMAL LOW (ref >90)
Glucose: 90 mg/dl (ref 70–140)
Potassium: 3.7 mEq/L (ref 3.5–5.1)
Sodium: 142 mEq/L (ref 136–145)
Total Bilirubin: 0.97 mg/dL (ref 0.20–1.20)
Total Protein: 7.6 g/dL (ref 6.4–8.3)

## 2016-11-28 LAB — CBC WITH DIFFERENTIAL/PLATELET
BASO%: 0.3 % (ref 0.0–2.0)
BASOS ABS: 0 10*3/uL (ref 0.0–0.1)
EOS%: 1.8 % (ref 0.0–7.0)
Eosinophils Absolute: 0.1 10*3/uL (ref 0.0–0.5)
HEMATOCRIT: 35.3 % (ref 34.8–46.6)
HEMOGLOBIN: 11.6 g/dL (ref 11.6–15.9)
LYMPH#: 1.5 10*3/uL (ref 0.9–3.3)
LYMPH%: 24.1 % (ref 14.0–49.7)
MCH: 28.3 pg (ref 25.1–34.0)
MCHC: 32.9 g/dL (ref 31.5–36.0)
MCV: 86.1 fL (ref 79.5–101.0)
MONO#: 0.2 10*3/uL (ref 0.1–0.9)
MONO%: 4 % (ref 0.0–14.0)
NEUT#: 4.2 10*3/uL (ref 1.5–6.5)
NEUT%: 69.8 % (ref 38.4–76.8)
PLATELETS: 275 10*3/uL (ref 145–400)
RBC: 4.1 10*6/uL (ref 3.70–5.45)
RDW: 14.5 % (ref 11.2–14.5)
WBC: 6 10*3/uL (ref 3.9–10.3)

## 2016-11-28 NOTE — Telephone Encounter (Signed)
Spoke to patient about their appointment on 9/11 - and rescheduled their appointment from 10am to 11am.

## 2016-11-28 NOTE — Progress Notes (Signed)
  [place] OFFICE PROGRESS NOTE   Diagnosis:  Gastrointestinal stromal tumor  INTERVAL HISTORY:   Brianna Walker returns as scheduled. She continues Gleevec 200 mg daily. She had loose stools yesterday. She took Lomotil with good results. She has intermittent loose stools related to her diet. No consistent diarrhea. She denies nausea/vomiting. No mouth sores. No rash.  Objective:  Vital signs in last 24 hours:  Blood pressure 132/82, pulse 73, temperature 97.7 F (36.5 C), temperature source Oral, resp. rate 17, height 5' 3.5" (1.613 m), weight 178 lb 12.8 oz (81.1 kg), SpO2 100 %.    HEENT: No thrush or ulcers. Resp: Lungs clear bilaterally. Cardio: Regular rate and rhythm. GI: Abdomen soft and nontender. No hepatomegaly. No mass. No apparent ascites. Vascular: No leg edema. Skin: No rash.    Lab Results:  Lab Results  Component Value Date   WBC 6.0 11/28/2016   HGB 11.6 11/28/2016   HCT 35.3 11/28/2016   MCV 86.1 11/28/2016   PLT 275 11/28/2016   NEUTROABS 4.2 11/28/2016    Imaging:  No results found.  Medications: I have reviewed the patient's current medications.  Assessment/Plan: 1. Gastrointestinal stromal tumor of the rectum  Status post an endoscopic ultrasound on 08/06/2014 confirming a mass abutting the distal rectal wall with an FNA biopsy confirming a gastrointestinal stromal tumor  Initiation of Gleevec 09/04/2014  MRI pelvis 12/15/2014 with interval decreased size of the large anorectal mass with central necrosis.  Continuation of Gleevec  Gleevec placed on hold 06/02/2015 in anticipation of surgery  06/04/2015 status post low anterior resection and diverting loop ileostomy, resection of tumor  Pathology: 9.5 cm tumor; GISTsubtype spindle; mitotic rate 1/50 high-power field; positive margin of resection  Adjuvant Gleevec 08/19/2015 (three-year in total course planned)  Ileostomy takedown 01/28/2016  Held 01/28/2016 through  02/14/2016, resumed 02/15/2016  Gleevec stop 04/11/2016-04/18/2016, and 04/26/2016-05/08/2016  Gleevec resumed at a dose of 200 mg daily on 05/09/2016  Gleevec dose increased to 300 mg daily beginning 06/20/2016  Gleevec dose decreased to 200 mg daily beginning 06/23/2016 (poor tolerance at 300 mg dose) 2. Remote history of pulmonary embolism-maintained on apixaban 3. Multiple colon polyps noted on the colonoscopy 07/21/2014 with the pathology revealing tubular adenomas 4. Hospitalization 06/21/2015 through 07/06/2015 with nausea/vomiting, high ileostomy output; found to have delayed gastric emptying. 5. Water soluble contrast enema 08/13/2015 positive for a small to moderate volume of water soluble contrast leakage from the low anterior resection and anastomosis site in the pelvis 6. Ileostomy takedown and resection of cystic abdominal wall mass ( benign pathology) on 01/28/2016 7. Anemia with ferritin in the low normal range 05/23/2016 and 06/20/2016. Ferrous sulfate increased to twice daily 06/26/2016.Improved.    Disposition: [name] remains in clinical remission from the gastrointestinal stromal tumor. She will continue Gleevec at the current dose of 200 mg daily. She will return for labs and a follow-up visit in 6 weeks. She will contact the office in the interim with any problems.    Ned Card ANP/GNP-BC   11/28/2016  11:16 AM

## 2016-12-08 ENCOUNTER — Other Ambulatory Visit: Payer: Self-pay | Admitting: Oncology

## 2016-12-08 MED FILL — IMATINIB MESYLATE 100 MG TA: 100 | 30 days supply | Qty: 60 | Fill #0

## 2017-01-02 ENCOUNTER — Other Ambulatory Visit: Payer: Self-pay | Admitting: Oncology

## 2017-01-05 MED FILL — IMATINIB MESYLATE 100 MG TA: 100 | 30 days supply | Qty: 60 | Fill #0

## 2017-01-09 ENCOUNTER — Ambulatory Visit (HOSPITAL_BASED_OUTPATIENT_CLINIC_OR_DEPARTMENT_OTHER): Payer: Medicare HMO | Admitting: Oncology

## 2017-01-09 ENCOUNTER — Telehealth: Payer: Self-pay

## 2017-01-09 ENCOUNTER — Other Ambulatory Visit: Payer: Medicare HMO

## 2017-01-09 ENCOUNTER — Other Ambulatory Visit (HOSPITAL_BASED_OUTPATIENT_CLINIC_OR_DEPARTMENT_OTHER): Payer: Medicare HMO

## 2017-01-09 VITALS — BP 140/70 | HR 69 | Temp 98.5°F | Resp 18 | Ht 63.5 in | Wt 181.0 lb

## 2017-01-09 DIAGNOSIS — C49A4 Gastrointestinal stromal tumor of large intestine: Secondary | ICD-10-CM | POA: Diagnosis not present

## 2017-01-09 LAB — COMPREHENSIVE METABOLIC PANEL
ALBUMIN: 3.3 g/dL — AB (ref 3.5–5.0)
ALK PHOS: 117 U/L (ref 40–150)
ALT: 6 U/L (ref 0–55)
ANION GAP: 8 meq/L (ref 3–11)
AST: 14 U/L (ref 5–34)
BUN: 13.2 mg/dL (ref 7.0–26.0)
CALCIUM: 9.2 mg/dL (ref 8.4–10.4)
CO2: 22 mEq/L (ref 22–29)
Chloride: 111 mEq/L — ABNORMAL HIGH (ref 98–109)
Creatinine: 1.2 mg/dL — ABNORMAL HIGH (ref 0.6–1.1)
EGFR: 56 mL/min/{1.73_m2} — AB (ref >90)
GLUCOSE: 99 mg/dL (ref 70–140)
POTASSIUM: 3.8 meq/L (ref 3.5–5.1)
Sodium: 141 mEq/L (ref 136–145)
Total Bilirubin: 0.77 mg/dL (ref 0.20–1.20)
Total Protein: 7.5 g/dL (ref 6.4–8.3)

## 2017-01-09 LAB — CBC WITH DIFFERENTIAL/PLATELET
BASO%: 0.8 % (ref 0.0–2.0)
BASOS ABS: 0 10*3/uL (ref 0.0–0.1)
EOS ABS: 0.1 10*3/uL (ref 0.0–0.5)
EOS%: 2.5 % (ref 0.0–7.0)
HEMATOCRIT: 34.9 % (ref 34.8–46.6)
HEMOGLOBIN: 11.6 g/dL (ref 11.6–15.9)
LYMPH%: 25.4 % (ref 14.0–49.7)
MCH: 28.3 pg (ref 25.1–34.0)
MCHC: 33.4 g/dL (ref 31.5–36.0)
MCV: 84.8 fL (ref 79.5–101.0)
MONO#: 0.3 10*3/uL (ref 0.1–0.9)
MONO%: 6.3 % (ref 0.0–14.0)
NEUT#: 3.2 10*3/uL (ref 1.5–6.5)
NEUT%: 65 % (ref 38.4–76.8)
Platelets: 298 10*3/uL (ref 145–400)
RBC: 4.11 10*6/uL (ref 3.70–5.45)
RDW: 14.6 % — AB (ref 11.2–14.5)
WBC: 5 10*3/uL (ref 3.9–10.3)
lymph#: 1.3 10*3/uL (ref 0.9–3.3)

## 2017-01-09 NOTE — Progress Notes (Signed)
  [place] OFFICE PROGRESS NOTE   Diagnosis: Gastrointestinal stromal tumor  INTERVAL HISTORY:   Brianna Walker returns as scheduled. She continues Gleevec. She has occasional diarrhea. No abdominal pain.  Objective:  Vital signs in last 24 hours:  Pulse 69, temperature 98.5 F (36.9 C), temperature source Oral, resp. rate 18, height 5' 3.5" (1.613 m), weight 181 lb (82.1 kg), SpO2 100 %.    HEENT: No thrush or ulcers Resp: Lungs clear bilaterally Cardio: Regular rate and rhythm GI: No hepatosplenomegaly, no mass, nontender Vascular: No leg edema  Skin: No rash   Portacath/PICC-without erythema  Lab Results:  Lab Results  Component Value Date   WBC 5.0 01/09/2017   HGB 11.6 01/09/2017   HCT 34.9 01/09/2017   MCV 84.8 01/09/2017   PLT 298 01/09/2017   NEUTROABS 3.2 01/09/2017    CMP     Component Value Date/Time   NA 141 01/09/2017 1059   K 3.8 01/09/2017 1059   CL 108 10/05/2016 1610   CO2 22 01/09/2017 1059   GLUCOSE 99 01/09/2017 1059   GLUCOSE 109 (H) 04/26/2006 1434   BUN 13.2 01/09/2017 1059   CREATININE 1.2 (H) 01/09/2017 1059   CALCIUM 9.2 01/09/2017 1059   PROT 7.5 01/09/2017 1059   ALBUMIN 3.3 (L) 01/09/2017 1059   AST 14 01/09/2017 1059   ALT 6 01/09/2017 1059   ALKPHOS 117 01/09/2017 1059   BILITOT 0.77 01/09/2017 1059   GFRNONAA >60 02/01/2016 0633   GFRAA >60 02/01/2016 8563    Medications: I have reviewed the patient's current medications.  Assessment/Plan: 1. Gastrointestinal stromal tumor of the rectum  Status post an endoscopic ultrasound on 08/06/2014 confirming a mass abutting the distal rectal wall with an FNA biopsy confirming a gastrointestinal stromal tumor  Initiation of Gleevec 09/04/2014  MRI pelvis 12/15/2014 with interval decreased size of the large anorectal mass with central necrosis.  Continuation of Gleevec  Gleevec placed on hold 06/02/2015 in anticipation of surgery  06/04/2015 status post low  anterior resection and diverting loop ileostomy, resection of tumor  Pathology: 9.5 cm tumor; GISTsubtype spindle; mitotic rate 1/50 high-power field; positive margin of resection  Adjuvant Gleevec 08/19/2015 (three-year in total course planned)  Ileostomy takedown 01/28/2016  Held 01/28/2016 through 02/14/2016, resumed 02/15/2016  Gleevec stop 04/11/2016-04/18/2016, and 04/26/2016-05/08/2016  Gleevec resumed at a dose of 200 mg daily on 05/09/2016  Gleevec dose increased to 300 mg daily beginning 06/20/2016  Gleevec dose decreased to 200 mg daily beginning 06/23/2016 (poor tolerance at 300 mg dose) 2. Remote history of pulmonary embolism-maintained on apixaban 3. Multiple colon polyps noted on the colonoscopy 07/21/2014 with the pathology revealing tubular adenomas 4. Hospitalization 06/21/2015 through 07/06/2015 with nausea/vomiting, high ileostomy output; found to have delayed gastric emptying. 5. Water soluble contrast enema 08/13/2015 positive for a small to moderate volume of water soluble contrast leakage from the low anterior resection and anastomosis site in the pelvis 6. Ileostomy takedown and resection of cystic abdominal wall mass ( benign pathology) on 01/28/2016 7. Anemia with ferritin in the low normal range 05/23/2016 and 06/20/2016. Ferrous sulfate increased to twice daily 06/26/2016.Improved.   Disposition:  Brianna Walker appears stable. She will continue Gleevec. She will return for an office and lab visit in 2 months.  15 minutes were spent with the patient today. The majority of the time was used for counseling and coordination of care.  Donneta Romberg, MD  01/09/2017  11:50 AM

## 2017-01-09 NOTE — Telephone Encounter (Signed)
Printed avs and calender for October.m Per 9/11 los

## 2017-01-18 ENCOUNTER — Other Ambulatory Visit: Payer: Self-pay | Admitting: Internal Medicine

## 2017-01-30 ENCOUNTER — Other Ambulatory Visit: Payer: Self-pay | Admitting: Oncology

## 2017-02-07 ENCOUNTER — Telehealth: Payer: Self-pay

## 2017-02-07 NOTE — Telephone Encounter (Signed)
Spoke with patient regarding her "lump on her stomach". Pt wanted to let MD Benay Spice know, but is comfortable keeping her 11/8 appointment at this time. Pt reports no other new symptoms. Pt reports that the "lump" is "small" non-tender and that she believes it is "scar tissue". This RN informed her that MD Benay Spice will be notified and to call [facility] with any further concerns. Pt verbalizes understanding and agrees with plan of care.

## 2017-02-19 MED FILL — IMATINIB MESYLATE 100 MG TA: 100 | 30 days supply | Qty: 60 | Fill #0

## 2017-03-08 ENCOUNTER — Ambulatory Visit (HOSPITAL_BASED_OUTPATIENT_CLINIC_OR_DEPARTMENT_OTHER): Payer: Medicare HMO | Admitting: Nurse Practitioner

## 2017-03-08 ENCOUNTER — Telehealth: Payer: Self-pay

## 2017-03-08 ENCOUNTER — Other Ambulatory Visit (HOSPITAL_BASED_OUTPATIENT_CLINIC_OR_DEPARTMENT_OTHER): Payer: Medicare HMO

## 2017-03-08 ENCOUNTER — Encounter: Payer: Self-pay | Admitting: Nurse Practitioner

## 2017-03-08 ENCOUNTER — Other Ambulatory Visit: Payer: Self-pay | Admitting: Emergency Medicine

## 2017-03-08 VITALS — BP 142/80 | HR 75 | Temp 98.2°F | Resp 18 | Ht 63.5 in | Wt 183.8 lb

## 2017-03-08 DIAGNOSIS — Z23 Encounter for immunization: Secondary | ICD-10-CM | POA: Diagnosis not present

## 2017-03-08 DIAGNOSIS — H5203 Hypermetropia, bilateral: Secondary | ICD-10-CM | POA: Diagnosis not present

## 2017-03-08 DIAGNOSIS — H524 Presbyopia: Secondary | ICD-10-CM | POA: Diagnosis not present

## 2017-03-08 DIAGNOSIS — C49A4 Gastrointestinal stromal tumor of large intestine: Secondary | ICD-10-CM

## 2017-03-08 DIAGNOSIS — H52209 Unspecified astigmatism, unspecified eye: Secondary | ICD-10-CM | POA: Diagnosis not present

## 2017-03-08 LAB — CBC WITH DIFFERENTIAL/PLATELET
BASO%: 0.2 % (ref 0.0–2.0)
BASOS ABS: 0 10*3/uL (ref 0.0–0.1)
EOS ABS: 0.2 10*3/uL (ref 0.0–0.5)
EOS%: 2.6 % (ref 0.0–7.0)
HEMATOCRIT: 35.4 % (ref 34.8–46.6)
HEMOGLOBIN: 11.6 g/dL (ref 11.6–15.9)
LYMPH#: 1.4 10*3/uL (ref 0.9–3.3)
LYMPH%: 20.9 % (ref 14.0–49.7)
MCH: 27.8 pg (ref 25.1–34.0)
MCHC: 32.8 g/dL (ref 31.5–36.0)
MCV: 84.7 fL (ref 79.5–101.0)
MONO#: 0.3 10*3/uL (ref 0.1–0.9)
MONO%: 4.6 % (ref 0.0–14.0)
NEUT#: 4.7 10*3/uL (ref 1.5–6.5)
NEUT%: 71.7 % (ref 38.4–76.8)
PLATELETS: 258 10*3/uL (ref 145–400)
RBC: 4.18 10*6/uL (ref 3.70–5.45)
RDW: 14.3 % (ref 11.2–14.5)
WBC: 6.5 10*3/uL (ref 3.9–10.3)

## 2017-03-08 LAB — COMPREHENSIVE METABOLIC PANEL
ALT: 7 U/L (ref 0–55)
ANION GAP: 8 meq/L (ref 3–11)
AST: 10 U/L (ref 5–34)
Albumin: 3.2 g/dL — ABNORMAL LOW (ref 3.5–5.0)
Alkaline Phosphatase: 119 U/L (ref 40–150)
BUN: 9.5 mg/dL (ref 7.0–26.0)
CHLORIDE: 110 meq/L — AB (ref 98–109)
CO2: 23 meq/L (ref 22–29)
Calcium: 8.8 mg/dL (ref 8.4–10.4)
Creatinine: 1.1 mg/dL (ref 0.6–1.1)
GLUCOSE: 103 mg/dL (ref 70–140)
Potassium: 3.4 mEq/L — ABNORMAL LOW (ref 3.5–5.1)
SODIUM: 142 meq/L (ref 136–145)
TOTAL PROTEIN: 7.5 g/dL (ref 6.4–8.3)
Total Bilirubin: 0.82 mg/dL (ref 0.20–1.20)

## 2017-03-08 MED ORDER — INFLUENZA VAC SPLIT HIGH-DOSE 0.5 ML IM SUSY
0.5000 mL | PREFILLED_SYRINGE | Freq: Once | INTRAMUSCULAR | Status: AC
  Administered 2017-03-08: 0.5 mL via INTRAMUSCULAR
  Filled 2017-03-08: qty 0.5

## 2017-03-08 NOTE — Telephone Encounter (Signed)
Printed avs and calender for upcoming appointment. Per 11/8 los 

## 2017-03-08 NOTE — Progress Notes (Addendum)
  [place] OFFICE PROGRESS NOTE   Diagnosis: Gastrointestinal stromal tumor  INTERVAL HISTORY:   Brianna Walker returns as scheduled.  She has occasional mild nausea mainly in the mornings.  No diarrhea though she does note frequent bowel movements.  No rash.  She notes a "knot" at the right abdomen present for about the past month.  No associated pain.  Objective:  Vital signs in last 24 hours:  Blood pressure (!) 142/80, pulse 75, temperature 98.2 F (36.8 C), temperature source Oral, resp. rate 18, height 5' 3.5" (1.613 m), weight 183 lb 12.8 oz (83.4 kg), SpO2 100 %.    HEENT: No thrush or ulcers. Resp: Lungs clear bilaterally. Cardio: Regular rate and rhythm. GI: Abdomen soft and nontender.  Soft fullness right mid to lower abdomen, question hernia. Vascular: No leg edema.  Skin: No rash.   Lab Results:  Lab Results  Component Value Date   WBC 6.5 03/08/2017   HGB 11.6 03/08/2017   HCT 35.4 03/08/2017   MCV 84.7 03/08/2017   PLT 258 03/08/2017   NEUTROABS 4.7 03/08/2017    Imaging:  No results found.  Medications: I have reviewed the patient's current medications.  Assessment/Plan: 1. Gastrointestinal stromal tumor of the rectum  Status post an endoscopic ultrasound on 08/06/2014 confirming a mass abutting the distal rectal wall with an FNA biopsy confirming a gastrointestinal stromal tumor  Initiation of Gleevec 09/04/2014  MRI pelvis 12/15/2014 with interval decreased size of the large anorectal mass with central necrosis.  Continuation of Gleevec  Gleevec placed on hold 06/02/2015 in anticipation of surgery  06/04/2015 status post low anterior resection and diverting loop ileostomy, resection of tumor  Pathology: 9.5 cm tumor; GISTsubtype spindle; mitotic rate 1/50 high-power field; positive margin of resection  Adjuvant Gleevec 08/19/2015 (three-year in total course planned)  Ileostomy takedown 01/28/2016  Held 01/28/2016  through 02/14/2016, resumed 02/15/2016  Gleevec stop 04/11/2016-04/18/2016, and 04/26/2016-05/08/2016  Gleevec resumed at a dose of 200 mg daily on 05/09/2016  Gleevec dose increased to 300 mg daily beginning 06/20/2016  Gleevec dose decreased to 200 mg daily beginning 06/23/2016 (poor tolerance at 300 mg dose) 2. Remote history of pulmonary embolism-maintained on apixaban 3. Multiple colon polyps noted on the colonoscopy 07/21/2014 with the pathology revealing tubular adenomas 4. Hospitalization 06/21/2015 through 07/06/2015 with nausea/vomiting, high ileostomy output; found to have delayed gastric emptying. 5. Water soluble contrast enema 08/13/2015 positive for a small to moderate volume of water soluble contrast leakage from the low anterior resection and anastomosis site in the pelvis 6. Ileostomy takedown and resection of cystic abdominal wall mass ( benign pathology) on 01/28/2016 7. Anemia with ferritin in the low normal range 05/23/2016 and 06/20/2016. Ferrous sulfate increased to twice daily 06/26/2016.Improved.   Disposition: Brianna Walker appears stable.  She will continue Gleevec 200 mg daily.  The soft fullness at the right mid abdomen is likely a hernia.  She understands to contact the office with any increase in size or associated symptoms such as pain.  She will return for labs and a follow-up visit in 2 months.  Patient seen with Dr. Benay Spice.    Ned Card ANP/GNP-BC   03/08/2017  12:42 PM  This was a shared visit with Ned Card.  Brianna Walker was interviewed and examined.  She remains in clinical remission from the gastrointestinal stromal tumor.  The right abdominal fullness appears to represent a reducible hernia. She will continue Gleevec.  Julieanne Manson, MD

## 2017-03-13 ENCOUNTER — Other Ambulatory Visit: Payer: Self-pay | Admitting: Oncology

## 2017-03-13 ENCOUNTER — Telehealth: Payer: Self-pay | Admitting: Internal Medicine

## 2017-03-13 NOTE — Telephone Encounter (Signed)
Can the patient call her insurance company and ask what medications they do cover that is comparable to the vivelle?

## 2017-03-13 NOTE — Telephone Encounter (Signed)
Dr. Jenny Reichmann omit that route to you.

## 2017-03-13 NOTE — Telephone Encounter (Signed)
Pt stated that her ins is no longer going to cover estradiol (VIVELLE-DOT) 0.025 MG/24HR [[id number]]   She needs [place] to call [phone] and do a PA on them she she can still get them.

## 2017-03-14 MED FILL — IMATINIB MESYLATE 100 MG TA: 100 | 30 days supply | Qty: 60 | Fill #0

## 2017-04-13 MED FILL — IMATINIB MESYLATE 100 MG TA: 100 | 30 days supply | Qty: 60 | Fill #1

## 2017-04-16 ENCOUNTER — Ambulatory Visit (INDEPENDENT_AMBULATORY_CARE_PROVIDER_SITE_OTHER): Payer: Medicare HMO | Admitting: Internal Medicine

## 2017-04-16 ENCOUNTER — Encounter: Payer: Self-pay | Admitting: Internal Medicine

## 2017-04-16 VITALS — BP 138/90 | HR 82 | Temp 98.3°F | Ht 63.5 in

## 2017-04-16 DIAGNOSIS — R739 Hyperglycemia, unspecified: Secondary | ICD-10-CM | POA: Diagnosis not present

## 2017-04-16 DIAGNOSIS — M25512 Pain in left shoulder: Secondary | ICD-10-CM

## 2017-04-16 DIAGNOSIS — G8929 Other chronic pain: Secondary | ICD-10-CM | POA: Diagnosis not present

## 2017-04-16 MED ORDER — MELOXICAM 15 MG PO TABS: 15.0000 mg | ORAL_TABLET | Freq: Every day | ORAL | 5 refills | Status: DC | PRN

## 2017-04-16 NOTE — Patient Instructions (Addendum)
Please take all new medication as prescribed - the anti inflammatory for pain  Please continue all other medications as before, and refills have been done if requested.  Please have the pharmacy call with any other refills you may need.  You will be contacted regarding the referral for: Sports medicine in this office, unless you wish to make an appt at the scheduling desk as you leave  Please keep your appointments with your specialists as you may have planned  Please return in 6 months, or sooner if needed

## 2017-04-16 NOTE — Assessment & Plan Note (Signed)
Lab Results  Component Value Date   HGBA1C 5.6 06/01/2015  stable overall by history and exam, recent data reviewed with pt, and pt to continue medical treatment as before,  to f/u any worsening symptoms or concerns

## 2017-04-16 NOTE — Assessment & Plan Note (Signed)
Exam c/w probable rotater cuff dz vs bursitis , for nsaid prn, refer sport medicine,  to f/u any worsening symptoms or concerns

## 2017-04-16 NOTE — Progress Notes (Signed)
Subjective:    Patient ID: Brianna Walker, female    DOB: 10-19-1945, 71 y.o.   MRN: [id number]  HPI  Here to f/u with c/o 2 mo left shoulder pain, mild to mod, intermittent, sharp, without radiation, worse to abduct which itself is limited to about 90 degrees, not worse with laying on left side, not better with anything.  No trauma.  She thinks maybe caused by bursitis since she had this before, but shoulder nontender.  Tylenol prn helps Past Medical History:  Diagnosis Date  . AKI (acute kidney injury) ([place]) 06/21/2015  . ALLERGIC RHINITIS 09/12/2007  . Arthritis   . BACK PAIN 06/19/2008  . Cancer ([place])    dx. 4'16 -oral chemotherapy only.  . Colon polyps   . Cramp of limb 08/11/2009  . DEEP VENOUS THROMBOPHLEBITIS, LEG, RIGHT 03/04/2010   right leg  . Diverticulosis   . FREQUENCY, URINARY 10/07/2009  . GERD 12/05/2006  . HEMORRHOIDS 11/17/2006  . HIP PAIN, RIGHT, CHRONIC 08/11/2009  . HX, PERSONAL, TUBERCULOSIS 11/17/2006  . HYPERLIPIDEMIA 09/12/2007  . Hyperlipidemia 09/12/2007   Qualifier: Diagnosis of  By: Jenny Reichmann MD, [facility] Oris   . HYPERTENSION 11/17/2006  . INSOMNIA-SLEEP DISORDER-UNSPEC 06/19/2008  . Iron deficiency anemia 12/07/2011  . LEG PAIN, RIGHT 06/19/2008  . Long term (current) use of anticoagulants 11/29/2010  . MASS, SUPERFICIAL 09/12/2007  . MENOPAUSAL DISORDER 09/20/2009  . OVERACTIVE BLADDER 03/04/2010  . Perforation of colon ([place]) 11/17/2015  . Pneumonia   . PULMONARY EMBOLISM, HX OF 11/17/2006   [facility] s/p Pneumonia  . RECTAL BLEEDING 10/07/2007  . Rectal mass 06/2014  . SCIATICA, RIGHT 09/12/2007  . Shortness of breath dyspnea    With exertion since chemotherapy tx-oral meds  . SKIN LESION 07/20/2009  . TMJ SYNDROME 11/17/2006  . Tuberculosis    pt. was tx.   Past Surgical History:  Procedure Laterality Date  . ABDOMINAL HYSTERECTOMY    . CHOLECYSTECTOMY OPEN    . EUS N/A 08/06/2014   Procedure: LOWER ENDOSCOPIC ULTRASOUND (EUS);  Surgeon: Milus Banister, MD;  Location: [facility];  Service: Endoscopy;  Laterality: N/A;  . EXCISION MASS ABDOMINAL N/A 01/28/2016   Procedure: EXCISION  ABDOMINAL WALL MASS, CYST, QUESTION FAT NECROSIS, QUESTION SEROMA;  Surgeon: Michael Boston, MD;  Location: [facility];  Service: General;  Laterality: N/A;  . hemorroid surgery  removed at dr Silvio Pate office   x 2  . ILEOSTOMY CLOSURE N/A 01/28/2016   Procedure: TAKEDOWN OF LOOP ILEOSTOMY;  Surgeon: Michael Boston, MD;  Location: [facility];  Service: General;  Laterality: N/A;  . [place]  2002     multiple ventral hernia repair/mesh  . INCISIONAL HERNIA REPAIR  01/13/2005   open w onlay Proceed mesh.  Marland Kitchen LAPAROSCOPIC LYSIS OF ADHESIONS  06/04/2015   Procedure: LAPAROSCOPIC LYSIS OF ADHESIONS;  Surgeon: Michael Boston, MD;  Location: [facility];  Service: General;;  . PROCTOSCOPY N/A 01/28/2016   Procedure: RIGID PROCTOSCOPY, DILATION OF COLORECTAL ANASTOMOTIC STRICTURE;  Surgeon: Michael Boston, MD;  Location: [facility];  Service: General;  Laterality: N/A;  . s/p right hip replaced min invasive hip surgury [facility] oct 2011 Right 2011  . tumor removed off of right shoulder Right   . XI ROBOTIC ASSISTED LOWER ANTERIOR RESECTION N/A 06/04/2015   Procedure: XI ROBOTIC ASSISTED LYSIS OF ADHESIONS, LOWER ANTERIOR RESECTION TRANS ABDOMINAL AND TRANS ANAL, COLOANAL HANDSEWN ANASTAOSIS, DIVERTING LOPE ILIOSTOMY, RESECTION GIST TUMOR;  Surgeon: Michael Boston, MD;  Location: [facility];  Service: General;  Laterality: N/A;    reports that she quit smoking about 39 years ago. Her smoking use included cigarettes. She smoked 0.00 packs per day. she has never used smokeless tobacco. She reports that she drinks alcohol. She reports that she does not use drugs. family history includes Colon cancer in her father; Diabetes in her sister; Heart disease in her mother and sister. No Known Allergies Current Outpatient Medications on File Prior to Visit  Medication Sig Dispense Refill  .  acetaminophen (TYLENOL) 500 MG tablet Take 1,000 mg by mouth every 6 (six) hours as needed for moderate pain or headache.    Marland Kitchen aspirin 81 MG tablet Take 81 mg by mouth at bedtime.     . benazepril (LOTENSIN) 40 MG tablet TAKE 1 TABLET EVERY EVENING 90 tablet 2  . ELIQUIS 5 MG TABS tablet TAKE 1 TABLET TWICE DAILY 180 tablet 3  . estradiol (VIVELLE-DOT) 0.025 MG/24HR Place 1 patch onto the skin once a week. 90 patch 3  . ferrous sulfate 325 (65 FE) MG tablet Take 1 tablet (325 mg total) by mouth 2 (two) times daily with a meal. 180 tablet 3  . imatinib (GLEEVEC) 100 MG tablet TAKE 2 TABLETS (200 MG TOTAL) BY MOUTH DAILY. TAKE WITH MEALS AND LARGE GLASS OF WATER.CAUTION:CHEMOTHERAPY 60 tablet 1  . loperamide (IMODIUM) 2 MG capsule Take 1-2 capsules (2-4 mg total) by mouth every 8 (eight) hours as needed for diarrhea or loose stools (Use if >2 BM every 8 hours). 30 capsule 0  . LORazepam (ATIVAN) 0.5 MG tablet Take 1 tablet (0.5 mg total) by mouth at bedtime as needed for anxiety. 30 tablet 0  . omeprazole (PRILOSEC) 20 MG capsule TAKE 1 CAPSULE DAILY. 30 capsule 11  . potassium chloride SA (K-DUR,KLOR-CON) 20 MEQ tablet Take 1 tablet twice daily for 3 days, then take 1 tablet by mouth daily. 30 tablet 1  . pravastatin (PRAVACHOL) 20 MG tablet TAKE 1 TABLET AT BEDTIME 30 tablet 11  . Propylhexedrine (BENZEDREX) INHA Place 1 each into the nose daily as needed (congestion).    . psyllium (HYDROCIL/METAMUCIL) 95 % PACK Take 1 packet by mouth daily. 56 each 0  . traMADol (ULTRAM) 50 MG tablet Take 1-2 tablets (50-100 mg total) by mouth every 6 (six) hours as needed for moderate pain. 40 tablet 1  . Vitamin D, Ergocalciferol, (DRISDOL) 50000 units CAPS capsule Take 1 capsule (50,000 Units total) by mouth every 7 (seven) days. 12 capsule 0   No current facility-administered medications on file prior to visit.    Review of Systems All otherwise neg per pt    Objective:   Physical Exam BP 138/90    Pulse 82   Temp 98.3 F (36.8 C) (Oral)   Ht 5' 3.5" (1.613 m)   SpO2 98%   BMI 32.05 kg/m  VS noted,  Constitutional: Pt appears in NAD HENT: Head: NCAT.  Right Ear: External ear normal.  Left Ear: External ear normal.  Eyes: . Pupils are equal, round, and reactive to light. Conjunctivae and EOM are normal Nose: without d/c or deformity Neck: Neck supple. Gross normal ROM Cardiovascular: Normal rate and regular rhythm.   Pulmonary/Chest: Effort normal and breath sounds without rales or wheezing.  Left shoulder NT, but with pain and unable to abduct to 90 degrees  Neurological: Pt is alert. At baseline orientation, motor grossly intact Skin: Skin is warm. No rashes, other new lesions, no LE edema Psychiatric:  Pt behavior is normal without agitation  No other exam findings    Assessment & Plan:

## 2017-04-23 ENCOUNTER — Encounter: Payer: Self-pay | Admitting: Internal Medicine

## 2017-04-23 ENCOUNTER — Encounter: Payer: Self-pay | Admitting: Family Medicine

## 2017-05-03 ENCOUNTER — Other Ambulatory Visit (HOSPITAL_BASED_OUTPATIENT_CLINIC_OR_DEPARTMENT_OTHER): Payer: Medicare HMO

## 2017-05-03 ENCOUNTER — Ambulatory Visit: Payer: Medicare HMO | Admitting: Oncology

## 2017-05-03 ENCOUNTER — Ambulatory Visit (HOSPITAL_BASED_OUTPATIENT_CLINIC_OR_DEPARTMENT_OTHER): Payer: Medicare HMO | Admitting: Oncology

## 2017-05-03 ENCOUNTER — Other Ambulatory Visit: Payer: Medicare HMO

## 2017-05-03 VITALS — BP 148/88 | HR 72 | Temp 98.1°F | Resp 20 | Ht 63.5 in | Wt 186.9 lb

## 2017-05-03 DIAGNOSIS — C49A4 Gastrointestinal stromal tumor of large intestine: Secondary | ICD-10-CM

## 2017-05-03 LAB — COMPREHENSIVE METABOLIC PANEL
ALBUMIN: 3.3 g/dL — AB (ref 3.5–5.0)
ALK PHOS: 113 U/L (ref 40–150)
ALT: 10 U/L (ref 0–55)
AST: 12 U/L (ref 5–34)
Anion Gap: 8 mEq/L (ref 3–11)
BILIRUBIN TOTAL: 0.96 mg/dL (ref 0.20–1.20)
BUN: 11.7 mg/dL (ref 7.0–26.0)
CALCIUM: 9.1 mg/dL (ref 8.4–10.4)
CO2: 26 mEq/L (ref 22–29)
Chloride: 109 mEq/L (ref 98–109)
Creatinine: 1.1 mg/dL (ref 0.6–1.1)
EGFR: 59 mL/min/{1.73_m2} — AB (ref >60)
Glucose: 102 mg/dl (ref 70–140)
Potassium: 4 mEq/L (ref 3.5–5.1)
Sodium: 143 mEq/L (ref 136–145)
Total Protein: 7.6 g/dL (ref 6.4–8.3)

## 2017-05-03 LAB — CBC WITH DIFFERENTIAL/PLATELET
BASO%: 0.4 % (ref 0.0–2.0)
Basophils Absolute: 0 10*3/uL (ref 0.0–0.1)
EOS%: 2.9 % (ref 0.0–7.0)
Eosinophils Absolute: 0.2 10*3/uL (ref 0.0–0.5)
HCT: 34.6 % — ABNORMAL LOW (ref 34.8–46.6)
HGB: 11.5 g/dL — ABNORMAL LOW (ref 11.6–15.9)
LYMPH#: 1.5 10*3/uL (ref 0.9–3.3)
LYMPH%: 21.5 % (ref 14.0–49.7)
MCH: 27.9 pg (ref 25.1–34.0)
MCHC: 33.1 g/dL (ref 31.5–36.0)
MCV: 84.1 fL (ref 79.5–101.0)
MONO#: 0.3 10*3/uL (ref 0.1–0.9)
MONO%: 4.6 % (ref 0.0–14.0)
NEUT%: 70.6 % (ref 38.4–76.8)
NEUTROS ABS: 5 10*3/uL (ref 1.5–6.5)
PLATELETS: 297 10*3/uL (ref 145–400)
RBC: 4.11 10*6/uL (ref 3.70–5.45)
RDW: 15.4 % — ABNORMAL HIGH (ref 11.2–14.5)
WBC: 7.1 10*3/uL (ref 3.9–10.3)

## 2017-05-03 NOTE — Progress Notes (Signed)
[place] OFFICE PROGRESS NOTE   Diagnosis: Gastrointestinal stromal tumor  INTERVAL HISTORY:   Ms. Friberg returns as scheduled.  She continues Gleevec.  No diarrhea.  She feels well.  Good appetite.  No complaint.  Objective:  Vital signs in last 24 hours:  Blood pressure (!) 148/88, pulse 72, temperature 98.1 F (36.7 C), temperature source Oral, resp. rate 20, height 5' 3.5" (1.613 m), weight 186 lb 14.4 oz (84.8 kg), SpO2 99 %.    HEENT: No thrush or ulcers Resp: Clear bilaterally Cardio: Regular rate and rhythm GI: No hepatomegaly, no mass, nontender, reducible right low abdominal hernia Vascular: No leg edema  Skin: No rash  Lab Results:  Lab Results  Component Value Date   WBC 7.1 05/03/2017   HGB 11.5 (L) 05/03/2017   HCT 34.6 (L) 05/03/2017   MCV 84.1 05/03/2017   PLT 297 05/03/2017   NEUTROABS 5.0 05/03/2017    CMP     Component Value Date/Time   NA 143 05/03/2017 1023   K 4.0 05/03/2017 1023   CL 108 10/05/2016 1610   CO2 26 05/03/2017 1023   GLUCOSE 102 05/03/2017 1023   GLUCOSE 109 (H) 04/26/2006 1434   BUN 11.7 05/03/2017 1023   CREATININE 1.1 05/03/2017 1023   CALCIUM 9.1 05/03/2017 1023   PROT 7.6 05/03/2017 1023   ALBUMIN 3.3 (L) 05/03/2017 1023   AST 12 05/03/2017 1023   ALT 10 05/03/2017 1023   ALKPHOS 113 05/03/2017 1023   BILITOT 0.96 05/03/2017 1023   GFRNONAA >60 02/01/2016 0633   GFRAA >60 02/01/2016 8850     Medications: I have reviewed the patient's current medications.   Assessment/Plan: 1. Gastrointestinal stromal tumor of the rectum  Status post an endoscopic ultrasound on 08/06/2014 confirming a mass abutting the distal rectal wall with an FNA biopsy confirming a gastrointestinal stromal tumor  Initiation of Gleevec 09/04/2014  MRI pelvis 12/15/2014 with interval decreased size of the large anorectal mass with central necrosis.  Continuation of Gleevec  Gleevec placed on hold 06/02/2015 in  anticipation of surgery  06/04/2015 status post low anterior resection and diverting loop ileostomy, resection of tumor  Pathology: 9.5 cm tumor; GISTsubtype spindle; mitotic rate 1/50 high-power field; positive margin of resection  Adjuvant Gleevec 08/19/2015 (three-year in total course planned)  Ileostomy takedown 01/28/2016  Held 01/28/2016 through 02/14/2016, resumed 02/15/2016  Gleevec stop 04/11/2016-04/18/2016, and 04/26/2016-05/08/2016  Gleevec resumed at a dose of 200 mg daily on 05/09/2016  Gleevec dose increased to 300 mg daily beginning 06/20/2016  Gleevec dose decreased to 200 mg daily beginning 06/23/2016 (poor tolerance at 300 mg dose) 2. Remote history of pulmonary embolism-maintained on apixaban 3. Multiple colon polyps noted on the colonoscopy 07/21/2014 with the pathology revealing tubular adenomas 4. Hospitalization 06/21/2015 through 07/06/2015 with nausea/vomiting, high ileostomy output; found to have delayed gastric emptying. 5. Water soluble contrast enema 08/13/2015 positive for a small to moderate volume of water soluble contrast leakage from the low anterior resection and anastomosis site in the pelvis 6. Ileostomy takedown and resection of cystic abdominal wall mass ( benign pathology) on 01/28/2016 7. Anemia with ferritin in the low normal range 05/23/2016 and 06/20/2016. Ferrous sulfate increased to twice daily 06/26/2016.Improved.  Disposition: Brianna Walker appears stable.  She will continue Gleevec.  She will return for an office and lab visit in 2 months.  15 minutes were spent with the patient today.  The majority of the time was used for counseling and coordination of care.  Betsy Coder, MD  05/03/2017  11:19 AM

## 2017-05-04 ENCOUNTER — Telehealth: Payer: Self-pay | Admitting: Oncology

## 2017-05-04 NOTE — Telephone Encounter (Signed)
Pt called in and scheduled appt per 1/3 los - Patient is aware of appts date and time.

## 2017-05-07 ENCOUNTER — Other Ambulatory Visit: Payer: Self-pay | Admitting: Oncology

## 2017-05-15 MED FILL — IMATINIB MESYLATE 100 MG TA: 100 | 30 days supply | Qty: 60 | Fill #0

## 2017-06-15 MED FILL — IMATINIB MESYLATE 100 MG TA: 100 | 30 days supply | Qty: 60 | Fill #1

## 2017-07-03 ENCOUNTER — Ambulatory Visit: Payer: Medicare HMO | Admitting: Nurse Practitioner

## 2017-07-03 ENCOUNTER — Other Ambulatory Visit: Payer: Medicare HMO

## 2017-07-11 ENCOUNTER — Other Ambulatory Visit: Payer: Self-pay | Admitting: Oncology

## 2017-07-17 MED FILL — IMATINIB MESYLATE 100 MG TA: 100 | 30 days supply | Qty: 60 | Fill #0

## 2017-07-19 ENCOUNTER — Inpatient Hospital Stay (HOSPITAL_BASED_OUTPATIENT_CLINIC_OR_DEPARTMENT_OTHER): Payer: Medicare HMO | Admitting: Nurse Practitioner

## 2017-07-19 ENCOUNTER — Inpatient Hospital Stay: Payer: Medicare HMO | Attending: Oncology

## 2017-07-19 ENCOUNTER — Encounter: Payer: Self-pay | Admitting: Nurse Practitioner

## 2017-07-19 VITALS — BP 138/90 | HR 60 | Temp 97.8°F | Resp 18 | Ht 63.5 in | Wt 191.0 lb

## 2017-07-19 DIAGNOSIS — C49A4 Gastrointestinal stromal tumor of large intestine: Secondary | ICD-10-CM | POA: Diagnosis not present

## 2017-07-19 DIAGNOSIS — R21 Rash and other nonspecific skin eruption: Secondary | ICD-10-CM | POA: Diagnosis not present

## 2017-07-19 LAB — COMPREHENSIVE METABOLIC PANEL
ALBUMIN: 3.2 g/dL — AB (ref 3.5–5.0)
ALK PHOS: 109 U/L (ref 40–150)
ALT: <6 U/L (ref 0–55)
ANION GAP: 7 (ref 3–11)
AST: 12 U/L (ref 5–34)
BUN: 13 mg/dL (ref 7–26)
CALCIUM: 9 mg/dL (ref 8.4–10.4)
CHLORIDE: 111 mmol/L — AB (ref 98–109)
CO2: 23 mmol/L (ref 22–29)
Creatinine, Ser: 1.04 mg/dL (ref 0.60–1.10)
GFR calc Af Amer: >60 mL/min (ref >60)
GFR calc non Af Amer: 54 mL/min — ABNORMAL LOW (ref >60)
GLUCOSE: 90 mg/dL (ref 70–140)
Potassium: 4 mmol/L (ref 3.5–5.1)
SODIUM: 141 mmol/L (ref 136–145)
Total Bilirubin: 0.6 mg/dL (ref 0.2–1.2)
Total Protein: 7.2 g/dL (ref 6.4–8.3)

## 2017-07-19 LAB — CBC WITH DIFFERENTIAL/PLATELET
BASOS PCT: 1 %
Basophils Absolute: 0 10*3/uL (ref 0.0–0.1)
EOS ABS: 0.1 10*3/uL (ref 0.0–0.5)
Eosinophils Relative: 3 %
HCT: 35.4 % (ref 34.8–46.6)
HEMOGLOBIN: 11.8 g/dL (ref 11.6–15.9)
Lymphocytes Relative: 21 %
Lymphs Abs: 1.1 10*3/uL (ref 0.9–3.3)
MCH: 28.2 pg (ref 25.1–34.0)
MCHC: 33.2 g/dL (ref 31.5–36.0)
MCV: 85.1 fL (ref 79.5–101.0)
MONOS PCT: 4 %
Monocytes Absolute: 0.2 10*3/uL (ref 0.1–0.9)
NEUTROS PCT: 71 %
Neutro Abs: 3.7 10*3/uL (ref 1.5–6.5)
PLATELETS: 321 10*3/uL (ref 145–400)
RBC: 4.16 MIL/uL (ref 3.70–5.45)
RDW: 15 % — ABNORMAL HIGH (ref 11.2–14.5)
WBC: 5.1 10*3/uL (ref 3.9–10.3)

## 2017-07-19 MED ORDER — NYSTATIN 100000 UNIT/GM EX POWD: Freq: Three times a day (TID) | CUTANEOUS | 0 refills | Status: DC

## 2017-07-19 NOTE — Progress Notes (Signed)
  [place] OFFICE PROGRESS NOTE   Diagnosis: Gastrointestinal stromal tumor  INTERVAL HISTORY:   Brianna Walker returns as scheduled.  She continues Gleevec.  She denies nausea/vomiting.  No mouth sores.  No diarrhea.  She reports a good appetite.  She is gaining weight.  She has periodic cramps in the hands and legs.  She has noticed a pruritic rash beneath both breasts.  No other rash.  Objective:  Vital signs in last 24 hours:  Blood pressure 138/90, pulse 60, temperature 97.8 F (36.6 C), temperature source Oral, resp. rate 18, height 5' 3.5" (1.613 m), weight 191 lb (86.6 kg).    HEENT: No thrush or ulcers. Resp: Lungs clear bilaterally. Cardio: Regular rate and rhythm. GI: Abdomen soft and nontender.  No hepatomegaly.  Reducible right abdominal hernia. Vascular: No leg edema. Skin: Erythematous well-demarcated rash beneath both breasts.   Lab Results:  Lab Results  Component Value Date   WBC 5.1 07/19/2017   HGB 11.8 07/19/2017   HCT 35.4 07/19/2017   MCV 85.1 07/19/2017   PLT 321 07/19/2017   NEUTROABS 3.7 07/19/2017    Imaging:  No results found.  Medications: I have reviewed the patient's current medications.  Assessment/Plan: 1. Gastrointestinal stromal tumor of the rectum  Status post an endoscopic ultrasound on 08/06/2014 confirming a mass abutting the distal rectal wall with an FNA biopsy confirming a gastrointestinal stromal tumor  Initiation of Gleevec 09/04/2014  MRI pelvis 12/15/2014 with interval decreased size of the large anorectal mass with central necrosis.  Continuation of Gleevec  Gleevec placed on hold 06/02/2015 in anticipation of surgery  06/04/2015 status post low anterior resection and diverting loop ileostomy, resection of tumor  Pathology: 9.5 cm tumor; GISTsubtype spindle; mitotic rate 1/50 high-power field; positive margin of resection  Adjuvant Gleevec 08/19/2015 (three-year in total course  planned)  Ileostomy takedown 01/28/2016  Held 01/28/2016 through 02/14/2016, resumed 02/15/2016  Gleevec stop 04/11/2016-04/18/2016, and 04/26/2016-05/08/2016  Gleevec resumed at a dose of 200 mg daily on 05/09/2016  Gleevec dose increased to 300 mg daily beginning 06/20/2016  Gleevec dose decreased to 200 mg daily beginning 06/23/2016 (poor tolerance at 300 mg dose) 2. Remote history of pulmonary embolism-maintained on apixaban 3. Multiple colon polyps noted on the colonoscopy 07/21/2014 with the pathology revealing tubular adenomas 4. Hospitalization 06/21/2015 through 07/06/2015 with nausea/vomiting, high ileostomy output; found to have delayed gastric emptying. 5. Water soluble contrast enema 08/13/2015 positive for a small to moderate volume of water soluble contrast leakage from the low anterior resection and anastomosis site in the pelvis 6. Ileostomy takedown and resection of cystic abdominal wall mass ( benign pathology) on 01/28/2016 7. Anemia with ferritin in the low normal range 05/23/2016 and 06/20/2016. Ferrous sulfate increased to twice daily 06/26/2016.Improved.    Disposition: [name] appears stable.  She will continue Gleevec.  The rash beneath the breasts is consistent with a yeast infection.  A prescription for nystatin powder was sent to her pharmacy.  She will return for lab and follow-up in approximately 2-1/2 months.  She will contact the office in the interim with any problems.    Ned Card ANP/GNP-BC   07/19/2017  11:48 AM

## 2017-08-08 ENCOUNTER — Other Ambulatory Visit: Payer: Self-pay | Admitting: Oncology

## 2017-08-10 MED FILL — IMATINIB MESYLATE 100 MG TA: 100 | 30 days supply | Qty: 60 | Fill #0

## 2017-08-15 ENCOUNTER — Telehealth: Payer: Self-pay

## 2017-08-15 NOTE — Telephone Encounter (Signed)
Returned call to pt. Pt states "I need something for gas, it smells foul and is very offensive. I can't control it". Pt has states an increase in gas over the past 3 months. Made MD aware. Pt can alter diet to reduce or eliminate gas producing foods. Pt voiced understanding

## 2017-08-28 ENCOUNTER — Other Ambulatory Visit: Payer: Medicare HMO

## 2017-08-28 ENCOUNTER — Ambulatory Visit: Payer: Medicare HMO | Admitting: Nurse Practitioner

## 2017-08-29 ENCOUNTER — Other Ambulatory Visit: Payer: Self-pay | Admitting: Internal Medicine

## 2017-08-31 ENCOUNTER — Ambulatory Visit: Payer: Medicare HMO | Admitting: Nurse Practitioner

## 2017-08-31 ENCOUNTER — Other Ambulatory Visit: Payer: Medicare HMO

## 2017-09-03 ENCOUNTER — Other Ambulatory Visit: Payer: Self-pay | Admitting: Oncology

## 2017-09-06 MED FILL — IMATINIB MESYLATE 100 MG TA: 100 | 30 days supply | Qty: 60 | Fill #0

## 2017-09-27 ENCOUNTER — Inpatient Hospital Stay (HOSPITAL_BASED_OUTPATIENT_CLINIC_OR_DEPARTMENT_OTHER): Payer: Medicare HMO | Admitting: Oncology

## 2017-09-27 ENCOUNTER — Telehealth: Payer: Self-pay | Admitting: Oncology

## 2017-09-27 ENCOUNTER — Inpatient Hospital Stay: Payer: Medicare HMO | Attending: Oncology

## 2017-09-27 VITALS — BP 138/72 | HR 62 | Temp 98.1°F | Resp 18 | Ht 63.5 in | Wt 192.2 lb

## 2017-09-27 DIAGNOSIS — C49A4 Gastrointestinal stromal tumor of large intestine: Secondary | ICD-10-CM

## 2017-09-27 DIAGNOSIS — Z86711 Personal history of pulmonary embolism: Secondary | ICD-10-CM

## 2017-09-27 LAB — CMP (CANCER CENTER ONLY)
ALBUMIN: 3.5 g/dL (ref 3.5–5.0)
ALT: 9 U/L (ref 0–55)
AST: 15 U/L (ref 5–34)
Alkaline Phosphatase: 104 U/L (ref 40–150)
Anion gap: 7 (ref 3–11)
BILIRUBIN TOTAL: 0.6 mg/dL (ref 0.2–1.2)
BUN: 13 mg/dL (ref 7–26)
CO2: 26 mmol/L (ref 22–29)
CREATININE: 1.12 mg/dL — AB (ref 0.60–1.10)
Calcium: 9.1 mg/dL (ref 8.4–10.4)
Chloride: 109 mmol/L (ref 98–109)
GFR, EST NON AFRICAN AMERICAN: 49 mL/min — AB (ref >60)
GFR, Est AFR Am: 57 mL/min — ABNORMAL LOW (ref >60)
GLUCOSE: 99 mg/dL (ref 70–140)
POTASSIUM: 4 mmol/L (ref 3.5–5.1)
Sodium: 142 mmol/L (ref 136–145)
TOTAL PROTEIN: 7.6 g/dL (ref 6.4–8.3)

## 2017-09-27 LAB — CBC WITH DIFFERENTIAL (CANCER CENTER ONLY)
BASOS ABS: 0 10*3/uL (ref 0.0–0.1)
Basophils Relative: 0 %
EOS ABS: 0.1 10*3/uL (ref 0.0–0.5)
EOS PCT: 2 %
HCT: 36.5 % (ref 34.8–46.6)
Hemoglobin: 12 g/dL (ref 11.6–15.9)
LYMPHS ABS: 1.5 10*3/uL (ref 0.9–3.3)
LYMPHS PCT: 23 %
MCH: 28.3 pg (ref 25.1–34.0)
MCHC: 32.9 g/dL (ref 31.5–36.0)
MCV: 86.1 fL (ref 79.5–101.0)
MONO ABS: 0.2 10*3/uL (ref 0.1–0.9)
Monocytes Relative: 4 %
Neutro Abs: 4.6 10*3/uL (ref 1.5–6.5)
Neutrophils Relative %: 71 %
PLATELETS: 277 10*3/uL (ref 145–400)
RBC: 4.24 MIL/uL (ref 3.70–5.45)
RDW: 14.6 % — AB (ref 11.2–14.5)
WBC: 6.5 10*3/uL (ref 3.9–10.3)

## 2017-09-27 NOTE — Telephone Encounter (Signed)
Appointments scheduled AVS/Calendar printed/ contrast material provided w/ instructions and contact number per 5/30 los

## 2017-09-27 NOTE — Progress Notes (Signed)
[place] OFFICE PROGRESS NOTE   Diagnosis: Gastrointestinal stromal tumor  INTERVAL HISTORY:   Brianna Walker returns as scheduled.  She continues Gleevec.  No diarrhea, swelling, or rash.  She feels well.  Objective:  Vital signs in last 24 hours:  Blood pressure 138/72, pulse 62, temperature 98.1 F (36.7 C), temperature source Oral, resp. rate 18, height 5' 3.5" (1.613 m), weight 192 lb 3.2 oz (87.2 kg), SpO2 100 %.    HEENT: No thrush or ulcers Resp: Lungs clear bilaterally Cardio: Regular rate and rhythm GI: No hepatospleno megaly, no mass, reducible right lower quadrant hernia Vascular: Leg edema   Lab Results:  Lab Results  Component Value Date   WBC 6.5 09/27/2017   HGB 12.0 09/27/2017   HCT 36.5 09/27/2017   MCV 86.1 09/27/2017   PLT 277 09/27/2017   NEUTROABS 4.6 09/27/2017    CMP  Lab Results  Component Value Date   NA 142 09/27/2017   K 4.0 09/27/2017   CL 109 09/27/2017   CO2 26 09/27/2017   GLUCOSE 99 09/27/2017   BUN 13 09/27/2017   CREATININE 1.12 (H) 09/27/2017   CALCIUM 9.1 09/27/2017   PROT 7.6 09/27/2017   ALBUMIN 3.5 09/27/2017   AST 15 09/27/2017   ALT 9 09/27/2017   ALKPHOS 104 09/27/2017   BILITOT 0.6 09/27/2017   GFRNONAA 49 (L) 09/27/2017   GFRAA 57 (L) 09/27/2017    Medications: I have reviewed the patient's current medications.   Assessment/Plan: 1. Gastrointestinal stromal tumor of the rectum  Status post an endoscopic ultrasound on 08/06/2014 confirming a mass abutting the distal rectal wall with an FNA biopsy confirming a gastrointestinal stromal tumor  Initiation of Gleevec 09/04/2014  MRI pelvis 12/15/2014 with interval decreased size of the large anorectal mass with central necrosis.  Continuation of Gleevec  Gleevec placed on hold 06/02/2015 in anticipation of surgery  06/04/2015 status post low anterior resection and diverting loop ileostomy, resection of tumor  Pathology: 9.5 cm tumor;  GISTsubtype spindle; mitotic rate 1/50 high-power field; positive margin of resection  Adjuvant Gleevec 08/19/2015 (three-year in total course planned)  Ileostomy takedown 01/28/2016  Held 01/28/2016 through 02/14/2016, resumed 02/15/2016  Gleevec stop 04/11/2016-04/18/2016, and 04/26/2016-05/08/2016  Gleevec resumed at a dose of 200 mg daily on 05/09/2016  Gleevec dose increased to 300 mg daily beginning 06/20/2016  Gleevec dose decreased to 200 mg daily beginning 06/23/2016 (poor tolerance at 300 mg dose) 2. Remote history of pulmonary embolism-maintained on apixaban 3. Multiple colon polyps noted on the colonoscopy 07/21/2014 with the pathology revealing tubular adenomas 4. Hospitalization 06/21/2015 through 07/06/2015 with nausea/vomiting, high ileostomy output; found to have delayed gastric emptying. 5. Water soluble contrast enema 08/13/2015 positive for a small to moderate volume of water soluble contrast leakage from the low anterior resection and anastomosis site in the pelvis 6. Ileostomy takedown and resection of cystic abdominal wall mass ( benign pathology) on 01/28/2016 7. Anemia with ferritin in the low normal range 05/23/2016 and 06/20/2016. Ferrous sulfate increased to twice daily 06/26/2016.Improved.  Disposition: Brianna Walker appears stable.  She is tolerating the [place] well.  She has been maintained on [place] for 3 years with treatment breaks.  She will continue Gleevec.  She will be scheduled for a restaging CT evaluation prior to an office visit in 2 months.  We will make a decision on continuing [place] when she is here in 2 months.  15 minutes were spent with the patient today.  The majority of  the time was used for counseling and coordination of care.  Betsy Coder, MD  09/27/2017  11:30 AM

## 2017-09-28 ENCOUNTER — Other Ambulatory Visit: Payer: Self-pay | Admitting: Oncology

## 2017-10-01 MED FILL — IMATINIB MESYLATE 100 MG TA: 100 | 30 days supply | Qty: 60 | Fill #0

## 2017-10-09 ENCOUNTER — Telehealth: Payer: Self-pay

## 2017-10-09 NOTE — Telephone Encounter (Signed)
Called pt, LVM.   

## 2017-10-09 NOTE — Telephone Encounter (Signed)
Copied from [place] [id number]. Topic: General - Other >> Oct 09, 2017 10:26 AM Brianna Walker wrote:  Pt call to say she think the below med is causing her to have cramps and is asking if the dosage can be lowered.Would like a call back   [phone]

## 2017-10-09 NOTE — Telephone Encounter (Signed)
I think she may be referring to pravastatin 20 mg  If this is true, OK to try taking the pravastatin 20 mg every other day (since there is no lower dose)

## 2017-10-09 NOTE — Telephone Encounter (Signed)
Pt returning call. Per [facility] nurse not available. Please call back on home # [phone] .

## 2017-10-10 NOTE — Telephone Encounter (Signed)
Called pt, LVM.   

## 2017-10-11 NOTE — Telephone Encounter (Signed)
Pt. Given Dr.John's recommendation. Verbalizes understanding.

## 2017-10-31 ENCOUNTER — Other Ambulatory Visit: Payer: Self-pay | Admitting: Oncology

## 2017-11-05 MED FILL — IMATINIB MESYLATE 100 MG TA: 100 | 30 days supply | Qty: 60 | Fill #0

## 2017-11-22 ENCOUNTER — Ambulatory Visit (HOSPITAL_COMMUNITY)
Admission: RE | Admit: 2017-11-22 | Discharge: 2017-11-22 | Disposition: A | Discharge status: Home / Self Care | Payer: Medicare HMO | Source: Ambulatory Visit | Attending: Oncology | Admitting: Oncology

## 2017-11-22 ENCOUNTER — Inpatient Hospital Stay: Payer: Medicare HMO | Attending: Oncology

## 2017-11-22 DIAGNOSIS — C774 Secondary and unspecified malignant neoplasm of inguinal and lower limb lymph nodes: Secondary | ICD-10-CM | POA: Diagnosis not present

## 2017-11-22 DIAGNOSIS — C49A0 Gastrointestinal stromal tumor, unspecified site: Secondary | ICD-10-CM | POA: Diagnosis not present

## 2017-11-22 DIAGNOSIS — C49A4 Gastrointestinal stromal tumor of large intestine: Secondary | ICD-10-CM

## 2017-11-22 LAB — CBC WITH DIFFERENTIAL (CANCER CENTER ONLY)
BASOS ABS: 0 10*3/uL (ref 0.0–0.1)
BASOS PCT: 0 %
Eosinophils Absolute: 0.1 10*3/uL (ref 0.0–0.5)
Eosinophils Relative: 2 %
HEMATOCRIT: 36.3 % (ref 34.8–46.6)
Hemoglobin: 11.8 g/dL (ref 11.6–15.9)
LYMPHS PCT: 19 %
Lymphs Abs: 1.3 10*3/uL (ref 0.9–3.3)
MCH: 27.6 pg (ref 25.1–34.0)
MCHC: 32.5 g/dL (ref 31.5–36.0)
MCV: 85 fL (ref 79.5–101.0)
MONO ABS: 0.4 10*3/uL (ref 0.1–0.9)
Monocytes Relative: 6 %
NEUTROS ABS: 5 10*3/uL (ref 1.5–6.5)
Neutrophils Relative %: 73 %
Platelet Count: 296 10*3/uL (ref 145–400)
RBC: 4.27 MIL/uL (ref 3.70–5.45)
RDW: 14.8 % — AB (ref 11.2–14.5)
WBC Count: 6.9 10*3/uL (ref 3.9–10.3)

## 2017-11-22 LAB — CMP (CANCER CENTER ONLY)
ALK PHOS: 123 U/L (ref 38–126)
ANION GAP: 7 (ref 5–15)
AST: 12 U/L — ABNORMAL LOW (ref 15–41)
Albumin: 3.5 g/dL (ref 3.5–5.0)
BUN: 18 mg/dL (ref 8–23)
CO2: 26 mmol/L (ref 22–32)
CREATININE: 1.25 mg/dL — AB (ref 0.44–1.00)
Calcium: 9.1 mg/dL (ref 8.9–10.3)
Chloride: 106 mmol/L (ref 98–111)
GFR, EST AFRICAN AMERICAN: 50 mL/min — AB (ref >60)
GFR, Estimated: 43 mL/min — ABNORMAL LOW (ref >60)
Glucose, Bld: 80 mg/dL (ref 70–99)
Potassium: 4.4 mmol/L (ref 3.5–5.1)
Sodium: 139 mmol/L (ref 135–145)
Total Bilirubin: 0.6 mg/dL (ref 0.3–1.2)
Total Protein: 7.8 g/dL (ref 6.5–8.1)

## 2017-11-22 MED ORDER — IOPAMIDOL (ISOVUE-300) INJECTION 61%
100.0000 mL | Freq: Once | INTRAVENOUS | Status: AC | PRN
  Administered 2017-11-22: 100 mL via INTRAVENOUS

## 2017-11-22 MED ORDER — IOPAMIDOL (ISOVUE-300) INJECTION 61%: 30.0000 mL | Freq: Once | INTRAVENOUS | Status: DC | PRN

## 2017-11-22 MED ORDER — IOPAMIDOL (ISOVUE-300) INJECTION 61%
INTRAVENOUS | Status: AC
  Filled 2017-11-22: qty 30

## 2017-11-22 MED ORDER — IOPAMIDOL (ISOVUE-300) INJECTION 61%
INTRAVENOUS | Status: AC
  Filled 2017-11-22: qty 100

## 2017-11-27 ENCOUNTER — Other Ambulatory Visit: Payer: Self-pay | Admitting: Oncology

## 2017-11-28 ENCOUNTER — Other Ambulatory Visit: Payer: Self-pay | Admitting: Internal Medicine

## 2017-11-29 ENCOUNTER — Telehealth: Payer: Self-pay

## 2017-11-29 ENCOUNTER — Encounter: Payer: Self-pay | Admitting: Nurse Practitioner

## 2017-11-29 ENCOUNTER — Inpatient Hospital Stay: Payer: Medicare HMO | Attending: Oncology | Admitting: Nurse Practitioner

## 2017-11-29 VITALS — BP 142/80 | HR 93 | Temp 98.6°F | Resp 18 | Ht 63.5 in | Wt 196.7 lb

## 2017-11-29 DIAGNOSIS — K625 Hemorrhage of anus and rectum: Secondary | ICD-10-CM | POA: Diagnosis not present

## 2017-11-29 DIAGNOSIS — C49A4 Gastrointestinal stromal tumor of large intestine: Secondary | ICD-10-CM | POA: Insufficient documentation

## 2017-11-29 MED FILL — IMATINIB MESYLATE 100 MG TA: 100 | 30 days supply | Qty: 60 | Fill #0

## 2017-11-29 NOTE — Progress Notes (Addendum)
[facility] OFFICE PROGRESS NOTE   Diagnosis: Gastrointestinal stromal tumor  INTERVAL HISTORY:   Brianna Walker returns as scheduled.  She continues Gleevec.  She feels well.  No nausea or vomiting.  No mouth sores.  No diarrhea.  No rash.  She denies pain.  She has good appetite.  She is gaining weight.  No fever, cough, shortness of breath.  She reports intermittent rectal bleeding over the past 2 months.  The bleeding occurs with a bowel movement.  The blood has been bright red and also dark.  She has a history of hemorrhoids and wonders if the blood could be related to this.  Objective:  Vital signs in last 24 hours:  Pulse 93, temperature 98.6 F (37 C), temperature source Oral, resp. rate 18, height 5' 3.5" (1.613 m), weight 196 lb 11.2 oz (89.2 kg), SpO2 99 %.    HEENT: No thrush or ulcers. Resp: Lungs clear bilaterally. Cardio: Regular rate and rhythm. GI: Abdomen soft and nontender.  No hepatospleno megaly.  No mass.  Right lower quadrant hernia. Vascular: No leg edema.  Skin: No rash.   Lab Results:  Lab Results  Component Value Date   WBC 6.9 11/22/2017   HGB 11.8 11/22/2017   HCT 36.3 11/22/2017   MCV 85.0 11/22/2017   PLT 296 11/22/2017   NEUTROABS 5.0 11/22/2017    Imaging:  No results found.  Medications: I have reviewed the patient's current medications.  Assessment/Plan: 1. Gastrointestinal stromal tumor of the rectum  Status post an endoscopic ultrasound on 08/06/2014 confirming a mass abutting the distal rectal wall with an FNA biopsy confirming a gastrointestinal stromal tumor  Initiation of Gleevec 09/04/2014  MRI pelvis 12/15/2014 with interval decreased size of the large anorectal mass with central necrosis.  Continuation of Gleevec  Gleevec placed on hold 06/02/2015 in anticipation of surgery  06/04/2015 status post low anterior resection and diverting loop ileostomy, resection of tumor  Pathology: 9.5 cm tumor;  GISTsubtype spindle; mitotic rate 1/50 high-power field; positive margin of resection  Adjuvant Gleevec 08/19/2015 (three-year in total course planned)  Ileostomy takedown 01/28/2016  Held 01/28/2016 through 02/14/2016, resumed 02/15/2016  Gleevec stop 04/11/2016-04/18/2016, and 04/26/2016-05/08/2016  Gleevec resumed at a dose of 200 mg daily on 05/09/2016  Gleevec dose increased to 300 mg daily beginning 06/20/2016  Gleevec dose decreased to 200 mg daily beginning 06/23/2016 (poor tolerance at 300 mg dose)  CTs 11/22/2017- enhancing soft tissue mass in the presacral space measuring 5.4 x 3.5 cm.  Enlarged pelvic lymph nodes. 2. Remote history of pulmonary embolism-maintained on apixaban 3. Multiple colon polyps noted on the colonoscopy 07/21/2014 with the pathology revealing tubular adenomas 4. Hospitalization 06/21/2015 through 07/06/2015 with nausea/vomiting, high ileostomy output; found to have delayed gastric emptying. 5. Water soluble contrast enema 08/13/2015 positive for a small to moderate volume of water soluble contrast leakage from the low anterior resection and anastomosis site in the pelvis 6. Ileostomy takedown and resection of cystic abdominal wall mass ( benign pathology) on 01/28/2016 7. Anemia with ferritin in the low normal range 05/23/2016 and 06/20/2016. Ferrous sulfate increased to twice daily 06/26/2016.Improved.     Disposition: Brianna Walker appears stable.  She is currently on Gleevec 200 mg daily.  The recent CT scan showed a soft tissue mass in the presacral space.  Dr. Benay Spice reviewed the report/images with Brianna Walker at today's visit. The current plan is to repeat a CT scan at an approximate 40-month interval.  Her case will  be presented at the upcoming GI tumor conference.  With regard to the intermittent rectal bleeding over the past several months we are referring her to Dr. Fuller Plan for evaluation.  She will return for a follow-up visit here in 6 weeks  with labs.  Patient seen with Dr. Benay Spice.  25 minutes were spent face-to-face at today's visit with the majority of that time involved in counseling/coordination of care.    Ned Card ANP/GNP-BC   11/29/2017  10:40 AM This was a shared visit with Ned Card.  We reviewed the CT images with Brianna Walker.  The CT findings are concerning for progression of the GIST.  I will present her case at the GI conference. She will continue gleevec.  Julieanne Manson, MD

## 2017-11-29 NOTE — Telephone Encounter (Signed)
Printed avs and calender of upcoming appointment. Per 8/1 los 

## 2017-12-12 IMAGING — MR MR HEAD W/O CM
9 of 12 series · 33 of 48 positions shown · non-contrast
Comparison: Brain MRI 02/23/2011

CLINICAL DATA: Double vision for 10 days.

EXAM:
MRI HEAD WITHOUT CONTRAST
MRA HEAD WITHOUT CONTRAST
TECHNIQUE: Multiplanar, multiecho pulse sequences of the brain and surrounding
structures were obtained without intravenous contrast. Angiographic
images of the head were obtained using MRA technique without
contrast.

[Series 3: T1 · sagittal · 5.0mm · 0.47mm/px · 2 of 24 slices shown]
[im 1/24]
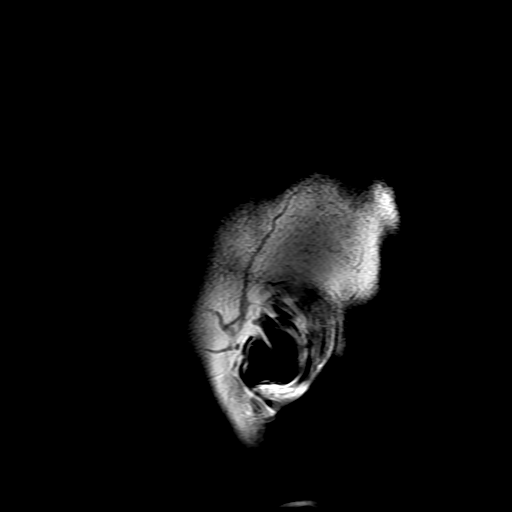
[im 24/24]
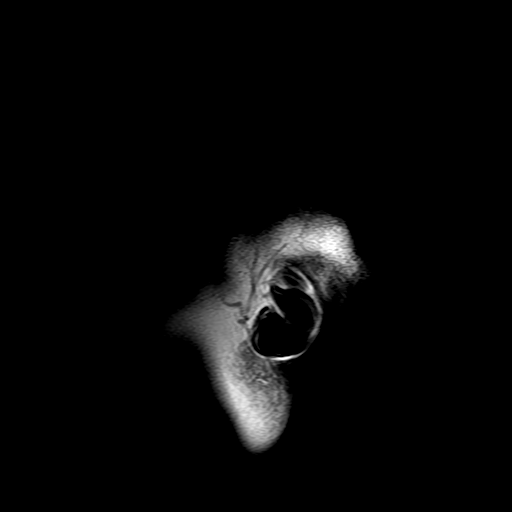

[Series 4: DWI · axial · 3.0mm · 1.09mm/px · z∈[-57,+72]mm · 8 of 90 slices shown (1 of 4)]
[im 1/90]
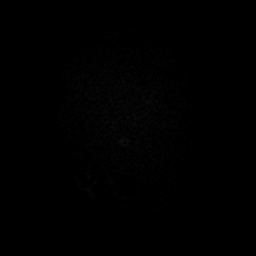
[im 13/90]
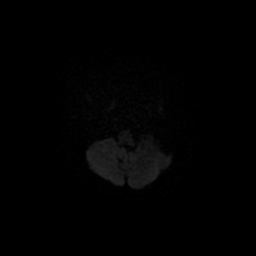
[im 26/90]
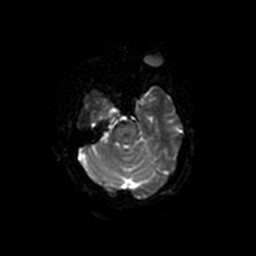
[im 39/90]
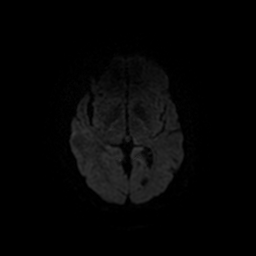
[im 51/90]
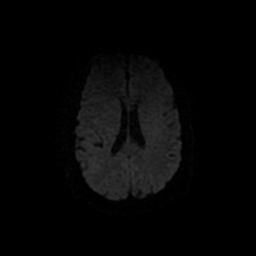
[im 64/90]
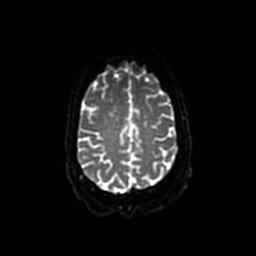
[im 77/90]
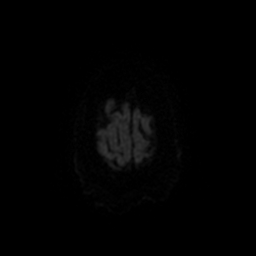
[im 90/90]
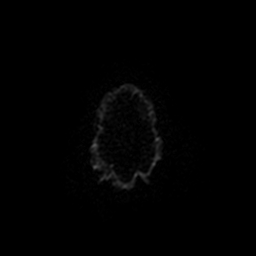

[Series 5: DWI · coronal · 5.0mm · 1.09mm/px · 6 of 72 slices shown (2 of 4)]
[im 1/72]
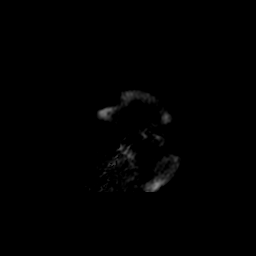
[im 15/72]
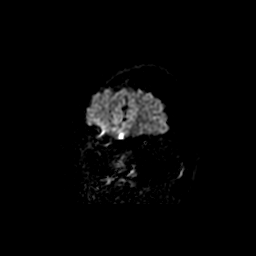
[im 29/72]
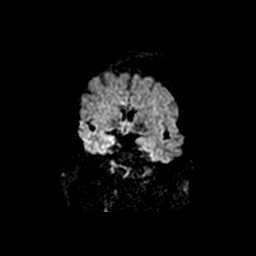
[im 43/72]
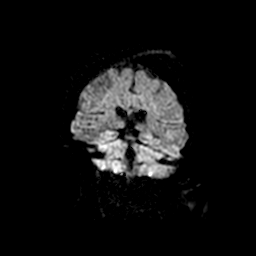
[im 57/72]
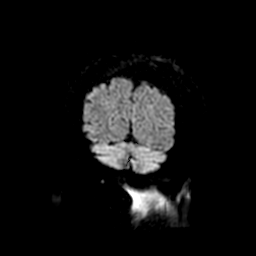
[im 72/72]
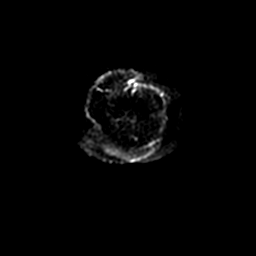

[Series 6: (id) mt fs · axial · 1.4mm · 0.39mm/px · z∈[-47,-2]mm · 4 of 141 slices shown]
[im 1/141]
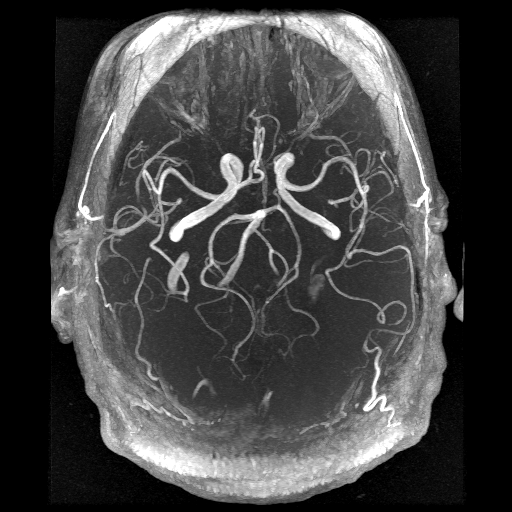
[im 26/141]
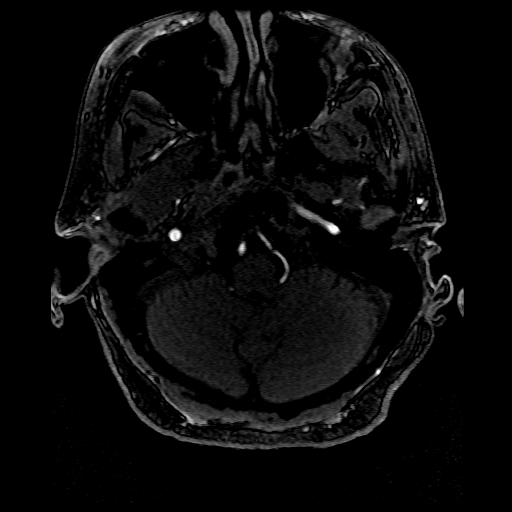
[im 39/141]
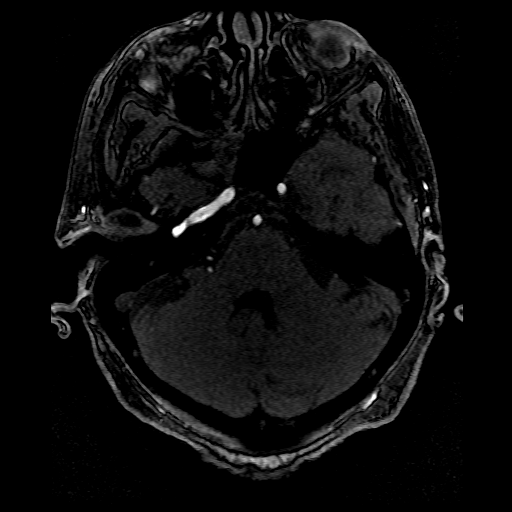
[im 64/141]
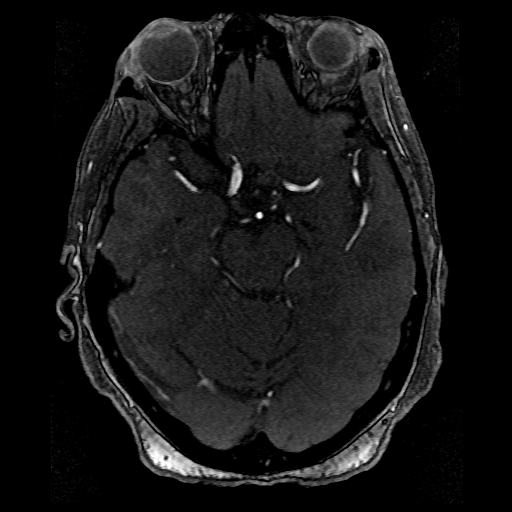

[Series 7: T2 · axial · 5.0mm · 0.43mm/px · z∈[-64,+69]mm · 2 of 23 slices shown (1 of 2)]
[im 1/23]
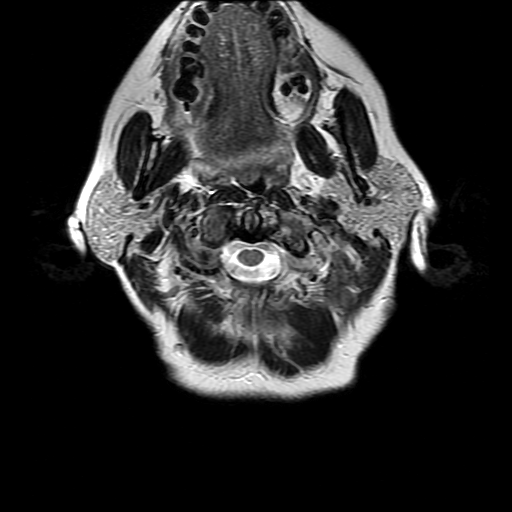
[im 23/23]
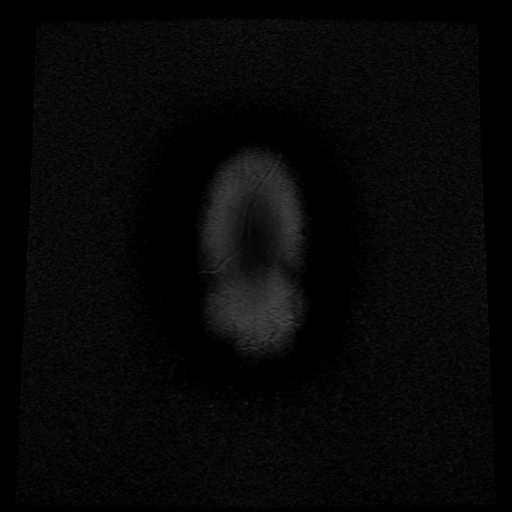

[Series 8: FLAIR · axial · 5.0mm · 0.43mm/px · z∈[-69,+74]mm · 2 of 23 slices shown]
[im 1/23]
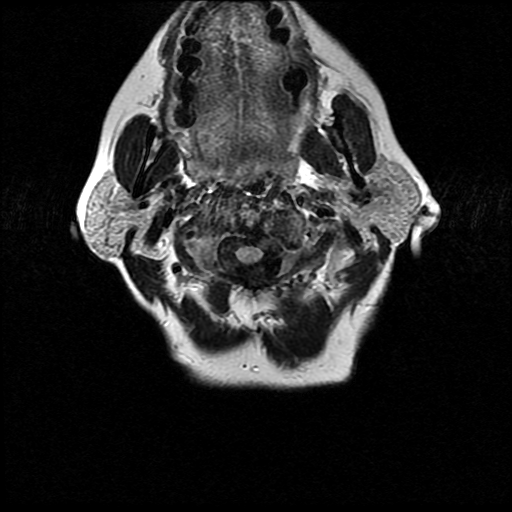
[im 23/23]
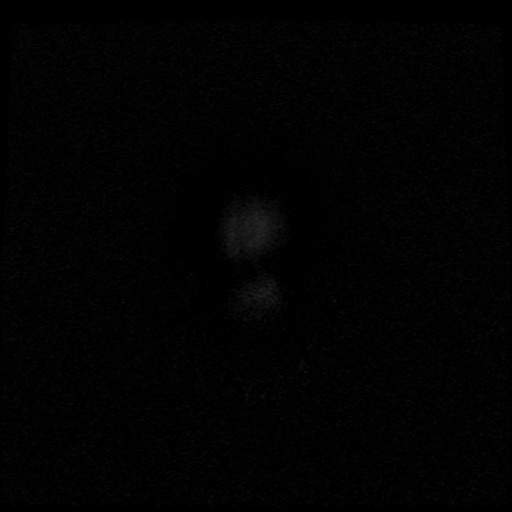

[Series 11: T2 · coronal · 5.0mm · 0.45mm/px · 2 of 29 slices shown (2 of 2)]
[im 1/29]
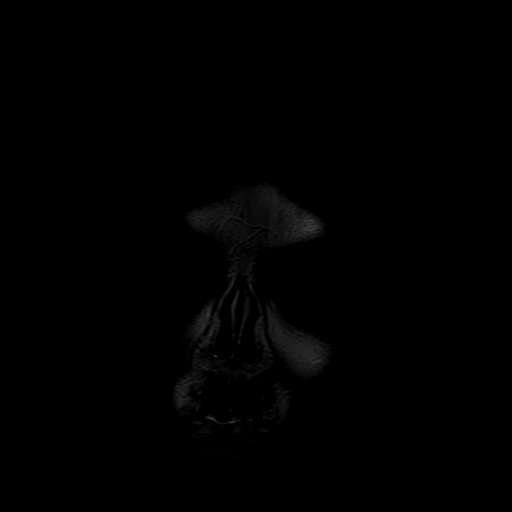
[im 29/29]
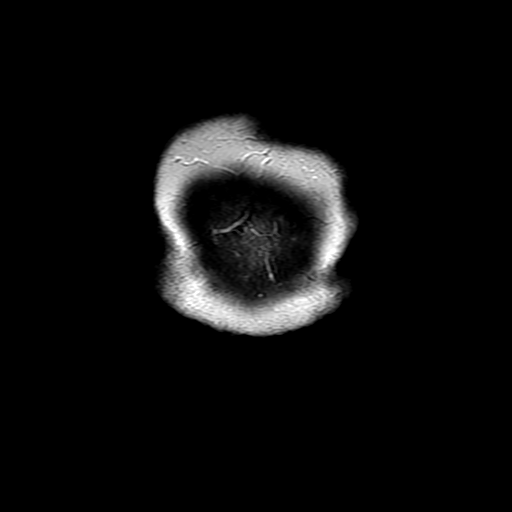

[Series 400: DWI · axial · 3.0mm · 1.09mm/px · z∈[-57,+72]mm · 4 of 45 slices shown (3 of 4)]
[im 1/45]
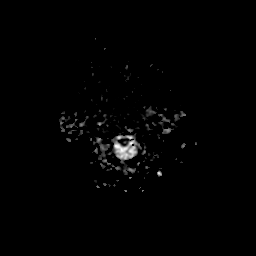
[im 15/45]
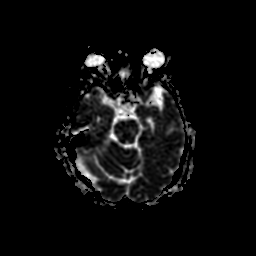
[im 30/45]
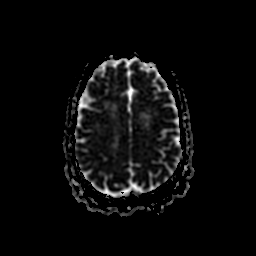
[im 45/45]
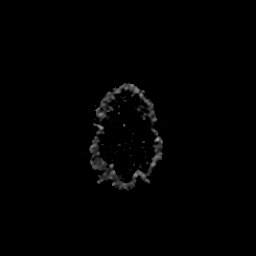

[Series 500: DWI · coronal · 5.0mm · 1.09mm/px · 3 of 36 slices shown (4 of 4)]
[im 1/36]
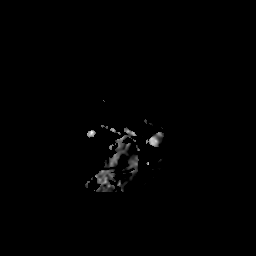
[im 18/36]
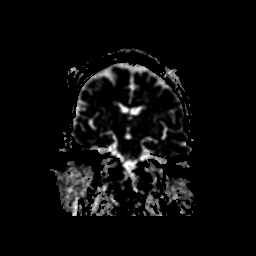
[im 36/36]
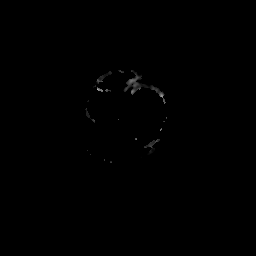

[33 of 48 positions shown; findings below may reference images not displayed]

FINDINGS: MRI HEAD FINDINGS

A partially empty sella is again seen. There is no evidence of acute
infarct, intracranial hemorrhage, mass, midline shift, or
extra-axial fluid collection. Ventricles and sulci are within normal
limits for age. Patchy to confluent T2 hyperintensities in the
subcortical and deep cerebral white matter and pons have progressed
from the prior MRI and are nonspecific but compatible with moderate
chronic small vessel ischemic disease. Chronic small vessel ischemic
changes are also seen in the deep gray nuclei bilaterally.

Orbits are unremarkable. A small right maxillary sinus mucous
retention cyst and chronic right sphenoid sinusitis are again seen.
The mastoid air cells are clear. Major intracranial vascular flow
voids are preserved.

MRA HEAD FINDINGS

The visualized distal vertebral arteries are widely patent with the
right being dominant. PICA and SCA origins are patent. Basilar
artery is patent without stenosis. There is a patent right posterior
communicating artery. PCAs are patent without significant stenosis.

Internal carotid arteries are patent from skullbase to carotid
termini without stenosis. The left A1 segment is absent. The right
A1 segment is widely patent and supplies the left ACA. MCAs are
patent without evidence of major branch occlusion or significant
stenosis. No intracranial aneurysm is identified.
IMPRESSION: 1. No acute intracranial abnormality.
2. Moderate chronic small vessel ischemic disease, progressed from
3. Unremarkable head MRA aside from normal variant anatomy.

## 2017-12-26 ENCOUNTER — Other Ambulatory Visit: Payer: Self-pay | Admitting: Oncology

## 2018-01-01 MED FILL — IMATINIB MESYLATE 100 MG TA: 100 | 30 days supply | Qty: 60 | Fill #0

## 2018-01-04 DIAGNOSIS — H43821 Vitreomacular adhesion, right eye: Secondary | ICD-10-CM | POA: Diagnosis not present

## 2018-01-04 DIAGNOSIS — H4901 Third [oculomotor] nerve palsy, right eye: Secondary | ICD-10-CM | POA: Diagnosis not present

## 2018-01-11 ENCOUNTER — Inpatient Hospital Stay: Payer: Medicare HMO | Attending: Oncology | Admitting: Oncology

## 2018-01-11 ENCOUNTER — Telehealth: Payer: Self-pay

## 2018-01-11 ENCOUNTER — Inpatient Hospital Stay: Payer: Medicare HMO

## 2018-01-11 VITALS — HR 86 | Temp 99.3°F | Resp 18 | Ht 63.5 in | Wt 199.4 lb

## 2018-01-11 DIAGNOSIS — Z23 Encounter for immunization: Secondary | ICD-10-CM

## 2018-01-11 DIAGNOSIS — C49A4 Gastrointestinal stromal tumor of large intestine: Secondary | ICD-10-CM

## 2018-01-11 DIAGNOSIS — Z86711 Personal history of pulmonary embolism: Secondary | ICD-10-CM | POA: Diagnosis not present

## 2018-01-11 LAB — CMP (CANCER CENTER ONLY)
ANION GAP: 7 (ref 5–15)
AST: 12 U/L — ABNORMAL LOW (ref 15–41)
Albumin: 3.4 g/dL — ABNORMAL LOW (ref 3.5–5.0)
Alkaline Phosphatase: 125 U/L (ref 38–126)
BILIRUBIN TOTAL: 0.8 mg/dL (ref 0.3–1.2)
BUN: 13 mg/dL (ref 8–23)
CALCIUM: 9.3 mg/dL (ref 8.9–10.3)
CO2: 26 mmol/L (ref 22–32)
CREATININE: 1.23 mg/dL — AB (ref 0.44–1.00)
Chloride: 109 mmol/L (ref 98–111)
GFR, EST AFRICAN AMERICAN: 50 mL/min — AB (ref >60)
GFR, Estimated: 43 mL/min — ABNORMAL LOW (ref >60)
GLUCOSE: 89 mg/dL (ref 70–99)
Potassium: 3.8 mmol/L (ref 3.5–5.1)
Sodium: 142 mmol/L (ref 135–145)
TOTAL PROTEIN: 7.8 g/dL (ref 6.5–8.1)

## 2018-01-11 LAB — CBC WITH DIFFERENTIAL (CANCER CENTER ONLY)
Basophils Absolute: 0 10*3/uL (ref 0.0–0.1)
Basophils Relative: 0 %
Eosinophils Absolute: 0.1 10*3/uL (ref 0.0–0.5)
Eosinophils Relative: 2 %
HEMATOCRIT: 36.2 % (ref 34.8–46.6)
Hemoglobin: 11.8 g/dL (ref 11.6–15.9)
LYMPHS ABS: 1.4 10*3/uL (ref 0.9–3.3)
LYMPHS PCT: 21 %
MCH: 27.1 pg (ref 25.1–34.0)
MCHC: 32.6 g/dL (ref 31.5–36.0)
MCV: 83.2 fL (ref 79.5–101.0)
MONOS PCT: 5 %
Monocytes Absolute: 0.4 10*3/uL (ref 0.1–0.9)
NEUTROS ABS: 4.9 10*3/uL (ref 1.5–6.5)
NEUTROS PCT: 72 %
Platelet Count: 298 10*3/uL (ref 145–400)
RBC: 4.35 MIL/uL (ref 3.70–5.45)
RDW: 15.2 % — ABNORMAL HIGH (ref 11.2–14.5)
WBC: 6.8 10*3/uL (ref 3.9–10.3)

## 2018-01-11 MED ORDER — INFLUENZA VAC SPLIT QUAD 0.5 ML IM SUSY
0.5000 mL | PREFILLED_SYRINGE | Freq: Once | INTRAMUSCULAR | Status: AC
  Administered 2018-01-11: 0.5 mL via INTRAMUSCULAR

## 2018-01-11 NOTE — Telephone Encounter (Signed)
Printed avs and calender of upcoming appointment. Per 9/13 los 

## 2018-01-11 NOTE — Telephone Encounter (Signed)
Patient declined contrast per 9/13 los

## 2018-01-11 NOTE — Progress Notes (Signed)
[place] OFFICE PROGRESS NOTE   Diagnosis: Gastrointestinal stromal tumor  INTERVAL HISTORY:   Brianna Walker returns as scheduled.  She continues Gleevec.  No diarrhea.  No pain.  No new complaint.  Objective:  Vital signs in last 24 hours:  There were no vitals taken for this visit.    HEENT: No thrush or ulcers Resp: Lungs clear bilaterally Cardio: Regular rate and rhythm GI: No hepatomegaly, no mass, nontender Vascular: No leg edema      Lab Results:  Lab Results  Component Value Date   WBC 6.9 11/22/2017   HGB 11.8 11/22/2017   HCT 36.3 11/22/2017   MCV 85.0 11/22/2017   PLT 296 11/22/2017   NEUTROABS 5.0 11/22/2017    CMP  Lab Results  Component Value Date   NA 139 11/22/2017   K 4.4 11/22/2017   CL 106 11/22/2017   CO2 26 11/22/2017   GLUCOSE 80 11/22/2017   BUN 18 11/22/2017   CREATININE 1.25 (H) 11/22/2017   CALCIUM 9.1 11/22/2017   PROT 7.8 11/22/2017   ALBUMIN 3.5 11/22/2017   AST 12 (L) 11/22/2017   ALT <6 11/22/2017   ALKPHOS 123 11/22/2017   BILITOT 0.6 11/22/2017   GFRNONAA 43 (L) 11/22/2017   GFRAA 50 (L) 11/22/2017     Medications: I have reviewed the patient's current medications.   Assessment/Plan: 1. Gastrointestinal stromal tumor of the rectum  Status post an endoscopic ultrasound on 08/06/2014 confirming a mass abutting the distal rectal wall with an FNA biopsy confirming a gastrointestinal stromal tumor  Initiation of Gleevec 09/04/2014  MRI pelvis 12/15/2014 with interval decreased size of the large anorectal mass with central necrosis.  Continuation of Gleevec  Gleevec placed on hold 06/02/2015 in anticipation of surgery  06/04/2015 status post low anterior resection and diverting loop ileostomy, resection of tumor  Pathology: 9.5 cm tumor; GISTsubtype spindle; mitotic rate 1/50 high-power field; positive margin of resection  Adjuvant Gleevec 08/19/2015 (three-year in total course  planned)  Ileostomy takedown 01/28/2016  Held 01/28/2016 through 02/14/2016, resumed 02/15/2016  Gleevec stop 04/11/2016-04/18/2016, and 04/26/2016-05/08/2016  Gleevec resumed at a dose of 200 mg daily on 05/09/2016  Gleevec dose increased to 300 mg daily beginning 06/20/2016  Gleevec dose decreased to 200 mg daily beginning 06/23/2016 (poor tolerance at 300 mg dose)  CTs 11/22/2017- enhancing soft tissue mass in the presacral space measuring 5.4 x 3.5 cm.  Enlarged pelvic lymph nodes.  [place] continued at a dose of 200 mg daily 2. Remote history of pulmonary embolism-maintained on apixaban 3. Multiple colon polyps noted on the colonoscopy 07/21/2014 with the pathology revealing tubular adenomas 4. Hospitalization 06/21/2015 through 07/06/2015 with nausea/vomiting, high ileostomy output; found to have delayed gastric emptying. 5. Water soluble contrast enema 08/13/2015 positive for a small to moderate volume of water soluble contrast leakage from the low anterior resection and anastomosis site in the pelvis 6. Ileostomy takedown and resection of cystic abdominal wall mass ( benign pathology) on 01/28/2016 7. Anemia with ferritin in the low normal range 05/23/2016 and 06/20/2016. Ferrous sulfate increased to twice daily 06/26/2016.Improved.   Disposition: Brianna Walker appears unchanged.  She will continue Gleevec.  She will return to the lab for a CBC and chemistry panel today.  She will return for an office visit and a restaging CT in approximately 6 weeks.  15 minutes were spent with the patient today.  The majority of the time was used for counseling and coordination of care.  Betsy Coder, MD  01/11/2018  10:05 AM

## 2018-01-15 ENCOUNTER — Other Ambulatory Visit (HOSPITAL_COMMUNITY)
Admission: RE | Admit: 2018-01-15 | Discharge: 2018-01-15 | Disposition: A | Discharge status: Home / Self Care | Payer: Medicare HMO | Source: Ambulatory Visit | Attending: Family Medicine | Admitting: Family Medicine

## 2018-01-15 DIAGNOSIS — M7989 Other specified soft tissue disorders: Secondary | ICD-10-CM | POA: Diagnosis present

## 2018-01-15 DIAGNOSIS — C49A5 Gastrointestinal stromal tumor of rectum: Secondary | ICD-10-CM | POA: Insufficient documentation

## 2018-01-29 ENCOUNTER — Encounter (HOSPITAL_COMMUNITY): Payer: Self-pay | Admitting: Oncology

## 2018-01-30 ENCOUNTER — Other Ambulatory Visit: Payer: Self-pay | Admitting: *Deleted

## 2018-01-30 MED ORDER — IMATINIB MESYLATE 100 MG PO TABS: 200.0000 mg | ORAL_TABLET | Freq: Every day | ORAL | 0 refills | Status: DC

## 2018-01-31 ENCOUNTER — Other Ambulatory Visit: Payer: Self-pay | Admitting: Nurse Practitioner

## 2018-01-31 ENCOUNTER — Telehealth: Payer: Self-pay | Admitting: *Deleted

## 2018-01-31 DIAGNOSIS — C49A4 Gastrointestinal stromal tumor of large intestine: Secondary | ICD-10-CM

## 2018-01-31 MED ORDER — NYSTATIN 100000 UNIT/GM EX POWD: Freq: Three times a day (TID) | CUTANEOUS | 0 refills | Status: AC

## 2018-01-31 NOTE — Telephone Encounter (Signed)
Patient called stating she needs a refill of Nystatin. She has a recurring yeast rash between and under her breast. Refill completed.

## 2018-02-07 ENCOUNTER — Ambulatory Visit: Payer: Medicare HMO | Admitting: Gastroenterology

## 2018-02-11 ENCOUNTER — Other Ambulatory Visit: Payer: Self-pay | Admitting: Oncology

## 2018-02-13 ENCOUNTER — Other Ambulatory Visit: Payer: Self-pay | Admitting: Oncology

## 2018-02-15 ENCOUNTER — Other Ambulatory Visit: Payer: Self-pay | Admitting: Oncology

## 2018-02-15 MED FILL — IMATINIB MESYLATE 100 MG TA: 100 | 30 days supply | Qty: 60 | Fill #0

## 2018-02-18 ENCOUNTER — Inpatient Hospital Stay: Payer: Medicare HMO | Attending: Oncology

## 2018-02-18 DIAGNOSIS — C49A5 Gastrointestinal stromal tumor of rectum: Secondary | ICD-10-CM | POA: Insufficient documentation

## 2018-02-18 DIAGNOSIS — R935 Abnormal findings on diagnostic imaging of other abdominal regions, including retroperitoneum: Secondary | ICD-10-CM | POA: Diagnosis not present

## 2018-02-18 DIAGNOSIS — R590 Localized enlarged lymph nodes: Secondary | ICD-10-CM | POA: Insufficient documentation

## 2018-02-18 DIAGNOSIS — Z79899 Other long term (current) drug therapy: Secondary | ICD-10-CM | POA: Diagnosis not present

## 2018-02-18 DIAGNOSIS — C49A4 Gastrointestinal stromal tumor of large intestine: Secondary | ICD-10-CM

## 2018-02-18 LAB — CBC WITH DIFFERENTIAL (CANCER CENTER ONLY)
Abs Immature Granulocytes: 0.02 10*3/uL (ref 0.00–0.07)
Basophils Absolute: 0 10*3/uL (ref 0.0–0.1)
Basophils Relative: 1 %
EOS ABS: 0.2 10*3/uL (ref 0.0–0.5)
Eosinophils Relative: 3 %
HEMATOCRIT: 36.2 % (ref 36.0–46.0)
Hemoglobin: 11.6 g/dL — ABNORMAL LOW (ref 12.0–15.0)
IMMATURE GRANULOCYTES: 0 %
LYMPHS ABS: 1.5 10*3/uL (ref 0.7–4.0)
Lymphocytes Relative: 24 %
MCH: 26 pg (ref 26.0–34.0)
MCHC: 32 g/dL (ref 30.0–36.0)
MCV: 81.2 fL (ref 80.0–100.0)
MONOS PCT: 5 %
Monocytes Absolute: 0.3 10*3/uL (ref 0.1–1.0)
NEUTROS PCT: 67 %
Neutro Abs: 4.3 10*3/uL (ref 1.7–7.7)
Platelet Count: 337 10*3/uL (ref 150–400)
RBC: 4.46 MIL/uL (ref 3.87–5.11)
RDW: 15.3 % (ref 11.5–15.5)
WBC Count: 6.3 10*3/uL (ref 4.0–10.5)
nRBC: 0 % (ref 0.0–0.2)

## 2018-02-18 LAB — CMP (CANCER CENTER ONLY)
ALT: 8 U/L (ref 0–44)
AST: 11 U/L — AB (ref 15–41)
Albumin: 3.2 g/dL — ABNORMAL LOW (ref 3.5–5.0)
Alkaline Phosphatase: 124 U/L (ref 38–126)
Anion gap: 10 (ref 5–15)
BILIRUBIN TOTAL: 0.8 mg/dL (ref 0.3–1.2)
BUN: 11 mg/dL (ref 8–23)
CO2: 23 mmol/L (ref 22–32)
CREATININE: 1.08 mg/dL — AB (ref 0.44–1.00)
Calcium: 8.9 mg/dL (ref 8.9–10.3)
Chloride: 109 mmol/L (ref 98–111)
GFR, EST NON AFRICAN AMERICAN: 51 mL/min — AB (ref >60)
GFR, Est AFR Am: 59 mL/min — ABNORMAL LOW (ref >60)
Glucose, Bld: 109 mg/dL — ABNORMAL HIGH (ref 70–99)
POTASSIUM: 3.4 mmol/L — AB (ref 3.5–5.1)
Sodium: 142 mmol/L (ref 135–145)
TOTAL PROTEIN: 7.7 g/dL (ref 6.5–8.1)

## 2018-02-20 ENCOUNTER — Telehealth: Payer: Self-pay

## 2018-02-20 ENCOUNTER — Inpatient Hospital Stay (HOSPITAL_BASED_OUTPATIENT_CLINIC_OR_DEPARTMENT_OTHER): Payer: Medicare HMO | Admitting: Oncology

## 2018-02-20 VITALS — BP 130/78 | HR 87 | Temp 97.6°F | Resp 19 | Ht 63.5 in | Wt 202.9 lb

## 2018-02-20 DIAGNOSIS — C49A4 Gastrointestinal stromal tumor of large intestine: Secondary | ICD-10-CM

## 2018-02-20 DIAGNOSIS — R590 Localized enlarged lymph nodes: Secondary | ICD-10-CM

## 2018-02-20 DIAGNOSIS — Z79899 Other long term (current) drug therapy: Secondary | ICD-10-CM | POA: Diagnosis not present

## 2018-02-20 DIAGNOSIS — C49A5 Gastrointestinal stromal tumor of rectum: Secondary | ICD-10-CM | POA: Diagnosis not present

## 2018-02-20 DIAGNOSIS — R935 Abnormal findings on diagnostic imaging of other abdominal regions, including retroperitoneum: Secondary | ICD-10-CM

## 2018-02-20 NOTE — Telephone Encounter (Signed)
Patient declined contrast.

## 2018-02-20 NOTE — Telephone Encounter (Signed)
Printed avs and calender of upcoming appointment. Per 10/23 los Gave contrast,instructios, and ct number

## 2018-02-20 NOTE — Progress Notes (Signed)
[place] OFFICE PROGRESS NOTE   Diagnosis: Gastrointestinal stromal tumor  INTERVAL HISTORY:   Brianna Walker returns for a scheduled visit.  She feels well.  Infrequent diarrhea.  She continues Gleevec.  No new complaint.  Objective:  Vital signs in last 24 hours:  Blood pressure 130/78, pulse 87, temperature 97.6 F (36.4 C), temperature source Oral, resp. rate 19, height 5' 3.5" (1.613 m), weight 202 lb 14.4 oz (92 kg), SpO2 98 %.    HEENT: No thrush or ulcers Resp: End inspiratory rales at the posterior bases, no respiratory distress Cardio: Regular rate and rhythm GI: Right lower quadrant hernia, no mass, nontender Vascular: No leg edema   Lab Results:  Lab Results  Component Value Date   WBC 6.3 02/18/2018   HGB 11.6 (L) 02/18/2018   HCT 36.2 02/18/2018   MCV 81.2 02/18/2018   PLT 337 02/18/2018   NEUTROABS 4.3 02/18/2018    CMP  Lab Results  Component Value Date   NA 142 02/18/2018   K 3.4 (L) 02/18/2018   CL 109 02/18/2018   CO2 23 02/18/2018   GLUCOSE 109 (H) 02/18/2018   BUN 11 02/18/2018   CREATININE 1.08 (H) 02/18/2018   CALCIUM 8.9 02/18/2018   PROT 7.7 02/18/2018   ALBUMIN 3.2 (L) 02/18/2018   AST 11 (L) 02/18/2018   ALT 8 02/18/2018   ALKPHOS 124 02/18/2018   BILITOT 0.8 02/18/2018   GFRNONAA 51 (L) 02/18/2018   GFRAA 59 (L) 02/18/2018     Medications: I have reviewed the patient's current medications.   Assessment/Plan: 1. Gastrointestinal stromal tumor of the rectum  Status post an endoscopic ultrasound on 08/06/2014 confirming a mass abutting the distal rectal wall with an FNA biopsy confirming a gastrointestinal stromal tumor  Initiation of Gleevec 09/04/2014  MRI pelvis 12/15/2014 with interval decreased size of the large anorectal mass with central necrosis.  Continuation of Gleevec  Gleevec placed on hold 06/02/2015 in anticipation of surgery  06/04/2015 status post low anterior resection and diverting  loop ileostomy, resection of tumor  Pathology: 9.5 cm tumor; GISTsubtype spindle; mitotic rate 1/50 high-power field; positive margin of resection  Adjuvant Gleevec 08/19/2015 (three-year in total course planned)  Ileostomy takedown 01/28/2016  Held 01/28/2016 through 02/14/2016, resumed 02/15/2016  Gleevec stop 04/11/2016-04/18/2016, and 04/26/2016-05/08/2016  Gleevec resumed at a dose of 200 mg daily on 05/09/2016  Gleevec dose increased to 300 mg daily beginning 06/20/2016  Gleevec dose decreased to 200 mg daily beginning 06/23/2016 (poor tolerance at 300 mg dose)  CTs 11/22/2017- enhancing soft tissue mass in the presacral space measuring 5.4 x 3.5 cm. Enlarged pelvic lymph nodes.  [place] continued at a dose of 200 mg daily 2. Remote history of pulmonary embolism-maintained on apixaban 3. Multiple colon polyps noted on the colonoscopy 07/21/2014 with the pathology revealing tubular adenomas 4. Hospitalization 06/21/2015 through 07/06/2015 with nausea/vomiting, high ileostomy output; found to have delayed gastric emptying. 5. Water soluble contrast enema 08/13/2015 positive for a small to moderate volume of water soluble contrast leakage from the low anterior resection and anastomosis site in the pelvis 6. Ileostomy takedown and resection of cystic abdominal wall mass ( benign pathology) on 01/28/2016 7. Anemia with ferritin in the low normal range 05/23/2016 and 06/20/2016. Ferrous sulfate increased to twice daily 06/26/2016.Improved.  Disposition: [name] appears unchanged.  There is no clinical evidence of disease progression.  Restaging CT was ordered for earlier this week.  This was not scheduled.  [name] will continue  Gleevec.  She will be scheduled for restaging CT prior to an office visit in 3 weeks.  15 minutes were spent with the patient today.  The majority of the time was used for counseling and coordination of care.  Betsy Coder, MD  02/20/2018    10:26 AM

## 2018-02-26 ENCOUNTER — Other Ambulatory Visit: Payer: Self-pay | Admitting: *Deleted

## 2018-02-28 ENCOUNTER — Telehealth: Payer: Self-pay | Admitting: Internal Medicine

## 2018-03-11 ENCOUNTER — Ambulatory Visit (HOSPITAL_COMMUNITY)
Admission: RE | Admit: 2018-03-11 | Discharge: 2018-03-11 | Disposition: A | Discharge status: Home / Self Care | Payer: Medicare HMO | Source: Ambulatory Visit | Attending: Oncology | Admitting: Oncology

## 2018-03-11 ENCOUNTER — Other Ambulatory Visit: Payer: Self-pay | Admitting: Oncology

## 2018-03-11 ENCOUNTER — Encounter (HOSPITAL_COMMUNITY): Payer: Self-pay

## 2018-03-11 DIAGNOSIS — K469 Unspecified abdominal hernia without obstruction or gangrene: Secondary | ICD-10-CM | POA: Diagnosis not present

## 2018-03-11 DIAGNOSIS — K439 Ventral hernia without obstruction or gangrene: Secondary | ICD-10-CM | POA: Diagnosis not present

## 2018-03-11 DIAGNOSIS — M799 Soft tissue disorder, unspecified: Secondary | ICD-10-CM | POA: Insufficient documentation

## 2018-03-11 DIAGNOSIS — C49A4 Gastrointestinal stromal tumor of large intestine: Secondary | ICD-10-CM

## 2018-03-11 MED ORDER — IOHEXOL 300 MG/ML  SOLN
100.0000 mL | Freq: Once | INTRAMUSCULAR | Status: AC | PRN
  Administered 2018-03-11: 100 mL via INTRAVENOUS

## 2018-03-11 MED ORDER — IOHEXOL 300 MG/ML  SOLN
30.0000 mL | Freq: Once | INTRAMUSCULAR | Status: AC | PRN
  Administered 2018-03-11: 30 mL via ORAL

## 2018-03-11 MED ORDER — SODIUM CHLORIDE (PF) 0.9 % IJ SOLN
INTRAMUSCULAR | Status: AC
  Filled 2018-03-11: qty 50

## 2018-03-13 ENCOUNTER — Encounter: Payer: Self-pay | Admitting: Nurse Practitioner

## 2018-03-13 ENCOUNTER — Inpatient Hospital Stay: Payer: Medicare HMO | Attending: Oncology | Admitting: Nurse Practitioner

## 2018-03-13 ENCOUNTER — Telehealth: Payer: Self-pay | Admitting: Oncology

## 2018-03-13 ENCOUNTER — Other Ambulatory Visit: Payer: Self-pay | Admitting: *Deleted

## 2018-03-13 VITALS — BP 154/90 | HR 72 | Temp 98.6°F | Resp 17 | Ht 63.5 in | Wt 201.5 lb

## 2018-03-13 DIAGNOSIS — Z79899 Other long term (current) drug therapy: Secondary | ICD-10-CM | POA: Diagnosis not present

## 2018-03-13 DIAGNOSIS — C49A4 Gastrointestinal stromal tumor of large intestine: Secondary | ICD-10-CM

## 2018-03-13 DIAGNOSIS — C49A5 Gastrointestinal stromal tumor of rectum: Secondary | ICD-10-CM | POA: Diagnosis not present

## 2018-03-13 DIAGNOSIS — D649 Anemia, unspecified: Secondary | ICD-10-CM | POA: Insufficient documentation

## 2018-03-13 MED ORDER — BENAZEPRIL HCL 40 MG PO TABS: 40.0000 mg | ORAL_TABLET | Freq: Every evening | ORAL | 0 refills | Status: DC

## 2018-03-13 MED ORDER — APIXABAN 5 MG PO TABS: 5.0000 mg | ORAL_TABLET | Freq: Two times a day (BID) | ORAL | 0 refills | Status: DC

## 2018-03-13 MED ORDER — OMEPRAZOLE 20 MG PO CPDR: 20.0000 mg | DELAYED_RELEASE_CAPSULE | Freq: Every day | ORAL | 0 refills | Status: DC

## 2018-03-13 MED ORDER — PRAVASTATIN SODIUM 20 MG PO TABS: 20.0000 mg | ORAL_TABLET | Freq: Every day | ORAL | 0 refills | Status: DC

## 2018-03-13 NOTE — Addendum Note (Signed)
Addended by: Juliet Rude on: 03/13/2018 09:44 AM   Modules accepted: Orders

## 2018-03-13 NOTE — Progress Notes (Addendum)
[facility] OFFICE PROGRESS NOTE   Diagnosis: Gastrointestinal stromal tumor  INTERVAL HISTORY:   Ms. Gritz returns as scheduled.  She continues Gleevec.  She feels well.  She denies nausea/vomiting.  No mouth sores.  No diarrhea.  No rash.  No abdominal pain.  Objective:  Vital signs in last 24 hours:  Blood pressure (!) 154/90, pulse 72, temperature 98.6 F (37 C), temperature source Oral, resp. rate 17, height 5' 3.5" (1.613 m), weight 201 lb 8 oz (91.4 kg), SpO2 100 %.    HEENT: No thrush or ulcers. Resp: Lungs clear bilaterally. Cardio: Regular rate and rhythm. GI: Abdomen soft and nontender.  No hepatomegaly.  No mass. Vascular: No leg edema.   Lab Results:  Lab Results  Component Value Date   WBC 6.3 02/18/2018   HGB 11.6 (L) 02/18/2018   HCT 36.2 02/18/2018   MCV 81.2 02/18/2018   PLT 337 02/18/2018   NEUTROABS 4.3 02/18/2018    Imaging:  Ct Abdomen Pelvis W Contrast  Result Date: 03/12/2018 CLINICAL DATA:  History of gastrointestinal stromal tumor. EXAM: CT ABDOMEN AND PELVIS WITH CONTRAST TECHNIQUE: Multidetector CT imaging of the abdomen and pelvis was performed using the standard protocol following bolus administration of intravenous contrast. CONTRAST:  118mL OMNIPAQUE IOHEXOL 300 MG/ML  SOLN COMPARISON:  Multiple previous studies. The most recent is 11/22/2017 FINDINGS: Lower chest: The lung bases are clear of an acute process. No worrisome pulmonary nodules or pleural effusion. The heart is within normal limits in size and stable. Small hiatal hernia. Hepatobiliary: No focal hepatic lesions to suggest metastatic disease. The gallbladder surgically absent. No common bile duct dilatation. Pancreas: No mass, inflammation or ductal dilatation. Spleen: Normal size.  No focal lesions. Adrenals/Urinary Tract: The adrenal glands are unremarkable and stable. Both kidneys are unremarkable and stable. No hydronephrosis. The bladder is unremarkable.  Stomach/Bowel: The stomach, duodenum and small bowel are unremarkable. No inflammatory changes, mass lesions or obstructive findings. The colons unremarkable. No colonic mass or obstruction. Moderate artifact through the lower pelvis by a right hip prosthesis but no rectal or perirectal mass. There is some persistent presacral soft tissue density which demonstrates less enhancement than on the prior study. Previously measured 3.5 x 2.1 cm and now measures 2.8 x 1.3 cm. Vascular/Lymphatic: The aorta and branch vessels are patent. Scattered atherosclerotic calcifications are stable. There are numerous small scattered retroperitoneal lymph nodes which are unchanged. In the pelvis there is a stable 9 mm lymph node on image number 54. There are also 2 small stable lymph nodes in the sigmoid mesocolon. 5.5 mm node on image number 61 and 5 mm node on image number 62 are unchanged. No new or progressive findings. Reproductive: The uterus is surgically absent. Both ovaries are still present and appear normal. Other: Anterior abdominal wall hernia containing small bowel loops is unchanged. Moderate anterior abdominal wall thickening likely related to prior surgery. Musculoskeletal: No significant findings. Stable appearing bone island in the left acetabulum. IMPRESSION: 1. Interval decrease in size of the presacral soft tissue mass as detailed above. There also appears to be less enhancement. 2. Stable small pelvic lymph nodes.  No new or progressive findings. 3. Stable anterior abdominal wall hernia containing small bowel loops but no findings for obstruction or incarceration. 4. No evidence of metastatic disease involving the lung bases or liver. Electronically Signed   By: Marijo Sanes M.D.   On: 03/12/2018 09:40    Medications: I have reviewed the patient's current medications.  Assessment/Plan: 1. Gastrointestinal stromal tumor of the rectum  Status post an endoscopic ultrasound on 08/06/2014 confirming a mass  abutting the distal rectal wall with an FNA biopsy confirming a gastrointestinal stromal tumor  Initiation of Gleevec 09/04/2014  MRI pelvis 12/15/2014 with interval decreased size of the large anorectal mass with central necrosis.  Continuation of Gleevec  Gleevec placed on hold 06/02/2015 in anticipation of surgery  06/04/2015 status post low anterior resection and diverting loop ileostomy, resection of tumor  Pathology: 9.5 cm tumor; GISTsubtype spindle; mitotic rate 1/50 high-power field; positive margin of resection  Adjuvant Gleevec 08/19/2015 (three-year in total course planned)  Ileostomy takedown 01/28/2016  Held 01/28/2016 through 02/14/2016, resumed 02/15/2016  Gleevec stop 04/11/2016-04/18/2016, and 04/26/2016-05/08/2016  Gleevec resumed at a dose of 200 mg daily on 05/09/2016  Gleevec dose increased to 300 mg daily beginning 06/20/2016  Gleevec dose decreased to 200 mg daily beginning 06/23/2016 (poor tolerance at 300 mg dose)  CTs 11/22/2017-enhancing soft tissue mass in the presacral space measuring 5.4 x 3.5 cm. Enlarged pelvic lymph nodes.  Gleevec continued at a dose of 200 mg daily  Restaging CTs 03/11/2018- interval decrease in size of the presacral soft tissue mass.  Stable small pelvic lymph nodes. 2. Remote history of pulmonary embolism-maintained on apixaban 3. Multiple colon polyps noted on the colonoscopy 07/21/2014 with the pathology revealing tubular adenomas 4. Hospitalization 06/21/2015 through 07/06/2015 with nausea/vomiting, high ileostomy output; found to have delayed gastric emptying. 5. Water soluble contrast enema 08/13/2015 positive for a small to moderate volume of water soluble contrast leakage from the low anterior resection and anastomosis site in the pelvis 6. Ileostomy takedown and resection of cystic abdominal wall mass ( benign pathology) on 01/28/2016 7. Anemia with ferritin in the low normal range 05/23/2016 and 06/20/2016. Ferrous  sulfate increased to twice daily 06/26/2016.Improved.   Disposition: Ms. Capers appears stable.  Dr. Benay Spice reviewed the recent CT results with her at today's visit.  The presacral mass is smaller.  She has completed the 3-year course of adjuvant Gleevec.  She will discontinue Gleevec.  We will obtain a follow-up CT scan at an approximate 74-month interval.  We will see her a few days after the CT scan to review the results.  She will contact the office in the interim with any problems.  Patient seen with Dr. Benay Spice.    Ned Card ANP/GNP-BC   03/13/2018  1:13 PM  This was a shared visit with Ned Card.  The restaging CT reveals a decrease in the size of the presacral mass.  The mass is most likely benign.  She has completed the 3-year course of adjuvant Gleevec.  Gleevec will be discontinued.  Julieanne Manson, MD

## 2018-03-13 NOTE — Telephone Encounter (Signed)
Pt called in and setup a Dr Jenny Reichmann next available cpe for 12/18.  She would like to  Know if this meds could be filled till she can come in for her cpe.  She has been going so much to her cancer dr, she didn't realize she has not been here for a year

## 2018-03-13 NOTE — Telephone Encounter (Signed)
Gave avs and calendar patient want s [place]

## 2018-04-15 ENCOUNTER — Other Ambulatory Visit: Payer: Self-pay | Admitting: Internal Medicine

## 2018-04-15 ENCOUNTER — Other Ambulatory Visit (INDEPENDENT_AMBULATORY_CARE_PROVIDER_SITE_OTHER): Payer: Medicare HMO

## 2018-04-15 DIAGNOSIS — R739 Hyperglycemia, unspecified: Secondary | ICD-10-CM | POA: Diagnosis not present

## 2018-04-15 DIAGNOSIS — Z0000 Encounter for general adult medical examination without abnormal findings: Secondary | ICD-10-CM

## 2018-04-15 DIAGNOSIS — E0500 Thyrotoxicosis with diffuse goiter without thyrotoxic crisis or storm: Secondary | ICD-10-CM | POA: Diagnosis not present

## 2018-04-15 LAB — LIPID PANEL
CHOL/HDL RATIO: 4
Cholesterol: 169 mg/dL (ref 0–200)
HDL: 38.8 mg/dL — ABNORMAL LOW (ref >39.00)
LDL Cholesterol: 95 mg/dL (ref 0–99)
NonHDL: 129.72
Triglycerides: 175 mg/dL — ABNORMAL HIGH (ref 0.0–149.0)
VLDL: 35 mg/dL (ref 0.0–40.0)

## 2018-04-15 LAB — BASIC METABOLIC PANEL
BUN: 13 mg/dL (ref 6–23)
CO2: 25 meq/L (ref 19–32)
CREATININE: 0.99 mg/dL (ref 0.40–1.20)
Calcium: 9.1 mg/dL (ref 8.4–10.5)
Chloride: 107 mEq/L (ref 96–112)
GFR: 71.23 mL/min (ref >60.00)
GLUCOSE: 93 mg/dL (ref 70–99)
Potassium: 3.7 mEq/L (ref 3.5–5.1)
SODIUM: 139 meq/L (ref 135–145)

## 2018-04-15 LAB — CBC WITH DIFFERENTIAL/PLATELET
BASOS ABS: 0.1 10*3/uL (ref 0.0–0.1)
Basophils Relative: 1.4 % (ref 0.0–3.0)
EOS PCT: 2.6 % (ref 0.0–5.0)
Eosinophils Absolute: 0.2 10*3/uL (ref 0.0–0.7)
HCT: 35.7 % — ABNORMAL LOW (ref 36.0–46.0)
HEMOGLOBIN: 11.7 g/dL — AB (ref 12.0–15.0)
Lymphocytes Relative: 26.4 % (ref 12.0–46.0)
Lymphs Abs: 2 10*3/uL (ref 0.7–4.0)
MCHC: 32.7 g/dL (ref 30.0–36.0)
MCV: 78.4 fl (ref 78.0–100.0)
MONO ABS: 0.4 10*3/uL (ref 0.1–1.0)
MONOS PCT: 5.9 % (ref 3.0–12.0)
NEUTROS PCT: 63.7 % (ref 43.0–77.0)
Neutro Abs: 4.7 10*3/uL (ref 1.4–7.7)
Platelets: 361 10*3/uL (ref 150.0–400.0)
RBC: 4.56 Mil/uL (ref 3.87–5.11)
RDW: 15.7 % — ABNORMAL HIGH (ref 11.5–15.5)
WBC: 7.4 10*3/uL (ref 4.0–10.5)

## 2018-04-15 LAB — URINALYSIS, ROUTINE W REFLEX MICROSCOPIC
Bilirubin Urine: NEGATIVE
KETONES UR: NEGATIVE
Nitrite: NEGATIVE
SPECIFIC GRAVITY, URINE: 1.01 (ref 1.000–1.030)
Total Protein, Urine: NEGATIVE
Urine Glucose: NEGATIVE
Urobilinogen, UA: 0.2 (ref 0.0–1.0)
pH: 6 (ref 5.0–8.0)

## 2018-04-15 LAB — HEPATIC FUNCTION PANEL
ALK PHOS: 107 U/L (ref 39–117)
ALT: 8 U/L (ref 0–35)
AST: 10 U/L (ref 0–37)
Albumin: 3.7 g/dL (ref 3.5–5.2)
BILIRUBIN DIRECT: 0.1 mg/dL (ref 0.0–0.3)
BILIRUBIN TOTAL: 0.6 mg/dL (ref 0.2–1.2)
Total Protein: 7.7 g/dL (ref 6.0–8.3)

## 2018-04-15 LAB — TSH: TSH: 0.09 u[IU]/mL — AB (ref 0.35–4.50)

## 2018-04-15 LAB — T4, FREE: FREE T4: 0.6 ng/dL (ref 0.60–1.60)

## 2018-04-15 LAB — HEMOGLOBIN A1C: HEMOGLOBIN A1C: 5.7 % (ref 4.6–6.5)

## 2018-04-17 ENCOUNTER — Encounter: Payer: Medicare HMO | Admitting: Internal Medicine

## 2018-04-22 ENCOUNTER — Telehealth: Payer: Self-pay | Admitting: Internal Medicine

## 2018-04-22 NOTE — Telephone Encounter (Signed)
Patient states she came to her husbands visit with Dr. Jenny Reichmann on 12/16.  Patient states Dr. Jenny Reichmann entered her lab orders but also thought he had completed her physical as well.   Patient was scheduled for CPE on 12/18 but did not show thinking it was completed on 12/16.  I do not see an OV note.  Please advise.

## 2018-04-22 NOTE — Telephone Encounter (Signed)
Pt needs to be rescheduled. Only her lab work was collected that day since she was her with her husband for [place] to have that back in time to go over with her during her OV.

## 2018-05-07 ENCOUNTER — Encounter: Payer: Self-pay | Admitting: Internal Medicine

## 2018-05-07 ENCOUNTER — Ambulatory Visit (INDEPENDENT_AMBULATORY_CARE_PROVIDER_SITE_OTHER): Payer: Medicare HMO | Admitting: Internal Medicine

## 2018-05-07 VITALS — BP 134/86 | HR 96 | Temp 97.6°F | Ht 63.5 in | Wt 193.0 lb

## 2018-05-07 DIAGNOSIS — Z0000 Encounter for general adult medical examination without abnormal findings: Secondary | ICD-10-CM

## 2018-05-07 DIAGNOSIS — R739 Hyperglycemia, unspecified: Secondary | ICD-10-CM | POA: Diagnosis not present

## 2018-05-07 NOTE — Patient Instructions (Addendum)
Please continue all other medications as before, and refills have been done if requested.  Please have the pharmacy call with any other refills you may need.  Please continue your efforts at being more active, low cholesterol diet, and weight control.  You are otherwise up to date with prevention measures today.  Please keep your appointments with your specialists as you may have planned  Please return in 1 year for your yearly visit, or sooner if needed 

## 2018-05-07 NOTE — Progress Notes (Signed)
Subjective:    Patient ID: Brianna Walker, female    DOB: 1945/09/20, 72 y.o.   MRN: [id number]  HPI  Here for wellness and f/u;  Overall doing ok;  Pt denies Chest pain, worsening SOB, DOE, wheezing, orthopnea, PND, worsening LE edema, palpitations, dizziness or syncope.  Pt denies neurological change such as new headache, facial or extremity weakness.  Pt denies polydipsia, polyuria, or low sugar symptoms. Pt states overall good compliance with treatment and medications, good tolerability, and has been trying to follow appropriate diet.  Pt denies worsening depressive symptoms, suicidal ideation or panic. No fever, night sweats, wt loss, loss of appetite, or other constitutional symptoms.  Pt states good ability with ADL's, has low fall risk, home safety reviewed and adequate, no other significant changes in hearing or vision, and only occasionally active with exercise.  No other new complaints Past Medical History:  Diagnosis Date  . AKI (acute kidney injury) ([place]) 06/21/2015  . ALLERGIC RHINITIS 09/12/2007  . Arthritis   . BACK PAIN 06/19/2008  . Cancer ([place])    dx. 4'16 -oral chemotherapy only.  . Colon polyps   . Cramp of limb 08/11/2009  . DEEP VENOUS THROMBOPHLEBITIS, LEG, RIGHT 03/04/2010   right leg  . Diverticulosis   . FREQUENCY, URINARY 10/07/2009  . GERD 12/05/2006  . HEMORRHOIDS 11/17/2006  . HIP PAIN, RIGHT, CHRONIC 08/11/2009  . HX, PERSONAL, TUBERCULOSIS 11/17/2006  . HYPERLIPIDEMIA 09/12/2007  . Hyperlipidemia 09/12/2007   Qualifier: Diagnosis of  By: Jenny Reichmann MD, [place]is   . HYPERTENSION 11/17/2006  . INSOMNIA-SLEEP DISORDER-UNSPEC 06/19/2008  . Iron deficiency anemia 12/07/2011  . LEG PAIN, RIGHT 06/19/2008  . Long term (current) use of anticoagulants 11/29/2010  . MASS, SUPERFICIAL 09/12/2007  . MENOPAUSAL DISORDER 09/20/2009  . OVERACTIVE BLADDER 03/04/2010  . Perforation of colon ([place]) 11/17/2015  . Pneumonia   . PULMONARY EMBOLISM, HX OF 11/17/2006   [place] [facility]  s/p Pneumonia  . RECTAL BLEEDING 10/07/2007  . Rectal mass 06/2014  . SCIATICA, RIGHT 09/12/2007  . Shortness of breath dyspnea    With exertion since chemotherapy tx-oral meds  . SKIN LESION 07/20/2009  . TMJ SYNDROME 11/17/2006  . Tuberculosis    pt. was tx.   Past Surgical History:  Procedure Laterality Date  . ABDOMINAL HYSTERECTOMY    . CHOLECYSTECTOMY OPEN    . EUS N/A 08/06/2014   Procedure: LOWER ENDOSCOPIC ULTRASOUND (EUS);  Surgeon: Milus Banister, MD;  Location: [facility];  Service: Endoscopy;  Laterality: N/A;  . EXCISION MASS ABDOMINAL N/A 01/28/2016   Procedure: EXCISION  ABDOMINAL WALL MASS, CYST, QUESTION FAT NECROSIS, QUESTION SEROMA;  Surgeon: Michael Boston, MD;  Location: [facility];  Service: General;  Laterality: N/A;  . hemorroid surgery  removed at dr Silvio Pate office   x 2  . ILEOSTOMY CLOSURE N/A 01/28/2016   Procedure: TAKEDOWN OF LOOP ILEOSTOMY;  Surgeon: Michael Boston, MD;  Location: [facility];  Service: General;  Laterality: N/A;  . [place]  2002     multiple ventral hernia repair/mesh  . INCISIONAL HERNIA REPAIR  01/13/2005   open w onlay Proceed mesh.  Marland Kitchen LAPAROSCOPIC LYSIS OF ADHESIONS  06/04/2015   Procedure: LAPAROSCOPIC LYSIS OF ADHESIONS;  Surgeon: Michael Boston, MD;  Location: [facility];  Service: General;;  . PROCTOSCOPY N/A 01/28/2016   Procedure: RIGID PROCTOSCOPY, DILATION OF COLORECTAL ANASTOMOTIC STRICTURE;  Surgeon: Michael Boston, MD;  Location: [facility];  Service: General;  Laterality: N/A;  .  s/p right hip replaced min invasive hip surgury [facility] oct 2011 Right 2011  . tumor removed off of right shoulder Right   . XI ROBOTIC ASSISTED LOWER ANTERIOR RESECTION N/A 06/04/2015   Procedure: XI ROBOTIC ASSISTED LYSIS OF ADHESIONS, LOWER ANTERIOR RESECTION TRANS ABDOMINAL AND TRANS ANAL, COLOANAL HANDSEWN ANASTAOSIS, DIVERTING LOPE ILIOSTOMY, RESECTION GIST TUMOR;  Surgeon: Michael Boston, MD;  Location: [facility];  Service: General;  Laterality: N/A;     reports that she quit smoking about 40 years ago. Her smoking use included cigarettes. She smoked 0.00 packs per day. She has never used smokeless tobacco. She reports current alcohol use. She reports that she does not use drugs. family history includes Colon cancer in her father; Diabetes in her sister; Heart disease in her mother and sister. No Known Allergies Current Outpatient Medications on File Prior to Visit  Medication Sig Dispense Refill  . acetaminophen (TYLENOL) 500 MG tablet Take 1,000 mg by mouth every 6 (six) hours as needed for moderate pain or headache.    Marland Kitchen apixaban (ELIQUIS) 5 MG TABS tablet Take 1 tablet (5 mg total) by mouth 2 (two) times daily. 180 tablet 0  . aspirin 81 MG tablet Take 81 mg by mouth at bedtime.     . benazepril (LOTENSIN) 40 MG tablet Take 1 tablet (40 mg total) by mouth every evening. 90 tablet 0  . estradiol (VIVELLE-DOT) 0.025 MG/24HR APPLY 1 PATCH ONTO THE SKIN ONE TIME WEEKLY 12 patch 3  . ferrous sulfate 325 (65 FE) MG tablet Take 1 tablet (325 mg total) by mouth 2 (two) times daily with a meal. 180 tablet 3  . loperamide (IMODIUM) 2 MG capsule Take 1-2 capsules (2-4 mg total) by mouth every 8 (eight) hours as needed for diarrhea or loose stools (Use if >2 BM every 8 hours). 30 capsule 0  . LORazepam (ATIVAN) 0.5 MG tablet Take 1 tablet (0.5 mg total) by mouth at bedtime as needed for anxiety. 30 tablet 0  . meloxicam (MOBIC) 15 MG tablet Take 1 tablet (15 mg total) by mouth daily as needed for pain. 30 tablet 5  . omeprazole (PRILOSEC) 20 MG capsule Take 1 capsule (20 mg total) by mouth daily. 90 capsule 0  . potassium chloride SA (K-DUR,KLOR-CON) 20 MEQ tablet Take 1 tablet twice daily for 3 days, then take 1 tablet by mouth daily. 30 tablet 1  . pravastatin (PRAVACHOL) 20 MG tablet Take 1 tablet (20 mg total) by mouth at bedtime. 90 tablet 0  . Propylhexedrine (BENZEDREX) INHA Place 1 each into the nose daily as needed (congestion).    . psyllium  (HYDROCIL/METAMUCIL) 95 % PACK Take 1 packet by mouth daily. 56 each 0  . traMADol (ULTRAM) 50 MG tablet Take 1-2 tablets (50-100 mg total) by mouth every 6 (six) hours as needed for moderate pain. 40 tablet 1  . Vitamin D, Ergocalciferol, (DRISDOL) 50000 units CAPS capsule Take 1 capsule (50,000 Units total) by mouth every 7 (seven) days. 12 capsule 0   No current facility-administered medications on file prior to visit.    Review of Systems Constitutional: Negative for other unusual diaphoresis, sweats, appetite or weight changes HENT: Negative for other worsening hearing loss, ear pain, facial swelling, mouth sores or neck stiffness.   Eyes: Negative for other worsening pain, redness or other visual disturbance.  Respiratory: Negative for other stridor or swelling Cardiovascular: Negative for other palpitations or other chest pain  Gastrointestinal: Negative for worsening diarrhea or loose stools,  blood in stool, distention or other pain Genitourinary: Negative for hematuria, flank pain or other change in urine volume.  Musculoskeletal: Negative for myalgias or other joint swelling.  Skin: Negative for other color change, or other wound or worsening drainage.  Neurological: Negative for other syncope or numbness. Hematological: Negative for other adenopathy or swelling Psychiatric/Behavioral: Negative for hallucinations, other worsening agitation, SI, self-injury, or new decreased concentration All other system neg per pt    Objective:   Physical Exam BP 134/86   Pulse 96   Temp 97.6 F (36.4 C) (Oral)   Ht 5' 3.5" (1.613 m)   Wt 193 lb (87.5 kg)   SpO2 99%   BMI 33.65 kg/m  VS noted,  Constitutional: Pt is oriented to person, place, and time. Appears well-developed and well-nourished, in no significant distress and comfortable Head: Normocephalic and atraumatic  Eyes: Conjunctivae and EOM are normal. Pupils are equal, round, and reactive to light Right Ear: External ear normal  without discharge Left Ear: External ear normal without discharge Nose: Nose without discharge or deformity Mouth/Throat: Oropharynx is without other ulcerations and moist  Neck: Normal range of motion. Neck supple. No JVD present. No tracheal deviation present or significant neck LA or mass Cardiovascular: Normal rate, regular rhythm, normal heart sounds and intact distal pulses.   Pulmonary/Chest: WOB normal and breath sounds without rales or wheezing  Abdominal: Soft. Bowel sounds are normal. NT. No HSM  Musculoskeletal: Normal range of motion. Exhibits no edema Lymphadenopathy: Has no other cervical adenopathy.  Neurological: Pt is alert and oriented to person, place, and time. Pt has normal reflexes. No cranial nerve deficit. Motor grossly intact, Gait intact Skin: Skin is warm and dry. No rash noted or new ulcerations Psychiatric:  Has normal mood and affect. Behavior is normal without agitation No other exam findings Lab Results  Component Value Date   WBC 7.4 04/15/2018   HGB 11.7 (L) 04/15/2018   HCT 35.7 (L) 04/15/2018   PLT 361.0 04/15/2018   GLUCOSE 93 04/15/2018   CHOL 169 04/15/2018   TRIG 175.0 (H) 04/15/2018   HDL 38.80 (L) 04/15/2018   LDLDIRECT 49.0 09/14/2015   LDLCALC 95 04/15/2018   ALT 8 04/15/2018   AST 10 04/15/2018   NA 139 04/15/2018   K 3.7 04/15/2018   CL 107 04/15/2018   CREATININE 0.99 04/15/2018   BUN 13 04/15/2018   CO2 25 04/15/2018   TSH 0.09 (L) 04/15/2018   INR 0.94 01/26/2016   HGBA1C 5.7 04/15/2018          Assessment & Plan:

## 2018-05-07 NOTE — Assessment & Plan Note (Signed)

## 2018-05-07 NOTE — Assessment & Plan Note (Signed)
stable overall by history and exam, recent data reviewed with pt, and pt to continue medical treatment as before,  to f/u any worsening symptoms or concerns  

## 2018-05-27 ENCOUNTER — Other Ambulatory Visit: Payer: Self-pay

## 2018-05-27 MED ORDER — NYSTATIN 100000 UNIT/GM EX POWD: Freq: Three times a day (TID) | CUTANEOUS | 0 refills | Status: DC

## 2018-05-27 NOTE — Telephone Encounter (Signed)
TC from Pt. Requesting refill for nystop(nystatin) OK to refill per Lattie Haw, medication called in to [facility](see mar)

## 2018-06-10 ENCOUNTER — Other Ambulatory Visit: Payer: Self-pay | Admitting: Internal Medicine

## 2018-06-13 ENCOUNTER — Telehealth: Payer: Self-pay

## 2018-06-13 ENCOUNTER — Inpatient Hospital Stay (HOSPITAL_BASED_OUTPATIENT_CLINIC_OR_DEPARTMENT_OTHER): Payer: Medicare HMO | Admitting: Oncology

## 2018-06-13 ENCOUNTER — Inpatient Hospital Stay: Payer: Medicare HMO | Attending: Oncology

## 2018-06-13 VITALS — BP 130/80 | HR 86 | Temp 98.6°F | Resp 19 | Ht 63.5 in | Wt 199.0 lb

## 2018-06-13 DIAGNOSIS — Z86711 Personal history of pulmonary embolism: Secondary | ICD-10-CM | POA: Diagnosis not present

## 2018-06-13 DIAGNOSIS — C49A5 Gastrointestinal stromal tumor of rectum: Secondary | ICD-10-CM | POA: Diagnosis not present

## 2018-06-13 DIAGNOSIS — C49A4 Gastrointestinal stromal tumor of large intestine: Secondary | ICD-10-CM | POA: Diagnosis not present

## 2018-06-13 DIAGNOSIS — Z7901 Long term (current) use of anticoagulants: Secondary | ICD-10-CM | POA: Insufficient documentation

## 2018-06-13 LAB — CMP (CANCER CENTER ONLY)
AST: 8 U/L — ABNORMAL LOW (ref 15–41)
Albumin: 3.3 g/dL — ABNORMAL LOW (ref 3.5–5.0)
Alkaline Phosphatase: 131 U/L — ABNORMAL HIGH (ref 38–126)
Anion gap: 9 (ref 5–15)
BILIRUBIN TOTAL: 0.6 mg/dL (ref 0.3–1.2)
BUN: 15 mg/dL (ref 8–23)
CALCIUM: 9 mg/dL (ref 8.9–10.3)
CHLORIDE: 108 mmol/L (ref 98–111)
CO2: 25 mmol/L (ref 22–32)
CREATININE: 1.18 mg/dL — AB (ref 0.44–1.00)
GFR, EST AFRICAN AMERICAN: 54 mL/min — AB (ref >60)
GFR, EST NON AFRICAN AMERICAN: 47 mL/min — AB (ref >60)
Glucose, Bld: 99 mg/dL (ref 70–99)
Potassium: 3.9 mmol/L (ref 3.5–5.1)
Sodium: 142 mmol/L (ref 135–145)
TOTAL PROTEIN: 8 g/dL (ref 6.5–8.1)

## 2018-06-13 NOTE — Telephone Encounter (Signed)
Printed avs and calender of upcoming appointment. Per 2/13 los 

## 2018-06-13 NOTE — Progress Notes (Signed)
[place] OFFICE PROGRESS NOTE   Diagnosis: Gastrointestinal stromal tumor  INTERVAL HISTORY:   Brianna Walker returns as scheduled.  She feels well.  No diarrhea.  She had a recent upper respiratory infection with her airway congestion and a cough.  She feels better.  Objective:  Vital signs in last 24 hours:  Blood pressure 130/80, pulse 86, temperature 98.6 F (37 C), temperature source Oral, resp. rate 19, height 5' 3.5" (1.613 m), weight 199 lb (90.3 kg), SpO2 99 %.    HEENT: Neck without mass Lymphatics: No cervical, supraclavicular, axillary, or inguinal nodes Resp: Lungs clear bilaterally Cardio: Regular rate and rhythm GI: No hepatosplenomegaly, no apparent ascites, no mass, right lower abdomen hernia Vascular: No leg edema   Lab Results:  Lab Results  Component Value Date   WBC 7.4 04/15/2018   HGB 11.7 (L) 04/15/2018   HCT 35.7 (L) 04/15/2018   MCV 78.4 04/15/2018   PLT 361.0 04/15/2018   NEUTROABS 4.7 04/15/2018    CMP  Lab Results  Component Value Date   NA 142 06/13/2018   K 3.9 06/13/2018   CL 108 06/13/2018   CO2 25 06/13/2018   GLUCOSE 99 06/13/2018   BUN 15 06/13/2018   CREATININE 1.18 (H) 06/13/2018   CALCIUM 9.0 06/13/2018   PROT 8.0 06/13/2018   ALBUMIN 3.3 (L) 06/13/2018   AST 8 (L) 06/13/2018   ALT <6 06/13/2018   ALKPHOS 131 (H) 06/13/2018   BILITOT 0.6 06/13/2018   GFRNONAA 47 (L) 06/13/2018   GFRAA 54 (L) 06/13/2018    No results found for: CEA1  Lab Results  Component Value Date   INR 0.94 01/26/2016    Imaging:  No results found.  Medications: I have reviewed the patient's current medications.   Assessment/Plan: 1. Gastrointestinal stromal tumor of the rectum  Status post an endoscopic ultrasound on 08/06/2014 confirming a mass abutting the distal rectal wall with an FNA biopsy confirming a gastrointestinal stromal tumor  Initiation of Gleevec 09/04/2014  MRI pelvis 12/15/2014 with interval  decreased size of the large anorectal mass with central necrosis.  Continuation of Gleevec  Gleevec placed on hold 06/02/2015 in anticipation of surgery  06/04/2015 status post low anterior resection and diverting loop ileostomy, resection of tumor  Pathology: 9.5 cm tumor; GISTsubtype spindle; mitotic rate 1/50 high-power field; positive margin of resection  Adjuvant Gleevec 08/19/2015 (three-year in total course planned)  Ileostomy takedown 01/28/2016  Held 01/28/2016 through 02/14/2016, resumed 02/15/2016  Gleevec stop 04/11/2016-04/18/2016, and 04/26/2016-05/08/2016  Gleevec resumed at a dose of 200 mg daily on 05/09/2016  Gleevec dose increased to 300 mg daily beginning 06/20/2016  Gleevec dose decreased to 200 mg daily beginning 06/23/2016 (poor tolerance at 300 mg dose)  CTs 11/22/2017-enhancing soft tissue mass in the presacral space measuring 5.4 x 3.5 cm. Enlarged pelvic lymph nodes.  Gleevec continued at a dose of 200 mg daily  Restaging CTs 03/11/2018- interval decrease in size of the presacral soft tissue mass.  Stable small pelvic lymph nodes.  Gleevec discontinued 03/13/2018 2. Remote history of pulmonary embolism-maintained on apixaban 3. Multiple colon polyps noted on the colonoscopy 07/21/2014 with the pathology revealing tubular adenomas 4. Hospitalization 06/21/2015 through 07/06/2015 with nausea/vomiting, high ileostomy output; found to have delayed gastric emptying. 5. Water soluble contrast enema 08/13/2015 positive for a small to moderate volume of water soluble contrast leakage from the low anterior resection and anastomosis site in the pelvis 6. Ileostomy takedown and resection of cystic abdominal wall  mass ( benign pathology) on 01/28/2016 7. Anemia with ferritin in the low normal range 05/23/2016 and 06/20/2016. Ferrous sulfate increased to twice daily 06/26/2016.Improved.  Disposition: Brianna Walker is in clinical remission from the  gastrointestinal stromal tumor.  She will return for an office visit and restaging CT evaluation in 3 months.  15 minutes were spent with the patient today.  The majority of the time was used for counseling and coordination of care.  Betsy Coder, MD  06/13/2018  12:55 PM

## 2018-07-24 ENCOUNTER — Other Ambulatory Visit: Payer: Self-pay | Admitting: Internal Medicine

## 2018-09-11 ENCOUNTER — Other Ambulatory Visit: Payer: Medicare HMO

## 2018-09-11 ENCOUNTER — Ambulatory Visit (HOSPITAL_COMMUNITY): Admission: RE | Admit: 2018-09-11 | Payer: Medicare HMO | Source: Ambulatory Visit

## 2018-09-12 ENCOUNTER — Telehealth: Payer: Self-pay | Admitting: *Deleted

## 2018-09-12 NOTE — Telephone Encounter (Signed)
Called patient to follow up on her CT scan needed before 09/13/18. She reports she canceled the scan due to COVID situation and wants to cancel her OV tomorrow as well. Visit canceled and provided her telephone # to call central scheduling to get scan scheduled and she will call office with date for OV to be coordinated. She reports she is feeling well with no issues at this time.

## 2018-09-13 ENCOUNTER — Ambulatory Visit: Payer: Medicare HMO | Admitting: Nurse Practitioner

## 2018-09-19 ENCOUNTER — Other Ambulatory Visit: Payer: Self-pay | Admitting: Internal Medicine

## 2018-10-01 ENCOUNTER — Other Ambulatory Visit: Payer: Self-pay | Admitting: *Deleted

## 2018-10-01 MED ORDER — NYSTATIN 100000 UNIT/GM EX POWD: Freq: Three times a day (TID) | CUTANEOUS | 0 refills | Status: DC | PRN

## 2018-10-17 ENCOUNTER — Other Ambulatory Visit: Payer: Self-pay | Admitting: Internal Medicine

## 2018-10-17 DIAGNOSIS — Z1231 Encounter for screening mammogram for malignant neoplasm of breast: Secondary | ICD-10-CM

## 2018-11-07 ENCOUNTER — Telehealth: Payer: Self-pay | Admitting: Oncology

## 2018-11-07 ENCOUNTER — Telehealth: Payer: Self-pay | Admitting: *Deleted

## 2018-11-07 NOTE — Telephone Encounter (Signed)
Called pt per 7/09 sch message - unable to reach pt . Scheduled appt and left message with appt date and time

## 2018-11-07 NOTE — Telephone Encounter (Signed)
Patient called to reschedule her missed CT scan from May 2020. Prefers mid to late morning and Wed/Thursday. Drinks the water based oral contrast and MD told her she does not need the IV contrast. Will verify this information w/MD before scheduling scan. Confirmed w/MD for oral contrast only and she does not need a CMP prior. Patient notified of scan on 11/14/18 at 1130 and arrive at 0930 to begin drinking her contrast. Scheduling message sent to schedule F/U few days after scan.

## 2018-11-13 ENCOUNTER — Telehealth: Payer: Self-pay | Admitting: *Deleted

## 2018-11-13 ENCOUNTER — Other Ambulatory Visit: Payer: Medicare HMO

## 2018-11-13 NOTE — Telephone Encounter (Signed)
[address]ELASIA FURNISH [phone]).  Was not aware of this mornings lab appointment.  I did not receive a call.  Can you help me?  I need lab before I can have CT scans tomorrow.  Should I reschedule scans to next week?"  Reached Marie in lab.  Jennette Bill may come now for lab work.     "I have a car so I am able to come now.  Advised Lab closes early today with work-in availability only before 3:00 pm.

## 2018-11-13 NOTE — Telephone Encounter (Signed)
Lab called Triage inquiring about lab rescheduled tomorrow am at 10:30 before scans.  Confirmed with patient.  "Brianna Walker will come in am for lab before scans and drink contrast when I am there."  Lab notified of [name] decision to come in tomorrow as scheduled at 10:30 am.

## 2018-11-14 ENCOUNTER — Ambulatory Visit (HOSPITAL_COMMUNITY)
Admission: RE | Admit: 2018-11-14 | Discharge: 2018-11-14 | Disposition: A | Discharge status: Home / Self Care | Payer: Medicare HMO | Source: Ambulatory Visit | Attending: Oncology | Admitting: Oncology

## 2018-11-14 ENCOUNTER — Other Ambulatory Visit: Payer: Self-pay | Admitting: Oncology

## 2018-11-14 ENCOUNTER — Inpatient Hospital Stay: Payer: Medicare HMO | Attending: Oncology

## 2018-11-14 ENCOUNTER — Other Ambulatory Visit: Payer: Self-pay

## 2018-11-14 DIAGNOSIS — D649 Anemia, unspecified: Secondary | ICD-10-CM | POA: Insufficient documentation

## 2018-11-14 DIAGNOSIS — C49A4 Gastrointestinal stromal tumor of large intestine: Secondary | ICD-10-CM | POA: Insufficient documentation

## 2018-11-14 DIAGNOSIS — C49A5 Gastrointestinal stromal tumor of rectum: Secondary | ICD-10-CM | POA: Diagnosis not present

## 2018-11-14 DIAGNOSIS — Z86010 Personal history of colonic polyps: Secondary | ICD-10-CM | POA: Diagnosis not present

## 2018-11-14 DIAGNOSIS — Z86711 Personal history of pulmonary embolism: Secondary | ICD-10-CM | POA: Diagnosis not present

## 2018-11-14 DIAGNOSIS — K439 Ventral hernia without obstruction or gangrene: Secondary | ICD-10-CM | POA: Diagnosis not present

## 2018-11-14 DIAGNOSIS — C49A0 Gastrointestinal stromal tumor, unspecified site: Secondary | ICD-10-CM | POA: Diagnosis not present

## 2018-11-14 LAB — CMP (CANCER CENTER ONLY)
ALT: 7 U/L (ref 0–44)
AST: 12 U/L — ABNORMAL LOW (ref 15–41)
Albumin: 3.4 g/dL — ABNORMAL LOW (ref 3.5–5.0)
Alkaline Phosphatase: 148 U/L — ABNORMAL HIGH (ref 38–126)
Anion gap: 10 (ref 5–15)
BUN: 12 mg/dL (ref 8–23)
CO2: 25 mmol/L (ref 22–32)
Calcium: 9 mg/dL (ref 8.9–10.3)
Chloride: 107 mmol/L (ref 98–111)
Creatinine: 1 mg/dL (ref 0.44–1.00)
GFR, Est AFR Am: >60 mL/min (ref >60)
GFR, Estimated: 57 mL/min — ABNORMAL LOW (ref >60)
Glucose, Bld: 96 mg/dL (ref 70–99)
Potassium: 3.8 mmol/L (ref 3.5–5.1)
Sodium: 142 mmol/L (ref 135–145)
Total Bilirubin: 0.7 mg/dL (ref 0.3–1.2)
Total Protein: 8.5 g/dL — ABNORMAL HIGH (ref 6.5–8.1)

## 2018-11-14 MED ORDER — IOHEXOL 300 MG/ML  SOLN
30.0000 mL | Freq: Once | INTRAMUSCULAR | Status: AC | PRN
  Administered 2018-11-14: 10:00:00 30 mL via ORAL

## 2018-11-18 ENCOUNTER — Telehealth: Payer: Self-pay

## 2018-11-18 ENCOUNTER — Telehealth: Payer: Self-pay | Admitting: Oncology

## 2018-11-18 ENCOUNTER — Inpatient Hospital Stay (HOSPITAL_BASED_OUTPATIENT_CLINIC_OR_DEPARTMENT_OTHER): Payer: Medicare HMO | Admitting: Oncology

## 2018-11-18 ENCOUNTER — Inpatient Hospital Stay: Payer: Medicare HMO | Admitting: Oncology

## 2018-11-18 ENCOUNTER — Inpatient Hospital Stay: Payer: Medicare HMO | Admitting: Nurse Practitioner

## 2018-11-18 DIAGNOSIS — C49A4 Gastrointestinal stromal tumor of large intestine: Secondary | ICD-10-CM | POA: Diagnosis not present

## 2018-11-18 NOTE — Progress Notes (Signed)
[place] OFFICE VISIT PROGRESS NOTE  I connected with Brianna Walker on 11/18/18 at  2:30 PM EDT by telephone and verified that I am speaking with the correct person using two identifiers.   I discussed the limitations, risks, security and privacy concerns of performing an evaluation and management service by telemedicine and the availability of in-person appointments. I also discussed with the patient that there may be a patient responsible charge related to this service. The patient expressed understanding and agreed to proceed.   Patient's location: Home Provider's location: Office   Diagnosis: Gastrointestinal stromal tumor  INTERVAL HISTORY:   Brianna Walker is seen today for a telehealth visit.  She was scheduled for an office visit, but requested a WebEx visit.  The visit started as a video visit, but Brianna Walker was unable to access the video meeting.  We proceeded with a telephone visit.  Brianna Walker reports feeling well.  No diarrhea.  No difficulty with bowel function.  She reports several "broken" teeth.   Medications: I have reviewed the patient's current medications.  Assessment/Plan: 1. Gastrointestinal stromal tumor of the rectum  Status post an endoscopic ultrasound on 08/06/2014 confirming a mass abutting the distal rectal wall with an FNA biopsy confirming a gastrointestinal stromal tumor  Initiation of Gleevec 09/04/2014  MRI pelvis 12/15/2014 with interval decreased size of the large anorectal mass with central necrosis.  Continuation of Gleevec  Gleevec placed on hold 06/02/2015 in anticipation of surgery  06/04/2015 status post low anterior resection and diverting loop ileostomy, resection of tumor  Pathology: 9.5 cm tumor; GISTsubtype spindle; mitotic rate 1/50 high-power field; positive margin of resection  Adjuvant Gleevec 08/19/2015 (three-year in total course planned)  Ileostomy takedown  01/28/2016  Held 01/28/2016 through 02/14/2016, resumed 02/15/2016  Gleevec stop 04/11/2016-04/18/2016, and 04/26/2016-05/08/2016  Gleevec resumed at a dose of 200 mg daily on 05/09/2016  Gleevec dose increased to 300 mg daily beginning 06/20/2016  Gleevec dose decreased to 200 mg daily beginning 06/23/2016 (poor tolerance at 300 mg dose)  CTs 11/22/2017-enhancing soft tissue mass in the presacral space measuring 5.4 x 3.5 cm. Enlarged pelvic lymph nodes.  Gleevec continued at a dose of 200 mg daily  Restaging CTs 03/11/2018- interval decrease in size of the presacral soft tissue mass.  Stable small pelvic lymph nodes.  Gleevec discontinued 03/13/2018  CT 11/14/2018- new 2.4 cm left pelvic nodule at the piriformis muscle, stable presacral lymph nodes, right abdominal wall hernia, ventral hernia 2. Remote history of pulmonary embolism-maintained on apixaban 3. Multiple colon polyps noted on the colonoscopy 07/21/2014 with the pathology revealing tubular adenomas 4. Hospitalization 06/21/2015 through 07/06/2015 with nausea/vomiting, high ileostomy output; found to have delayed gastric emptying. 5. Water soluble contrast enema 08/13/2015 positive for a small to moderate volume of water soluble contrast leakage from the low anterior resection and anastomosis site in the pelvis 6. Ileostomy takedown and resection of cystic abdominal wall mass ( benign pathology) on 01/28/2016 7. Anemia with ferritin in the low normal range 05/23/2016 and 06/20/2016. Ferrous sulfate increased to twice daily 06/26/2016.Improved.   Disposition: Brianna Walker has a history of a gastrointestinal tumor of the rectum dating to April 2016.  She completed a course of adjuvant Gleevec in November 2019.  The restaging CT 11/14/2018 reveals a new nodule in the left pelvis.  I reviewed the CT images.  I discussed the CT findings with Brianna Walker.  I will present her case at the GI tumor  conference next week.  The plan is  to resume [place] if there is high concern for recurrence of the gastrointestinal stromal tumor.  She appears asymptomatic at present.  Brianna Walker requested a telephone visit next week.   I discussed the assessment and treatment plan with the patient. The patient was provided an opportunity to ask questions and all were answered. The patient agreed with the plan and demonstrated an understanding of the instructions.   The patient was advised to call back or seek an in-person evaluation if the symptoms worsen or if the condition fails to improve as anticipated.  I provided 20 minutes of telephone, documentation, and chart review time during this encounter, and > 50% was spent counseling as documented under my assessment & plan.  Betsy Coder ANP/GNP-BC   11/18/2018 2:42 PM

## 2018-11-18 NOTE — Telephone Encounter (Signed)
Called patient regarding upcoming Webex appointment, patient Is notified and e-mail has been sent. Day and time approved by providers np.

## 2018-11-18 NOTE — Telephone Encounter (Signed)
Returned TC to patient in regard to wanting a webex visit today instead of having to come in [facility]. Patient stated that nothing is wrong she just did not want to come in the office. I called the patient and let her know that Dr. Benay Spice could do a web visit with her today @2 :74. Also sent a high priority scheduling message for the schedulers to get her scheduled.

## 2018-11-27 ENCOUNTER — Inpatient Hospital Stay: Payer: Medicare HMO | Admitting: Oncology

## 2018-11-27 ENCOUNTER — Telehealth: Payer: Self-pay | Admitting: Oncology

## 2018-11-27 NOTE — Telephone Encounter (Signed)
Contacted patient to verify telephone visit for pre reg °

## 2018-11-28 ENCOUNTER — Telehealth: Payer: Self-pay | Admitting: Oncology

## 2018-11-28 ENCOUNTER — Other Ambulatory Visit: Payer: Self-pay

## 2018-11-28 ENCOUNTER — Inpatient Hospital Stay (HOSPITAL_BASED_OUTPATIENT_CLINIC_OR_DEPARTMENT_OTHER): Payer: Medicare HMO | Admitting: Oncology

## 2018-11-28 ENCOUNTER — Ambulatory Visit
Admission: RE | Admit: 2018-11-28 | Discharge: 2018-11-28 | Disposition: A | Discharge status: Home / Self Care | Payer: Medicare HMO | Source: Ambulatory Visit | Attending: Internal Medicine | Admitting: Internal Medicine

## 2018-11-28 DIAGNOSIS — Z1231 Encounter for screening mammogram for malignant neoplasm of breast: Secondary | ICD-10-CM | POA: Diagnosis not present

## 2018-11-28 DIAGNOSIS — C49A4 Gastrointestinal stromal tumor of large intestine: Secondary | ICD-10-CM | POA: Diagnosis not present

## 2018-11-28 NOTE — Progress Notes (Signed)
[place] OFFICE VISIT PROGRESS NOTE  I connected with Clarita Crane on 11/28/18 at 12:00 PM EDT by telephone and verified that I am speaking with the correct person using two identifiers.   I discussed the limitations, risks, security and privacy concerns of performing an evaluation and management service by telemedicine and the availability of in-person appointments. I also discussed with the patient that there may be a patient responsible charge related to this service. The patient expressed understanding and agreed to proceed.    Patient's location: Home Provider's location: Office   Diagnosis: Gastrointestinal stromal tumor  INTERVAL HISTORY:   Ms. Rivenbark was seen today via a telephone visit in lieu of an in person visit.  This was secondary to the [place] pandemic.  Ms. Downum reports feeling well.  No difficulty with bowel function.  No new complaint.   Lab Results:  Lab Results  Component Value Date   WBC 7.4 04/15/2018   HGB 11.7 (L) 04/15/2018   HCT 35.7 (L) 04/15/2018   MCV 78.4 04/15/2018   PLT 361.0 04/15/2018   NEUTROABS 4.7 04/15/2018    Imaging:  No results found.  Medications: I have reviewed the patient's current medications.  Assessment/Plan: 1. Gastrointestinal stromal tumor of the rectum  Status post an endoscopic ultrasound on 08/06/2014 confirming a mass abutting the distal rectal wall with an FNA biopsy confirming a gastrointestinal stromal tumor  Initiation of Gleevec 09/04/2014  MRI pelvis 12/15/2014 with interval decreased size of the large anorectal mass with central necrosis.  Continuation of Gleevec  Gleevec placed on hold 06/02/2015 in anticipation of surgery  06/04/2015 status post low anterior resection and diverting loop ileostomy, resection of tumor  Pathology: 9.5 cm tumor; GISTsubtype spindle; mitotic rate 1/50 high-power field; positive margin of resection  Adjuvant  Gleevec 08/19/2015 (three-year in total course planned)  Ileostomy takedown 01/28/2016  Held 01/28/2016 through 02/14/2016, resumed 02/15/2016  Gleevec stop 04/11/2016-04/18/2016, and 04/26/2016-05/08/2016  Gleevec resumed at a dose of 200 mg daily on 05/09/2016  Gleevec dose increased to 300 mg daily beginning 06/20/2016  Gleevec dose decreased to 200 mg daily beginning 06/23/2016 (poor tolerance at 300 mg dose)  CTs 11/22/2017-enhancing soft tissue mass in the presacral space measuring 5.4 x 3.5 cm. Enlarged pelvic lymph nodes.  Gleevec continued at a dose of 200 mg daily  Restaging CTs 03/11/2018- interval decrease in size of the presacral soft tissue mass.  Stable small pelvic lymph nodes.  Gleevec discontinued 03/13/2018  CT 11/14/2018- new 2.4 cm left pelvic nodule at the piriformis muscle, stable presacral lymph nodes, right abdominal wall hernia, ventral hernia 2. Remote history of pulmonary embolism-maintained on apixaban 3. Multiple colon polyps noted on the colonoscopy 07/21/2014 with the pathology revealing tubular adenomas 4. Hospitalization 06/21/2015 through 07/06/2015 with nausea/vomiting, high ileostomy output; found to have delayed gastric emptying. 5. Water soluble contrast enema 08/13/2015 positive for a small to moderate volume of water soluble contrast leakage from the low anterior resection and anastomosis site in the pelvis 6. Ileostomy takedown and resection of cystic abdominal wall mass ( benign pathology) on 01/28/2016 7. Anemia with ferritin in the low normal range 05/23/2016 and 06/20/2016. Ferrous sulfate increased to twice daily 06/26/2016.Improved.    Disposition: Ms. Fabry has a history of a gastrointestinal stromal tumor of the rectum.  She completed adjuvant Gleevec in November 2019.  The restaging CT this month revealed a new left pelvic nodule.  I presented her case at the GI tumor  conference yesterday.  The consensus opinion is there is high  suspicion for recurrence of the gastrointestinal stromal tumor.  The lesion is amenable to biopsy via a percutaneous or transrectal approach.  I discussed the apparent diagnosis of recurrent gastrointestinal stromal tumor with Ms. Calamia.  We discussed biopsy options.  We discussed resuming Gleevec.  She does not wish to undergo biopsy.  She would like to resume Gleevec at the previous dose.  Ms. Duggin understands no therapy will be curative if the pelvic lesion represents a recurrence of the gastrointestinal stromal tumor.  The plan is to resume Gleevec at a dose of 200 mg daily.  She will return for a lab visit after approximately 2 weeks of [place] therapy.  She will be scheduled for an office visit after 4-5 weeks of treatment.   I discussed the assessment and treatment plan with the patient. The patient was provided an opportunity to ask questions and all were answered. The patient agreed with the plan and demonstrated an understanding of the instructions.   The patient was advised to call back or seek an in-person evaluation if the symptoms worsen or if the condition fails to improve as anticipated.  I provided 22 minutes of telephone, documentation, and chart review time during this encounter, and > 50% was spent counseling as documented under my assessment & plan.  Betsy Coder ANP/GNP-BC   11/28/2018 12:15 PM

## 2018-11-28 NOTE — Telephone Encounter (Signed)
Called and spoke with patient. Confirmed dates and times °

## 2018-12-02 ENCOUNTER — Telehealth: Payer: Self-pay | Admitting: *Deleted

## 2018-12-02 MED ORDER — IMATINIB MESYLATE 100 MG PO TABS: 200.0000 mg | ORAL_TABLET | Freq: Every day | ORAL | 1 refills | Status: DC

## 2018-12-02 NOTE — Telephone Encounter (Signed)
Called to f/u on refill for Gleevec. Discussion with MD resulted in decision to resume at 200 mg/day. This was confirmed w/MD>

## 2018-12-03 ENCOUNTER — Telehealth: Payer: Self-pay | Admitting: Pharmacist

## 2018-12-03 ENCOUNTER — Telehealth: Payer: Self-pay

## 2018-12-03 MED FILL — IMATINIB MESYLATE 100 MG TA: 100 | 30 days supply | Qty: 60 | Fill #0

## 2018-12-03 NOTE — Telephone Encounter (Signed)
Oral Oncology Patient Advocate Encounter  Confirmed with [facility] that [place] was shipped on 12/03/2018 with a $1.30 copay.   [place] Patient [place] Phone [phone] Fax [phone] 12/03/2018   12:37 PM

## 2018-12-03 NOTE — Telephone Encounter (Signed)
Oral Chemotherapy Pharmacist Encounter   I spoke with patient for overview restart of Gleevec (imatinib) for the treatment of metastatic, recurrent gastrointestinal stromal tumor, planned duration until disease progression or unacceptable toxicity.   Counseled patient on administration, dosing, side effects, monitoring, drug-food interactions, safe handling, storage, and disposal.  Patient will take Gleevec 100mg  tablets, 2 tablets (200mg ) by mouth once daily with a meal and a large glass of water.  Patient knows food may decrease stomach irritation and to maintain hydration while on treatment with Gleevec.  Patient counseled to avoid grapefruit and grapefruit juice.  Gleevec start date: 8/5 or 8/6  Adverse effects include but are not limited to: nausea, vomiting, diarrhea, fatigue, muscle cramps, lower extremity edema, rash, decreased blood counts, GI bleeding, and cardiac dysfunction.  Patient has anti-emetic on hand and knows to take it if nausea develops.   Patient has anti diarrheal on hand and will alert the office of 4 or more loose stools above baseline.  Reviewed with patient importance of keeping a medication schedule and plan for any missed doses.  Medication reconciliation performed and medication/allergy list updated. Patient informed about medication interaction with apixaban. She will alert the office with any increase in bruising or bleeding.  Insurance authorization for [employer] has been obtained. Test claim at the pharmacy revealed copayment $1.30 for 1st fill of imatinib. This will ship from the [facility] on 12/03/2018 to deliver to patient's home on 8/5.  Patient informed the pharmacy will reach out 5-7 days prior to needing next fill of Gleevec to coordinate continued medication acquisition to prevent break in therapy.  All questions answered.  Ms. Hunn voiced understanding and appreciation.   Patient knows to call the office with  questions or concerns.  Johny Drilling, PharmD, BCPS, BCOP  12/03/2018   10:57 AM Oral Oncology Clinic [phone]

## 2018-12-03 NOTE — Telephone Encounter (Signed)
Oral Oncology Pharmacist Encounter  Received new prescription for Gleevec (imatinib) for the treatment of metastatic, recurrent gastrointestinal stromal tumor, planned duration until disease progression or unacceptable toxicity.  Original diagnosis in 2016 Patient received adjuvant imatinib for 3 years, with dose reductions for increased tolerance. Last imatinib in Nov 2019 Recent CT scan shows presumed recurrence and patient is now under evaluation to re-start imatinib at previous tolerable dose of 200 mg once daily  Per notes, patient experienced GI upset (nausea and diarrhea) and altered taste with previous imatinib  Labs from Epic assessed, OK for imatinib.  Current medication list in Epic reviewed, moderate DDIs with imatinib identified:  Imatinib and apixaban and [place] dot: category C interactions: imatinib is a moderate CYP3A4 inhibitor leading to possible decreased metabolism and increased systemic exposure to patient's apixaban, estradiol. Patient will be counseled about increased risk of bruising and bleeding due to apixaban interaction, increased risk of estrogen related adverse events. No change to current therapy is indicated at this time.  Prescription has been e-scribed to the [facility] for benefits analysis and approval on 12/02/18 by collaborative practice RN. Prior authorization is required.  Oral Oncology Clinic will continue to follow for insurance authorization, copayment issues, initial counseling and start date.  Johny Drilling, PharmD, BCPS, BCOP  12/03/2018 9:38 AM Oral Oncology Clinic [phone]

## 2018-12-03 NOTE — Telephone Encounter (Signed)
Oral Oncology Patient Advocate Encounter  Prior Authorization for [place] has been approved.    PA# [id number] Effective dates: 05/01/18 through 05/01/19  Oral Oncology Clinic will continue to follow.   [place] Patient [place] Phone [phone] Fax [phone] 12/03/2018    10:53 AM

## 2018-12-03 NOTE — Telephone Encounter (Signed)
Oral Oncology Patient Advocate Encounter  Received notification from [facility] that prior authorization for [place] is required.  PA submitted on CoverMyMeds Key [id number] Status is pending  Oral Oncology Clinic will continue to follow.  [place] Patient [place] Phone [phone] Fax [phone] 12/03/2018    10:51 AM

## 2018-12-05 DIAGNOSIS — Z01818 Encounter for other preprocedural examination: Secondary | ICD-10-CM | POA: Diagnosis not present

## 2018-12-05 DIAGNOSIS — H2511 Age-related nuclear cataract, right eye: Secondary | ICD-10-CM | POA: Diagnosis not present

## 2018-12-10 NOTE — Progress Notes (Signed)
GI Location of Tumor / Histology:   Brianna Walker presented  CT 11/14/2018: New 2.4 cm left pelvic nodule at the piriformis muscle, stable presacral lymph nodes, right abdominal wall hernia, ventral hernia.  CT 03/11/2018: interval decrease in size of the presacral soft tissue mass.  Stable small pelvic lymph nodes.  CT 11/22/2017: enhancing soft tissue mass in the presacral space measuring 5.4 x 3.5 cm.  Enlarged pelvis lymph nodes.  Colonoscopy 07/21/2014: Multiple colon polyps, pathology revealed tubular adenomas.  Biopsies of   Past/Anticipated interventions by surgeon, if any:   Past/Anticipated interventions by medical oncology, if any:  Brianna Walker 11/28/2018 -She has a history of a gastrointestinal stromal tumor of the rectum.  She completed adjuvant Gleevec in November 2019. -Restaging CT this month revealed a new left pelvic nodule.  I presented her case to GI tumor conference and the consensus opinion is there is hight suspicion for recurrence of the gastrointestinal stromal tumor. -The lesion is amenable to biopsy via percutaneous or transrectal approach. -She does not wish to undergo biopsy, she would like to resume [place] at he previous dose. -The plan is to resume Gleevec at a dose of 200 mg daily.   -She will return for a lab visit after approximately 2 weeks of [place] therapy.  She will be scheduled for an office visit after 4-5 weeks of treatment.  Weight changes, if any: stable  Bowel/Bladder complaints, if any: No  Nausea / Vomiting, if any: No  Pain issues, if any:  No pelvic pain   SAFETY ISSUES:  Prior radiation? No  Pacemaker/ICD? No  Possible current pregnancy? Hysterectomy  Is the patient on methotrexate? No  Current Complaints/Details: 06/04/2015: low anterior resection and diverting loop ileostomy, resection of tumor.- Pathology 9.5 cm tumor, GIST subtype spindle; mitotic rate 1/50 high power field; positive margin of resection.

## 2018-12-11 ENCOUNTER — Ambulatory Visit
Admission: RE | Admit: 2018-12-11 | Discharge: 2018-12-11 | Disposition: A | Discharge status: Home / Self Care | Payer: Medicare HMO | Source: Ambulatory Visit | Attending: Radiation Oncology | Admitting: Radiation Oncology

## 2018-12-11 ENCOUNTER — Encounter: Payer: Self-pay | Admitting: Radiation Oncology

## 2018-12-11 ENCOUNTER — Other Ambulatory Visit: Payer: Self-pay

## 2018-12-11 DIAGNOSIS — C7989 Secondary malignant neoplasm of other specified sites: Secondary | ICD-10-CM | POA: Diagnosis not present

## 2018-12-11 DIAGNOSIS — C499 Malignant neoplasm of connective and soft tissue, unspecified: Secondary | ICD-10-CM

## 2018-12-11 DIAGNOSIS — C49A4 Gastrointestinal stromal tumor of large intestine: Secondary | ICD-10-CM | POA: Diagnosis not present

## 2018-12-11 NOTE — Progress Notes (Signed)
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 A diagnosis of 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 ([place]) was also pertinent to this visit.   HISTORY OF PRESENT ILLNESS: Brianna Walker is a 73 y.o. female seen at the request of Dr. Benay Spice for a history of a gist tumor involving the rectum.  She was originally diagnosed in 2016 and was started on [place] therapy in May 2016.  This improved some of her disease, and she continued this until undergoing low anterior resection with diverting loop ileostomy on 06/04/2015.  Her final pathology was a 9.5 cm tumor consistent with Gist subtype spindle, With a positive margin at the resection site.  She began adjuvant Gleevec for a planned total of 3 years back in April 2017.  She also underwent ileostomy takedown on 01/28/2016 she had some dose reductions along the way with her going back, and in July 2019 and enhancing soft tissue mass in the presacral space was found to measure 5.4 x 3.5 cm with enlarged pelvic lymph nodes.  Restaging imaging in November 2019 revealed an interval decrease in the size of the presacral mass and stable pelvic adenopathy.  CT on 11/14/2018 revealed a 2.4 cm left pelvic nodule within the piriformis muscle as well as stable presacral adenopathy.  She was offered additional biopsy but declined.  Her case was also discussed in GI conference and there was a high clinical suspicion suspicion for recurrent disease.  She is to resume Gleevec, and is seen to consider additional options of radiotherapy.    PREVIOUS RADIATION THERAPY: No   PAST MEDICAL HISTORY:  Past Medical  History:  Diagnosis Date   AKI (acute kidney injury) ([place]) 06/21/2015   ALLERGIC RHINITIS 09/12/2007   Arthritis    BACK PAIN 06/19/2008   Cancer ([place])    dx. 6'72 -oral chemotherapy only.   Colon polyps    Cramp of limb 08/11/2009   DEEP VENOUS THROMBOPHLEBITIS, LEG, RIGHT 03/04/2010   right leg   Diverticulosis    FREQUENCY, URINARY 10/07/2009   GERD 12/05/2006   HEMORRHOIDS 11/17/2006   HIP PAIN, RIGHT, CHRONIC 08/11/2009   HX, PERSONAL, TUBERCULOSIS 11/17/2006   HYPERLIPIDEMIA 09/12/2007   Hyperlipidemia 09/12/2007   Qualifier: Diagnosis of  By: Jenny Reichmann MD, Hunt Oris    HYPERTENSION 11/17/2006   INSOMNIA-SLEEP DISORDER-UNSPEC 06/19/2008   Iron deficiency anemia 12/07/2011   LEG PAIN, RIGHT 06/19/2008   Long term (current) use of anticoagulants 11/29/2010   MASS, SUPERFICIAL 09/12/2007   MENOPAUSAL DISORDER 09/20/2009   OVERACTIVE BLADDER 03/04/2010   Perforation of colon ([place]) 11/17/2015   Pneumonia    PULMONARY EMBOLISM, HX OF 11/17/2006   [facility] s/p Pneumonia   RECTAL BLEEDING 10/07/2007   Rectal mass 06/2014   SCIATICA, RIGHT 09/12/2007   Shortness of breath dyspnea    With exertion since chemotherapy tx-oral meds   SKIN LESION 07/20/2009   TMJ SYNDROME 11/17/2006   Tuberculosis    pt. was tx.       PAST SURGICAL HISTORY: Past Surgical History:  Procedure Laterality Date   ABDOMINAL HYSTERECTOMY     CHOLECYSTECTOMY  OPEN     EUS N/A 08/06/2014   Procedure: LOWER ENDOSCOPIC ULTRASOUND (EUS);  Surgeon: Milus Banister, MD;  Location: [facility];  Service: Endoscopy;  Laterality: N/A;   EXCISION MASS ABDOMINAL N/A 01/28/2016   Procedure: EXCISION  ABDOMINAL WALL MASS, CYST, QUESTION FAT NECROSIS, QUESTION SEROMA;  Surgeon: Michael Boston, MD;  Location: [facility];  Service: General;  Laterality: N/A;   hemorroid surgery  removed at dr Silvio Pate office   x 2   ILEOSTOMY CLOSURE N/A 01/28/2016   Procedure: TAKEDOWN OF LOOP ILEOSTOMY;  Surgeon:  Michael Boston, MD;  Location: [facility];  Service: General;  Laterality: N/A;   [place]  2002     multiple ventral hernia repair/mesh   INCISIONAL HERNIA REPAIR  01/13/2005   open w onlay Proceed mesh.   LAPAROSCOPIC LYSIS OF ADHESIONS  06/04/2015   Procedure: LAPAROSCOPIC LYSIS OF ADHESIONS;  Surgeon: Michael Boston, MD;  Location: [facility];  Service: General;;   PROCTOSCOPY N/A 01/28/2016   Procedure: RIGID PROCTOSCOPY, DILATION OF COLORECTAL ANASTOMOTIC STRICTURE;  Surgeon: Michael Boston, MD;  Location: [facility];  Service: General;  Laterality: N/A;   s/p right hip replaced min invasive hip surgury [facility] oct 2011 Right 2011   tumor removed off of right shoulder Right    XI ROBOTIC ASSISTED LOWER ANTERIOR RESECTION N/A 06/04/2015   Procedure: XI ROBOTIC ASSISTED LYSIS OF ADHESIONS, LOWER ANTERIOR RESECTION TRANS ABDOMINAL AND TRANS ANAL, COLOANAL HANDSEWN ANASTAOSIS, DIVERTING LOPE ILIOSTOMY, RESECTION GIST TUMOR;  Surgeon: Michael Boston, MD;  Location: [facility];  Service: General;  Laterality: N/A;     FAMILY HISTORY:  Family History  Problem Relation Age of Onset   Colon cancer Father    Heart disease Mother    Heart disease Sister    Diabetes Sister      SOCIAL HISTORY:  reports that she quit smoking about 41 years ago. Her smoking use included cigarettes. She smoked 0.00 packs per day. She has never used smokeless tobacco. She reports current alcohol use. She reports that she does not use drugs.   ALLERGIES: Patient has no known allergies.   MEDICATIONS:  Current Outpatient Medications  Medication Sig Dispense Refill   acetaminophen (TYLENOL) 500 MG tablet Take 1,000 mg by mouth every 6 (six) hours as needed for moderate pain or headache.     aspirin 81 MG tablet Take 81 mg by mouth at bedtime.      benazepril (LOTENSIN) 40 MG tablet TAKE 1 TABLET EVERY EVENING (NEED MD APPOINTMENT) 90 tablet 0   ELIQUIS 5 MG TABS tablet TAKE 1 TABLET TWICE DAILY (NEED MD  APPOINTMENT) 180 tablet 3   estradiol (VIVELLE-DOT) 0.025 MG/24HR APPLY 1 PATCH ONTO THE SKIN ONE TIME WEEKLY 8 patch 0   ferrous sulfate 325 (65 FE) MG tablet Take 1 tablet (325 mg total) by mouth 2 (two) times daily with a meal. 180 tablet 3   imatinib (GLEEVEC) 100 MG tablet Take 2 tablets (200 mg total) by mouth daily. Take with meals and large glass of water.Caution:Chemotherapy 60 tablet 1   loperamide (IMODIUM) 2 MG capsule Take 1-2 capsules (2-4 mg total) by mouth every 8 (eight) hours as needed for diarrhea or loose stools (Use if >2 BM every 8 hours). 30 capsule 0   LORazepam (ATIVAN) 0.5 MG tablet Take 1 tablet (0.5 mg total) by mouth at bedtime as needed for anxiety. 30 tablet 0   meloxicam (MOBIC) 15 MG tablet Take 1 tablet (15 mg total)  by mouth daily as needed for pain. 30 tablet 5   nystatin (MYCOSTATIN/NYSTOP) powder Apply topically 3 (three) times daily as needed. 15 g 0   omeprazole (PRILOSEC) 20 MG capsule TAKE 1 CAPSULE EVERY DAY (NEED MD APPOINTMENT) 90 capsule 3   potassium chloride SA (K-DUR,KLOR-CON) 20 MEQ tablet Take 1 tablet twice daily for 3 days, then take 1 tablet by mouth daily. 30 tablet 1   pravastatin (PRAVACHOL) 20 MG tablet TAKE 1 TABLET AT BEDTIME (NEED MD APPOINTMENT) 90 tablet 0   Propylhexedrine (BENZEDREX) INHA Place 1 each into the nose daily as needed (congestion).     psyllium (HYDROCIL/METAMUCIL) 95 % PACK Take 1 packet by mouth daily. 56 each 0   traMADol (ULTRAM) 50 MG tablet Take 1-2 tablets (50-100 mg total) by mouth every 6 (six) hours as needed for moderate pain. 40 tablet 1   Vitamin D, Ergocalciferol, (DRISDOL) 50000 units CAPS capsule Take 1 capsule (50,000 Units total) by mouth every 7 (seven) days. 12 capsule 0   No current facility-administered medications for this encounter.      REVIEW OF SYSTEMS: On review of systems, the patient reports that she is doing well overall. She denies any chest pain, shortness of breath, cough,  fevers, chills, night sweats, unintended weight changes. She denies any bowel or bladder disturbances, and denies abdominal pain, nausea or vomiting. She denies any new musculoskeletal or joint aches or pains. Her only hip pain is located in her prior replaced joint.  A complete review of systems is obtained and is otherwise negative.     PHYSICAL EXAM:  Unable to assess vitals  In general this is a well appearing African American female in no acute distress. She's alert and oriented x4 and appropriate throughout the examination. Cardiopulmonary assessment is negative for acute distress and she exhibits normal effort.     ECOG = 0  0 - Asymptomatic (Fully active, able to carry on all predisease activities without restriction)  1 - Symptomatic but completely ambulatory (Restricted in physically strenuous activity but ambulatory and able to carry out work of a light or sedentary nature. For example, light housework, office work)  2 - Symptomatic, <50% in bed during the day (Ambulatory and capable of all self care but unable to carry out any work activities. Up and about more than 50% of waking hours)  3 - Symptomatic, >50% in bed, but not bedbound (Capable of only limited self-care, confined to bed or chair 50% or more of waking hours)  4 - Bedbound (Completely disabled. Cannot carry on any self-care. Totally confined to bed or chair)  5 - Death   Eustace Pen MM, Creech RH, Tormey DC, et al. [phone]). "Toxicity and response criteria of the [facility]". [place] Oncol. 5 (6): 649-55    LABORATORY DATA:  Lab Results  Component Value Date   WBC 7.4 04/15/2018   HGB 11.7 (L) 04/15/2018   HCT 35.7 (L) 04/15/2018   MCV 78.4 04/15/2018   PLT 361.0 04/15/2018   Lab Results  Component Value Date   NA 142 11/14/2018   K 3.8 11/14/2018   CL 107 11/14/2018   CO2 25 11/14/2018   Lab Results  Component Value Date   ALT 7 11/14/2018   AST 12 (L) 11/14/2018   ALKPHOS  148 (H) 11/14/2018   BILITOT 0.7 11/14/2018      RADIOGRAPHY: Ct Abdomen Pelvis Wo Contrast  Result Date: 11/14/2018 CLINICAL DATA:  Gastrointestinal stromal tumor.  Surgical  resection. EXAM: CT ABDOMEN AND PELVIS WITHOUT CONTRAST TECHNIQUE: Multidetector CT imaging of the abdomen and pelvis was performed following the standard protocol without IV contrast. COMPARISON:  03/11/2018 FINDINGS: Lower chest: Lung bases are clear. Hepatobiliary: No focal hepatic lesion on noncontrast exam. Postcholecystectomy. Pancreas: Pancreas is normal. No ductal dilatation. No pancreatic inflammation. Spleen: Normal spleen Adrenals/urinary tract: Adrenal glands normal. No nephrolithiasis ureterolithiasis. No obstructive uropathy. Bladder Stomach/Bowel: Small hiatal hernia. Stomach, small bowel cecum normal. Cecum normal. Ascending colon is collapse. There is of ventral abdominal hernia which the transverse colon enters. More distal LEFT colon is normal. Rectum normal. The small bowel exits the abdominal cavity through a lateral RIGHT abdominal wall hernia. There is a long segment nonobstructed small bowel within this hernia sac which presumably represented a peristomal at the prior colostomy. Vascular/Lymphatic: Abdominal aorta is normal caliber. No periportal or retroperitoneal adenopathy. No pelvic adenopathy. Several small 8 mm lymph nodes in the peritoneal fat anterior to the lumbosacral junction and are similar to prior. Reproductive: Post hysterectomy. Other: In the deep LEFT pelvis along the piriformis muscle there is a new 2.4 x 2.0 cm round soft tissue nodule lesion (image 75/2). There is a reduction in soft tissue thickening presacral space which abuts rectum. Musculoskeletal: No aggressive osseous lesion. IMPRESSION: 1. Unfortunately, there is a new soft tissue rounded nodule (2.5 cm) in the deep LEFT pelvis concerning for gastrointestinal stromal tumor recurrence. 2. No bowel obstruction. 3. Small presacral lymph  nodes are similar. 4. Large RIGHT abdominal wall hernia containing nonobstructed small bowel at site of prior ostomy and peristomal hernia. 5. Smaller midline ventral hernia contains a loop of nonobstructed transverse colon Electronically Signed   By: Suzy Bouchard M.D.   On: 11/14/2018 16:19   Mm 3d Screen Breast Bilateral  Result Date: 11/29/2018 CLINICAL DATA:  Screening. EXAM: DIGITAL SCREENING BILATERAL MAMMOGRAM WITH TOMO AND CAD COMPARISON:  Previous exam(s). ACR Breast Density Category b: There are scattered areas of fibroglandular density. FINDINGS: There are no findings suspicious for malignancy. Images were processed with CAD. IMPRESSION: No mammographic evidence of malignancy. A result letter of this screening mammogram will be mailed directly to the patient. RECOMMENDATION: Screening mammogram in one year. (Code:SM-B-01Y) BI-RADS CATEGORY  1: Negative. Electronically Signed   By: Curlene Dolphin M.D.   On: 11/29/2018 09:26       IMPRESSION/PLAN: 1. Progressive metastatic GIST tumor arising in the rectum with disease in the pelvic soft tissue. Dr. Lisbeth Renshaw discusses the pathology findings and reviews the nature of GIST tumors. He discusses the options of radiotherapy as it pertains to these types of tumors. It would certainly be an option to consider a stereotactic body radiotherapy (SBRT) approach. She is hoping that resuming Gleevac which she just started back would control her disease, but would be agreeable to consider SBRT style treatment if her disease did not respond to the [place]. We discussed the risks, benefits, short, and long term effects of radiotherapy. Dr. Lisbeth Renshaw discusses the delivery and logistics of radiotherapy and anticipates a course of 5 fractions of radiotherapy. We will see her as needed per Dr. Learta Codding.   This encounter was provided by telemedicine platform MyChart.  The patient has given verbal consent for this type of encounter and has been advised to only accept a  meeting of this type in a secure network environment. The time spent during this encounter was 45 minutes. The attendants for this meeting include Blenda Nicely, RN, Dr. Lisbeth Renshaw, Hayden Pedro  and [place].  During the encounter,  Blenda Nicely, RN, Dr. Lisbeth Renshaw, and Hayden Pedro were located at [facility] Radiation Oncology Department.  [place] was located at home.    The above documentation reflects my direct findings during this shared patient visit. Please see the separate note by Dr. Lisbeth Renshaw on this date for the remainder of the patient's plan of care.    Carola Rhine, PAC

## 2018-12-16 ENCOUNTER — Telehealth: Payer: Self-pay | Admitting: *Deleted

## 2018-12-16 NOTE — Telephone Encounter (Signed)
Called to ask the purpose of her labs on 12/17/18. Informed her MD wanted to check counts after 2 weeks of resuming Gleevec. She agreed to reschedule the lab.

## 2018-12-17 ENCOUNTER — Other Ambulatory Visit: Payer: Medicare HMO

## 2018-12-17 ENCOUNTER — Inpatient Hospital Stay: Payer: Medicare HMO | Attending: Oncology

## 2018-12-17 ENCOUNTER — Other Ambulatory Visit: Payer: Self-pay

## 2018-12-17 ENCOUNTER — Other Ambulatory Visit: Payer: Self-pay | Admitting: *Deleted

## 2018-12-17 DIAGNOSIS — C49A4 Gastrointestinal stromal tumor of large intestine: Secondary | ICD-10-CM | POA: Diagnosis not present

## 2018-12-17 DIAGNOSIS — C49A5 Gastrointestinal stromal tumor of rectum: Secondary | ICD-10-CM | POA: Diagnosis present

## 2018-12-17 LAB — CMP (CANCER CENTER ONLY)
ALT: 10 U/L (ref 0–44)
AST: 14 U/L — ABNORMAL LOW (ref 15–41)
Albumin: 3.3 g/dL — ABNORMAL LOW (ref 3.5–5.0)
Alkaline Phosphatase: 139 U/L — ABNORMAL HIGH (ref 38–126)
Anion gap: 11 (ref 5–15)
BUN: 12 mg/dL (ref 8–23)
CO2: 23 mmol/L (ref 22–32)
Calcium: 8.6 mg/dL — ABNORMAL LOW (ref 8.9–10.3)
Chloride: 109 mmol/L (ref 98–111)
Creatinine: 1.3 mg/dL — ABNORMAL HIGH (ref 0.44–1.00)
GFR, Est AFR Am: 48 mL/min — ABNORMAL LOW (ref >60)
GFR, Estimated: 41 mL/min — ABNORMAL LOW (ref >60)
Glucose, Bld: 105 mg/dL — ABNORMAL HIGH (ref 70–99)
Potassium: 3.9 mmol/L (ref 3.5–5.1)
Sodium: 143 mmol/L (ref 135–145)
Total Bilirubin: 0.5 mg/dL (ref 0.3–1.2)
Total Protein: 8 g/dL (ref 6.5–8.1)

## 2018-12-17 LAB — CBC WITH DIFFERENTIAL (CANCER CENTER ONLY)
Abs Immature Granulocytes: 0.03 10*3/uL (ref 0.00–0.07)
Basophils Absolute: 0.1 10*3/uL (ref 0.0–0.1)
Basophils Relative: 1 %
Eosinophils Absolute: 0.1 10*3/uL (ref 0.0–0.5)
Eosinophils Relative: 2 %
HCT: 36.7 % (ref 36.0–46.0)
Hemoglobin: 11.3 g/dL — ABNORMAL LOW (ref 12.0–15.0)
Immature Granulocytes: 0 %
Lymphocytes Relative: 22 %
Lymphs Abs: 1.6 10*3/uL (ref 0.7–4.0)
MCH: 23.4 pg — ABNORMAL LOW (ref 26.0–34.0)
MCHC: 30.8 g/dL (ref 30.0–36.0)
MCV: 76.1 fL — ABNORMAL LOW (ref 80.0–100.0)
Monocytes Absolute: 0.3 10*3/uL (ref 0.1–1.0)
Monocytes Relative: 4 %
Neutro Abs: 5.1 10*3/uL (ref 1.7–7.7)
Neutrophils Relative %: 71 %
Platelet Count: 416 10*3/uL — ABNORMAL HIGH (ref 150–400)
RBC: 4.82 MIL/uL (ref 3.87–5.11)
RDW: 17.1 % — ABNORMAL HIGH (ref 11.5–15.5)
WBC Count: 7.1 10*3/uL (ref 4.0–10.5)
nRBC: 0 % (ref 0.0–0.2)

## 2018-12-17 LAB — MAGNESIUM: Magnesium: 1.7 mg/dL (ref 1.7–2.4)

## 2018-12-17 NOTE — Progress Notes (Signed)
Not able to add ferritin to lab on 12/17/18. Added to next lab appointment.

## 2018-12-18 ENCOUNTER — Other Ambulatory Visit: Payer: Self-pay | Admitting: Internal Medicine

## 2018-12-30 MED FILL — IMATINIB MESYLATE 100 MG TA: 100 | 30 days supply | Qty: 60 | Fill #1

## 2018-12-31 ENCOUNTER — Telehealth: Payer: Self-pay | Admitting: *Deleted

## 2018-12-31 NOTE — Telephone Encounter (Signed)
Received vm message from patient inquiring about about her upcoming appt. TCT patient and spoke with her.  She is asking about the reason for this appt.  Explained it is a follow up appt due to the start of the [place] on 12/02/18.  Explained that this was noraml f/u.  She coiced understanding. No further questions. She did say she was having cataract surgery the next day, 01/03/19

## 2019-01-02 ENCOUNTER — Telehealth: Payer: Self-pay | Admitting: Nurse Practitioner

## 2019-01-02 ENCOUNTER — Encounter: Payer: Self-pay | Admitting: Nurse Practitioner

## 2019-01-02 ENCOUNTER — Inpatient Hospital Stay: Payer: Medicare HMO | Attending: Oncology | Admitting: Nurse Practitioner

## 2019-01-02 ENCOUNTER — Inpatient Hospital Stay: Payer: Medicare HMO

## 2019-01-02 ENCOUNTER — Other Ambulatory Visit: Payer: Self-pay

## 2019-01-02 VITALS — BP 124/64 | HR 100 | Temp 98.3°F | Resp 18 | Ht 63.5 in | Wt 202.8 lb

## 2019-01-02 DIAGNOSIS — C49A4 Gastrointestinal stromal tumor of large intestine: Secondary | ICD-10-CM

## 2019-01-02 DIAGNOSIS — R197 Diarrhea, unspecified: Secondary | ICD-10-CM | POA: Insufficient documentation

## 2019-01-02 LAB — CMP (CANCER CENTER ONLY)
ALT: 8 U/L (ref 0–44)
AST: 15 U/L (ref 15–41)
Albumin: 3.4 g/dL — ABNORMAL LOW (ref 3.5–5.0)
Alkaline Phosphatase: 140 U/L — ABNORMAL HIGH (ref 38–126)
Anion gap: 9 (ref 5–15)
BUN: 14 mg/dL (ref 8–23)
CO2: 22 mmol/L (ref 22–32)
Calcium: 8.7 mg/dL — ABNORMAL LOW (ref 8.9–10.3)
Chloride: 111 mmol/L (ref 98–111)
Creatinine: 1.23 mg/dL — ABNORMAL HIGH (ref 0.44–1.00)
GFR, Est AFR Am: 51 mL/min — ABNORMAL LOW (ref >60)
GFR, Estimated: 44 mL/min — ABNORMAL LOW (ref >60)
Glucose, Bld: 106 mg/dL — ABNORMAL HIGH (ref 70–99)
Potassium: 3.8 mmol/L (ref 3.5–5.1)
Sodium: 142 mmol/L (ref 135–145)
Total Bilirubin: 0.5 mg/dL (ref 0.3–1.2)
Total Protein: 8.2 g/dL — ABNORMAL HIGH (ref 6.5–8.1)

## 2019-01-02 LAB — CBC WITH DIFFERENTIAL (CANCER CENTER ONLY)
Abs Immature Granulocytes: 0.03 10*3/uL (ref 0.00–0.07)
Basophils Absolute: 0 10*3/uL (ref 0.0–0.1)
Basophils Relative: 1 %
Eosinophils Absolute: 0.1 10*3/uL (ref 0.0–0.5)
Eosinophils Relative: 2 %
HCT: 36.9 % (ref 36.0–46.0)
Hemoglobin: 11.4 g/dL — ABNORMAL LOW (ref 12.0–15.0)
Immature Granulocytes: 0 %
Lymphocytes Relative: 20 %
Lymphs Abs: 1.6 10*3/uL (ref 0.7–4.0)
MCH: 23.4 pg — ABNORMAL LOW (ref 26.0–34.0)
MCHC: 30.9 g/dL (ref 30.0–36.0)
MCV: 75.8 fL — ABNORMAL LOW (ref 80.0–100.0)
Monocytes Absolute: 0.4 10*3/uL (ref 0.1–1.0)
Monocytes Relative: 5 %
Neutro Abs: 5.9 10*3/uL (ref 1.7–7.7)
Neutrophils Relative %: 72 %
Platelet Count: 393 10*3/uL (ref 150–400)
RBC: 4.87 MIL/uL (ref 3.87–5.11)
RDW: 17.7 % — ABNORMAL HIGH (ref 11.5–15.5)
WBC Count: 8.1 10*3/uL (ref 4.0–10.5)
nRBC: 0 % (ref 0.0–0.2)

## 2019-01-02 LAB — FERRITIN: Ferritin: 7 ng/mL — ABNORMAL LOW (ref 11–307)

## 2019-01-02 LAB — MAGNESIUM: Magnesium: 1.9 mg/dL (ref 1.7–2.4)

## 2019-01-02 NOTE — Progress Notes (Addendum)
[place] OFFICE PROGRESS NOTE   Diagnosis: Gastrointestinal stromal tumor  INTERVAL HISTORY:   Brianna Walker returns as scheduled.  She resumed Gleevec at a dose of 200 mg daily on 12/04/2018.  She denies nausea/vomiting.  No mouth sores.  She has occasional diarrhea which tends to be diet related.  No skin rash.  She has a good appetite.  No abdominal pain.  She denies bleeding.  No black stools.  Objective:  Vital signs in last 24 hours:  Blood pressure 124/64, pulse 100, temperature 98.3 F (36.8 C), temperature source Oral, resp. rate 18, height 5' 3.5" (1.613 m), weight 202 lb 12.8 oz (92 kg), SpO2 100 %.    HEENT: No thrush or ulcers. GI: Abdomen is soft, nontender.  No hepatomegaly.  Right mid to lower abdomen hernia. Vascular: No leg edema. Neuro: Alert and oriented. Skin: No rash.   Lab Results:  Lab Results  Component Value Date   WBC 8.1 01/02/2019   HGB 11.4 (L) 01/02/2019   HCT 36.9 01/02/2019   MCV 75.8 (L) 01/02/2019   PLT 393 01/02/2019   NEUTROABS 5.9 01/02/2019    Imaging:  No results found.  Medications: I have reviewed the patient's current medications.  Assessment/Plan: 1. Gastrointestinal stromal tumor of the rectum  Status post an endoscopic ultrasound on 08/06/2014 confirming a mass abutting the distal rectal wall with an FNA biopsy confirming a gastrointestinal stromal tumor  Initiation of Gleevec 09/04/2014  MRI pelvis 12/15/2014 with interval decreased size of the large anorectal mass with central necrosis.  Continuation of Gleevec  Gleevec placed on hold 06/02/2015 in anticipation of surgery  06/04/2015 status post low anterior resection and diverting loop ileostomy, resection of tumor  Pathology: 9.5 cm tumor; GISTsubtype spindle; mitotic rate 1/50 high-power field; positive margin of resection  Adjuvant Gleevec 08/19/2015 (three-year in total course planned)  Ileostomy takedown 01/28/2016  Held 01/28/2016  through 02/14/2016, resumed 02/15/2016  Gleevec stop 04/11/2016-04/18/2016, and 04/26/2016-05/08/2016  Gleevec resumed at a dose of 200 mg daily on 05/09/2016  Gleevec dose increased to 300 mg daily beginning 06/20/2016  Gleevec dose decreased to 200 mg daily beginning 06/23/2016 (poor tolerance at 300 mg dose)  CTs 11/22/2017-enhancing soft tissue mass in the presacral space measuring 5.4 x 3.5 cm. Enlarged pelvic lymph nodes.  Gleevec continued at a dose of 200 mg daily  Restaging CTs 03/11/2018- interval decrease in size of the presacral soft tissue mass.  Stable small pelvic lymph nodes.  Gleevec discontinued 03/13/2018  CT 11/14/2018- new 2.4 cm left pelvic nodule at the piriformis muscle, stable presacral lymph nodes, right abdominal wall hernia, ventral hernia  Gleevec 200 mg daily 12/04/2018 2. Remote history of pulmonary embolism-maintained on apixaban 3. Multiple colon polyps noted on the colonoscopy 07/21/2014 with the pathology revealing tubular adenomas 4. Hospitalization 06/21/2015 through 07/06/2015 with nausea/vomiting, high ileostomy output; found to have delayed gastric emptying. 5. Water soluble contrast enema 08/13/2015 positive for a small to moderate volume of water soluble contrast leakage from the low anterior resection and anastomosis site in the pelvis 6. Ileostomy takedown and resection of cystic abdominal wall mass ( benign pathology) on 01/28/2016 7. Anemia with ferritin in the low normal range 05/23/2016 and 06/20/2016. Ferrous sulfate increased to twice daily 06/26/2016.Improved.  Disposition: Brianna Walker appears stable.  She resumed Gleevec about a month ago.  She is tolerating well.  Plan to continue the same.  She will be referred for restaging CTs after she has been on [place] for  approximately 3 months.  We reviewed the CBC from today.  She has a mild microcytic anemia.  We will follow-up on the ferritin from today.  White count and platelets in  normal range.  She will return for lab and follow-up in 1 month.  She will contact the office in the interim with any problems.  Patient seen with Dr. Benay Spice.    Ned Card ANP/GNP-BC   01/02/2019  2:05 PM  This was a shared visit with Ned Card.  Brianna Walker is tolerating the [place] well.  Julieanne Manson, MD

## 2019-01-02 NOTE — Telephone Encounter (Signed)
Scheduled per los. Patient declined printout  

## 2019-01-03 ENCOUNTER — Telehealth: Payer: Self-pay | Admitting: *Deleted

## 2019-01-03 DIAGNOSIS — H2511 Age-related nuclear cataract, right eye: Secondary | ICD-10-CM | POA: Diagnosis not present

## 2019-01-03 DIAGNOSIS — H25811 Combined forms of age-related cataract, right eye: Secondary | ICD-10-CM | POA: Diagnosis not present

## 2019-01-03 NOTE — Telephone Encounter (Signed)
-----   Message from Owens Shark, NP sent at 01/03/2019  8:44 AM EDT ----- Please let her know labs confirm iron deficiency.  She should begin ferrous sulfate 325 mg twice daily.  She needs to submit stool cards for Hemoccult testing to our office in the next 2 weeks.  She will have to pick up the stool cards from our office.  Please find out who her gastroenterologist is, contact their office for report most recent endoscopic evaluation.  Thanks

## 2019-01-03 NOTE — Telephone Encounter (Signed)
Per Ned Card, NP, called pt regarding iron deficiency. Left message with pt as well as a message on pt MYCHART. Awaiting callback.

## 2019-01-09 ENCOUNTER — Telehealth: Payer: Self-pay

## 2019-01-09 NOTE — Telephone Encounter (Signed)
TC to pt per Lattie Haw to let her know  labs confirm iron deficiency. She should begin ferrous sulfate 325 mg twice daily. She needs to submit stool cards for Hemoccult testing to our office in the next 2 weeks. She will have to pick up the stool cards from our office. Patient verbalized that she would come next week to pick up stool cards from office.

## 2019-01-09 NOTE — Telephone Encounter (Signed)
Left message for patient to call back [facility] 

## 2019-01-17 DIAGNOSIS — H2512 Age-related nuclear cataract, left eye: Secondary | ICD-10-CM | POA: Diagnosis not present

## 2019-01-17 DIAGNOSIS — H25812 Combined forms of age-related cataract, left eye: Secondary | ICD-10-CM | POA: Diagnosis not present

## 2019-01-18 ENCOUNTER — Other Ambulatory Visit: Payer: Self-pay | Admitting: Internal Medicine

## 2019-01-27 ENCOUNTER — Other Ambulatory Visit: Payer: Self-pay | Admitting: Oncology

## 2019-01-30 ENCOUNTER — Other Ambulatory Visit: Payer: Self-pay

## 2019-01-30 ENCOUNTER — Inpatient Hospital Stay: Payer: Medicare HMO | Attending: Oncology | Admitting: Oncology

## 2019-01-30 ENCOUNTER — Inpatient Hospital Stay: Payer: Medicare HMO

## 2019-01-30 VITALS — BP 130/70 | HR 90 | Temp 98.3°F | Resp 18 | Ht 63.5 in | Wt 201.7 lb

## 2019-01-30 DIAGNOSIS — E785 Hyperlipidemia, unspecified: Secondary | ICD-10-CM | POA: Diagnosis not present

## 2019-01-30 DIAGNOSIS — Z7901 Long term (current) use of anticoagulants: Secondary | ICD-10-CM | POA: Insufficient documentation

## 2019-01-30 DIAGNOSIS — I10 Essential (primary) hypertension: Secondary | ICD-10-CM | POA: Insufficient documentation

## 2019-01-30 DIAGNOSIS — Z791 Long term (current) use of non-steroidal anti-inflammatories (NSAID): Secondary | ICD-10-CM | POA: Insufficient documentation

## 2019-01-30 DIAGNOSIS — R197 Diarrhea, unspecified: Secondary | ICD-10-CM | POA: Diagnosis not present

## 2019-01-30 DIAGNOSIS — Z23 Encounter for immunization: Secondary | ICD-10-CM

## 2019-01-30 DIAGNOSIS — Z86711 Personal history of pulmonary embolism: Secondary | ICD-10-CM | POA: Diagnosis not present

## 2019-01-30 DIAGNOSIS — K219 Gastro-esophageal reflux disease without esophagitis: Secondary | ICD-10-CM | POA: Diagnosis not present

## 2019-01-30 DIAGNOSIS — C49A4 Gastrointestinal stromal tumor of large intestine: Secondary | ICD-10-CM

## 2019-01-30 DIAGNOSIS — Z79899 Other long term (current) drug therapy: Secondary | ICD-10-CM | POA: Insufficient documentation

## 2019-01-30 DIAGNOSIS — Z7982 Long term (current) use of aspirin: Secondary | ICD-10-CM | POA: Diagnosis not present

## 2019-01-30 LAB — CBC WITH DIFFERENTIAL (CANCER CENTER ONLY)
Abs Immature Granulocytes: 0.02 10*3/uL (ref 0.00–0.07)
Basophils Absolute: 0 10*3/uL (ref 0.0–0.1)
Basophils Relative: 1 %
Eosinophils Absolute: 0.1 10*3/uL (ref 0.0–0.5)
Eosinophils Relative: 2 %
HCT: 36.8 % (ref 36.0–46.0)
Hemoglobin: 11.4 g/dL — ABNORMAL LOW (ref 12.0–15.0)
Immature Granulocytes: 0 %
Lymphocytes Relative: 20 %
Lymphs Abs: 1.5 10*3/uL (ref 0.7–4.0)
MCH: 24.3 pg — ABNORMAL LOW (ref 26.0–34.0)
MCHC: 31 g/dL (ref 30.0–36.0)
MCV: 78.3 fL — ABNORMAL LOW (ref 80.0–100.0)
Monocytes Absolute: 0.4 10*3/uL (ref 0.1–1.0)
Monocytes Relative: 6 %
Neutro Abs: 5.2 10*3/uL (ref 1.7–7.7)
Neutrophils Relative %: 71 %
Platelet Count: 349 10*3/uL (ref 150–400)
RBC: 4.7 MIL/uL (ref 3.87–5.11)
RDW: 19.9 % — ABNORMAL HIGH (ref 11.5–15.5)
WBC Count: 7.3 10*3/uL (ref 4.0–10.5)
nRBC: 0 % (ref 0.0–0.2)

## 2019-01-30 LAB — CMP (CANCER CENTER ONLY)
ALT: 8 U/L (ref 0–44)
AST: 12 U/L — ABNORMAL LOW (ref 15–41)
Albumin: 3.4 g/dL — ABNORMAL LOW (ref 3.5–5.0)
Alkaline Phosphatase: 132 U/L — ABNORMAL HIGH (ref 38–126)
Anion gap: 7 (ref 5–15)
BUN: 11 mg/dL (ref 8–23)
CO2: 26 mmol/L (ref 22–32)
Calcium: 8.7 mg/dL — ABNORMAL LOW (ref 8.9–10.3)
Chloride: 108 mmol/L (ref 98–111)
Creatinine: 1.04 mg/dL — ABNORMAL HIGH (ref 0.44–1.00)
GFR, Est AFR Am: >60 mL/min (ref >60)
GFR, Estimated: 54 mL/min — ABNORMAL LOW (ref >60)
Glucose, Bld: 86 mg/dL (ref 70–99)
Potassium: 3.6 mmol/L (ref 3.5–5.1)
Sodium: 141 mmol/L (ref 135–145)
Total Bilirubin: 0.9 mg/dL (ref 0.3–1.2)
Total Protein: 7.8 g/dL (ref 6.5–8.1)

## 2019-01-30 MED ORDER — INFLUENZA VAC A&B SA ADJ QUAD 0.5 ML IM PRSY
PREFILLED_SYRINGE | INTRAMUSCULAR | Status: AC
  Filled 2019-01-30: qty 0.5

## 2019-01-30 MED ORDER — INFLUENZA VAC A&B SA ADJ QUAD 0.5 ML IM PRSY
0.5000 mL | PREFILLED_SYRINGE | Freq: Once | INTRAMUSCULAR | Status: AC
  Administered 2019-01-30: 0.5 mL via INTRAMUSCULAR

## 2019-01-30 MED FILL — IMATINIB MESYLATE 100 MG TA: 100 | 30 days supply | Qty: 60 | Fill #0

## 2019-01-30 NOTE — Progress Notes (Signed)
[place] OFFICE PROGRESS NOTE   Diagnosis: Gastrointestinal stromal tumor  INTERVAL HISTORY:   Brianna Walker returns as scheduled.  She continues Gleevec.  No diarrhea, rash, or swelling.  No pelvic pain.  No complaint.  Objective:  Vital signs in last 24 hours:  Blood pressure 130/70, pulse 90, temperature 98.3 F (36.8 C), temperature source Oral, resp. rate 18, height 5' 3.5" (1.613 m), weight 201 lb 11.2 oz (91.5 kg), SpO2 100 %.    HEENT: No periorbital edema GI: No hepatomegaly, soft rounded fullness in the right lower abdomen, nontender Vascular: No leg edema  Skin: No rash  Lab Results:  Lab Results  Component Value Date   WBC 7.3 01/30/2019   HGB 11.4 (L) 01/30/2019   HCT 36.8 01/30/2019   MCV 78.3 (L) 01/30/2019   PLT 349 01/30/2019   NEUTROABS 5.2 01/30/2019    CMP  Lab Results  Component Value Date   NA 141 01/30/2019   K 3.6 01/30/2019   CL 108 01/30/2019   CO2 26 01/30/2019   GLUCOSE 86 01/30/2019   BUN 11 01/30/2019   CREATININE 1.04 (H) 01/30/2019   CALCIUM 8.7 (L) 01/30/2019   PROT 7.8 01/30/2019   ALBUMIN 3.4 (L) 01/30/2019   AST 12 (L) 01/30/2019   ALT 8 01/30/2019   ALKPHOS 132 (H) 01/30/2019   BILITOT 0.9 01/30/2019   GFRNONAA 54 (L) 01/30/2019   GFRAA >60 01/30/2019     Medications: I have reviewed the patient's current medications.   Assessment/Plan: 1. Gastrointestinal stromal tumor of the rectum  Status post an endoscopic ultrasound on 08/06/2014 confirming a mass abutting the distal rectal wall with an FNA biopsy confirming a gastrointestinal stromal tumor  Initiation of Gleevec 09/04/2014  MRI pelvis 12/15/2014 with interval decreased size of the large anorectal mass with central necrosis.  Continuation of Gleevec  Gleevec placed on hold 06/02/2015 in anticipation of surgery  06/04/2015 status post low anterior resection and diverting loop ileostomy, resection of tumor  Pathology: 9.5 cm tumor;  GISTsubtype spindle; mitotic rate 1/50 high-power field; positive margin of resection  Adjuvant Gleevec 08/19/2015 (three-year in total course planned)  Ileostomy takedown 01/28/2016  Held 01/28/2016 through 02/14/2016, resumed 02/15/2016  Gleevec stop 04/11/2016-04/18/2016, and 04/26/2016-05/08/2016  Gleevec resumed at a dose of 200 mg daily on 05/09/2016  Gleevec dose increased to 300 mg daily beginning 06/20/2016  Gleevec dose decreased to 200 mg daily beginning 06/23/2016 (poor tolerance at 300 mg dose)  CTs 11/22/2017-enhancing soft tissue mass in the presacral space measuring 5.4 x 3.5 cm. Enlarged pelvic lymph nodes.  Gleevec continued at a dose of 200 mg daily  Restaging CTs 03/11/2018- interval decrease in size of the presacral soft tissue mass.  Stable small pelvic lymph nodes.  Gleevec discontinued 03/13/2018  CT 11/14/2018- new 2.4 cm left pelvic nodule at the piriformis muscle, stable presacral lymph nodes, right abdominal wall hernia, ventral hernia  Gleevec 200 mg daily 12/04/2018 2. Remote history of pulmonary embolism-maintained on apixaban 3. Multiple colon polyps noted on the colonoscopy 07/21/2014 with the pathology revealing tubular adenomas 4. Hospitalization 06/21/2015 through 07/06/2015 with nausea/vomiting, high ileostomy output; found to have delayed gastric emptying. 5. Water soluble contrast enema 08/13/2015 positive for a small to moderate volume of water soluble contrast leakage from the low anterior resection and anastomosis site in the pelvis 6. Ileostomy takedown and resection of cystic abdominal wall mass ( benign pathology) on 01/28/2016 7. Anemia with ferritin in the low normal range 05/23/2016 and  06/20/2016. Ferrous sulfate increased to twice daily 06/26/2016.Improved.    Disposition: Brianna Walker appears stable.  She is tolerating the [place] well.  She will continue Gleevec at the current dose.  The plan is to schedule a restaging pelvic CT  after 4 months of Gleevec.  She will return for an office and lab visit in 1 month. Brianna Walker received an influenza vaccine today.  Betsy Coder, MD  01/30/2019  11:59 AM

## 2019-01-31 ENCOUNTER — Other Ambulatory Visit: Payer: Self-pay | Admitting: Internal Medicine

## 2019-02-03 ENCOUNTER — Telehealth: Payer: Self-pay | Admitting: Oncology

## 2019-02-03 NOTE — Telephone Encounter (Signed)
Scheduled per los. Called and left msg. Mailed printout  °

## 2019-02-25 ENCOUNTER — Inpatient Hospital Stay (HOSPITAL_BASED_OUTPATIENT_CLINIC_OR_DEPARTMENT_OTHER): Payer: Medicare HMO | Admitting: Physician Assistant

## 2019-02-25 ENCOUNTER — Telehealth: Payer: Self-pay | Admitting: Physician Assistant

## 2019-02-25 ENCOUNTER — Other Ambulatory Visit: Payer: Self-pay

## 2019-02-25 ENCOUNTER — Inpatient Hospital Stay (HOSPITAL_BASED_OUTPATIENT_CLINIC_OR_DEPARTMENT_OTHER): Payer: Medicare HMO | Admitting: Oncology

## 2019-02-25 VITALS — BP 130/70 | HR 99 | Temp 98.3°F | Resp 17 | Ht 63.5 in | Wt 200.2 lb

## 2019-02-25 DIAGNOSIS — Z7982 Long term (current) use of aspirin: Secondary | ICD-10-CM | POA: Diagnosis not present

## 2019-02-25 DIAGNOSIS — R197 Diarrhea, unspecified: Secondary | ICD-10-CM | POA: Diagnosis not present

## 2019-02-25 DIAGNOSIS — C49A4 Gastrointestinal stromal tumor of large intestine: Secondary | ICD-10-CM

## 2019-02-25 DIAGNOSIS — Z791 Long term (current) use of non-steroidal anti-inflammatories (NSAID): Secondary | ICD-10-CM | POA: Diagnosis not present

## 2019-02-25 DIAGNOSIS — Z7901 Long term (current) use of anticoagulants: Secondary | ICD-10-CM | POA: Diagnosis not present

## 2019-02-25 DIAGNOSIS — Z79899 Other long term (current) drug therapy: Secondary | ICD-10-CM | POA: Diagnosis not present

## 2019-02-25 DIAGNOSIS — I10 Essential (primary) hypertension: Secondary | ICD-10-CM | POA: Diagnosis not present

## 2019-02-25 DIAGNOSIS — K219 Gastro-esophageal reflux disease without esophagitis: Secondary | ICD-10-CM | POA: Diagnosis not present

## 2019-02-25 DIAGNOSIS — E785 Hyperlipidemia, unspecified: Secondary | ICD-10-CM | POA: Diagnosis not present

## 2019-02-25 LAB — CMP (CANCER CENTER ONLY)
ALT: 9 U/L (ref 0–44)
AST: 13 U/L — ABNORMAL LOW (ref 15–41)
Albumin: 3.5 g/dL (ref 3.5–5.0)
Alkaline Phosphatase: 135 U/L — ABNORMAL HIGH (ref 38–126)
Anion gap: 12 (ref 5–15)
BUN: 12 mg/dL (ref 8–23)
CO2: 25 mmol/L (ref 22–32)
Calcium: 9 mg/dL (ref 8.9–10.3)
Chloride: 108 mmol/L (ref 98–111)
Creatinine: 1.56 mg/dL — ABNORMAL HIGH (ref 0.44–1.00)
GFR, Est AFR Am: 38 mL/min — ABNORMAL LOW (ref >60)
GFR, Estimated: 33 mL/min — ABNORMAL LOW (ref >60)
Glucose, Bld: 101 mg/dL — ABNORMAL HIGH (ref 70–99)
Potassium: 3.6 mmol/L (ref 3.5–5.1)
Sodium: 145 mmol/L (ref 135–145)
Total Bilirubin: 0.8 mg/dL (ref 0.3–1.2)
Total Protein: 8 g/dL (ref 6.5–8.1)

## 2019-02-25 LAB — CBC WITH DIFFERENTIAL (CANCER CENTER ONLY)
Abs Immature Granulocytes: 0.02 10*3/uL (ref 0.00–0.07)
Basophils Absolute: 0 10*3/uL (ref 0.0–0.1)
Basophils Relative: 0 %
Eosinophils Absolute: 0.1 10*3/uL (ref 0.0–0.5)
Eosinophils Relative: 1 %
HCT: 39.5 % (ref 36.0–46.0)
Hemoglobin: 12.7 g/dL (ref 12.0–15.0)
Immature Granulocytes: 0 %
Lymphocytes Relative: 17 %
Lymphs Abs: 1.5 10*3/uL (ref 0.7–4.0)
MCH: 25.6 pg — ABNORMAL LOW (ref 26.0–34.0)
MCHC: 32.2 g/dL (ref 30.0–36.0)
MCV: 79.6 fL — ABNORMAL LOW (ref 80.0–100.0)
Monocytes Absolute: 0.5 10*3/uL (ref 0.1–1.0)
Monocytes Relative: 5 %
Neutro Abs: 6.9 10*3/uL (ref 1.7–7.7)
Neutrophils Relative %: 77 %
Platelet Count: 358 10*3/uL (ref 150–400)
RBC: 4.96 MIL/uL (ref 3.87–5.11)
RDW: 19.9 % — ABNORMAL HIGH (ref 11.5–15.5)
WBC Count: 9.1 10*3/uL (ref 4.0–10.5)
nRBC: 0 % (ref 0.0–0.2)

## 2019-02-25 LAB — MAGNESIUM: Magnesium: 1.8 mg/dL (ref 1.7–2.4)

## 2019-02-25 NOTE — Telephone Encounter (Signed)
Scheduled appt per 10/27 los.  Was not able to get in contact with the patient.  Sent a secure chat and a calendar with central radiology number will be mailed out.

## 2019-02-25 NOTE — Progress Notes (Signed)
[place] OFFICE PROGRESS NOTE  Biagio Borg, MD [address] [place] [address]  DIAGNOSIS: Gastrointestinal Stromal Tumor  CURRENT THERAPY: Gleevec 200 mg p.o. daily. Restarted on 12/04/2018.  INTERVAL HISTORY: Brianna Walker 73 y.o. female returns to the patient for a follow up visit. The patient is feeling well without any concerning complaints today. She continues to tolerate her 200 mg dose of Gleevec well without any adverse side effects. She denies any recent fever, chills, night sweats, or weight loss.  She denies any nausea, vomiting, constipation, or abdominal pain.  She reports that every once in a while she gets diarrhea but she attributes this being diet related as she states it occurs when she eats a "rich diet".  She denies any melena or hematochezia.  She denies any swelling or edema.  She denies any rashes or skin changes.  She is here today for evaluation and repeat blood work.  MEDICAL HISTORY: Past Medical History:  Diagnosis Date  . AKI (acute kidney injury) ([place]) 06/21/2015  . ALLERGIC RHINITIS 09/12/2007  . Arthritis   . BACK PAIN 06/19/2008  . Cancer ([place])    dx. 4'16 -oral chemotherapy only.  . Colon polyps   . Cramp of limb 08/11/2009  . DEEP VENOUS THROMBOPHLEBITIS, LEG, RIGHT 03/04/2010   right leg  . Diverticulosis   . FREQUENCY, URINARY 10/07/2009  . GERD 12/05/2006  . HEMORRHOIDS 11/17/2006  . HIP PAIN, RIGHT, CHRONIC 08/11/2009  . HX, PERSONAL, TUBERCULOSIS 11/17/2006  . HYPERLIPIDEMIA 09/12/2007  . Hyperlipidemia 09/12/2007   Qualifier: Diagnosis of  By: Jenny Reichmann MD, Hunt Oris   . HYPERTENSION 11/17/2006  . INSOMNIA-SLEEP DISORDER-UNSPEC 06/19/2008  . Iron deficiency anemia 12/07/2011  . LEG PAIN, RIGHT 06/19/2008  . Long term (current) use of anticoagulants 11/29/2010  . MASS, SUPERFICIAL 09/12/2007  . MENOPAUSAL DISORDER 09/20/2009  . OVERACTIVE BLADDER 03/04/2010  . Perforation of colon ([place]) 11/17/2015  . Pneumonia   . PULMONARY  EMBOLISM, HX OF 11/17/2006   [place] [facility] s/p Pneumonia  . RECTAL BLEEDING 10/07/2007  . Rectal mass 06/2014  . SCIATICA, RIGHT 09/12/2007  . Shortness of breath dyspnea    With exertion since chemotherapy tx-oral meds  . SKIN LESION 07/20/2009  . TMJ SYNDROME 11/17/2006  . Tuberculosis    pt. was tx.    ALLERGIES:  has No Known Allergies.  MEDICATIONS:  Current Outpatient Medications  Medication Sig Dispense Refill  . acetaminophen (TYLENOL) 500 MG tablet Take 1,000 mg by mouth every 6 (six) hours as needed for moderate pain or headache.    Marland Kitchen aspirin 81 MG tablet Take 81 mg by mouth at bedtime.     . benazepril (LOTENSIN) 40 MG tablet TAKE 1 TABLET EVERY EVENING (NEED MD APPOINTMENT) 90 tablet 0  . ELIQUIS 5 MG TABS tablet TAKE 1 TABLET TWICE DAILY (NEED MD APPOINTMENT) 180 tablet 3  . estradiol (VIVELLE-DOT) 0.025 MG/24HR APPLY 1 PATCH ONTO THE SKIN ONE TIME WEEKLY 8 patch 0  . ferrous sulfate 325 (65 FE) MG tablet Take 1 tablet (325 mg total) by mouth 2 (two) times daily with a meal. 180 tablet 3  . imatinib (GLEEVEC) 100 MG tablet TAKE 2 TABLETS (200 MG TOTAL) BY MOUTH DAILY. TAKE WITH MEALS AND LARGE GLASS OF WATER.CAUTION:CHEMOTHERAPY 60 tablet 1  . loperamide (IMODIUM) 2 MG capsule Take 1-2 capsules (2-4 mg total) by mouth every 8 (eight) hours as needed for diarrhea or loose stools (Use if >2  BM every 8 hours). 30 capsule 0  . LORazepam (ATIVAN) 0.5 MG tablet Take 1 tablet (0.5 mg total) by mouth at bedtime as needed for anxiety. 30 tablet 0  . meloxicam (MOBIC) 15 MG tablet Take 1 tablet (15 mg total) by mouth daily as needed for pain. 30 tablet 5  . nystatin (MYCOSTATIN/NYSTOP) powder Apply topically 3 (three) times daily as needed. 15 g 0  . ofloxacin (OCUFLOX) 0.3 % ophthalmic solution Place 1 drop into both eyes 3 (three) times daily. Taper down    . omeprazole (PRILOSEC) 20 MG capsule TAKE 1 CAPSULE EVERY DAY (NEED MD APPOINTMENT) 90 capsule 3  . potassium  chloride SA (K-DUR,KLOR-CON) 20 MEQ tablet Take 1 tablet twice daily for 3 days, then take 1 tablet by mouth daily. 30 tablet 1  . pravastatin (PRAVACHOL) 20 MG tablet TAKE 1 TABLET AT BEDTIME (NEED MD APPOINTMENT) 90 tablet 1  . prednisoLONE acetate (PRED FORTE) 1 % ophthalmic suspension Place 1 drop into both eyes 3 (three) times daily.     Marland Kitchen Propylhexedrine (BENZEDREX) INHA Place 1 each into the nose daily as needed (congestion).    . traMADol (ULTRAM) 50 MG tablet Take 1-2 tablets (50-100 mg total) by mouth every 6 (six) hours as needed for moderate pain. 40 tablet 1  . Vitamin D, Ergocalciferol, (DRISDOL) 50000 units CAPS capsule Take 1 capsule (50,000 Units total) by mouth every 7 (seven) days. 12 capsule 0   No current facility-administered medications for this visit.     SURGICAL HISTORY:  Past Surgical History:  Procedure Laterality Date  . ABDOMINAL HYSTERECTOMY    . CHOLECYSTECTOMY OPEN    . EUS N/A 08/06/2014   Procedure: LOWER ENDOSCOPIC ULTRASOUND (EUS);  Surgeon: Milus Banister, MD;  Location: [facility];  Service: Endoscopy;  Laterality: N/A;  . EXCISION MASS ABDOMINAL N/A 01/28/2016   Procedure: EXCISION  ABDOMINAL WALL MASS, CYST, QUESTION FAT NECROSIS, QUESTION SEROMA;  Surgeon: Michael Boston, MD;  Location: [facility];  Service: General;  Laterality: N/A;  . hemorroid surgery  removed at dr Silvio Pate office   x 2  . ILEOSTOMY CLOSURE N/A 01/28/2016   Procedure: TAKEDOWN OF LOOP ILEOSTOMY;  Surgeon: Michael Boston, MD;  Location: [facility];  Service: General;  Laterality: N/A;  . [place]  2002     multiple ventral hernia repair/mesh  . INCISIONAL HERNIA REPAIR  01/13/2005   open w onlay Proceed mesh.  Marland Kitchen LAPAROSCOPIC LYSIS OF ADHESIONS  06/04/2015   Procedure: LAPAROSCOPIC LYSIS OF ADHESIONS;  Surgeon: Michael Boston, MD;  Location: [facility];  Service: General;;  . PROCTOSCOPY N/A 01/28/2016   Procedure: RIGID PROCTOSCOPY, DILATION OF COLORECTAL ANASTOMOTIC STRICTURE;   Surgeon: Michael Boston, MD;  Location: [facility];  Service: General;  Laterality: N/A;  . s/p right hip replaced min invasive hip surgury [facility] oct 2011 Right 2011  . tumor removed off of right shoulder Right   . XI ROBOTIC ASSISTED LOWER ANTERIOR RESECTION N/A 06/04/2015   Procedure: XI ROBOTIC ASSISTED LYSIS OF ADHESIONS, LOWER ANTERIOR RESECTION TRANS ABDOMINAL AND TRANS ANAL, COLOANAL HANDSEWN ANASTAOSIS, DIVERTING LOPE ILIOSTOMY, RESECTION GIST TUMOR;  Surgeon: Michael Boston, MD;  Location: [facility];  Service: General;  Laterality: N/A;    REVIEW OF SYSTEMS:   Review of Systems  Constitutional: Negative for appetite change, chills, fatigue, fever and unexpected weight change.  HENT: Negative for mouth sores, nosebleeds, sore throat and trouble swallowing.   Eyes: Negative for eye problems and icterus.  Respiratory:  Negative for cough, hemoptysis, shortness of breath and wheezing.   Cardiovascular: Negative for chest pain and leg swelling.  Gastrointestinal: Negative for abdominal pain, constipation, diarrhea, nausea and vomiting.  Genitourinary: Negative for bladder incontinence, difficulty urinating, dysuria, frequency and hematuria.   Musculoskeletal: Negative for back pain, gait problem, neck pain and neck stiffness.  Skin: Negative for itching and rash.  Neurological: Negative for dizziness, extremity weakness, gait problem, headaches, light-headedness and seizures.  Hematological: Negative for adenopathy. Does not bruise/bleed easily.  Psychiatric/Behavioral: Negative for confusion, depression and sleep disturbance. The patient is not nervous/anxious.     PHYSICAL EXAMINATION:  Blood pressure 130/70, pulse 99, temperature 98.3 F (36.8 C), temperature source Temporal, resp. rate 17, height 5' 3.5" (1.613 m), weight 200 lb 3.2 oz (90.8 kg), SpO2 99 %.  ECOG PERFORMANCE STATUS: 0 - Asymptomatic  Physical Exam  Constitutional: Oriented to person, place, and time and well-developed,  well-nourished, and in no distress.  HENT:  Head: Normocephalic and atraumatic.  Mouth/Throat: Oropharynx is clear and moist. No oropharyngeal exudate.  Eyes: Conjunctivae are normal. Right eye exhibits no discharge. Left eye exhibits no discharge. No scleral icterus. No periorbital edema Neck: Normal range of motion. Neck supple.  Cardiovascular: Normal rate, regular rhythm, normal heart sounds and intact distal pulses.   Pulmonary/Chest: Effort normal and breath sounds normal. No respiratory distress. No wheezes. No rales.  Abdominal: large right abdominal wall hernia. Soft. Bowel sounds are normal. Exhibits no distension and no mass. There is no tenderness.  Musculoskeletal: Normal range of motion. Exhibits no edema.  Lymphadenopathy:    No cervical adenopathy.  Neurological: Alert and oriented to person, place, and time. Exhibits normal muscle tone. Gait normal. Coordination normal.  Skin: Skin is warm and dry. No rash noted. Not diaphoretic. No erythema. No pallor.  Psychiatric: Mood, memory and judgment normal.  Vitals reviewed.  LABORATORY DATA: Lab Results  Component Value Date   WBC 9.1 02/25/2019   HGB 12.7 02/25/2019   HCT 39.5 02/25/2019   MCV 79.6 (L) 02/25/2019   PLT 358 02/25/2019      Chemistry      Component Value Date/Time   NA 145 02/25/2019 1407   NA 143 05/03/2017 1023   K 3.6 02/25/2019 1407   K 4.0 05/03/2017 1023   CL 108 02/25/2019 1407   CO2 25 02/25/2019 1407   CO2 26 05/03/2017 1023   BUN 12 02/25/2019 1407   BUN 11.7 05/03/2017 1023   CREATININE 1.56 (H) 02/25/2019 1407   CREATININE 1.1 05/03/2017 1023      Component Value Date/Time   CALCIUM 9.0 02/25/2019 1407   CALCIUM 9.1 05/03/2017 1023   ALKPHOS 135 (H) 02/25/2019 1407   ALKPHOS 113 05/03/2017 1023   AST 13 (L) 02/25/2019 1407   AST 12 05/03/2017 1023   ALT 9 02/25/2019 1407   ALT 10 05/03/2017 1023   BILITOT 0.8 02/25/2019 1407   BILITOT 0.96 05/03/2017 1023        RADIOGRAPHIC STUDIES:  No results found.   ASSESSMENT/PLAN:  1. Gastrointestinal stromal tumor of the rectum  Status post an endoscopic ultrasound on 08/06/2014 confirming a mass abutting the distal rectal wall with an FNA biopsy confirming a gastrointestinal stromal tumor  Initiation of Gleevec 09/04/2014  MRI pelvis 12/15/2014 with interval decreased size of the large anorectal mass with central necrosis.  Continuation of Gleevec  Gleevec placed on hold 06/02/2015 in anticipation of surgery  06/04/2015 status post low anterior resection and diverting loop  ileostomy, resection of tumor  Pathology: 9.5 cm tumor; GISTsubtype spindle; mitotic rate 1/50 high-power field; positive margin of resection  Adjuvant Gleevec 08/19/2015 (three-year in total course planned)  Ileostomy takedown 01/28/2016  Held 01/28/2016 through 02/14/2016, resumed 02/15/2016  Gleevec stop 04/11/2016-04/18/2016, and 04/26/2016-05/08/2016  Gleevec resumed at a dose of 200 mg daily on 05/09/2016  Gleevec dose increased to 300 mg daily beginning 06/20/2016  Gleevec dose decreased to 200 mg daily beginning 06/23/2016 (poor tolerance at 300 mg dose)  CTs 11/22/2017-enhancing soft tissue mass in the presacral space measuring 5.4 x 3.5 cm. Enlarged pelvic lymph nodes.  Gleevec continued at a dose of 200 mg daily  Restaging CTs 03/11/2018-interval decrease in size of the presacral soft tissue mass. Stable small pelvic lymph nodes.  Gleevec discontinued 03/13/2018  CT 11/14/2018-new 2.4 cm left pelvic nodule at the piriformis muscle, stable presacral lymph nodes, right abdominal wall hernia, ventral hernia  Gleevec 200 mg daily 12/04/2018 2. Remote history of pulmonary embolism-maintained on apixaban 3. Multiple colon polyps noted on the colonoscopy 07/21/2014 with the pathology revealing tubular adenomas 4. Hospitalization 06/21/2015 through 07/06/2015 with nausea/vomiting, high ileostomy output;  found to have delayed gastric emptying. 5. Water soluble contrast enema 08/13/2015 positive for a small to moderate volume of water soluble contrast leakage from the low anterior resection and anastomosis site in the pelvis 6. Ileostomy takedown and resection of cystic abdominal wall mass ( benign pathology) on 01/28/2016 7. Anemia with ferritin in the low normal range 05/23/2016 and 06/20/2016. Ferrous sulfate increased to twice daily 06/26/2016.Improved.    Disposition: Ms. Loveland appears stable and is feeling well today.  She is tolerating the [place] well.  She will continue Gleevec at the current dose. I will arrange for a restaging CT scan of the abdomen and pelvis prior to her next appointment. The patient's serum creatinine is slightly elevated today compared to her baseline. The patient was encouraged to increase her fluid intake and to stay hydrated. I will arrange for the patient to have labs performed on the day of her restaging CT scan to determine if her scan will need to be performed without IV contrast depending on her kidney function. The patient will drink the watered down contrast due to intolerance with the normal contrast.  She will return for an office visit in about 5 weeks around 04/02/2019 for evaluation and to review the results of her CT scan.    Orders Placed This Encounter  Procedures  . CT Abdomen Pelvis W Contrast    Standing Status:   Future    Standing Expiration Date:   02/25/2020    Order Specific Question:   ** REASON FOR EXAM (FREE TEXT)    Answer:   Restaging GIST tumor    Order Specific Question:   If indicated for the ordered procedure, I authorize the administration of contrast media per Radiology protocol    Answer:   Yes    Order Specific Question:   Preferred imaging location?    Answer:   [facility]    Order Specific Question:   Is Oral Contrast requested for this exam?    Answer:   Yes, Per Radiology protocol    Order Specific  Question:   Radiology Contrast Protocol - do NOT remove file path    Answer:   [email or web address]  . Magnesium    Standing Status:   Future    Standing Expiration Date:   02/25/2020  . CMP ([facility] only)  Standing Status:   Future    Standing Expiration Date:   02/25/2020  . CBC with Differential (Cancer Center Only)    Standing Status:   Future    Standing Expiration Date:   02/25/2020     Saharsh Sterling L Joycelyn Liska, PA-C 02/25/19

## 2019-02-26 ENCOUNTER — Telehealth: Payer: Self-pay | Admitting: *Deleted

## 2019-02-26 MED FILL — IMATINIB MESYLATE 100 MG TA: 100 | 30 days supply | Qty: 60 | Fill #1

## 2019-02-26 NOTE — Telephone Encounter (Signed)
LM to call Dr Gearldine Shown nurse

## 2019-02-26 NOTE — Telephone Encounter (Signed)
Notified of message below

## 2019-02-26 NOTE — Progress Notes (Signed)
L/M to call.

## 2019-02-26 NOTE — Telephone Encounter (Signed)
-----   Message from Ladell Pier, MD sent at 02/25/2019  5:06 PM EDT ----- Please call patient, creatinine is mildly elevated, needs repeat bmet next 2 weeks, prior to CT

## 2019-02-27 ENCOUNTER — Other Ambulatory Visit: Payer: Medicare HMO

## 2019-02-27 ENCOUNTER — Ambulatory Visit: Payer: Medicare HMO | Admitting: Nurse Practitioner

## 2019-03-19 ENCOUNTER — Other Ambulatory Visit: Payer: Self-pay | Admitting: Oncology

## 2019-03-21 ENCOUNTER — Other Ambulatory Visit: Payer: Self-pay | Admitting: Oncology

## 2019-03-25 ENCOUNTER — Other Ambulatory Visit: Payer: Self-pay | Admitting: Internal Medicine

## 2019-03-25 MED FILL — IMATINIB MESYLATE 100 MG TA: 100 | 30 days supply | Qty: 60 | Fill #0

## 2019-03-31 ENCOUNTER — Inpatient Hospital Stay: Payer: Medicare HMO | Attending: Oncology

## 2019-03-31 ENCOUNTER — Other Ambulatory Visit: Payer: Self-pay

## 2019-03-31 ENCOUNTER — Ambulatory Visit (HOSPITAL_COMMUNITY)
Admission: RE | Admit: 2019-03-31 | Discharge: 2019-03-31 | Disposition: A | Discharge status: Home / Self Care | Payer: Medicare HMO | Source: Ambulatory Visit | Attending: Physician Assistant | Admitting: Physician Assistant

## 2019-03-31 DIAGNOSIS — C49A4 Gastrointestinal stromal tumor of large intestine: Secondary | ICD-10-CM | POA: Insufficient documentation

## 2019-03-31 DIAGNOSIS — C49A0 Gastrointestinal stromal tumor, unspecified site: Secondary | ICD-10-CM | POA: Diagnosis not present

## 2019-03-31 LAB — CMP (CANCER CENTER ONLY)
ALT: 9 U/L (ref 0–44)
AST: 13 U/L — ABNORMAL LOW (ref 15–41)
Albumin: 3.4 g/dL — ABNORMAL LOW (ref 3.5–5.0)
Alkaline Phosphatase: 126 U/L (ref 38–126)
Anion gap: 11 (ref 5–15)
BUN: 13 mg/dL (ref 8–23)
CO2: 25 mmol/L (ref 22–32)
Calcium: 8.9 mg/dL (ref 8.9–10.3)
Chloride: 108 mmol/L (ref 98–111)
Creatinine: 1.26 mg/dL — ABNORMAL HIGH (ref 0.44–1.00)
GFR, Est AFR Am: 50 mL/min — ABNORMAL LOW (ref >60)
GFR, Estimated: 43 mL/min — ABNORMAL LOW (ref >60)
Glucose, Bld: 108 mg/dL — ABNORMAL HIGH (ref 70–99)
Potassium: 3.9 mmol/L (ref 3.5–5.1)
Sodium: 144 mmol/L (ref 135–145)
Total Bilirubin: 0.7 mg/dL (ref 0.3–1.2)
Total Protein: 7.9 g/dL (ref 6.5–8.1)

## 2019-03-31 LAB — CBC WITH DIFFERENTIAL (CANCER CENTER ONLY)
Abs Immature Granulocytes: 0.01 10*3/uL (ref 0.00–0.07)
Basophils Absolute: 0 10*3/uL (ref 0.0–0.1)
Basophils Relative: 0 %
Eosinophils Absolute: 0.1 10*3/uL (ref 0.0–0.5)
Eosinophils Relative: 2 %
HCT: 38.4 % (ref 36.0–46.0)
Hemoglobin: 12.3 g/dL (ref 12.0–15.0)
Immature Granulocytes: 0 %
Lymphocytes Relative: 19 %
Lymphs Abs: 1.4 10*3/uL (ref 0.7–4.0)
MCH: 26.6 pg (ref 26.0–34.0)
MCHC: 32 g/dL (ref 30.0–36.0)
MCV: 82.9 fL (ref 80.0–100.0)
Monocytes Absolute: 0.4 10*3/uL (ref 0.1–1.0)
Monocytes Relative: 5 %
Neutro Abs: 5.1 10*3/uL (ref 1.7–7.7)
Neutrophils Relative %: 74 %
Platelet Count: 334 10*3/uL (ref 150–400)
RBC: 4.63 MIL/uL (ref 3.87–5.11)
RDW: 17.7 % — ABNORMAL HIGH (ref 11.5–15.5)
WBC Count: 7 10*3/uL (ref 4.0–10.5)
nRBC: 0 % (ref 0.0–0.2)

## 2019-03-31 LAB — MAGNESIUM: Magnesium: 1.7 mg/dL (ref 1.7–2.4)

## 2019-03-31 MED ORDER — SODIUM CHLORIDE (PF) 0.9 % IJ SOLN
INTRAMUSCULAR | Status: AC
  Filled 2019-03-31: qty 50

## 2019-03-31 MED ORDER — IOHEXOL 300 MG/ML  SOLN
100.0000 mL | Freq: Once | INTRAMUSCULAR | Status: AC | PRN
  Administered 2019-03-31: 100 mL via INTRAVENOUS

## 2019-04-03 ENCOUNTER — Telehealth: Payer: Self-pay | Admitting: Oncology

## 2019-04-03 ENCOUNTER — Inpatient Hospital Stay: Payer: Medicare HMO | Attending: Oncology | Admitting: Nurse Practitioner

## 2019-04-03 ENCOUNTER — Encounter: Payer: Self-pay | Admitting: Nurse Practitioner

## 2019-04-03 ENCOUNTER — Other Ambulatory Visit: Payer: Self-pay

## 2019-04-03 VITALS — BP 134/70 | HR 92 | Temp 98.3°F | Resp 17 | Ht 63.5 in | Wt 201.4 lb

## 2019-04-03 DIAGNOSIS — C49A4 Gastrointestinal stromal tumor of large intestine: Secondary | ICD-10-CM | POA: Insufficient documentation

## 2019-04-03 DIAGNOSIS — R0609 Other forms of dyspnea: Secondary | ICD-10-CM | POA: Insufficient documentation

## 2019-04-03 NOTE — Telephone Encounter (Signed)
Gave avs and calendar ° °

## 2019-04-03 NOTE — Progress Notes (Addendum)
[facility] OFFICE PROGRESS NOTE   Diagnosis: Gastrointestinal stromal tumor  INTERVAL HISTORY:   Ms. Havice returns as scheduled.  She continues Gleevec.  She denies nausea/vomiting.  No diarrhea.  No rash.  She has occasional mild dyspnea on exertion.  No cough.  No fever.  Objective:  Vital signs in last 24 hours:  Blood pressure 134/70, pulse 92, temperature 98.3 F (36.8 C), temperature source Temporal, resp. rate 17, height 5' 3.5" (1.613 m), weight 201 lb 6.4 oz (91.4 kg), SpO2 99 %.    HEENT: No thrush or ulcers. GI: Abdomen soft and nontender.  No hepatomegaly.  Soft rounded fullness right lower abdomen. Vascular: No leg edema. Neuro: Alert and oriented. Skin: No rash.   Lab Results:  Lab Results  Component Value Date   WBC 7.0 03/31/2019   HGB 12.3 03/31/2019   HCT 38.4 03/31/2019   MCV 82.9 03/31/2019   PLT 334 03/31/2019   NEUTROABS 5.1 03/31/2019    Imaging:  No results found.  Medications: I have reviewed the patient's current medications.  Assessment/Plan: 1. Gastrointestinal stromal tumor of the rectum  Status post an endoscopic ultrasound on 08/06/2014 confirming a mass abutting the distal rectal wall with an FNA biopsy confirming a gastrointestinal stromal tumor  Initiation of Gleevec 09/04/2014  MRI pelvis 12/15/2014 with interval decreased size of the large anorectal mass with central necrosis.  Continuation of Gleevec  Gleevec placed on hold 06/02/2015 in anticipation of surgery  06/04/2015 status post low anterior resection and diverting loop ileostomy, resection of tumor  Pathology: 9.5 cm tumor; GISTsubtype spindle; mitotic rate 1/50 high-power field; positive margin of resection  Adjuvant Gleevec 08/19/2015 (three-year in total course planned)  Ileostomy takedown 01/28/2016  Held 01/28/2016 through 02/14/2016, resumed 02/15/2016  Gleevec stop 04/11/2016-04/18/2016, and 04/26/2016-05/08/2016  Gleevec resumed  at a dose of 200 mg daily on 05/09/2016  Gleevec dose increased to 300 mg daily beginning 06/20/2016  Gleevec dose decreased to 200 mg daily beginning 06/23/2016 (poor tolerance at 300 mg dose)  CTs 11/22/2017-enhancing soft tissue mass in the presacral space measuring 5.4 x 3.5 cm. Enlarged pelvic lymph nodes.  Gleevec continued at a dose of 200 mg daily  Restaging CTs 03/11/2018-interval decrease in size of the presacral soft tissue mass. Stable small pelvic lymph nodes.  Gleevec discontinued 03/13/2018  CT 11/14/2018-new 2.4 cm left pelvic nodule at the piriformis muscle, stable presacral lymph nodes, right abdominal wall hernia, ventral hernia  Gleevec 200 mg daily 12/04/2018  CTs 04/01/2019-decrease in size of soft tissue nodule deep left pelvis.  5 nodules in the sigmoid mesocolon stable to slightly larger.  Stable presacral soft tissue thickening.  No findings for new metastatic disease. 2. Remote history of pulmonary embolism-maintained on apixaban 3. Multiple colon polyps noted on the colonoscopy 07/21/2014 with the pathology revealing tubular adenomas 4. Hospitalization 06/21/2015 through 07/06/2015 with nausea/vomiting, high ileostomy output; found to have delayed gastric emptying. 5. Water soluble contrast enema 08/13/2015 positive for a small to moderate volume of water soluble contrast leakage from the low anterior resection and anastomosis site in the pelvis 6. Ileostomy takedown and resection of cystic abdominal wall mass ( benign pathology) on 01/28/2016 7. Anemia with ferritin in the low normal range 05/23/2016 and 06/20/2016. Ferrous sulfate increased to twice daily 06/26/2016.Improved.    Disposition: Ms. Curby appears stable.  Recent restaging CTs show overall stable disease.  She will continue Gleevec at the current dose of 200 mg daily.  We reviewed the labs from  03/31/2019.  She will return for lab and follow-up in 6 weeks.  Patient seen with Dr.  Benay Spice.  CT images reviewed on the computer with Ms. Paulos.   Ned Card ANP/GNP-BC   04/03/2019  12:53 PM This was a shared visit with Ned Card.  Ms. Bawden is tolerating the [place] well.  The dominant pelvic mass is smaller.  We reviewed the CT images once.  She will continue Gleevec at the current dose.  She will return for an office visit in 6 weeks.  We will plan for a repeat CT evaluation in 4 -6 months.  Julieanne Manson, MD

## 2019-04-23 MED FILL — IMATINIB MESYLATE 100 MG TA: 100 | 30 days supply | Qty: 60 | Fill #1

## 2019-05-07 ENCOUNTER — Other Ambulatory Visit: Payer: Self-pay | Admitting: Internal Medicine

## 2019-05-07 NOTE — Telephone Encounter (Signed)
Please refill as per office routine med refill policy (all routine meds refilled for 3 mo or monthly per pt preference up to one year from last visit, then month to month grace period for 3 mo, then further med refills will have to be denied)  

## 2019-05-09 ENCOUNTER — Encounter: Payer: Medicare HMO | Admitting: Internal Medicine

## 2019-05-12 ENCOUNTER — Other Ambulatory Visit: Payer: Self-pay

## 2019-05-12 MED ORDER — APIXABAN 5 MG PO TABS: ORAL_TABLET | ORAL | 0 refills | Status: DC

## 2019-05-12 MED ORDER — ESTRADIOL 0.025 MG/24HR TD PTTW: MEDICATED_PATCH | TRANSDERMAL | 0 refills | Status: DC

## 2019-05-14 ENCOUNTER — Other Ambulatory Visit: Payer: Self-pay | Admitting: *Deleted

## 2019-05-14 MED ORDER — OMEPRAZOLE 20 MG PO CPDR: DELAYED_RELEASE_CAPSULE | ORAL | 0 refills | Status: DC

## 2019-05-22 ENCOUNTER — Inpatient Hospital Stay: Payer: Medicare Other | Admitting: Oncology

## 2019-05-22 ENCOUNTER — Telehealth: Payer: Self-pay | Admitting: Emergency Medicine

## 2019-05-22 ENCOUNTER — Inpatient Hospital Stay: Payer: Medicare Other

## 2019-05-22 NOTE — Telephone Encounter (Signed)
Pt called to cancel lab/MD appt with Sherrill's office today d/t GI upset.  Pt aware scheduling dept will call her back to reschedule appt and that appts for today will be cancelled/MD will be made aware, denies any further questions or concerns at this time.  Scheduling message sent, MD Sherrill's office made aware.

## 2019-05-23 ENCOUNTER — Telehealth: Payer: Self-pay | Admitting: Oncology

## 2019-05-23 NOTE — Telephone Encounter (Signed)
Scheduled per 1/21 sch msg. Called and spoke with pt, confirmed 2/4 appt

## 2019-05-26 ENCOUNTER — Other Ambulatory Visit: Payer: Self-pay | Admitting: Oncology

## 2019-05-29 MED FILL — IMATINIB MESYLATE 100 MG TA: 100 | 30 days supply | Qty: 60 | Fill #0

## 2019-06-03 ENCOUNTER — Telehealth: Payer: Self-pay

## 2019-06-03 NOTE — Telephone Encounter (Signed)
Patient calling and states that she is scheduled for her COVID vaccine on Thursday 06/05/2019 and needs approval for this since she is on eloquis. Please advise.

## 2019-06-05 ENCOUNTER — Encounter: Payer: Self-pay | Admitting: Nurse Practitioner

## 2019-06-05 ENCOUNTER — Inpatient Hospital Stay: Payer: Medicare HMO

## 2019-06-05 ENCOUNTER — Other Ambulatory Visit: Payer: Self-pay

## 2019-06-05 ENCOUNTER — Inpatient Hospital Stay: Payer: Medicare HMO | Attending: Oncology | Admitting: Nurse Practitioner

## 2019-06-05 VITALS — BP 143/91 | HR 101 | Resp 18 | Ht 63.5 in | Wt 199.7 lb

## 2019-06-05 DIAGNOSIS — C49A4 Gastrointestinal stromal tumor of large intestine: Secondary | ICD-10-CM

## 2019-06-05 DIAGNOSIS — R21 Rash and other nonspecific skin eruption: Secondary | ICD-10-CM | POA: Diagnosis not present

## 2019-06-05 LAB — CMP (CANCER CENTER ONLY)
ALT: 12 U/L (ref 0–44)
AST: 15 U/L (ref 15–41)
Albumin: 3.4 g/dL — ABNORMAL LOW (ref 3.5–5.0)
Alkaline Phosphatase: 115 U/L (ref 38–126)
Anion gap: 7 (ref 5–15)
BUN: 14 mg/dL (ref 8–23)
CO2: 24 mmol/L (ref 22–32)
Calcium: 8.6 mg/dL — ABNORMAL LOW (ref 8.9–10.3)
Chloride: 108 mmol/L (ref 98–111)
Creatinine: 1.2 mg/dL — ABNORMAL HIGH (ref 0.44–1.00)
GFR, Est AFR Am: 53 mL/min — ABNORMAL LOW (ref >60)
GFR, Estimated: 45 mL/min — ABNORMAL LOW (ref >60)
Glucose, Bld: 99 mg/dL (ref 70–99)
Potassium: 3.6 mmol/L (ref 3.5–5.1)
Sodium: 139 mmol/L (ref 135–145)
Total Bilirubin: 0.8 mg/dL (ref 0.3–1.2)
Total Protein: 7.6 g/dL (ref 6.5–8.1)

## 2019-06-05 LAB — CBC WITH DIFFERENTIAL (CANCER CENTER ONLY)
Abs Immature Granulocytes: 0.03 10*3/uL (ref 0.00–0.07)
Basophils Absolute: 0 10*3/uL (ref 0.0–0.1)
Basophils Relative: 1 %
Eosinophils Absolute: 0.1 10*3/uL (ref 0.0–0.5)
Eosinophils Relative: 2 %
HCT: 39 % (ref 36.0–46.0)
Hemoglobin: 12.7 g/dL (ref 12.0–15.0)
Immature Granulocytes: 0 %
Lymphocytes Relative: 21 %
Lymphs Abs: 1.4 10*3/uL (ref 0.7–4.0)
MCH: 27.8 pg (ref 26.0–34.0)
MCHC: 32.6 g/dL (ref 30.0–36.0)
MCV: 85.3 fL (ref 80.0–100.0)
Monocytes Absolute: 0.4 10*3/uL (ref 0.1–1.0)
Monocytes Relative: 6 %
Neutro Abs: 4.9 10*3/uL (ref 1.7–7.7)
Neutrophils Relative %: 70 %
Platelet Count: 330 10*3/uL (ref 150–400)
RBC: 4.57 MIL/uL (ref 3.87–5.11)
RDW: 14.7 % (ref 11.5–15.5)
WBC Count: 7 10*3/uL (ref 4.0–10.5)
nRBC: 0 % (ref 0.0–0.2)

## 2019-06-05 LAB — MAGNESIUM: Magnesium: 1.7 mg/dL (ref 1.7–2.4)

## 2019-06-05 NOTE — Telephone Encounter (Signed)
I have informed patient of this response.  Patient states she does not need an approval.  She needs a letter only stating that she currently is taking a blood thinner.

## 2019-06-05 NOTE — Telephone Encounter (Signed)
Please see below message.

## 2019-06-05 NOTE — Progress Notes (Signed)
[place] OFFICE PROGRESS NOTE   Diagnosis: Gastrointestinal stromal tumor  INTERVAL HISTORY:   Ms. Crutcher returns as scheduled.  She continues Gleevec.  She denies nausea/vomiting.  No mouth sores. No diarrhea.  She intermittently notes a rash in the left axilla.  No fever, cough, shortness of breath.  No leg swelling.  She reports she is scheduled to receive the Covid vaccine this afternoon.  Objective:  Vital signs in last 24 hours:  Blood pressure (!) 143/91, pulse (!) 101, resp. rate 18, height 5' 3.5" (1.613 m), weight 199 lb 11.2 oz (90.6 kg), SpO2 96 %.    HEENT: No thrush or ulcers. Resp: Lungs clear bilaterally. Cardio: Regular rate and rhythm. GI: Abdomen soft and nontender.  No hepatomegaly.  Soft rounded fullness right lower abdomen. Vascular: No leg edema.  Skin: No rash, specifically no rash left axilla.   Lab Results:  Lab Results  Component Value Date   WBC 7.0 06/05/2019   HGB 12.7 06/05/2019   HCT 39.0 06/05/2019   MCV 85.3 06/05/2019   PLT 330 06/05/2019   NEUTROABS 4.9 06/05/2019    Imaging:  No results found.  Medications: I have reviewed the patient's current medications.  Assessment/Plan: 1.Gastrointestinal stromal tumor of the rectum  Status post an endoscopic ultrasound on 08/06/2014 confirming a mass abutting the distal rectal wall with an FNA biopsy confirming a gastrointestinal stromal tumor  Initiation of Gleevec 09/04/2014  MRI pelvis 12/15/2014 with interval decreased size of the large anorectal mass with central necrosis.  Continuation of Gleevec  Gleevec placed on hold 06/02/2015 in anticipation of surgery  06/04/2015 status post low anterior resection and diverting loop ileostomy, resection of tumor  Pathology: 9.5 cm tumor; GISTsubtype spindle; mitotic rate 1/50 high-power field; positive margin of resection  Adjuvant Gleevec 08/19/2015 (three-year in total course planned)  Ileostomy takedown  01/28/2016  Held 01/28/2016 through 02/14/2016, resumed 02/15/2016  Gleevec stop 04/11/2016-04/18/2016, and 04/26/2016-05/08/2016  Gleevec resumed at a dose of 200 mg daily on 05/09/2016  Gleevec dose increased to 300 mg daily beginning 06/20/2016  Gleevec dose decreased to 200 mg daily beginning 06/23/2016 (poor tolerance at 300 mg dose)  CTs 11/22/2017-enhancing soft tissue mass in the presacral space measuring 5.4 x 3.5 cm. Enlarged pelvic lymph nodes.  Gleevec continued at a dose of 200 mg daily  Restaging CTs 03/11/2018-interval decrease in size of the presacral soft tissue mass. Stable small pelvic lymph nodes.  Gleevec discontinued 03/13/2018  CT 11/14/2018-new 2.4 cm left pelvic nodule at the piriformis muscle, stable presacral lymph nodes, right abdominal wall hernia, ventral hernia  Gleevec 200 mg daily 12/04/2018  CTs 04/01/2019-decrease in size of soft tissue nodule deep left pelvis.  5 nodules in the sigmoid mesocolon stable to slightly larger.  Stable presacral soft tissue thickening.  No findings for new metastatic disease. 2. Remote history of pulmonary embolism-maintained on apixaban 3. Multiple colon polyps noted on the colonoscopy 07/21/2014 with the pathology revealing tubular adenomas 4. Hospitalization 06/21/2015 through 07/06/2015 with nausea/vomiting, high ileostomy output; found to have delayed gastric emptying. 5. Water soluble contrast enema 08/13/2015 positive for a small to moderate volume of water soluble contrast leakage from the low anterior resection and anastomosis site in the pelvis 6. Ileostomy takedown and resection of cystic abdominal wall mass ( benign pathology) on 01/28/2016 7. Anemia with ferritin in the low normal range 05/23/2016 and 06/20/2016. Ferrous sulfate increased to twice daily 06/26/2016.Improved.   Disposition: Brianna Walker appears stable.  There is no  clinical evidence of disease progression.  She will continue Gleevec at the  current dose.  We reviewed the CBC from today.  Counts are adequate to continue with Gleevec as above.  She will return for lab and follow-up in 6 weeks.  She will contact the office in the interim with any problems.    Ned Card ANP/GNP-BC   06/05/2019  11:14 AM

## 2019-06-05 NOTE — Telephone Encounter (Signed)
Thanks will inform patient of this information

## 2019-06-05 NOTE — Telephone Encounter (Signed)
Patient also would like to know if Dr. Jenny Reichmann thought it was ok for her to get the COVID vac while taking a blood thinner.

## 2019-06-05 NOTE — Telephone Encounter (Signed)
Yes, that would be fine.  The only difference is that they usually want to observe for 30 min after the shot instead of 15 min for those without the blood thinner

## 2019-06-05 NOTE — Telephone Encounter (Signed)
Who needs the approval, as a note for this is VERY EXTREMELY VERY VERY unusual.....and no one else seems to need a note for this

## 2019-06-06 ENCOUNTER — Telehealth: Payer: Self-pay | Admitting: Oncology

## 2019-06-06 NOTE — Telephone Encounter (Signed)
Notified pt w/MD response. Pt states she received her 1st dose yesterday. Updated Immunization record.Marland KitchenJohny Chess

## 2019-06-06 NOTE — Telephone Encounter (Signed)
Scheduled per los. Called and spoke with patient. Confirmed appt 

## 2019-06-11 ENCOUNTER — Other Ambulatory Visit: Payer: Self-pay | Admitting: Internal Medicine

## 2019-06-16 ENCOUNTER — Telehealth: Payer: Self-pay

## 2019-06-16 NOTE — Telephone Encounter (Signed)
New message    1. Which medications need to be refilled? (please list name of each medication and dose if known) estradiol (VIVELLE-DOT) 0.025 MG/24HR  2. Which pharmacy/location (including street and city if local pharmacy) is medication to be sent to? [facility] mail order   3. Do they need a 30 day or 90 day supply?

## 2019-06-17 MED ORDER — ESTRADIOL 0.025 MG/24HR TD PTTW: MEDICATED_PATCH | TRANSDERMAL | 0 refills | Status: DC

## 2019-06-17 NOTE — Telephone Encounter (Signed)
Okay to refill estradiol? Please advise.

## 2019-06-17 NOTE — Telephone Encounter (Signed)
Brianna Walker for one month only, as pt is overdue for yearly f/u  Please remind pt to make ROV for further refills

## 2019-06-17 NOTE — Telephone Encounter (Signed)
Pt contacted and scheduled for follow up next week. PCP sent erx for 1 month.

## 2019-06-18 ENCOUNTER — Other Ambulatory Visit: Payer: Self-pay

## 2019-06-18 ENCOUNTER — Ambulatory Visit (INDEPENDENT_AMBULATORY_CARE_PROVIDER_SITE_OTHER): Payer: Medicare HMO | Admitting: Internal Medicine

## 2019-06-18 ENCOUNTER — Encounter: Payer: Self-pay | Admitting: Internal Medicine

## 2019-06-18 VITALS — BP 144/84 | Temp 98.1°F | Ht 65.0 in | Wt 200.6 lb

## 2019-06-18 DIAGNOSIS — Z0000 Encounter for general adult medical examination without abnormal findings: Secondary | ICD-10-CM | POA: Diagnosis not present

## 2019-06-18 DIAGNOSIS — R739 Hyperglycemia, unspecified: Secondary | ICD-10-CM

## 2019-06-18 MED ORDER — ESTRADIOL 0.025 MG/24HR TD PTTW: MEDICATED_PATCH | TRANSDERMAL | 3 refills | Status: DC

## 2019-06-18 MED ORDER — ESTRADIOL 0.025 MG/24HR TD PTTW: MEDICATED_PATCH | TRANSDERMAL | 0 refills | Status: DC

## 2019-06-18 NOTE — Assessment & Plan Note (Signed)
stable overall by history and exam, recent data reviewed with pt, and pt to continue medical treatment as before,  to f/u any worsening symptoms or concerns  

## 2019-06-18 NOTE — Patient Instructions (Signed)
Please continue all other medications as before, and refills have been done if requested.  Please have the pharmacy call with any other refills you may need.  Please continue your efforts at being more active, low cholesterol diet, and weight control.  You are otherwise up to date with prevention measures today.  Please keep your appointments with your specialists as you may have planned  Please make an Appointment to return for your 1 year visit, or sooner if needed 

## 2019-06-18 NOTE — Progress Notes (Signed)
Subjective:    Patient ID: Brianna Walker, female    DOB: [birth year], 73 y.o.   MRN: [id number]  HPI  Here for wellness and f/u;  Overall doing ok;  Pt denies Chest pain, worsening SOB, DOE, wheezing, orthopnea, PND, worsening LE edema, palpitations, dizziness or syncope.  Pt denies neurological change such as new headache, facial or extremity weakness.  Pt denies polydipsia, polyuria, or low sugar symptoms. Pt states overall good compliance with treatment and medications, good tolerability, and has been trying to follow appropriate diet.  Pt denies worsening depressive symptoms, suicidal ideation or panic. No fever, night sweats, wt loss, loss of appetite, or other constitutional symptoms.  Pt states good ability with ADL's, has low fall risk, home safety reviewed and adequate, no other significant changes in hearing or vision, and only occasionally active with exercise.  S/p covid shot #1, for #2 on mar 25.  No new complaints Past Medical History:  Diagnosis Date  . AKI (acute kidney injury) ([place]) 06/21/2015  . ALLERGIC RHINITIS 09/12/2007  . Arthritis   . BACK PAIN 06/19/2008  . Cancer ([place])    dx. 4'16 -oral chemotherapy only.  . Colon polyps   . Cramp of limb 08/11/2009  . DEEP VENOUS THROMBOPHLEBITIS, LEG, RIGHT 03/04/2010   right leg  . Diverticulosis   . FREQUENCY, URINARY 10/07/2009  . GERD 12/05/2006  . HEMORRHOIDS 11/17/2006  . HIP PAIN, RIGHT, CHRONIC 08/11/2009  . HX, PERSONAL, TUBERCULOSIS 11/17/2006  . HYPERLIPIDEMIA 09/12/2007  . Hyperlipidemia 09/12/2007   Qualifier: Diagnosis of  By: Jenny Reichmann MD, [place] Oris   . HYPERTENSION 11/17/2006  . INSOMNIA-SLEEP DISORDER-UNSPEC 06/19/2008  . Iron deficiency anemia 12/07/2011  . LEG PAIN, RIGHT 06/19/2008  . Long term (current) use of anticoagulants 11/29/2010  . MASS, SUPERFICIAL 09/12/2007  . MENOPAUSAL DISORDER 09/20/2009  . OVERACTIVE BLADDER 03/04/2010  . Perforation of colon ([place]) 11/17/2015  . Pneumonia   . PULMONARY EMBOLISM, HX OF  11/17/2006   [facility] s/p Pneumonia  . RECTAL BLEEDING 10/07/2007  . Rectal mass 06/2014  . SCIATICA, RIGHT 09/12/2007  . Shortness of breath dyspnea    With exertion since chemotherapy tx-oral meds  . SKIN LESION 07/20/2009  . TMJ SYNDROME 11/17/2006  . Tuberculosis    pt. was tx.   Past Surgical History:  Procedure Laterality Date  . ABDOMINAL HYSTERECTOMY    . CHOLECYSTECTOMY OPEN    . EUS N/A 08/06/2014   Procedure: LOWER ENDOSCOPIC ULTRASOUND (EUS);  Surgeon: Milus Banister, MD;  Location: [facility];  Service: Endoscopy;  Laterality: N/A;  . EXCISION MASS ABDOMINAL N/A 01/28/2016   Procedure: EXCISION  ABDOMINAL WALL MASS, CYST, QUESTION FAT NECROSIS, QUESTION SEROMA;  Surgeon: Michael Boston, MD;  Location: [facility];  Service: General;  Laterality: N/A;  . hemorroid surgery  removed at dr Silvio Pate office   x 2  . ILEOSTOMY CLOSURE N/A 01/28/2016   Procedure: TAKEDOWN OF LOOP ILEOSTOMY;  Surgeon: Michael Boston, MD;  Location: [facility];  Service: General;  Laterality: N/A;  . [place]  2002     multiple ventral hernia repair/mesh  . INCISIONAL HERNIA REPAIR  01/13/2005   open w onlay Proceed mesh.  Marland Kitchen LAPAROSCOPIC LYSIS OF ADHESIONS  06/04/2015   Procedure: LAPAROSCOPIC LYSIS OF ADHESIONS;  Surgeon: Michael Boston, MD;  Location: [facility];  Service: General;;  . PROCTOSCOPY N/A 01/28/2016   Procedure: RIGID PROCTOSCOPY, DILATION OF COLORECTAL ANASTOMOTIC STRICTURE;  Surgeon: Michael Boston, MD;  Location: [facility];  Service: General;  Laterality: N/A;  . s/p right hip replaced min invasive hip surgury [facility] oct 2011 Right 2011  . tumor removed off of right shoulder Right   . XI ROBOTIC ASSISTED LOWER ANTERIOR RESECTION N/A 06/04/2015   Procedure: XI ROBOTIC ASSISTED LYSIS OF ADHESIONS, LOWER ANTERIOR RESECTION TRANS ABDOMINAL AND TRANS ANAL, COLOANAL HANDSEWN ANASTAOSIS, DIVERTING LOPE ILIOSTOMY, RESECTION GIST TUMOR;  Surgeon: Michael Boston, MD;  Location: [facility];   Service: General;  Laterality: N/A;    reports that she quit smoking about 42 years ago. Her smoking use included cigarettes. She smoked 0.00 packs per day. She has never used smokeless tobacco. She reports current alcohol use. She reports that she does not use drugs. family history includes Colon cancer in her father; Diabetes in her sister; Heart disease in her mother and sister. No Known Allergies Current Outpatient Medications on File Prior to Visit  Medication Sig Dispense Refill  . acetaminophen (TYLENOL) 500 MG tablet Take 1,000 mg by mouth every 6 (six) hours as needed for moderate pain or headache.    Marland Kitchen apixaban (ELIQUIS) 5 MG TABS tablet TAKE 1 TABLET TWICE DAILY (NEED MD APPOINTMENT) 180 tablet 0  . aspirin 81 MG tablet Take 81 mg by mouth at bedtime.     . benazepril (LOTENSIN) 40 MG tablet Take 1 tablet (40 mg total) by mouth every evening. Annual appt is due must see provider for future refills 90 tablet 0  . ferrous sulfate 325 (65 FE) MG tablet Take 1 tablet (325 mg total) by mouth 2 (two) times daily with a meal. 180 tablet 3  . imatinib (GLEEVEC) 100 MG tablet TAKE 2 TABLETS (200 MG TOTAL) BY MOUTH DAILY. TAKE WITH MEALS AND LARGE GLASS OF WATER.CAUTION:CHEMOTHERAPY 60 tablet 1  . loperamide (IMODIUM) 2 MG capsule Take 1-2 capsules (2-4 mg total) by mouth every 8 (eight) hours as needed for diarrhea or loose stools (Use if >2 BM every 8 hours). 30 capsule 0  . LORazepam (ATIVAN) 0.5 MG tablet Take 1 tablet (0.5 mg total) by mouth at bedtime as needed for anxiety. 30 tablet 0  . meloxicam (MOBIC) 15 MG tablet Take 1 tablet (15 mg total) by mouth daily as needed for pain. 30 tablet 5  . NYSTATIN powder APPLY TOPICALLY 3 TIMS DAILY AS NEEDED 15 g 0  . ofloxacin (OCUFLOX) 0.3 % ophthalmic solution Place 1 drop into both eyes 3 (three) times daily. Taper down    . omeprazole (PRILOSEC) 20 MG capsule TAKE 1 CAPSULE EVERY DAY (NEED MD APPOINTMENT) 90 capsule 0  . potassium chloride SA  (K-DUR,KLOR-CON) 20 MEQ tablet Take 1 tablet twice daily for 3 days, then take 1 tablet by mouth daily. 30 tablet 1  . pravastatin (PRAVACHOL) 20 MG tablet Take 1 tablet (20 mg total) by mouth daily. Annual appt is due must see provider for future refills 90 tablet 0  . prednisoLONE acetate (PRED FORTE) 1 % ophthalmic suspension Place 1 drop into both eyes 3 (three) times daily.     Marland Kitchen Propylhexedrine (BENZEDREX) INHA Place 1 each into the nose daily as needed (congestion).    . traMADol (ULTRAM) 50 MG tablet Take 1-2 tablets (50-100 mg total) by mouth every 6 (six) hours as needed for moderate pain. 40 tablet 1  . Vitamin D, Ergocalciferol, (DRISDOL) 50000 units CAPS capsule Take 1 capsule (50,000 Units total) by mouth every 7 (seven) days. 12 capsule 0   No current facility-administered medications on file  prior to visit.   Review of Systems All otherwise neg per pt     Objective:   Physical Exam BP (!) 144/84 (BP Location: Left Arm, Cuff Size: Large)   Temp 98.1 F (36.7 C) (Oral)   Ht 5\' 5"  (1.651 m)   Wt 200 lb 9.6 oz (91 kg)   BMI 33.38 kg/m  VS noted,  Constitutional: Pt appears in NAD HENT: Head: NCAT.  Right Ear: External ear normal.  Left Ear: External ear normal.  Eyes: . Pupils are equal, round, and reactive to light. Conjunctivae and EOM are normal Nose: without d/c or deformity Neck: Neck supple. Gross normal ROM Cardiovascular: Normal rate and regular rhythm.   Pulmonary/Chest: Effort normal and breath sounds without rales or wheezing.  Abd:  Soft, NT, ND, + BS, no organomegaly Neurological: Pt is alert. At baseline orientation, motor grossly intact Skin: Skin is warm. No rashes, other new lesions, no LE edema Psychiatric: Pt behavior is normal without agitation  All otherwise neg per pt Lab Results  Component Value Date   WBC 7.0 06/05/2019   HGB 12.7 06/05/2019   HCT 39.0 06/05/2019   PLT 330 06/05/2019   GLUCOSE 99 06/05/2019   CHOL 169 04/15/2018   TRIG  175.0 (H) 04/15/2018   HDL 38.80 (L) 04/15/2018   LDLDIRECT 49.0 09/14/2015   LDLCALC 95 04/15/2018   ALT 12 06/05/2019   AST 15 06/05/2019   NA 139 06/05/2019   K 3.6 06/05/2019   CL 108 06/05/2019   CREATININE 1.20 (H) 06/05/2019   BUN 14 06/05/2019   CO2 24 06/05/2019   TSH 0.09 (L) 04/15/2018   INR 0.94 01/26/2016   HGBA1C 5.7 04/15/2018       Assessment & Plan:

## 2019-06-18 NOTE — Assessment & Plan Note (Signed)

## 2019-06-19 ENCOUNTER — Other Ambulatory Visit: Payer: Self-pay | Admitting: Internal Medicine

## 2019-06-24 ENCOUNTER — Telehealth: Payer: Self-pay

## 2019-06-24 ENCOUNTER — Ambulatory Visit: Payer: Medicare HMO | Admitting: Internal Medicine

## 2019-06-24 NOTE — Telephone Encounter (Signed)
Pharmacy calling and spoke to [facility] on : 06/19/2019 1:19:21 PM and states that they are needing clarification on the below medication. States that Order is written for one patch -once a week but is typically a twice a week patch. [facility] Nurse told pharmacist would pass message to office but would need to handle non-emergency medications when office is open. Please call [facility] and advise.    estradiol (VIVELLE-DOT) 0.025 MG/24HR

## 2019-06-24 NOTE — Telephone Encounter (Signed)
Called and spoke with Starleen Blue at [facility] and clarified medication instructions

## 2019-06-25 MED FILL — IMATINIB MESYLATE 100 MG TA: 100 | 30 days supply | Qty: 60 | Fill #1

## 2019-07-09 DIAGNOSIS — H35351 Cystoid macular degeneration, right eye: Secondary | ICD-10-CM | POA: Diagnosis not present

## 2019-07-17 ENCOUNTER — Ambulatory Visit: Payer: Medicare HMO | Admitting: Oncology

## 2019-07-17 ENCOUNTER — Other Ambulatory Visit: Payer: Medicare HMO

## 2019-07-21 ENCOUNTER — Other Ambulatory Visit: Payer: Self-pay | Admitting: Oncology

## 2019-07-22 ENCOUNTER — Inpatient Hospital Stay: Payer: Medicare HMO | Attending: Oncology

## 2019-07-22 ENCOUNTER — Inpatient Hospital Stay (HOSPITAL_BASED_OUTPATIENT_CLINIC_OR_DEPARTMENT_OTHER): Payer: Medicare HMO | Admitting: Oncology

## 2019-07-22 ENCOUNTER — Other Ambulatory Visit: Payer: Self-pay

## 2019-07-22 ENCOUNTER — Ambulatory Visit: Payer: Medicare HMO | Admitting: Nurse Practitioner

## 2019-07-22 ENCOUNTER — Other Ambulatory Visit: Payer: Medicare HMO

## 2019-07-22 ENCOUNTER — Telehealth: Payer: Self-pay

## 2019-07-22 VITALS — BP 140/90 | HR 86 | Temp 97.8°F | Resp 18 | Ht 65.0 in | Wt 198.6 lb

## 2019-07-22 DIAGNOSIS — C49A4 Gastrointestinal stromal tumor of large intestine: Secondary | ICD-10-CM | POA: Diagnosis not present

## 2019-07-22 DIAGNOSIS — C49A5 Gastrointestinal stromal tumor of rectum: Secondary | ICD-10-CM | POA: Insufficient documentation

## 2019-07-22 DIAGNOSIS — Z7901 Long term (current) use of anticoagulants: Secondary | ICD-10-CM | POA: Diagnosis not present

## 2019-07-22 DIAGNOSIS — Z86711 Personal history of pulmonary embolism: Secondary | ICD-10-CM | POA: Insufficient documentation

## 2019-07-22 LAB — CBC WITH DIFFERENTIAL (CANCER CENTER ONLY)
Abs Immature Granulocytes: 0.02 10*3/uL (ref 0.00–0.07)
Basophils Absolute: 0 10*3/uL (ref 0.0–0.1)
Basophils Relative: 1 %
Eosinophils Absolute: 0.2 10*3/uL (ref 0.0–0.5)
Eosinophils Relative: 3 %
HCT: 39.4 % (ref 36.0–46.0)
Hemoglobin: 12.9 g/dL (ref 12.0–15.0)
Immature Granulocytes: 0 %
Lymphocytes Relative: 24 %
Lymphs Abs: 1.5 10*3/uL (ref 0.7–4.0)
MCH: 27.3 pg (ref 26.0–34.0)
MCHC: 32.7 g/dL (ref 30.0–36.0)
MCV: 83.5 fL (ref 80.0–100.0)
Monocytes Absolute: 0.3 10*3/uL (ref 0.1–1.0)
Monocytes Relative: 4 %
Neutro Abs: 4.4 10*3/uL (ref 1.7–7.7)
Neutrophils Relative %: 68 %
Platelet Count: 325 10*3/uL (ref 150–400)
RBC: 4.72 MIL/uL (ref 3.87–5.11)
RDW: 14.3 % (ref 11.5–15.5)
WBC Count: 6.4 10*3/uL (ref 4.0–10.5)
nRBC: 0 % (ref 0.0–0.2)

## 2019-07-22 LAB — CMP (CANCER CENTER ONLY)
ALT: 7 U/L (ref 0–44)
AST: 12 U/L — ABNORMAL LOW (ref 15–41)
Albumin: 3.3 g/dL — ABNORMAL LOW (ref 3.5–5.0)
Alkaline Phosphatase: 125 U/L (ref 38–126)
Anion gap: 9 (ref 5–15)
BUN: 12 mg/dL (ref 8–23)
CO2: 24 mmol/L (ref 22–32)
Calcium: 8.9 mg/dL (ref 8.9–10.3)
Chloride: 110 mmol/L (ref 98–111)
Creatinine: 1.18 mg/dL — ABNORMAL HIGH (ref 0.44–1.00)
GFR, Est AFR Am: 54 mL/min — ABNORMAL LOW (ref >60)
GFR, Estimated: 46 mL/min — ABNORMAL LOW (ref >60)
Glucose, Bld: 96 mg/dL (ref 70–99)
Potassium: 3.9 mmol/L (ref 3.5–5.1)
Sodium: 143 mmol/L (ref 135–145)
Total Bilirubin: 0.9 mg/dL (ref 0.3–1.2)
Total Protein: 7.7 g/dL (ref 6.5–8.1)

## 2019-07-22 NOTE — Progress Notes (Signed)
[place] OFFICE PROGRESS NOTE   Diagnosis: Gastrointestinal stromal tumor  INTERVAL HISTORY:   Ms. Wharff returns as scheduled.  She continues Gleevec.  No diarrhea or rash.  No new complaint.  Objective:  Vital signs in last 24 hours:  Blood pressure 140/90, pulse 86, temperature 97.8 F (36.6 C), temperature source Temporal, resp. rate 18, height 5\' 5"  (1.651 m), weight 198 lb 9.6 oz (90.1 kg), SpO2 99 %.     Resp: Lungs with end inspiratory fine rales at the bases, no respiratory distress Cardio: Regular rate and rhythm GI: Right abdominal hernia, no hepatomegaly, nontender, no mass Vascular: No leg edema  Skin: Mild yeast rash beneath the right breast    Lab Results:  Lab Results  Component Value Date   WBC 6.4 07/22/2019   HGB 12.9 07/22/2019   HCT 39.4 07/22/2019   MCV 83.5 07/22/2019   PLT 325 07/22/2019   NEUTROABS 4.4 07/22/2019    CMP  Lab Results  Component Value Date   NA 143 07/22/2019   K 3.9 07/22/2019   CL 110 07/22/2019   CO2 24 07/22/2019   GLUCOSE 96 07/22/2019   BUN 12 07/22/2019   CREATININE 1.18 (H) 07/22/2019   CALCIUM 8.9 07/22/2019   PROT 7.7 07/22/2019   ALBUMIN 3.3 (L) 07/22/2019   AST 12 (L) 07/22/2019   ALT 7 07/22/2019   ALKPHOS 125 07/22/2019   BILITOT 0.9 07/22/2019   GFRNONAA 46 (L) 07/22/2019   GFRAA 54 (L) 07/22/2019     Medications: I have reviewed the patient's current medications.   Assessment/Plan: 1.Gastrointestinal stromal tumor of the rectum  Status post an endoscopic ultrasound on 08/06/2014 confirming a mass abutting the distal rectal wall with an FNA biopsy confirming a gastrointestinal stromal tumor  Initiation of Gleevec 09/04/2014  MRI pelvis 12/15/2014 with interval decreased size of the large anorectal mass with central necrosis.  Continuation of Gleevec  Gleevec placed on hold 06/02/2015 in anticipation of surgery  06/04/2015 status post low anterior resection and  diverting loop ileostomy, resection of tumor  Pathology: 9.5 cm tumor; GISTsubtype spindle; mitotic rate 1/50 high-power field; positive margin of resection  Adjuvant Gleevec 08/19/2015 (three-year in total course planned)  Ileostomy takedown 01/28/2016  Held 01/28/2016 through 02/14/2016, resumed 02/15/2016  Gleevec stop 04/11/2016-04/18/2016, and 04/26/2016-05/08/2016  Gleevec resumed at a dose of 200 mg daily on 05/09/2016  Gleevec dose increased to 300 mg daily beginning 06/20/2016  Gleevec dose decreased to 200 mg daily beginning 06/23/2016 (poor tolerance at 300 mg dose)  CTs 11/22/2017-enhancing soft tissue mass in the presacral space measuring 5.4 x 3.5 cm. Enlarged pelvic lymph nodes.  Gleevec continued at a dose of 200 mg daily  Restaging CTs 03/11/2018-interval decrease in size of the presacral soft tissue mass. Stable small pelvic lymph nodes.  Gleevec discontinued 03/13/2018  CT 11/14/2018-new 2.4 cm left pelvic nodule at the piriformis muscle, stable presacral lymph nodes, right abdominal wall hernia, ventral hernia  Gleevec 200 mg daily 12/04/2018  CTs 04/01/2019-decrease in size of soft tissue nodule deep left pelvis.  5 nodules in the sigmoid mesocolon stable to slightly larger.  Stable presacral soft tissue thickening.  No findings for new metastatic disease. 2. Remote history of pulmonary embolism-maintained on apixaban 3. Multiple colon polyps noted on the colonoscopy 07/21/2014 with the pathology revealing tubular adenomas 4. Hospitalization 06/21/2015 through 07/06/2015 with nausea/vomiting, high ileostomy output; found to have delayed gastric emptying. 5. Water soluble contrast enema 08/13/2015 positive for a small to  moderate volume of water soluble contrast leakage from the low anterior resection and anastomosis site in the pelvis 6. Ileostomy takedown and resection of cystic abdominal wall mass ( benign pathology) on 01/28/2016 7. Anemia with ferritin in  the low normal range 05/23/2016 and 06/20/2016. Ferrous sulfate increased to twice daily 06/26/2016.Improved.     Disposition: Brianna Walker appears stable.  She will continue Gleevec.  She will undergo a restaging CT abdomen/pelvis prior to an office visit in 2 weeks.  I will contact Dr. Jenny Reichmann regarding the indication to continue both apixaban and aspirin.  Betsy Coder, MD  07/22/2019  10:04 AM

## 2019-07-22 NOTE — Telephone Encounter (Signed)
Spoke with pt per MD Benay Spice and informed her to stop taking her low-dose aspirin. Pt verbalizes understanding and agrees with plan of care.

## 2019-07-23 ENCOUNTER — Telehealth: Payer: Self-pay | Admitting: Oncology

## 2019-07-23 NOTE — Telephone Encounter (Signed)
Scheduled per los. Called and spoke with patient. Confirmed appt 

## 2019-07-25 MED FILL — IMATINIB MESYLATE 100 MG TA: 100 | 30 days supply | Qty: 60 | Fill #0

## 2019-08-06 DIAGNOSIS — H43821 Vitreomacular adhesion, right eye: Secondary | ICD-10-CM | POA: Diagnosis not present

## 2019-08-14 ENCOUNTER — Encounter: Payer: Self-pay | Admitting: Gastroenterology

## 2019-08-27 MED FILL — IMATINIB MESYLATE 100 MG TA: 100 | 30 days supply | Qty: 60 | Fill #1

## 2019-09-01 ENCOUNTER — Encounter (HOSPITAL_COMMUNITY): Payer: Self-pay

## 2019-09-01 ENCOUNTER — Other Ambulatory Visit: Payer: Self-pay

## 2019-09-01 ENCOUNTER — Ambulatory Visit (HOSPITAL_COMMUNITY)
Admission: RE | Admit: 2019-09-01 | Discharge: 2019-09-01 | Disposition: A | Discharge status: Home / Self Care | Payer: Medicare Other | Source: Ambulatory Visit | Attending: Oncology | Admitting: Oncology

## 2019-09-01 DIAGNOSIS — C49A0 Gastrointestinal stromal tumor, unspecified site: Secondary | ICD-10-CM | POA: Diagnosis not present

## 2019-09-01 DIAGNOSIS — C49A4 Gastrointestinal stromal tumor of large intestine: Secondary | ICD-10-CM | POA: Insufficient documentation

## 2019-09-01 MED ORDER — IOHEXOL 9 MG/ML PO SOLN
500.0000 mL | ORAL | Status: AC
  Administered 2019-09-01 (×2): 500 mL via ORAL

## 2019-09-01 MED ORDER — IOHEXOL 9 MG/ML PO SOLN
ORAL | Status: AC
  Filled 2019-09-01: qty 1000

## 2019-09-02 ENCOUNTER — Inpatient Hospital Stay: Payer: Medicare Other

## 2019-09-02 ENCOUNTER — Inpatient Hospital Stay: Payer: Medicare Other | Attending: Oncology | Admitting: Nurse Practitioner

## 2019-09-02 ENCOUNTER — Encounter: Payer: Self-pay | Admitting: Nurse Practitioner

## 2019-09-02 ENCOUNTER — Other Ambulatory Visit: Payer: Self-pay

## 2019-09-02 VITALS — BP 137/82 | HR 101 | Temp 98.2°F | Resp 20 | Ht 65.0 in | Wt 196.4 lb

## 2019-09-02 DIAGNOSIS — Z86010 Personal history of colonic polyps: Secondary | ICD-10-CM | POA: Diagnosis not present

## 2019-09-02 DIAGNOSIS — Z862 Personal history of diseases of the blood and blood-forming organs and certain disorders involving the immune mechanism: Secondary | ICD-10-CM | POA: Diagnosis not present

## 2019-09-02 DIAGNOSIS — C49A4 Gastrointestinal stromal tumor of large intestine: Secondary | ICD-10-CM | POA: Diagnosis not present

## 2019-09-02 DIAGNOSIS — C49A5 Gastrointestinal stromal tumor of rectum: Secondary | ICD-10-CM | POA: Insufficient documentation

## 2019-09-02 DIAGNOSIS — Z7901 Long term (current) use of anticoagulants: Secondary | ICD-10-CM | POA: Diagnosis not present

## 2019-09-02 DIAGNOSIS — Z86711 Personal history of pulmonary embolism: Secondary | ICD-10-CM | POA: Insufficient documentation

## 2019-09-02 LAB — CBC WITH DIFFERENTIAL (CANCER CENTER ONLY)
Abs Immature Granulocytes: 0.02 10*3/uL (ref 0.00–0.07)
Basophils Absolute: 0.1 10*3/uL (ref 0.0–0.1)
Basophils Relative: 1 %
Eosinophils Absolute: 0.1 10*3/uL (ref 0.0–0.5)
Eosinophils Relative: 1 %
HCT: 40.2 % (ref 36.0–46.0)
Hemoglobin: 13.1 g/dL (ref 12.0–15.0)
Immature Granulocytes: 0 %
Lymphocytes Relative: 25 %
Lymphs Abs: 1.7 10*3/uL (ref 0.7–4.0)
MCH: 27.7 pg (ref 26.0–34.0)
MCHC: 32.6 g/dL (ref 30.0–36.0)
MCV: 85 fL (ref 80.0–100.0)
Monocytes Absolute: 0.3 10*3/uL (ref 0.1–1.0)
Monocytes Relative: 5 %
Neutro Abs: 4.6 10*3/uL (ref 1.7–7.7)
Neutrophils Relative %: 68 %
Platelet Count: 339 10*3/uL (ref 150–400)
RBC: 4.73 MIL/uL (ref 3.87–5.11)
RDW: 14.3 % (ref 11.5–15.5)
WBC Count: 6.8 10*3/uL (ref 4.0–10.5)
nRBC: 0 % (ref 0.0–0.2)

## 2019-09-02 LAB — CMP (CANCER CENTER ONLY)
ALT: 7 U/L (ref 0–44)
AST: 11 U/L — ABNORMAL LOW (ref 15–41)
Albumin: 3.3 g/dL — ABNORMAL LOW (ref 3.5–5.0)
Alkaline Phosphatase: 128 U/L — ABNORMAL HIGH (ref 38–126)
Anion gap: 10 (ref 5–15)
BUN: 10 mg/dL (ref 8–23)
CO2: 22 mmol/L (ref 22–32)
Calcium: 8.8 mg/dL — ABNORMAL LOW (ref 8.9–10.3)
Chloride: 110 mmol/L (ref 98–111)
Creatinine: 1.14 mg/dL — ABNORMAL HIGH (ref 0.44–1.00)
GFR, Est AFR Am: 56 mL/min — ABNORMAL LOW (ref >60)
GFR, Estimated: 48 mL/min — ABNORMAL LOW (ref >60)
Glucose, Bld: 113 mg/dL — ABNORMAL HIGH (ref 70–99)
Potassium: 3.7 mmol/L (ref 3.5–5.1)
Sodium: 142 mmol/L (ref 135–145)
Total Bilirubin: 1 mg/dL (ref 0.3–1.2)
Total Protein: 7.9 g/dL (ref 6.5–8.1)

## 2019-09-02 NOTE — Progress Notes (Addendum)
[place] OFFICE PROGRESS NOTE   Diagnosis: Gastrointestinal stromal tumor  INTERVAL HISTORY:   Ms. Roosevelt returns as scheduled.  She continues Gleevec.  She feels well.  No nausea or vomiting.  No mouth sores.  No diarrhea.  She denies edema.  No rash.  She reports a good appetite.  No abdominal pain.  Objective:  Vital signs in last 24 hours:  Blood pressure 137/82, pulse (!) 101, temperature 98.2 F (36.8 C), temperature source Temporal, resp. rate 20, height 5\' 5"  (1.651 m), weight 196 lb 6.4 oz (89.1 kg), SpO2 99 %.    HEENT: No thrush or ulcers. Resp: Lungs clear bilaterally. Cardio: Regular rate and rhythm. GI: Abdomen soft and nontender.  No hepatomegaly.  No mass. Vascular: No leg edema. Neuro: Alert and oriented. Skin: No rash.   Lab Results:  Lab Results  Component Value Date   WBC 6.8 09/02/2019   HGB 13.1 09/02/2019   HCT 40.2 09/02/2019   MCV 85.0 09/02/2019   PLT 339 09/02/2019   NEUTROABS 4.6 09/02/2019    Imaging:  CT ABDOMEN PELVIS WO CONTRAST  Result Date: 09/01/2019 CLINICAL DATA:  Restaging GIST. Follow-up sigmoid mesenteric and pelvic nodules. Status post cholecystectomy and colostomy with reversal. EXAM: CT ABDOMEN AND PELVIS WITHOUT CONTRAST TECHNIQUE: Multidetector CT imaging of the abdomen and pelvis was performed following the standard protocol without IV contrast. COMPARISON:  04/01/2019 FINDINGS: Lower chest: Scarring in the bilateral lower lobes. Three vessel coronary atherosclerosis. Hepatobiliary: Unenhanced liver is unremarkable. Status post cholecystectomy. No intrahepatic or extrahepatic ductal dilatation. Pancreas: Within normal limits. Spleen: Within normal limits. Adrenals/Urinary Tract: Mild low-density thickening of the bilateral adrenal glands, unchanged. Kidneys are unremarkable. No renal calculi or hydronephrosis. Bladder is partially obscured by streak artifact but grossly unremarkable. Stomach/Bowel: Stomach is  notable for a small hiatal hernia. No evidence of bowel obstruction. Normal appendix (series 2/image 63). Scattered colonic diverticulosis, without convincing findings to suggest acute diverticulitis. Stable presacral soft tissue stranding (series 2/image 37), likely reflecting post treatment changes. Vascular/Lymphatic: No evidence of abdominal aortic aneurysm. Atherosclerotic calcifications of the abdominal aorta and branch vessels. No suspicious abdominopelvic lymphadenopathy. 8 mm short axis portacaval node (series 2/image 31), within normal limits. Reproductive: Status post hysterectomy. Bilateral ovaries are within normal limits. Other: Diastasis of the midline anterior abdominal wall containing two small loops of transverse colon (series 2/images 43 and 46), unchanged. Moderate right paramidline ventral hernia containing multiple loops of nondilated small bowel (series 2/image 58), unchanged. Presacral, sigmoid mesenteric, and left pelvic/perirectal nodularity is progressive, including: --Dominant 1.9 x 2.3 cm presacral nodule (series 2/image 65), previously 12 x 16 mm --Additional scattered small presacral nodules along the sigmoid mesocolon measuring up to 7 mm (series 2/image 67), similar --13 x 16 mm left perirectal nodule (series 2/image 78), previously 8 mm --12 x 14 left pelvic nodule (series 2/image 81), grossly unchanged Musculoskeletal: Degenerative changes of the visualized thoracolumbar spine. IMPRESSION: Progressive presacral and left perirectal nodularity, as above. Additional mesenteric nodules along the sigmoid mesocolon and left pelvis are grossly unchanged. Stable presacral soft tissue stranding, likely reflecting post treatment changes. Stable ventral hernias, as above. Electronically Signed   By: Julian Hy M.D.   On: 09/01/2019 11:17    Medications: I have reviewed the patient's current medications.  Assessment/Plan: 1.Gastrointestinal stromal tumor of the rectum  Status  post an endoscopic ultrasound on 08/06/2014 confirming a mass abutting the distal rectal wall with an FNA biopsy confirming a gastrointestinal stromal tumor  Initiation of Gleevec 09/04/2014  MRI pelvis 12/15/2014 with interval decreased size of the large anorectal mass with central necrosis.  Continuation of Gleevec  Gleevec placed on hold 06/02/2015 in anticipation of surgery  06/04/2015 status post low anterior resection and diverting loop ileostomy, resection of tumor  Pathology: 9.5 cm tumor; GISTsubtype spindle; mitotic rate 1/50 high-power field; positive margin of resection  Adjuvant Gleevec 08/19/2015 (three-year in total course planned)  Ileostomy takedown 01/28/2016  Held 01/28/2016 through 02/14/2016, resumed 02/15/2016  Gleevec stop 04/11/2016-04/18/2016, and 04/26/2016-05/08/2016  Gleevec resumed at a dose of 200 mg daily on 05/09/2016  Gleevec dose increased to 300 mg daily beginning 06/20/2016  Gleevec dose decreased to 200 mg daily beginning 06/23/2016 (poor tolerance at 300 mg dose)  CTs 11/22/2017-enhancing soft tissue mass in the presacral space measuring 5.4 x 3.5 cm. Enlarged pelvic lymph nodes.  Gleevec continued at a dose of 200 mg daily  Restaging CTs 03/11/2018-interval decrease in size of the presacral soft tissue mass. Stable small pelvic lymph nodes.  Gleevec discontinued 03/13/2018  CT 11/14/2018-new 2.4 cm left pelvic nodule at the piriformis muscle, stable presacral lymph nodes, right abdominal wall hernia, ventral hernia  Gleevec 200 mg daily 12/04/2018  CTs 03/31/2019 -decrease in size of soft tissue nodule deep left pelvis. 5 nodules in the sigmoid mesocolon stable to slightly larger. Stable presacral soft tissue thickening. No findings for new metastatic disease.  CT abdomen/pelvis 09/01/2019-progressive presacral and left perirectal nodularity.  Additional mesenteric nodules along the sigmoid mesocolon and left pelvis  unchanged.  Gleevec dose escalated to 300 mg daily 09/02/2019 2. Remote history of pulmonary embolism-maintained on apixaban 3. Multiple colon polyps noted on the colonoscopy 07/21/2014 with the pathology revealing tubular adenomas 4. Hospitalization 06/21/2015 through 07/06/2015 with nausea/vomiting, high ileostomy output; found to have delayed gastric emptying. 5. Water soluble contrast enema 08/13/2015 positive for a small to moderate volume of water soluble contrast leakage from the low anterior resection and anastomosis site in the pelvis 6. Ileostomy takedown and resection of cystic abdominal wall mass ( benign pathology) on 01/28/2016 7. Anemia with ferritin in the low normal range 05/23/2016 and 06/20/2016. Ferrous sulfate increased to twice daily 06/26/2016.Improved.    Disposition: Brianna Walker appears well.  She is currently on Gleevec 200 mg daily and tolerating well.  The CT scan from yesterday shows minimal progression in the pelvis.  We reviewed the results/images with her at today's visit.  Dr. Benay Spice recommends increasing Gleevec to 300 mg daily with the plan to eventually escalate to 400 mg daily pending tolerance.  She understands to contact the office with diarrhea, rash.  She will return for lab and follow-up in 2 weeks.  Patient seen with Dr. Benay Spice.    Ned Card ANP/GNP-BC   09/02/2019  10:00 AM  This was a shared visit with Ned Card.  We reviewed the CT images with Ms. Dewilde.  At least one of the pelvic nodules is larger.  The plan is to dose escalate the Gleevec as tolerated.  Julieanne Manson, MD

## 2019-09-03 ENCOUNTER — Telehealth: Payer: Self-pay | Admitting: Nurse Practitioner

## 2019-09-03 NOTE — Telephone Encounter (Signed)
Scheduled per los. Called and spoke with patient. Confirmed appt 

## 2019-09-16 ENCOUNTER — Inpatient Hospital Stay (HOSPITAL_BASED_OUTPATIENT_CLINIC_OR_DEPARTMENT_OTHER): Payer: Medicare Other | Admitting: Nurse Practitioner

## 2019-09-16 ENCOUNTER — Encounter: Payer: Self-pay | Admitting: Nurse Practitioner

## 2019-09-16 ENCOUNTER — Inpatient Hospital Stay: Payer: Medicare Other

## 2019-09-16 ENCOUNTER — Other Ambulatory Visit: Payer: Self-pay

## 2019-09-16 ENCOUNTER — Other Ambulatory Visit: Payer: Self-pay | Admitting: Emergency Medicine

## 2019-09-16 VITALS — BP 158/94 | HR 78 | Temp 98.3°F | Resp 16 | Ht 65.0 in

## 2019-09-16 DIAGNOSIS — Z7901 Long term (current) use of anticoagulants: Secondary | ICD-10-CM | POA: Diagnosis not present

## 2019-09-16 DIAGNOSIS — Z86711 Personal history of pulmonary embolism: Secondary | ICD-10-CM | POA: Diagnosis not present

## 2019-09-16 DIAGNOSIS — C49A4 Gastrointestinal stromal tumor of large intestine: Secondary | ICD-10-CM | POA: Diagnosis not present

## 2019-09-16 DIAGNOSIS — Z86010 Personal history of colonic polyps: Secondary | ICD-10-CM | POA: Diagnosis not present

## 2019-09-16 DIAGNOSIS — C49A5 Gastrointestinal stromal tumor of rectum: Secondary | ICD-10-CM | POA: Diagnosis not present

## 2019-09-16 DIAGNOSIS — Z862 Personal history of diseases of the blood and blood-forming organs and certain disorders involving the immune mechanism: Secondary | ICD-10-CM | POA: Diagnosis not present

## 2019-09-16 LAB — CMP (CANCER CENTER ONLY)
ALT: 6 U/L (ref 0–44)
AST: 12 U/L — ABNORMAL LOW (ref 15–41)
Albumin: 3.1 g/dL — ABNORMAL LOW (ref 3.5–5.0)
Alkaline Phosphatase: 115 U/L (ref 38–126)
Anion gap: 10 (ref 5–15)
BUN: 10 mg/dL (ref 8–23)
CO2: 26 mmol/L (ref 22–32)
Calcium: 8.7 mg/dL — ABNORMAL LOW (ref 8.9–10.3)
Chloride: 107 mmol/L (ref 98–111)
Creatinine: 1.17 mg/dL — ABNORMAL HIGH (ref 0.44–1.00)
GFR, Est AFR Am: 54 mL/min — ABNORMAL LOW (ref >60)
GFR, Estimated: 47 mL/min — ABNORMAL LOW (ref >60)
Glucose, Bld: 86 mg/dL (ref 70–99)
Potassium: 3.7 mmol/L (ref 3.5–5.1)
Sodium: 143 mmol/L (ref 135–145)
Total Bilirubin: 0.6 mg/dL (ref 0.3–1.2)
Total Protein: 7.3 g/dL (ref 6.5–8.1)

## 2019-09-16 LAB — CBC WITH DIFFERENTIAL (CANCER CENTER ONLY)
Abs Immature Granulocytes: 0.02 10*3/uL (ref 0.00–0.07)
Basophils Absolute: 0 10*3/uL (ref 0.0–0.1)
Basophils Relative: 1 %
Eosinophils Absolute: 0.1 10*3/uL (ref 0.0–0.5)
Eosinophils Relative: 1 %
HCT: 38.2 % (ref 36.0–46.0)
Hemoglobin: 12.4 g/dL (ref 12.0–15.0)
Immature Granulocytes: 0 %
Lymphocytes Relative: 24 %
Lymphs Abs: 1.5 10*3/uL (ref 0.7–4.0)
MCH: 27.7 pg (ref 26.0–34.0)
MCHC: 32.5 g/dL (ref 30.0–36.0)
MCV: 85.3 fL (ref 80.0–100.0)
Monocytes Absolute: 0.4 10*3/uL (ref 0.1–1.0)
Monocytes Relative: 6 %
Neutro Abs: 4.4 10*3/uL (ref 1.7–7.7)
Neutrophils Relative %: 68 %
Platelet Count: 322 10*3/uL (ref 150–400)
RBC: 4.48 MIL/uL (ref 3.87–5.11)
RDW: 14.8 % (ref 11.5–15.5)
WBC Count: 6.4 10*3/uL (ref 4.0–10.5)
nRBC: 0 % (ref 0.0–0.2)

## 2019-09-16 MED ORDER — IMATINIB MESYLATE 100 MG PO TABS: ORAL_TABLET | ORAL | 0 refills | Status: DC

## 2019-09-16 NOTE — Progress Notes (Signed)
[place] OFFICE PROGRESS NOTE   Diagnosis: Gastrointestinal stromal tumor  INTERVAL HISTORY:   Brianna Walker returns as scheduled.  Gleevec dose was escalated to 300 mg daily beginning 09/02/2019.  She estimates having 2 loose stools since increasing the Gleevec dose.  On both occasions she took 2 Imodium tablets and the diarrhea was relieved.  She denies nausea/vomiting.  No mouth sores.  No abdominal pain.  She has a good appetite.  Objective:  Vital signs in last 24 hours:  Blood pressure (!) 158/94, pulse 78, temperature 98.3 F (36.8 C), temperature source Temporal, resp. rate 16, height 5\' 5"  (1.651 m), SpO2 99 %.    HEENT: No thrush or ulcers. Resp: Lungs clear bilaterally. Cardio: Regular rate and rhythm. GI: Abdomen soft and nontender.  No hepatomegaly.  No mass. Vascular: No leg edema. Skin: No rash.   Lab Results:  Lab Results  Component Value Date   WBC 6.4 09/16/2019   HGB 12.4 09/16/2019   HCT 38.2 09/16/2019   MCV 85.3 09/16/2019   PLT 322 09/16/2019   NEUTROABS 4.4 09/16/2019    Imaging:  No results found.  Medications: I have reviewed the patient's current medications.  Assessment/Plan: 1.Gastrointestinal stromal tumor of the rectum  Status post an endoscopic ultrasound on 08/06/2014 confirming a mass abutting the distal rectal wall with an FNA biopsy confirming a gastrointestinal stromal tumor  Initiation of Gleevec 09/04/2014  MRI pelvis 12/15/2014 with interval decreased size of the large anorectal mass with central necrosis.  Continuation of Gleevec  Gleevec placed on hold 06/02/2015 in anticipation of surgery  06/04/2015 status post low anterior resection and diverting loop ileostomy, resection of tumor  Pathology: 9.5 cm tumor; GISTsubtype spindle; mitotic rate 1/50 high-power field; positive margin of resection  Adjuvant Gleevec 08/19/2015 (three-year in total course planned)  Ileostomy takedown 01/28/2016  Held  01/28/2016 through 02/14/2016, resumed 02/15/2016  Gleevec stop 04/11/2016-04/18/2016, and 04/26/2016-05/08/2016  Gleevec resumed at a dose of 200 mg daily on 05/09/2016  Gleevec dose increased to 300 mg daily beginning 06/20/2016  Gleevec dose decreased to 200 mg daily beginning 06/23/2016 (poor tolerance at 300 mg dose)  CTs 11/22/2017-enhancing soft tissue mass in the presacral space measuring 5.4 x 3.5 cm. Enlarged pelvic lymph nodes.  Gleevec continued at a dose of 200 mg daily  Restaging CTs 03/11/2018-interval decrease in size of the presacral soft tissue mass. Stable small pelvic lymph nodes.  Gleevec discontinued 03/13/2018  CT 11/14/2018-new 2.4 cm left pelvic nodule at the piriformis muscle, stable presacral lymph nodes, right abdominal wall hernia, ventral hernia  Gleevec 200 mg daily 12/04/2018  CTs 03/31/2019 -decrease in size of soft tissue nodule deep left pelvis. 5 nodules in the sigmoid mesocolon stable to slightly larger. Stable presacral soft tissue thickening. No findings for new metastatic disease.  CT abdomen/pelvis 09/01/2019-progressive presacral and left perirectal nodularity.  Additional mesenteric nodules along the sigmoid mesocolon and left pelvis unchanged.  Gleevec dose escalated to 300 mg daily 09/02/2019  Gleevec dose escalated to 300 mg alternating with 400 mg beginning 09/16/2019 2. Remote history of pulmonary embolism-maintained on apixaban 3. Multiple colon polyps noted on the colonoscopy 07/21/2014 with the pathology revealing tubular adenomas 4. Hospitalization 06/21/2015 through 07/06/2015 with nausea/vomiting, high ileostomy output; found to have delayed gastric emptying. 5. Water soluble contrast enema 08/13/2015 positive for a small to moderate volume of water soluble contrast leakage from the low anterior resection and anastomosis site in the pelvis 6. Ileostomy takedown and resection of cystic  abdominal wall mass ( benign pathology) on  01/28/2016 7. Anemia with ferritin in the low normal range 05/23/2016 and 06/20/2016. Ferrous sulfate increased to twice daily 06/26/2016.Improved.    Disposition: Brianna Walker appears stable.  Gleevec dose was escalated from 200 mg daily to 300 mg daily 2 weeks ago.  She is tolerating the dose escalation well overall with 2 episodes of diarrhea, relieved with Imodium.  We discussed escalating the dose to 400 mg daily.  She would like to alternate 300 mg with 400 mg for 2 weeks and then reevaluate.  She will take the 400 mg dose today, 300 mg tomorrow and so forth.  We will send a new prescription to the pharmacy.  She will return for lab and follow-up in 2 weeks.  Plan reviewed with Dr. Benay Spice.  Ned Card ANP/GNP-BC   09/16/2019  11:23 AM

## 2019-09-17 ENCOUNTER — Telehealth: Payer: Self-pay | Admitting: Nurse Practitioner

## 2019-09-17 NOTE — Telephone Encounter (Signed)
Scheduled per los. Called, not able to leave msg. Mailed printout  

## 2019-09-22 ENCOUNTER — Telehealth: Payer: Self-pay

## 2019-09-22 ENCOUNTER — Telehealth: Payer: Self-pay | Admitting: Emergency Medicine

## 2019-09-22 MED FILL — IMATINIB MESYLATE 100 MG TA: 100 | 28 days supply | Qty: 98 | Fill #0

## 2019-09-22 NOTE — Telephone Encounter (Signed)
TC to patient in regard to her message about needing a refill on her [place]. I let her know that this medication was refilled already on 09/16/19 and was sent to [facility] outpatient pharmacy. Patient verbalized understanding and stated that she would go by and pick it up today.

## 2019-09-22 NOTE — Telephone Encounter (Signed)
Incoming call on triage line from pt reporting that she has no more refills of her Gleevec and that she needs more sent from [facility] to her home.  Jasmin LPN for NP Lisa's desk made aware, states she will call pt to f/u.  Pt verbalized understanding to expect f/u call.

## 2019-09-24 ENCOUNTER — Ambulatory Visit (INDEPENDENT_AMBULATORY_CARE_PROVIDER_SITE_OTHER): Payer: Medicare Other | Admitting: Gastroenterology

## 2019-09-24 ENCOUNTER — Telehealth: Payer: Self-pay

## 2019-09-24 ENCOUNTER — Encounter: Payer: Self-pay | Admitting: Gastroenterology

## 2019-09-24 VITALS — BP 110/78 | HR 76 | Ht 64.0 in | Wt 195.0 lb

## 2019-09-24 DIAGNOSIS — C49A4 Gastrointestinal stromal tumor of large intestine: Secondary | ICD-10-CM | POA: Diagnosis not present

## 2019-09-24 DIAGNOSIS — Z7901 Long term (current) use of anticoagulants: Secondary | ICD-10-CM | POA: Diagnosis not present

## 2019-09-24 DIAGNOSIS — Z86010 Personal history of colonic polyps: Secondary | ICD-10-CM

## 2019-09-24 DIAGNOSIS — Z860101 Personal history of adenomatous and serrated colon polyps: Secondary | ICD-10-CM

## 2019-09-24 MED ORDER — SUTAB 1479-225-188 MG PO TABS: 1.0000 | ORAL_TABLET | ORAL | 0 refills | Status: DC

## 2019-09-24 NOTE — Telephone Encounter (Signed)
Yes, ok for off eliquis 2 days prior to procedure

## 2019-09-24 NOTE — Patient Instructions (Signed)
You have been scheduled for a colonoscopy. Please follow written instructions given to you at your visit today.  Please pick up your prep supplies at the pharmacy within the next 1-3 days. If you use inhalers (even only as needed), please bring them with you on the day of your procedure.  Thank you for choosing me and [facility].  Malcolm T. Stark, Jr., MD., FACG  

## 2019-09-24 NOTE — Progress Notes (Signed)
History of Present Illness: This is a 74 year old female referred by Biagio Borg, MD for the evaluation of rectal/pelvic GIST and personal history of adenomatous colon polyps.  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  She underwent of low anterior resection with coloanal anastomosis and diverting loop ileostomy in February 2017.  Margin was positive.  She underwent ileostomy takedown in September 2017.  In addition she has a history of adenomatous colon polyps. She is followed by Ned Card, NP in oncology and is currently taking Gleevec 300 mg daily.  She has intermittent diarrhea and intermittent small volume rectal bleeding.   Colonoscopy 06/2014 1. Four sessile polyps 5-8 mm in the sigmoid, descending, and transverse colon; polypectomies performed with a cold snare 2. Moderate diverticulosis in the sigmoid colon 3. Half circumferential, firm, friable submucosal mass in the rectum; multiple biopsies of the lesion performed  RECTAL ENDOSCOPIC IMPRESSION 07/2014: Large perirectal mass, unclear if this originates from the wall of the distall rectum. The mass was sampled with EUS/FNA. Preliminary cytology review suggests spindle cell neoplasm. 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 She is already set to meet with Dr. Johney Maine next week to consider resection. Progressive presacral and left perirectal nodularity, as above. Additional mesenteric nodules along the sigmoid mesocolon and left pelvis are grossly unchanged.  CT AP 09/01/2019 Progressive presacral and left perirectal nodularity, as above. Additional mesenteric nodules along the sigmoid mesocolon and left pelvis are grossly unchanged Stable presacral soft tissue stranding, likely reflecting post treatment changes. Stable ventral hernias, as above.    No Known Allergies Outpatient Medications Prior to Visit  Medication Sig Dispense Refill  . acetaminophen (TYLENOL) 500 MG  tablet Take 1,000 mg by mouth every 6 (six) hours as needed for moderate pain or headache.    Marland Kitchen apixaban (ELIQUIS) 5 MG TABS tablet TAKE 1 TABLET TWICE DAILY (NEED MD APPOINTMENT) 180 tablet 0  . benazepril (LOTENSIN) 40 MG tablet Take 1 tablet (40 mg total) by mouth every evening. Annual appt is due must see provider for future refills 90 tablet 0  . estradiol (VIVELLE-DOT) 0.025 MG/24HR Place 1 patch onto the skin 2 (two) times a week. 24 patch 3  . ferrous sulfate 325 (65 FE) MG tablet Take 1 tablet (325 mg total) by mouth 2 (two) times daily with a meal. 180 tablet 3  . imatinib (GLEEVEC) 100 MG tablet TAKE 3 TABLETS (300MG ) ALTERNATING WITH 4 TABLETS (400MG ) EVERY OTHER DAY FOR 28 DAYS.Take with meals and large glass of water.Caution:Chemotherapy 98 tablet 0  . loperamide (IMODIUM) 2 MG capsule Take 1-2 capsules (2-4 mg total) by mouth every 8 (eight) hours as needed for diarrhea or loose stools (Use if >2 BM every 8 hours). 30 capsule 0  . NYSTATIN powder APPLY TOPICALLY 3 TIMS DAILY AS NEEDED 15 g 0  . ofloxacin (OCUFLOX) 0.3 % ophthalmic solution Place 1 drop into both eyes 3 (three) times daily. Taper down    . omeprazole (PRILOSEC) 20 MG capsule TAKE 1 CAPSULE EVERY DAY (NEED MD APPOINTMENT) 90 capsule 0  . potassium chloride SA (K-DUR,KLOR-CON) 20 MEQ tablet Take 1 tablet twice daily for 3 days, then take 1 tablet by mouth daily. 30 tablet 1  . pravastatin (PRAVACHOL) 20 MG tablet Take 1 tablet (20 mg total) by mouth daily. Annual appt is due must see provider for future refills 90 tablet 0  . prednisoLONE acetate (PRED FORTE) 1 %  ophthalmic suspension Place 1 drop into both eyes 3 (three) times daily.     Marland Kitchen Propylhexedrine (BENZEDREX) INHA Place 1 each into the nose daily as needed (congestion).    . Vitamin D, Ergocalciferol, (DRISDOL) 50000 units CAPS capsule Take 1 capsule (50,000 Units total) by mouth every 7 (seven) days. 12 capsule 0   No facility-administered medications prior to  visit.   Past Medical History:  Diagnosis Date  . AKI (acute kidney injury) ([place]) 06/21/2015  . ALLERGIC RHINITIS 09/12/2007  . Arthritis   . BACK PAIN 06/19/2008  . Cancer ([place])    dx. 4'16 -oral chemotherapy only.  . Cramp of limb 08/11/2009  . DEEP VENOUS THROMBOPHLEBITIS, LEG, RIGHT 03/04/2010   right leg  . Diverticulosis   . FREQUENCY, URINARY 10/07/2009  . GERD 12/05/2006  . HEMORRHOIDS 11/17/2006  . HIP PAIN, RIGHT, CHRONIC 08/11/2009  . HX, PERSONAL, TUBERCULOSIS 11/17/2006  . HYPERLIPIDEMIA 09/12/2007  . Hyperlipidemia 09/12/2007   Qualifier: Diagnosis of  By: Jenny Reichmann MD, Hunt Oris   . HYPERTENSION 11/17/2006  . INSOMNIA-SLEEP DISORDER-UNSPEC 06/19/2008  . Iron deficiency anemia 12/07/2011  . LEG PAIN, RIGHT 06/19/2008  . Long term (current) use of anticoagulants 11/29/2010  . MASS, SUPERFICIAL 09/12/2007  . MENOPAUSAL DISORDER 09/20/2009  . OVERACTIVE BLADDER 03/04/2010  . Perforation of colon ([place]) 11/17/2015  . Pneumonia   . PULMONARY EMBOLISM, HX OF 11/17/2006   [place] [facility] s/p Pneumonia  . RECTAL BLEEDING 10/07/2007  . Rectal mass 06/2014  . SCIATICA, RIGHT 09/12/2007  . Shortness of breath dyspnea    With exertion since chemotherapy tx-oral meds  . SKIN LESION 07/20/2009  . TMJ SYNDROME 11/17/2006  . Tuberculosis    pt. was tx.  . Tubular adenoma of colon 2016   Past Surgical History:  Procedure Laterality Date  . ABDOMINAL HYSTERECTOMY    . CHOLECYSTECTOMY OPEN    . EUS N/A 08/06/2014   Procedure: LOWER ENDOSCOPIC ULTRASOUND (EUS);  Surgeon: Milus Banister, MD;  Location: [facility];  Service: Endoscopy;  Laterality: N/A;  . EXCISION MASS ABDOMINAL N/A 01/28/2016   Procedure: EXCISION  ABDOMINAL WALL MASS, CYST, QUESTION FAT NECROSIS, QUESTION SEROMA;  Surgeon: Michael Boston, MD;  Location: [facility];  Service: General;  Laterality: N/A;  . hemorroid surgery  removed at dr Silvio Pate office   x 2  . ILEOSTOMY CLOSURE N/A 01/28/2016   Procedure: TAKEDOWN OF LOOP ILEOSTOMY;   Surgeon: Michael Boston, MD;  Location: [facility];  Service: General;  Laterality: N/A;  . [place]  2002     multiple ventral hernia repair/mesh  . INCISIONAL HERNIA REPAIR  01/13/2005   open w onlay Proceed mesh.  Marland Kitchen LAPAROSCOPIC LYSIS OF ADHESIONS  06/04/2015   Procedure: LAPAROSCOPIC LYSIS OF ADHESIONS;  Surgeon: Michael Boston, MD;  Location: [facility];  Service: General;;  . PROCTOSCOPY N/A 01/28/2016   Procedure: RIGID PROCTOSCOPY, DILATION OF COLORECTAL ANASTOMOTIC STRICTURE;  Surgeon: Michael Boston, MD;  Location: [facility];  Service: General;  Laterality: N/A;  . s/p right hip replaced min invasive hip surgury [facility] oct 2011 Right 2011  . tumor removed off of right shoulder Right   . XI ROBOTIC ASSISTED LOWER ANTERIOR RESECTION N/A 06/04/2015   Procedure: XI ROBOTIC ASSISTED LYSIS OF ADHESIONS, LOWER ANTERIOR RESECTION TRANS ABDOMINAL AND TRANS ANAL, COLOANAL HANDSEWN ANASTAOSIS, DIVERTING LOPE ILIOSTOMY, RESECTION GIST TUMOR;  Surgeon: Michael Boston, MD;  Location: [facility];  Service: General;  Laterality: N/A;   Social History  Socioeconomic History  . Marital status: Married    Spouse name: Not on file  . Number of children: 2  . Years of education: Not on file  . Highest education level: Not on file  Occupational History  . Occupation: retired  Tobacco Use  . Smoking status: Former Smoker    Packs/day: 0.00    Types: Cigarettes    Quit date: 06/09/1977    Years since quitting: 42.3  . Smokeless tobacco: Never Used  Substance and Sexual Activity  . Alcohol use: Yes    Alcohol/week: 0.0 standard drinks    Comment: rare  . Drug use: No    Comment: past marijuana  . Sexual activity: Not on file  Other Topics Concern  . Not on file  Social History Narrative   Husband civil Chief Financial Officer [place]   Has #2 children (one son at home with her)   Prior Environmental education officer service positions   Social Determinants of Health   Financial Resource Strain:   . Difficulty of  Paying Living Expenses:   Food Insecurity:   . Worried About Charity fundraiser in the Last Year:   . Arboriculturist in the Last Year:   Transportation Needs: No Transportation Needs  . Lack of Transportation (Medical): No  . Lack of Transportation (Non-Medical): No  Physical Activity:   . Days of Exercise per Week:   . Minutes of Exercise per Session:   Stress:   . Feeling of Stress :   Social Connections:   . Frequency of Communication with Friends and Family:   . Frequency of Social Gatherings with Friends and Family:   . Attends Religious Services:   . Active Member of Clubs or Organizations:   . Attends Archivist Meetings:   Marland Kitchen Marital Status:    Family History  Problem Relation Age of Onset  . Colon cancer Father   . Heart disease Mother   . Heart disease Sister   . Diabetes Sister      Review of Systems: Pertinent positive and negative review of systems were noted in the above HPI section. All other review of systems were otherwise negative.   Physical Exam: General: Well developed, well nourished, no acute distress Head: Normocephalic and atraumatic Eyes:  sclerae anicteric, EOMI Ears: Normal auditory acuity Mouth: Not examined, mask on during Covid-19 pandemic Neck: Supple, no masses or thyromegaly Lungs: Clear throughout to auscultation Heart: Regular rate and rhythm; no murmurs, rubs or bruits Abdomen: Soft, non tender and non distended. Ventral hernia right mid abdomen, not reducible. No masses, hepatosplenomegaly noted. Normal Bowel sounds Rectal: Deferred to colonoscopy Musculoskeletal: Symmetrical with no gross deformities  Skin: No lesions on visible extremities Pulses:  Normal pulses noted Extremities: No clubbing, cyanosis, edema or deformities noted Neurological: Alert oriented x 4, grossly nonfocal Cervical Nodes:  No significant cervical adenopathy Inguinal Nodes: No significant inguinal adenopathy Psychological:  Alert and  cooperative. Normal mood and affect   Assessment and Recommendations:  1.  Rectal/pelvic GIST status post low anterior resection in 2017 with positive margins currently treated with Gleevec. Progressive disease on most recent CT AP. Personal history of adenomatous colon polyps.  Intermittent mild diarrhea controlled with Imodium.  Intermittent small-volume hematochezia.  Schedule colonoscopy. The risks (including bleeding, perforation, infection, missed lesions, medication reactions and possible hospitalization or surgery if complications occur), benefits, and alternatives to colonoscopy with possible biopsy and possible polypectomy were discussed with the patient and they consent to proceed.  2. Right sided ventral hernia, not reducible.   3. History DVT, PE.  Hold Eliquis 2 days before procedure - will instruct when and how to resume after procedure. Low but real risk of cardiovascular event such as heart attack, stroke, embolism, thrombosis or ischemia/infarct of other organs off Eliquis explained and need to seek urgent help if this occurs. The patient consents to proceed. Will communicate by phone or EMR with patient's prescribing provider to confirm that holding Eliquis is reasonable in this case.     cc: Biagio Borg, MD [address] [place],  [place] [address]

## 2019-09-24 NOTE — Telephone Encounter (Signed)
   Brianna Walker 13-May-1945 [id number]  Dear Dr. Jenny Reichmann:  We have scheduled the above named patient for a(n) Colonoscopy procedure. Our records show that (s)he is on anticoagulation therapy.  Please advise as to whether the patient may come off their therapy of Eliquis 2 days prior to their procedure which is scheduled for 10/29/19.  Please route your response to Dorisann Frames, CMA or fax response to [phone].  Sincerely,    [facility]

## 2019-09-25 NOTE — Telephone Encounter (Signed)
Called patient with no answer and voicemail was full and could not leave a message.

## 2019-09-26 NOTE — Telephone Encounter (Signed)
Informed patient per Dr. Jenny Reichmann to hold Eliquis 2 days prior to her Colonoscopy. Patient verbalized understanding. Patient states she would also like to reschedule her Colonoscopy. Rescheduled procedure for 11/17/19 at 3pm. Informed patient I will send her new paperwork through my chart and call if she has any questions. Patient verbalized understanding.

## 2019-09-30 ENCOUNTER — Telehealth: Payer: Self-pay

## 2019-09-30 ENCOUNTER — Other Ambulatory Visit: Payer: Self-pay | Admitting: Nurse Practitioner

## 2019-09-30 ENCOUNTER — Inpatient Hospital Stay: Payer: Medicare HMO

## 2019-09-30 ENCOUNTER — Encounter: Payer: Self-pay | Admitting: Nurse Practitioner

## 2019-09-30 ENCOUNTER — Other Ambulatory Visit: Payer: Self-pay

## 2019-09-30 ENCOUNTER — Inpatient Hospital Stay: Payer: Medicare HMO | Attending: Oncology | Admitting: Nurse Practitioner

## 2019-09-30 VITALS — BP 159/96 | HR 103 | Temp 97.9°F | Resp 17 | Ht 64.0 in | Wt 196.3 lb

## 2019-09-30 DIAGNOSIS — Z862 Personal history of diseases of the blood and blood-forming organs and certain disorders involving the immune mechanism: Secondary | ICD-10-CM | POA: Diagnosis not present

## 2019-09-30 DIAGNOSIS — D649 Anemia, unspecified: Secondary | ICD-10-CM | POA: Diagnosis not present

## 2019-09-30 DIAGNOSIS — C49A4 Gastrointestinal stromal tumor of large intestine: Secondary | ICD-10-CM | POA: Diagnosis not present

## 2019-09-30 DIAGNOSIS — Z7901 Long term (current) use of anticoagulants: Secondary | ICD-10-CM | POA: Diagnosis not present

## 2019-09-30 DIAGNOSIS — C49A5 Gastrointestinal stromal tumor of rectum: Secondary | ICD-10-CM | POA: Diagnosis not present

## 2019-09-30 DIAGNOSIS — Z86711 Personal history of pulmonary embolism: Secondary | ICD-10-CM | POA: Diagnosis not present

## 2019-09-30 DIAGNOSIS — Z86010 Personal history of colonic polyps: Secondary | ICD-10-CM | POA: Insufficient documentation

## 2019-09-30 LAB — CBC WITH DIFFERENTIAL (CANCER CENTER ONLY)
Abs Immature Granulocytes: 0.01 10*3/uL (ref 0.00–0.07)
Basophils Absolute: 0 10*3/uL (ref 0.0–0.1)
Basophils Relative: 1 %
Eosinophils Absolute: 0.1 10*3/uL (ref 0.0–0.5)
Eosinophils Relative: 1 %
HCT: 37.3 % (ref 36.0–46.0)
Hemoglobin: 12.4 g/dL (ref 12.0–15.0)
Immature Granulocytes: 0 %
Lymphocytes Relative: 22 %
Lymphs Abs: 1.7 10*3/uL (ref 0.7–4.0)
MCH: 28.2 pg (ref 26.0–34.0)
MCHC: 33.2 g/dL (ref 30.0–36.0)
MCV: 84.8 fL (ref 80.0–100.0)
Monocytes Absolute: 0.4 10*3/uL (ref 0.1–1.0)
Monocytes Relative: 5 %
Neutro Abs: 5.2 10*3/uL (ref 1.7–7.7)
Neutrophils Relative %: 71 %
Platelet Count: 308 10*3/uL (ref 150–400)
RBC: 4.4 MIL/uL (ref 3.87–5.11)
RDW: 15 % (ref 11.5–15.5)
WBC Count: 7.4 10*3/uL (ref 4.0–10.5)
nRBC: 0 % (ref 0.0–0.2)

## 2019-09-30 LAB — CMP (CANCER CENTER ONLY)
ALT: 9 U/L (ref 0–44)
AST: 13 U/L — ABNORMAL LOW (ref 15–41)
Albumin: 3.3 g/dL — ABNORMAL LOW (ref 3.5–5.0)
Alkaline Phosphatase: 119 U/L (ref 38–126)
Anion gap: 11 (ref 5–15)
BUN: 13 mg/dL (ref 8–23)
CO2: 21 mmol/L — ABNORMAL LOW (ref 22–32)
Calcium: 8.6 mg/dL — ABNORMAL LOW (ref 8.9–10.3)
Chloride: 110 mmol/L (ref 98–111)
Creatinine: 1.18 mg/dL — ABNORMAL HIGH (ref 0.44–1.00)
GFR, Est AFR Am: 54 mL/min — ABNORMAL LOW (ref >60)
GFR, Estimated: 46 mL/min — ABNORMAL LOW (ref >60)
Glucose, Bld: 110 mg/dL — ABNORMAL HIGH (ref 70–99)
Potassium: 3.3 mmol/L — ABNORMAL LOW (ref 3.5–5.1)
Sodium: 142 mmol/L (ref 135–145)
Total Bilirubin: 0.8 mg/dL (ref 0.3–1.2)
Total Protein: 7.6 g/dL (ref 6.5–8.1)

## 2019-09-30 MED ORDER — POTASSIUM CHLORIDE CRYS ER 20 MEQ PO TBCR: 20.0000 meq | EXTENDED_RELEASE_TABLET | Freq: Every day | ORAL | 1 refills | Status: DC

## 2019-09-30 NOTE — Telephone Encounter (Signed)
TC to pt per Brianna Walker to let her know that her potassium is mildly low likely due to diarrhea. I let her know that Lattie Haw is sending a prescription to her pharmacy for K-Dur 20 mEq daily. Patient verbalized understanding.

## 2019-09-30 NOTE — Progress Notes (Signed)
[place] OFFICE PROGRESS NOTE   Diagnosis: Gastrointestinal stromal tumor  INTERVAL HISTORY:   Brianna Walker returns as scheduled.  Gleevec dose was escalated to 400 mg alternating with 300 mg beginning 09/16/2019.  She reports she has not tolerated a 400 mg dose well.  She has 6-7 loose stools after each 400 mg dose.  No nausea or vomiting though she does note an alteration in taste.  No rash.  No leg swelling.  Objective:  Vital signs in last 24 hours:  Blood pressure (!) 159/96, pulse (!) 103, temperature 97.9 F (36.6 C), temperature source Temporal, resp. rate 17, height 5\' 4"  (1.626 m), weight 196 lb 4.8 oz (89 kg), SpO2 99 %.    HEENT: No thrush or ulcers. Resp: Lungs clear bilaterally. Cardio: Regular rate and rhythm. GI: Abdomen soft and nontender.  No hepatomegaly.  No mass. Vascular: No leg edema.  Skin: No rash.   Lab Results:  Lab Results  Component Value Date   WBC 7.4 09/30/2019   HGB 12.4 09/30/2019   HCT 37.3 09/30/2019   MCV 84.8 09/30/2019   PLT 308 09/30/2019   NEUTROABS 5.2 09/30/2019    Imaging:  No results found.  Medications: I have reviewed the patient's current medications.  Assessment/Plan: 1.Gastrointestinal stromal tumor of the rectum  Status post an endoscopic ultrasound on 08/06/2014 confirming a mass abutting the distal rectal wall with an FNA biopsy confirming a gastrointestinal stromal tumor  Initiation of Gleevec 09/04/2014  MRI pelvis 12/15/2014 with interval decreased size of the large anorectal mass with central necrosis.  Continuation of Gleevec  Gleevec placed on hold 06/02/2015 in anticipation of surgery  06/04/2015 status post low anterior resection and diverting loop ileostomy, resection of tumor  Pathology: 9.5 cm tumor; GISTsubtype spindle; mitotic rate 1/50 high-power field; positive margin of resection  Adjuvant Gleevec 08/19/2015 (three-year in total course planned)  Ileostomy takedown  01/28/2016  Held 01/28/2016 through 02/14/2016, resumed 02/15/2016  Gleevec stop 04/11/2016-04/18/2016, and 04/26/2016-05/08/2016  Gleevec resumed at a dose of 200 mg daily on 05/09/2016  Gleevec dose increased to 300 mg daily beginning 06/20/2016  Gleevec dose decreased to 200 mg daily beginning 06/23/2016 (poor tolerance at 300 mg dose)  CTs 11/22/2017-enhancing soft tissue mass in the presacral space measuring 5.4 x 3.5 cm. Enlarged pelvic lymph nodes.  Gleevec continued at a dose of 200 mg daily  Restaging CTs 03/11/2018-interval decrease in size of the presacral soft tissue mass. Stable small pelvic lymph nodes.  Gleevec discontinued 03/13/2018  CT 11/14/2018-new 2.4 cm left pelvic nodule at the piriformis muscle, stable presacral lymph nodes, right abdominal wall hernia, ventral hernia  Gleevec 200 mg daily 12/04/2018  CTs11/30/2020-decrease in size of soft tissue nodule deep left pelvis. 5 nodules in the sigmoid mesocolon stable to slightly larger. Stable presacral soft tissue thickening. No findings for new metastatic disease.  CT abdomen/pelvis 09/01/2019-progressive presacral and left perirectal nodularity. Additional mesenteric nodules along the sigmoid mesocolon and left pelvis unchanged.  Gleevec dose escalated to 300 mg daily 09/02/2019  Gleevec dose escalated to 400 mg alternating with 300 mg beginning 09/16/2019  Gleevec 300 mg daily beginning 09/30/2019 (unable to tolerate 400 mg due to diarrhea) 2. Remote history of pulmonary embolism-maintained on apixaban 3. Multiple colon polyps noted on the colonoscopy 07/21/2014 with the pathology revealing tubular adenomas 4. Hospitalization 06/21/2015 through 07/06/2015 with nausea/vomiting, high ileostomy output; found to have delayed gastric emptying. 5. Water soluble contrast enema 08/13/2015 positive for a small to moderate volume  of water soluble contrast leakage from the low anterior resection and anastomosis site in  the pelvis 6. Ileostomy takedown and resection of cystic abdominal wall mass ( benign pathology) on 01/28/2016 7. Anemia with ferritin in the low normal range 05/23/2016 and 06/20/2016. Ferrous sulfate increased to twice daily 06/26/2016.Improved.    Disposition: Brianna Walker appears stable.  Following her last visit [name] was escalated to 400 mg alternating with 300 mg.  She is unable to tolerate the 400 mg dose due to diarrhea.  She will continue Gleevec 300 mg daily.  She will return for lab and follow-up in approximately 3 weeks.  She will contact the office in the interim with any problems.    Ned Card ANP/GNP-BC   09/30/2019  3:07 PM

## 2019-10-15 ENCOUNTER — Other Ambulatory Visit: Payer: Self-pay | Admitting: *Deleted

## 2019-10-15 MED ORDER — IMATINIB MESYLATE 100 MG PO TABS: 300.0000 mg | ORAL_TABLET | Freq: Every day | ORAL | 1 refills | Status: DC

## 2019-10-16 MED FILL — IMATINIB MESYLATE 100 MG TA: 100 | 30 days supply | Qty: 90 | Fill #0

## 2019-10-17 ENCOUNTER — Other Ambulatory Visit: Payer: Self-pay | Admitting: *Deleted

## 2019-10-17 DIAGNOSIS — C49A4 Gastrointestinal stromal tumor of large intestine: Secondary | ICD-10-CM

## 2019-10-17 MED ORDER — POTASSIUM CHLORIDE CRYS ER 20 MEQ PO TBCR: 20.0000 meq | EXTENDED_RELEASE_TABLET | Freq: Every day | ORAL | 0 refills | Status: DC

## 2019-10-20 ENCOUNTER — Inpatient Hospital Stay (HOSPITAL_BASED_OUTPATIENT_CLINIC_OR_DEPARTMENT_OTHER): Payer: Medicare HMO | Admitting: Oncology

## 2019-10-20 ENCOUNTER — Other Ambulatory Visit: Payer: Self-pay | Admitting: Oncology

## 2019-10-20 ENCOUNTER — Other Ambulatory Visit: Payer: Self-pay

## 2019-10-20 ENCOUNTER — Inpatient Hospital Stay: Payer: Medicare HMO

## 2019-10-20 VITALS — BP 146/95 | HR 99 | Temp 97.5°F | Resp 16 | Ht 64.0 in | Wt 191.8 lb

## 2019-10-20 DIAGNOSIS — C49A4 Gastrointestinal stromal tumor of large intestine: Secondary | ICD-10-CM

## 2019-10-20 DIAGNOSIS — Z86010 Personal history of colonic polyps: Secondary | ICD-10-CM | POA: Diagnosis not present

## 2019-10-20 DIAGNOSIS — Z862 Personal history of diseases of the blood and blood-forming organs and certain disorders involving the immune mechanism: Secondary | ICD-10-CM | POA: Diagnosis not present

## 2019-10-20 DIAGNOSIS — Z7901 Long term (current) use of anticoagulants: Secondary | ICD-10-CM | POA: Diagnosis not present

## 2019-10-20 DIAGNOSIS — D649 Anemia, unspecified: Secondary | ICD-10-CM | POA: Diagnosis not present

## 2019-10-20 DIAGNOSIS — C49A5 Gastrointestinal stromal tumor of rectum: Secondary | ICD-10-CM | POA: Diagnosis not present

## 2019-10-20 DIAGNOSIS — Z86711 Personal history of pulmonary embolism: Secondary | ICD-10-CM | POA: Diagnosis not present

## 2019-10-20 LAB — CMP (CANCER CENTER ONLY)
ALT: 10 U/L (ref 0–44)
AST: 11 U/L — ABNORMAL LOW (ref 15–41)
Albumin: 3.4 g/dL — ABNORMAL LOW (ref 3.5–5.0)
Alkaline Phosphatase: 127 U/L — ABNORMAL HIGH (ref 38–126)
Anion gap: 11 (ref 5–15)
BUN: 17 mg/dL (ref 8–23)
CO2: 23 mmol/L (ref 22–32)
Calcium: 9.1 mg/dL (ref 8.9–10.3)
Chloride: 110 mmol/L (ref 98–111)
Creatinine: 1.38 mg/dL — ABNORMAL HIGH (ref 0.44–1.00)
GFR, Est AFR Am: 44 mL/min — ABNORMAL LOW (ref >60)
GFR, Estimated: 38 mL/min — ABNORMAL LOW (ref >60)
Glucose, Bld: 91 mg/dL (ref 70–99)
Potassium: 4.3 mmol/L (ref 3.5–5.1)
Sodium: 144 mmol/L (ref 135–145)
Total Bilirubin: 0.9 mg/dL (ref 0.3–1.2)
Total Protein: 8 g/dL (ref 6.5–8.1)

## 2019-10-20 LAB — CBC WITH DIFFERENTIAL (CANCER CENTER ONLY)
Abs Immature Granulocytes: 0.02 10*3/uL (ref 0.00–0.07)
Basophils Absolute: 0 10*3/uL (ref 0.0–0.1)
Basophils Relative: 1 %
Eosinophils Absolute: 0.1 10*3/uL (ref 0.0–0.5)
Eosinophils Relative: 1 %
HCT: 38 % (ref 36.0–46.0)
Hemoglobin: 12.5 g/dL (ref 12.0–15.0)
Immature Granulocytes: 0 %
Lymphocytes Relative: 21 %
Lymphs Abs: 1.5 10*3/uL (ref 0.7–4.0)
MCH: 28.3 pg (ref 26.0–34.0)
MCHC: 32.9 g/dL (ref 30.0–36.0)
MCV: 86.2 fL (ref 80.0–100.0)
Monocytes Absolute: 0.5 10*3/uL (ref 0.1–1.0)
Monocytes Relative: 7 %
Neutro Abs: 5 10*3/uL (ref 1.7–7.7)
Neutrophils Relative %: 70 %
Platelet Count: 310 10*3/uL (ref 150–400)
RBC: 4.41 MIL/uL (ref 3.87–5.11)
RDW: 15.2 % (ref 11.5–15.5)
WBC Count: 7.1 10*3/uL (ref 4.0–10.5)
nRBC: 0 % (ref 0.0–0.2)

## 2019-10-20 LAB — MAGNESIUM: Magnesium: 1.7 mg/dL (ref 1.7–2.4)

## 2019-10-20 MED ORDER — DIPHENOXYLATE-ATROPINE 2.5-0.025 MG PO TABS: 1.0000 | ORAL_TABLET | Freq: Four times a day (QID) | ORAL | 0 refills | Status: DC | PRN

## 2019-10-20 MED FILL — DIPHENOXYLATE-ATROPINE 2.5-: 2.5-0.025 | 7 days supply | Qty: 60 | Fill #0

## 2019-10-20 NOTE — Progress Notes (Signed)
[place] OFFICE PROGRESS NOTE   Diagnosis: Gastrointestinal stromal tumor  INTERVAL HISTORY:   Brianna Walker returns as scheduled.  She continues Gleevec at a dose of 300 mg daily.  She reports multiple soft stools per day.  No diarrhea.  She takes Imodium but continues to have greater than 5 bowel movements daily.  She has intermittent "cramping "in the left hand and ankles.  Good appetite and energy level.  No pelvic pain.  Objective:  Vital signs in last 24 hours:  Blood pressure (!) 146/95, pulse 99, temperature (!) 97.5 F (36.4 C), temperature source Temporal, resp. rate 16, height 5\' 4"  (1.626 m), weight 191 lb 12.8 oz (87 kg), SpO2 99 %.    HEENT: No thrush or ulcers Resp: Lungs with scattered end inspiratory rhonchi, no respiratory distress Cardio: Regular rate and rhythm GI: No hepatosplenomegaly, right abdomen hernia, nontender Vascular: No leg edema  Skin: No rash  Portacath/PICC-without erythema  Lab Results:  Lab Results  Component Value Date   WBC 7.4 09/30/2019   HGB 12.4 09/30/2019   HCT 37.3 09/30/2019   MCV 84.8 09/30/2019   PLT 308 09/30/2019   NEUTROABS 5.2 09/30/2019    CMP  Lab Results  Component Value Date   NA 142 09/30/2019   K 3.3 (L) 09/30/2019   CL 110 09/30/2019   CO2 21 (L) 09/30/2019   GLUCOSE 110 (H) 09/30/2019   BUN 13 09/30/2019   CREATININE 1.18 (H) 09/30/2019   CALCIUM 8.6 (L) 09/30/2019   PROT 7.6 09/30/2019   ALBUMIN 3.3 (L) 09/30/2019   AST 13 (L) 09/30/2019   ALT 9 09/30/2019   ALKPHOS 119 09/30/2019   BILITOT 0.8 09/30/2019   GFRNONAA 46 (L) 09/30/2019   GFRAA 54 (L) 09/30/2019     Medications: I have reviewed the patient's current medications.   Assessment/Plan: 1.Gastrointestinal stromal tumor of the rectum  Status post an endoscopic ultrasound on 08/06/2014 confirming a mass abutting the distal rectal wall with an FNA biopsy confirming a gastrointestinal stromal tumor  Initiation of  Gleevec 09/04/2014  MRI pelvis 12/15/2014 with interval decreased size of the large anorectal mass with central necrosis.  Continuation of Gleevec  Gleevec placed on hold 06/02/2015 in anticipation of surgery  06/04/2015 status post low anterior resection and diverting loop ileostomy, resection of tumor  Pathology: 9.5 cm tumor; GISTsubtype spindle; mitotic rate 1/50 high-power field; positive margin of resection  Adjuvant Gleevec 08/19/2015 (three-year in total course planned)  Ileostomy takedown 01/28/2016  Held 01/28/2016 through 02/14/2016, resumed 02/15/2016  Gleevec stop 04/11/2016-04/18/2016, and 04/26/2016-05/08/2016  Gleevec resumed at a dose of 200 mg daily on 05/09/2016  Gleevec dose increased to 300 mg daily beginning 06/20/2016  Gleevec dose decreased to 200 mg daily beginning 06/23/2016 (poor tolerance at 300 mg dose)  CTs 11/22/2017-enhancing soft tissue mass in the presacral space measuring 5.4 x 3.5 cm. Enlarged pelvic lymph nodes.  Gleevec continued at a dose of 200 mg daily  Restaging CTs 03/11/2018-interval decrease in size of the presacral soft tissue mass. Stable small pelvic lymph nodes.  Gleevec discontinued 03/13/2018  CT 11/14/2018-new 2.4 cm left pelvic nodule at the piriformis muscle, stable presacral lymph nodes, right abdominal wall hernia, ventral hernia  Gleevec 200 mg daily 12/04/2018  CTs11/30/2020-decrease in size of soft tissue nodule deep left pelvis. 5 nodules in the sigmoid mesocolon stable to slightly larger. Stable presacral soft tissue thickening. No findings for new metastatic disease.  CT abdomen/pelvis 09/01/2019-progressive presacral and left perirectal nodularity.  Additional mesenteric nodules along the sigmoid mesocolon and left pelvis unchanged.  Gleevec dose escalated to 300 mg daily 09/02/2019  Gleevec dose escalated to 400 mg alternating with 300 mg beginning 09/16/2019  Gleevec 300 mg daily beginning 09/30/2019 (unable  to tolerate 400 mg due to diarrhea) 2. Remote history of pulmonary embolism-maintained on apixaban 3. Multiple colon polyps noted on the colonoscopy 07/21/2014 with the pathology revealing tubular adenomas 4. Hospitalization 06/21/2015 through 07/06/2015 with nausea/vomiting, high ileostomy output; found to have delayed gastric emptying. 5. Water soluble contrast enema 08/13/2015 positive for a small to moderate volume of water soluble contrast leakage from the low anterior resection and anastomosis site in the pelvis 6. Ileostomy takedown and resection of cystic abdominal wall mass ( benign pathology) on 01/28/2016 7. Anemia with ferritin in the low normal range 05/23/2016 and 06/20/2016. Ferrous sulfate increased to twice daily 06/26/2016.Improved.    Disposition: Brianna Walker appears unchanged.  She continues Gleevec at a dose of 300 mg daily.  We added Lomotil for the frequent bowel movements.  We will follow up on the chemistry panel from today including a potassium and magnesium level.  Ms. Shaft will be scheduled for an office visit and restaging CT in approximately 6 weeks.  Betsy Coder, MD  10/20/2019  11:21 AM

## 2019-10-20 NOTE — Progress Notes (Deleted)
[place] OFFICE PROGRESS NOTE   Diagnosis:   INTERVAL HISTORY:   ***  Objective:  Vital signs in last 24 hours:  Blood pressure (!) 146/95, pulse 99, temperature (!) 97.5 F (36.4 C), temperature source Temporal, resp. rate 16, height 5\' 4"  (1.626 m), weight 191 lb 12.8 oz (87 kg), SpO2 99 %.    HEENT: *** Lymphatics: *** Resp: *** Cardio: *** GI: *** Vascular: *** Neuro:***  Skin:***   Portacath/PICC-without erythema  Lab Results:  Lab Results  Component Value Date   WBC 7.4 09/30/2019   HGB 12.4 09/30/2019   HCT 37.3 09/30/2019   MCV 84.8 09/30/2019   PLT 308 09/30/2019   NEUTROABS 5.2 09/30/2019    CMP  Lab Results  Component Value Date   NA 142 09/30/2019   K 3.3 (L) 09/30/2019   CL 110 09/30/2019   CO2 21 (L) 09/30/2019   GLUCOSE 110 (H) 09/30/2019   BUN 13 09/30/2019   CREATININE 1.18 (H) 09/30/2019   CALCIUM 8.6 (L) 09/30/2019   PROT 7.6 09/30/2019   ALBUMIN 3.3 (L) 09/30/2019   AST 13 (L) 09/30/2019   ALT 9 09/30/2019   ALKPHOS 119 09/30/2019   BILITOT 0.8 09/30/2019   GFRNONAA 46 (L) 09/30/2019   GFRAA 54 (L) 09/30/2019    No results found for: CEA1  Lab Results  Component Value Date   INR 0.94 01/26/2016    Imaging:  No results found.  Medications: I have reviewed the patient's current medications.   Assessment/Plan: Gastrointestinal stromal tumor of the rectum  Status post an endoscopic ultrasound on 08/06/2014 confirming a mass abutting the distal rectal wall with an FNA biopsy confirming a gastrointestinal stromal tumor  Initiation of Gleevec 09/04/2014  MRI pelvis 12/15/2014 with interval decreased size of the large anorectal mass with central necrosis.  Continuation of Gleevec  Gleevec placed on hold 06/02/2015 in anticipation of surgery  06/04/2015 status post low anterior resection and diverting loop ileostomy, resection of tumor  Pathology: 9.5 cm tumor; GISTsubtype spindle; mitotic rate  1/50 high-power field; positive margin of resection  Adjuvant Gleevec 08/19/2015 (three-year in total course planned)  Ileostomy takedown 01/28/2016  Held 01/28/2016 through 02/14/2016, resumed 02/15/2016  Gleevec stop 04/11/2016-04/18/2016, and 04/26/2016-05/08/2016  Gleevec resumed at a dose of 200 mg daily on 05/09/2016  Gleevec dose increased to 300 mg daily beginning 06/20/2016  Gleevec dose decreased to 200 mg daily beginning 06/23/2016 (poor tolerance at 300 mg dose)  CTs 11/22/2017-enhancing soft tissue mass in the presacral space measuring 5.4 x 3.5 cm. Enlarged pelvic lymph nodes.  Gleevec continued at a dose of 200 mg daily  Restaging CTs 03/11/2018-interval decrease in size of the presacral soft tissue mass. Stable small pelvic lymph nodes.  Gleevec discontinued 03/13/2018  CT 11/14/2018-new 2.4 cm left pelvic nodule at the piriformis muscle, stable presacral lymph nodes, right abdominal wall hernia, ventral hernia  Gleevec 200 mg daily 12/04/2018  CTs11/30/2020-decrease in size of soft tissue nodule deep left pelvis. 5 nodules in the sigmoid mesocolon stable to slightly larger. Stable presacral soft tissue thickening. No findings for new metastatic disease.  CT abdomen/pelvis 09/01/2019-progressive presacral and left perirectal nodularity. Additional mesenteric nodules along the sigmoid mesocolon and left pelvis unchanged.  Gleevec dose escalated to 300 mg daily 09/02/2019  Gleevec dose escalated to 400 mg alternating with 300 mg beginning 09/16/2019  Gleevec 300 mg daily beginning 09/30/2019 (unable to tolerate 400 mg due to diarrhea) 2. Remote history of pulmonary embolism-maintained on apixaban 3.  Multiple colon polyps noted on the colonoscopy 07/21/2014 with the pathology revealing tubular adenomas 4. Hospitalization 06/21/2015 through 07/06/2015 with nausea/vomiting, high ileostomy output; found to have delayed gastric emptying. 5. Water soluble contrast  enema 08/13/2015 positive for a small to moderate volume of water soluble contrast leakage from the low anterior resection and anastomosis site in the pelvis 6. Ileostomy takedown and resection of cystic abdominal wall mass ( benign pathology) on 01/28/2016 7. Anemia with ferritin in the low normal range 05/23/2016 and 06/20/2016. Ferrous sulfate increased to twice daily 06/26/2016.Improved.    Disposition: ***  Betsy Coder, MD  10/20/2019  11:23 AM

## 2019-10-21 ENCOUNTER — Telehealth: Payer: Self-pay | Admitting: Oncology

## 2019-10-21 NOTE — Telephone Encounter (Signed)
Scheduled appts per 6/21 los. Pt confirmed appt date and time.

## 2019-10-27 ENCOUNTER — Other Ambulatory Visit: Payer: Self-pay | Admitting: Internal Medicine

## 2019-10-27 DIAGNOSIS — Z1231 Encounter for screening mammogram for malignant neoplasm of breast: Secondary | ICD-10-CM

## 2019-10-27 NOTE — Telephone Encounter (Signed)
I am not clear from the chart why she needs to continue the eliquis blood thinner  Please ask pt the reason, and if this was meant to be a long term medication

## 2019-10-29 ENCOUNTER — Encounter: Payer: Medicare Other | Admitting: Gastroenterology

## 2019-11-13 ENCOUNTER — Other Ambulatory Visit: Payer: Self-pay | Admitting: Oncology

## 2019-11-17 ENCOUNTER — Encounter: Payer: Medicare Other | Admitting: Gastroenterology

## 2019-11-17 MED FILL — IMATINIB MESYLATE 100 MG TA: 100 | 30 days supply | Qty: 90 | Fill #1

## 2019-11-18 ENCOUNTER — Encounter: Payer: Self-pay | Admitting: Gastroenterology

## 2019-11-26 ENCOUNTER — Encounter: Payer: Self-pay | Admitting: *Deleted

## 2019-11-26 NOTE — Progress Notes (Signed)
Received fax from [facility] that Nystatin powder requires a PA and new script. Forwarded information to [place], Therapist, sports to process. Patient did fill 1st script at local pharmacy on 11/21/19.

## 2019-11-28 ENCOUNTER — Other Ambulatory Visit: Payer: Self-pay

## 2019-12-01 ENCOUNTER — Other Ambulatory Visit: Payer: Self-pay

## 2019-12-01 ENCOUNTER — Ambulatory Visit
Admission: RE | Admit: 2019-12-01 | Discharge: 2019-12-01 | Disposition: A | Discharge status: Home / Self Care | Payer: Medicare HMO | Source: Ambulatory Visit | Attending: Internal Medicine | Admitting: Internal Medicine

## 2019-12-01 ENCOUNTER — Ambulatory Visit: Payer: Medicare HMO

## 2019-12-01 DIAGNOSIS — Z1231 Encounter for screening mammogram for malignant neoplasm of breast: Secondary | ICD-10-CM

## 2019-12-05 ENCOUNTER — Telehealth: Payer: Self-pay | Admitting: *Deleted

## 2019-12-05 ENCOUNTER — Telehealth: Payer: Self-pay

## 2019-12-05 ENCOUNTER — Other Ambulatory Visit: Payer: Self-pay

## 2019-12-05 ENCOUNTER — Inpatient Hospital Stay: Payer: Medicare HMO | Attending: Oncology

## 2019-12-05 DIAGNOSIS — Z86711 Personal history of pulmonary embolism: Secondary | ICD-10-CM | POA: Diagnosis not present

## 2019-12-05 DIAGNOSIS — C49A5 Gastrointestinal stromal tumor of rectum: Secondary | ICD-10-CM | POA: Diagnosis not present

## 2019-12-05 DIAGNOSIS — C49A4 Gastrointestinal stromal tumor of large intestine: Secondary | ICD-10-CM

## 2019-12-05 DIAGNOSIS — D649 Anemia, unspecified: Secondary | ICD-10-CM | POA: Insufficient documentation

## 2019-12-05 DIAGNOSIS — Z862 Personal history of diseases of the blood and blood-forming organs and certain disorders involving the immune mechanism: Secondary | ICD-10-CM | POA: Diagnosis not present

## 2019-12-05 DIAGNOSIS — Z86010 Personal history of colonic polyps: Secondary | ICD-10-CM | POA: Insufficient documentation

## 2019-12-05 LAB — CMP (CANCER CENTER ONLY)
ALT: <6 U/L (ref 0–44)
AST: 11 U/L — ABNORMAL LOW (ref 15–41)
Albumin: 3.4 g/dL — ABNORMAL LOW (ref 3.5–5.0)
Alkaline Phosphatase: 137 U/L — ABNORMAL HIGH (ref 38–126)
Anion gap: 8 (ref 5–15)
BUN: 7 mg/dL — ABNORMAL LOW (ref 8–23)
CO2: 27 mmol/L (ref 22–32)
Calcium: 9.3 mg/dL (ref 8.9–10.3)
Chloride: 107 mmol/L (ref 98–111)
Creatinine: 1.07 mg/dL — ABNORMAL HIGH (ref 0.44–1.00)
GFR, Est AFR Am: >60 mL/min (ref >60)
GFR, Estimated: 52 mL/min — ABNORMAL LOW (ref >60)
Glucose, Bld: 95 mg/dL (ref 70–99)
Potassium: 3 mmol/L — CL (ref 3.5–5.1)
Sodium: 142 mmol/L (ref 135–145)
Total Bilirubin: 1.2 mg/dL (ref 0.3–1.2)
Total Protein: 7.9 g/dL (ref 6.5–8.1)

## 2019-12-05 LAB — CBC WITH DIFFERENTIAL (CANCER CENTER ONLY)
Abs Immature Granulocytes: 0.03 10*3/uL (ref 0.00–0.07)
Basophils Absolute: 0 10*3/uL (ref 0.0–0.1)
Basophils Relative: 1 %
Eosinophils Absolute: 0.1 10*3/uL (ref 0.0–0.5)
Eosinophils Relative: 1 %
HCT: 38.2 % (ref 36.0–46.0)
Hemoglobin: 12.6 g/dL (ref 12.0–15.0)
Immature Granulocytes: 0 %
Lymphocytes Relative: 20 %
Lymphs Abs: 1.4 10*3/uL (ref 0.7–4.0)
MCH: 28.4 pg (ref 26.0–34.0)
MCHC: 33 g/dL (ref 30.0–36.0)
MCV: 86 fL (ref 80.0–100.0)
Monocytes Absolute: 0.3 10*3/uL (ref 0.1–1.0)
Monocytes Relative: 5 %
Neutro Abs: 5.1 10*3/uL (ref 1.7–7.7)
Neutrophils Relative %: 73 %
Platelet Count: 315 10*3/uL (ref 150–400)
RBC: 4.44 MIL/uL (ref 3.87–5.11)
RDW: 14.1 % (ref 11.5–15.5)
WBC Count: 7 10*3/uL (ref 4.0–10.5)
nRBC: 0 % (ref 0.0–0.2)

## 2019-12-05 LAB — MAGNESIUM: Magnesium: 1.7 mg/dL (ref 1.7–2.4)

## 2019-12-05 MED ORDER — POTASSIUM CHLORIDE ER 10 MEQ PO CPCR: 20.0000 meq | ORAL_CAPSULE | Freq: Every day | ORAL | 2 refills | Status: DC

## 2019-12-05 NOTE — Telephone Encounter (Signed)
-----   Message from Brianna Pier, MD sent at 12/05/2019  1:16 PM EDT ----- Please call patient, potassium is low,increase kcl to 44meq tid, f/u as scheduled with repeat bmp

## 2019-12-05 NOTE — Telephone Encounter (Signed)
Potassium of 3.0 called from lab per blood draw this am.  Noted klor con on med list - called pt - not taking.  Discussed - and request will be made for prescription for micro K for better compliance.  Above reviewed with LT/NP- she will further advise

## 2019-12-05 NOTE — Telephone Encounter (Signed)
Completed this morning following her lab pt encouraged and agreed to continue potassium as ordered formulary changed to capsule so pt can open and sprinkle

## 2019-12-08 ENCOUNTER — Telehealth: Payer: Self-pay | Admitting: *Deleted

## 2019-12-08 MED ORDER — POTASSIUM CHLORIDE ER 10 MEQ PO TBCR: 20.0000 meq | EXTENDED_RELEASE_TABLET | Freq: Every day | ORAL | 0 refills | Status: DC

## 2019-12-08 NOTE — Telephone Encounter (Signed)
Called to request KCL be sent to [facility] until her mail order supply arrives. Script for 1 week supply sent.

## 2019-12-09 ENCOUNTER — Ambulatory Visit: Payer: Medicare HMO | Admitting: Oncology

## 2019-12-10 ENCOUNTER — Telehealth: Payer: Self-pay | Admitting: *Deleted

## 2019-12-10 NOTE — Telephone Encounter (Signed)
Provided appointment on 12/17/19 at 12:00. She agrees to this appointment.

## 2019-12-11 ENCOUNTER — Other Ambulatory Visit: Payer: Self-pay | Admitting: Oncology

## 2019-12-15 ENCOUNTER — Encounter (HOSPITAL_COMMUNITY): Payer: Self-pay

## 2019-12-15 ENCOUNTER — Other Ambulatory Visit: Payer: Self-pay

## 2019-12-15 ENCOUNTER — Ambulatory Visit (HOSPITAL_COMMUNITY)
Admission: RE | Admit: 2019-12-15 | Discharge: 2019-12-15 | Disposition: A | Discharge status: Home / Self Care | Payer: Medicare HMO | Source: Ambulatory Visit | Attending: Oncology | Admitting: Oncology

## 2019-12-15 DIAGNOSIS — C49A4 Gastrointestinal stromal tumor of large intestine: Secondary | ICD-10-CM | POA: Insufficient documentation

## 2019-12-15 DIAGNOSIS — K439 Ventral hernia without obstruction or gangrene: Secondary | ICD-10-CM | POA: Diagnosis not present

## 2019-12-15 DIAGNOSIS — K5730 Diverticulosis of large intestine without perforation or abscess without bleeding: Secondary | ICD-10-CM | POA: Diagnosis not present

## 2019-12-15 DIAGNOSIS — C49A0 Gastrointestinal stromal tumor, unspecified site: Secondary | ICD-10-CM | POA: Diagnosis not present

## 2019-12-15 DIAGNOSIS — K449 Diaphragmatic hernia without obstruction or gangrene: Secondary | ICD-10-CM | POA: Diagnosis not present

## 2019-12-15 MED ORDER — IOHEXOL 9 MG/ML PO SOLN
ORAL | Status: AC
  Filled 2019-12-15: qty 1000

## 2019-12-15 MED ORDER — IOHEXOL 9 MG/ML PO SOLN
500.0000 mL | ORAL | Status: AC
  Administered 2019-12-15 (×2): 500 mL via ORAL

## 2019-12-15 MED FILL — IMATINIB MESYLATE 100 MG TA: 100 | 30 days supply | Qty: 90 | Fill #0

## 2019-12-17 ENCOUNTER — Inpatient Hospital Stay (HOSPITAL_BASED_OUTPATIENT_CLINIC_OR_DEPARTMENT_OTHER): Payer: Medicare HMO | Admitting: Oncology

## 2019-12-17 ENCOUNTER — Other Ambulatory Visit: Payer: Self-pay

## 2019-12-17 VITALS — BP 138/90 | HR 101 | Temp 97.8°F | Resp 18 | Ht 64.0 in | Wt 190.5 lb

## 2019-12-17 DIAGNOSIS — D649 Anemia, unspecified: Secondary | ICD-10-CM | POA: Diagnosis not present

## 2019-12-17 DIAGNOSIS — Z86010 Personal history of colonic polyps: Secondary | ICD-10-CM | POA: Diagnosis not present

## 2019-12-17 DIAGNOSIS — C49A4 Gastrointestinal stromal tumor of large intestine: Secondary | ICD-10-CM | POA: Diagnosis not present

## 2019-12-17 DIAGNOSIS — Z862 Personal history of diseases of the blood and blood-forming organs and certain disorders involving the immune mechanism: Secondary | ICD-10-CM | POA: Diagnosis not present

## 2019-12-17 DIAGNOSIS — Z86711 Personal history of pulmonary embolism: Secondary | ICD-10-CM | POA: Diagnosis not present

## 2019-12-17 DIAGNOSIS — C49A5 Gastrointestinal stromal tumor of rectum: Secondary | ICD-10-CM | POA: Diagnosis not present

## 2019-12-17 MED FILL — SUTAB 1479-225-188 MG TABS: 1479-225-18 | 1 days supply | Qty: 24 | Fill #0

## 2019-12-17 NOTE — Progress Notes (Signed)
[place] OFFICE PROGRESS NOTE   Diagnosis: Gastrointestinal stromal tumor  INTERVAL HISTORY:   Ms. Wenzler returns as scheduled.  She continues Gleevec.  No rash, diarrhea, or edema.  She feels well.  Objective:  Vital signs in last 24 hours:  Blood pressure (!) 163/97, pulse (!) 101, temperature 97.8 F (36.6 C), temperature source Tympanic, resp. rate 18, height 5\' 4"  (1.626 m), weight 190 lb 8 oz (86.4 kg), SpO2 96 %.    HEENT: No thrush or ulcers Resp: Lungs clear bilaterally Cardio: Regular rate and rhythm GI: No hepatomegaly, soft fullness in the right lower abdomen, nontender Vascular: No leg edema  Skin: No rash    Lab Results:  Lab Results  Component Value Date   WBC 7.0 12/05/2019   HGB 12.6 12/05/2019   HCT 38.2 12/05/2019   MCV 86.0 12/05/2019   PLT 315 12/05/2019   NEUTROABS 5.1 12/05/2019    CMP  Lab Results  Component Value Date   NA 142 12/05/2019   K 3.0 (LL) 12/05/2019   CL 107 12/05/2019   CO2 27 12/05/2019   GLUCOSE 95 12/05/2019   BUN 7 (L) 12/05/2019   CREATININE 1.07 (H) 12/05/2019   CALCIUM 9.3 12/05/2019   PROT 7.9 12/05/2019   ALBUMIN 3.4 (L) 12/05/2019   AST 11 (L) 12/05/2019   ALT <6 12/05/2019   ALKPHOS 137 (H) 12/05/2019   BILITOT 1.2 12/05/2019   GFRNONAA 52 (L) 12/05/2019   GFRAA >60 12/05/2019     Imaging:  CT ABDOMEN PELVIS WO CONTRAST  Result Date: 12/15/2019 CLINICAL DATA:  GIST, Gleevec ongoing EXAM: CT ABDOMEN AND PELVIS WITHOUT CONTRAST TECHNIQUE: Multidetector CT imaging of the abdomen and pelvis was performed following the standard protocol without IV contrast. COMPARISON:  09/01/2019 FINDINGS: Lower chest: Scarring/atelectasis in the medial right lower lobe and left lung base. Three vessel coronary atherosclerosis. Prominent IVC. Hepatobiliary: Unenhanced liver is grossly unremarkable. Status post cholecystectomy. No intrahepatic or extrahepatic ductal dilatation. Pancreas: Within normal  limits. Spleen: Within normal limits. Adrenals/Urinary Tract: Adrenal glands are within normal limits. Kidneys are within normal limits. No renal calculi or hydronephrosis. Bladder is low lying but unremarkable. Stomach/Bowel: Stomach is notable for a tiny hiatal hernia. No evidence of bowel obstruction. Normal appendix (series 2/image 62). Mild sigmoid diverticulosis, without evidence of diverticulitis. Mild presacral post treatment changes. Vascular/Lymphatic: No evidence of abdominal aortic aneurysm. Atherosclerotic calcifications of the abdominal aorta and branch vessels. No suspicious abdominal lymphadenopathy. 8 mm short axis portacaval node (series 2/image 29), unchanged. Pelvic/peritoneal nodularity, overall grossly unchanged, including: --2.1 x 2.5 cm right presacral nodule (series 2/image 62), previously 1.9 x 2.3 cm --1.3 x 1.2 cm left perirectal nodule (series 2/image 35), previously 1.3 x 1.6 cm --1.5 x 1.0 cm left pelvic nodule (series 2/image 78), previously 1.2 x 1.4 cm Additional scattered small nodules along the sigmoid mesocolon measuring up to 7 mm short axis (series 2/images 58 and 64), grossly unchanged. Reproductive: Status post hysterectomy. Bilateral ovaries are unremarkable. Other: No abdominopelvic ascites. Mild diastasis of the midline anterior abdominal wall containing loops of colon and nondilated small bowel (series 2/image 42). Additional moderate right paramidline ventral hernia containing nondilated loops of small bowel (series 2/image 52). These are both unchanged. Musculoskeletal: Mild degenerative changes of the visualized thoracolumbar spine. IMPRESSION: Post treatment changes in the presacral region. Pelvic/peritoneal nodularity, overall grossly unchanged, with index lesions as above. Additional stable ancillary findings as above. Electronically Signed   By: Henderson Newcomer.D.  On: 12/15/2019 10:59    Medications: I have reviewed the patient's current  medications.   Assessment/Plan: 1.Gastrointestinal stromal tumor of the rectum  Status post an endoscopic ultrasound on 08/06/2014 confirming a mass abutting the distal rectal wall with an FNA biopsy confirming a gastrointestinal stromal tumor  Initiation of Gleevec 09/04/2014  MRI pelvis 12/15/2014 with interval decreased size of the large anorectal mass with central necrosis.  Continuation of Gleevec  Gleevec placed on hold 06/02/2015 in anticipation of surgery  06/04/2015 status post low anterior resection and diverting loop ileostomy, resection of tumor  Pathology: 9.5 cm tumor; GISTsubtype spindle; mitotic rate 1/50 high-power field; positive margin of resection  Adjuvant Gleevec 08/19/2015 (three-year in total course planned)  Ileostomy takedown 01/28/2016  Held 01/28/2016 through 02/14/2016, resumed 02/15/2016  Gleevec stop 04/11/2016-04/18/2016, and 04/26/2016-05/08/2016  Gleevec resumed at a dose of 200 mg daily on 05/09/2016  Gleevec dose increased to 300 mg daily beginning 06/20/2016  Gleevec dose decreased to 200 mg daily beginning 06/23/2016 (poor tolerance at 300 mg dose)  CTs 11/22/2017-enhancing soft tissue mass in the presacral space measuring 5.4 x 3.5 cm. Enlarged pelvic lymph nodes.  Gleevec continued at a dose of 200 mg daily  Restaging CTs 03/11/2018-interval decrease in size of the presacral soft tissue mass. Stable small pelvic lymph nodes.  Gleevec discontinued 03/13/2018  CT 11/14/2018-new 2.4 cm left pelvic nodule at the piriformis muscle, stable presacral lymph nodes, right abdominal wall hernia, ventral hernia  Gleevec 200 mg daily 12/04/2018  CTs11/30/2020-decrease in size of soft tissue nodule deep left pelvis. 5 nodules in the sigmoid mesocolon stable to slightly larger. Stable presacral soft tissue thickening. No findings for new metastatic disease.  CT abdomen/pelvis 09/01/2019-progressive presacral and left perirectal nodularity.  Additional mesenteric nodules along the sigmoid mesocolon and left pelvis unchanged.  Gleevec dose escalated to 300 mg daily 09/02/2019  Gleevec dose escalated to 400 mg alternating with 300 mg beginning 09/16/2019  Gleevec 300 mg daily beginning 09/30/2019 (unable to tolerate 400 mg due to diarrhea)  CT 12/15/2019-pelvic/peritoneal nodularity-unchanged 2. Remote history of pulmonary embolism-maintained on apixaban 3. Multiple colon polyps noted on the colonoscopy 07/21/2014 with the pathology revealing tubular adenomas 4. Hospitalization 06/21/2015 through 07/06/2015 with nausea/vomiting, high ileostomy output; found to have delayed gastric emptying. 5. Water soluble contrast enema 08/13/2015 positive for a small to moderate volume of water soluble contrast leakage from the low anterior resection and anastomosis site in the pelvis 6. Ileostomy takedown and resection of cystic abdominal wall mass ( benign pathology) on 01/28/2016 7. Anemia with ferritin in the low normal range 05/23/2016 and 06/20/2016. Ferrous sulfate increased to twice daily 06/26/2016.Improved.   Disposition: Ms. Sparano appears stable.  The restaging CT reveals no evidence of disease progression.  I reviewed the CT images with her.  The plan is to continue Gleevec at the current dose.  Ms. Deakins will return for an office and lab visit in 6 weeks.  She reports that she was not taking potassium when the potassium returned low on 12/05/2019.  She has now taking a potassium supplement.  She will have a repeat chemistry panel when she returns in 6 weeks.  Ms. Gambrell will be scheduled for a COVID-19 booster vaccine.  Betsy Coder, MD  12/17/2019  12:30 PM

## 2019-12-17 NOTE — Progress Notes (Deleted)
[place] OFFICE PROGRESS NOTE   Diagnosis:   INTERVAL HISTORY:   ***  Objective:  Vital signs in last 24 hours:  Blood pressure (!) 163/97, pulse (!) 101, temperature 97.8 F (36.6 C), temperature source Tympanic, resp. rate 18, height 5\' 4"  (1.626 m), weight 190 lb 8 oz (86.4 kg), SpO2 96 %.    HEENT: *** Lymphatics: *** Resp: *** Cardio: *** GI: *** Vascular: *** Neuro:***  Skin:***   Portacath/PICC-without erythema  Lab Results:  Lab Results  Component Value Date   WBC 7.0 12/05/2019   HGB 12.6 12/05/2019   HCT 38.2 12/05/2019   MCV 86.0 12/05/2019   PLT 315 12/05/2019   NEUTROABS 5.1 12/05/2019    CMP  Lab Results  Component Value Date   NA 142 12/05/2019   K 3.0 (LL) 12/05/2019   CL 107 12/05/2019   CO2 27 12/05/2019   GLUCOSE 95 12/05/2019   BUN 7 (L) 12/05/2019   CREATININE 1.07 (H) 12/05/2019   CALCIUM 9.3 12/05/2019   PROT 7.9 12/05/2019   ALBUMIN 3.4 (L) 12/05/2019   AST 11 (L) 12/05/2019   ALT <6 12/05/2019   ALKPHOS 137 (H) 12/05/2019   BILITOT 1.2 12/05/2019   GFRNONAA 52 (L) 12/05/2019   GFRAA >60 12/05/2019    No results found for: CEA1  Lab Results  Component Value Date   INR 0.94 01/26/2016    Imaging:  CT ABDOMEN PELVIS WO CONTRAST  Result Date: 12/15/2019 CLINICAL DATA:  GIST, Gleevec ongoing EXAM: CT ABDOMEN AND PELVIS WITHOUT CONTRAST TECHNIQUE: Multidetector CT imaging of the abdomen and pelvis was performed following the standard protocol without IV contrast. COMPARISON:  09/01/2019 FINDINGS: Lower chest: Scarring/atelectasis in the medial right lower lobe and left lung base. Three vessel coronary atherosclerosis. Prominent IVC. Hepatobiliary: Unenhanced liver is grossly unremarkable. Status post cholecystectomy. No intrahepatic or extrahepatic ductal dilatation. Pancreas: Within normal limits. Spleen: Within normal limits. Adrenals/Urinary Tract: Adrenal glands are within normal limits. Kidneys are  within normal limits. No renal calculi or hydronephrosis. Bladder is low lying but unremarkable. Stomach/Bowel: Stomach is notable for a tiny hiatal hernia. No evidence of bowel obstruction. Normal appendix (series 2/image 62). Mild sigmoid diverticulosis, without evidence of diverticulitis. Mild presacral post treatment changes. Vascular/Lymphatic: No evidence of abdominal aortic aneurysm. Atherosclerotic calcifications of the abdominal aorta and branch vessels. No suspicious abdominal lymphadenopathy. 8 mm short axis portacaval node (series 2/image 29), unchanged. Pelvic/peritoneal nodularity, overall grossly unchanged, including: --2.1 x 2.5 cm right presacral nodule (series 2/image 62), previously 1.9 x 2.3 cm --1.3 x 1.2 cm left perirectal nodule (series 2/image 35), previously 1.3 x 1.6 cm --1.5 x 1.0 cm left pelvic nodule (series 2/image 78), previously 1.2 x 1.4 cm Additional scattered small nodules along the sigmoid mesocolon measuring up to 7 mm short axis (series 2/images 58 and 64), grossly unchanged. Reproductive: Status post hysterectomy. Bilateral ovaries are unremarkable. Other: No abdominopelvic ascites. Mild diastasis of the midline anterior abdominal wall containing loops of colon and nondilated small bowel (series 2/image 42). Additional moderate right paramidline ventral hernia containing nondilated loops of small bowel (series 2/image 52). These are both unchanged. Musculoskeletal: Mild degenerative changes of the visualized thoracolumbar spine. IMPRESSION: Post treatment changes in the presacral region. Pelvic/peritoneal nodularity, overall grossly unchanged, with index lesions as above. Additional stable ancillary findings as above. Electronically Signed   By: Julian Hy M.D.   On: 12/15/2019 10:59    Medications: I have reviewed the patient's current medications.  Assessment/Plan: Gastrointestinal stromal tumor of the rectum  Status post an endoscopic ultrasound on  08/06/2014 confirming a mass abutting the distal rectal wall with an FNA biopsy confirming a gastrointestinal stromal tumor  Initiation of Gleevec 09/04/2014  MRI pelvis 12/15/2014 with interval decreased size of the large anorectal mass with central necrosis.  Continuation of Gleevec  Gleevec placed on hold 06/02/2015 in anticipation of surgery  06/04/2015 status post low anterior resection and diverting loop ileostomy, resection of tumor  Pathology: 9.5 cm tumor; GISTsubtype spindle; mitotic rate 1/50 high-power field; positive margin of resection  Adjuvant Gleevec 08/19/2015 (three-year in total course planned)  Ileostomy takedown 01/28/2016  Held 01/28/2016 through 02/14/2016, resumed 02/15/2016  Gleevec stop 04/11/2016-04/18/2016, and 04/26/2016-05/08/2016  Gleevec resumed at a dose of 200 mg daily on 05/09/2016  Gleevec dose increased to 300 mg daily beginning 06/20/2016  Gleevec dose decreased to 200 mg daily beginning 06/23/2016 (poor tolerance at 300 mg dose)  CTs 11/22/2017-enhancing soft tissue mass in the presacral space measuring 5.4 x 3.5 cm. Enlarged pelvic lymph nodes.  Gleevec continued at a dose of 200 mg daily  Restaging CTs 03/11/2018-interval decrease in size of the presacral soft tissue mass. Stable small pelvic lymph nodes.  Gleevec discontinued 03/13/2018  CT 11/14/2018-new 2.4 cm left pelvic nodule at the piriformis muscle, stable presacral lymph nodes, right abdominal wall hernia, ventral hernia  Gleevec 200 mg daily 12/04/2018  CTs11/30/2020-decrease in size of soft tissue nodule deep left pelvis. 5 nodules in the sigmoid mesocolon stable to slightly larger. Stable presacral soft tissue thickening. No findings for new metastatic disease.  CT abdomen/pelvis 09/01/2019-progressive presacral and left perirectal nodularity. Additional mesenteric nodules along the sigmoid mesocolon and left pelvis unchanged.  Gleevec dose escalated to 300 mg  daily 09/02/2019  Gleevec dose escalated to 400 mg alternating with 300 mg beginning 09/16/2019  Gleevec 300 mg daily beginning 09/30/2019 (unable to tolerate 400 mg due to diarrhea) 2. Remote history of pulmonary embolism-maintained on apixaban 3. Multiple colon polyps noted on the colonoscopy 07/21/2014 with the pathology revealing tubular adenomas 4. Hospitalization 06/21/2015 through 07/06/2015 with nausea/vomiting, high ileostomy output; found to have delayed gastric emptying. 5. Water soluble contrast enema 08/13/2015 positive for a small to moderate volume of water soluble contrast leakage from the low anterior resection and anastomosis site in the pelvis 6. Ileostomy takedown and resection of cystic abdominal wall mass ( benign pathology) on 01/28/2016 7. Anemia with ferritin in the low normal range 05/23/2016 and 06/20/2016. Ferrous sulfate increased to twice daily 06/26/2016.Improved.     Disposition: ***  Betsy Coder, MD  12/17/2019  12:33 PM

## 2019-12-18 ENCOUNTER — Telehealth: Payer: Self-pay | Admitting: Nurse Practitioner

## 2019-12-18 NOTE — Telephone Encounter (Signed)
Scheduled appointments per 8/18 los. Patient is aware of appointments dates and times.

## 2019-12-30 DIAGNOSIS — H524 Presbyopia: Secondary | ICD-10-CM | POA: Diagnosis not present

## 2019-12-30 DIAGNOSIS — H43821 Vitreomacular adhesion, right eye: Secondary | ICD-10-CM | POA: Diagnosis not present

## 2019-12-30 DIAGNOSIS — H52203 Unspecified astigmatism, bilateral: Secondary | ICD-10-CM | POA: Diagnosis not present

## 2019-12-30 DIAGNOSIS — Z961 Presence of intraocular lens: Secondary | ICD-10-CM | POA: Diagnosis not present

## 2019-12-31 ENCOUNTER — Inpatient Hospital Stay: Payer: Medicare HMO | Attending: Oncology

## 2020-01-06 ENCOUNTER — Encounter: Payer: Self-pay | Admitting: Internal Medicine

## 2020-01-06 ENCOUNTER — Other Ambulatory Visit: Payer: Self-pay | Admitting: Internal Medicine

## 2020-01-06 NOTE — Telephone Encounter (Signed)
Please refill as per office routine med refill policy (all routine meds refilled for 3 mo or monthly per pt preference up to one year from last visit, then month to month grace period for 3 mo, then further med refills will have to be denied)  

## 2020-01-07 ENCOUNTER — Encounter: Payer: Medicare HMO | Admitting: Gastroenterology

## 2020-01-09 ENCOUNTER — Inpatient Hospital Stay: Payer: Medicare HMO

## 2020-01-09 ENCOUNTER — Other Ambulatory Visit: Payer: Self-pay

## 2020-01-09 DIAGNOSIS — Z23 Encounter for immunization: Secondary | ICD-10-CM

## 2020-01-09 NOTE — Progress Notes (Signed)
   Covid-19 Vaccination Clinic  Name:  Brianna Walker    MRN: [id number] DOB: [birth year]  01/09/2020  [name] was observed post Covid-19 immunization for 15 minutes without incident. She was provided with Vaccine Information Sheet and instruction to access the V-Safe system.   [name] was instructed to call 911 with any severe reactions post vaccine: Marland Kitchen Difficulty breathing  . Swelling of face and throat  . A fast heartbeat  . A bad rash all over body  . Dizziness and weakness

## 2020-01-12 MED FILL — IMATINIB MESYLATE 100 MG TA: 100 | 30 days supply | Qty: 90 | Fill #1

## 2020-01-20 DIAGNOSIS — H35371 Puckering of macula, right eye: Secondary | ICD-10-CM | POA: Diagnosis not present

## 2020-01-20 DIAGNOSIS — H43821 Vitreomacular adhesion, right eye: Secondary | ICD-10-CM | POA: Diagnosis not present

## 2020-01-21 ENCOUNTER — Encounter: Payer: Medicare HMO | Admitting: Gastroenterology

## 2020-01-21 ENCOUNTER — Other Ambulatory Visit: Payer: Self-pay

## 2020-01-21 ENCOUNTER — Encounter: Payer: Self-pay | Admitting: Gastroenterology

## 2020-01-21 ENCOUNTER — Ambulatory Visit (AMBULATORY_SURGERY_CENTER): Payer: Medicare HMO | Admitting: Gastroenterology

## 2020-01-21 VITALS — BP 144/74 | HR 83 | Temp 97.1°F | Resp 22 | Ht 64.0 in | Wt 190.0 lb

## 2020-01-21 DIAGNOSIS — D124 Benign neoplasm of descending colon: Secondary | ICD-10-CM | POA: Diagnosis not present

## 2020-01-21 DIAGNOSIS — Z85038 Personal history of other malignant neoplasm of large intestine: Secondary | ICD-10-CM | POA: Diagnosis not present

## 2020-01-21 DIAGNOSIS — D120 Benign neoplasm of cecum: Secondary | ICD-10-CM

## 2020-01-21 DIAGNOSIS — C49A4 Gastrointestinal stromal tumor of large intestine: Secondary | ICD-10-CM

## 2020-01-21 DIAGNOSIS — Z86010 Personal history of colonic polyps: Secondary | ICD-10-CM

## 2020-01-21 MED ORDER — SODIUM CHLORIDE 0.9 % IV SOLN: 500.0000 mL | Freq: Once | INTRAVENOUS | Status: DC

## 2020-01-21 NOTE — Patient Instructions (Signed)
HANDOUTS GIVEN:  Diverticulosis, Hemorrhoids, polyps Resume eliquis in 2 days at p;rior dose Resume previous diet Continue current medications Await pathology results No aspirin, ibuprofen, naproxen, or other NSAIDS FOR 2 WEEKS AFTER TODAYS PROCEEDURE   YOU HAD AN ENDOSCOPIC PROCEDURE TODAY AT [facility]:   Refer to the procedure report that was given to you for any specific questions about what was found during the examination.  If the procedure report does not answer your questions, please call your gastroenterologist to clarify.  If you requested that your care partner not be given the details of your procedure findings, then the procedure report has been included in a sealed envelope for you to review at your convenience later.  YOU SHOULD EXPECT: Some feelings of bloating in the abdomen. Passage of more gas than usual.  Walking can help get rid of the air that was put into your GI tract during the procedure and reduce the bloating. If you had a lower endoscopy (such as a colonoscopy or flexible sigmoidoscopy) you may notice spotting of blood in your stool or on the toilet paper. If you underwent a bowel prep for your procedure, you may not have a normal bowel movement for a few days.  Please Note:  You might notice some irritation and congestion in your nose or some drainage.  This is from the oxygen used during your procedure.  There is no need for concern and it should clear up in a day or so.  SYMPTOMS TO REPORT IMMEDIATELY:   Following lower endoscopy (colonoscopy or flexible sigmoidoscopy):  Excessive amounts of blood in the stool  Significant tenderness or worsening of abdominal pains  Swelling of the abdomen that is new, acute  Fever of 100F or higher    For urgent or emergent issues, a gastroenterologist can be reached at any hour by calling [phone]. Do not use MyChart messaging for urgent concerns.    DIET:  We do recommend a small meal at first,  but then you may proceed to your regular diet.  Drink plenty of fluids but you should avoid alcoholic beverages for 24 hours.  ACTIVITY:  You should plan to take it easy for the rest of today and you should NOT DRIVE or use heavy machinery until tomorrow (because of the sedation medicines used during the test).    FOLLOW UP: Our staff will call the number listed on your records 48-72 hours following your procedure to check on you and address any questions or concerns that you may have regarding the information given to you following your procedure. If we do not reach you, we will leave a message.  We will attempt to reach you two times.  During this call, we will ask if you have developed any symptoms of COVID 19. If you develop any symptoms (ie: fever, flu-like symptoms, shortness of breath, cough etc.) before then, please call [phone].  If you test positive for Covid 19 in the 2 weeks post procedure, please call and report this information to [place].    If any biopsies were taken you will be contacted by phone or by letter within the next 1-3 weeks.  Please call us at [phone] if you have not heard about the biopsies in 3 weeks.    SIGNATURES/CONFIDENTIALITY: You and/or your care partner have signed paperwork which will be entered into your electronic medical record.  These signatures attest to the fact that that the information above on your After Visit Summary has  been reviewed and is understood.  Full responsibility of the confidentiality of this discharge information lies with you and/or your care-partner.

## 2020-01-21 NOTE — Progress Notes (Signed)
Called to room to assist during endoscopic procedure.  Patient ID and intended procedure confirmed with present staff. Received instructions for my participation in the procedure from the performing physician.  

## 2020-01-21 NOTE — Progress Notes (Signed)
To PACU, VSS. Report to [email or web address] 

## 2020-01-21 NOTE — Op Note (Signed)
[facility] Patient Name: Brianna Walker Procedure Date: 01/21/2020 9:09 AM MRN: [id number] Endoscopist: Ladene Artist , MD Age: 74 Referring MD:  Date of Birth: [birth year] Gender: Female Account #: [id number] Procedure:                Colonoscopy Indications:              Surveillance: Personal history of adenomatous                            polyps on last colonoscopy 5 years ago Medicines:                Monitored Anesthesia Care Procedure:                Pre-Anesthesia Assessment:                           - Prior to the procedure, a History and Physical                            was performed, and patient medications and                            allergies were reviewed. The patient's tolerance of                            previous anesthesia was also reviewed. The risks                            and benefits of the procedure and the sedation                            options and risks were discussed with the patient.                            All questions were answered, and informed consent                            was obtained. Prior Anticoagulants: The patient has                            taken Eliquis (apixaban), last dose was 2 days                            prior to procedure. ASA Grade Assessment: III - A                            patient with severe systemic disease. After                            reviewing the risks and benefits, the patient was                            deemed in satisfactory condition to undergo the  procedure.                           After obtaining informed consent, the colonoscope                            was passed under direct vision. Throughout the                            procedure, the patient's blood pressure, pulse, and                            oxygen saturations were monitored continuously. The                            Colonoscope was introduced through the anus and                             advanced to the the cecum, identified by                            appendiceal orifice and ileocecal valve. The                            ileocecal valve, appendiceal orifice, and rectum                            were photographed. The quality of the bowel                            preparation was adequate after extensive lavage and                            suction. The colonoscopy was performed without                            difficulty. The patient tolerated the procedure                            well. Scope In: 9:18:52 AM Scope Out: 9:39:36 AM Scope Withdrawal Time: 0 hours 18 minutes 0 seconds  Total Procedure Duration: 0 hours 20 minutes 44 seconds  Findings:                 The digital rectal exam was abnormal, post surgical.                           A 12 mm polyp was found in the cecum. The polyp was                            sessile. The polyp was removed with a cold snare.                            Resection and retrieval were complete. The  polypectomy site was oozing with a clot forming. To                            prevent bleeding after the polypectomy, two                            hemostatic clips were successfully placed (MR                            conditional). There was no bleeding at the end of                            the procedure.                           A 6 mm polyp was found in the cecum. The polyp was                            sessile. The polyp was removed with a cold snare.                            Resection and retrieval were complete.                           A 4 mm polyp was found in the cecum. The polyp was                            sessile. The polyp was removed with a cold biopsy                            forceps. Resection and retrieval were complete.                           Two sessile polyps were found in the descending                            colon. The polyps were 7 to 8 mm in  size. These                            polyps were removed with a cold snare. Resection                            and retrieval were complete.                           Multiple medium-mouthed diverticula were found in                            the entire colon. There was no evidence of                            diverticular bleeding.  There was evidence of a prior end-to-side                            colo-colonic anastomosis in the distal rectum. This                            was patent and was characterized by healthy                            appearing mucosa. The anastomosis was traversed.                           The exam was otherwise without abnormality on                            direct views. Retroflexion not performed with a                            narrow vault, altered anatomy. Complications:            No immediate complications. Estimated blood loss:                            None. Estimated Blood Loss:     Estimated blood loss: none. Impression:               - Abnormal digital rectal exam, post surgical                            changes.                           - One 12 mm polyp in the cecum, removed with a cold                            snare. Resected and retrieved. Clips (MR                            conditional) were placed.                           - One 6 mm polyp in the cecum, removed with a cold                            snare. Resected and retrieved.                           - One 4 mm polyp in the cecum, removed with a cold                            biopsy forceps. Resected and retrieved.                           - Two 7 to 8 mm polyps in the descending colon,  removed with a cold snare. Resected and retrieved.                           - Moderate diverticulosis in the entire examined                            colon. .                           - Patent end-to-side colo-colonic  anastomosis in                            the distal rectum, characterized by healthy                            appearing mucosa.                           - The examination was otherwise normal on direct                            and retroflexion views. Recommendation:           - Repeat colonoscopy after studies are complete for                            surveillance based on pathology results with a more                            extensive bowel prep.                           - Resume Eliquis (apixaban) in 2 days at prior                            dose. Refer to managing physician for further                            adjustment of therapy.                           - Patient has a contact number available for                            emergencies. The signs and symptoms of potential                            delayed complications were discussed with the                            patient. Return to normal activities tomorrow.                            Written discharge instructions were provided to the  patient.                           - Resume previous diet.                           - Continue present medications.                           - Await pathology results.                           - No aspirin, ibuprofen, naproxen, or other                            non-steroidal anti-inflammatory drugs for 2 weeks                            after polyp removal. Ladene Artist, MD 01/21/2020 9:49:01 AM This report has been signed electronically.

## 2020-01-21 NOTE — Progress Notes (Signed)
IV SF, Vitals SH.

## 2020-01-23 ENCOUNTER — Telehealth: Payer: Self-pay

## 2020-01-23 NOTE — Telephone Encounter (Signed)
  Follow up Call-  Call back number 01/21/2020  Post procedure Call Back phone  # [phone]  Permission to leave phone message Yes  Some recent data might be hidden     Patient questions:  Do you have a fever, pain , or abdominal swelling? No. Pain Score  0 *  Have you tolerated food without any problems? Yes.    Have you been able to return to your normal activities? Yes.    Do you have any questions about your discharge instructions: Diet   No. Medications  No. Follow up visit  No.  Do you have questions or concerns about your Care? No.  Actions: * If pain score is 4 or above: No action needed, pain <4.  Pt wanted to thank everyone that took great care of her.  Maw   1. Have you developed a fever since your procedure? no  2.   Have you had an respiratory symptoms (SOB or cough) since your procedure? no  3.   Have you tested positive for COVID 19 since your procedure no  4.   Have you had any family members/close contacts diagnosed with the COVID 19 since your procedure?  no   If yes to any of these questions please route to Joylene John, RN and Joella Prince, RN

## 2020-01-28 ENCOUNTER — Inpatient Hospital Stay: Payer: Medicare HMO

## 2020-01-28 ENCOUNTER — Inpatient Hospital Stay: Payer: Medicare HMO | Admitting: Nurse Practitioner

## 2020-01-30 ENCOUNTER — Encounter: Payer: Self-pay | Admitting: Gastroenterology

## 2020-02-03 ENCOUNTER — Other Ambulatory Visit: Payer: Self-pay | Admitting: Oncology

## 2020-02-03 ENCOUNTER — Encounter: Payer: Self-pay | Admitting: *Deleted

## 2020-02-03 ENCOUNTER — Telehealth: Payer: Self-pay | Admitting: Oncology

## 2020-02-03 NOTE — Progress Notes (Signed)
Refilled Gleevec x 1 month w/note on script to call office to schedule missed appointment. Scheduling message sent as well.

## 2020-02-03 NOTE — Telephone Encounter (Signed)
Scheduled appt per 10/5 sch msg - left message with appt date and time

## 2020-02-04 ENCOUNTER — Other Ambulatory Visit: Payer: Self-pay | Admitting: Internal Medicine

## 2020-02-05 ENCOUNTER — Telehealth: Payer: Self-pay | Admitting: Internal Medicine

## 2020-02-05 MED FILL — IMATINIB MESYLATE 100 MG TA: 100 | 30 days supply | Qty: 90 | Fill #0

## 2020-02-05 NOTE — Telephone Encounter (Signed)
Brianna Walker with [facility] called and was wanting an update on the D/C of ELIQUIS 5 MG TABS tablet  4 days prior to her surgery. Brianna Walker can be reached at [phone]

## 2020-02-09 NOTE — Telephone Encounter (Signed)
Fax resent with the hold on Eliquis 5mg  Tab.

## 2020-02-16 NOTE — Telephone Encounter (Signed)
Fax has succesfully went through at 10:07 am 02/16/2020.

## 2020-02-16 NOTE — Telephone Encounter (Signed)
I  Have re-faxed this form this morning to both fax numbers that were given to [place] by phone... I Have faxed the [phone] 4x's and 5x's to [phone].  Th faxes are not going through as it is saying the numbers are busy/no answer.

## 2020-02-16 NOTE — Telephone Encounter (Signed)
Brianna Walker with [facility] called and was wondering if the clearance could be resent. She said they did not receive it.   Fax: [phone] Phone: [phone]

## 2020-02-20 ENCOUNTER — Inpatient Hospital Stay: Payer: Medicare HMO

## 2020-02-20 ENCOUNTER — Inpatient Hospital Stay: Payer: Medicare HMO | Attending: Oncology | Admitting: Nurse Practitioner

## 2020-02-20 ENCOUNTER — Encounter: Payer: Self-pay | Admitting: Nurse Practitioner

## 2020-02-20 ENCOUNTER — Other Ambulatory Visit: Payer: Self-pay

## 2020-02-20 VITALS — BP 142/84 | HR 96 | Temp 98.5°F | Resp 17 | Ht 64.0 in | Wt 189.8 lb

## 2020-02-20 DIAGNOSIS — R197 Diarrhea, unspecified: Secondary | ICD-10-CM | POA: Insufficient documentation

## 2020-02-20 DIAGNOSIS — C49A4 Gastrointestinal stromal tumor of large intestine: Secondary | ICD-10-CM | POA: Diagnosis not present

## 2020-02-20 DIAGNOSIS — Z86010 Personal history of colonic polyps: Secondary | ICD-10-CM | POA: Diagnosis not present

## 2020-02-20 DIAGNOSIS — Z862 Personal history of diseases of the blood and blood-forming organs and certain disorders involving the immune mechanism: Secondary | ICD-10-CM | POA: Insufficient documentation

## 2020-02-20 DIAGNOSIS — Z86711 Personal history of pulmonary embolism: Secondary | ICD-10-CM | POA: Insufficient documentation

## 2020-02-20 DIAGNOSIS — C49A5 Gastrointestinal stromal tumor of rectum: Secondary | ICD-10-CM | POA: Diagnosis not present

## 2020-02-20 LAB — CBC WITH DIFFERENTIAL (CANCER CENTER ONLY)
Abs Immature Granulocytes: 0.02 10*3/uL (ref 0.00–0.07)
Basophils Absolute: 0 10*3/uL (ref 0.0–0.1)
Basophils Relative: 0 %
Eosinophils Absolute: 0.1 10*3/uL (ref 0.0–0.5)
Eosinophils Relative: 1 %
HCT: 37.7 % (ref 36.0–46.0)
Hemoglobin: 12.4 g/dL (ref 12.0–15.0)
Immature Granulocytes: 0 %
Lymphocytes Relative: 21 %
Lymphs Abs: 2 10*3/uL (ref 0.7–4.0)
MCH: 28.6 pg (ref 26.0–34.0)
MCHC: 32.9 g/dL (ref 30.0–36.0)
MCV: 86.9 fL (ref 80.0–100.0)
Monocytes Absolute: 0.5 10*3/uL (ref 0.1–1.0)
Monocytes Relative: 5 %
Neutro Abs: 6.7 10*3/uL (ref 1.7–7.7)
Neutrophils Relative %: 73 %
Platelet Count: 306 10*3/uL (ref 150–400)
RBC: 4.34 MIL/uL (ref 3.87–5.11)
RDW: 14 % (ref 11.5–15.5)
WBC Count: 9.3 10*3/uL (ref 4.0–10.5)
nRBC: 0 % (ref 0.0–0.2)

## 2020-02-20 LAB — CMP (CANCER CENTER ONLY)
ALT: <6 U/L (ref 0–44)
AST: 10 U/L — ABNORMAL LOW (ref 15–41)
Albumin: 3.4 g/dL — ABNORMAL LOW (ref 3.5–5.0)
Alkaline Phosphatase: 125 U/L (ref 38–126)
Anion gap: 6 (ref 5–15)
BUN: 12 mg/dL (ref 8–23)
CO2: 26 mmol/L (ref 22–32)
Calcium: 8.8 mg/dL — ABNORMAL LOW (ref 8.9–10.3)
Chloride: 109 mmol/L (ref 98–111)
Creatinine: 1.25 mg/dL — ABNORMAL HIGH (ref 0.44–1.00)
GFR, Estimated: 46 mL/min — ABNORMAL LOW (ref >60)
Glucose, Bld: 78 mg/dL (ref 70–99)
Potassium: 3.9 mmol/L (ref 3.5–5.1)
Sodium: 141 mmol/L (ref 135–145)
Total Bilirubin: 0.6 mg/dL (ref 0.3–1.2)
Total Protein: 7.6 g/dL (ref 6.5–8.1)

## 2020-02-20 LAB — MAGNESIUM: Magnesium: 1.7 mg/dL (ref 1.7–2.4)

## 2020-02-20 NOTE — Progress Notes (Signed)
Brianna OFFICE PROGRESS NOTE   Diagnosis: Gastrointestinal stromal tumor  INTERVAL HISTORY:   Brianna Walker returns as scheduled.  She continues Gleevec.  She denies nausea/vomiting.  No mouth sores.  She has occasional loose stools which she attributes to her diet.  No abdominal pain.  No rash.  Objective:  Vital signs in last 24 hours:  Blood pressure (!) 142/84, pulse 96, temperature 98.5 F (36.9 C), temperature source Tympanic, resp. rate 17, height 5\' 4"  (1.626 m), weight 189 lb 12.8 oz (86.1 kg), SpO2 100 %.    HEENT: No thrush or ulcers. Resp: Lungs clear bilaterally. Cardio: Regular rate and rhythm. GI: Abdomen soft and nontender.  No hepatomegaly.  Right abdomen hernia. Vascular: No leg edema.  Skin: No rash.   Lab Results:  Lab Results  Component Value Date   WBC 9.3 02/20/2020   HGB 12.4 02/20/2020   HCT 37.7 02/20/2020   MCV 86.9 02/20/2020   PLT 306 02/20/2020   NEUTROABS 6.7 02/20/2020    Imaging:  No results found.  Medications: I have reviewed the patient's current medications.  Assessment/Plan: 1.Gastrointestinal stromal tumor of the rectum  Status post an endoscopic ultrasound on 08/06/2014 confirming a mass abutting the distal rectal wall with an FNA biopsy confirming a gastrointestinal stromal tumor  Initiation of Gleevec 09/04/2014  MRI pelvis 12/15/2014 with interval decreased size of the large anorectal mass with central necrosis.  Continuation of Gleevec  Gleevec placed on hold 06/02/2015 in anticipation of surgery  06/04/2015 status post low anterior resection and diverting loop ileostomy, resection of tumor  Pathology: 9.5 cm tumor; GISTsubtype spindle; mitotic rate 1/50 high-power field; positive margin of resection  Adjuvant Gleevec 08/19/2015 (three-year in total course planned)  Ileostomy takedown 01/28/2016  Held 01/28/2016 through 02/14/2016, resumed 02/15/2016  Gleevec stop 04/11/2016-04/18/2016,  and 04/26/2016-05/08/2016  Gleevec resumed at a dose of 200 mg daily on 05/09/2016  Gleevec dose increased to 300 mg daily beginning 06/20/2016  Gleevec dose decreased to 200 mg daily beginning 06/23/2016 (poor tolerance at 300 mg dose)  CTs 11/22/2017-enhancing soft tissue mass in the presacral space measuring 5.4 x 3.5 cm. Enlarged pelvic lymph nodes.  Gleevec continued at a dose of 200 mg daily  Restaging CTs 03/11/2018-interval decrease in size of the presacral soft tissue mass. Stable small pelvic lymph nodes.  Gleevec discontinued 03/13/2018  CT 11/14/2018-new 2.4 cm left pelvic nodule at the piriformis muscle, stable presacral lymph nodes, right abdominal wall hernia, ventral hernia  Gleevec 200 mg daily 12/04/2018  CTs11/30/2020-decrease in size of soft tissue nodule deep left pelvis. 5 nodules in the sigmoid mesocolon stable to slightly larger. Stable presacral soft tissue thickening. No findings for new metastatic disease.  CT abdomen/pelvis 09/01/2019-progressive presacral and left perirectal nodularity. Additional mesenteric nodules along the sigmoid mesocolon and left pelvis unchanged.  Gleevec dose escalated to 300 mg daily 09/02/2019  Gleevec dose escalated to 400 mg alternating with 300 mg beginning 09/16/2019  Gleevec 300 mg daily beginning 09/30/2019 (unable to tolerate 400 mg due to diarrhea)  CT 12/15/2019-pelvic/peritoneal nodularity-unchanged 2. Remote history of pulmonary embolism-maintained on apixaban 3. Multiple colon polyps noted on the colonoscopy 07/21/2014 with the pathology revealing tubular adenomas 4. Hospitalization 06/21/2015 through 07/06/2015 with nausea/vomiting, high ileostomy output; found to have delayed gastric emptying. 5. Water soluble contrast enema 08/13/2015 positive for a small to moderate volume of water soluble contrast leakage from the low anterior resection and anastomosis site in the pelvis 6. Ileostomy takedown and resection of  cystic abdominal wall mass ( benign pathology) on 01/28/2016 7. Anemia with ferritin in the low normal range 05/23/2016 and 06/20/2016. Ferrous sulfate increased to twice daily 06/26/2016.Improved.   Disposition: Brianna Walker appears stable.  There is no clinical evidence of disease progression.  She will continue Gleevec.  We reviewed the CBC and chemistry panel from today.  Labs adequate to continue as above.  She will return for lab and follow-up in 6 weeks.    Ned Card ANP/GNP-BC   02/20/2020  3:12 PM

## 2020-02-23 DIAGNOSIS — H3581 Retinal edema: Secondary | ICD-10-CM | POA: Diagnosis not present

## 2020-02-23 DIAGNOSIS — H35371 Puckering of macula, right eye: Secondary | ICD-10-CM | POA: Diagnosis not present

## 2020-02-24 DIAGNOSIS — H35371 Puckering of macula, right eye: Secondary | ICD-10-CM | POA: Diagnosis not present

## 2020-03-01 ENCOUNTER — Other Ambulatory Visit: Payer: Self-pay | Admitting: Oncology

## 2020-03-03 DIAGNOSIS — H35371 Puckering of macula, right eye: Secondary | ICD-10-CM | POA: Diagnosis not present

## 2020-03-03 DIAGNOSIS — H3581 Retinal edema: Secondary | ICD-10-CM | POA: Diagnosis not present

## 2020-03-08 MED FILL — IMATINIB MESYLATE 100 MG TA: 100 | 30 days supply | Qty: 90 | Fill #0

## 2020-03-15 ENCOUNTER — Other Ambulatory Visit: Payer: Self-pay | Admitting: Internal Medicine

## 2020-03-16 ENCOUNTER — Telehealth: Payer: Self-pay | Admitting: *Deleted

## 2020-03-16 ENCOUNTER — Telehealth: Payer: Self-pay | Admitting: Oncology

## 2020-03-16 NOTE — Telephone Encounter (Signed)
Scheduled appt per 11/16 sch msg - no answer / vmail full

## 2020-03-16 NOTE — Telephone Encounter (Signed)
Left VM requesting flu vaccine on 03/23/20 at 2:15 pm. Scheduling message sent.

## 2020-03-23 ENCOUNTER — Inpatient Hospital Stay: Payer: Medicare HMO | Attending: Oncology

## 2020-03-23 ENCOUNTER — Other Ambulatory Visit: Payer: Self-pay

## 2020-03-23 DIAGNOSIS — Z23 Encounter for immunization: Secondary | ICD-10-CM | POA: Insufficient documentation

## 2020-03-23 DIAGNOSIS — C49A5 Gastrointestinal stromal tumor of rectum: Secondary | ICD-10-CM | POA: Insufficient documentation

## 2020-03-23 MED ORDER — INFLUENZA VAC A&B SA ADJ QUAD 0.5 ML IM PRSY
PREFILLED_SYRINGE | INTRAMUSCULAR | Status: AC
  Filled 2020-03-23: qty 0.5

## 2020-03-23 MED ORDER — INFLUENZA VAC A&B SA ADJ QUAD 0.5 ML IM PRSY
0.5000 mL | PREFILLED_SYRINGE | Freq: Once | INTRAMUSCULAR | Status: AC
  Administered 2020-03-23: 0.5 mL via INTRAMUSCULAR

## 2020-03-23 NOTE — Patient Instructions (Signed)
Influenza Virus Vaccine injection What is this medicine? INFLUENZA VIRUS VACCINE (in floo EN zuh VAHY ruhs vak SEEN) helps to reduce the risk of getting influenza also known as the flu. The vaccine only helps protect you against some strains of the flu. This medicine may be used for other purposes; ask your health care provider or pharmacist if you have questions. COMMON BRAND NAME(S): Afluria, Afluria Quadrivalent, Agriflu, Alfuria, FLUAD, Fluarix, Fluarix Quadrivalent, Flublok, Flublok Quadrivalent, FLUCELVAX, FLUCELVAX Quadrivalent, Flulaval, Flulaval Quadrivalent, Fluvirin, Fluzone, Fluzone High-Dose, Fluzone Intradermal, Fluzone Quadrivalent What should I tell my health care provider before I take this medicine? They need to know if you have any of these conditions:  bleeding disorder like hemophilia  fever or infection  Guillain-Barre syndrome or other neurological problems  immune system problems  infection with the human immunodeficiency virus (HIV) or AIDS  low blood platelet counts  multiple sclerosis  an unusual or allergic reaction to influenza virus vaccine, latex, other medicines, foods, dyes, or preservatives. Different brands of vaccines contain different allergens. Some may contain latex or eggs. Talk to your doctor about your allergies to make sure that you get the right vaccine.  pregnant or trying to get pregnant  breast-feeding How should I use this medicine? This vaccine is for injection into a muscle or under the skin. It is given by a health care professional. A copy of Vaccine Information Statements will be given before each vaccination. Read this sheet carefully each time. The sheet may change frequently. Talk to your healthcare provider to see which vaccines are right for you. Some vaccines should not be used in all age groups. Overdosage: If you think you have taken too much of this medicine contact a poison control center or emergency room at once. NOTE:  This medicine is only for you. Do not share this medicine with others. What if I miss a dose? This does not apply. What may interact with this medicine?  chemotherapy or radiation therapy  medicines that lower your immune system like etanercept, anakinra, infliximab, and adalimumab  medicines that treat or prevent blood clots like warfarin  phenytoin  steroid medicines like prednisone or cortisone  theophylline  vaccines This list may not describe all possible interactions. Give your health care provider a list of all the medicines, herbs, non-prescription drugs, or dietary supplements you use. Also tell them if you smoke, drink alcohol, or use illegal drugs. Some items may interact with your medicine. What should I watch for while using this medicine? Report any side effects that do not go away within 3 days to your doctor or health care professional. Call your health care provider if any unusual symptoms occur within 6 weeks of receiving this vaccine. You may still catch the flu, but the illness is not usually as bad. You cannot get the flu from the vaccine. The vaccine will not protect against colds or other illnesses that may cause fever. The vaccine is needed every year. What side effects may I notice from receiving this medicine? Side effects that you should report to your doctor or health care professional as soon as possible:  allergic reactions like skin rash, itching or hives, swelling of the face, lips, or tongue Side effects that usually do not require medical attention (report to your doctor or health care professional if they continue or are bothersome):  fever  headache  muscle aches and pains  pain, tenderness, redness, or swelling at the injection site  tiredness This list may not describe  all possible side effects. Call your doctor for medical advice about side effects. You may report side effects to FDA at 1-800-FDA-1088. Where should I keep my medicine? The  vaccine will be given by a health care professional in a clinic, pharmacy, doctor's office, or other health care setting. You will not be given vaccine doses to store at home. NOTE: This sheet is a summary. It may not cover all possible information. If you have questions about this medicine, talk to your doctor, pharmacist, or health care provider.  2020 Elsevier/Gold Standard (2018-03-12 08:45:43)

## 2020-03-26 ENCOUNTER — Telehealth: Payer: Self-pay | Admitting: Nurse Practitioner

## 2020-03-26 NOTE — Telephone Encounter (Signed)
Rescheduled appts per 11/24 sch msg. Pt confirmed new appt date and time.

## 2020-03-30 DIAGNOSIS — H43812 Vitreous degeneration, left eye: Secondary | ICD-10-CM | POA: Diagnosis not present

## 2020-03-30 DIAGNOSIS — H3581 Retinal edema: Secondary | ICD-10-CM | POA: Diagnosis not present

## 2020-04-02 ENCOUNTER — Other Ambulatory Visit: Payer: Medicare HMO

## 2020-04-02 ENCOUNTER — Ambulatory Visit: Payer: Medicare HMO | Admitting: Oncology

## 2020-04-05 ENCOUNTER — Other Ambulatory Visit: Payer: Self-pay | Admitting: Oncology

## 2020-04-09 ENCOUNTER — Other Ambulatory Visit: Payer: Self-pay

## 2020-04-09 ENCOUNTER — Inpatient Hospital Stay (HOSPITAL_BASED_OUTPATIENT_CLINIC_OR_DEPARTMENT_OTHER): Payer: Medicare HMO | Admitting: Nurse Practitioner

## 2020-04-09 ENCOUNTER — Encounter: Payer: Self-pay | Admitting: Nurse Practitioner

## 2020-04-09 ENCOUNTER — Inpatient Hospital Stay: Payer: Medicare HMO | Attending: Oncology

## 2020-04-09 VITALS — BP 138/75 | HR 90 | Temp 98.3°F | Resp 18 | Wt 191.8 lb

## 2020-04-09 DIAGNOSIS — C49A4 Gastrointestinal stromal tumor of large intestine: Secondary | ICD-10-CM

## 2020-04-09 DIAGNOSIS — R197 Diarrhea, unspecified: Secondary | ICD-10-CM | POA: Diagnosis not present

## 2020-04-09 DIAGNOSIS — Z86010 Personal history of colonic polyps: Secondary | ICD-10-CM | POA: Diagnosis not present

## 2020-04-09 DIAGNOSIS — Z862 Personal history of diseases of the blood and blood-forming organs and certain disorders involving the immune mechanism: Secondary | ICD-10-CM | POA: Insufficient documentation

## 2020-04-09 DIAGNOSIS — C49A5 Gastrointestinal stromal tumor of rectum: Secondary | ICD-10-CM | POA: Insufficient documentation

## 2020-04-09 DIAGNOSIS — Z86711 Personal history of pulmonary embolism: Secondary | ICD-10-CM | POA: Insufficient documentation

## 2020-04-09 LAB — CMP (CANCER CENTER ONLY)
ALT: 7 U/L (ref 0–44)
AST: 12 U/L — ABNORMAL LOW (ref 15–41)
Albumin: 3.3 g/dL — ABNORMAL LOW (ref 3.5–5.0)
Alkaline Phosphatase: 124 U/L (ref 38–126)
Anion gap: 7 (ref 5–15)
BUN: 12 mg/dL (ref 8–23)
CO2: 23 mmol/L (ref 22–32)
Calcium: 8.8 mg/dL — ABNORMAL LOW (ref 8.9–10.3)
Chloride: 112 mmol/L — ABNORMAL HIGH (ref 98–111)
Creatinine: 1.25 mg/dL — ABNORMAL HIGH (ref 0.44–1.00)
GFR, Estimated: 46 mL/min — ABNORMAL LOW (ref >60)
Glucose, Bld: 103 mg/dL — ABNORMAL HIGH (ref 70–99)
Potassium: 3.7 mmol/L (ref 3.5–5.1)
Sodium: 142 mmol/L (ref 135–145)
Total Bilirubin: 0.9 mg/dL (ref 0.3–1.2)
Total Protein: 7.7 g/dL (ref 6.5–8.1)

## 2020-04-09 LAB — CBC WITH DIFFERENTIAL (CANCER CENTER ONLY)
Abs Immature Granulocytes: 0.01 10*3/uL (ref 0.00–0.07)
Basophils Absolute: 0 10*3/uL (ref 0.0–0.1)
Basophils Relative: 1 %
Eosinophils Absolute: 0.1 10*3/uL (ref 0.0–0.5)
Eosinophils Relative: 1 %
HCT: 35.6 % — ABNORMAL LOW (ref 36.0–46.0)
Hemoglobin: 11.7 g/dL — ABNORMAL LOW (ref 12.0–15.0)
Immature Granulocytes: 0 %
Lymphocytes Relative: 24 %
Lymphs Abs: 1.7 10*3/uL (ref 0.7–4.0)
MCH: 28.6 pg (ref 26.0–34.0)
MCHC: 32.9 g/dL (ref 30.0–36.0)
MCV: 87 fL (ref 80.0–100.0)
Monocytes Absolute: 0.3 10*3/uL (ref 0.1–1.0)
Monocytes Relative: 5 %
Neutro Abs: 4.8 10*3/uL (ref 1.7–7.7)
Neutrophils Relative %: 69 %
Platelet Count: 331 10*3/uL (ref 150–400)
RBC: 4.09 MIL/uL (ref 3.87–5.11)
RDW: 14.6 % (ref 11.5–15.5)
WBC Count: 7 10*3/uL (ref 4.0–10.5)
nRBC: 0 % (ref 0.0–0.2)

## 2020-04-09 LAB — MAGNESIUM: Magnesium: 1.6 mg/dL — ABNORMAL LOW (ref 1.7–2.4)

## 2020-04-09 NOTE — Progress Notes (Signed)
[facility] OFFICE PROGRESS NOTE   Diagnosis: Gastrointestinal stromal tumor  INTERVAL HISTORY:   Brianna Walker returns as scheduled. She continues Gleevec.  She feels well.  No nausea or vomiting.  No mouth sores.  She has occasional diarrhea after eating certain foods.  No rash.  She denies fever, cough, shortness of breath.  No abdominal pain.  Objective:  Vital signs in last 24 hours:  Blood pressure 138/75, pulse 90, temperature 98.3 F (36.8 C), temperature source Temporal, resp. rate 18, weight 191 lb 12.8 oz (87 kg), SpO2 100 %.    HEENT: No thrush or ulcers. Resp: Lungs clear bilaterally. Cardio: Regular rate and rhythm. GI: Abdomen soft and nontender.  No hepatomegaly.  Right mid to lower abdomen hernia. Vascular: No leg edema.  Skin: No rash.   Lab Results:  Lab Results  Component Value Date   WBC 7.0 04/09/2020   HGB 11.7 (L) 04/09/2020   HCT 35.6 (L) 04/09/2020   MCV 87.0 04/09/2020   PLT 331 04/09/2020   NEUTROABS 4.8 04/09/2020    Imaging:  No results found.  Medications: I have reviewed the patient's current medications.  Assessment/Plan: 1.Gastrointestinal stromal tumor of the rectum  Status post an endoscopic ultrasound on 08/06/2014 confirming a mass abutting the distal rectal wall with an FNA biopsy confirming a gastrointestinal stromal tumor  Initiation of Gleevec 09/04/2014  MRI pelvis 12/15/2014 with interval decreased size of the large anorectal mass with central necrosis.  Continuation of Gleevec  Gleevec placed on hold 06/02/2015 in anticipation of surgery  06/04/2015 status post low anterior resection and diverting loop ileostomy, resection of tumor  Pathology: 9.5 cm tumor; GISTsubtype spindle; mitotic rate 1/50 high-power field; positive margin of resection  Adjuvant Gleevec 08/19/2015 (three-year in total course planned)  Ileostomy takedown 01/28/2016  Held 01/28/2016 through 02/14/2016, resumed  02/15/2016  Gleevec stop 04/11/2016-04/18/2016, and 04/26/2016-05/08/2016  Gleevec resumed at a dose of 200 mg daily on 05/09/2016  Gleevec dose increased to 300 mg daily beginning 06/20/2016  Gleevec dose decreased to 200 mg daily beginning 06/23/2016 (poor tolerance at 300 mg dose)  CTs 11/22/2017-enhancing soft tissue mass in the presacral space measuring 5.4 x 3.5 cm. Enlarged pelvic lymph nodes.  Gleevec continued at a dose of 200 mg daily  Restaging CTs 03/11/2018-interval decrease in size of the presacral soft tissue mass. Stable small pelvic lymph nodes.  Gleevec discontinued 03/13/2018  CT 11/14/2018-new 2.4 cm left pelvic nodule at the piriformis muscle, stable presacral lymph nodes, right abdominal wall hernia, ventral hernia  Gleevec 200 mg daily 12/04/2018  CTs11/30/2020-decrease in size of soft tissue nodule deep left pelvis. 5 nodules in the sigmoid mesocolon stable to slightly larger. Stable presacral soft tissue thickening. No findings for new metastatic disease.  CT abdomen/pelvis 09/01/2019-progressive presacral and left perirectal nodularity. Additional mesenteric nodules along the sigmoid mesocolon and left pelvis unchanged.  Gleevec dose escalated to 300 mg daily 09/02/2019  Gleevec dose escalated to 400 mg alternating with 300 mg beginning 09/16/2019  Gleevec 300 mg daily beginning 09/30/2019 (unable to tolerate 400 mg due to diarrhea)  CT 12/15/2019-pelvic/peritoneal nodularity-unchanged 2. Remote history of pulmonary embolism-maintained on apixaban 3. Multiple colon polyps noted on the colonoscopy 07/21/2014 with the pathology revealing tubular adenomas 4. Hospitalization 06/21/2015 through 07/06/2015 with nausea/vomiting, high ileostomy output; found to have delayed gastric emptying. 5. Water soluble contrast enema 08/13/2015 positive for a small to moderate volume of water soluble contrast leakage from the low anterior resection and anastomosis site in  the pelvis 6. Ileostomy takedown and resection of cystic abdominal wall mass ( benign pathology) on 01/28/2016 7. Anemia with ferritin in the low normal range 05/23/2016 and 06/20/2016. Ferrous sulfate increased to twice daily 06/26/2016.Improved.   Disposition: Brianna Walker appears well.  There is no clinical evidence of disease progression.  She will continue Gleevec.  Restaging CTs prior to next visit.  We reviewed the CBC and chemistry panel from today.  Labs adequate to continue with Gleevec.  Magnesium is mildly low.  She does not want to take a magnesium supplement.  We discussed magnesium rich foods.  She will return for lab and follow-up in approximately 6 weeks, CTs a few days prior.  She will contact the office in the interim with any problems.      Ned Card ANP/GNP-BC   04/09/2020  1:07 PM

## 2020-04-12 MED FILL — IMATINIB MESYLATE 100 MG TA: 100 | 30 days supply | Qty: 90 | Fill #0

## 2020-05-03 ENCOUNTER — Telehealth: Payer: Self-pay | Admitting: Oncology

## 2020-05-03 NOTE — Telephone Encounter (Signed)
Rescheduled appointments on 1/27. Spoke to patient who is aware of updated appointments date and time.

## 2020-05-06 ENCOUNTER — Other Ambulatory Visit: Payer: Self-pay | Admitting: Oncology

## 2020-05-10 MED FILL — IMATINIB MESYLATE 100 MG TA: 100 | 30 days supply | Qty: 90 | Fill #0

## 2020-05-27 ENCOUNTER — Other Ambulatory Visit: Payer: Medicare HMO

## 2020-05-27 ENCOUNTER — Ambulatory Visit: Payer: Medicare HMO | Admitting: Oncology

## 2020-05-28 ENCOUNTER — Inpatient Hospital Stay: Payer: Medicare Other | Admitting: Nurse Practitioner

## 2020-05-28 ENCOUNTER — Inpatient Hospital Stay: Payer: Medicare Other

## 2020-06-01 ENCOUNTER — Inpatient Hospital Stay: Payer: Medicare HMO | Attending: Oncology

## 2020-06-01 ENCOUNTER — Encounter (HOSPITAL_COMMUNITY): Payer: Self-pay

## 2020-06-01 ENCOUNTER — Other Ambulatory Visit: Payer: Self-pay

## 2020-06-01 ENCOUNTER — Ambulatory Visit (HOSPITAL_COMMUNITY)
Admission: RE | Admit: 2020-06-01 | Discharge: 2020-06-01 | Disposition: A | Discharge status: Home / Self Care | Payer: Medicaid Other | Source: Ambulatory Visit | Attending: Nurse Practitioner | Admitting: Nurse Practitioner

## 2020-06-01 DIAGNOSIS — Z86711 Personal history of pulmonary embolism: Secondary | ICD-10-CM | POA: Insufficient documentation

## 2020-06-01 DIAGNOSIS — Z862 Personal history of diseases of the blood and blood-forming organs and certain disorders involving the immune mechanism: Secondary | ICD-10-CM | POA: Diagnosis not present

## 2020-06-01 DIAGNOSIS — Z86010 Personal history of colonic polyps: Secondary | ICD-10-CM | POA: Diagnosis not present

## 2020-06-01 DIAGNOSIS — C49A4 Gastrointestinal stromal tumor of large intestine: Secondary | ICD-10-CM

## 2020-06-01 DIAGNOSIS — C49A5 Gastrointestinal stromal tumor of rectum: Secondary | ICD-10-CM | POA: Diagnosis not present

## 2020-06-01 LAB — CMP (CANCER CENTER ONLY)
ALT: 6 U/L (ref 0–44)
AST: 12 U/L — ABNORMAL LOW (ref 15–41)
Albumin: 3.5 g/dL (ref 3.5–5.0)
Alkaline Phosphatase: 117 U/L (ref 38–126)
Anion gap: 7 (ref 5–15)
BUN: 14 mg/dL (ref 8–23)
CO2: 25 mmol/L (ref 22–32)
Calcium: 8.7 mg/dL — ABNORMAL LOW (ref 8.9–10.3)
Chloride: 110 mmol/L (ref 98–111)
Creatinine: 1.21 mg/dL — ABNORMAL HIGH (ref 0.44–1.00)
GFR, Estimated: 48 mL/min — ABNORMAL LOW (ref >60)
Glucose, Bld: 103 mg/dL — ABNORMAL HIGH (ref 70–99)
Potassium: 3.9 mmol/L (ref 3.5–5.1)
Sodium: 142 mmol/L (ref 135–145)
Total Bilirubin: 0.6 mg/dL (ref 0.3–1.2)
Total Protein: 7.7 g/dL (ref 6.5–8.1)

## 2020-06-01 LAB — CBC WITH DIFFERENTIAL (CANCER CENTER ONLY)
Abs Immature Granulocytes: 0.03 10*3/uL (ref 0.00–0.07)
Basophils Absolute: 0 10*3/uL (ref 0.0–0.1)
Basophils Relative: 0 %
Eosinophils Absolute: 0.1 10*3/uL (ref 0.0–0.5)
Eosinophils Relative: 1 %
HCT: 37.9 % (ref 36.0–46.0)
Hemoglobin: 12.2 g/dL (ref 12.0–15.0)
Immature Granulocytes: 0 %
Lymphocytes Relative: 23 %
Lymphs Abs: 1.6 10*3/uL (ref 0.7–4.0)
MCH: 28.3 pg (ref 26.0–34.0)
MCHC: 32.2 g/dL (ref 30.0–36.0)
MCV: 87.9 fL (ref 80.0–100.0)
Monocytes Absolute: 0.3 10*3/uL (ref 0.1–1.0)
Monocytes Relative: 4 %
Neutro Abs: 4.9 10*3/uL (ref 1.7–7.7)
Neutrophils Relative %: 72 %
Platelet Count: 326 10*3/uL (ref 150–400)
RBC: 4.31 MIL/uL (ref 3.87–5.11)
RDW: 14 % (ref 11.5–15.5)
WBC Count: 7 10*3/uL (ref 4.0–10.5)
nRBC: 0 % (ref 0.0–0.2)

## 2020-06-01 LAB — MAGNESIUM: Magnesium: 1.7 mg/dL (ref 1.7–2.4)

## 2020-06-01 MED ORDER — IOHEXOL 9 MG/ML PO SOLN
ORAL | Status: AC
  Filled 2020-06-01: qty 1000

## 2020-06-01 MED ORDER — IOHEXOL 9 MG/ML PO SOLN
1000.0000 mL | ORAL | Status: AC
  Administered 2020-06-01: 1000 mL via ORAL

## 2020-06-01 MED ORDER — IOHEXOL 9 MG/ML PO SOLN: 500.0000 mL | ORAL | Status: DC

## 2020-06-02 ENCOUNTER — Ambulatory Visit (HOSPITAL_COMMUNITY)
Admission: RE | Admit: 2020-06-02 | Discharge: 2020-06-02 | Disposition: A | Discharge status: Home / Self Care | Payer: Medicare HMO | Source: Ambulatory Visit | Attending: Nurse Practitioner | Admitting: Nurse Practitioner

## 2020-06-02 DIAGNOSIS — C49A4 Gastrointestinal stromal tumor of large intestine: Secondary | ICD-10-CM | POA: Insufficient documentation

## 2020-06-02 DIAGNOSIS — N8110 Cystocele, unspecified: Secondary | ICD-10-CM | POA: Diagnosis not present

## 2020-06-02 DIAGNOSIS — M1612 Unilateral primary osteoarthritis, left hip: Secondary | ICD-10-CM | POA: Diagnosis not present

## 2020-06-02 DIAGNOSIS — K449 Diaphragmatic hernia without obstruction or gangrene: Secondary | ICD-10-CM | POA: Diagnosis not present

## 2020-06-02 DIAGNOSIS — C49A0 Gastrointestinal stromal tumor, unspecified site: Secondary | ICD-10-CM | POA: Diagnosis not present

## 2020-06-02 MED ORDER — IOHEXOL 9 MG/ML PO SOLN
1000.0000 mL | ORAL | Status: AC
  Administered 2020-06-02: 1000 mL via ORAL

## 2020-06-02 MED ORDER — IOHEXOL 9 MG/ML PO SOLN
ORAL | Status: AC
  Filled 2020-06-02: qty 1000

## 2020-06-04 ENCOUNTER — Inpatient Hospital Stay: Payer: Medicare HMO | Admitting: Nurse Practitioner

## 2020-06-04 ENCOUNTER — Telehealth: Payer: Self-pay | Admitting: Nurse Practitioner

## 2020-06-04 NOTE — Telephone Encounter (Signed)
R/s appt from 2/9 to a day and time GBS is in office. Unable to reach pt . Left message with appt date and time.

## 2020-06-07 MED FILL — IMATINIB MESYLATE 100 MG TA: 100 | 30 days supply | Qty: 90 | Fill #1

## 2020-06-08 ENCOUNTER — Other Ambulatory Visit: Payer: Self-pay | Admitting: Internal Medicine

## 2020-06-08 ENCOUNTER — Telehealth: Payer: Self-pay | Admitting: Internal Medicine

## 2020-06-08 NOTE — Telephone Encounter (Signed)
    Patient requesting refill for benazepril (LOTENSIN) 40 MG tablet, ELIQUIS 5 MG TABS tablet and estradiol (VIVELLE-DOT) 0.025 MG/24HR be sent to [facility]

## 2020-06-08 NOTE — Telephone Encounter (Signed)
Please call pt - ok to send 1 mo refills lotensin, eliquis, vivelle and prilosec to a local pharmacy of her choice  Needs ROV for further refills please

## 2020-06-09 ENCOUNTER — Other Ambulatory Visit: Payer: Self-pay

## 2020-06-09 ENCOUNTER — Ambulatory Visit: Payer: Medicare Other | Admitting: Nurse Practitioner

## 2020-06-09 MED ORDER — APIXABAN 5 MG PO TABS: ORAL_TABLET | ORAL | 0 refills | Status: DC

## 2020-06-09 MED ORDER — BENAZEPRIL HCL 40 MG PO TABS: ORAL_TABLET | ORAL | 0 refills | Status: DC

## 2020-06-09 MED ORDER — ESTRADIOL 0.025 MG/24HR TD PTTW: 1.0000 | MEDICATED_PATCH | TRANSDERMAL | 3 refills | Status: DC

## 2020-06-10 ENCOUNTER — Inpatient Hospital Stay (HOSPITAL_BASED_OUTPATIENT_CLINIC_OR_DEPARTMENT_OTHER): Payer: Medicare HMO | Admitting: Nurse Practitioner

## 2020-06-10 ENCOUNTER — Encounter: Payer: Self-pay | Admitting: Nurse Practitioner

## 2020-06-10 ENCOUNTER — Other Ambulatory Visit: Payer: Self-pay

## 2020-06-10 VITALS — BP 151/72 | HR 85 | Temp 97.0°F | Resp 18 | Ht 64.0 in | Wt 185.5 lb

## 2020-06-10 DIAGNOSIS — C49A4 Gastrointestinal stromal tumor of large intestine: Secondary | ICD-10-CM

## 2020-06-10 DIAGNOSIS — Z86010 Personal history of colonic polyps: Secondary | ICD-10-CM | POA: Diagnosis not present

## 2020-06-10 DIAGNOSIS — C49A5 Gastrointestinal stromal tumor of rectum: Secondary | ICD-10-CM | POA: Diagnosis not present

## 2020-06-10 DIAGNOSIS — Z862 Personal history of diseases of the blood and blood-forming organs and certain disorders involving the immune mechanism: Secondary | ICD-10-CM | POA: Diagnosis not present

## 2020-06-10 DIAGNOSIS — Z86711 Personal history of pulmonary embolism: Secondary | ICD-10-CM | POA: Diagnosis not present

## 2020-06-10 NOTE — Progress Notes (Signed)
[place] OFFICE PROGRESS NOTE   Diagnosis: Gastrointestinal stromal tumor  INTERVAL HISTORY:   Brianna Walker returns for follow-up. She continues Gleevec.  She has occasional loose stools.  No nausea or vomiting.  No rash except beneath the breasts.  She describes Brianna appetite as "alright".  No fever, cough, shortness of breath.  Objective:  Vital signs in last 24 hours:  Blood pressure (!) 151/72, pulse 85, temperature (!) 97 F (36.1 C), temperature source Tympanic, resp. rate 18, height 5\' 4"  (1.626 m), weight 185 lb 8 oz (84.1 kg), SpO2 99 %.    HEENT: No thrush or ulcers. Resp: Lungs clear bilaterally. Cardio: Regular rate and rhythm. GI: Abdomen soft and nontender.  No hepatomegaly.  Hernia right mid to lower abdomen. Vascular: No leg edema. Skin: No rash.   Lab Results:  Lab Results  Component Value Date   WBC 7.0 06/01/2020   HGB 12.2 06/01/2020   HCT 37.9 06/01/2020   MCV 87.9 06/01/2020   PLT 326 06/01/2020   NEUTROABS 4.9 06/01/2020    Imaging:  No results found.  Medications: I have reviewed the patient's current medications.  Assessment/Plan: 1.Gastrointestinal stromal tumor of the rectum  Status post an endoscopic ultrasound on 08/06/2014 confirming a mass abutting the distal rectal wall with an FNA biopsy confirming a gastrointestinal stromal tumor  Initiation of Gleevec 09/04/2014  MRI pelvis 12/15/2014 with interval decreased size of the large anorectal mass with central necrosis.  Continuation of Gleevec  Gleevec placed on hold 06/02/2015 in anticipation of surgery  06/04/2015 status post low anterior resection and diverting loop ileostomy, resection of tumor  Pathology: 9.5 cm tumor; GISTsubtype spindle; mitotic rate 1/50 high-power field; positive margin of resection  Adjuvant Gleevec 08/19/2015 (three-year in total course planned)  Ileostomy takedown 01/28/2016  Held 01/28/2016 through 02/14/2016, resumed  02/15/2016  Gleevec stop 04/11/2016-04/18/2016, and 04/26/2016-05/08/2016  Gleevec resumed at a dose of 200 mg daily on 05/09/2016  Gleevec dose increased to 300 mg daily beginning 06/20/2016  Gleevec dose decreased to 200 mg daily beginning 06/23/2016 (poor tolerance at 300 mg dose)  CTs 11/22/2017-enhancing soft tissue mass in the presacral space measuring 5.4 x 3.5 cm. Enlarged pelvic lymph nodes.  Gleevec continued at a dose of 200 mg daily  Restaging CTs 03/11/2018-interval decrease in size of the presacral soft tissue mass. Stable small pelvic lymph nodes.  Gleevec discontinued 03/13/2018  CT 11/14/2018-new 2.4 cm left pelvic nodule at the piriformis muscle, stable presacral lymph nodes, right abdominal wall hernia, ventral hernia  Gleevec 200 mg daily 12/04/2018  CTs11/30/2020-decrease in size of soft tissue nodule deep left pelvis. 5 nodules in the sigmoid mesocolon stable to slightly larger. Stable presacral soft tissue thickening. No findings for new metastatic disease.  CT abdomen/pelvis 09/01/2019-progressive presacral and left perirectal nodularity. Additional mesenteric nodules along the sigmoid mesocolon and left pelvis unchanged.  Gleevec dose escalated to 300 mg daily 09/02/2019  Gleevec dose escalated to 400 mg alternating with 300 mg beginning 09/16/2019  Gleevec 300 mg daily beginning 09/30/2019 (unable to tolerate 400 mg due to diarrhea)  CT 12/15/2019-pelvic/peritoneal nodularity-unchanged  CT 06/02/2020-mildly worsened soft tissue density between the lower rectum and the sacrum. Mildly worsened superior anorectal adenopathy and perirectal adenopathy.  Office review of CT consistent with stable disease  Gleevec 300 mg daily continued 2. Remote history of pulmonary embolism-maintained on apixaban 3. Multiple colon polyps noted on the colonoscopy 07/21/2014 with the pathology revealing tubular adenomas 4. Hospitalization 06/21/2015 through 07/06/2015 with  nausea/vomiting, high ileostomy output; found to have delayed gastric emptying. 5. Water soluble contrast enema 08/13/2015 positive for a small to moderate volume of water soluble contrast leakage from the low anterior resection and anastomosis site in the pelvis 6. Ileostomy takedown and resection of cystic abdominal wall mass ( benign pathology) on 01/28/2016 7. Anemia with ferritin in the low normal range 05/23/2016 and 06/20/2016. Ferrous sulfate increased to twice daily 06/26/2016.Improved.    Disposition: [name] appears unchanged.  Dr. Benay Spice reviewed the recent CT report/images with Brianna Walker and Brianna Walker at today's visit.  There does not appear to be significant change.  She will continue Gleevec at the current dose of 300 mg daily.  Next restaging CTs at a 4 to 87-month interval.  She will return for lab and follow-up in 6 weeks.  We are available to see Brianna sooner if needed.  Patient seen with Dr. Benay Spice.    Ned Card ANP/GNP-BC   06/10/2020  4:00 PM This was a shared visit with Ned Card.  We reviewed the CT findings and images with Brianna Walker.  The peritoneal nodules do not appear significantly changed compared to the most recent CT.  Brianna Walker does not wish to consider dose escalation of Gleevec.  The plan is to continue Gleevec at the current dose.  I was present for greater than 50% of today's visit.  I performed medical decision making.  Julieanne Manson, MD

## 2020-06-11 ENCOUNTER — Telehealth: Payer: Self-pay | Admitting: Nurse Practitioner

## 2020-06-11 NOTE — Telephone Encounter (Signed)
Scheduled appointments per 2/10 los. Spoke to patient who is aware of appointments date and times.

## 2020-06-14 ENCOUNTER — Ambulatory Visit (INDEPENDENT_AMBULATORY_CARE_PROVIDER_SITE_OTHER): Payer: Medicare HMO | Admitting: Internal Medicine

## 2020-06-14 ENCOUNTER — Other Ambulatory Visit: Payer: Self-pay

## 2020-06-14 ENCOUNTER — Encounter: Payer: Self-pay | Admitting: Internal Medicine

## 2020-06-14 ENCOUNTER — Telehealth: Payer: Self-pay | Admitting: Internal Medicine

## 2020-06-14 VITALS — BP 138/82 | HR 82 | Temp 98.3°F | Ht 64.0 in | Wt 186.0 lb

## 2020-06-14 DIAGNOSIS — N3281 Overactive bladder: Secondary | ICD-10-CM

## 2020-06-14 DIAGNOSIS — E7849 Other hyperlipidemia: Secondary | ICD-10-CM | POA: Diagnosis not present

## 2020-06-14 DIAGNOSIS — Z0001 Encounter for general adult medical examination with abnormal findings: Secondary | ICD-10-CM | POA: Diagnosis not present

## 2020-06-14 DIAGNOSIS — I700 Atherosclerosis of aorta: Secondary | ICD-10-CM | POA: Diagnosis not present

## 2020-06-14 DIAGNOSIS — I2510 Atherosclerotic heart disease of native coronary artery without angina pectoris: Secondary | ICD-10-CM

## 2020-06-14 DIAGNOSIS — R739 Hyperglycemia, unspecified: Secondary | ICD-10-CM

## 2020-06-14 MED ORDER — SOLIFENACIN SUCCINATE 5 MG PO TABS
5.0000 mg | ORAL_TABLET | Freq: Every day | ORAL | 3 refills | Status: DC
Start: 2020-06-14 — End: 2021-06-24

## 2020-06-14 MED ORDER — OMEPRAZOLE 40 MG PO CPDR: 40.0000 mg | DELAYED_RELEASE_CAPSULE | Freq: Every day | ORAL | 3 refills | Status: DC

## 2020-06-14 MED ORDER — ATORVASTATIN CALCIUM 40 MG PO TABS
40.0000 mg | ORAL_TABLET | Freq: Every day | ORAL | 3 refills | Status: DC
Start: 2020-06-14 — End: 2021-06-24

## 2020-06-14 MED ORDER — OMEPRAZOLE 20 MG PO CPDR: DELAYED_RELEASE_CAPSULE | ORAL | 0 refills | Status: DC

## 2020-06-14 NOTE — Assessment & Plan Note (Signed)
For low chol diet, and lipitor as above

## 2020-06-14 NOTE — Assessment & Plan Note (Signed)

## 2020-06-14 NOTE — Telephone Encounter (Signed)
1.Medication Requested: omeprazole (PRILOSEC) 20 MG capsule    2. Pharmacy (Name, Street, Axis): [facility], [address]  3. On Med List: yes   4. Last Visit with PCP: 2.17.21  5. Next visit date with PCP: 2.14.22  Pharmacy called and was wondering if a short supply of the medication could be sent to the local pharmacy and it be increased to 40mg .Please advise    Agent: Please be advised that RX refills may take up to 3 business days. We ask that you follow-up with your pharmacy.

## 2020-06-14 NOTE — Patient Instructions (Signed)
Since the goal of the LDL cholesterol is less than 70, we should change the pravastatin to lipitor 40 mg per day  Ok to increase the omeprazole to 40 mg per day  Please take all new medication as prescribed - the generic for vesicare 5 mg per day for bladder  Please continue all other medications as before, and refills have been done if requested.  Please have the pharmacy call with any other refills you may need.  Please continue your efforts at being more active, low cholesterol diet, and weight control.  You are otherwise up to date with prevention measures today.  Please keep your appointments with your specialists as you may have planned  Please make an Appointment to return in 6 months, or sooner if needed

## 2020-06-14 NOTE — Assessment & Plan Note (Signed)
With atherosclerosis by ct - for change pravastatin to lipitor 40 qd, and f/u lab next visit, goal ldl < 70 Lab Results  Component Value Date   [name] 95 04/15/2018

## 2020-06-14 NOTE — Assessment & Plan Note (Signed)
With persistent now chronic life style limiting symptoms- for vesicare 5 qd

## 2020-06-14 NOTE — Progress Notes (Signed)
Patient ID: Brianna Walker, female   DOB: 02-03-1946, 75 y.o.   MRN: [id number]         Chief Complaint:: wellness exam and Follow-up and Medication Refill  and CAD by CT, aortic atherosclerosis, GERD, HLD, and urinary frequency       HPI:  Brianna Walker is a 75 y.o. female here for wellness exam, up to date today, declines labs today as just had labs recently at cancer center   Wt Readings from Last 3 Encounters:  06/14/20 186 lb (84.4 kg)  06/10/20 185 lb 8 oz (84.1 kg)  04/09/20 191 lb 12.8 oz (87 kg)   BP Readings from Last 3 Encounters:  06/14/20 138/82  06/10/20 (!) 151/72  04/09/20 138/75   Immunization History  Administered Date(s) Administered  . Fluad Quad(high Dose 65+) 01/30/2019, 03/23/2020  . Influenza Split 04/05/2011  . Influenza Whole 03/16/2010  . Influenza, High Dose Seasonal PF 03/08/2017  . Influenza,inj,Quad PF,6+ Mos 12/27/2012, 02/11/2014, 05/05/2015, 02/15/2016, 01/11/2018  . PFIZER(Purple Top)SARS-COV-2 Vaccination 06/05/2019, 06/26/2019, 01/09/2020  . Pneumococcal Conjugate-13 03/05/2014  . Pneumococcal Polysaccharide-23 05/01/2005, 01/22/2013  . Td 09/15/2008  There are no preventive care reminders to display for this patient.       Also had recent CT abd with incidental findings of CAD by CT, and atherosclerosis.  Also with worsening reflux symptoms despite omeprazole 20 mg and antireflux precautions.  Denies other abd pain, dysphagia, n/v, bowel change or blood.  Denies urinary symptoms such as dysuria, flank pain, hematuria or n/v, fever, chills, but has urinary urgency and frequency with nightly 3 times nocturia "like clockwork" every night for > 3 mo.    Past Medical History:  Diagnosis Date  . AKI (acute kidney injury) ([place]) 06/21/2015  . ALLERGIC RHINITIS 09/12/2007  . Arthritis   . BACK PAIN 06/19/2008  . Cramp of limb 08/11/2009  . DEEP VENOUS THROMBOPHLEBITIS, LEG, RIGHT 03/04/2010   right leg  . Diverticulosis   . FREQUENCY,  URINARY 10/07/2009  . GERD 12/05/2006  . gist    dx. 4'16 -oral chemotherapy only.  Marland Kitchen HEMORRHOIDS 11/17/2006  . HIP PAIN, RIGHT, CHRONIC 08/11/2009  . HX, PERSONAL, TUBERCULOSIS 11/17/2006  . HYPERLIPIDEMIA 09/12/2007  . Hyperlipidemia 09/12/2007   Qualifier: Diagnosis of  By: Jenny Reichmann MD, Hunt Oris   . HYPERTENSION 11/17/2006  . INSOMNIA-SLEEP DISORDER-UNSPEC 06/19/2008  . Iron deficiency anemia 12/07/2011  . LEG PAIN, RIGHT 06/19/2008  . Long term (current) use of anticoagulants 11/29/2010  . MASS, SUPERFICIAL 09/12/2007  . MENOPAUSAL DISORDER 09/20/2009  . OVERACTIVE BLADDER 03/04/2010  . Perforation of colon ([place]) 11/17/2015  . Pneumonia   . PULMONARY EMBOLISM, HX OF 11/17/2006   [facility] s/p Pneumonia  . RECTAL BLEEDING 10/07/2007  . Rectal mass 06/2014  . SCIATICA, RIGHT 09/12/2007  . Shortness of breath dyspnea    With exertion since chemotherapy tx-oral meds  . SKIN LESION 07/20/2009  . TMJ SYNDROME 11/17/2006  . Tuberculosis    pt. was tx.  . Tubular adenoma of colon 2016   Past Surgical History:  Procedure Laterality Date  . ABDOMINAL HYSTERECTOMY    . CHOLECYSTECTOMY OPEN    . COLONOSCOPY    . EUS N/A 08/06/2014   Procedure: LOWER ENDOSCOPIC ULTRASOUND (EUS);  Surgeon: Milus Banister, MD;  Location: [facility];  Service: Endoscopy;  Laterality: N/A;  . EXCISION MASS ABDOMINAL N/A 01/28/2016   Procedure: EXCISION  ABDOMINAL WALL MASS, CYST, QUESTION FAT NECROSIS, QUESTION SEROMA;  Surgeon: Remo Lipps  Gross, MD;  Location: [facility];  Service: General;  Laterality: N/A;  . hemorroid surgery  removed at dr Silvio Pate office   x 2  . ILEOSTOMY CLOSURE N/A 01/28/2016   Procedure: TAKEDOWN OF LOOP ILEOSTOMY;  Surgeon: Michael Boston, MD;  Location: [facility];  Service: General;  Laterality: N/A;  . [place]  2002     multiple ventral hernia repair/mesh  . INCISIONAL HERNIA REPAIR  01/13/2005   open w onlay Proceed mesh.  Marland Kitchen LAPAROSCOPIC LYSIS OF ADHESIONS  06/04/2015    Procedure: LAPAROSCOPIC LYSIS OF ADHESIONS;  Surgeon: Michael Boston, MD;  Location: [facility];  Service: General;;  . PROCTOSCOPY N/A 01/28/2016   Procedure: RIGID PROCTOSCOPY, DILATION OF COLORECTAL ANASTOMOTIC STRICTURE;  Surgeon: Michael Boston, MD;  Location: [facility];  Service: General;  Laterality: N/A;  . s/p right hip replaced min invasive hip surgury [facility] oct 2011 Right 2011  . tumor removed off of right shoulder Right   . XI ROBOTIC ASSISTED LOWER ANTERIOR RESECTION N/A 06/04/2015   Procedure: XI ROBOTIC ASSISTED LYSIS OF ADHESIONS, LOWER ANTERIOR RESECTION TRANS ABDOMINAL AND TRANS ANAL, COLOANAL HANDSEWN ANASTAOSIS, DIVERTING LOPE ILIOSTOMY, RESECTION GIST TUMOR;  Surgeon: Michael Boston, MD;  Location: [facility];  Service: General;  Laterality: N/A;    reports that she quit smoking about 43 years ago. Her smoking use included cigarettes. She smoked 0.00 packs per day. She has never used smokeless tobacco. She reports current alcohol use. She reports that she does not use drugs. family history includes Colon cancer in her father; Diabetes in her sister; Heart disease in her mother and sister. No Known Allergies Current Outpatient Medications on File Prior to Visit  Medication Sig Dispense Refill  . acetaminophen (TYLENOL) 500 MG tablet Take 1,000 mg by mouth every 6 (six) hours as needed for moderate pain or headache.    Marland Kitchen apixaban (ELIQUIS) 5 MG TABS tablet TAKE 1 TABLET TWICE DAILY (NEED MD APPOINTMENT) 180 tablet 0  . benazepril (LOTENSIN) 40 MG tablet TAKE 1 TABLET EVERY EVENING (ANNUAL APPOINTMENT IS DUE, MUST SEE PROVIDER FOR FUTURE REFILLS) 90 tablet 0  . diphenoxylate-atropine (LOMOTIL) 2.5-0.025 MG tablet Take 1-2 tablets by mouth 4 (four) times daily as needed for diarrhea or loose stools. Maximum 8/day 60 tablet 0  . estradiol (VIVELLE-DOT) 0.025 MG/24HR Place 1 patch onto the skin 2 (two) times a week. 24 patch 3  . ferrous sulfate 325 (65 FE) MG tablet Take 1 tablet (325 mg total) by  mouth 2 (two) times daily with a meal. 180 tablet 3  . imatinib (GLEEVEC) 100 MG tablet TAKE 3 TABLETS (300 MG TOTAL) BY MOUTH DAILY. PLEASE CALL OFFICE TO MAKE UP MISSED APPOINTMENT 90 tablet 1  . loperamide (IMODIUM) 2 MG capsule Take 1-2 capsules (2-4 mg total) by mouth every 8 (eight) hours as needed for diarrhea or loose stools (Use if >2 BM every 8 hours). 30 capsule 0  . NYSTATIN powder APPLY TOPICALLY THREE TIMES DAILY AS NEEDED 15 g 0  . ofloxacin (OCUFLOX) 0.3 % ophthalmic solution Place 1 drop into both eyes 3 (three) times daily. Taper down    . potassium chloride (MICRO-K) 10 MEQ CR capsule Take 2 capsules (20 mEq total) by mouth daily. 180 capsule 2  . prednisoLONE acetate (PRED FORTE) 1 % ophthalmic suspension Place 1 drop into both eyes 3 (three) times daily.    Marland Kitchen Propylhexedrine INHA Place 1 each into the nose daily as needed (congestion).    Marland Kitchen  Vitamin D, Ergocalciferol, (DRISDOL) 50000 units CAPS capsule Take 1 capsule (50,000 Units total) by mouth every 7 (seven) days. 12 capsule 0   No current facility-administered medications on file prior to visit.        ROS:  All others reviewed and negative.  Objective        PE:  BP 138/82   Pulse 82   Temp 98.3 F (36.8 C) (Oral)   Ht 5\' 4"  (1.626 m)   Wt 186 lb (84.4 kg)   SpO2 98%   BMI 31.93 kg/m                 Constitutional: Pt appears in NAD               HENT: Head: NCAT.                Right Ear: External ear normal.                 Left Ear: External ear normal.                Eyes: . Pupils are equal, round, and reactive to light. Conjunctivae and EOM are normal               Nose: without d/c or deformity               Neck: Neck supple. Gross normal ROM               Cardiovascular: Normal rate and regular rhythm.                 Pulmonary/Chest: Effort normal and breath sounds without rales or wheezing.                Abd:  Soft, NT, ND, + BS, no organomegaly               Neurological: Pt is alert. At  baseline orientation, motor grossly intact               Skin: Skin is warm. No rashes, no other new lesions, LE edema - trace bilat               Psychiatric: Pt behavior is normal without agitation   Micro: none  Cardiac tracings I have personally interpreted today:  none  Pertinent Radiological findings (summarize): CT abd/pelvis 06/02/2020 IMPRESSION: 1. Mildly worsened soft tissue density between the lower rectum and the sacrum, some of this density is likely treatment related although a component of tumor is likely given the other findings. Mildly worsened superior anorectal adenopathy and perirectal adenopathy. 2. Stable right paracentral hernia containing loops of small bowel, no strangulation or obstruction. 3. Mickeal Skinner and [place] type 1 IVC aneurysm, unchanged. In light of the patient's comorbidities, conservative for medical therapy might be favored, but if this condition has not been previously addressed than vascular specialist referral may be appropriate. 4. Other imaging findings of potential clinical significance: Coronary atherosclerosis. Small type 1 hiatal hernia. Pelvic floor laxity with small cystocele. Severe degenerative arthropathy left hip with right total hip prosthesis in place. Stable 2 by 3 mm right lower lobe nodule, likely benign. Faint peripheral reticulonodular interstitial accentuation in both lower lobes, unchanged. 5. Small hypodense renal lesions are likely cysts although enhancement characteristics are not interrogated today. 6. Aortic atherosclerosis.  Aortic Atherosclerosis (ICD10-I70.0).   Lab Results  Component Value Date   WBC 7.0 06/01/2020   HGB 12.2 06/01/2020  HCT 37.9 06/01/2020   PLT 326 06/01/2020   GLUCOSE 103 (H) 06/01/2020   CHOL 169 04/15/2018   TRIG 175.0 (H) 04/15/2018   HDL 38.80 (L) 04/15/2018   LDLDIRECT 49.0 09/14/2015   LDLCALC 95 04/15/2018   ALT 6 06/01/2020   AST 12 (L) 06/01/2020   NA 142 06/01/2020   K  3.9 06/01/2020   CL 110 06/01/2020   CREATININE 1.21 (H) 06/01/2020   BUN 14 06/01/2020   CO2 25 06/01/2020   TSH 0.09 (L) 04/15/2018   INR 0.94 01/26/2016   HGBA1C 5.7 04/15/2018   Assessment/Plan:  Brianna Walker is a 75 y.o. Black or African American [2] female with  has a past medical history of AKI (acute kidney injury) ([place]) (06/21/2015), ALLERGIC RHINITIS (09/12/2007), Arthritis, BACK PAIN (06/19/2008), Cramp of limb (08/11/2009), DEEP VENOUS THROMBOPHLEBITIS, LEG, RIGHT (03/04/2010), Diverticulosis, FREQUENCY, URINARY (10/07/2009), GERD (12/05/2006), gist, HEMORRHOIDS (11/17/2006), HIP PAIN, RIGHT, CHRONIC (08/11/2009), HX, PERSONAL, TUBERCULOSIS (11/17/2006), HYPERLIPIDEMIA (09/12/2007), Hyperlipidemia (09/12/2007), HYPERTENSION (11/17/2006), INSOMNIA-SLEEP DISORDER-UNSPEC (06/19/2008), Iron deficiency anemia (12/07/2011), LEG PAIN, RIGHT (06/19/2008), Long term (current) use of anticoagulants (11/29/2010), MASS, SUPERFICIAL (09/12/2007), MENOPAUSAL DISORDER (09/20/2009), OVERACTIVE BLADDER (03/04/2010), Perforation of colon ([place]) (11/17/2015), Pneumonia, PULMONARY EMBOLISM, HX OF (11/17/2006), RECTAL BLEEDING (10/07/2007), Rectal mass (06/2014), SCIATICA, RIGHT (09/12/2007), Shortness of breath dyspnea, SKIN LESION (07/20/2009), TMJ SYNDROME (11/17/2006), Tuberculosis, and Tubular adenoma of colon (2016).  Encounter for well adult exam with abnormal findings Age and sex appropriate education and counseling updated with regular exercise and diet Referrals for preventative services - none needed Immunizations addressed - none needed Smoking counseling  - none needed Evidence for depression or other mood disorder - none significant Most recent labs reviewed. I have personally reviewed and have noted: 1) the patient's medical and social history 2) The patient's current medications and supplements 3) The patient's height, weight, and BMI have been recorded in the chart   Hyperglycemia Lab Results  Component  Value Date   HGBA1C 5.7 04/15/2018   Stable, pt to continue current medical treatment  - diet   Hyperlipidemia With atherosclerosis by ct - for change pravastatin to lipitor 40 qd, and f/u lab next visit, goal ldl < 70 Lab Results  Component Value Date   [place] 95 04/15/2018    Aortic atherosclerosis (HCC) For low chol diet, and lipitor as above  Coronary artery calcification seen on CT scan For low chol diet, and lipitor as above  OAB (overactive bladder) With persistent now chronic life style limiting symptoms- for vesicare 5 qd  Followup: Return in about 6 months (around 12/12/2020).  Cathlean Cower, MD 06/14/2020 7:55 PM [facility]

## 2020-06-14 NOTE — Assessment & Plan Note (Signed)
Lab Results  Component Value Date   HGBA1C 5.7 04/15/2018   Stable, pt to continue current medical treatment - diet  

## 2020-06-15 ENCOUNTER — Other Ambulatory Visit: Payer: Self-pay

## 2020-06-15 DIAGNOSIS — H52209 Unspecified astigmatism, unspecified eye: Secondary | ICD-10-CM | POA: Diagnosis not present

## 2020-06-15 DIAGNOSIS — H5213 Myopia, bilateral: Secondary | ICD-10-CM | POA: Diagnosis not present

## 2020-06-15 DIAGNOSIS — H524 Presbyopia: Secondary | ICD-10-CM | POA: Diagnosis not present

## 2020-06-29 DIAGNOSIS — H3581 Retinal edema: Secondary | ICD-10-CM | POA: Diagnosis not present

## 2020-06-29 DIAGNOSIS — H43812 Vitreous degeneration, left eye: Secondary | ICD-10-CM | POA: Diagnosis not present

## 2020-07-05 ENCOUNTER — Other Ambulatory Visit: Payer: Self-pay | Admitting: Oncology

## 2020-07-20 ENCOUNTER — Other Ambulatory Visit: Payer: Self-pay

## 2020-07-20 ENCOUNTER — Inpatient Hospital Stay (HOSPITAL_BASED_OUTPATIENT_CLINIC_OR_DEPARTMENT_OTHER): Payer: Medicare HMO | Admitting: Oncology

## 2020-07-20 ENCOUNTER — Inpatient Hospital Stay: Payer: Medicare HMO | Attending: Oncology

## 2020-07-20 VITALS — BP 158/78 | HR 82 | Temp 97.5°F | Resp 15 | Ht 64.0 in | Wt 187.7 lb

## 2020-07-20 DIAGNOSIS — C49A5 Gastrointestinal stromal tumor of rectum: Secondary | ICD-10-CM | POA: Insufficient documentation

## 2020-07-20 DIAGNOSIS — Z79899 Other long term (current) drug therapy: Secondary | ICD-10-CM | POA: Diagnosis not present

## 2020-07-20 DIAGNOSIS — C49A4 Gastrointestinal stromal tumor of large intestine: Secondary | ICD-10-CM

## 2020-07-20 DIAGNOSIS — D649 Anemia, unspecified: Secondary | ICD-10-CM | POA: Diagnosis not present

## 2020-07-20 LAB — CMP (CANCER CENTER ONLY)
ALT: 9 U/L (ref 0–44)
AST: 12 U/L — ABNORMAL LOW (ref 15–41)
Albumin: 3.2 g/dL — ABNORMAL LOW (ref 3.5–5.0)
Alkaline Phosphatase: 123 U/L (ref 38–126)
Anion gap: 9 (ref 5–15)
BUN: 14 mg/dL (ref 8–23)
CO2: 25 mmol/L (ref 22–32)
Calcium: 8.4 mg/dL — ABNORMAL LOW (ref 8.9–10.3)
Chloride: 109 mmol/L (ref 98–111)
Creatinine: 1.24 mg/dL — ABNORMAL HIGH (ref 0.44–1.00)
GFR, Estimated: 46 mL/min — ABNORMAL LOW (ref >60)
Glucose, Bld: 101 mg/dL — ABNORMAL HIGH (ref 70–99)
Potassium: 3.5 mmol/L (ref 3.5–5.1)
Sodium: 143 mmol/L (ref 135–145)
Total Bilirubin: 0.7 mg/dL (ref 0.3–1.2)
Total Protein: 7.3 g/dL (ref 6.5–8.1)

## 2020-07-20 LAB — CBC WITH DIFFERENTIAL (CANCER CENTER ONLY)
Abs Immature Granulocytes: 0.01 10*3/uL (ref 0.00–0.07)
Basophils Absolute: 0 10*3/uL (ref 0.0–0.1)
Basophils Relative: 1 %
Eosinophils Absolute: 0.1 10*3/uL (ref 0.0–0.5)
Eosinophils Relative: 1 %
HCT: 34.5 % — ABNORMAL LOW (ref 36.0–46.0)
Hemoglobin: 11.3 g/dL — ABNORMAL LOW (ref 12.0–15.0)
Immature Granulocytes: 0 %
Lymphocytes Relative: 21 %
Lymphs Abs: 1.5 10*3/uL (ref 0.7–4.0)
MCH: 28.1 pg (ref 26.0–34.0)
MCHC: 32.8 g/dL (ref 30.0–36.0)
MCV: 85.8 fL (ref 80.0–100.0)
Monocytes Absolute: 0.3 10*3/uL (ref 0.1–1.0)
Monocytes Relative: 4 %
Neutro Abs: 5.1 10*3/uL (ref 1.7–7.7)
Neutrophils Relative %: 73 %
Platelet Count: 281 10*3/uL (ref 150–400)
RBC: 4.02 MIL/uL (ref 3.87–5.11)
RDW: 13.7 % (ref 11.5–15.5)
WBC Count: 7 10*3/uL (ref 4.0–10.5)
nRBC: 0 % (ref 0.0–0.2)

## 2020-07-20 NOTE — Progress Notes (Signed)
[place] OFFICE PROGRESS NOTE   Diagnosis: Gastrointestinal stromal tumor  INTERVAL HISTORY:   Brianna Walker returns as scheduled.  She continues Gleevec.  No diarrhea.  She has mild periorbital edema.  No other complaint.  Objective:  Vital signs in last 24 hours:  Blood pressure (!) 158/78, pulse 82, temperature (!) 97.5 F (36.4 C), temperature source Tympanic, resp. rate 15, height 5\' 4"  (1.626 m), weight 187 lb 11.2 oz (85.1 kg), SpO2 99 %.    HEENT: Trace periorbital edema, no thrush or ulcers Resp: Lungs clear bilaterally Cardio: Regular rate and rhythm GI: No hepatosplenomegaly, right abdomen hernia Vascular: No leg edema  Skin: No rash    Lab Results:  Lab Results  Component Value Date   WBC 7.0 07/20/2020   HGB 11.3 (L) 07/20/2020   HCT 34.5 (L) 07/20/2020   MCV 85.8 07/20/2020   PLT 281 07/20/2020   NEUTROABS 5.1 07/20/2020    CMP  Lab Results  Component Value Date   NA 143 07/20/2020   K 3.5 07/20/2020   CL 109 07/20/2020   CO2 25 07/20/2020   GLUCOSE 101 (H) 07/20/2020   BUN 14 07/20/2020   CREATININE 1.24 (H) 07/20/2020   CALCIUM 8.4 (L) 07/20/2020   PROT 7.3 07/20/2020   ALBUMIN 3.2 (L) 07/20/2020   AST 12 (L) 07/20/2020   ALT 9 07/20/2020   ALKPHOS 123 07/20/2020   BILITOT 0.7 07/20/2020   GFRNONAA 46 (L) 07/20/2020   GFRAA >60 12/05/2019     Medications: I have reviewed the patient's current medications.   Assessment/Plan: 1.Gastrointestinal stromal tumor of the rectum  Status post an endoscopic ultrasound on 08/06/2014 confirming a mass abutting the distal rectal wall with an FNA biopsy confirming a gastrointestinal stromal tumor  Initiation of Gleevec 09/04/2014  MRI pelvis 12/15/2014 with interval decreased size of the large anorectal mass with central necrosis.  Continuation of Gleevec  Gleevec placed on hold 06/02/2015 in anticipation of surgery  06/04/2015 status post low anterior resection and  diverting loop ileostomy, resection of tumor  Pathology: 9.5 cm tumor; GISTsubtype spindle; mitotic rate 1/50 high-power field; positive margin of resection  Adjuvant Gleevec 08/19/2015 (three-year in total course planned)  Ileostomy takedown 01/28/2016  Held 01/28/2016 through 02/14/2016, resumed 02/15/2016  Gleevec stop 04/11/2016-04/18/2016, and 04/26/2016-05/08/2016  Gleevec resumed at a dose of 200 mg daily on 05/09/2016  Gleevec dose increased to 300 mg daily beginning 06/20/2016  Gleevec dose decreased to 200 mg daily beginning 06/23/2016 (poor tolerance at 300 mg dose)  CTs 11/22/2017-enhancing soft tissue mass in the presacral space measuring 5.4 x 3.5 cm. Enlarged pelvic lymph nodes.  Gleevec continued at a dose of 200 mg daily  Restaging CTs 03/11/2018-interval decrease in size of the presacral soft tissue mass. Stable small pelvic lymph nodes.  Gleevec discontinued 03/13/2018  CT 11/14/2018-new 2.4 cm left pelvic nodule at the piriformis muscle, stable presacral lymph nodes, right abdominal wall hernia, ventral hernia  Gleevec 200 mg daily 12/04/2018  CTs11/30/2020-decrease in size of soft tissue nodule deep left pelvis. 5 nodules in the sigmoid mesocolon stable to slightly larger. Stable presacral soft tissue thickening. No findings for new metastatic disease.  CT abdomen/pelvis 09/01/2019-progressive presacral and left perirectal nodularity. Additional mesenteric nodules along the sigmoid mesocolon and left pelvis unchanged.  Gleevec dose escalated to 300 mg daily 09/02/2019  Gleevec dose escalated to 400 mg alternating with 300 mg beginning 09/16/2019  Gleevec 300 mg daily beginning 09/30/2019 (unable to tolerate 400 mg due  to diarrhea)  CT 12/15/2019-pelvic/peritoneal nodularity-unchanged  CT 06/02/2020-mildly worsened soft tissue density between the lower rectum and the sacrum. Mildly worsened superior anorectal adenopathy and perirectal adenopathy.  Office  review of CT consistent with stable disease  Gleevec 300 mg daily continued 2. Remote history of pulmonary embolism-maintained on apixaban 3. Multiple colon polyps noted on the colonoscopy 07/21/2014 with the pathology revealing tubular adenomas 4. Hospitalization 06/21/2015 through 07/06/2015 with nausea/vomiting, high ileostomy output; found to have delayed gastric emptying. 5. Water soluble contrast enema 08/13/2015 positive for a small to moderate volume of water soluble contrast leakage from the low anterior resection and anastomosis site in the pelvis 6. Ileostomy takedown and resection of cystic abdominal wall mass ( benign pathology) on 01/28/2016 7. Anemia with ferritin in the low normal range 05/23/2016 and 06/20/2016. Ferrous sulfate increased to twice daily 06/26/2016.Improved.    Disposition: [name] appears stable.  She is tolerating the [place] well.  She will continue Gleevec at the current dose.  She will return for an office and lab visit in 6 weeks.  Betsy Coder, MD  07/20/2020  1:55 PM

## 2020-07-23 ENCOUNTER — Other Ambulatory Visit: Payer: Self-pay | Admitting: Oncology

## 2020-07-23 ENCOUNTER — Other Ambulatory Visit: Payer: Self-pay | Admitting: *Deleted

## 2020-07-23 MED ORDER — DIPHENOXYLATE-ATROPINE 2.5-0.025 MG PO TABS: 1.0000 | ORAL_TABLET | Freq: Four times a day (QID) | ORAL | 0 refills | Status: DC | PRN

## 2020-07-23 MED FILL — DIPHENOXYLATE-ATROPINE 2.5-: 2.5-0.025 | 7 days supply | Qty: 60 | Fill #0

## 2020-07-27 ENCOUNTER — Other Ambulatory Visit (HOSPITAL_COMMUNITY): Payer: Self-pay

## 2020-08-05 ENCOUNTER — Other Ambulatory Visit (HOSPITAL_COMMUNITY): Payer: Self-pay

## 2020-08-05 ENCOUNTER — Other Ambulatory Visit: Payer: Self-pay | Admitting: Oncology

## 2020-08-05 MED ORDER — IMATINIB MESYLATE 100 MG PO TABS
ORAL_TABLET | ORAL | 0 refills | Status: DC
  Filled 2020-08-05: qty 90, fill #0
  Filled 2020-08-06: qty 90, 30d supply, fill #0

## 2020-08-06 ENCOUNTER — Other Ambulatory Visit (HOSPITAL_COMMUNITY): Payer: Self-pay

## 2020-08-11 ENCOUNTER — Other Ambulatory Visit (HOSPITAL_COMMUNITY): Payer: Self-pay

## 2020-08-27 ENCOUNTER — Other Ambulatory Visit: Payer: Self-pay | Admitting: Internal Medicine

## 2020-08-27 ENCOUNTER — Other Ambulatory Visit: Payer: Self-pay | Admitting: Nurse Practitioner

## 2020-08-31 ENCOUNTER — Inpatient Hospital Stay: Payer: Medicare HMO

## 2020-08-31 ENCOUNTER — Inpatient Hospital Stay: Payer: Medicare HMO | Attending: Oncology | Admitting: Nurse Practitioner

## 2020-09-06 ENCOUNTER — Other Ambulatory Visit (HOSPITAL_COMMUNITY): Payer: Self-pay

## 2020-09-08 ENCOUNTER — Other Ambulatory Visit (HOSPITAL_COMMUNITY): Payer: Self-pay

## 2020-09-09 ENCOUNTER — Encounter: Payer: Self-pay | Admitting: *Deleted

## 2020-09-09 DIAGNOSIS — B372 Candidiasis of skin and nail: Secondary | ICD-10-CM | POA: Insufficient documentation

## 2020-09-10 ENCOUNTER — Other Ambulatory Visit: Payer: Self-pay | Admitting: Oncology

## 2020-09-10 ENCOUNTER — Other Ambulatory Visit (HOSPITAL_COMMUNITY): Payer: Self-pay

## 2020-09-13 ENCOUNTER — Other Ambulatory Visit (HOSPITAL_COMMUNITY): Payer: Self-pay

## 2020-09-13 ENCOUNTER — Other Ambulatory Visit: Payer: Self-pay | Admitting: Oncology

## 2020-09-13 MED ORDER — IMATINIB MESYLATE 100 MG PO TABS
ORAL_TABLET | ORAL | 1 refills | Status: DC
  Filled 2020-09-13: qty 90, 30d supply, fill #0
  Filled 2020-10-08 – 2020-10-12 (×3): qty 90, 30d supply, fill #1

## 2020-09-15 ENCOUNTER — Other Ambulatory Visit (HOSPITAL_COMMUNITY): Payer: Self-pay

## 2020-09-21 DIAGNOSIS — H43812 Vitreous degeneration, left eye: Secondary | ICD-10-CM | POA: Diagnosis not present

## 2020-09-21 DIAGNOSIS — H3582 Retinal ischemia: Secondary | ICD-10-CM | POA: Diagnosis not present

## 2020-09-21 DIAGNOSIS — H3411 Central retinal artery occlusion, right eye: Secondary | ICD-10-CM | POA: Diagnosis not present

## 2020-09-22 ENCOUNTER — Telehealth: Payer: Self-pay | Admitting: Internal Medicine

## 2020-09-22 DIAGNOSIS — H5330 Unspecified disorder of binocular vision: Secondary | ICD-10-CM

## 2020-09-22 NOTE — Telephone Encounter (Signed)
Dr. Baird Cancer with [facility] called and said that the patient needs to have carotid doppler studies. Please advise   Phone: [phone]

## 2020-09-22 NOTE — Telephone Encounter (Signed)
Ok this is ordered 

## 2020-09-22 NOTE — Telephone Encounter (Signed)
See below

## 2020-09-29 ENCOUNTER — Ambulatory Visit (HOSPITAL_COMMUNITY)
Admission: RE | Admit: 2020-09-29 | Discharge: 2020-09-29 | Disposition: A | Discharge status: Home / Self Care | Payer: Medicare HMO | Source: Ambulatory Visit | Attending: Cardiovascular Disease | Admitting: Cardiovascular Disease

## 2020-09-29 ENCOUNTER — Other Ambulatory Visit: Payer: Self-pay

## 2020-09-29 DIAGNOSIS — H5330 Unspecified disorder of binocular vision: Secondary | ICD-10-CM | POA: Insufficient documentation

## 2020-09-29 DIAGNOSIS — H53451 Other localized visual field defect, right eye: Secondary | ICD-10-CM | POA: Diagnosis not present

## 2020-09-30 ENCOUNTER — Encounter: Payer: Self-pay | Admitting: Internal Medicine

## 2020-10-05 ENCOUNTER — Inpatient Hospital Stay: Payer: Medicare HMO

## 2020-10-08 ENCOUNTER — Other Ambulatory Visit (HOSPITAL_COMMUNITY): Payer: Self-pay

## 2020-10-11 ENCOUNTER — Telehealth: Payer: Self-pay | Admitting: Pharmacy Technician

## 2020-10-11 ENCOUNTER — Other Ambulatory Visit (HOSPITAL_COMMUNITY): Payer: Self-pay

## 2020-10-11 NOTE — Telephone Encounter (Signed)
Oral Oncology Patient Advocate Encounter   Received notification from [facility] that prior authorization for Imatinib (Gleevec generic) is required.   PA submitted to fax [phone] Status is pending   Oral Oncology Clinic will continue to follow.  [place] Patient [place] Phone [phone] Fax [phone] 10/11/2020 4:08 PM

## 2020-10-12 ENCOUNTER — Inpatient Hospital Stay (HOSPITAL_BASED_OUTPATIENT_CLINIC_OR_DEPARTMENT_OTHER): Payer: Medicare HMO | Admitting: Nurse Practitioner

## 2020-10-12 ENCOUNTER — Inpatient Hospital Stay: Payer: Medicare HMO | Attending: Oncology

## 2020-10-12 ENCOUNTER — Other Ambulatory Visit: Payer: Self-pay

## 2020-10-12 ENCOUNTER — Other Ambulatory Visit (HOSPITAL_COMMUNITY): Payer: Self-pay

## 2020-10-12 ENCOUNTER — Encounter: Payer: Self-pay | Admitting: Nurse Practitioner

## 2020-10-12 VITALS — BP 140/80 | HR 94 | Temp 98.1°F | Resp 20 | Ht 64.0 in | Wt 182.4 lb

## 2020-10-12 DIAGNOSIS — C49A4 Gastrointestinal stromal tumor of large intestine: Secondary | ICD-10-CM

## 2020-10-12 DIAGNOSIS — D649 Anemia, unspecified: Secondary | ICD-10-CM | POA: Insufficient documentation

## 2020-10-12 DIAGNOSIS — R197 Diarrhea, unspecified: Secondary | ICD-10-CM | POA: Diagnosis not present

## 2020-10-12 DIAGNOSIS — C49A5 Gastrointestinal stromal tumor of rectum: Secondary | ICD-10-CM | POA: Diagnosis not present

## 2020-10-12 DIAGNOSIS — Z79899 Other long term (current) drug therapy: Secondary | ICD-10-CM | POA: Diagnosis not present

## 2020-10-12 DIAGNOSIS — R609 Edema, unspecified: Secondary | ICD-10-CM | POA: Insufficient documentation

## 2020-10-12 LAB — CMP (CANCER CENTER ONLY)
ALT: 6 U/L (ref 0–44)
AST: 10 U/L — ABNORMAL LOW (ref 15–41)
Albumin: 3.6 g/dL (ref 3.5–5.0)
Alkaline Phosphatase: 111 U/L (ref 38–126)
Anion gap: 8 (ref 5–15)
BUN: 11 mg/dL (ref 8–23)
CO2: 25 mmol/L (ref 22–32)
Calcium: 8.8 mg/dL — ABNORMAL LOW (ref 8.9–10.3)
Chloride: 110 mmol/L (ref 98–111)
Creatinine: 1.13 mg/dL — ABNORMAL HIGH (ref 0.44–1.00)
GFR, Estimated: 52 mL/min — ABNORMAL LOW (ref >60)
Glucose, Bld: 94 mg/dL (ref 70–99)
Potassium: 3.9 mmol/L (ref 3.5–5.1)
Sodium: 143 mmol/L (ref 135–145)
Total Bilirubin: 0.9 mg/dL (ref 0.3–1.2)
Total Protein: 7.4 g/dL (ref 6.5–8.1)

## 2020-10-12 LAB — CBC WITH DIFFERENTIAL (CANCER CENTER ONLY)
Abs Immature Granulocytes: 0.02 10*3/uL (ref 0.00–0.07)
Basophils Absolute: 0 10*3/uL (ref 0.0–0.1)
Basophils Relative: 0 %
Eosinophils Absolute: 0.1 10*3/uL (ref 0.0–0.5)
Eosinophils Relative: 1 %
HCT: 37.9 % (ref 36.0–46.0)
Hemoglobin: 12.4 g/dL (ref 12.0–15.0)
Immature Granulocytes: 0 %
Lymphocytes Relative: 23 %
Lymphs Abs: 1.7 10*3/uL (ref 0.7–4.0)
MCH: 28.4 pg (ref 26.0–34.0)
MCHC: 32.7 g/dL (ref 30.0–36.0)
MCV: 86.7 fL (ref 80.0–100.0)
Monocytes Absolute: 0.4 10*3/uL (ref 0.1–1.0)
Monocytes Relative: 5 %
Neutro Abs: 5 10*3/uL (ref 1.7–7.7)
Neutrophils Relative %: 71 %
Platelet Count: 290 10*3/uL (ref 150–400)
RBC: 4.37 MIL/uL (ref 3.87–5.11)
RDW: 14.2 % (ref 11.5–15.5)
WBC Count: 7.2 10*3/uL (ref 4.0–10.5)
nRBC: 0 % (ref 0.0–0.2)

## 2020-10-12 LAB — MAGNESIUM: Magnesium: 1.5 mg/dL — ABNORMAL LOW (ref 1.7–2.4)

## 2020-10-12 MED ORDER — MAGNESIUM OXIDE -MG SUPPLEMENT 400 (240 MG) MG PO TABS: 400.0000 mg | ORAL_TABLET | Freq: Every day | ORAL | 1 refills | Status: DC

## 2020-10-12 NOTE — Progress Notes (Signed)
Bri[facility] Walker OFFICE PROGRESS NOTE   Diagnosis: Gastrointestinal stromal tumor  INTERVAL HISTORY:   Brianna Walker returns as scheduled.  She continues Gleevec.  She feels well.  She has an occasional loose stool related to her diet.  No nausea or vomiting.  No rash.  Stable mild periorbital edema.  No leg swelling.  She has a good appetite.  She denies abdominal pain.  Objective:  Vital signs in last 24 hours:  Blood pressure 140/80, pulse 94, temperature 98.1 F (36.7 C), temperature source Oral, resp. rate 20, height 5\' 4"  (1.626 m), weight 182 lb 6.4 oz (82.7 kg), SpO2 100 %.    HEENT: Trace periorbital edema.  No thrush or ulcers. Resp: Lungs clear bilaterally. Cardio: Regular rate and rhythm. GI: Abdomen soft and nontender.  No hepatosplenomegaly.  Right abdomen hernia. Vascular: No leg edema. Skin: No rash.   Lab Results:  Lab Results  Component Value Date   WBC 7.2 10/12/2020   HGB 12.4 10/12/2020   HCT 37.9 10/12/2020   MCV 86.7 10/12/2020   PLT 290 10/12/2020   NEUTROABS 5.0 10/12/2020    Imaging:  No results found.  Medications: I have reviewed the patient's current medications.  Assessment/Plan: 1. Gastrointestinal stromal tumor of the rectum Status post an endoscopic ultrasound on 08/06/2014 confirming a mass abutting the distal rectal wall with an FNA biopsy confirming a gastrointestinal stromal tumor Initiation of Gleevec 09/04/2014 MRI pelvis 12/15/2014 with interval decreased size of the large anorectal mass with central necrosis. Continuation of Gleevec Gleevec placed on hold 06/02/2015 in anticipation of surgery 06/04/2015 status post low anterior resection and diverting loop ileostomy, resection of tumor Pathology: 9.5 cm tumor; GISTsubtype spindle; mitotic rate 1/50 high-power field; positive margin of resection Adjuvant Gleevec 08/19/2015 (three-year in total course planned) Ileostomy takedown 01/28/2016 Held 01/28/2016 through  02/14/2016, resumed 02/15/2016 Gleevec stop 04/11/2016-04/18/2016, and 04/26/2016-05/08/2016 Gleevec resumed at a dose of 200 mg daily on 05/09/2016 Gleevec dose increased to 300 mg daily beginning 06/20/2016 Gleevec dose decreased to 200 mg daily beginning 06/23/2016 (poor tolerance at 300 mg dose) CTs 11/22/2017- enhancing soft tissue mass in the presacral space measuring 5.4 x 3.5 cm.  Enlarged pelvic lymph nodes. Gleevec continued at a dose of 200 mg daily Restaging CTs 03/11/2018- interval decrease in size of the presacral soft tissue mass.  Stable small pelvic lymph nodes. Gleevec discontinued 03/13/2018 CT 11/14/2018- new 2.4 cm left pelvic nodule at the piriformis muscle, stable presacral lymph nodes, right abdominal wall hernia, ventral hernia Gleevec 200 mg daily 12/04/2018 CTs 03/31/2019 -decrease in size of soft tissue nodule deep left pelvis.  5 nodules in the sigmoid mesocolon stable to slightly larger.  Stable presacral soft tissue thickening.  No findings for new metastatic disease. CT abdomen/pelvis 09/01/2019-progressive presacral and left perirectal nodularity.  Additional mesenteric nodules along the sigmoid mesocolon and left pelvis unchanged. Gleevec dose escalated to 300 mg daily 09/02/2019 Gleevec dose escalated to 400 mg alternating with 300 mg beginning 09/16/2019 Gleevec 300 mg daily beginning 09/30/2019 (unable to tolerate 400 mg due to diarrhea) CT 12/15/2019-pelvic/peritoneal nodularity-unchanged CT 06/02/2020-mildly worsened soft tissue density between the lower rectum and the sacrum. Mildly worsened superior anorectal adenopathy and perirectal adenopathy.  Office review of CT consistent with stable disease Gleevec 300 mg daily continued Remote history of pulmonary embolism-maintained on apixaban Multiple colon polyps noted on the colonoscopy 07/21/2014 with the pathology revealing tubular adenomas Hospitalization 06/21/2015 through 07/06/2015 with nausea/vomiting, high  ileostomy output; found to have delayed  gastric emptying. Water soluble contrast enema 08/13/2015 positive for a small to moderate volume of water soluble contrast leakage from the low anterior resection and anastomosis site in the pelvis Ileostomy takedown and resection of cystic abdominal wall mass ( benign pathology) on 01/28/2016 Anemia with ferritin in the low normal range 05/23/2016 and 06/20/2016. Ferrous sulfate increased to twice daily 06/26/2016. Improved.      Disposition: Brianna Walker appears stable.  There is no clinical evidence of disease progression.  She will continue Gleevec.  We reviewed the CBC from today.  Counts are adequate to continue with Gleevec as above.  She will return for lab and follow-up in 6 weeks.    Ned Card ANP/GNP-BC   10/12/2020  11:09 AM

## 2020-10-13 NOTE — Telephone Encounter (Signed)
Oral Oncology Patient Advocate Encounter  Prior Authorization for [place] has been approved.    Effective dates: 10/11/20 through 04/30/21  Oral Oncology Clinic will continue to follow.   Brianna Walker Patient [place] Phone [phone] Fax [phone] 10/13/2020 1:59 PM

## 2020-10-29 ENCOUNTER — Other Ambulatory Visit: Payer: Self-pay | Admitting: Internal Medicine

## 2020-10-29 DIAGNOSIS — Z1231 Encounter for screening mammogram for malignant neoplasm of breast: Secondary | ICD-10-CM

## 2020-11-03 ENCOUNTER — Other Ambulatory Visit: Payer: Self-pay | Admitting: Oncology

## 2020-11-03 ENCOUNTER — Other Ambulatory Visit (HOSPITAL_COMMUNITY): Payer: Self-pay

## 2020-11-04 MED ORDER — IMATINIB MESYLATE 100 MG PO TABS
ORAL_TABLET | ORAL | 1 refills | Status: DC
  Filled 2020-11-04: qty 90, 30d supply, fill #0
  Filled 2020-12-01: qty 90, 30d supply, fill #1

## 2020-11-05 ENCOUNTER — Other Ambulatory Visit (HOSPITAL_COMMUNITY): Payer: Self-pay

## 2020-11-05 ENCOUNTER — Other Ambulatory Visit: Payer: Self-pay | Admitting: Oncology

## 2020-11-08 ENCOUNTER — Other Ambulatory Visit (HOSPITAL_COMMUNITY): Payer: Self-pay

## 2020-11-08 MED ORDER — DIPHENOXYLATE-ATROPINE 2.5-0.025 MG PO TABS
ORAL_TABLET | ORAL | 0 refills | Status: DC
Start: 2020-11-08 — End: 2021-01-18
  Filled 2020-11-08: qty 60, 8d supply, fill #0

## 2020-11-25 ENCOUNTER — Inpatient Hospital Stay: Payer: Medicare HMO | Admitting: Oncology

## 2020-11-25 ENCOUNTER — Inpatient Hospital Stay: Payer: Medicare HMO

## 2020-12-01 ENCOUNTER — Other Ambulatory Visit (HOSPITAL_COMMUNITY): Payer: Self-pay

## 2020-12-01 ENCOUNTER — Other Ambulatory Visit: Payer: Self-pay | Admitting: Nurse Practitioner

## 2020-12-01 DIAGNOSIS — C49A4 Gastrointestinal stromal tumor of large intestine: Secondary | ICD-10-CM

## 2020-12-06 ENCOUNTER — Other Ambulatory Visit (HOSPITAL_COMMUNITY): Payer: Self-pay

## 2020-12-22 ENCOUNTER — Ambulatory Visit: Payer: Medicare HMO

## 2020-12-29 ENCOUNTER — Other Ambulatory Visit: Payer: Self-pay | Admitting: Oncology

## 2020-12-29 ENCOUNTER — Other Ambulatory Visit (HOSPITAL_COMMUNITY): Payer: Self-pay

## 2020-12-29 MED ORDER — IMATINIB MESYLATE 100 MG PO TABS
ORAL_TABLET | ORAL | 1 refills | Status: DC
  Filled 2020-12-29 (×2): qty 90, 30d supply, fill #0

## 2020-12-30 ENCOUNTER — Other Ambulatory Visit (HOSPITAL_COMMUNITY): Payer: Self-pay

## 2021-01-01 ENCOUNTER — Ambulatory Visit
Admission: RE | Admit: 2021-01-01 | Discharge: 2021-01-01 | Disposition: A | Discharge status: Home / Self Care | Payer: Medicare HMO | Source: Ambulatory Visit | Attending: Internal Medicine | Admitting: Internal Medicine

## 2021-01-01 ENCOUNTER — Other Ambulatory Visit: Payer: Self-pay

## 2021-01-01 DIAGNOSIS — Z1231 Encounter for screening mammogram for malignant neoplasm of breast: Secondary | ICD-10-CM | POA: Diagnosis not present

## 2021-01-04 ENCOUNTER — Other Ambulatory Visit: Payer: Self-pay

## 2021-01-04 ENCOUNTER — Inpatient Hospital Stay: Payer: Medicare HMO | Attending: Oncology

## 2021-01-04 ENCOUNTER — Inpatient Hospital Stay (HOSPITAL_BASED_OUTPATIENT_CLINIC_OR_DEPARTMENT_OTHER): Payer: Medicare HMO | Admitting: Oncology

## 2021-01-04 VITALS — BP 130/80 | HR 84 | Temp 97.7°F | Resp 18 | Ht 64.0 in | Wt 171.2 lb

## 2021-01-04 DIAGNOSIS — C49A5 Gastrointestinal stromal tumor of rectum: Secondary | ICD-10-CM | POA: Insufficient documentation

## 2021-01-04 DIAGNOSIS — C49A4 Gastrointestinal stromal tumor of large intestine: Secondary | ICD-10-CM

## 2021-01-04 DIAGNOSIS — Z79899 Other long term (current) drug therapy: Secondary | ICD-10-CM | POA: Diagnosis not present

## 2021-01-04 DIAGNOSIS — D649 Anemia, unspecified: Secondary | ICD-10-CM | POA: Insufficient documentation

## 2021-01-04 DIAGNOSIS — R197 Diarrhea, unspecified: Secondary | ICD-10-CM | POA: Diagnosis not present

## 2021-01-04 LAB — CBC WITH DIFFERENTIAL (CANCER CENTER ONLY)
Abs Immature Granulocytes: 0.01 10*3/uL (ref 0.00–0.07)
Basophils Absolute: 0 10*3/uL (ref 0.0–0.1)
Basophils Relative: 0 %
Eosinophils Absolute: 0 10*3/uL (ref 0.0–0.5)
Eosinophils Relative: 1 %
HCT: 33.7 % — ABNORMAL LOW (ref 36.0–46.0)
Hemoglobin: 11 g/dL — ABNORMAL LOW (ref 12.0–15.0)
Immature Granulocytes: 0 %
Lymphocytes Relative: 25 %
Lymphs Abs: 1.3 10*3/uL (ref 0.7–4.0)
MCH: 28.5 pg (ref 26.0–34.0)
MCHC: 32.6 g/dL (ref 30.0–36.0)
MCV: 87.3 fL (ref 80.0–100.0)
Monocytes Absolute: 0.4 10*3/uL (ref 0.1–1.0)
Monocytes Relative: 8 %
Neutro Abs: 3.3 10*3/uL (ref 1.7–7.7)
Neutrophils Relative %: 66 %
Platelet Count: 233 10*3/uL (ref 150–400)
RBC: 3.86 MIL/uL — ABNORMAL LOW (ref 3.87–5.11)
RDW: 14.8 % (ref 11.5–15.5)
WBC Count: 5 10*3/uL (ref 4.0–10.5)
nRBC: 0 % (ref 0.0–0.2)

## 2021-01-04 LAB — CMP (CANCER CENTER ONLY)
ALT: 12 U/L (ref 0–44)
AST: 19 U/L (ref 15–41)
Albumin: 3.3 g/dL — ABNORMAL LOW (ref 3.5–5.0)
Alkaline Phosphatase: 89 U/L (ref 38–126)
Anion gap: 7 (ref 5–15)
BUN: 14 mg/dL (ref 8–23)
CO2: 27 mmol/L (ref 22–32)
Calcium: 8.5 mg/dL — ABNORMAL LOW (ref 8.9–10.3)
Chloride: 110 mmol/L (ref 98–111)
Creatinine: 1.21 mg/dL — ABNORMAL HIGH (ref 0.44–1.00)
GFR, Estimated: 47 mL/min — ABNORMAL LOW (ref >60)
Glucose, Bld: 87 mg/dL (ref 70–99)
Potassium: 3.7 mmol/L (ref 3.5–5.1)
Sodium: 144 mmol/L (ref 135–145)
Total Bilirubin: 1 mg/dL (ref 0.3–1.2)
Total Protein: 6.8 g/dL (ref 6.5–8.1)

## 2021-01-04 LAB — MAGNESIUM: Magnesium: 1.5 mg/dL — ABNORMAL LOW (ref 1.7–2.4)

## 2021-01-04 NOTE — Progress Notes (Signed)
[facility] OFFICE PROGRESS NOTE   Diagnosis: Gastrointestinal stromal Walker  INTERVAL HISTORY:   [facility] Walker returns as scheduled.  She continues Gleevec.  She reports diarrhea for the past 2 weeks.  She has greater than 5 bowel movements per day.  The stool is loose and appears "oily ".  The bowel movements generally happen after meals.  No fever, nausea, or bleeding.  No other complaint.  Lomotil has helped for diarrhea in the past, but is not working now.  She does not like the taste of Imodium.  Objective:  Vital signs in last 24 hours:  Blood pressure 130/80, pulse 84, temperature 97.7 F (36.5 C), temperature source Oral, resp. rate 18, height '5\' 4"'$  (1.626 m), weight 171 lb 3.2 oz (77.7 kg), SpO2 100 %.    HEENT: No thrush or ulcers, the mucous membranes are moist Resp: Lungs clear bilaterally Cardio: Regular rate and rhythm GI: No hepatosplenomegaly, nontender, right abdominal hernia Vascular: No leg edema  Lab Results:  Lab Results  Component Value Date   WBC 5.0 01/04/2021   HGB 11.0 (L) 01/04/2021   HCT 33.7 (L) 01/04/2021   MCV 87.3 01/04/2021   PLT 233 01/04/2021   NEUTROABS 3.3 01/04/2021    CMP  Lab Results  Component Value Date   NA 143 10/12/2020   K 3.9 10/12/2020   CL 110 10/12/2020   CO2 25 10/12/2020   GLUCOSE 94 10/12/2020   BUN 11 10/12/2020   CREATININE 1.13 (H) 10/12/2020   CALCIUM 8.8 (L) 10/12/2020   PROT 7.4 10/12/2020   ALBUMIN 3.6 10/12/2020   AST 10 (L) 10/12/2020   ALT 6 10/12/2020   ALKPHOS 111 10/12/2020   BILITOT 0.9 10/12/2020   GFRNONAA 52 (L) 10/12/2020   GFRAA >60 12/05/2019    No results found for: CEA1, CEA, CAN199, CA125  Lab Results  Component Value Date   INR 0.94 01/26/2016   LABPROT 12.6 01/26/2016    Imaging:  MM 3D SCREEN BREAST BILATERAL  Result Date: 01/03/2021 CLINICAL DATA:  Screening. EXAM: DIGITAL SCREENING BILATERAL MAMMOGRAM WITH TOMOSYNTHESIS AND CAD TECHNIQUE: Bilateral  screening digital craniocaudal and mediolateral oblique mammograms were obtained. Bilateral screening digital breast tomosynthesis was performed. The images were evaluated with computer-aided detection. COMPARISON:  Previous exam(s). ACR Breast Density Category b: There are scattered areas of fibroglandular density. FINDINGS: There are no findings suspicious for malignancy. IMPRESSION: No mammographic evidence of malignancy. A result letter of this screening mammogram will be mailed directly to the patient. RECOMMENDATION: Screening mammogram in one year. (Code:SM-B-01Y) BI-RADS CATEGORY  1: Negative. Electronically Signed   By: Dorise Bullion III M.D.   On: 01/03/2021 17:15    Medications: I have reviewed the patient's current medications.   Assessment/Plan: Gastrointestinal stromal Walker of the rectum Status post an endoscopic ultrasound on 08/06/2014 confirming a mass abutting the distal rectal wall with an FNA biopsy confirming a gastrointestinal stromal Walker Initiation of Gleevec 09/04/2014 MRI pelvis 12/15/2014 with interval decreased size of the large anorectal mass with central necrosis. Continuation of Gleevec Gleevec placed on hold 06/02/2015 in anticipation of surgery 06/04/2015 status post low anterior resection and diverting loop ileostomy, resection of Walker Pathology: 9.5 cm Walker; GISTsubtype spindle; mitotic rate 1/50 high-power field; positive margin of resection Adjuvant Gleevec 08/19/2015 (three-year in total course planned) Ileostomy takedown 01/28/2016 Held 01/28/2016 through 02/14/2016, resumed 02/15/2016 Gleevec stop 04/11/2016-04/18/2016, and 04/26/2016-05/08/2016 Gleevec resumed at a dose of 200 mg daily on 05/09/2016 Gleevec dose increased to 300  mg daily beginning 06/20/2016 Gleevec dose decreased to 200 mg daily beginning 06/23/2016 (poor tolerance at 300 mg dose) CTs 11/22/2017- enhancing soft tissue mass in the presacral space measuring 5.4 x 3.5 cm.  Enlarged  pelvic lymph nodes. Gleevec continued at a dose of 200 mg daily Restaging CTs 03/11/2018- interval decrease in size of the presacral soft tissue mass.  Stable small pelvic lymph nodes. Gleevec discontinued 03/13/2018 CT 11/14/2018- new 2.4 cm left pelvic nodule at the piriformis muscle, stable presacral lymph nodes, right abdominal wall hernia, ventral hernia Gleevec 200 mg daily 12/04/2018 CTs 03/31/2019 -decrease in size of soft tissue nodule deep left pelvis.  5 nodules in the sigmoid mesocolon stable to slightly larger.  Stable presacral soft tissue thickening.  No findings for new metastatic disease. CT abdomen/pelvis 09/01/2019-progressive presacral and left perirectal nodularity.  Additional mesenteric nodules along the sigmoid mesocolon and left pelvis unchanged. Gleevec dose escalated to 300 mg daily 09/02/2019 Gleevec dose escalated to 400 mg alternating with 300 mg beginning 09/16/2019 Gleevec 300 mg daily beginning 09/30/2019 (unable to tolerate 400 mg due to diarrhea) CT 12/15/2019-pelvic/peritoneal nodularity-unchanged CT 06/02/2020-mildly worsened soft tissue density between the lower rectum and the sacrum. Mildly worsened superior anorectal adenopathy and perirectal adenopathy.  Office review of CT consistent with stable disease Gleevec 300 mg daily continued Remote history of pulmonary embolism-maintained on apixaban Multiple colon polyps noted on the colonoscopy 07/21/2014 with the pathology revealing tubular adenomas Hospitalization 06/21/2015 through 07/06/2015 with nausea/vomiting, high ileostomy output; found to have delayed gastric emptying. Water soluble contrast enema 08/13/2015 positive for a small to moderate volume of water soluble contrast leakage from the low anterior resection and anastomosis site in the pelvis Ileostomy takedown and resection of cystic abdominal wall mass ( benign pathology) on 01/28/2016 Anemia with ferritin in the low normal range 05/23/2016 and 06/20/2016.  Ferrous sulfate increased to twice daily 06/26/2016. Improved.       Disposition: [facility] Walker has a history of recurrent gastrointestinal stromal Walker.  She has now maintained on Gleevec.  She has developed diarrhea over the past few weeks.  She has a history of diarrhea with higher dose Gleevec in the past.  It is possible the current diarrhea is related to [place] or another etiology.  She will hold Gleevec and submit a stool sample for C. difficile testing.  It is possible she has developed pancreas insufficiency.  She will continue potassium and increase the magnesium supplement.  [facility] Walker will return for an office and lab visit on 01/10/2021.  Betsy Coder, MD  01/04/2021  11:13 AM

## 2021-01-05 ENCOUNTER — Telehealth: Payer: Self-pay

## 2021-01-05 NOTE — Telephone Encounter (Signed)
-----   Message from Ladell Pier, MD sent at 01/04/2021  4:39 PM EDT ----- Please call to check on her diarrhea 01/06/2021

## 2021-01-05 NOTE — Telephone Encounter (Signed)
TC to Pt per Dr Benay Spice. Following up after yesterday's visit. Pt reported that she was having diarrhea occasionally. Pt was told by Dr. Benay Spice to hold [place] and to give a stool sample for Cdiff. Pt stated today that she is feeling good today and is not having diarrhea to get the sample. Pt stated she would try to get a sample before Friday.

## 2021-01-07 ENCOUNTER — Inpatient Hospital Stay: Payer: Medicare HMO

## 2021-01-07 ENCOUNTER — Other Ambulatory Visit: Payer: Self-pay

## 2021-01-07 ENCOUNTER — Inpatient Hospital Stay (HOSPITAL_BASED_OUTPATIENT_CLINIC_OR_DEPARTMENT_OTHER): Payer: Medicare HMO | Admitting: Nurse Practitioner

## 2021-01-07 ENCOUNTER — Encounter: Payer: Self-pay | Admitting: Nurse Practitioner

## 2021-01-07 VITALS — BP 134/80 | HR 95 | Temp 98.1°F | Resp 18 | Ht 64.0 in | Wt 169.8 lb

## 2021-01-07 DIAGNOSIS — Z79899 Other long term (current) drug therapy: Secondary | ICD-10-CM | POA: Diagnosis not present

## 2021-01-07 DIAGNOSIS — C49A4 Gastrointestinal stromal tumor of large intestine: Secondary | ICD-10-CM | POA: Diagnosis not present

## 2021-01-07 DIAGNOSIS — R197 Diarrhea, unspecified: Secondary | ICD-10-CM | POA: Diagnosis not present

## 2021-01-07 DIAGNOSIS — C49A5 Gastrointestinal stromal tumor of rectum: Secondary | ICD-10-CM | POA: Diagnosis not present

## 2021-01-07 DIAGNOSIS — D649 Anemia, unspecified: Secondary | ICD-10-CM | POA: Diagnosis not present

## 2021-01-07 LAB — C DIFFICILE QUICK SCREEN W PCR REFLEX
C Diff antigen: NEGATIVE
C Diff interpretation: NOT DETECTED
C Diff toxin: NEGATIVE

## 2021-01-07 NOTE — Progress Notes (Signed)
[facility] OFFICE PROGRESS NOTE   Diagnosis: Gastrointestinal stromal tumor  INTERVAL HISTORY:   [facility] Walker returns as scheduled.  Gleevec was placed on hold 01/04/2021 due to diarrhea.  She has had a single loose stool since discontinuing Gleevec.  The loose stool occurred after eating a piece of pecan pie.  She is feeling well at present.  No nausea or vomiting.  Objective:  Vital signs in last 24 hours:  Blood pressure 134/80, pulse 95, temperature 98.1 F (36.7 C), temperature source Oral, resp. rate 18, height '5\' 4"'$  (1.626 m), weight 169 lb 12.8 oz (77 kg), SpO2 100 %.    HEENT: No thrush or ulcers. Resp: Lungs clear bilaterally. Cardio: Regular rate and rhythm. GI: Abdomen soft and nontender.  No hepatomegaly.  No mass.  Right abdominal hernia. Vascular: No leg edema.   Lab Results:  Lab Results  Component Value Date   WBC 5.0 01/04/2021   HGB 11.0 (L) 01/04/2021   HCT 33.7 (L) 01/04/2021   MCV 87.3 01/04/2021   PLT 233 01/04/2021   NEUTROABS 3.3 01/04/2021    Imaging:  No results found.  Medications: I have reviewed the patient's current medications.  Assessment/Plan: Gastrointestinal stromal tumor of the rectum Status post an endoscopic ultrasound on 08/06/2014 confirming a mass abutting the distal rectal wall with an FNA biopsy confirming a gastrointestinal stromal tumor Initiation of Gleevec 09/04/2014 MRI pelvis 12/15/2014 with interval decreased size of the large anorectal mass with central necrosis. Continuation of Gleevec Gleevec placed on hold 06/02/2015 in anticipation of surgery 06/04/2015 status post low anterior resection and diverting loop ileostomy, resection of tumor Pathology: 9.5 cm tumor; GISTsubtype spindle; mitotic rate 1/50 high-power field; positive margin of resection Adjuvant Gleevec 08/19/2015 (three-year in total course planned) Ileostomy takedown 01/28/2016 Held 01/28/2016 through 02/14/2016, resumed  02/15/2016 Gleevec stop 04/11/2016-04/18/2016, and 04/26/2016-05/08/2016 Gleevec resumed at a dose of 200 mg daily on 05/09/2016 Gleevec dose increased to 300 mg daily beginning 06/20/2016 Gleevec dose decreased to 200 mg daily beginning 06/23/2016 (poor tolerance at 300 mg dose) CTs 11/22/2017- enhancing soft tissue mass in the presacral space measuring 5.4 x 3.5 cm.  Enlarged pelvic lymph nodes. Gleevec continued at a dose of 200 mg daily Restaging CTs 03/11/2018- interval decrease in size of the presacral soft tissue mass.  Stable small pelvic lymph nodes. Gleevec discontinued 03/13/2018 CT 11/14/2018- new 2.4 cm left pelvic nodule at the piriformis muscle, stable presacral lymph nodes, right abdominal wall hernia, ventral hernia Gleevec 200 mg daily 12/04/2018 CTs 03/31/2019 -decrease in size of soft tissue nodule deep left pelvis.  5 nodules in the sigmoid mesocolon stable to slightly larger.  Stable presacral soft tissue thickening.  No findings for new metastatic disease. CT abdomen/pelvis 09/01/2019-progressive presacral and left perirectal nodularity.  Additional mesenteric nodules along the sigmoid mesocolon and left pelvis unchanged. Gleevec dose escalated to 300 mg daily 09/02/2019 Gleevec dose escalated to 400 mg alternating with 300 mg beginning 09/16/2019 Gleevec 300 mg daily beginning 09/30/2019 (unable to tolerate 400 mg due to diarrhea) CT 12/15/2019-pelvic/peritoneal nodularity-unchanged CT 06/02/2020-mildly worsened soft tissue density between the lower rectum and the sacrum. Mildly worsened superior anorectal adenopathy and perirectal adenopathy.  Office review of CT consistent with stable disease Gleevec 300 mg daily continued Remote history of pulmonary embolism-maintained on apixaban Multiple colon polyps noted on the colonoscopy 07/21/2014 with the pathology revealing tubular adenomas Hospitalization 06/21/2015 through 07/06/2015 with nausea/vomiting, high ileostomy output; found to  have delayed gastric emptying. Water soluble contrast  enema 08/13/2015 positive for a small to moderate volume of water soluble contrast leakage from the low anterior resection and anastomosis site in the pelvis Ileostomy takedown and resection of cystic abdominal wall mass ( benign pathology) on 01/28/2016 Anemia with ferritin in the low normal range 05/23/2016 and 06/20/2016. Ferrous sulfate increased to twice daily 06/26/2016. Improved.      Disposition: [facility] Walker appears stable.  The diarrhea has largely resolved since placing [place] on hold.  She will resume Gleevec at a dose of 200 mg daily.  She will place Gleevec on hold and contact the office if the diarrhea recurs.  She submitted a stool sample today for C. difficile testing.  We will follow-up on the result if the test can be completed.  We will follow-up on the basic metabolic panel and magnesium from today.  She will return for lab and follow-up in approximately 3 weeks.  We are available to see her sooner if needed.      Ned Card ANP/GNP-BC   01/07/2021  1:48 PM

## 2021-01-18 ENCOUNTER — Other Ambulatory Visit (HOSPITAL_COMMUNITY): Payer: Self-pay

## 2021-01-18 ENCOUNTER — Other Ambulatory Visit: Payer: Self-pay | Admitting: Oncology

## 2021-01-18 MED ORDER — DIPHENOXYLATE-ATROPINE 2.5-0.025 MG PO TABS
ORAL_TABLET | ORAL | 0 refills | Status: DC
Start: 2021-01-18 — End: 2021-05-19
  Filled 2021-01-18: qty 60, 8d supply, fill #0

## 2021-01-25 ENCOUNTER — Other Ambulatory Visit (HOSPITAL_COMMUNITY): Payer: Self-pay

## 2021-01-25 ENCOUNTER — Telehealth: Payer: Self-pay | Admitting: *Deleted

## 2021-01-25 DIAGNOSIS — H5213 Myopia, bilateral: Secondary | ICD-10-CM | POA: Diagnosis not present

## 2021-01-25 DIAGNOSIS — H524 Presbyopia: Secondary | ICD-10-CM | POA: Diagnosis not present

## 2021-01-25 DIAGNOSIS — Z961 Presence of intraocular lens: Secondary | ICD-10-CM | POA: Diagnosis not present

## 2021-01-25 DIAGNOSIS — H47291 Other optic atrophy, right eye: Secondary | ICD-10-CM | POA: Diagnosis not present

## 2021-01-25 DIAGNOSIS — H3411 Central retinal artery occlusion, right eye: Secondary | ICD-10-CM | POA: Diagnosis not present

## 2021-01-25 DIAGNOSIS — H52203 Unspecified astigmatism, bilateral: Secondary | ICD-10-CM | POA: Diagnosis not present

## 2021-01-25 NOTE — Telephone Encounter (Signed)
Reports her liquid stools have returned--started day after she resumed the Gleevec at reduced dose of 200 mg. Reports liquid stool up to 10/day of low volume. No abdominal pain or fever. Takes 6-8 Lomotil/day. Hemorrhoids are painful and burning--using suppository and Tylenol prn. Denies dehydration--able to drink plenty of fluids. Scheduled for return Lab/OV on 9/29.

## 2021-01-25 NOTE — Telephone Encounter (Signed)
Per Dr. Benay Spice: Stop Gleevec and f/u as scheduled on 01/27/21. Left this information on her mobile voice mail with request to return call to confirm.

## 2021-01-26 NOTE — Telephone Encounter (Signed)
Patient called back to confirm she did receive message from nurse to hold [place] and f/u as scheduled.

## 2021-01-27 ENCOUNTER — Inpatient Hospital Stay: Payer: Medicare HMO

## 2021-01-27 ENCOUNTER — Other Ambulatory Visit: Payer: Self-pay

## 2021-01-27 ENCOUNTER — Inpatient Hospital Stay (HOSPITAL_BASED_OUTPATIENT_CLINIC_OR_DEPARTMENT_OTHER): Payer: Medicare HMO | Admitting: Nurse Practitioner

## 2021-01-27 ENCOUNTER — Encounter: Payer: Self-pay | Admitting: Nurse Practitioner

## 2021-01-27 VITALS — BP 130/80 | HR 102 | Temp 98.1°F | Resp 18 | Ht 64.0 in | Wt 163.0 lb

## 2021-01-27 DIAGNOSIS — Z79899 Other long term (current) drug therapy: Secondary | ICD-10-CM | POA: Diagnosis not present

## 2021-01-27 DIAGNOSIS — D649 Anemia, unspecified: Secondary | ICD-10-CM | POA: Diagnosis not present

## 2021-01-27 DIAGNOSIS — C49A4 Gastrointestinal stromal tumor of large intestine: Secondary | ICD-10-CM

## 2021-01-27 DIAGNOSIS — R197 Diarrhea, unspecified: Secondary | ICD-10-CM | POA: Diagnosis not present

## 2021-01-27 DIAGNOSIS — C49A5 Gastrointestinal stromal tumor of rectum: Secondary | ICD-10-CM | POA: Diagnosis not present

## 2021-01-27 LAB — CBC WITH DIFFERENTIAL (CANCER CENTER ONLY)
Abs Immature Granulocytes: 0.02 10*3/uL (ref 0.00–0.07)
Basophils Absolute: 0 10*3/uL (ref 0.0–0.1)
Basophils Relative: 0 %
Eosinophils Absolute: 0.1 10*3/uL (ref 0.0–0.5)
Eosinophils Relative: 1 %
HCT: 35.2 % — ABNORMAL LOW (ref 36.0–46.0)
Hemoglobin: 11.6 g/dL — ABNORMAL LOW (ref 12.0–15.0)
Immature Granulocytes: 0 %
Lymphocytes Relative: 31 %
Lymphs Abs: 2.2 10*3/uL (ref 0.7–4.0)
MCH: 28.4 pg (ref 26.0–34.0)
MCHC: 33 g/dL (ref 30.0–36.0)
MCV: 86.1 fL (ref 80.0–100.0)
Monocytes Absolute: 0.5 10*3/uL (ref 0.1–1.0)
Monocytes Relative: 8 %
Neutro Abs: 4.3 10*3/uL (ref 1.7–7.7)
Neutrophils Relative %: 60 %
Platelet Count: 354 10*3/uL (ref 150–400)
RBC: 4.09 MIL/uL (ref 3.87–5.11)
RDW: 14.5 % (ref 11.5–15.5)
WBC Count: 7.1 10*3/uL (ref 4.0–10.5)
nRBC: 0 % (ref 0.0–0.2)

## 2021-01-27 LAB — CMP (CANCER CENTER ONLY)
ALT: 12 U/L (ref 0–44)
AST: 17 U/L (ref 15–41)
Albumin: 3.9 g/dL (ref 3.5–5.0)
Alkaline Phosphatase: 111 U/L (ref 38–126)
Anion gap: 12 (ref 5–15)
BUN: 13 mg/dL (ref 8–23)
CO2: 23 mmol/L (ref 22–32)
Calcium: 9.1 mg/dL (ref 8.9–10.3)
Chloride: 108 mmol/L (ref 98–111)
Creatinine: 1.01 mg/dL — ABNORMAL HIGH (ref 0.44–1.00)
GFR, Estimated: 59 mL/min — ABNORMAL LOW (ref >60)
Glucose, Bld: 93 mg/dL (ref 70–99)
Potassium: 3.7 mmol/L (ref 3.5–5.1)
Sodium: 143 mmol/L (ref 135–145)
Total Bilirubin: 1.1 mg/dL (ref 0.3–1.2)
Total Protein: 7.7 g/dL (ref 6.5–8.1)

## 2021-01-27 LAB — MAGNESIUM: Magnesium: 1.7 mg/dL (ref 1.7–2.4)

## 2021-01-27 NOTE — Progress Notes (Signed)
[name]   Diagnosis: Gastrointestinal stromal tumor  INTERVAL HISTORY:   [name] returns prior to scheduled follow-up for evaluation of diarrhea.  She resumed Gleevec at a dose of 200 mg daily following the office visit 01/07/2021.  2 to 3 days later she began having diarrhea 8-10 times a day.  She discontinued Gleevec about 4 days ago.  She notes stool is more formed.  She continues to have frequent bowel movements.  No bleeding.  No nausea or vomiting.  No fever.  Objective:  Vital signs in last 24 hours:  Blood pressure 130/80, pulse (!) 102, temperature 98.1 F (36.7 C), resp. rate 18, height 5\' 4"  (1.626 m), weight 163 lb (73.9 kg), SpO2 100 %.    HEENT: No thrush or ulcers.  Mucous membranes appear moist. Resp: Lungs clear bilaterally. Cardio: Regular rate and rhythm. GI: Abdomen soft and nontender.  No hepatomegaly.  No mass.  Right abdominal hernia. Vascular: No leg edema.   Lab Results:  Lab Results  Component Value Date   WBC 7.1 01/27/2021   HGB 11.6 (L) 01/27/2021   HCT 35.2 (L) 01/27/2021   MCV 86.1 01/27/2021   PLT 354 01/27/2021   NEUTROABS 4.3 01/27/2021    Imaging:  No results found.  Medications: I have reviewed the patient's current medications.  Assessment/Plan: Gastrointestinal stromal tumor of the rectum Status post an endoscopic ultrasound on 08/06/2014 confirming a mass abutting the distal rectal wall with an FNA biopsy confirming a gastrointestinal stromal tumor Initiation of Gleevec 09/04/2014 MRI pelvis 12/15/2014 with interval decreased size of the large anorectal mass with central necrosis. Continuation of Gleevec Gleevec placed on hold 06/02/2015 in anticipation of surgery 06/04/2015 status post low anterior resection and diverting loop ileostomy, resection of tumor Pathology: 9.5 cm tumor; GISTsubtype spindle; mitotic rate 1/50 high-power field; positive margin of resection Adjuvant Gleevec  08/19/2015 (three-year in total course planned) Ileostomy takedown 01/28/2016 Held 01/28/2016 through 02/14/2016, resumed 02/15/2016 Gleevec stop 04/11/2016-04/18/2016, and 04/26/2016-05/08/2016 Gleevec resumed at a dose of 200 mg daily on 05/09/2016 Gleevec dose increased to 300 mg daily beginning 06/20/2016 Gleevec dose decreased to 200 mg daily beginning 06/23/2016 (poor tolerance at 300 mg dose) CTs 11/22/2017- enhancing soft tissue mass in the presacral space measuring 5.4 x 3.5 cm.  Enlarged pelvic lymph nodes. Gleevec continued at a dose of 200 mg daily Restaging CTs 03/11/2018- interval decrease in size of the presacral soft tissue mass.  Stable small pelvic lymph nodes. Gleevec discontinued 03/13/2018 CT 11/14/2018- new 2.4 cm left pelvic nodule at the piriformis muscle, stable presacral lymph nodes, right abdominal wall hernia, ventral hernia Gleevec 200 mg daily 12/04/2018 CTs 03/31/2019 -decrease in size of soft tissue nodule deep left pelvis.  5 nodules in the sigmoid mesocolon stable to slightly larger.  Stable presacral soft tissue thickening.  No findings for new metastatic disease. CT abdomen/pelvis 09/01/2019-progressive presacral and left perirectal nodularity.  Additional mesenteric nodules along the sigmoid mesocolon and left pelvis unchanged. Gleevec dose escalated to 300 mg daily 09/02/2019 Gleevec dose escalated to 400 mg alternating with 300 mg beginning 09/16/2019 Gleevec 300 mg daily beginning 09/30/2019 (unable to tolerate 400 mg due to diarrhea) CT 12/15/2019-pelvic/peritoneal nodularity-unchanged CT 06/02/2020-mildly worsened soft tissue density between the lower rectum and the sacrum. Mildly worsened superior anorectal adenopathy and perirectal adenopathy.  Office review of CT consistent with stable disease Gleevec 300 mg daily continued Gleevec dose reduced to 200 mg daily beginning 01/07/2021 (diarrhea) Gleevec placed on hold  01/27/2021 due to diarrhea Remote history of  pulmonary embolism-maintained on apixaban Multiple colon polyps noted on the colonoscopy 07/21/2014 with the pathology revealing tubular adenomas Hospitalization 06/21/2015 through 07/06/2015 with nausea/vomiting, high ileostomy output; found to have delayed gastric emptying. Water soluble contrast enema 08/13/2015 positive for a small to moderate volume of water soluble contrast leakage from the low anterior resection and anastomosis site in the pelvis Ileostomy takedown and resection of cystic abdominal wall mass ( benign pathology) on 01/28/2016 Anemia with ferritin in the low normal range 05/23/2016 and 06/20/2016. Ferrous sulfate increased to twice daily 06/26/2016. Improved.      Disposition: [facility] Walker appears stable.  She developed diarrhea after resuming Gleevec at a reduced dose.  We are placing Gleevec on hold.  She will submit a stool sample for C. difficile testing.  She will push fluids.  We will see her back in 1 to 2 weeks to reevaluate.  She will contact the office prior to her next visit if the diarrhea does not continue to improve.  Plan reviewed with Dr. Benay Spice.    Ned Card ANP/GNP-BC   01/27/2021  1:56 PM

## 2021-01-28 DIAGNOSIS — Z79899 Other long term (current) drug therapy: Secondary | ICD-10-CM | POA: Diagnosis not present

## 2021-01-28 DIAGNOSIS — C49A5 Gastrointestinal stromal tumor of rectum: Secondary | ICD-10-CM | POA: Diagnosis not present

## 2021-01-28 DIAGNOSIS — R197 Diarrhea, unspecified: Secondary | ICD-10-CM | POA: Diagnosis not present

## 2021-01-28 DIAGNOSIS — D649 Anemia, unspecified: Secondary | ICD-10-CM | POA: Diagnosis not present

## 2021-01-28 LAB — C DIFFICILE QUICK SCREEN W PCR REFLEX
C Diff antigen: NEGATIVE
C Diff toxin: NEGATIVE

## 2021-01-30 ENCOUNTER — Other Ambulatory Visit: Payer: Self-pay | Admitting: Nurse Practitioner

## 2021-01-31 ENCOUNTER — Other Ambulatory Visit (HOSPITAL_COMMUNITY): Payer: Self-pay

## 2021-02-02 ENCOUNTER — Other Ambulatory Visit (HOSPITAL_COMMUNITY): Payer: Self-pay

## 2021-02-09 ENCOUNTER — Inpatient Hospital Stay: Payer: Medicare HMO

## 2021-02-09 ENCOUNTER — Inpatient Hospital Stay: Payer: Medicare HMO | Admitting: Oncology

## 2021-02-21 ENCOUNTER — Ambulatory Visit (INDEPENDENT_AMBULATORY_CARE_PROVIDER_SITE_OTHER): Payer: Medicare HMO | Admitting: Internal Medicine

## 2021-02-21 ENCOUNTER — Encounter: Payer: Self-pay | Admitting: Internal Medicine

## 2021-02-21 ENCOUNTER — Other Ambulatory Visit: Payer: Self-pay

## 2021-02-21 ENCOUNTER — Other Ambulatory Visit: Payer: Self-pay | Admitting: Internal Medicine

## 2021-02-21 VITALS — BP 118/70 | HR 115 | Temp 98.3°F | Ht 64.0 in | Wt 154.0 lb

## 2021-02-21 DIAGNOSIS — K529 Noninfective gastroenteritis and colitis, unspecified: Secondary | ICD-10-CM

## 2021-02-21 DIAGNOSIS — R1030 Lower abdominal pain, unspecified: Secondary | ICD-10-CM

## 2021-02-21 DIAGNOSIS — R197 Diarrhea, unspecified: Secondary | ICD-10-CM

## 2021-02-21 DIAGNOSIS — I10 Essential (primary) hypertension: Secondary | ICD-10-CM | POA: Diagnosis not present

## 2021-02-21 DIAGNOSIS — R739 Hyperglycemia, unspecified: Secondary | ICD-10-CM | POA: Diagnosis not present

## 2021-02-21 DIAGNOSIS — C49A4 Gastrointestinal stromal tumor of large intestine: Secondary | ICD-10-CM

## 2021-02-21 DIAGNOSIS — N898 Other specified noninflammatory disorders of vagina: Secondary | ICD-10-CM

## 2021-02-21 MED ORDER — TRAMADOL HCL 50 MG PO TABS: 50.0000 mg | ORAL_TABLET | Freq: Four times a day (QID) | ORAL | 0 refills | Status: DC | PRN

## 2021-02-21 NOTE — Progress Notes (Signed)
Patient ID: [facility], female   DOB: March 07, 1946, 75 y.o.   MRN: [id number]

## 2021-02-21 NOTE — Progress Notes (Signed)
Patient ID: Brianna Walker, female   DOB: [birth year], 75 y.o.   MRN: [id number]        Chief Complaint: follow up HTN, diarrhea, wt loss, abd/pelvic/rectal pain       HPI:  Brianna Walker is a 75 y.o. female here with c/o > 2 mo of ongoing diarhea with wt loss, about 15 lbs, rectal pain with low abd discomfort mild to mod intermittent, no fever, and Denies worsening reflux, other abd pain, dysphagia, n/v, or blood.  Does mention she is certain stool is exiting through the vagina for several wks.  Pt denies chest pain, increased sob or doe, wheezing, orthopnea, PND, increased LE swelling, palpitations, dizziness or syncope.   Pt denies polydipsia, polyuria, or new focal neuro s/s.  Denies urinary symptoms such as dysuria, frequency, urgency, flank pain, hematuria or n/v, fever, chills.    Wt Readings from Last 3 Encounters:  02/21/21 154 lb (69.9 kg)  01/27/21 163 lb (73.9 kg)  01/07/21 169 lb 12.8 oz (77 kg)   BP Readings from Last 3 Encounters:  02/21/21 118/70  01/27/21 130/80  01/07/21 134/80         Past Medical History:  Diagnosis Date   AKI (acute kidney injury) ([place]) 06/21/2015   ALLERGIC RHINITIS 09/12/2007   Arthritis    BACK PAIN 06/19/2008   Cramp of limb 08/11/2009   DEEP VENOUS THROMBOPHLEBITIS, LEG, RIGHT 03/04/2010   right leg   Diverticulosis    FREQUENCY, URINARY 10/07/2009   GERD 12/05/2006   gist    dx. 4'16 -oral chemotherapy only.   HEMORRHOIDS 11/17/2006   HIP PAIN, RIGHT, CHRONIC 08/11/2009   HX, PERSONAL, TUBERCULOSIS 11/17/2006   HYPERLIPIDEMIA 09/12/2007   Hyperlipidemia 09/12/2007   Qualifier: Diagnosis of  By: Jenny Reichmann MD, Hunt Oris    HYPERTENSION 11/17/2006   INSOMNIA-SLEEP DISORDER-UNSPEC 06/19/2008   Iron deficiency anemia 12/07/2011   LEG PAIN, RIGHT 06/19/2008   Long term (current) use of anticoagulants 11/29/2010   MASS, SUPERFICIAL 09/12/2007   MENOPAUSAL DISORDER 09/20/2009   OVERACTIVE BLADDER 03/04/2010   Perforation of colon ([place]) 11/17/2015    Pneumonia    PULMONARY EMBOLISM, HX OF 11/17/2006   [facility] s/p Pneumonia   RECTAL BLEEDING 10/07/2007   Rectal mass 06/2014   SCIATICA, RIGHT 09/12/2007   Shortness of breath dyspnea    With exertion since chemotherapy tx-oral meds   SKIN LESION 07/20/2009   TMJ SYNDROME 11/17/2006   Tuberculosis    pt. was tx.   Tubular adenoma of colon 2016   Past Surgical History:  Procedure Laterality Date   ABDOMINAL HYSTERECTOMY     CHOLECYSTECTOMY OPEN     COLONOSCOPY     EUS N/A 08/06/2014   Procedure: LOWER ENDOSCOPIC ULTRASOUND (EUS);  Surgeon: Milus Banister, MD;  Location: [facility];  Service: Endoscopy;  Laterality: N/A;   EXCISION MASS ABDOMINAL N/A 01/28/2016   Procedure: EXCISION  ABDOMINAL WALL MASS, CYST, QUESTION FAT NECROSIS, QUESTION SEROMA;  Surgeon: Michael Boston, MD;  Location: [facility];  Service: General;  Laterality: N/A;   hemorroid surgery  removed at dr Silvio Pate office   x 2   ILEOSTOMY CLOSURE N/A 01/28/2016   Procedure: TAKEDOWN OF LOOP ILEOSTOMY;  Surgeon: Michael Boston, MD;  Location: [facility];  Service: General;  Laterality: N/A;   [place]  2002     multiple ventral hernia repair/mesh   INCISIONAL HERNIA REPAIR  01/13/2005   open w onlay Proceed mesh.  LAPAROSCOPIC LYSIS OF ADHESIONS  06/04/2015   Procedure: LAPAROSCOPIC LYSIS OF ADHESIONS;  Surgeon: Michael Boston, MD;  Location: [facility];  Service: General;;   PROCTOSCOPY N/A 01/28/2016   Procedure: RIGID PROCTOSCOPY, DILATION OF COLORECTAL ANASTOMOTIC STRICTURE;  Surgeon: Michael Boston, MD;  Location: [facility];  Service: General;  Laterality: N/A;   s/p right hip replaced min invasive hip surgury [facility] oct 2011 Right 2011   tumor removed off of right shoulder Right    XI ROBOTIC ASSISTED LOWER ANTERIOR RESECTION N/A 06/04/2015   Procedure: XI ROBOTIC ASSISTED LYSIS OF ADHESIONS, LOWER ANTERIOR RESECTION TRANS ABDOMINAL AND TRANS ANAL, COLOANAL HANDSEWN ANASTAOSIS, DIVERTING LOPE ILIOSTOMY,  RESECTION GIST TUMOR;  Surgeon: Michael Boston, MD;  Location: [facility];  Service: General;  Laterality: N/A;    reports that she quit smoking about 43 years ago. Her smoking use included cigarettes. She has never used smokeless tobacco. She reports current alcohol use. She reports that she does not use drugs. family history includes Colon cancer in her father; Diabetes in her sister; Heart disease in her mother and sister. No Known Allergies Current Outpatient Medications on File Prior to Visit  Medication Sig Dispense Refill   acetaminophen (TYLENOL) 500 MG tablet Take 1,000 mg by mouth every 6 (six) hours as needed for moderate pain or headache.     apixaban (ELIQUIS) 5 MG TABS tablet 1 tab by mouth twice per day 180 tablet 2   atorvastatin (LIPITOR) 40 MG tablet Take 1 tablet (40 mg total) by mouth daily. 90 tablet 3   benazepril (LOTENSIN) 40 MG tablet TAKE 1 TABLET EVERY EVENING 90 tablet 2   diphenoxylate-atropine (LOMOTIL) 2.5-0.025 MG tablet Take 1-2 tablets by mouth 4 times daily as needed for diarrhea or loose stool (max of 8 tablets daily) 60 tablet 0   estradiol (VIVELLE-DOT) 0.025 MG/24HR Place 1 patch onto the skin 2 (two) times a week. 24 patch 3   ferrous sulfate 325 (65 FE) MG tablet Take 1 tablet (325 mg total) by mouth 2 (two) times daily with a meal. 180 tablet 3   loperamide (IMODIUM) 2 MG capsule Take 1-2 capsules (2-4 mg total) by mouth every 8 (eight) hours as needed for diarrhea or loose stools (Use if >2 BM every 8 hours). 30 capsule 0   magnesium oxide (MAG-OX) 400 (240 Mg) MG tablet TAKE 1 TABLET EVERY DAY (Patient taking differently: Take 400 mg by mouth 2 (two) times daily.) 90 tablet 0   nystatin (MYCOSTATIN/NYSTOP) powder APPLY TOPICALLY THREE TIMES DAILY AS NEEDED 60 g 0   omeprazole (PRILOSEC) 40 MG capsule Take 1 capsule (40 mg total) by mouth daily. 90 capsule 3   potassium chloride (MICRO-K) 10 MEQ CR capsule TAKE 2 CAPSULES EVERY DAY (SUBSTITUTED FOR  MICRO-K) 180  capsule 2   Vitamin D, Ergocalciferol, (DRISDOL) 50000 units CAPS capsule Take 1 capsule (50,000 Units total) by mouth every 7 (seven) days. 12 capsule 0   imatinib (GLEEVEC) 100 MG tablet TAKE 3 TABLETS (300 MG TOTAL) BY MOUTH DAILY. PLEASE CALL OFFICE TO MAKE UP MISSED APPOINTMENT (Patient not taking: No sig reported) 90 tablet 1   solifenacin (VESICARE) 5 MG tablet Take 1 tablet (5 mg total) by mouth daily. (Patient not taking: No sig reported) 90 tablet 3   No current facility-administered medications on file prior to visit.        ROS:  All others reviewed and negative.  Objective        PE:  BP  118/70 (BP Location: Right Arm, Patient Position: Sitting, Cuff Size: Large)   Pulse (!) 115   Temp 98.3 F (36.8 C) (Oral)   Ht 5\' 4"  (1.626 m)   Wt 154 lb (69.9 kg)   SpO2 97%   BMI 26.43 kg/m                 Constitutional: Pt appears in NAD               HENT: Head: NCAT.                Right Ear: External ear normal.                 Left Ear: External ear normal.                Eyes: . Pupils are equal, round, and reactive to light. Conjunctivae and EOM are normal               Nose: without d/c or deformity               Neck: Neck supple. Gross normal ROM               Cardiovascular: Normal rate and regular rhythm.                 Pulmonary/Chest: Effort normal and breath sounds without rales or wheezing.                Abd:  Soft, low mid abd tender, ND, + BS, no organomegaly               Neurological: Pt is alert. At baseline orientation, motor grossly intact               Skin: Skin is warm. No rashes, no other new lesions, LE edema - none               Psychiatric: Pt behavior is normal without agitation   Micro: none  Cardiac tracings I have personally interpreted today:  none  Pertinent Radiological findings (summarize): none   Lab Results  Component Value Date   WBC 7.1 01/27/2021   HGB 11.6 (L) 01/27/2021   HCT 35.2 (L) 01/27/2021   PLT 354 01/27/2021    GLUCOSE 93 01/27/2021   CHOL 169 04/15/2018   TRIG 175.0 (H) 04/15/2018   HDL 38.80 (L) 04/15/2018   LDLDIRECT 49.0 09/14/2015   LDLCALC 95 04/15/2018   ALT 12 01/27/2021   AST 17 01/27/2021   NA 143 01/27/2021   K 3.7 01/27/2021   CL 108 01/27/2021   CREATININE 1.01 (H) 01/27/2021   BUN 13 01/27/2021   CO2 23 01/27/2021   TSH 0.09 (L) 04/15/2018   INR 0.94 01/26/2016   HGBA1C 5.7 04/15/2018   Assessment/Plan:  Ynez Eugenio Hapke is a 75 y.o. Black or African American [2] female with  has a past medical history of AKI (acute kidney injury) ([place]) (06/21/2015), ALLERGIC RHINITIS (09/12/2007), Arthritis, BACK PAIN (06/19/2008), Cramp of limb (08/11/2009), DEEP VENOUS THROMBOPHLEBITIS, LEG, RIGHT (03/04/2010), Diverticulosis, FREQUENCY, URINARY (10/07/2009), GERD (12/05/2006), gist, HEMORRHOIDS (11/17/2006), HIP PAIN, RIGHT, CHRONIC (08/11/2009), HX, PERSONAL, TUBERCULOSIS (11/17/2006), HYPERLIPIDEMIA (09/12/2007), Hyperlipidemia (09/12/2007), HYPERTENSION (11/17/2006), INSOMNIA-SLEEP DISORDER-UNSPEC (06/19/2008), Iron deficiency anemia (12/07/2011), LEG PAIN, RIGHT (06/19/2008), Long term (current) use of anticoagulants (11/29/2010), MASS, SUPERFICIAL (09/12/2007), MENOPAUSAL DISORDER (09/20/2009), OVERACTIVE BLADDER (03/04/2010), Perforation of colon ([place]) (11/17/2015), Pneumonia, PULMONARY EMBOLISM, HX OF (11/17/2006), RECTAL BLEEDING (10/07/2007), Rectal mass (06/2014), SCIATICA, RIGHT (09/12/2007),  Shortness of breath dyspnea, SKIN LESION (07/20/2009), TMJ SYNDROME (11/17/2006), Tuberculosis, and Tubular adenoma of colon (2016).  HTN (hypertension) Overcontrolled it seems, With wt loss now low normal, ok for decreased lotensin to 20 qd,  to f/u any worsening symptoms or concerns  Diarrhea Etiology unclear, for tramadol prn, GI panel, refer GI but also refer General surgury with possible colovaginal fistula by hx  Hyperglycemia Lab Results  Component Value Date   HGBA1C 5.7 04/15/2018   Stable, pt to continue  current medical treatment - diet  Followup: Return in about 4 months (around 06/24/2021).  Cathlean Cower, MD 02/28/2021 1:02 PM [facility]

## 2021-02-21 NOTE — Patient Instructions (Addendum)
Your Blood pressure was lower today after your weight loss and diarrhea -   Ok to decrease the Benazepril (lotensin) to HALF pill per day  Please take all new medication as prescribed - the tramadol as needed (and call for refills if needed)  Please continue all other medications as before, and refills have been done if requested.  Please have the pharmacy call with any other refills you may need.  Please continue your efforts at being more active, low cholesterol diet, and weight control.  You are otherwise up to date with prevention measures today.  Please keep your appointments with your specialists as you may have planned - Dr Daryel Gerald will be contacted regarding the referral for: Dr Leighton Ruff (colorectal surgury), and Dr Fuller Plan (GI)  Please go to the LAB at the blood drawing area for the tests to be done at the first floor (just the stool testing)  You will be contacted by phone if any changes need to be made immediately.  Otherwise, you will receive a letter about your results with an explanation, but please check with MyChart first.  Please remember to sign up for MyChart if you have not done so, as this will be important to you in the future with finding out test results, communicating by private email, and scheduling acute appointments online when needed.  Please make an Appointment to return in 4 months, or sooner if needed

## 2021-02-28 ENCOUNTER — Other Ambulatory Visit (HOSPITAL_COMMUNITY): Payer: Self-pay

## 2021-02-28 ENCOUNTER — Encounter: Payer: Self-pay | Admitting: Internal Medicine

## 2021-02-28 DIAGNOSIS — R197 Diarrhea, unspecified: Secondary | ICD-10-CM | POA: Insufficient documentation

## 2021-02-28 DIAGNOSIS — I10 Essential (primary) hypertension: Secondary | ICD-10-CM | POA: Insufficient documentation

## 2021-02-28 NOTE — Assessment & Plan Note (Signed)
Overcontrolled it seems, With wt loss now low normal, ok for decreased lotensin to 20 qd,  to f/u any worsening symptoms or concerns

## 2021-02-28 NOTE — Assessment & Plan Note (Signed)
Lab Results  Component Value Date   HGBA1C 5.7 04/15/2018   Stable, pt to continue current medical treatment - diet

## 2021-02-28 NOTE — Assessment & Plan Note (Signed)
Etiology unclear, for tramadol prn, GI panel, refer GI but also refer General surgury with possible colovaginal fistula by hx

## 2021-03-01 ENCOUNTER — Other Ambulatory Visit (HOSPITAL_COMMUNITY): Payer: Self-pay

## 2021-03-01 LAB — GASTROINTESTINAL PATHOGEN PNL

## 2021-03-03 ENCOUNTER — Inpatient Hospital Stay (HOSPITAL_BASED_OUTPATIENT_CLINIC_OR_DEPARTMENT_OTHER): Payer: Medicare HMO | Admitting: Nurse Practitioner

## 2021-03-03 ENCOUNTER — Other Ambulatory Visit: Payer: Self-pay

## 2021-03-03 ENCOUNTER — Encounter: Payer: Self-pay | Admitting: Nurse Practitioner

## 2021-03-03 ENCOUNTER — Inpatient Hospital Stay: Payer: Medicare HMO | Attending: Oncology

## 2021-03-03 VITALS — BP 120/80 | HR 101 | Temp 98.8°F | Resp 18 | Wt 151.2 lb

## 2021-03-03 DIAGNOSIS — Z86711 Personal history of pulmonary embolism: Secondary | ICD-10-CM | POA: Insufficient documentation

## 2021-03-03 DIAGNOSIS — C49A4 Gastrointestinal stromal tumor of large intestine: Secondary | ICD-10-CM

## 2021-03-03 DIAGNOSIS — Z79899 Other long term (current) drug therapy: Secondary | ICD-10-CM | POA: Diagnosis not present

## 2021-03-03 DIAGNOSIS — N823 Fistula of vagina to large intestine: Secondary | ICD-10-CM | POA: Insufficient documentation

## 2021-03-03 DIAGNOSIS — C49A5 Gastrointestinal stromal tumor of rectum: Secondary | ICD-10-CM | POA: Diagnosis not present

## 2021-03-03 DIAGNOSIS — D649 Anemia, unspecified: Secondary | ICD-10-CM | POA: Diagnosis not present

## 2021-03-03 LAB — CMP (CANCER CENTER ONLY)
ALT: 8 U/L (ref 0–44)
AST: 16 U/L (ref 15–41)
Albumin: 3.4 g/dL — ABNORMAL LOW (ref 3.5–5.0)
Alkaline Phosphatase: 86 U/L (ref 38–126)
Anion gap: 8 (ref 5–15)
BUN: 15 mg/dL (ref 8–23)
CO2: 28 mmol/L (ref 22–32)
Calcium: 9.1 mg/dL (ref 8.9–10.3)
Chloride: 106 mmol/L (ref 98–111)
Creatinine: 0.96 mg/dL (ref 0.44–1.00)
GFR, Estimated: >60 mL/min (ref >60)
Glucose, Bld: 93 mg/dL (ref 70–99)
Potassium: 3.8 mmol/L (ref 3.5–5.1)
Sodium: 142 mmol/L (ref 135–145)
Total Bilirubin: 0.7 mg/dL (ref 0.3–1.2)
Total Protein: 7.4 g/dL (ref 6.5–8.1)

## 2021-03-03 LAB — CBC WITH DIFFERENTIAL (CANCER CENTER ONLY)
Abs Immature Granulocytes: 0.04 10*3/uL (ref 0.00–0.07)
Basophils Absolute: 0.1 10*3/uL (ref 0.0–0.1)
Basophils Relative: 1 %
Eosinophils Absolute: 0.1 10*3/uL (ref 0.0–0.5)
Eosinophils Relative: 1 %
HCT: 36.7 % (ref 36.0–46.0)
Hemoglobin: 12 g/dL (ref 12.0–15.0)
Immature Granulocytes: 1 %
Lymphocytes Relative: 25 %
Lymphs Abs: 2.1 10*3/uL (ref 0.7–4.0)
MCH: 28 pg (ref 26.0–34.0)
MCHC: 32.7 g/dL (ref 30.0–36.0)
MCV: 85.5 fL (ref 80.0–100.0)
Monocytes Absolute: 0.5 10*3/uL (ref 0.1–1.0)
Monocytes Relative: 7 %
Neutro Abs: 5.5 10*3/uL (ref 1.7–7.7)
Neutrophils Relative %: 65 %
Platelet Count: 386 10*3/uL (ref 150–400)
RBC: 4.29 MIL/uL (ref 3.87–5.11)
RDW: 13.4 % (ref 11.5–15.5)
WBC Count: 8.2 10*3/uL (ref 4.0–10.5)
nRBC: 0 % (ref 0.0–0.2)

## 2021-03-03 NOTE — Progress Notes (Signed)
[facility] OFFICE PROGRESS NOTE   Diagnosis: Gastrointestinal stromal tumor  INTERVAL HISTORY:   [name] returns for follow-up.  She was last seen 01/27/2021.  She did not keep a scheduled follow-up appointment 02/09/2021.  About 3 weeks ago she developed increased rectal pain extending through to the vagina, rates 5 on the pain scale.  She was prescribed Ultram with no significant improvement.  Tylenol helps some.  Around that same time she began to notice stool from the vagina.  She sees blood mixed with stool.  Appetite has markedly diminished.  She is losing weight.  She constantly feels like she needs to have a bowel movement.  Objective:  Vital signs in last 24 hours:  Blood pressure 120/80, pulse (!) 101, temperature 98.8 F (37.1 C), temperature source Oral, resp. rate 18, weight 151 lb 3.2 oz (68.6 kg), SpO2 100 %.   Resp: Lungs clear bilaterally. Cardio: Regular rate and rhythm. GI: Firm fullness left mid to low abdomen.  Right abdominal hernia.  No hepatomegaly.  Digital rectal exam attempted, incomplete due to pain.  She had significant discomfort at the anterior rectum, felt irregular. Vascular: No leg edema.  Lab Results:  Lab Results  Component Value Date   WBC 8.2 03/03/2021   HGB 12.0 03/03/2021   HCT 36.7 03/03/2021   MCV 85.5 03/03/2021   PLT 386 03/03/2021   NEUTROABS 5.5 03/03/2021    Imaging:  No results found.  Medications: I have reviewed the patient's current medications.  Assessment/Plan: Gastrointestinal stromal tumor of the rectum Status post an endoscopic ultrasound on 08/06/2014 confirming a mass abutting the distal rectal wall with an FNA biopsy confirming a gastrointestinal stromal tumor Initiation of Gleevec 09/04/2014 MRI pelvis 12/15/2014 with interval decreased size of the large anorectal mass with central necrosis. Continuation of Gleevec Gleevec placed on hold 06/02/2015 in anticipation of surgery 06/04/2015  status post low anterior resection and diverting loop ileostomy, resection of tumor Pathology: 9.5 cm tumor; GISTsubtype spindle; mitotic rate 1/50 high-power field; positive margin of resection Adjuvant Gleevec 08/19/2015 (three-year in total course planned) Ileostomy takedown 01/28/2016 Held 01/28/2016 through 02/14/2016, resumed 02/15/2016 Gleevec stop 04/11/2016-04/18/2016, and 04/26/2016-05/08/2016 Gleevec resumed at a dose of 200 mg daily on 05/09/2016 Gleevec dose increased to 300 mg daily beginning 06/20/2016 Gleevec dose decreased to 200 mg daily beginning 06/23/2016 (poor tolerance at 300 mg dose) CTs 11/22/2017- enhancing soft tissue mass in the presacral space measuring 5.4 x 3.5 cm.  Enlarged pelvic lymph nodes. Gleevec continued at a dose of 200 mg daily Restaging CTs 03/11/2018- interval decrease in size of the presacral soft tissue mass.  Stable small pelvic lymph nodes. Gleevec discontinued 03/13/2018 CT 11/14/2018- new 2.4 cm left pelvic nodule at the piriformis muscle, stable presacral lymph nodes, right abdominal wall hernia, ventral hernia Gleevec 200 mg daily 12/04/2018 CTs 03/31/2019 -decrease in size of soft tissue nodule deep left pelvis.  5 nodules in the sigmoid mesocolon stable to slightly larger.  Stable presacral soft tissue thickening.  No findings for new metastatic disease. CT abdomen/pelvis 09/01/2019-progressive presacral and left perirectal nodularity.  Additional mesenteric nodules along the sigmoid mesocolon and left pelvis unchanged. Gleevec dose escalated to 300 mg daily 09/02/2019 Gleevec dose escalated to 400 mg alternating with 300 mg beginning 09/16/2019 Gleevec 300 mg daily beginning 09/30/2019 (unable to tolerate 400 mg due to diarrhea) CT 12/15/2019-pelvic/peritoneal nodularity-unchanged CT 06/02/2020-mildly worsened soft tissue density between the lower rectum and the sacrum. Mildly worsened superior anorectal adenopathy and perirectal adenopathy.  Office review  of CT consistent with stable disease Gleevec 300 mg daily continued Gleevec dose reduced to 200 mg daily beginning 01/07/2021 (diarrhea) Gleevec placed on hold 01/27/2021 due to diarrhea Remote history of pulmonary embolism-maintained on apixaban Multiple colon polyps noted on the colonoscopy 07/21/2014 with the pathology revealing tubular adenomas Hospitalization 06/21/2015 through 07/06/2015 with nausea/vomiting, high ileostomy output; found to have delayed gastric emptying. Water soluble contrast enema 08/13/2015 positive for a small to moderate volume of water soluble contrast leakage from the low anterior resection and anastomosis site in the pelvis Ileostomy takedown and resection of cystic abdominal wall mass ( benign pathology) on 01/28/2016 Anemia with ferritin in the low normal range 05/23/2016 and 06/20/2016. Ferrous sulfate increased to twice daily 06/26/2016. Improved.      Disposition: [name] presents with rectal pain, bleeding, stool from the vagina.  We discussed the likelihood of a fistula.  We are referring her for CT scans.  Dr. Benay Spice will also communicate with Dr. Johney Maine regarding the above.  [place] will remain on hold.  She will return for a follow-up visit next week.  Of note, she declines IV contrast.  We have requested oral and rectal contrast be administered.  Patient seen with Dr. Benay Spice.    Ned Card ANP/GNP-BC   03/03/2021  11:05 AM This was a shared visit with Ned Card.  Ms. Tregre was interviewed and examined.  On rectal exam there is irregular firm tissue anteriorly.  She is passing stool per vagina.  I suspect she has developed a rectovaginal fistula.  [place] will remain on hold.  She will be referred for an urgent CT.  I will contact Dr. Johney Maine.  I was present for greater than 50% of today's visit.  I performed medical decision making.  Julieanne Manson, MD

## 2021-03-04 ENCOUNTER — Other Ambulatory Visit: Payer: Self-pay | Admitting: Nurse Practitioner

## 2021-03-04 ENCOUNTER — Encounter (HOSPITAL_COMMUNITY): Payer: Self-pay

## 2021-03-04 ENCOUNTER — Other Ambulatory Visit (HOSPITAL_COMMUNITY): Payer: Self-pay

## 2021-03-04 ENCOUNTER — Ambulatory Visit (HOSPITAL_COMMUNITY)
Admission: RE | Admit: 2021-03-04 | Discharge: 2021-03-04 | Disposition: A | Discharge status: Home / Self Care | Payer: Medicare HMO | Source: Ambulatory Visit | Attending: Nurse Practitioner | Admitting: Nurse Practitioner

## 2021-03-04 DIAGNOSIS — R634 Abnormal weight loss: Secondary | ICD-10-CM | POA: Diagnosis not present

## 2021-03-04 DIAGNOSIS — K5730 Diverticulosis of large intestine without perforation or abscess without bleeding: Secondary | ICD-10-CM | POA: Diagnosis not present

## 2021-03-04 DIAGNOSIS — K449 Diaphragmatic hernia without obstruction or gangrene: Secondary | ICD-10-CM | POA: Diagnosis not present

## 2021-03-04 DIAGNOSIS — C49A4 Gastrointestinal stromal tumor of large intestine: Secondary | ICD-10-CM

## 2021-03-04 DIAGNOSIS — K529 Noninfective gastroenteritis and colitis, unspecified: Secondary | ICD-10-CM | POA: Diagnosis not present

## 2021-03-04 MED ORDER — IOHEXOL 9 MG/ML PO SOLN
ORAL | Status: AC
  Filled 2021-03-04: qty 1000

## 2021-03-04 MED ORDER — TRAMADOL HCL 50 MG PO TABS: 50.0000 mg | ORAL_TABLET | Freq: Three times a day (TID) | ORAL | 0 refills | Status: DC | PRN

## 2021-03-04 MED ORDER — IOHEXOL 9 MG/ML PO SOLN
1000.0000 mL | ORAL | Status: AC
  Administered 2021-03-04: 1000 mL via ORAL

## 2021-03-04 MED ORDER — IOHEXOL 9 MG/ML PO SOLN: 1000.0000 mL | ORAL | Status: DC

## 2021-03-09 ENCOUNTER — Other Ambulatory Visit (HOSPITAL_COMMUNITY): Payer: Self-pay

## 2021-03-10 ENCOUNTER — Other Ambulatory Visit (HOSPITAL_BASED_OUTPATIENT_CLINIC_OR_DEPARTMENT_OTHER): Payer: Self-pay

## 2021-03-10 ENCOUNTER — Inpatient Hospital Stay (HOSPITAL_BASED_OUTPATIENT_CLINIC_OR_DEPARTMENT_OTHER): Payer: Medicare HMO | Admitting: Nurse Practitioner

## 2021-03-10 ENCOUNTER — Encounter: Payer: Self-pay | Admitting: Nurse Practitioner

## 2021-03-10 ENCOUNTER — Ambulatory Visit: Payer: Medicare HMO | Attending: Internal Medicine

## 2021-03-10 ENCOUNTER — Other Ambulatory Visit: Payer: Self-pay

## 2021-03-10 VITALS — BP 134/80 | HR 100 | Temp 98.2°F | Resp 18 | Ht 64.0 in | Wt 148.6 lb

## 2021-03-10 DIAGNOSIS — Z86711 Personal history of pulmonary embolism: Secondary | ICD-10-CM | POA: Diagnosis not present

## 2021-03-10 DIAGNOSIS — D649 Anemia, unspecified: Secondary | ICD-10-CM | POA: Diagnosis not present

## 2021-03-10 DIAGNOSIS — C49A5 Gastrointestinal stromal tumor of rectum: Secondary | ICD-10-CM | POA: Diagnosis not present

## 2021-03-10 DIAGNOSIS — Z23 Encounter for immunization: Secondary | ICD-10-CM

## 2021-03-10 DIAGNOSIS — N823 Fistula of vagina to large intestine: Secondary | ICD-10-CM | POA: Diagnosis not present

## 2021-03-10 DIAGNOSIS — C49A4 Gastrointestinal stromal tumor of large intestine: Secondary | ICD-10-CM

## 2021-03-10 DIAGNOSIS — Z79899 Other long term (current) drug therapy: Secondary | ICD-10-CM | POA: Diagnosis not present

## 2021-03-10 MED ORDER — INFLUENZA VAC A&B SA ADJ QUAD 0.5 ML IM PRSY
PREFILLED_SYRINGE | INTRAMUSCULAR | 0 refills | Status: DC
  Filled 2021-03-10: qty 0.5, 1d supply, fill #0

## 2021-03-10 MED ORDER — PFIZER COVID-19 VAC BIVALENT 30 MCG/0.3ML IM SUSP
INTRAMUSCULAR | 0 refills | Status: DC
Start: 2021-03-10 — End: 2022-04-12
  Filled 2021-03-10: qty 0.3, 1d supply, fill #0

## 2021-03-10 NOTE — Progress Notes (Signed)
   Covid-19 Vaccination Clinic  Name:  Aizlynn Digilio    MRN: [id number] DOB: 04-15-1946  03/10/2021  Ms. Schier was observed post Covid-19 immunization for 15 minutes without incident. She was provided with Vaccine Information Sheet and instruction to access the V-Safe system.   Ms. Somma was instructed to call 911 with any severe reactions post vaccine: Difficulty breathing  Swelling of face and throat  A fast heartbeat  A bad rash all over body  Dizziness and weakness   Immunizations Administered     Name Date Dose VIS Date Route   Pfizer Covid-19 Vaccine Bivalent Booster 03/10/2021 11:26 AM 0.3 mL 12/29/2020 Intramuscular   Manufacturer: [place]   Lot: [id number]   [place]: [phone]

## 2021-03-10 NOTE — Progress Notes (Signed)
[place] OFFICE PROGRESS NOTE   Diagnosis: Gastrointestinal stromal tumor  INTERVAL HISTORY:   Brianna Walker returns as scheduled.  She is feeling better.  Bowel movements are more regular.  Pain is less.  No recent stool via the vagina.  Appetite has improved.  Objective:  Vital signs in last 24 hours:  Blood pressure 134/80, pulse 100, temperature 98.2 F (36.8 C), temperature source Oral, resp. rate 18, height 5\' 4"  (1.626 m), weight 148 lb 9.6 oz (67.4 kg), SpO2 98 %.    Resp: Lungs clear bilaterally. Cardio: Regular rate and rhythm. GI: No hepatosplenomegaly.  Firm fullness left medial low abdomen.  Right abdominal hernia. Vascular: No leg edema.   Lab Results:  Lab Results  Component Value Date   WBC 8.2 03/03/2021   HGB 12.0 03/03/2021   HCT 36.7 03/03/2021   MCV 85.5 03/03/2021   PLT 386 03/03/2021   NEUTROABS 5.5 03/03/2021    Imaging:  No results found.  Medications: I have reviewed the patient's current medications.  Assessment/Plan: Gastrointestinal stromal tumor of the rectum Status post an endoscopic ultrasound on 08/06/2014 confirming a mass abutting the distal rectal wall with an FNA biopsy confirming a gastrointestinal stromal tumor Initiation of Gleevec 09/04/2014 MRI pelvis 12/15/2014 with interval decreased size of the large anorectal mass with central necrosis. Continuation of Gleevec Gleevec placed on hold 06/02/2015 in anticipation of surgery 06/04/2015 status post low anterior resection and diverting loop ileostomy, resection of tumor Pathology: 9.5 cm tumor; GISTsubtype spindle; mitotic rate 1/50 high-power field; positive margin of resection Adjuvant Gleevec 08/19/2015 (three-year in total course planned) Ileostomy takedown 01/28/2016 Held 01/28/2016 through 02/14/2016, resumed 02/15/2016 Gleevec stop 04/11/2016-04/18/2016, and 04/26/2016-05/08/2016 Gleevec resumed at a dose of 200 mg daily on 05/09/2016 Gleevec dose  increased to 300 mg daily beginning 06/20/2016 Gleevec dose decreased to 200 mg daily beginning 06/23/2016 (poor tolerance at 300 mg dose) CTs 11/22/2017- enhancing soft tissue mass in the presacral space measuring 5.4 x 3.5 cm.  Enlarged pelvic lymph nodes. Gleevec continued at a dose of 200 mg daily Restaging CTs 03/11/2018- interval decrease in size of the presacral soft tissue mass.  Stable small pelvic lymph nodes. Gleevec discontinued 03/13/2018 CT 11/14/2018- new 2.4 cm left pelvic nodule at the piriformis muscle, stable presacral lymph nodes, right abdominal wall hernia, ventral hernia Gleevec 200 mg daily 12/04/2018 CTs 03/31/2019 -decrease in size of soft tissue nodule deep left pelvis.  5 nodules in the sigmoid mesocolon stable to slightly larger.  Stable presacral soft tissue thickening.  No findings for new metastatic disease. CT abdomen/pelvis 09/01/2019-progressive presacral and left perirectal nodularity.  Additional mesenteric nodules along the sigmoid mesocolon and left pelvis unchanged. Gleevec dose escalated to 300 mg daily 09/02/2019 Gleevec dose escalated to 400 mg alternating with 300 mg beginning 09/16/2019 Gleevec 300 mg daily beginning 09/30/2019 (unable to tolerate 400 mg due to diarrhea) CT 12/15/2019-pelvic/peritoneal nodularity-unchanged CT 06/02/2020-mildly worsened soft tissue density between the lower rectum and the sacrum. Mildly worsened superior anorectal adenopathy and perirectal adenopathy.  Office review of CT consistent with stable disease Gleevec 300 mg daily continued Gleevec dose reduced to 200 mg daily beginning 01/07/2021 (diarrhea) Gleevec placed on hold 01/27/2021 due to diarrhea CT abdomen/pelvis 03/04/2021-no irregular annular wall thickening in the remnant rectum contiguous with presacral space, compatible with recurrent tumor.  Small rectovaginal fistula identified.  Small amount of gas and rectal contrast in the vagina and vaginal introitus.  Interval progression  of metastatic adenopathy in the left perirectal,  lower left pelvic sidewall and high presacral chains. Remote history of pulmonary embolism-maintained on apixaban Multiple colon polyps noted on the colonoscopy 07/21/2014 with the pathology revealing tubular adenomas Hospitalization 06/21/2015 through 07/06/2015 with nausea/vomiting, high ileostomy output; found to have delayed gastric emptying. Water soluble contrast enema 08/13/2015 positive for a small to moderate volume of water soluble contrast leakage from the low anterior resection and anastomosis site in the pelvis Ileostomy takedown and resection of cystic abdominal wall mass ( benign pathology) on 01/28/2016 Anemia with ferritin in the low normal range 05/23/2016 and 06/20/2016. Ferrous sulfate increased to twice daily 06/26/2016. Improved.      Disposition: Brianna Walker appears stable.  She overall is feeling better.  Dr. Benay Spice reviewed the CT report/images with her and her sister at today's visit.  They understand there is evidence of progression of the gastrointestinal stromal tumor and a rectovaginal fistula.  Dr. Benay Spice recommends she resume Gleevec 200 mg daily.  She is scheduled to see Dr. Johney Maine 03/21/2021 to discuss any surgical options.  She will return for follow-up here 03/23/2021 to discuss systemic therapy options.  Patient seen with Dr. Benay Spice.    Ned Card ANP/GNP-BC   03/10/2021  10:41 AM  This was a shared visit with Ned Card.  We reviewed the CT findings and images with Brianna Walker.  We discussed treatment options.  He is scheduled to see Dr. Johney Maine on 03/21/2021 to discuss surgical options.  She indicates she will not agree to a procedure which requires a colostomy.  We recommended she continue Gleevec.  Her case will be presented at the GI tumor conference next week.  I will recommend treatment with sunitinib or ripretinib if she does not have surgery.  I was present for greater than 50% of today's  visit.  I performed medical decision making.  Julieanne Manson, MD

## 2021-03-14 ENCOUNTER — Other Ambulatory Visit (HOSPITAL_COMMUNITY): Payer: Self-pay

## 2021-03-16 ENCOUNTER — Other Ambulatory Visit: Payer: Self-pay

## 2021-03-16 NOTE — Progress Notes (Signed)
The proposed treatment discussed in conference is for discussion purpose only and is not a binding recommendation.  The patients have not been physically examined, or presented with their treatment options.  Therefore, final treatment plans cannot be decided.  

## 2021-03-21 DIAGNOSIS — K432 Incisional hernia without obstruction or gangrene: Secondary | ICD-10-CM | POA: Diagnosis not present

## 2021-03-21 DIAGNOSIS — N823 Fistula of vagina to large intestine: Secondary | ICD-10-CM | POA: Diagnosis not present

## 2021-03-21 DIAGNOSIS — C49A5 Gastrointestinal stromal tumor of rectum: Secondary | ICD-10-CM | POA: Diagnosis not present

## 2021-03-22 ENCOUNTER — Other Ambulatory Visit (HOSPITAL_COMMUNITY): Payer: Self-pay

## 2021-03-23 ENCOUNTER — Inpatient Hospital Stay: Payer: Medicare HMO | Admitting: Oncology

## 2021-03-23 ENCOUNTER — Other Ambulatory Visit (HOSPITAL_COMMUNITY): Payer: Self-pay

## 2021-03-25 ENCOUNTER — Other Ambulatory Visit (HOSPITAL_COMMUNITY): Payer: Self-pay

## 2021-03-31 ENCOUNTER — Other Ambulatory Visit (HOSPITAL_COMMUNITY): Payer: Self-pay

## 2021-03-31 ENCOUNTER — Other Ambulatory Visit: Payer: Self-pay

## 2021-03-31 ENCOUNTER — Telehealth: Payer: Self-pay | Admitting: Pharmacy Technician

## 2021-03-31 ENCOUNTER — Telehealth: Payer: Self-pay | Admitting: Pharmacist

## 2021-03-31 ENCOUNTER — Inpatient Hospital Stay: Payer: Medicare HMO | Attending: Oncology | Admitting: Oncology

## 2021-03-31 VITALS — BP 145/80 | HR 80 | Temp 98.7°F | Resp 18 | Ht 64.0 in | Wt 148.6 lb

## 2021-03-31 DIAGNOSIS — C49A5 Gastrointestinal stromal tumor of rectum: Secondary | ICD-10-CM | POA: Diagnosis not present

## 2021-03-31 DIAGNOSIS — D649 Anemia, unspecified: Secondary | ICD-10-CM | POA: Insufficient documentation

## 2021-03-31 DIAGNOSIS — Z86711 Personal history of pulmonary embolism: Secondary | ICD-10-CM | POA: Diagnosis not present

## 2021-03-31 DIAGNOSIS — C49A4 Gastrointestinal stromal tumor of large intestine: Secondary | ICD-10-CM

## 2021-03-31 DIAGNOSIS — R110 Nausea: Secondary | ICD-10-CM | POA: Insufficient documentation

## 2021-03-31 DIAGNOSIS — Z79899 Other long term (current) drug therapy: Secondary | ICD-10-CM | POA: Diagnosis not present

## 2021-03-31 DIAGNOSIS — N823 Fistula of vagina to large intestine: Secondary | ICD-10-CM | POA: Insufficient documentation

## 2021-03-31 MED ORDER — SUNITINIB MALATE 37.5 MG PO CAPS
37.5000 mg | ORAL_CAPSULE | Freq: Every day | ORAL | 0 refills | Status: DC
Start: 2021-03-31 — End: 2021-04-28
  Filled 2021-03-31: qty 56, 56d supply, fill #0
  Filled 2021-04-01: qty 28, 28d supply, fill #0

## 2021-03-31 MED ORDER — ONDANSETRON HCL 8 MG PO TABS: 8.0000 mg | ORAL_TABLET | Freq: Three times a day (TID) | ORAL | 0 refills | Status: DC | PRN

## 2021-03-31 NOTE — Telephone Encounter (Signed)
Oral Oncology Pharmacist Encounter  Received new prescription for sunitinib (Sutent) for the treatment of gastrointestinal stromal tumor (GIST), planned duration until disease progression or unacceptable drug toxicity.  Labs from 03/03/21 assessed, no relevant lab abnormalities. Prescription dose and frequency assessed.   Current medication list in Epic reviewed, no relevant DDIs with sunitinib identified.  Evaluated chart and no patient barriers to medication adherence identified.   Prescription has been e-scribed to the [facility] for benefits analysis and approval.  Oral Oncology Clinic will continue to follow for insurance authorization, copayment issues, initial counseling and start date.  Patient agreed to treatment on 03/31/21 per MD documentation.  Benn Moulder, PharmD Pharmacy Resident  03/31/2021 3:45 PM

## 2021-03-31 NOTE — Progress Notes (Signed)
[place] OFFICE PROGRESS NOTE   Diagnosis: Gastrointestinal stromal tumor  INTERVAL HISTORY:   Brianna Walker returns as scheduled.  She saw Dr. Johney Maine and decided against surgery.  She reports resolution of the stool per vagina after starting Metamucil.  She feels well.  She has developed intermittent nausea over the past few days.  She had one episode of emesis while eating.  Objective:  Vital signs in last 24 hours:  Blood pressure (!) 145/80, pulse 80, temperature 98.7 F (37.1 C), temperature source Oral, resp. rate 18, height 5\' 4"  (1.626 m), weight 148 lb 9.6 oz (67.4 kg), SpO2 100 %.    HEENT: No thrush or ulcers Resp: Lungs clear bilaterally Cardio: Regular rate and rhythm GI: No hepatosplenomegaly, soft, nontender, right lower abdomen hernia Vascular: No leg edema  Skin: No rash  Lab Results:  Lab Results  Component Value Date   WBC 8.2 03/03/2021   HGB 12.0 03/03/2021   HCT 36.7 03/03/2021   MCV 85.5 03/03/2021   PLT 386 03/03/2021   NEUTROABS 5.5 03/03/2021    CMP  Lab Results  Component Value Date   NA 142 03/03/2021   K 3.8 03/03/2021   CL 106 03/03/2021   CO2 28 03/03/2021   GLUCOSE 93 03/03/2021   BUN 15 03/03/2021   CREATININE 0.96 03/03/2021   CALCIUM 9.1 03/03/2021   PROT 7.4 03/03/2021   ALBUMIN 3.4 (L) 03/03/2021   AST 16 03/03/2021   ALT 8 03/03/2021   ALKPHOS 86 03/03/2021   BILITOT 0.7 03/03/2021   GFRNONAA >60 03/03/2021   GFRAA >60 12/05/2019     Medications: I have reviewed the patient's current medications.   Assessment/Plan: Gastrointestinal stromal tumor of the rectum Status post an endoscopic ultrasound on 08/06/2014 confirming a mass abutting the distal rectal wall with an FNA biopsy confirming a gastrointestinal stromal tumor Initiation of Gleevec 09/04/2014 MRI pelvis 12/15/2014 with interval decreased size of the large anorectal mass with central necrosis. Continuation of Gleevec Gleevec placed on  hold 06/02/2015 in anticipation of surgery 06/04/2015 status post low anterior resection and diverting loop ileostomy, resection of tumor Pathology: 9.5 cm tumor; GISTsubtype spindle; mitotic rate 1/50 high-power field; positive margin of resection Adjuvant Gleevec 08/19/2015 (three-year in total course planned) Ileostomy takedown 01/28/2016 Held 01/28/2016 through 02/14/2016, resumed 02/15/2016 Gleevec stop 04/11/2016-04/18/2016, and 04/26/2016-05/08/2016 Gleevec resumed at a dose of 200 mg daily on 05/09/2016 Gleevec dose increased to 300 mg daily beginning 06/20/2016 Gleevec dose decreased to 200 mg daily beginning 06/23/2016 (poor tolerance at 300 mg dose) CTs 11/22/2017- enhancing soft tissue mass in the presacral space measuring 5.4 x 3.5 cm.  Enlarged pelvic lymph nodes. Gleevec continued at a dose of 200 mg daily Restaging CTs 03/11/2018- interval decrease in size of the presacral soft tissue mass.  Stable small pelvic lymph nodes. Gleevec discontinued 03/13/2018 CT 11/14/2018- new 2.4 cm left pelvic nodule at the piriformis muscle, stable presacral lymph nodes, right abdominal wall hernia, ventral hernia Gleevec 200 mg daily 12/04/2018 CTs 03/31/2019 -decrease in size of soft tissue nodule deep left pelvis.  5 nodules in the sigmoid mesocolon stable to slightly larger.  Stable presacral soft tissue thickening.  No findings for new metastatic disease. CT abdomen/pelvis 09/01/2019-progressive presacral and left perirectal nodularity.  Additional mesenteric nodules along the sigmoid mesocolon and left pelvis unchanged. Gleevec dose escalated to 300 mg daily 09/02/2019 Gleevec dose escalated to 400 mg alternating with 300 mg beginning 09/16/2019 Gleevec 300 mg daily beginning 09/30/2019 (unable to  tolerate 400 mg due to diarrhea) CT 12/15/2019-pelvic/peritoneal nodularity-unchanged CT 06/02/2020-mildly worsened soft tissue density between the lower rectum and the sacrum. Mildly worsened superior  anorectal adenopathy and perirectal adenopathy.  Office review of CT consistent with stable disease Gleevec 300 mg daily continued Gleevec dose reduced to 200 mg daily beginning 01/07/2021 (diarrhea) Gleevec placed on hold 01/27/2021 due to diarrhea CT abdomen/pelvis 03/04/2021-no irregular annular wall thickening in the remnant rectum contiguous with presacral space, compatible with recurrent tumor.  Small rectovaginal fistula identified.  Small amount of gas and rectal contrast in the vagina and vaginal introitus.  Interval progression of metastatic adenopathy in the left perirectal, lower left pelvic sidewall and high presacral chains. Remote history of pulmonary embolism-maintained on apixaban Multiple colon polyps noted on the colonoscopy 07/21/2014 with the pathology revealing tubular adenomas Hospitalization 06/21/2015 through 07/06/2015 with nausea/vomiting, high ileostomy output; found to have delayed gastric emptying. Water soluble contrast enema 08/13/2015 positive for a small to moderate volume of water soluble contrast leakage from the low anterior resection and anastomosis site in the pelvis Ileostomy takedown and resection of cystic abdominal wall mass ( benign pathology) on 01/28/2016 Anemia with ferritin in the low normal range 05/23/2016 and 06/20/2016. Ferrous sulfate increased to twice daily 06/26/2016. Improved.     Disposition: [name] has metastatic gastrointestinal stromal tumor.  There has been recent clinical and radiologic evidence of disease progression.  She saw Dr. Johney Maine last week.  Surgery would require a colostomy and potentially pelvic exoneration.  She decided against surgery.  I discussed treatment options with [name].  I recommend changing to a different tyrosine kinase inhibitor.  She will begin a trial of sunitinib.  We reviewed potential toxicities associated with sunitinib including the chance of mucositis, diarrhea, rash, palmar plantar dysesthesia,  hematologic toxicity, bleeding, bowel perforation, and delayed wound healing.  She understands the potential of worsening of the rectovaginal fistula while on sunitinib.  She agrees to proceed.  She will discontinue imatinib 2 days prior to beginning sunitinib.  She will return for an office and lab visit approximately 2 weeks after starting sunitinib.  Ms. Dadisman reports nausea for the past few days.  We prescribe Zofran to use as needed.  She will call if the nausea persists.  Betsy Coder, MD  03/31/2021  8:20 AM

## 2021-03-31 NOTE — Telephone Encounter (Signed)
Oral Oncology Patient Advocate Encounter  Prior Authorization for Sutent has been approved.    PA# [id number] Effective dates: 03/31/21 through 09/27/21  Patients co-pay is $0.00  Oral Oncology Clinic will continue to follow.   [place] Patient [place] Phone [phone] Fax [phone] 03/31/2021 10:41 AM

## 2021-03-31 NOTE — Telephone Encounter (Signed)
Oral Oncology Patient Advocate Encounter   Received notification from [facility] that prior authorization for Sutent is required.   PA submitted on CoverMyMeds Key [id number] Status is pending   Oral Oncology Clinic will continue to follow.  [place] Patient [place] Phone [phone] Fax [phone] 03/31/2021 10:35 AM

## 2021-04-01 ENCOUNTER — Other Ambulatory Visit (HOSPITAL_COMMUNITY): Payer: Self-pay

## 2021-04-01 NOTE — Telephone Encounter (Signed)
Oral Chemotherapy Pharmacist Encounter  Sutent will arrive on Monday, 12/5. Pt will take the last dose of imatinib today, 12/2, and start Sutent on 12/5 after arrival.   I spoke with patient for overview of: Sutent (sunitinib) for the treatment of gastrointestinal stromal tumor (GIST), planned duration until disease progression or unacceptable toxicity.   Counseled patient on administration, dosing, side effects, monitoring, drug-food interactions, safe handling, storage, and disposal.  Patient will take Sutent 37.5 mg capsules, 1 capsule by mouth once daily with or without food.  Patient knows to aviod grapefruit and grapefruit juice while on Sutent.  Sutent start date: 04/04/21  Adverse effects include but are not limited to: fatigue, diarrhea, nausea, vomiting, mouth sores, rash, hand-foot syndrome, bleeding events, and altered cardiac function.  Patient has anti-emetic on hand and knows to take it if nausea develops.   Patient will obtain anti diarrheal and alert the office of 4 or more loose stools above baseline.  Patient informed Sutent should be temporarily interrupted for major surgical procedures and can be restarted upon recovery from surgery.     Reviewed with patient importance of keeping a medication schedule and plan for any missed doses. No barriers to medication adherence identified.  Medication reconciliation performed and medication/allergy list updated.  Insurance authorization for Sutent has been obtained. Test claim at the pharmacy revealed copayment $0 for 1st fill of Sutent. This will ship from the [facility] on 04/01/21 to deliver to patient's home on 04/04/21.  Patient informed the pharmacy will reach out 5-7 days prior to needing next fill of Sutent to coordinate continued medication acquisition to prevent break in therapy.  All questions answered.  Ms. Politte voiced understanding and appreciation.   Medication education handout placed  in mail for patient. Patient knows to call the office with questions or concerns. Oral Chemotherapy Clinic phone number provided to patient.   Benn Moulder, PharmD Pharmacy Resident  04/01/2021 9:57 AM

## 2021-04-06 ENCOUNTER — Other Ambulatory Visit (HOSPITAL_COMMUNITY): Payer: Self-pay

## 2021-04-06 ENCOUNTER — Other Ambulatory Visit: Payer: Self-pay

## 2021-04-06 DIAGNOSIS — C49A4 Gastrointestinal stromal tumor of large intestine: Secondary | ICD-10-CM

## 2021-04-06 MED ORDER — MAGNESIUM OXIDE -MG SUPPLEMENT 400 (240 MG) MG PO TABS: 1.0000 | ORAL_TABLET | Freq: Every day | ORAL | 0 refills | Status: DC

## 2021-04-13 ENCOUNTER — Other Ambulatory Visit: Payer: Self-pay | Admitting: Internal Medicine

## 2021-04-13 NOTE — Telephone Encounter (Signed)
Please refill as per office routine med refill policy (all routine meds to be refilled for 3 mo or monthly (per pt preference) up to one year from last visit, then month to month grace period for 3 mo, then further med refills will have to be denied) ? ?

## 2021-04-19 ENCOUNTER — Other Ambulatory Visit (HOSPITAL_COMMUNITY): Payer: Self-pay

## 2021-04-21 ENCOUNTER — Inpatient Hospital Stay (HOSPITAL_BASED_OUTPATIENT_CLINIC_OR_DEPARTMENT_OTHER): Payer: Medicare HMO | Admitting: Nurse Practitioner

## 2021-04-21 ENCOUNTER — Inpatient Hospital Stay: Payer: Medicare HMO

## 2021-04-21 ENCOUNTER — Other Ambulatory Visit: Payer: Self-pay

## 2021-04-21 ENCOUNTER — Encounter: Payer: Self-pay | Admitting: Nurse Practitioner

## 2021-04-21 VITALS — BP 140/84 | HR 100 | Temp 98.1°F | Resp 18 | Ht 64.0 in | Wt 141.6 lb

## 2021-04-21 DIAGNOSIS — N823 Fistula of vagina to large intestine: Secondary | ICD-10-CM | POA: Diagnosis not present

## 2021-04-21 DIAGNOSIS — C49A4 Gastrointestinal stromal tumor of large intestine: Secondary | ICD-10-CM

## 2021-04-21 DIAGNOSIS — R110 Nausea: Secondary | ICD-10-CM | POA: Diagnosis not present

## 2021-04-21 DIAGNOSIS — C49A5 Gastrointestinal stromal tumor of rectum: Secondary | ICD-10-CM | POA: Diagnosis not present

## 2021-04-21 DIAGNOSIS — Z86711 Personal history of pulmonary embolism: Secondary | ICD-10-CM | POA: Diagnosis not present

## 2021-04-21 DIAGNOSIS — Z79899 Other long term (current) drug therapy: Secondary | ICD-10-CM | POA: Diagnosis not present

## 2021-04-21 DIAGNOSIS — D649 Anemia, unspecified: Secondary | ICD-10-CM | POA: Diagnosis not present

## 2021-04-21 LAB — CMP (CANCER CENTER ONLY)
ALT: 14 U/L (ref 0–44)
AST: 21 U/L (ref 15–41)
Albumin: 3.8 g/dL (ref 3.5–5.0)
Alkaline Phosphatase: 116 U/L (ref 38–126)
Anion gap: 11 (ref 5–15)
BUN: 20 mg/dL (ref 8–23)
CO2: 23 mmol/L (ref 22–32)
Calcium: 9.4 mg/dL (ref 8.9–10.3)
Chloride: 105 mmol/L (ref 98–111)
Creatinine: 1.77 mg/dL — ABNORMAL HIGH (ref 0.44–1.00)
GFR, Estimated: 30 mL/min — ABNORMAL LOW (ref >60)
Glucose, Bld: 78 mg/dL (ref 70–99)
Potassium: 3.7 mmol/L (ref 3.5–5.1)
Sodium: 139 mmol/L (ref 135–145)
Total Bilirubin: 2.9 mg/dL — ABNORMAL HIGH (ref 0.3–1.2)
Total Protein: 7.5 g/dL (ref 6.5–8.1)

## 2021-04-21 LAB — CBC WITH DIFFERENTIAL (CANCER CENTER ONLY)
Abs Immature Granulocytes: 0.01 10*3/uL (ref 0.00–0.07)
Basophils Absolute: 0 10*3/uL (ref 0.0–0.1)
Basophils Relative: 1 %
Eosinophils Absolute: 0.1 10*3/uL (ref 0.0–0.5)
Eosinophils Relative: 1 %
HCT: 38 % (ref 36.0–46.0)
Hemoglobin: 12.2 g/dL (ref 12.0–15.0)
Immature Granulocytes: 0 %
Lymphocytes Relative: 37 %
Lymphs Abs: 2.1 10*3/uL (ref 0.7–4.0)
MCH: 28.2 pg (ref 26.0–34.0)
MCHC: 32.1 g/dL (ref 30.0–36.0)
MCV: 88 fL (ref 80.0–100.0)
Monocytes Absolute: 0.3 10*3/uL (ref 0.1–1.0)
Monocytes Relative: 5 %
Neutro Abs: 3.1 10*3/uL (ref 1.7–7.7)
Neutrophils Relative %: 56 %
Platelet Count: 179 10*3/uL (ref 150–400)
RBC: 4.32 MIL/uL (ref 3.87–5.11)
RDW: 14.5 % (ref 11.5–15.5)
WBC Count: 5.6 10*3/uL (ref 4.0–10.5)
nRBC: 0 % (ref 0.0–0.2)

## 2021-04-21 LAB — PHOSPHORUS: Phosphorus: 3.6 mg/dL (ref 2.5–4.6)

## 2021-04-21 LAB — MAGNESIUM: Magnesium: 1.4 mg/dL — ABNORMAL LOW (ref 1.7–2.4)

## 2021-04-21 NOTE — Progress Notes (Signed)
[facility] OFFICE PROGRESS NOTE   Diagnosis: Gastrointestinal stromal tumor  INTERVAL HISTORY:   Brianna Walker returns as scheduled.  She began sunitinib 04/04/2021.  She reports continued fatigue.  Appetite varies.  She notes an alteration in taste.  Intermittent mild nausea.  She has periodic diarrhea.  No longer having stool from the vagina.  Objective:  Vital signs in last 24 hours:  Blood pressure 140/84, pulse 100, temperature 98.1 F (36.7 C), resp. rate 18, height 5\' 4"  (1.626 m), weight 141 lb 9.6 oz (64.2 kg), SpO2 100 %.    HEENT: No thrush or ulcers. Resp: Lungs clear bilaterally. Cardio: Regular rate and rhythm. GI: Abdomen soft and nontender.  No hepatosplenomegaly.  Right lower abdomen hernia. Vascular: No leg edema. Skin: Palms without erythema.   Lab Results:  Lab Results  Component Value Date   WBC 5.6 04/21/2021   HGB 12.2 04/21/2021   HCT 38.0 04/21/2021   MCV 88.0 04/21/2021   PLT 179 04/21/2021   NEUTROABS 3.1 04/21/2021    Imaging:  No results found.  Medications: I have reviewed the patient's current medications.  Assessment/Plan: Gastrointestinal stromal tumor of the rectum Status post an endoscopic ultrasound on 08/06/2014 confirming a mass abutting the distal rectal wall with an FNA biopsy confirming a gastrointestinal stromal tumor Initiation of Gleevec 09/04/2014 MRI pelvis 12/15/2014 with interval decreased size of the large anorectal mass with central necrosis. Continuation of Gleevec Gleevec placed on hold 06/02/2015 in anticipation of surgery 06/04/2015 status post low anterior resection and diverting loop ileostomy, resection of tumor Pathology: 9.5 cm tumor; GISTsubtype spindle; mitotic rate 1/50 high-power field; positive margin of resection Adjuvant Gleevec 08/19/2015 (three-year in total course planned) Ileostomy takedown 01/28/2016 Held 01/28/2016 through 02/14/2016, resumed 02/15/2016 Gleevec stop  04/11/2016-04/18/2016, and 04/26/2016-05/08/2016 Gleevec resumed at a dose of 200 mg daily on 05/09/2016 Gleevec dose increased to 300 mg daily beginning 06/20/2016 Gleevec dose decreased to 200 mg daily beginning 06/23/2016 (poor tolerance at 300 mg dose) CTs 11/22/2017- enhancing soft tissue mass in the presacral space measuring 5.4 x 3.5 cm.  Enlarged pelvic lymph nodes. Gleevec continued at a dose of 200 mg daily Restaging CTs 03/11/2018- interval decrease in size of the presacral soft tissue mass.  Stable small pelvic lymph nodes. Gleevec discontinued 03/13/2018 CT 11/14/2018- new 2.4 cm left pelvic nodule at the piriformis muscle, stable presacral lymph nodes, right abdominal wall hernia, ventral hernia Gleevec 200 mg daily 12/04/2018 CTs 03/31/2019 -decrease in size of soft tissue nodule deep left pelvis.  5 nodules in the sigmoid mesocolon stable to slightly larger.  Stable presacral soft tissue thickening.  No findings for new metastatic disease. CT abdomen/pelvis 09/01/2019-progressive presacral and left perirectal nodularity.  Additional mesenteric nodules along the sigmoid mesocolon and left pelvis unchanged. Gleevec dose escalated to 300 mg daily 09/02/2019 Gleevec dose escalated to 400 mg alternating with 300 mg beginning 09/16/2019 Gleevec 300 mg daily beginning 09/30/2019 (unable to tolerate 400 mg due to diarrhea) CT 12/15/2019-pelvic/peritoneal nodularity-unchanged CT 06/02/2020-mildly worsened soft tissue density between the lower rectum and the sacrum. Mildly worsened superior anorectal adenopathy and perirectal adenopathy.  Office review of CT consistent with stable disease Gleevec 300 mg daily continued Gleevec dose reduced to 200 mg daily beginning 01/07/2021 (diarrhea) Gleevec placed on hold 01/27/2021 due to diarrhea CT abdomen/pelvis 03/04/2021-no irregular annular wall thickening in the remnant rectum contiguous with presacral space, compatible with recurrent tumor.  Small rectovaginal  fistula identified.  Small amount of gas and rectal contrast  in the vagina and vaginal introitus.  Interval progression of metastatic adenopathy in the left perirectal, lower left pelvic sidewall and high presacral chains. Sunitinib 37.5 mg daily beginning 04/04/2021 Remote history of pulmonary embolism-maintained on apixaban Multiple colon polyps noted on the colonoscopy 07/21/2014 with the pathology revealing tubular adenomas Hospitalization 06/21/2015 through 07/06/2015 with nausea/vomiting, high ileostomy output; found to have delayed gastric emptying. Water soluble contrast enema 08/13/2015 positive for a small to moderate volume of water soluble contrast leakage from the low anterior resection and anastomosis site in the pelvis Ileostomy takedown and resection of cystic abdominal wall mass ( benign pathology) on 01/28/2016 Anemia with ferritin in the low normal range 05/23/2016 and 06/20/2016. Ferrous sulfate increased to twice daily 06/26/2016. Improved.      Disposition: Brianna Walker appears unchanged.  She began Sunitinib 04/04/2021.  We reviewed the CBC and chemistry panel from today.  Creatinine and bilirubin are elevated.  Etiology unclear.  She will continue Sunitinib for now, return for chemistry panel and urine protein in 1 week.  She will work on improving oral intake.  Lab appointment next week, follow-up in 2 weeks.  Plan reviewed with Dr. Benay Spice.   Ned Card ANP/GNP-BC   04/21/2021  10:09 AM

## 2021-04-27 ENCOUNTER — Telehealth: Payer: Self-pay | Admitting: *Deleted

## 2021-04-27 ENCOUNTER — Other Ambulatory Visit: Payer: Self-pay

## 2021-04-27 ENCOUNTER — Inpatient Hospital Stay: Payer: Medicare HMO

## 2021-04-27 DIAGNOSIS — N823 Fistula of vagina to large intestine: Secondary | ICD-10-CM | POA: Diagnosis not present

## 2021-04-27 DIAGNOSIS — D649 Anemia, unspecified: Secondary | ICD-10-CM | POA: Diagnosis not present

## 2021-04-27 DIAGNOSIS — R110 Nausea: Secondary | ICD-10-CM | POA: Diagnosis not present

## 2021-04-27 DIAGNOSIS — C49A5 Gastrointestinal stromal tumor of rectum: Secondary | ICD-10-CM | POA: Diagnosis not present

## 2021-04-27 DIAGNOSIS — Z86711 Personal history of pulmonary embolism: Secondary | ICD-10-CM | POA: Diagnosis not present

## 2021-04-27 DIAGNOSIS — C49A4 Gastrointestinal stromal tumor of large intestine: Secondary | ICD-10-CM

## 2021-04-27 DIAGNOSIS — Z79899 Other long term (current) drug therapy: Secondary | ICD-10-CM | POA: Diagnosis not present

## 2021-04-27 LAB — CMP (CANCER CENTER ONLY)
ALT: 13 U/L (ref 0–44)
AST: 19 U/L (ref 15–41)
Albumin: 3.7 g/dL (ref 3.5–5.0)
Alkaline Phosphatase: 99 U/L (ref 38–126)
Anion gap: 10 (ref 5–15)
BUN: 20 mg/dL (ref 8–23)
CO2: 22 mmol/L (ref 22–32)
Calcium: 9 mg/dL (ref 8.9–10.3)
Chloride: 107 mmol/L (ref 98–111)
Creatinine: 1.35 mg/dL — ABNORMAL HIGH (ref 0.44–1.00)
GFR, Estimated: 41 mL/min — ABNORMAL LOW (ref >60)
Glucose, Bld: 96 mg/dL (ref 70–99)
Potassium: 4 mmol/L (ref 3.5–5.1)
Sodium: 139 mmol/L (ref 135–145)
Total Bilirubin: 2.1 mg/dL — ABNORMAL HIGH (ref 0.3–1.2)
Total Protein: 7.1 g/dL (ref 6.5–8.1)

## 2021-04-27 LAB — TOTAL PROTEIN, URINE DIPSTICK: Protein, ur: >300 mg/dL — AB

## 2021-04-27 LAB — MAGNESIUM: Magnesium: 1.4 mg/dL — ABNORMAL LOW (ref 1.7–2.4)

## 2021-04-27 NOTE — Telephone Encounter (Signed)
Labs reviewed by Dr. Benay Spice: Renal functions improved and Mg+ still low. Increase magnesium oxide to tid for now. F/U as scheduled and labs will be checked again. Urine protein is high due to hematuria. Patient notified and agrees to plan. She denies any further diarrhea.

## 2021-04-28 ENCOUNTER — Other Ambulatory Visit (HOSPITAL_COMMUNITY): Payer: Self-pay

## 2021-04-28 ENCOUNTER — Other Ambulatory Visit: Payer: Self-pay | Admitting: Oncology

## 2021-04-28 MED ORDER — SUNITINIB MALATE 37.5 MG PO CAPS
37.5000 mg | ORAL_CAPSULE | Freq: Every day | ORAL | 0 refills | Status: DC
  Filled 2021-04-28: qty 28, 28d supply, fill #0

## 2021-04-29 ENCOUNTER — Other Ambulatory Visit (HOSPITAL_COMMUNITY): Payer: Self-pay

## 2021-05-03 ENCOUNTER — Other Ambulatory Visit (HOSPITAL_COMMUNITY): Payer: Self-pay

## 2021-05-06 ENCOUNTER — Other Ambulatory Visit (HOSPITAL_COMMUNITY): Payer: Self-pay

## 2021-05-06 ENCOUNTER — Encounter: Payer: Self-pay | Admitting: Nurse Practitioner

## 2021-05-06 ENCOUNTER — Inpatient Hospital Stay: Payer: Medicare HMO

## 2021-05-06 ENCOUNTER — Inpatient Hospital Stay: Payer: Medicare HMO | Attending: Oncology | Admitting: Nurse Practitioner

## 2021-05-06 ENCOUNTER — Other Ambulatory Visit: Payer: Self-pay

## 2021-05-06 VITALS — BP 130/80 | HR 100 | Temp 98.1°F | Resp 18 | Ht 64.0 in | Wt 137.6 lb

## 2021-05-06 DIAGNOSIS — R197 Diarrhea, unspecified: Secondary | ICD-10-CM | POA: Diagnosis not present

## 2021-05-06 DIAGNOSIS — C49A5 Gastrointestinal stromal tumor of rectum: Secondary | ICD-10-CM | POA: Diagnosis not present

## 2021-05-06 DIAGNOSIS — D649 Anemia, unspecified: Secondary | ICD-10-CM | POA: Insufficient documentation

## 2021-05-06 DIAGNOSIS — R634 Abnormal weight loss: Secondary | ICD-10-CM | POA: Insufficient documentation

## 2021-05-06 DIAGNOSIS — E860 Dehydration: Secondary | ICD-10-CM | POA: Insufficient documentation

## 2021-05-06 DIAGNOSIS — K625 Hemorrhage of anus and rectum: Secondary | ICD-10-CM | POA: Diagnosis not present

## 2021-05-06 DIAGNOSIS — C49A4 Gastrointestinal stromal tumor of large intestine: Secondary | ICD-10-CM | POA: Diagnosis not present

## 2021-05-06 DIAGNOSIS — N823 Fistula of vagina to large intestine: Secondary | ICD-10-CM | POA: Insufficient documentation

## 2021-05-06 DIAGNOSIS — Z79899 Other long term (current) drug therapy: Secondary | ICD-10-CM | POA: Diagnosis not present

## 2021-05-06 DIAGNOSIS — Z7901 Long term (current) use of anticoagulants: Secondary | ICD-10-CM | POA: Diagnosis not present

## 2021-05-06 DIAGNOSIS — Z86711 Personal history of pulmonary embolism: Secondary | ICD-10-CM | POA: Diagnosis not present

## 2021-05-06 DIAGNOSIS — R112 Nausea with vomiting, unspecified: Secondary | ICD-10-CM | POA: Diagnosis not present

## 2021-05-06 LAB — CBC WITH DIFFERENTIAL (CANCER CENTER ONLY)
Abs Immature Granulocytes: 0 10*3/uL (ref 0.00–0.07)
Basophils Absolute: 0 10*3/uL (ref 0.0–0.1)
Basophils Relative: 0 %
Eosinophils Absolute: 0.1 10*3/uL (ref 0.0–0.5)
Eosinophils Relative: 3 %
HCT: 34.3 % — ABNORMAL LOW (ref 36.0–46.0)
Hemoglobin: 11.2 g/dL — ABNORMAL LOW (ref 12.0–15.0)
Immature Granulocytes: 0 %
Lymphocytes Relative: 48 %
Lymphs Abs: 1.6 10*3/uL (ref 0.7–4.0)
MCH: 29.2 pg (ref 26.0–34.0)
MCHC: 32.7 g/dL (ref 30.0–36.0)
MCV: 89.6 fL (ref 80.0–100.0)
Monocytes Absolute: 0.2 10*3/uL (ref 0.1–1.0)
Monocytes Relative: 5 %
Neutro Abs: 1.5 10*3/uL — ABNORMAL LOW (ref 1.7–7.7)
Neutrophils Relative %: 44 %
Platelet Count: 180 10*3/uL (ref 150–400)
RBC: 3.83 MIL/uL — ABNORMAL LOW (ref 3.87–5.11)
RDW: 15.8 % — ABNORMAL HIGH (ref 11.5–15.5)
WBC Count: 3.5 10*3/uL — ABNORMAL LOW (ref 4.0–10.5)
nRBC: 0 % (ref 0.0–0.2)

## 2021-05-06 LAB — CMP (CANCER CENTER ONLY)
ALT: 14 U/L (ref 0–44)
AST: 19 U/L (ref 15–41)
Albumin: 3.7 g/dL (ref 3.5–5.0)
Alkaline Phosphatase: 103 U/L (ref 38–126)
Anion gap: 7 (ref 5–15)
BUN: 14 mg/dL (ref 8–23)
CO2: 27 mmol/L (ref 22–32)
Calcium: 9.4 mg/dL (ref 8.9–10.3)
Chloride: 105 mmol/L (ref 98–111)
Creatinine: 1.19 mg/dL — ABNORMAL HIGH (ref 0.44–1.00)
GFR, Estimated: 48 mL/min — ABNORMAL LOW (ref >60)
Glucose, Bld: 92 mg/dL (ref 70–99)
Potassium: 4.1 mmol/L (ref 3.5–5.1)
Sodium: 139 mmol/L (ref 135–145)
Total Bilirubin: 1.3 mg/dL — ABNORMAL HIGH (ref 0.3–1.2)
Total Protein: 7.2 g/dL (ref 6.5–8.1)

## 2021-05-06 LAB — MAGNESIUM: Magnesium: 1.9 mg/dL (ref 1.7–2.4)

## 2021-05-06 LAB — PHOSPHORUS: Phosphorus: 3 mg/dL (ref 2.5–4.6)

## 2021-05-06 MED ORDER — SUNITINIB MALATE 25 MG PO CAPS
25.0000 mg | ORAL_CAPSULE | Freq: Every day | ORAL | 0 refills | Status: DC
  Filled 2021-05-06: qty 56, 56d supply, fill #0
  Filled 2021-05-12: qty 28, 28d supply, fill #0

## 2021-05-06 NOTE — Progress Notes (Signed)
Bri[facility] Walker OFFICE PROGRESS NOTE   Diagnosis: Gastrointestinal stromal tumor  INTERVAL HISTORY:   Brianna Walker returns as scheduled.  She continues sunitinib.  She continues to lose weight.  Appetite is poor.  Abdominal pain is better.  She is having diarrhea 3 to 4 days a week.  Occasional rectal bleeding.  No stool from the vagina.  No rash.  No hand or foot pain or redness.  Objective:  Vital signs in last 24 hours:  Blood pressure 130/80, pulse 100, temperature 98.1 F (36.7 C), temperature source Oral, resp. rate 18, height 5\' 4"  (1.626 m), weight 137 lb 9.6 oz (62.4 kg), SpO2 100 %.    HEENT: No thrush or ulcers. Resp: Lungs clear bilaterally. Cardio: Regular rate and rhythm. GI: No hepatosplenomegaly.  Right lower abdomen hernia.  Firm fullness left mid abdomen. Vascular: No leg edema. Skin: Palms without erythema.   Lab Results:  Lab Results  Component Value Date   WBC 3.5 (L) 05/06/2021   HGB 11.2 (L) 05/06/2021   HCT 34.3 (L) 05/06/2021   MCV 89.6 05/06/2021   PLT 180 05/06/2021   NEUTROABS 1.5 (L) 05/06/2021    Imaging:  No results found.  Medications: I have reviewed the patient's current medications.  Assessment/Plan: Gastrointestinal stromal tumor of the rectum Status post an endoscopic ultrasound on 08/06/2014 confirming a mass abutting the distal rectal wall with an FNA biopsy confirming a gastrointestinal stromal tumor Initiation of Gleevec 09/04/2014 MRI pelvis 12/15/2014 with interval decreased size of the large anorectal mass with central necrosis. Continuation of Gleevec Gleevec placed on hold 06/02/2015 in anticipation of surgery 06/04/2015 status post low anterior resection and diverting loop ileostomy, resection of tumor Pathology: 9.5 cm tumor; GISTsubtype spindle; mitotic rate 1/50 high-power field; positive margin of resection Adjuvant Gleevec 08/19/2015 (three-year in total course planned) Ileostomy takedown  01/28/2016 Held 01/28/2016 through 02/14/2016, resumed 02/15/2016 Gleevec stop 04/11/2016-04/18/2016, and 04/26/2016-05/08/2016 Gleevec resumed at a dose of 200 mg daily on 05/09/2016 Gleevec dose increased to 300 mg daily beginning 06/20/2016 Gleevec dose decreased to 200 mg daily beginning 06/23/2016 (poor tolerance at 300 mg dose) CTs 11/22/2017- enhancing soft tissue mass in the presacral space measuring 5.4 x 3.5 cm.  Enlarged pelvic lymph nodes. Gleevec continued at a dose of 200 mg daily Restaging CTs 03/11/2018- interval decrease in size of the presacral soft tissue mass.  Stable small pelvic lymph nodes. Gleevec discontinued 03/13/2018 CT 11/14/2018- new 2.4 cm left pelvic nodule at the piriformis muscle, stable presacral lymph nodes, right abdominal wall hernia, ventral hernia Gleevec 200 mg daily 12/04/2018 CTs 03/31/2019 -decrease in size of soft tissue nodule deep left pelvis.  5 nodules in the sigmoid mesocolon stable to slightly larger.  Stable presacral soft tissue thickening.  No findings for new metastatic disease. CT abdomen/pelvis 09/01/2019-progressive presacral and left perirectal nodularity.  Additional mesenteric nodules along the sigmoid mesocolon and left pelvis unchanged. Gleevec dose escalated to 300 mg daily 09/02/2019 Gleevec dose escalated to 400 mg alternating with 300 mg beginning 09/16/2019 Gleevec 300 mg daily beginning 09/30/2019 (unable to tolerate 400 mg due to diarrhea) CT 12/15/2019-pelvic/peritoneal nodularity-unchanged CT 06/02/2020-mildly worsened soft tissue density between the lower rectum and the sacrum. Mildly worsened superior anorectal adenopathy and perirectal adenopathy.  Office review of CT consistent with stable disease Gleevec 300 mg daily continued Gleevec dose reduced to 200 mg daily beginning 01/07/2021 (diarrhea) Gleevec placed on hold 01/27/2021 due to diarrhea CT abdomen/pelvis 03/04/2021-no irregular annular wall thickening in the remnant rectum  contiguous with presacral space, compatible with recurrent tumor.  Small rectovaginal fistula identified.  Small amount of gas and rectal contrast in the vagina and vaginal introitus.  Interval progression of metastatic adenopathy in the left perirectal, lower left pelvic sidewall and high presacral chains. Sunitinib 37.5 mg daily beginning 04/04/2021 Sunitinib dose reduced to 25 mg daily 05/06/2021 due to diarrhea, mild neutropenia Remote history of pulmonary embolism-maintained on apixaban Multiple colon polyps noted on the colonoscopy 07/21/2014 with the pathology revealing tubular adenomas Hospitalization 06/21/2015 through 07/06/2015 with nausea/vomiting, high ileostomy output; found to have delayed gastric emptying. Water soluble contrast enema 08/13/2015 positive for a small to moderate volume of water soluble contrast leakage from the low anterior resection and anastomosis site in the pelvis Ileostomy takedown and resection of cystic abdominal wall mass ( benign pathology) on 01/28/2016 Anemia with ferritin in the low normal range 05/23/2016 and 06/20/2016. Ferrous sulfate increased to twice daily 06/26/2016. Improved.      Disposition: Brianna Walker has been on sunitinib 37.5 mg daily for approximately 1 month.  Abdominal pain is better.  She has continued anorexia/weight loss.  She is having diarrhea.  The diarrhea may be related to sunitinib.  Imodium is effective.  We reviewed the dosing instructions.  We are decreasing the sunitinib dose to 25 mg daily.  CBC and chemistry panel from today reviewed.  She has mild neutropenia.  Sunitinib dose reduced as above.  Neutropenic precautions reviewed.  Magnesium, creatinine and bilirubin all improved.  She will return for lab and follow-up in approximately 2 weeks.  She will contact the office in the interim with any problems.  We specifically discussed fever, chills, other signs of infection.    Ned Card ANP/GNP-BC   05/06/2021  9:55  AM

## 2021-05-09 ENCOUNTER — Telehealth: Payer: Self-pay

## 2021-05-12 ENCOUNTER — Other Ambulatory Visit (HOSPITAL_COMMUNITY): Payer: Self-pay

## 2021-05-12 ENCOUNTER — Other Ambulatory Visit: Payer: Self-pay | Admitting: Nurse Practitioner

## 2021-05-12 ENCOUNTER — Other Ambulatory Visit: Payer: Self-pay

## 2021-05-12 DIAGNOSIS — C49A4 Gastrointestinal stromal tumor of large intestine: Secondary | ICD-10-CM

## 2021-05-12 MED ORDER — PREDNISONE 10 MG PO TABS: 10.0000 mg | ORAL_TABLET | Freq: Every day | ORAL | 2 refills | Status: DC

## 2021-05-12 NOTE — Telephone Encounter (Signed)
Called and spoke to the patient about her Sutent. I let her know that [facility] refilled her Sutent 25 mg and the pharmacy will be mail it out and she should be receiving  the medication by Monday or Tuesday . Patient voiced understanding

## 2021-05-13 ENCOUNTER — Telehealth: Payer: Self-pay

## 2021-05-13 NOTE — Telephone Encounter (Signed)
Called and spoke with the patient to let her know a new prescription was sent to pharmacy for prednisone 10 mg daily to help with appetite. Patient ask for the prescription to be send to [facility] on [address] [facility]. Patient voiced understanding

## 2021-05-13 NOTE — Telephone Encounter (Signed)
[place] in [place] to transfer the patient prednisone to [facility] in [place]. Pharmacy stating the patient can come in to request to transfer the prescription over. I called and spoke with the patient to informed her to go to [facility] in [place] to request the transfer and pick her prednisone. Patient voiced understanding

## 2021-05-13 NOTE — Telephone Encounter (Signed)
Called and spoke to [place] about transfer the prescription. Patient only need to come in and request for the transfer. I also informed the patient. Patient voiced understanding

## 2021-05-13 NOTE — Telephone Encounter (Signed)
-----   Message from Owens Shark, NP sent at 05/12/2021  4:56 PM EST ----- Please let her know prescription was sent to her pharmacy for prednisone 10 mg daily.  This is to stimulate her appetite.

## 2021-05-13 NOTE — Telephone Encounter (Signed)
-----   Message from Owens Shark, NP sent at 05/13/2021  1:18 PM EST ----- Can ask them to transfer it please ----- Message ----- From: Velna Hatchet, LPN Sent: 0/13/1438  11:55 AM EST To: Owens Shark, NP  Send to the wrong [facility] need to be send at [facility] on [address], [place] [address] ----- Message ----- From: Owens Shark, NP Sent: 05/12/2021   4:57 PM EST To: [facility]  Please let her know prescription was sent to her pharmacy for prednisone 10 mg daily.  This is to stimulate her appetite.

## 2021-05-16 ENCOUNTER — Telehealth: Payer: Self-pay

## 2021-05-16 NOTE — Telephone Encounter (Signed)
Called and spoke with the patient regarding of the voicemail. Patient stating she was not sure where her prescription was sent out. Prescription was sent to [facility]

## 2021-05-16 NOTE — Telephone Encounter (Signed)
[facility]) and spoke with [place] to transfer a prescription to [facility] on [address]. The prescription will be ready this afternoon. I informed the patient her prescription will be ready today. Patient voiced understanding

## 2021-05-17 ENCOUNTER — Other Ambulatory Visit (HOSPITAL_COMMUNITY): Payer: Self-pay

## 2021-05-19 ENCOUNTER — Encounter: Payer: Self-pay | Admitting: Nurse Practitioner

## 2021-05-19 ENCOUNTER — Inpatient Hospital Stay: Payer: Medicare HMO

## 2021-05-19 ENCOUNTER — Other Ambulatory Visit: Payer: Self-pay

## 2021-05-19 ENCOUNTER — Other Ambulatory Visit (HOSPITAL_COMMUNITY): Payer: Self-pay

## 2021-05-19 ENCOUNTER — Telehealth: Payer: Self-pay

## 2021-05-19 ENCOUNTER — Inpatient Hospital Stay (HOSPITAL_BASED_OUTPATIENT_CLINIC_OR_DEPARTMENT_OTHER): Payer: Medicare HMO | Admitting: Nurse Practitioner

## 2021-05-19 VITALS — BP 90/82 | HR 120 | Temp 97.5°F | Resp 18 | Wt 124.7 lb

## 2021-05-19 DIAGNOSIS — Z79899 Other long term (current) drug therapy: Secondary | ICD-10-CM | POA: Diagnosis not present

## 2021-05-19 DIAGNOSIS — Z7901 Long term (current) use of anticoagulants: Secondary | ICD-10-CM | POA: Diagnosis not present

## 2021-05-19 DIAGNOSIS — C49A4 Gastrointestinal stromal tumor of large intestine: Secondary | ICD-10-CM

## 2021-05-19 DIAGNOSIS — R634 Abnormal weight loss: Secondary | ICD-10-CM | POA: Diagnosis not present

## 2021-05-19 DIAGNOSIS — E860 Dehydration: Secondary | ICD-10-CM | POA: Diagnosis not present

## 2021-05-19 DIAGNOSIS — R197 Diarrhea, unspecified: Secondary | ICD-10-CM | POA: Diagnosis not present

## 2021-05-19 DIAGNOSIS — C49A5 Gastrointestinal stromal tumor of rectum: Secondary | ICD-10-CM | POA: Diagnosis not present

## 2021-05-19 DIAGNOSIS — Z86711 Personal history of pulmonary embolism: Secondary | ICD-10-CM | POA: Diagnosis not present

## 2021-05-19 DIAGNOSIS — N823 Fistula of vagina to large intestine: Secondary | ICD-10-CM | POA: Diagnosis not present

## 2021-05-19 DIAGNOSIS — D649 Anemia, unspecified: Secondary | ICD-10-CM | POA: Diagnosis not present

## 2021-05-19 LAB — CMP (CANCER CENTER ONLY)
ALT: 11 U/L (ref 0–44)
AST: 15 U/L (ref 15–41)
Albumin: 3.6 g/dL (ref 3.5–5.0)
Alkaline Phosphatase: 99 U/L (ref 38–126)
Anion gap: 10 (ref 5–15)
BUN: 14 mg/dL (ref 8–23)
CO2: 24 mmol/L (ref 22–32)
Calcium: 9.4 mg/dL (ref 8.9–10.3)
Chloride: 106 mmol/L (ref 98–111)
Creatinine: 1.39 mg/dL — ABNORMAL HIGH (ref 0.44–1.00)
GFR, Estimated: 40 mL/min — ABNORMAL LOW (ref >60)
Glucose, Bld: 113 mg/dL — ABNORMAL HIGH (ref 70–99)
Potassium: 4.1 mmol/L (ref 3.5–5.1)
Sodium: 140 mmol/L (ref 135–145)
Total Bilirubin: 1.5 mg/dL — ABNORMAL HIGH (ref 0.3–1.2)
Total Protein: 7 g/dL (ref 6.5–8.1)

## 2021-05-19 LAB — CBC WITH DIFFERENTIAL (CANCER CENTER ONLY)
Abs Immature Granulocytes: 0.01 10*3/uL (ref 0.00–0.07)
Basophils Absolute: 0 10*3/uL (ref 0.0–0.1)
Basophils Relative: 0 %
Eosinophils Absolute: 0 10*3/uL (ref 0.0–0.5)
Eosinophils Relative: 1 %
HCT: 33.2 % — ABNORMAL LOW (ref 36.0–46.0)
Hemoglobin: 10.7 g/dL — ABNORMAL LOW (ref 12.0–15.0)
Immature Granulocytes: 0 %
Lymphocytes Relative: 38 %
Lymphs Abs: 2.6 10*3/uL (ref 0.7–4.0)
MCH: 29.2 pg (ref 26.0–34.0)
MCHC: 32.2 g/dL (ref 30.0–36.0)
MCV: 90.5 fL (ref 80.0–100.0)
Monocytes Absolute: 0.3 10*3/uL (ref 0.1–1.0)
Monocytes Relative: 5 %
Neutro Abs: 3.7 10*3/uL (ref 1.7–7.7)
Neutrophils Relative %: 56 %
Platelet Count: 241 10*3/uL (ref 150–400)
RBC: 3.67 MIL/uL — ABNORMAL LOW (ref 3.87–5.11)
RDW: 16.9 % — ABNORMAL HIGH (ref 11.5–15.5)
WBC Count: 6.6 10*3/uL (ref 4.0–10.5)
nRBC: 0 % (ref 0.0–0.2)

## 2021-05-19 LAB — C DIFFICILE QUICK SCREEN W PCR REFLEX
C Diff antigen: NEGATIVE
C Diff interpretation: NOT DETECTED
C Diff toxin: NEGATIVE

## 2021-05-19 LAB — MAGNESIUM: Magnesium: 1.7 mg/dL (ref 1.7–2.4)

## 2021-05-19 LAB — PHOSPHORUS: Phosphorus: 3.2 mg/dL (ref 2.5–4.6)

## 2021-05-19 MED ORDER — DIPHENOXYLATE-ATROPINE 2.5-0.025 MG PO TABS: ORAL_TABLET | ORAL | 0 refills | Status: DC

## 2021-05-19 MED ORDER — SODIUM CHLORIDE 0.9 % IV SOLN: INTRAVENOUS | Status: AC

## 2021-05-19 NOTE — Telephone Encounter (Signed)
-----   Message from Owens Shark, NP sent at 05/19/2021  2:43 PM EST ----- Please let her know stool was negative for C. Difficile.  Prescription was sent to her pharmacy for Lomotil to try for the diarrhea.  Have her call us if this is not effective.  Thanks

## 2021-05-19 NOTE — Progress Notes (Signed)
[place] OFFICE PROGRESS NOTE   Diagnosis: Gastrointestinal stromal tumor  INTERVAL HISTORY:   Brianna Walker returns as scheduled.  She continues sunitinib.  Dose was reduced following last office visit.  She began the reduced dose over the weekend.  She has not started prednisone for appetite stimulation.  She continues to have diarrhea.  Imodium is not effective.  Appetite is poor.  She is losing weight.  Persistent diarrhea.  No fever.  Intermittent rectal bleeding.  A few episodes of nausea/vomiting.  Objective:  Vital signs in last 24 hours:  Blood pressure 90/82, pulse (!) 120, temperature (!) 97.5 F (36.4 C), temperature source Tympanic, resp. rate 18, weight 124 lb 11.2 oz (56.6 kg), SpO2 99 %.    HEENT: No thrush or ulcers. Resp: Lungs clear bilaterally. Cardio: Regular, tachycardic. GI: Right lower abdomen hernia.  No hepatomegaly.  Vascular: No leg edema. Neuro: Alert and oriented. Skin: Decreased skin turgor.   Lab Results:  Lab Results  Component Value Date   WBC 6.6 05/19/2021   HGB 10.7 (L) 05/19/2021   HCT 33.2 (L) 05/19/2021   MCV 90.5 05/19/2021   PLT 241 05/19/2021   NEUTROABS 3.7 05/19/2021    Imaging:  No results found.  Medications: I have reviewed the patient's current medications.  Assessment/Plan: Gastrointestinal stromal tumor of the rectum Status post an endoscopic ultrasound on 08/06/2014 confirming a mass abutting the distal rectal wall with an FNA biopsy confirming a gastrointestinal stromal tumor Initiation of Gleevec 09/04/2014 MRI pelvis 12/15/2014 with interval decreased size of the large anorectal mass with central necrosis. Continuation of Gleevec Gleevec placed on hold 06/02/2015 in anticipation of surgery 06/04/2015 status post low anterior resection and diverting loop ileostomy, resection of tumor Pathology: 9.5 cm tumor; GISTsubtype spindle; mitotic rate 1/50 high-power field; positive margin of  resection Adjuvant Gleevec 08/19/2015 (three-year in total course planned) Ileostomy takedown 01/28/2016 Held 01/28/2016 through 02/14/2016, resumed 02/15/2016 Gleevec stop 04/11/2016-04/18/2016, and 04/26/2016-05/08/2016 Gleevec resumed at a dose of 200 mg daily on 05/09/2016 Gleevec dose increased to 300 mg daily beginning 06/20/2016 Gleevec dose decreased to 200 mg daily beginning 06/23/2016 (poor tolerance at 300 mg dose) CTs 11/22/2017- enhancing soft tissue mass in the presacral space measuring 5.4 x 3.5 cm.  Enlarged pelvic lymph nodes. Gleevec continued at a dose of 200 mg daily Restaging CTs 03/11/2018- interval decrease in size of the presacral soft tissue mass.  Stable small pelvic lymph nodes. Gleevec discontinued 03/13/2018 CT 11/14/2018- new 2.4 cm left pelvic nodule at the piriformis muscle, stable presacral lymph nodes, right abdominal wall hernia, ventral hernia Gleevec 200 mg daily 12/04/2018 CTs 03/31/2019 -decrease in size of soft tissue nodule deep left pelvis.  5 nodules in the sigmoid mesocolon stable to slightly larger.  Stable presacral soft tissue thickening.  No findings for new metastatic disease. CT abdomen/pelvis 09/01/2019-progressive presacral and left perirectal nodularity.  Additional mesenteric nodules along the sigmoid mesocolon and left pelvis unchanged. Gleevec dose escalated to 300 mg daily 09/02/2019 Gleevec dose escalated to 400 mg alternating with 300 mg beginning 09/16/2019 Gleevec 300 mg daily beginning 09/30/2019 (unable to tolerate 400 mg due to diarrhea) CT 12/15/2019-pelvic/peritoneal nodularity-unchanged CT 06/02/2020-mildly worsened soft tissue density between the lower rectum and the sacrum. Mildly worsened superior anorectal adenopathy and perirectal adenopathy.  Office review of CT consistent with stable disease Gleevec 300 mg daily continued Gleevec dose reduced to 200 mg daily beginning 01/07/2021 (diarrhea) Gleevec placed on hold 01/27/2021 due to  diarrhea CT abdomen/pelvis 03/04/2021-no  irregular annular wall thickening in the remnant rectum contiguous with presacral space, compatible with recurrent tumor.  Small rectovaginal fistula identified.  Small amount of gas and rectal contrast in the vagina and vaginal introitus.  Interval progression of metastatic adenopathy in the left perirectal, lower left pelvic sidewall and high presacral chains. Sunitinib 37.5 mg daily beginning 04/04/2021 Sunitinib dose reduced to 25 mg daily 05/06/2021 due to diarrhea, mild neutropenia Sutent placed on hold 05/19/2021 Remote history of pulmonary embolism-maintained on apixaban Multiple colon polyps noted on the colonoscopy 07/21/2014 with the pathology revealing tubular adenomas Hospitalization 06/21/2015 through 07/06/2015 with nausea/vomiting, high ileostomy output; found to have delayed gastric emptying. Water soluble contrast enema 08/13/2015 positive for a small to moderate volume of water soluble contrast leakage from the low anterior resection and anastomosis site in the pelvis Ileostomy takedown and resection of cystic abdominal wall mass ( benign pathology) on 01/28/2016 Anemia with ferritin in the low normal range 05/23/2016 and 06/20/2016. Ferrous sulfate increased to twice daily 06/26/2016. Improved.    Disposition: Brianna Walker is currently on sunitinib.  Performance status is poor.  She is losing weight.  She is having significant diarrhea.  Etiology of symptoms is unclear, question related to sunitinib versus disease progression.  She appears dehydrated.  We are placing sunitinib on hold.  She will submit a stool sample today for C. difficile testing.  Prescription sent to her pharmacy for Lomotil.  She will receive IV fluids today.  She will return for lab and follow-up in 1 week.  We are available to see her sooner if needed.  Patient seen with Dr. Benay Spice.    Ned Card ANP/GNP-BC   05/19/2021  8:59 AM  This was a shared visit with Ned Card.  Brianna Walker has failure to thrive with anorexia/weight loss, diarrhea and nausea.  It is unclear whether her symptoms are related to sunitinib, progression of the gastrointestinal stromal tumor, or another etiology.  We will check a stool sample for C. difficile.  She will receive intravenous fluids today.  Sunitinib will be placed on hold.  She will begin a trial of prednisone.  I was present for greater than 50% of today's visit.  I performed medical decision making.  Julieanne Manson, MD

## 2021-05-19 NOTE — Telephone Encounter (Signed)
Called and spoke with the patient to let her know stool was negative for C Difficile. Prescription was sent to [facility] for Lomotil to stop the diarrhea. If the diarrhea continue call us back. Patient voiced understanding

## 2021-05-19 NOTE — Patient Instructions (Signed)

## 2021-05-20 ENCOUNTER — Encounter: Payer: Medicare HMO | Admitting: Dietician

## 2021-05-20 ENCOUNTER — Telehealth: Payer: Self-pay | Admitting: Dietician

## 2021-05-20 NOTE — Telephone Encounter (Signed)
Nutrition Assessment   Reason for Assessment: MST (+wt loss)   ASSESSMENT: 76 year old female with GIST. She is receiving oral chemotherapy with sunitinib. Treatment held on 1/19 secondary to persistent diarrhea. Patient followed by Dr. Lianne Moris with patient via telephone. Introduced self and services available at [facility]. Patient appreciative of call and agreeable to telephone visit. Patient reports diarrhea significantly improved with steroids. She is feeling better s/p IV hydration on 1/19. Patient had bowl of oatmeal for breakfast and part of a chicken pot pie this afternoon. Patient has been drinking 2-3 Boost daily which she tolerates well. Patient denies nausea, vomiting. She reports bitter taste of foods.    Nutrition Focused Physical Exam: Unable to complete   Medications: lomotil, prednisone, prilosec, zofran   Labs: 1/19 - glucose 113, Cr 1.39, Mg/K/Phos (WNL)   Anthropometrics: Weights have decreased 9.5% in 2 weeks. Patient weighed 137 lb 9.6 oz on 1/06. Weights have decreased 33% (61 lbs) in the last 11 months; significant  Height: 5'4" Weight: 124 lb 11.2 oz  UBW: 185 lb (06/10/20) BMI: 21.40   NUTRITION DIAGNOSIS: Unintentional weight loss related to cancer and associated treatment side effects as evidenced by reported poor appetite, persistent diarrhea, 9.5% wt loss in 2 weeks, 33% wt decrease in 11 months which is significant for time frame. Highly suspect severe malnutrition given weight trends.    INTERVENTION:  Educated on importance of adequate calories and protein to maintain weights/strength Encouraged small frequent meals and snacks throughout the day - will mail high calorie, high protein snack ideas Continue drinking Boost Plus/equivalent 3x/day - will mail coupons Discussed strategies for diarrhea - will mail handout Discussed strategies for altered taste - will mail handout with tips Suggested pt try baking soda salt water rinses several times/day  - recipe provided Contact information given    MONITORING, EVALUATION, GOAL: weight trends, intake    Next Visit: via telephone ~4 weeks

## 2021-05-26 ENCOUNTER — Other Ambulatory Visit: Payer: Self-pay

## 2021-05-26 ENCOUNTER — Inpatient Hospital Stay (HOSPITAL_BASED_OUTPATIENT_CLINIC_OR_DEPARTMENT_OTHER): Payer: Medicare HMO | Admitting: Nurse Practitioner

## 2021-05-26 ENCOUNTER — Encounter: Payer: Self-pay | Admitting: Nurse Practitioner

## 2021-05-26 ENCOUNTER — Inpatient Hospital Stay: Payer: Medicare HMO

## 2021-05-26 VITALS — BP 130/90 | HR 100 | Temp 97.8°F | Resp 18 | Ht 64.0 in | Wt 137.0 lb

## 2021-05-26 DIAGNOSIS — E860 Dehydration: Secondary | ICD-10-CM | POA: Diagnosis not present

## 2021-05-26 DIAGNOSIS — Z79899 Other long term (current) drug therapy: Secondary | ICD-10-CM | POA: Diagnosis not present

## 2021-05-26 DIAGNOSIS — C49A5 Gastrointestinal stromal tumor of rectum: Secondary | ICD-10-CM | POA: Diagnosis not present

## 2021-05-26 DIAGNOSIS — C49A4 Gastrointestinal stromal tumor of large intestine: Secondary | ICD-10-CM | POA: Diagnosis not present

## 2021-05-26 DIAGNOSIS — D649 Anemia, unspecified: Secondary | ICD-10-CM | POA: Diagnosis not present

## 2021-05-26 DIAGNOSIS — R634 Abnormal weight loss: Secondary | ICD-10-CM | POA: Diagnosis not present

## 2021-05-26 DIAGNOSIS — Z7901 Long term (current) use of anticoagulants: Secondary | ICD-10-CM | POA: Diagnosis not present

## 2021-05-26 DIAGNOSIS — R197 Diarrhea, unspecified: Secondary | ICD-10-CM | POA: Diagnosis not present

## 2021-05-26 DIAGNOSIS — Z86711 Personal history of pulmonary embolism: Secondary | ICD-10-CM | POA: Diagnosis not present

## 2021-05-26 DIAGNOSIS — N823 Fistula of vagina to large intestine: Secondary | ICD-10-CM | POA: Diagnosis not present

## 2021-05-26 LAB — CBC WITH DIFFERENTIAL (CANCER CENTER ONLY)
Abs Immature Granulocytes: 0.03 10*3/uL (ref 0.00–0.07)
Basophils Absolute: 0 10*3/uL (ref 0.0–0.1)
Basophils Relative: 0 %
Eosinophils Absolute: 0 10*3/uL (ref 0.0–0.5)
Eosinophils Relative: 0 %
HCT: 31.9 % — ABNORMAL LOW (ref 36.0–46.0)
Hemoglobin: 10.2 g/dL — ABNORMAL LOW (ref 12.0–15.0)
Immature Granulocytes: 0 %
Lymphocytes Relative: 34 %
Lymphs Abs: 2.4 10*3/uL (ref 0.7–4.0)
MCH: 29.5 pg (ref 26.0–34.0)
MCHC: 32 g/dL (ref 30.0–36.0)
MCV: 92.2 fL (ref 80.0–100.0)
Monocytes Absolute: 0.4 10*3/uL (ref 0.1–1.0)
Monocytes Relative: 5 %
Neutro Abs: 4.2 10*3/uL (ref 1.7–7.7)
Neutrophils Relative %: 61 %
Platelet Count: 298 10*3/uL (ref 150–400)
RBC: 3.46 MIL/uL — ABNORMAL LOW (ref 3.87–5.11)
RDW: 18.5 % — ABNORMAL HIGH (ref 11.5–15.5)
WBC Count: 7 10*3/uL (ref 4.0–10.5)
nRBC: 0 % (ref 0.0–0.2)

## 2021-05-26 LAB — CMP (CANCER CENTER ONLY)
ALT: 19 U/L (ref 0–44)
AST: 21 U/L (ref 15–41)
Albumin: 3.6 g/dL (ref 3.5–5.0)
Alkaline Phosphatase: 90 U/L (ref 38–126)
Anion gap: 9 (ref 5–15)
BUN: 17 mg/dL (ref 8–23)
CO2: 26 mmol/L (ref 22–32)
Calcium: 9.3 mg/dL (ref 8.9–10.3)
Chloride: 107 mmol/L (ref 98–111)
Creatinine: 1.05 mg/dL — ABNORMAL HIGH (ref 0.44–1.00)
GFR, Estimated: 56 mL/min — ABNORMAL LOW (ref >60)
Glucose, Bld: 99 mg/dL (ref 70–99)
Potassium: 3.5 mmol/L (ref 3.5–5.1)
Sodium: 142 mmol/L (ref 135–145)
Total Bilirubin: 0.7 mg/dL (ref 0.3–1.2)
Total Protein: 6.8 g/dL (ref 6.5–8.1)

## 2021-05-26 NOTE — Progress Notes (Signed)
[place] OFFICE PROGRESS NOTE   Diagnosis: Gastrointestinal stromal tumor  INTERVAL HISTORY:   Ms. Bridgewater returns as scheduled.  Sunitinib was placed on hold on 05/19/2021 due to diarrhea, weight loss.  She received IV fluids.  Stool returned negative for C. difficile.  She was started on prednisone.  She is feeling much better.  Stools now solid.  Estimates 4-5 bowel movement today.  Appetite is better.  She has gained 13 pounds.  Objective:  Vital signs in last 24 hours:  Blood pressure 130/90, pulse 100, temperature 97.8 F (36.6 C), temperature source Oral, resp. rate 18, height 5\' 4"  (1.626 m), weight 137 lb (62.1 kg), SpO2 100 %.    HEENT: Mucous membranes are moist. Resp: Lungs clear bilaterally. Cardio: Regular rate and rhythm. GI: Abdomen soft and nontender.  Right lower abdomen hernia.  No hepatomegaly. Vascular: No leg edema. Neuro: Alert and oriented.   Lab Results:  Lab Results  Component Value Date   WBC 7.0 05/26/2021   HGB 10.2 (L) 05/26/2021   HCT 31.9 (L) 05/26/2021   MCV 92.2 05/26/2021   PLT 298 05/26/2021   NEUTROABS 4.2 05/26/2021    Imaging:  No results found.  Medications: I have reviewed the patient's current medications.  Assessment/Plan: Gastrointestinal stromal tumor of the rectum Status post an endoscopic ultrasound on 08/06/2014 confirming a mass abutting the distal rectal wall with an FNA biopsy confirming a gastrointestinal stromal tumor Initiation of Gleevec 09/04/2014 MRI pelvis 12/15/2014 with interval decreased size of the large anorectal mass with central necrosis. Continuation of Gleevec Gleevec placed on hold 06/02/2015 in anticipation of surgery 06/04/2015 status post low anterior resection and diverting loop ileostomy, resection of tumor Pathology: 9.5 cm tumor; GISTsubtype spindle; mitotic rate 1/50 high-power field; positive margin of resection Adjuvant Gleevec 08/19/2015 (three-year in total course  planned) Ileostomy takedown 01/28/2016 Held 01/28/2016 through 02/14/2016, resumed 02/15/2016 Gleevec stop 04/11/2016-04/18/2016, and 04/26/2016-05/08/2016 Gleevec resumed at a dose of 200 mg daily on 05/09/2016 Gleevec dose increased to 300 mg daily beginning 06/20/2016 Gleevec dose decreased to 200 mg daily beginning 06/23/2016 (poor tolerance at 300 mg dose) CTs 11/22/2017- enhancing soft tissue mass in the presacral space measuring 5.4 x 3.5 cm.  Enlarged pelvic lymph nodes. Gleevec continued at a dose of 200 mg daily Restaging CTs 03/11/2018- interval decrease in size of the presacral soft tissue mass.  Stable small pelvic lymph nodes. Gleevec discontinued 03/13/2018 CT 11/14/2018- new 2.4 cm left pelvic nodule at the piriformis muscle, stable presacral lymph nodes, right abdominal wall hernia, ventral hernia Gleevec 200 mg daily 12/04/2018 CTs 03/31/2019 -decrease in size of soft tissue nodule deep left pelvis.  5 nodules in the sigmoid mesocolon stable to slightly larger.  Stable presacral soft tissue thickening.  No findings for new metastatic disease. CT abdomen/pelvis 09/01/2019-progressive presacral and left perirectal nodularity.  Additional mesenteric nodules along the sigmoid mesocolon and left pelvis unchanged. Gleevec dose escalated to 300 mg daily 09/02/2019 Gleevec dose escalated to 400 mg alternating with 300 mg beginning 09/16/2019 Gleevec 300 mg daily beginning 09/30/2019 (unable to tolerate 400 mg due to diarrhea) CT 12/15/2019-pelvic/peritoneal nodularity-unchanged CT 06/02/2020-mildly worsened soft tissue density between the lower rectum and the sacrum. Mildly worsened superior anorectal adenopathy and perirectal adenopathy.  Office review of CT consistent with stable disease Gleevec 300 mg daily continued Gleevec dose reduced to 200 mg daily beginning 01/07/2021 (diarrhea) Gleevec placed on hold 01/27/2021 due to diarrhea CT abdomen/pelvis 03/04/2021-no irregular annular wall thickening  in the  remnant rectum contiguous with presacral space, compatible with recurrent tumor.  Small rectovaginal fistula identified.  Small amount of gas and rectal contrast in the vagina and vaginal introitus.  Interval progression of metastatic adenopathy in the left perirectal, lower left pelvic sidewall and high presacral chains. Sunitinib 37.5 mg daily beginning 04/04/2021 Sunitinib dose reduced to 25 mg daily 05/06/2021 due to diarrhea, mild neutropenia (she began the reduced dose on 05/15/2021) Sutent placed on hold 05/19/2021 due to diarrhea, weight loss, failure to thrive Sunitinib resumed 25 mg daily 05/26/2021 Remote history of pulmonary embolism-maintained on apixaban Multiple colon polyps noted on the colonoscopy 07/21/2014 with the pathology revealing tubular adenomas Hospitalization 06/21/2015 through 07/06/2015 with nausea/vomiting, high ileostomy output; found to have delayed gastric emptying. Water soluble contrast enema 08/13/2015 positive for a small to moderate volume of water soluble contrast leakage from the low anterior resection and anastomosis site in the pelvis Ileostomy takedown and resection of cystic abdominal wall mass ( benign pathology) on 01/28/2016 Anemia with ferritin in the low normal range 05/23/2016 and 06/20/2016. Ferrous sulfate increased to twice daily 06/26/2016. Improved.    Disposition: Ms. Strege appears improved since sunitinib was placed on hold.  She is no longer having diarrhea.  Appetite is better and she is gaining weight.  She will resume sunitinib at the reduced dose of 25 mg daily.  She understands to stop sunitinib and contact the office with recurrent diarrhea.  She will continue prednisone.  We will see her back in 2 weeks, sooner if needed.    Ned Card ANP/GNP-BC   05/26/2021  9:47 AM

## 2021-05-30 ENCOUNTER — Telehealth: Payer: Self-pay

## 2021-05-30 NOTE — Telephone Encounter (Signed)
TC from Pt stating since starting back on sutent last Thursday she has been having small amounts of diarrhea and everyday that she takes the medication the looser the stool. Pt states she is taking the prednisone. Discussed with Leander Rams NP informed to tell Pt to stop taking medication push fluids and if it should get worse to give [place] a return call. Pt verbalized understanding. Next appointment 06/09/21.

## 2021-05-31 ENCOUNTER — Other Ambulatory Visit: Payer: Self-pay | Admitting: Oncology

## 2021-05-31 DIAGNOSIS — C49A4 Gastrointestinal stromal tumor of large intestine: Secondary | ICD-10-CM

## 2021-06-01 ENCOUNTER — Other Ambulatory Visit (HOSPITAL_COMMUNITY): Payer: Self-pay

## 2021-06-09 ENCOUNTER — Inpatient Hospital Stay (HOSPITAL_BASED_OUTPATIENT_CLINIC_OR_DEPARTMENT_OTHER): Payer: Medicare HMO | Admitting: Oncology

## 2021-06-09 ENCOUNTER — Other Ambulatory Visit: Payer: Self-pay

## 2021-06-09 ENCOUNTER — Inpatient Hospital Stay: Payer: Medicare HMO | Attending: Oncology

## 2021-06-09 ENCOUNTER — Other Ambulatory Visit (HOSPITAL_COMMUNITY): Payer: Self-pay

## 2021-06-09 VITALS — BP 135/80 | HR 99 | Temp 98.1°F | Resp 18 | Ht 64.0 in | Wt 136.0 lb

## 2021-06-09 DIAGNOSIS — C49A4 Gastrointestinal stromal tumor of large intestine: Secondary | ICD-10-CM

## 2021-06-09 DIAGNOSIS — C49A5 Gastrointestinal stromal tumor of rectum: Secondary | ICD-10-CM | POA: Insufficient documentation

## 2021-06-09 DIAGNOSIS — Z7901 Long term (current) use of anticoagulants: Secondary | ICD-10-CM | POA: Insufficient documentation

## 2021-06-09 DIAGNOSIS — D649 Anemia, unspecified: Secondary | ICD-10-CM | POA: Diagnosis not present

## 2021-06-09 DIAGNOSIS — Z86711 Personal history of pulmonary embolism: Secondary | ICD-10-CM | POA: Diagnosis not present

## 2021-06-09 DIAGNOSIS — Z79899 Other long term (current) drug therapy: Secondary | ICD-10-CM | POA: Insufficient documentation

## 2021-06-09 LAB — CBC WITH DIFFERENTIAL (CANCER CENTER ONLY)
Abs Immature Granulocytes: 0.01 10*3/uL (ref 0.00–0.07)
Basophils Absolute: 0 10*3/uL (ref 0.0–0.1)
Basophils Relative: 0 %
Eosinophils Absolute: 0 10*3/uL (ref 0.0–0.5)
Eosinophils Relative: 1 %
HCT: 31.9 % — ABNORMAL LOW (ref 36.0–46.0)
Hemoglobin: 10.3 g/dL — ABNORMAL LOW (ref 12.0–15.0)
Immature Granulocytes: 0 %
Lymphocytes Relative: 35 %
Lymphs Abs: 1.7 10*3/uL (ref 0.7–4.0)
MCH: 29.9 pg (ref 26.0–34.0)
MCHC: 32.3 g/dL (ref 30.0–36.0)
MCV: 92.7 fL (ref 80.0–100.0)
Monocytes Absolute: 0.3 10*3/uL (ref 0.1–1.0)
Monocytes Relative: 7 %
Neutro Abs: 2.8 10*3/uL (ref 1.7–7.7)
Neutrophils Relative %: 57 %
Platelet Count: 270 10*3/uL (ref 150–400)
RBC: 3.44 MIL/uL — ABNORMAL LOW (ref 3.87–5.11)
RDW: 18.1 % — ABNORMAL HIGH (ref 11.5–15.5)
WBC Count: 4.9 10*3/uL (ref 4.0–10.5)
nRBC: 0 % (ref 0.0–0.2)

## 2021-06-09 LAB — CMP (CANCER CENTER ONLY)
ALT: 8 U/L (ref 0–44)
AST: 12 U/L — ABNORMAL LOW (ref 15–41)
Albumin: 3.6 g/dL (ref 3.5–5.0)
Alkaline Phosphatase: 96 U/L (ref 38–126)
Anion gap: 8 (ref 5–15)
BUN: 18 mg/dL (ref 8–23)
CO2: 24 mmol/L (ref 22–32)
Calcium: 9.3 mg/dL (ref 8.9–10.3)
Chloride: 107 mmol/L (ref 98–111)
Creatinine: 1.19 mg/dL — ABNORMAL HIGH (ref 0.44–1.00)
GFR, Estimated: 48 mL/min — ABNORMAL LOW (ref >60)
Glucose, Bld: 89 mg/dL (ref 70–99)
Potassium: 3.9 mmol/L (ref 3.5–5.1)
Sodium: 139 mmol/L (ref 135–145)
Total Bilirubin: 0.6 mg/dL (ref 0.3–1.2)
Total Protein: 6.9 g/dL (ref 6.5–8.1)

## 2021-06-09 MED ORDER — HYDROCODONE-ACETAMINOPHEN 5-325 MG PO TABS: 1.0000 | ORAL_TABLET | Freq: Four times a day (QID) | ORAL | 0 refills | Status: DC | PRN

## 2021-06-09 NOTE — Progress Notes (Signed)
[place] OFFICE PROGRESS NOTE   Diagnosis: Gastrointestinal stromal tumor  INTERVAL HISTORY:   Brianna Walker returns as scheduled.  She continued to have diarrhea while on Sutent at the 25 mg dose.  She discontinued Sutent 05/30/2021.  She no longer has diarrhea.  She has discomfort at the "rectal ".  Tramadol does not help.  Her appetite has improved on prednisone.  Objective:  Vital signs in last 24 hours:  Blood pressure 135/80, pulse 99, temperature 98.1 F (36.7 C), temperature source Oral, resp. rate 18, height 5\' 4"  (1.626 m), weight 136 lb (61.7 kg), SpO2 100 %.    HEENT: No thrush or ulcers Resp: Lungs clear bilaterally Cardio: Regular rate and rhythm GI: No hepatosplenomegaly, right low abdomen hernia, no mass Vascular: No leg edema   Lab Results:  Lab Results  Component Value Date   WBC 4.9 06/09/2021   HGB 10.3 (L) 06/09/2021   HCT 31.9 (L) 06/09/2021   MCV 92.7 06/09/2021   PLT 270 06/09/2021   NEUTROABS 2.8 06/09/2021    CMP  Lab Results  Component Value Date   NA 142 05/26/2021   K 3.5 05/26/2021   CL 107 05/26/2021   CO2 26 05/26/2021   GLUCOSE 99 05/26/2021   BUN 17 05/26/2021   CREATININE 1.05 (H) 05/26/2021   CALCIUM 9.3 05/26/2021   PROT 6.8 05/26/2021   ALBUMIN 3.6 05/26/2021   AST 21 05/26/2021   ALT 19 05/26/2021   ALKPHOS 90 05/26/2021   BILITOT 0.7 05/26/2021   GFRNONAA 56 (L) 05/26/2021   GFRAA >60 12/05/2019    Medications: I have reviewed the patient's current medications.   Assessment/Plan: Gastrointestinal stromal tumor of the rectum Status post an endoscopic ultrasound on 08/06/2014 confirming a mass abutting the distal rectal wall with an FNA biopsy confirming a gastrointestinal stromal tumor Initiation of Gleevec 09/04/2014 MRI pelvis 12/15/2014 with interval decreased size of the large anorectal mass with central necrosis. Continuation of Gleevec Gleevec placed on hold 06/02/2015 in anticipation of  surgery 06/04/2015 status post low anterior resection and diverting loop ileostomy, resection of tumor Pathology: 9.5 cm tumor; GISTsubtype spindle; mitotic rate 1/50 high-power field; positive margin of resection Adjuvant Gleevec 08/19/2015 (three-year in total course planned) Ileostomy takedown 01/28/2016 Held 01/28/2016 through 02/14/2016, resumed 02/15/2016 Gleevec stop 04/11/2016-04/18/2016, and 04/26/2016-05/08/2016 Gleevec resumed at a dose of 200 mg daily on 05/09/2016 Gleevec dose increased to 300 mg daily beginning 06/20/2016 Gleevec dose decreased to 200 mg daily beginning 06/23/2016 (poor tolerance at 300 mg dose) CTs 11/22/2017- enhancing soft tissue mass in the presacral space measuring 5.4 x 3.5 cm.  Enlarged pelvic lymph nodes. Gleevec continued at a dose of 200 mg daily Restaging CTs 03/11/2018- interval decrease in size of the presacral soft tissue mass.  Stable small pelvic lymph nodes. Gleevec discontinued 03/13/2018 CT 11/14/2018- new 2.4 cm left pelvic nodule at the piriformis muscle, stable presacral lymph nodes, right abdominal wall hernia, ventral hernia Gleevec 200 mg daily 12/04/2018 CTs 03/31/2019 -decrease in size of soft tissue nodule deep left pelvis.  5 nodules in the sigmoid mesocolon stable to slightly larger.  Stable presacral soft tissue thickening.  No findings for new metastatic disease. CT abdomen/pelvis 09/01/2019-progressive presacral and left perirectal nodularity.  Additional mesenteric nodules along the sigmoid mesocolon and left pelvis unchanged. Gleevec dose escalated to 300 mg daily 09/02/2019 Gleevec dose escalated to 400 mg alternating with 300 mg beginning 09/16/2019 Gleevec 300 mg daily beginning 09/30/2019 (unable to tolerate 400 mg due  to diarrhea) CT 12/15/2019-pelvic/peritoneal nodularity-unchanged CT 06/02/2020-mildly worsened soft tissue density between the lower rectum and the sacrum. Mildly worsened superior anorectal adenopathy and perirectal  adenopathy.  Office review of CT consistent with stable disease Gleevec 300 mg daily continued Gleevec dose reduced to 200 mg daily beginning 01/07/2021 (diarrhea) Gleevec placed on hold 01/27/2021 due to diarrhea CT abdomen/pelvis 03/04/2021-no irregular annular wall thickening in the remnant rectum contiguous with presacral space, compatible with recurrent tumor.  Small rectovaginal fistula identified.  Small amount of gas and rectal contrast in the vagina and vaginal introitus.  Interval progression of metastatic adenopathy in the left perirectal, lower left pelvic sidewall and high presacral chains. Sunitinib 37.5 mg daily beginning 04/04/2021 Sunitinib dose reduced to 25 mg daily 05/06/2021 due to diarrhea, mild neutropenia (she began the reduced dose on 05/15/2021) Sutent placed on hold 05/19/2021 due to diarrhea, weight loss, failure to thrive Sunitinib resumed 25 mg daily 05/26/2021 Sunitinib discontinued 05/30/2021 secondary to persistent diarrhea Remote history of pulmonary embolism-maintained on apixaban Multiple colon polyps noted on the colonoscopy 07/21/2014 with the pathology revealing tubular adenomas Hospitalization 06/21/2015 through 07/06/2015 with nausea/vomiting, high ileostomy output; found to have delayed gastric emptying. Water soluble contrast enema 08/13/2015 positive for a small to moderate volume of water soluble contrast leakage from the low anterior resection and anastomosis site in the pelvis Ileostomy takedown and resection of cystic abdominal wall mass ( benign pathology) on 01/28/2016 Anemia with ferritin in the low normal range 05/23/2016 and 06/20/2016. Ferrous sulfate increased to twice daily 06/26/2016. Improved.      Disposition: Brianna Walker discontinued sunitinib 05/30/2021 secondary to persistent diarrhea.  Sunitinib will remain on hold.  She will undergo restaging CT evaluation and return for an office visit next week.  We will consider additional salvage systemic  treatment options based on the CT findings.  She has pain at the rectum likely secondary to tumor in the pelvis.  She will begin a trial of hydrocodone for pain.  Betsy Coder, MD  06/09/2021  8:47 AM

## 2021-06-15 ENCOUNTER — Other Ambulatory Visit: Payer: Self-pay

## 2021-06-15 ENCOUNTER — Ambulatory Visit (HOSPITAL_BASED_OUTPATIENT_CLINIC_OR_DEPARTMENT_OTHER)
Admission: RE | Admit: 2021-06-15 | Discharge: 2021-06-15 | Disposition: A | Discharge status: Home / Self Care | Payer: Medicare HMO | Source: Ambulatory Visit | Attending: Oncology | Admitting: Oncology

## 2021-06-15 ENCOUNTER — Encounter (HOSPITAL_BASED_OUTPATIENT_CLINIC_OR_DEPARTMENT_OTHER): Payer: Self-pay

## 2021-06-15 DIAGNOSIS — K439 Ventral hernia without obstruction or gangrene: Secondary | ICD-10-CM | POA: Diagnosis not present

## 2021-06-15 DIAGNOSIS — N281 Cyst of kidney, acquired: Secondary | ICD-10-CM | POA: Diagnosis not present

## 2021-06-15 DIAGNOSIS — C49A5 Gastrointestinal stromal tumor of rectum: Secondary | ICD-10-CM | POA: Diagnosis not present

## 2021-06-15 DIAGNOSIS — C49A4 Gastrointestinal stromal tumor of large intestine: Secondary | ICD-10-CM | POA: Diagnosis not present

## 2021-06-15 DIAGNOSIS — K449 Diaphragmatic hernia without obstruction or gangrene: Secondary | ICD-10-CM | POA: Diagnosis not present

## 2021-06-16 ENCOUNTER — Inpatient Hospital Stay (HOSPITAL_BASED_OUTPATIENT_CLINIC_OR_DEPARTMENT_OTHER): Payer: Medicare HMO | Admitting: Nurse Practitioner

## 2021-06-16 ENCOUNTER — Encounter: Payer: Self-pay | Admitting: Nurse Practitioner

## 2021-06-16 VITALS — BP 135/80 | HR 100 | Temp 98.1°F | Resp 19 | Ht 64.0 in | Wt 135.0 lb

## 2021-06-16 DIAGNOSIS — C49A4 Gastrointestinal stromal tumor of large intestine: Secondary | ICD-10-CM

## 2021-06-16 DIAGNOSIS — D649 Anemia, unspecified: Secondary | ICD-10-CM | POA: Diagnosis not present

## 2021-06-16 DIAGNOSIS — Z7901 Long term (current) use of anticoagulants: Secondary | ICD-10-CM | POA: Diagnosis not present

## 2021-06-16 DIAGNOSIS — Z79899 Other long term (current) drug therapy: Secondary | ICD-10-CM | POA: Diagnosis not present

## 2021-06-16 DIAGNOSIS — C49A5 Gastrointestinal stromal tumor of rectum: Secondary | ICD-10-CM | POA: Diagnosis not present

## 2021-06-16 DIAGNOSIS — Z86711 Personal history of pulmonary embolism: Secondary | ICD-10-CM | POA: Diagnosis not present

## 2021-06-16 MED ORDER — REGORAFENIB 40 MG PO TABS
80.0000 mg | ORAL_TABLET | Freq: Every day | ORAL | 0 refills | Status: DC
  Filled 2021-06-16: qty 42, 21d supply, fill #0
  Filled 2021-06-17: qty 42, 28d supply, fill #0

## 2021-06-16 MED ORDER — OXYCODONE-ACETAMINOPHEN 5-325 MG PO TABS: 1.0000 | ORAL_TABLET | Freq: Four times a day (QID) | ORAL | 0 refills | Status: DC | PRN

## 2021-06-16 NOTE — Progress Notes (Signed)
Provided patient reading sheet on regorafenib and process to obtain medication.

## 2021-06-16 NOTE — Progress Notes (Addendum)
[place] OFFICE PROGRESS NOTE   Diagnosis: Gastrointestinal stromal tumor  INTERVAL HISTORY:   Brianna Walker returns as scheduled.  She is no longer having diarrhea.  No nausea or vomiting.  No stool from the vagina.  Appetite is better.  She notes increased pain at the low back/rectum.  She would like a stronger pain medication.  Objective:  Vital signs in last 24 hours:  Blood pressure 135/80, pulse 100, temperature 98.1 F (36.7 C), temperature source Oral, resp. rate 19, height _0  (1.626 m), weight 135 lb (61.2 kg), SpO2 100 %.    HEENT: No thrush or ulcers. Resp: Lungs clear bilaterally. Cardio: Regular rate and rhythm. GI: Abdomen soft and nontender.  No hepatomegaly.  Right lower abdomen hernia. Vascular: No leg edema.   Lab Results:  Lab Results  Component Value Date   WBC 4.9 06/09/2021   HGB 10.3 (L) 06/09/2021   HCT 31.9 (L) 06/09/2021   MCV 92.7 06/09/2021   PLT 270 06/09/2021   NEUTROABS 2.8 06/09/2021    Imaging:  CT ABDOMEN PELVIS WO CONTRAST  Result Date: 06/16/2021 CLINICAL DATA:  Restaging anorectal GIST tumor. Status post low anterior resection. EXAM: CT ABDOMEN AND PELVIS WITHOUT CONTRAST TECHNIQUE: Multidetector CT imaging of the abdomen and pelvis was performed following the standard protocol without IV contrast. RADIATION DOSE REDUCTION: This exam was performed according to the departmental dose-optimization program which includes automated exposure control, adjustment of the mA and/or kV according to patient size and/or use of iterative reconstruction technique. COMPARISON:  03/04/2021 FINDINGS: Lower chest: No acute abnormality. Scarring identified within the lung bases. Hepatobiliary: No focal liver abnormality is seen. Status post cholecystectomy. No biliary dilatation. Pancreas: Unremarkable. No pancreatic ductal dilatation or surrounding inflammatory changes. Spleen: Normal in size without focal abnormality. Adrenals/Urinary  Tract: Normal adrenal glands. Small kidney cysts are again noted. No nephrolithiasis or hydronephrosis identified. Bladder unremarkable. Stomach/Bowel: Tiny hiatal hernia. Stomach otherwise unremarkable. No pathologic dilatation of the large or small bowel loops. Large right lower quadrant ventral abdominal wall hernia contains multiple nonobstructed loops of small bowel. Enteric contrast material is identified at the level of the ileocecal valve. Similar appearance of annular wall thickening with luminal narrowing in the remnant rectum which is contiguous with presacral space, image 66/3 through image 71/3. Previously noted small rectovaginal fistula is again identified with gas noted within the lumen of the vagina, image 78/3. Vascular/Lymphatic: No aneurysm.  Aortic atherosclerosis. -index left perirectal lymph node measures 3.4 x 2.3 cm, image 68/3. Previously 3.0 x 2.3 cm. -lateral left pelvic sidewall soft tissue nodule measures 1.7 x 1.1 cm, image 70/3. Formally 1.7 x 1.1 cm. -high presacral lobulated solid mass measures 5.3 x 3.2 cm, image 55/3. Previously 5.3 x 3.5 cm. Reproductive: Hysterectomy.  No adnexal mass. Other: No significant free fluid or fluid collections. No pneumoperitoneum. Musculoskeletal: Right total hip arthroplasty. Lumbar spondylosis. No aggressive lytic or sclerotic bone lesions. Benign appearing bone island noted within the left iliac bone. IMPRESSION: 1. Similar appearance of annular wall thickening with luminal narrowing in the remnant rectum which is contiguous with presacral space. 2. Previously noted small rectovaginal fistula is again identified with gas noted within the lumen of the vagina. 3. Mild increase in size of left perirectal and left pelvic sidewall soft tissue nodules compatible with metastatic disease. The large lobulated mass within the presacral soft tissue region is not significantly changed in the interval. 4. Large right lower quadrant ventral abdominal wall  hernia contains multiple  nonobstructed loops of small bowel. 5. Aortic Atherosclerosis (ICD10-I70.0). Electronically Signed   By: Kerby Moors M.D.   On: 06/16/2021 08:46    Medications: I have reviewed the patient's current medications.  Assessment/Plan: Gastrointestinal stromal tumor of the rectum Status post an endoscopic ultrasound on 08/06/2014 confirming a mass abutting the distal rectal wall with an FNA biopsy confirming a gastrointestinal stromal tumor Initiation of Gleevec 09/04/2014 MRI pelvis 12/15/2014 with interval decreased size of the large anorectal mass with central necrosis. Continuation of Gleevec Gleevec placed on hold 06/02/2015 in anticipation of surgery 06/04/2015 status post low anterior resection and diverting loop ileostomy, resection of tumor Pathology: 9.5 cm tumor; GISTsubtype spindle; mitotic rate 1/50 high-power field; positive margin of resection; c-Kit mutation detected (Exon 13: [id number] ([id number])) Adjuvant Gleevec 08/19/2015 (three-year in total course planned) Ileostomy takedown 01/28/2016 Held 01/28/2016 through 02/14/2016, resumed 02/15/2016 Gleevec stop 04/11/2016-04/18/2016, and 04/26/2016-05/08/2016 Gleevec resumed at a dose of 200 mg daily on 05/09/2016 Gleevec dose increased to 300 mg daily beginning 06/20/2016 Gleevec dose decreased to 200 mg daily beginning 06/23/2016 (poor tolerance at 300 mg dose) CTs 11/22/2017- enhancing soft tissue mass in the presacral space measuring 5.4 x 3.5 cm.  Enlarged pelvic lymph nodes. Gleevec continued at a dose of 200 mg daily Restaging CTs 03/11/2018- interval decrease in size of the presacral soft tissue mass.  Stable small pelvic lymph nodes. Gleevec discontinued 03/13/2018 CT 11/14/2018- new 2.4 cm left pelvic nodule at the piriformis muscle, stable presacral lymph nodes, right abdominal wall hernia, ventral hernia Gleevec 200 mg daily 12/04/2018 CTs 03/31/2019 -decrease in size of soft tissue nodule deep left  pelvis.  5 nodules in the sigmoid mesocolon stable to slightly larger.  Stable presacral soft tissue thickening.  No findings for new metastatic disease. CT abdomen/pelvis 09/01/2019-progressive presacral and left perirectal nodularity.  Additional mesenteric nodules along the sigmoid mesocolon and left pelvis unchanged. Gleevec dose escalated to 300 mg daily 09/02/2019 Gleevec dose escalated to 400 mg alternating with 300 mg beginning 09/16/2019 Gleevec 300 mg daily beginning 09/30/2019 (unable to tolerate 400 mg due to diarrhea) CT 12/15/2019-pelvic/peritoneal nodularity-unchanged CT 06/02/2020-mildly worsened soft tissue density between the lower rectum and the sacrum. Mildly worsened superior anorectal adenopathy and perirectal adenopathy.  Office review of CT consistent with stable disease Gleevec 300 mg daily continued Gleevec dose reduced to 200 mg daily beginning 01/07/2021 (diarrhea) Gleevec placed on hold 01/27/2021 due to diarrhea CT abdomen/pelvis 03/04/2021-no irregular annular wall thickening in the remnant rectum contiguous with presacral space, compatible with recurrent tumor.  Small rectovaginal fistula identified.  Small amount of gas and rectal contrast in the vagina and vaginal introitus.  Interval progression of metastatic adenopathy in the left perirectal, lower left pelvic sidewall and high presacral chains. Sunitinib 37.5 mg daily beginning 04/04/2021 Sunitinib dose reduced to 25 mg daily 05/06/2021 due to diarrhea, mild neutropenia (she began the reduced dose on 05/15/2021) Sutent placed on hold 05/19/2021 due to diarrhea, weight loss, failure to thrive Sunitinib resumed 25 mg daily 05/26/2021 Sunitinib discontinued 05/30/2021 secondary to persistent diarrhea CTs 06/15/2021-similar appearance of annular wall thickening with luminal narrowing in the remnant rectum.  Previously noted small rectovaginal fistula again identified with gas noted within the lumen of the vagina.  Mild increase in the size  of left perirectal left pelvic sidewall soft tissue nodules.  Large lobulated mass within the presacral soft tissue region not significantly changed in the interval. Remote history of pulmonary embolism-maintained on apixaban Multiple colon polyps noted on the colonoscopy 07/21/2014  with the pathology revealing tubular adenomas Hospitalization 06/21/2015 through 07/06/2015 with nausea/vomiting, high ileostomy output; found to have delayed gastric emptying. Water soluble contrast enema 08/13/2015 positive for a small to moderate volume of water soluble contrast leakage from the low anterior resection and anastomosis site in the pelvis Ileostomy takedown and resection of cystic abdominal wall mass ( benign pathology) on 01/28/2016 Anemia with ferritin in the low normal range 05/23/2016 and 06/20/2016. Ferrous sulfate increased to twice daily 06/26/2016. Improved.      Disposition: [name] appears improved.  The diarrhea has improved.  Her appetite is better.  CT scans from yesterday show overall stable disease.  She is unable to tolerate sunitinib due to diarrhea.  Dr. Benay Spice recommends a trial of regorafenib.  We reviewed potential side effects including bone marrow toxicity, rash, diarrhea, liver toxicity, mouth sores, hypertension.  She agrees to proceed.  She is having increased pain at the rectum.  New prescription sent to her pharmacy for Percocet.  She will contact the office if this is not effective.  She will return for follow-up in approximately 2 weeks.  We are available to see her sooner if needed.  Patient seen with Dr. Benay Spice.  CT images reviewed on the computer with Ms. Sadlowski.    Ned Card ANP/GNP-BC   06/16/2021  9:06 AM This was a shared visit with Ned Card.  We reviewed the CT findings and images with Ms. Newburn.  There appears to be minimal disease progression.  She is unable to tolerate imatinib or sunitinib.  I suspect she will develop progression soon if  she is followed off of systemic therapy.  I recommend a trial of regorafenib.  We reviewed potential toxicities associated with regorafenib.  She agrees to proceed.  She will begin regorafenib with a dose reduction.  I was present for greater than 50% of today's visit.  I performed medical decision making.   Julieanne Manson, MD

## 2021-06-17 ENCOUNTER — Telehealth: Payer: Self-pay | Admitting: Pharmacist

## 2021-06-17 ENCOUNTER — Telehealth: Payer: Self-pay

## 2021-06-17 ENCOUNTER — Other Ambulatory Visit (HOSPITAL_COMMUNITY): Payer: Self-pay

## 2021-06-17 NOTE — Telephone Encounter (Signed)
Oral Oncology Pharmacist Encounter   Prior Authorization for Brianna Walker has been approved.     Effective dates: 06/17/21 through 12/14/21  Copay $0   Oral Oncology Clinic will continue to follow.   Darl Pikes, PharmD, BCPS. BCOP Hematology/Oncology Clinical Pharmacist [facility] [phone]  06/17/2021 3:00 PM

## 2021-06-17 NOTE — Telephone Encounter (Signed)
Oral Chemotherapy Pharmacist Encounter  Patient will start medication on 06/22/21.  I spoke with patient for overview of: Stivarga (regorafenib) for the treatment of gastrointestinal stromal tumor (GIST) of the rectum, planned duration until disease progression or unacceptable drug toxicity.  Counseled patient on administration, dosing, side effects, monitoring, drug-food interactions, safe handling, storage, and disposal.  Patient will take Stivarga 40mg  tablets, 2 tablets (80mg ) by mouth once daily with a glass of water and a low fat meal of <600 calories, and <30% fat content. Patient will take for 21 days on, followed by 7 days off. Repeat every 28 days.  Patient knows to avoid grapefruit and grapefruit juice while on therapy with Stivarga.  Stivarga start date: 06/22/21  Adverse effects include but are not limited to: hand-foot syndrome, diarrhea, nausea, abdominal pain, musculoskeletal pain, fatigue, hypertension, delayed wound healing, decreased blood counts, and hepatotoxicity.    Patient has anti-emetic on hand and knows to take it if nausea develops.   Patient will obtain anti diarrheal and alert the office of 4 or more loose stools above baseline.  Reviewed with patient importance of keeping a medication schedule and plan for any missed doses. No barriers to medication adherence identified.  Stivarga should be discontinued 2 weeks prior to any planned surgery and resumed postsurgery based on clinical judgement of wound healing.  Medication reconciliation performed and medication/allergy list updated.  Insurance authorization for Marylyn Ishihara has been obtained. Test claim at the pharmacy revealed copayment $0 for 1st fill of regorafenib. This will ship from the [facility] outpatient pharmacy to deliver to patient's home on 06/21/21.  Patient informed the pharmacy will reach out 5-7 days prior to needing next fill of Stivarga to coordinate continued medication acquisition to prevent  break in therapy.  All questions answered.  Ms. Swango voiced understanding and appreciation.   Medication education handout placed in mail for patient. Patient knows to call the office with questions or concerns. Oral Chemotherapy Clinic phone number provided to patient.   Benn Moulder, PharmD Pharmacy Resident  06/17/2021 4:19 PM

## 2021-06-17 NOTE — Telephone Encounter (Signed)
Oral Oncology Pharmacist Encounter   Received notification from [facility] that prior authorization for [place] is required.   PA submitted on [facility] [id number] Status is pending   Oral Oncology Clinic will continue to follow.   Darl Pikes, PharmD, BCPS, [facility] Hematology/Oncology Clinical Pharmacist [facility] [place] [facility] [phone]  06/17/2021 1:34 PM

## 2021-06-17 NOTE — Telephone Encounter (Signed)
Oral Oncology Pharmacist Encounter  Received new prescription for regorafenib (Stivarga) for the treatment of gastrointestinal stromal tumor (GIST) of the rectum, planned duration until disease progression or unacceptable drug toxicity.  Labs from 06/09/21 assessed, no relevant lab abnormalities. Prescription dose and frequency assessed.   Current medication list in Epic reviewed, 1 DDIs with regorafenib and atorvastatin identified: monitor for increased side effects of atorvastatin (muscle aches, elevated liver enzymes)  Evaluated chart and no patient barriers to medication adherence identified.   Prescription has been e-scribed to the [facility] for benefits analysis and approval.  Oral Oncology Clinic will continue to follow for insurance authorization, copayment issues, initial counseling and start date.  Patient agreed to treatment on 06/16/21 per MD documentation.  Benn Moulder, PharmD Pharmacy Resident  06/17/2021 3:41 PM

## 2021-06-21 ENCOUNTER — Other Ambulatory Visit (HOSPITAL_COMMUNITY): Payer: Self-pay

## 2021-06-22 ENCOUNTER — Telehealth: Payer: Self-pay

## 2021-06-22 ENCOUNTER — Other Ambulatory Visit: Payer: Self-pay | Admitting: Nurse Practitioner

## 2021-06-22 ENCOUNTER — Ambulatory Visit: Payer: Medicare HMO | Admitting: Dietician

## 2021-06-22 DIAGNOSIS — C49A4 Gastrointestinal stromal tumor of large intestine: Secondary | ICD-10-CM

## 2021-06-22 MED ORDER — OXYCODONE-ACETAMINOPHEN 5-325 MG PO TABS: 1.0000 | ORAL_TABLET | Freq: Four times a day (QID) | ORAL | 0 refills | Status: DC | PRN

## 2021-06-22 NOTE — Telephone Encounter (Signed)
Patient called request a refill on her Percocet. The request was place  on the provider desk

## 2021-06-22 NOTE — Progress Notes (Signed)
Nutrition Follow-up:  Patient currently receiving oral chemotherapy with Stivarga for GIST. Sunitinib discontinued due to persistent diarrhea.   Spoke with patient via telephone. She reports appetite has "tremendously improved" and is eating much better. Patient reports diarrhea as well as altered taste has resolved. She is hoping that she will not have similar side effects with Stivarga. Patient reports taking first dose this morning. Patient continues to drink Boost daily. She reports coupons and handouts previously mailed to her have not been received. Will mail additional copies and coupons out today.    Medications: percocet, mag-ox, stivarga  Labs: 2/9 labs reviewed   Anthropometrics: Last weight 135 lb on 2/16 stable   NUTRITION DIAGNOSIS:  Unintentional weight loss improved    INTERVENTION:  Continue drinking 1-2 Boost Plus/equivalent for weight maintenance  Patient did not receive mailed handouts and coupons - will resend these today, address confirmed    MONITORING, EVALUATION, GOAL: weight trends, intake    NEXT VISIT: No follow-up scheduled at this time. Patient encouraged to contact with nutrition questions/concerns

## 2021-06-23 ENCOUNTER — Telehealth: Payer: Self-pay

## 2021-06-23 NOTE — Telephone Encounter (Signed)
Patient called and to let us know she had diarrhea yesterday but she going to keep an eye on it. Advice the patient to take imodium. Patient voiced understanding

## 2021-06-24 ENCOUNTER — Other Ambulatory Visit: Payer: Self-pay | Admitting: Internal Medicine

## 2021-06-24 ENCOUNTER — Other Ambulatory Visit: Payer: Self-pay | Admitting: Oncology

## 2021-06-28 ENCOUNTER — Encounter (HOSPITAL_COMMUNITY): Payer: Self-pay | Admitting: Emergency Medicine

## 2021-06-28 NOTE — Progress Notes (Signed)
PA approved on nystatin 100000 units/gm powder through [employer] from 05/01/21-04/30/22.

## 2021-07-01 ENCOUNTER — Inpatient Hospital Stay: Payer: Medicare HMO

## 2021-07-01 ENCOUNTER — Inpatient Hospital Stay: Payer: Medicare HMO | Admitting: Oncology

## 2021-07-04 ENCOUNTER — Other Ambulatory Visit: Payer: Self-pay | Admitting: Nurse Practitioner

## 2021-07-04 ENCOUNTER — Other Ambulatory Visit (HOSPITAL_COMMUNITY): Payer: Self-pay

## 2021-07-04 ENCOUNTER — Other Ambulatory Visit: Payer: Self-pay | Admitting: Oncology

## 2021-07-04 ENCOUNTER — Telehealth: Payer: Self-pay

## 2021-07-04 DIAGNOSIS — C49A4 Gastrointestinal stromal tumor of large intestine: Secondary | ICD-10-CM

## 2021-07-04 NOTE — Telephone Encounter (Signed)
Patient called and request a refill on Percocet.Place the request on the provider desk.  ?

## 2021-07-05 ENCOUNTER — Inpatient Hospital Stay: Payer: Medicare HMO

## 2021-07-05 ENCOUNTER — Other Ambulatory Visit: Payer: Self-pay | Admitting: Nurse Practitioner

## 2021-07-05 ENCOUNTER — Inpatient Hospital Stay: Payer: Medicare HMO | Admitting: Oncology

## 2021-07-05 ENCOUNTER — Ambulatory Visit: Payer: Medicare HMO | Admitting: Internal Medicine

## 2021-07-05 DIAGNOSIS — C49A4 Gastrointestinal stromal tumor of large intestine: Secondary | ICD-10-CM

## 2021-07-05 MED ORDER — OXYCODONE-ACETAMINOPHEN 5-325 MG PO TABS: 1.0000 | ORAL_TABLET | Freq: Four times a day (QID) | ORAL | 0 refills | Status: DC | PRN

## 2021-07-06 ENCOUNTER — Other Ambulatory Visit: Payer: Self-pay | Admitting: Nurse Practitioner

## 2021-07-06 DIAGNOSIS — C49A4 Gastrointestinal stromal tumor of large intestine: Secondary | ICD-10-CM

## 2021-07-07 ENCOUNTER — Other Ambulatory Visit: Payer: Self-pay | Admitting: Nurse Practitioner

## 2021-07-07 ENCOUNTER — Other Ambulatory Visit (HOSPITAL_COMMUNITY): Payer: Self-pay

## 2021-07-07 DIAGNOSIS — C49A4 Gastrointestinal stromal tumor of large intestine: Secondary | ICD-10-CM

## 2021-07-07 MED ORDER — DIPHENOXYLATE-ATROPINE 2.5-0.025 MG PO TABS
ORAL_TABLET | ORAL | 0 refills | Status: DC
  Filled 2021-07-07: qty 60, 7d supply, fill #0

## 2021-07-08 ENCOUNTER — Other Ambulatory Visit: Payer: Self-pay

## 2021-07-08 ENCOUNTER — Inpatient Hospital Stay: Payer: Medicare HMO | Attending: Oncology

## 2021-07-08 ENCOUNTER — Other Ambulatory Visit (HOSPITAL_COMMUNITY): Payer: Self-pay

## 2021-07-08 ENCOUNTER — Inpatient Hospital Stay (HOSPITAL_BASED_OUTPATIENT_CLINIC_OR_DEPARTMENT_OTHER): Payer: Medicare HMO | Admitting: Oncology

## 2021-07-08 VITALS — BP 144/80 | HR 96 | Temp 98.1°F | Resp 20 | Ht 64.0 in | Wt 129.6 lb

## 2021-07-08 DIAGNOSIS — D649 Anemia, unspecified: Secondary | ICD-10-CM | POA: Insufficient documentation

## 2021-07-08 DIAGNOSIS — C49A5 Gastrointestinal stromal tumor of rectum: Secondary | ICD-10-CM | POA: Diagnosis not present

## 2021-07-08 DIAGNOSIS — Z7901 Long term (current) use of anticoagulants: Secondary | ICD-10-CM | POA: Diagnosis not present

## 2021-07-08 DIAGNOSIS — Z86711 Personal history of pulmonary embolism: Secondary | ICD-10-CM | POA: Insufficient documentation

## 2021-07-08 DIAGNOSIS — C49A4 Gastrointestinal stromal tumor of large intestine: Secondary | ICD-10-CM

## 2021-07-08 DIAGNOSIS — Z79891 Long term (current) use of opiate analgesic: Secondary | ICD-10-CM | POA: Diagnosis not present

## 2021-07-08 DIAGNOSIS — G893 Neoplasm related pain (acute) (chronic): Secondary | ICD-10-CM | POA: Insufficient documentation

## 2021-07-08 LAB — CMP (CANCER CENTER ONLY)
ALT: 6 U/L (ref 0–44)
AST: 12 U/L — ABNORMAL LOW (ref 15–41)
Albumin: 3.8 g/dL (ref 3.5–5.0)
Alkaline Phosphatase: 113 U/L (ref 38–126)
Anion gap: 10 (ref 5–15)
BUN: 21 mg/dL (ref 8–23)
CO2: 24 mmol/L (ref 22–32)
Calcium: 9.6 mg/dL (ref 8.9–10.3)
Chloride: 104 mmol/L (ref 98–111)
Creatinine: 1.42 mg/dL — ABNORMAL HIGH (ref 0.44–1.00)
GFR, Estimated: 39 mL/min — ABNORMAL LOW (ref >60)
Glucose, Bld: 90 mg/dL (ref 70–99)
Potassium: 4.1 mmol/L (ref 3.5–5.1)
Sodium: 138 mmol/L (ref 135–145)
Total Bilirubin: 0.9 mg/dL (ref 0.3–1.2)
Total Protein: 7.6 g/dL (ref 6.5–8.1)

## 2021-07-08 LAB — CBC WITH DIFFERENTIAL (CANCER CENTER ONLY)
Abs Immature Granulocytes: 0.03 10*3/uL (ref 0.00–0.07)
Basophils Absolute: 0 10*3/uL (ref 0.0–0.1)
Basophils Relative: 1 %
Eosinophils Absolute: 0.1 10*3/uL (ref 0.0–0.5)
Eosinophils Relative: 1 %
HCT: 35.8 % — ABNORMAL LOW (ref 36.0–46.0)
Hemoglobin: 11.6 g/dL — ABNORMAL LOW (ref 12.0–15.0)
Immature Granulocytes: 0 %
Lymphocytes Relative: 32 %
Lymphs Abs: 2.4 10*3/uL (ref 0.7–4.0)
MCH: 29.4 pg (ref 26.0–34.0)
MCHC: 32.4 g/dL (ref 30.0–36.0)
MCV: 90.6 fL (ref 80.0–100.0)
Monocytes Absolute: 0.5 10*3/uL (ref 0.1–1.0)
Monocytes Relative: 7 %
Neutro Abs: 4.4 10*3/uL (ref 1.7–7.7)
Neutrophils Relative %: 59 %
Platelet Count: 431 10*3/uL — ABNORMAL HIGH (ref 150–400)
RBC: 3.95 MIL/uL (ref 3.87–5.11)
RDW: 16.1 % — ABNORMAL HIGH (ref 11.5–15.5)
WBC Count: 7.5 10*3/uL (ref 4.0–10.5)
nRBC: 0 % (ref 0.0–0.2)

## 2021-07-08 NOTE — Progress Notes (Signed)
?[place] ?OFFICE PROGRESS NOTE ? ? ?Diagnosis: Gastrointestinal stromal tumor ? ?INTERVAL HISTORY:  ? ?Brianna Walker began regorafenib on 06/22/2021.  No rash.  She developed diarrhea and discontinued regorafenib on 07/01/2021.  The diarrhea has improved.  She continues to have rectal pain.  She takes oxycodone as needed for pain. ? ?Objective: ? ?Vital signs in last 24 hours: ? ?Blood pressure (!) 144/80, pulse 96, temperature 98.1 ?F (36.7 ?C), temperature source Oral, resp. rate 20, height '5\' 4"'  (1.626 m), weight 129 lb 9.6 oz (58.8 kg), SpO2 98 %. ?  ? ?HEENT: No thrush or ulcers ?Resp: Lungs clear bilaterally ?Cardio: Regular rate and rhythm ?GI: No hepatosplenomegaly, no mass, right abdominal hernia ?Vascular: No leg edema  ?Skin: Palms without erythema ? ?Lab Results: ? ?Lab Results  ?Component Value Date  ? WBC 7.5 07/08/2021  ? HGB 11.6 (L) 07/08/2021  ? HCT 35.8 (L) 07/08/2021  ? MCV 90.6 07/08/2021  ? PLT 431 (H) 07/08/2021  ? NEUTROABS 4.4 07/08/2021  ? ? ?CMP  ?Lab Results  ?Component Value Date  ? NA 139 06/09/2021  ? K 3.9 06/09/2021  ? CL 107 06/09/2021  ? CO2 24 06/09/2021  ? GLUCOSE 89 06/09/2021  ? BUN 18 06/09/2021  ? CREATININE 1.19 (H) 06/09/2021  ? CALCIUM 9.3 06/09/2021  ? PROT 6.9 06/09/2021  ? ALBUMIN 3.6 06/09/2021  ? AST 12 (L) 06/09/2021  ? ALT 8 06/09/2021  ? ALKPHOS 96 06/09/2021  ? BILITOT 0.6 06/09/2021  ? GFRNONAA 48 (L) 06/09/2021  ? GFRAA >60 12/05/2019  ? ? ?No results found for: CEA1, CEA, [id number], CA125 ? ?Lab Results  ?Component Value Date  ? INR 0.94 01/26/2016  ? LABPROT 12.6 01/26/2016  ? ? ?Imaging: ? ?No results found. ? ?Medications: I have reviewed the patient's current medications. ? ? ?Assessment/Plan: ?Gastrointestinal stromal tumor of the rectum ?Status post an endoscopic ultrasound on 08/06/2014 confirming a mass abutting the distal rectal wall with an FNA biopsy confirming a gastrointestinal stromal tumor ?Initiation of Gleevec 09/04/2014 ?MRI pelvis  12/15/2014 with interval decreased size of the large anorectal mass with central necrosis. ?Continuation of Gleevec ?[place] placed on hold 06/02/2015 in anticipation of surgery ?06/04/2015 status post low anterior resection and diverting loop ileostomy, resection of tumor ?Pathology: 9.5 cm tumor; GISTsubtype spindle; mitotic rate 1/50 high-power field; positive margin of resection; c-Kit mutation detected (Exon 13: c.[id number]>G (p.[id number])) ?Adjuvant Gleevec 08/19/2015 (three-year in total course planned) ?Ileostomy takedown 01/28/2016 ?Held 01/28/2016 through 02/14/2016, resumed 02/15/2016 ?Gleevec stop 04/11/2016-04/18/2016, and 04/26/2016-05/08/2016 ?Gleevec resumed at a dose of 200 mg daily on 05/09/2016 ?Gleevec dose increased to 300 mg daily beginning 06/20/2016 ?Gleevec dose decreased to 200 mg daily beginning 06/23/2016 (poor tolerance at 300 mg dose) ?CTs 11/22/2017- enhancing soft tissue mass in the presacral space measuring 5.4 x 3.5 cm.  Enlarged pelvic lymph nodes. ?Gleevec continued at a dose of 200 mg daily ?Restaging CTs 03/11/2018- interval decrease in size of the presacral soft tissue mass.  Stable small pelvic lymph nodes. ?Gleevec discontinued 03/13/2018 ?CT 11/14/2018- new 2.4 cm left pelvic nodule at the piriformis muscle, stable presacral lymph nodes, right abdominal wall hernia, ventral hernia ?Gleevec 200 mg daily 12/04/2018 ?CTs 03/31/2019 -decrease in size of soft tissue nodule deep left pelvis.  5 nodules in the sigmoid mesocolon stable to slightly larger.  Stable presacral soft tissue thickening.  No findings for new metastatic disease. ?CT abdomen/pelvis 09/01/2019-progressive presacral and left perirectal nodularity.  Additional mesenteric nodules along the sigmoid  mesocolon and left pelvis unchanged. ?Gleevec dose escalated to 300 mg daily 09/02/2019 ?Gleevec dose escalated to 400 mg alternating with 300 mg beginning 09/16/2019 ?Gleevec 300 mg daily beginning 09/30/2019 (unable to tolerate 400 mg  due to diarrhea) ?CT 12/15/2019-pelvic/peritoneal nodularity-unchanged ?CT 06/02/2020-mildly worsened soft tissue density between the lower rectum and the sacrum. Mildly worsened superior anorectal adenopathy and perirectal adenopathy.  Office review of CT consistent with stable disease ?Gleevec 300 mg daily continued ?Gleevec dose reduced to 200 mg daily beginning 01/07/2021 (diarrhea) ?[place] placed on hold 01/27/2021 due to diarrhea ?CT abdomen/pelvis 03/04/2021-no irregular annular wall thickening in the remnant rectum contiguous with presacral space, compatible with recurrent tumor.  Small rectovaginal fistula identified.  Small amount of gas and rectal contrast in the vagina and vaginal introitus.  Interval progression of metastatic adenopathy in the left perirectal, lower left pelvic sidewall and high presacral chains. ?Sunitinib 37.5 mg daily beginning 04/04/2021 ?Sunitinib dose reduced to 25 mg daily 05/06/2021 due to diarrhea, mild neutropenia (she began the reduced dose on 05/15/2021) ?Sutent placed on hold 05/19/2021 due to diarrhea, weight loss, failure to thrive ?Sunitinib resumed 25 mg daily 05/26/2021 ?Sunitinib discontinued 05/30/2021 secondary to persistent diarrhea ?CTs 06/15/2021-similar appearance of annular wall thickening with luminal narrowing in the remnant rectum.  Previously noted small rectovaginal fistula again identified with gas noted within the lumen of the vagina.  Mild increase in the size of left perirectal left pelvic sidewall soft tissue nodules.  Large lobulated mass within the presacral soft tissue region not significantly changed in the interval. ?Regorafenib 06/22/2021-discontinued 07/01/2021 secondary to diarrhea ?Regorafenib resumed at a dose of 40 mg daily 07/08/2021 ?Remote history of pulmonary embolism-maintained on apixaban ?Multiple colon polyps noted on the colonoscopy 07/21/2014 with the pathology revealing tubular adenomas ?Hospitalization 06/21/2015 through 07/06/2015 with  nausea/vomiting, high ileostomy output; found to have delayed gastric emptying. ?Water soluble contrast enema 08/13/2015 positive for a small to moderate volume of water soluble contrast leakage from the low anterior resection and anastomosis site in the pelvis ?Ileostomy takedown and resection of cystic abdominal wall mass ( benign pathology) on 01/28/2016 ?Anemia with ferritin in the low normal range 05/23/2016 and 06/20/2016. Ferrous sulfate increased to twice daily 06/26/2016. Improved. ?  ?  ? ? ?Disposition: ?Brianna Walker began regorafenib 06/22/2021.  She developed diarrhea.  The diarrhea improved when she discontinued regorafenib 07/01/2021.  She will resume regorafenib with a dose reduction to 40 mg daily.  She will return for an office visit in 2 weeks.  She will continue Imodium and Lomotil for diarrhea.  She will call for increased diarrhea. ? ?Betsy Coder, MD ? ?07/08/2021  ?10:29 AM ? ? ? ?

## 2021-07-11 ENCOUNTER — Other Ambulatory Visit (HOSPITAL_COMMUNITY): Payer: Self-pay

## 2021-07-13 ENCOUNTER — Other Ambulatory Visit (HOSPITAL_COMMUNITY): Payer: Self-pay

## 2021-07-15 ENCOUNTER — Telehealth: Payer: Self-pay

## 2021-07-15 ENCOUNTER — Other Ambulatory Visit: Payer: Self-pay | Admitting: Nurse Practitioner

## 2021-07-15 DIAGNOSIS — C49A4 Gastrointestinal stromal tumor of large intestine: Secondary | ICD-10-CM

## 2021-07-15 MED ORDER — OXYCODONE-ACETAMINOPHEN 5-325 MG PO TABS: 1.0000 | ORAL_TABLET | ORAL | 0 refills | Status: DC | PRN

## 2021-07-15 NOTE — Telephone Encounter (Signed)
Called and spoke with the patient about she wants to know if she can get a stronger dose  of her Percocet. Patient stated she is taking 2 pills every 5 hours and the pain is coming back fast. Discuss this with the provider and she will send in a new script to the pharmacy. Patient gave verbal understanding and denied further questions ?

## 2021-07-21 ENCOUNTER — Inpatient Hospital Stay: Payer: Medicare HMO

## 2021-07-21 ENCOUNTER — Encounter: Payer: Self-pay | Admitting: Nurse Practitioner

## 2021-07-21 ENCOUNTER — Inpatient Hospital Stay (HOSPITAL_BASED_OUTPATIENT_CLINIC_OR_DEPARTMENT_OTHER): Payer: Medicare HMO | Admitting: Nurse Practitioner

## 2021-07-21 ENCOUNTER — Other Ambulatory Visit: Payer: Self-pay

## 2021-07-21 VITALS — BP 130/80 | HR 80 | Temp 97.4°F | Resp 18 | Wt 127.8 lb

## 2021-07-21 DIAGNOSIS — C49A4 Gastrointestinal stromal tumor of large intestine: Secondary | ICD-10-CM

## 2021-07-21 DIAGNOSIS — Z86711 Personal history of pulmonary embolism: Secondary | ICD-10-CM | POA: Diagnosis not present

## 2021-07-21 DIAGNOSIS — Z79891 Long term (current) use of opiate analgesic: Secondary | ICD-10-CM | POA: Diagnosis not present

## 2021-07-21 DIAGNOSIS — Z7901 Long term (current) use of anticoagulants: Secondary | ICD-10-CM | POA: Diagnosis not present

## 2021-07-21 DIAGNOSIS — D649 Anemia, unspecified: Secondary | ICD-10-CM | POA: Diagnosis not present

## 2021-07-21 DIAGNOSIS — C49A5 Gastrointestinal stromal tumor of rectum: Secondary | ICD-10-CM | POA: Diagnosis not present

## 2021-07-21 DIAGNOSIS — G893 Neoplasm related pain (acute) (chronic): Secondary | ICD-10-CM | POA: Diagnosis not present

## 2021-07-21 LAB — CMP (CANCER CENTER ONLY)
ALT: 7 U/L (ref 0–44)
AST: 10 U/L — ABNORMAL LOW (ref 15–41)
Albumin: 3.7 g/dL (ref 3.5–5.0)
Alkaline Phosphatase: 94 U/L (ref 38–126)
Anion gap: 8 (ref 5–15)
BUN: 21 mg/dL (ref 8–23)
CO2: 24 mmol/L (ref 22–32)
Calcium: 9.7 mg/dL (ref 8.9–10.3)
Chloride: 107 mmol/L (ref 98–111)
Creatinine: 1.18 mg/dL — ABNORMAL HIGH (ref 0.44–1.00)
GFR, Estimated: 49 mL/min — ABNORMAL LOW (ref >60)
Glucose, Bld: 88 mg/dL (ref 70–99)
Potassium: 4.4 mmol/L (ref 3.5–5.1)
Sodium: 139 mmol/L (ref 135–145)
Total Bilirubin: 0.8 mg/dL (ref 0.3–1.2)
Total Protein: 7.7 g/dL (ref 6.5–8.1)

## 2021-07-21 LAB — CBC WITH DIFFERENTIAL (CANCER CENTER ONLY)
Abs Immature Granulocytes: 0.02 10*3/uL (ref 0.00–0.07)
Basophils Absolute: 0.1 10*3/uL (ref 0.0–0.1)
Basophils Relative: 1 %
Eosinophils Absolute: 0.1 10*3/uL (ref 0.0–0.5)
Eosinophils Relative: 1 %
HCT: 35.7 % — ABNORMAL LOW (ref 36.0–46.0)
Hemoglobin: 11.4 g/dL — ABNORMAL LOW (ref 12.0–15.0)
Immature Granulocytes: 0 %
Lymphocytes Relative: 28 %
Lymphs Abs: 2 10*3/uL (ref 0.7–4.0)
MCH: 29.2 pg (ref 26.0–34.0)
MCHC: 31.9 g/dL (ref 30.0–36.0)
MCV: 91.3 fL (ref 80.0–100.0)
Monocytes Absolute: 0.4 10*3/uL (ref 0.1–1.0)
Monocytes Relative: 6 %
Neutro Abs: 4.6 10*3/uL (ref 1.7–7.7)
Neutrophils Relative %: 64 %
Platelet Count: 366 10*3/uL (ref 150–400)
RBC: 3.91 MIL/uL (ref 3.87–5.11)
RDW: 15.5 % (ref 11.5–15.5)
WBC Count: 7.2 10*3/uL (ref 4.0–10.5)
nRBC: 0 % (ref 0.0–0.2)

## 2021-07-21 LAB — MAGNESIUM: Magnesium: 1.9 mg/dL (ref 1.7–2.4)

## 2021-07-21 MED ORDER — MORPHINE SULFATE ER 15 MG PO TBCR: 15.0000 mg | EXTENDED_RELEASE_TABLET | Freq: Two times a day (BID) | ORAL | 0 refills | Status: DC

## 2021-07-21 NOTE — Progress Notes (Signed)
?[place] ?OFFICE PROGRESS NOTE ? ? ?Diagnosis: Gastrointestinal stromal tumor ? ?INTERVAL HISTORY:  ? ?Brianna Walker returns as scheduled.  She resumed regorafenib 40 mg daily 07/08/2021.  She denies diarrhea.  She is having frequent solid bowel movements, estimates 5/day.  No nausea or vomiting.  No mouth sores.  No bleeding.  No rash.  She continues to have rectal pain.  She is taking Percocet every 4 hours. ? ?Objective: ? ?Vital signs in last 24 hours: ? ?Blood pressure 130/80, pulse 80, temperature (!) 97.4 ?F (36.3 ?C), temperature source Tympanic, resp. rate 18, weight 127 lb 12.8 oz (58 kg), SpO2 98 %. ?  ? ?HEENT: No thrush or ulcers.  Mucous membranes are moist. ?Resp: Lungs clear bilaterally. ?Cardio: Regular rate and rhythm. ?GI: No hepatosplenomegaly.  No mass.  Right abdominal hernia. ?Vascular: No leg edema. ?Skin: Palms without erythema. ? ? ?Lab Results: ? ?Lab Results  ?Component Value Date  ? WBC 7.2 07/21/2021  ? HGB 11.4 (L) 07/21/2021  ? HCT 35.7 (L) 07/21/2021  ? MCV 91.3 07/21/2021  ? PLT 366 07/21/2021  ? NEUTROABS 4.6 07/21/2021  ? ? ?Imaging: ? ?No results found. ? ?Medications: I have reviewed the patient's current medications. ? ?Assessment/Plan: ?Gastrointestinal stromal tumor of the rectum ?Status post an endoscopic ultrasound on 08/06/2014 confirming a mass abutting the distal rectal wall with an FNA biopsy confirming a gastrointestinal stromal tumor ?Initiation of Gleevec 09/04/2014 ?MRI pelvis 12/15/2014 with interval decreased size of the large anorectal mass with central necrosis. ?Continuation of Gleevec ?[place] placed on hold 06/02/2015 in anticipation of surgery ?06/04/2015 status post low anterior resection and diverting loop ileostomy, resection of tumor ?Pathology: 9.5 cm tumor; GISTsubtype spindle; mitotic rate 1/50 high-power field; positive margin of resection; c-Kit mutation detected (Exon 13: c.1924A>G (p.K642E)) ?Adjuvant Gleevec 08/19/2015  (three-year in total course planned) ?Ileostomy takedown 01/28/2016 ?Held 01/28/2016 through 02/14/2016, resumed 02/15/2016 ?Gleevec stop 04/11/2016-04/18/2016, and 04/26/2016-05/08/2016 ?Gleevec resumed at a dose of 200 mg daily on 05/09/2016 ?Gleevec dose increased to 300 mg daily beginning 06/20/2016 ?Gleevec dose decreased to 200 mg daily beginning 06/23/2016 (poor tolerance at 300 mg dose) ?CTs 11/22/2017- enhancing soft tissue mass in the presacral space measuring 5.4 x 3.5 cm.  Enlarged pelvic lymph nodes. ?Gleevec continued at a dose of 200 mg daily ?Restaging CTs 03/11/2018- interval decrease in size of the presacral soft tissue mass.  Stable small pelvic lymph nodes. ?Gleevec discontinued 03/13/2018 ?CT 11/14/2018- new 2.4 cm left pelvic nodule at the piriformis muscle, stable presacral lymph nodes, right abdominal wall hernia, ventral hernia ?Gleevec 200 mg daily 12/04/2018 ?CTs 03/31/2019 -decrease in size of soft tissue nodule deep left pelvis.  5 nodules in the sigmoid mesocolon stable to slightly larger.  Stable presacral soft tissue thickening.  No findings for new metastatic disease. ?CT abdomen/pelvis 09/01/2019-progressive presacral and left perirectal nodularity.  Additional mesenteric nodules along the sigmoid mesocolon and left pelvis unchanged. ?Gleevec dose escalated to 300 mg daily 09/02/2019 ?Gleevec dose escalated to 400 mg alternating with 300 mg beginning 09/16/2019 ?Gleevec 300 mg daily beginning 09/30/2019 (unable to tolerate 400 mg due to diarrhea) ?CT 12/15/2019-pelvic/peritoneal nodularity-unchanged ?CT 06/02/2020-mildly worsened soft tissue density between the lower rectum and the sacrum. Mildly worsened superior anorectal adenopathy and perirectal adenopathy.  Office review of CT consistent with stable disease ?Gleevec 300 mg daily continued ?Gleevec dose reduced to 200 mg daily beginning 01/07/2021 (diarrhea) ?[place] placed on hold 01/27/2021 due to diarrhea ?CT abdomen/pelvis 03/04/2021-no  irregular annular wall thickening  in the remnant rectum contiguous with presacral space, compatible with recurrent tumor.  Small rectovaginal fistula identified.  Small amount of gas and rectal contrast in the vagina and vaginal introitus.  Interval progression of metastatic adenopathy in the left perirectal, lower left pelvic sidewall and high presacral chains. ?Sunitinib 37.5 mg daily beginning 04/04/2021 ?Sunitinib dose reduced to 25 mg daily 05/06/2021 due to diarrhea, mild neutropenia (she began the reduced dose on 05/15/2021) ?Sutent placed on hold 05/19/2021 due to diarrhea, weight loss, failure to thrive ?Sunitinib resumed 25 mg daily 05/26/2021 ?Sunitinib discontinued 05/30/2021 secondary to persistent diarrhea ?CTs 06/15/2021-similar appearance of annular wall thickening with luminal narrowing in the remnant rectum.  Previously noted small rectovaginal fistula again identified with gas noted within the lumen of the vagina.  Mild increase in the size of left perirectal left pelvic sidewall soft tissue nodules.  Large lobulated mass within the presacral soft tissue region not significantly changed in the interval. ?Regorafenib 06/22/2021-discontinued 07/01/2021 secondary to diarrhea ?Regorafenib resumed at a dose of 40 mg daily 07/08/2021 ?Remote history of pulmonary embolism-maintained on apixaban ?Multiple colon polyps noted on the colonoscopy 07/21/2014 with the pathology revealing tubular adenomas ?Hospitalization 06/21/2015 through 07/06/2015 with nausea/vomiting, high ileostomy output; found to have delayed gastric emptying. ?Water soluble contrast enema 08/13/2015 positive for a small to moderate volume of water soluble contrast leakage from the low anterior resection and anastomosis site in the pelvis ?Ileostomy takedown and resection of cystic abdominal wall mass ( benign pathology) on 01/28/2016 ?Anemia with ferritin in the low normal range 05/23/2016 and 06/20/2016. Ferrous sulfate increased to twice daily  06/26/2016. Improved. ?  ?  ? ?Disposition: Brianna Walker appears stable.  She is taking Regorafenib 40 mg daily and notes improved tolerance.  She is no longer having diarrhea.  Plan to continue the same. ? ?She continues to have rectal pain and is taking Percocet every 4 hours.  She is interested in a long-acting pain medication.  She will begin MS Contin 15 mg every 12 hours.  She will decrease use of Percocet as able. ? ?She will return for lab and follow-up in 2 weeks.  She will contact the office in the interim with any problems. ? ? ? ?Ned Card ANP/GNP-BC  ? ?07/21/2021  ?11:08 AM ? ? ? ? ? ? ? ?

## 2021-07-27 ENCOUNTER — Telehealth: Payer: Self-pay

## 2021-07-27 ENCOUNTER — Other Ambulatory Visit: Payer: Self-pay | Admitting: Nurse Practitioner

## 2021-07-27 DIAGNOSIS — C49A4 Gastrointestinal stromal tumor of large intestine: Secondary | ICD-10-CM

## 2021-07-27 MED ORDER — OXYCODONE-ACETAMINOPHEN 5-325 MG PO TABS: 1.0000 | ORAL_TABLET | ORAL | 0 refills | Status: DC | PRN

## 2021-07-27 NOTE — Telephone Encounter (Signed)
Called and request a refill of Percocet, placed the request on the NP's desk ?

## 2021-07-28 ENCOUNTER — Other Ambulatory Visit (HOSPITAL_COMMUNITY): Payer: Self-pay

## 2021-07-29 ENCOUNTER — Other Ambulatory Visit (HOSPITAL_COMMUNITY): Payer: Self-pay

## 2021-07-29 ENCOUNTER — Other Ambulatory Visit: Payer: Self-pay | Admitting: *Deleted

## 2021-07-29 MED ORDER — REGORAFENIB 40 MG PO TABS
40.0000 mg | ORAL_TABLET | Freq: Every day | ORAL | 0 refills | Status: DC
  Filled 2021-07-29: qty 21, 28d supply, fill #0

## 2021-08-02 ENCOUNTER — Other Ambulatory Visit (HOSPITAL_COMMUNITY): Payer: Self-pay

## 2021-08-04 ENCOUNTER — Inpatient Hospital Stay: Payer: Medicare HMO | Admitting: Nurse Practitioner

## 2021-08-04 ENCOUNTER — Inpatient Hospital Stay: Payer: Medicare HMO

## 2021-08-08 ENCOUNTER — Other Ambulatory Visit: Payer: Self-pay | Admitting: Oncology

## 2021-08-08 DIAGNOSIS — C49A4 Gastrointestinal stromal tumor of large intestine: Secondary | ICD-10-CM

## 2021-08-08 MED ORDER — OXYCODONE-ACETAMINOPHEN 5-325 MG PO TABS: 1.0000 | ORAL_TABLET | ORAL | 0 refills | Status: DC | PRN

## 2021-08-11 ENCOUNTER — Telehealth: Payer: Self-pay

## 2021-08-11 ENCOUNTER — Other Ambulatory Visit: Payer: Self-pay | Admitting: Oncology

## 2021-08-11 DIAGNOSIS — C49A4 Gastrointestinal stromal tumor of large intestine: Secondary | ICD-10-CM

## 2021-08-11 MED ORDER — OXYCODONE-ACETAMINOPHEN 5-325 MG PO TABS: 1.0000 | ORAL_TABLET | ORAL | 0 refills | Status: DC | PRN

## 2021-08-11 NOTE — Telephone Encounter (Signed)
TC from Pt inquiring about pain medication refill informed Pt that prescription was sent in on Monday. Pt stated prescription was sent to wrong pharmacy. Informed Pt prescription will be resent to [facility] on [address]. Pt verbalized understanding. ?

## 2021-08-15 ENCOUNTER — Inpatient Hospital Stay: Payer: Medicare HMO | Attending: Oncology

## 2021-08-15 ENCOUNTER — Encounter: Payer: Self-pay | Admitting: Nurse Practitioner

## 2021-08-15 ENCOUNTER — Inpatient Hospital Stay (HOSPITAL_BASED_OUTPATIENT_CLINIC_OR_DEPARTMENT_OTHER): Payer: Medicare HMO | Admitting: Nurse Practitioner

## 2021-08-15 VITALS — BP 124/80 | HR 100 | Temp 98.2°F | Resp 18 | Ht 64.0 in | Wt 118.6 lb

## 2021-08-15 DIAGNOSIS — Z79891 Long term (current) use of opiate analgesic: Secondary | ICD-10-CM | POA: Insufficient documentation

## 2021-08-15 DIAGNOSIS — G893 Neoplasm related pain (acute) (chronic): Secondary | ICD-10-CM | POA: Insufficient documentation

## 2021-08-15 DIAGNOSIS — D649 Anemia, unspecified: Secondary | ICD-10-CM | POA: Diagnosis not present

## 2021-08-15 DIAGNOSIS — C49A4 Gastrointestinal stromal tumor of large intestine: Secondary | ICD-10-CM

## 2021-08-15 DIAGNOSIS — Z7901 Long term (current) use of anticoagulants: Secondary | ICD-10-CM | POA: Insufficient documentation

## 2021-08-15 DIAGNOSIS — Z86711 Personal history of pulmonary embolism: Secondary | ICD-10-CM | POA: Diagnosis not present

## 2021-08-15 DIAGNOSIS — C49A5 Gastrointestinal stromal tumor of rectum: Secondary | ICD-10-CM | POA: Diagnosis not present

## 2021-08-15 LAB — CMP (CANCER CENTER ONLY)
ALT: 7 U/L (ref 0–44)
AST: 13 U/L — ABNORMAL LOW (ref 15–41)
Albumin: 3.3 g/dL — ABNORMAL LOW (ref 3.5–5.0)
Alkaline Phosphatase: 85 U/L (ref 38–126)
Anion gap: 9 (ref 5–15)
BUN: 17 mg/dL (ref 8–23)
CO2: 22 mmol/L (ref 22–32)
Calcium: 9.1 mg/dL (ref 8.9–10.3)
Chloride: 107 mmol/L (ref 98–111)
Creatinine: 1.09 mg/dL — ABNORMAL HIGH (ref 0.44–1.00)
GFR, Estimated: 54 mL/min — ABNORMAL LOW (ref >60)
Glucose, Bld: 110 mg/dL — ABNORMAL HIGH (ref 70–99)
Potassium: 4.3 mmol/L (ref 3.5–5.1)
Sodium: 138 mmol/L (ref 135–145)
Total Bilirubin: 0.5 mg/dL (ref 0.3–1.2)
Total Protein: 7 g/dL (ref 6.5–8.1)

## 2021-08-15 LAB — CBC WITH DIFFERENTIAL (CANCER CENTER ONLY)
Abs Immature Granulocytes: 0.07 10*3/uL (ref 0.00–0.07)
Basophils Absolute: 0 10*3/uL (ref 0.0–0.1)
Basophils Relative: 0 %
Eosinophils Absolute: 0 10*3/uL (ref 0.0–0.5)
Eosinophils Relative: 0 %
HCT: 33 % — ABNORMAL LOW (ref 36.0–46.0)
Hemoglobin: 10.7 g/dL — ABNORMAL LOW (ref 12.0–15.0)
Immature Granulocytes: 1 %
Lymphocytes Relative: 17 %
Lymphs Abs: 1.5 10*3/uL (ref 0.7–4.0)
MCH: 28.8 pg (ref 26.0–34.0)
MCHC: 32.4 g/dL (ref 30.0–36.0)
MCV: 88.7 fL (ref 80.0–100.0)
Monocytes Absolute: 0.4 10*3/uL (ref 0.1–1.0)
Monocytes Relative: 5 %
Neutro Abs: 7.1 10*3/uL (ref 1.7–7.7)
Neutrophils Relative %: 77 %
Platelet Count: 422 10*3/uL — ABNORMAL HIGH (ref 150–400)
RBC: 3.72 MIL/uL — ABNORMAL LOW (ref 3.87–5.11)
RDW: 14.4 % (ref 11.5–15.5)
WBC Count: 9.2 10*3/uL (ref 4.0–10.5)
nRBC: 0 % (ref 0.0–0.2)

## 2021-08-15 NOTE — Progress Notes (Signed)
?[facility] ?OFFICE PROGRESS NOTE ? ? ?Diagnosis: Gastrointestinal stromal tumor ? ?INTERVAL HISTORY:  ? ?Brianna Walker returns as scheduled.  She continues regorafenib.  The diarrhea is better.  No rash.  She continues to lose weight.  Pain is poorly controlled.  She is taking 2 Percocet every 4 hours as needed with partial relief.  She did not start MS Contin as we have previously prescribed due to concern over side effects. ? ?Objective: ? ?Vital signs in last 24 hours: ? ?Blood pressure 124/80, pulse 100, temperature 98.2 ?F (36.8 ?C), temperature source Oral, resp. rate 18, height _0  (1.626 m), weight 118 lb 9.6 oz (53.8 kg), SpO2 100 %. ?  ? ?HEENT: No thrush or ulcers. ?Resp: Lungs clear bilaterally. ?Cardio: Regular rate and rhythm. ?GI: Abdomen soft and nontender.  No hepatomegaly.  Right abdominal hernia. ?Vascular: No leg edema. ? ? ?Lab Results: ? ?Lab Results  ?Component Value Date  ? WBC 9.2 08/15/2021  ? HGB 10.7 (L) 08/15/2021  ? HCT 33.0 (L) 08/15/2021  ? MCV 88.7 08/15/2021  ? PLT 422 (H) 08/15/2021  ? NEUTROABS 7.1 08/15/2021  ? ? ?Imaging: ? ?No results found. ? ?Medications: I have reviewed the patient's current medications. ? ?Assessment/Plan: ?Gastrointestinal stromal tumor of the rectum ?Status post an endoscopic ultrasound on 08/06/2014 confirming a mass abutting the distal rectal wall with an FNA biopsy confirming a gastrointestinal stromal tumor ?Initiation of Gleevec 09/04/2014 ?MRI pelvis 12/15/2014 with interval decreased size of the large anorectal mass with central necrosis. ?Continuation of Gleevec ?[place] placed on hold 06/02/2015 in anticipation of surgery ?06/04/2015 status post low anterior resection and diverting loop ileostomy, resection of tumor ?Pathology: 9.5 cm tumor; GISTsubtype spindle; mitotic rate 1/50 high-power field; positive margin of resection; c-Kit mutation detected (Exon 13: c.1924A>G (p.K642E)) ?Adjuvant Gleevec 08/19/2015 (three-year in  total course planned) ?Ileostomy takedown 01/28/2016 ?Held 01/28/2016 through 02/14/2016, resumed 02/15/2016 ?Gleevec stop 04/11/2016-04/18/2016, and 04/26/2016-05/08/2016 ?Gleevec resumed at a dose of 200 mg daily on 05/09/2016 ?Gleevec dose increased to 300 mg daily beginning 06/20/2016 ?Gleevec dose decreased to 200 mg daily beginning 06/23/2016 (poor tolerance at 300 mg dose) ?CTs 11/22/2017- enhancing soft tissue mass in the presacral space measuring 5.4 x 3.5 cm.  Enlarged pelvic lymph nodes. ?Gleevec continued at a dose of 200 mg daily ?Restaging CTs 03/11/2018- interval decrease in size of the presacral soft tissue mass.  Stable small pelvic lymph nodes. ?Gleevec discontinued 03/13/2018 ?CT 11/14/2018- new 2.4 cm left pelvic nodule at the piriformis muscle, stable presacral lymph nodes, right abdominal wall hernia, ventral hernia ?Gleevec 200 mg daily 12/04/2018 ?CTs 03/31/2019 -decrease in size of soft tissue nodule deep left pelvis.  5 nodules in the sigmoid mesocolon stable to slightly larger.  Stable presacral soft tissue thickening.  No findings for new metastatic disease. ?CT abdomen/pelvis 09/01/2019-progressive presacral and left perirectal nodularity.  Additional mesenteric nodules along the sigmoid mesocolon and left pelvis unchanged. ?Gleevec dose escalated to 300 mg daily 09/02/2019 ?Gleevec dose escalated to 400 mg alternating with 300 mg beginning 09/16/2019 ?Gleevec 300 mg daily beginning 09/30/2019 (unable to tolerate 400 mg due to diarrhea) ?CT 12/15/2019-pelvic/peritoneal nodularity-unchanged ?CT 06/02/2020-mildly worsened soft tissue density between the lower rectum and the sacrum. Mildly worsened superior anorectal adenopathy and perirectal adenopathy.  Office review of CT consistent with stable disease ?Gleevec 300 mg daily continued ?Gleevec dose reduced to 200 mg daily beginning 01/07/2021 (diarrhea) ?[place] placed on hold 01/27/2021 due to diarrhea ?CT abdomen/pelvis 03/04/2021-no irregular annular  wall  thickening in the remnant rectum contiguous with presacral space, compatible with recurrent tumor.  Small rectovaginal fistula identified.  Small amount of gas and rectal contrast in the vagina and vaginal introitus.  Interval progression of metastatic adenopathy in the left perirectal, lower left pelvic sidewall and high presacral chains. ?Sunitinib 37.5 mg daily beginning 04/04/2021 ?Sunitinib dose reduced to 25 mg daily 05/06/2021 due to diarrhea, mild neutropenia (she began the reduced dose on 05/15/2021) ?Sutent placed on hold 05/19/2021 due to diarrhea, weight loss, failure to thrive ?Sunitinib resumed 25 mg daily 05/26/2021 ?Sunitinib discontinued 05/30/2021 secondary to persistent diarrhea ?CTs 06/15/2021-similar appearance of annular wall thickening with luminal narrowing in the remnant rectum.  Previously noted small rectovaginal fistula again identified with gas noted within the lumen of the vagina.  Mild increase in the size of left perirectal left pelvic sidewall soft tissue nodules.  Large lobulated mass within the presacral soft tissue region not significantly changed in the interval. ?Regorafenib 06/22/2021-discontinued 07/01/2021 secondary to diarrhea ?Regorafenib resumed at a dose of 40 mg daily 07/08/2021 ?Remote history of pulmonary embolism-maintained on apixaban ?Multiple colon polyps noted on the colonoscopy 07/21/2014 with the pathology revealing tubular adenomas ?Hospitalization 06/21/2015 through 07/06/2015 with nausea/vomiting, high ileostomy output; found to have delayed gastric emptying. ?Water soluble contrast enema 08/13/2015 positive for a small to moderate volume of water soluble contrast leakage from the low anterior resection and anastomosis site in the pelvis ?Ileostomy takedown and resection of cystic abdominal wall mass ( benign pathology) on 01/28/2016 ?Anemia with ferritin in the low normal range 05/23/2016 and 06/20/2016. Ferrous sulfate increased to twice daily 06/26/2016.  Improved. ?  ?  ? ?Disposition: Brianna Walker appears unchanged.  She continues regorafenib.  Performance status is poor.  Pain is not well controlled.  She agrees to begin MS Contin and will continue Percocet as needed.  She will return for follow-up in approximately 2 weeks.  Plan for restaging CTs if her performance status and pain have not improved at the time of her next visit. ? ?Patient seen with Dr. Benay Spice. ? ? ? ?Ned Card ANP/GNP-BC  ? ?08/15/2021  ?1:47 PM ?This was a shared visit with Ned Card.  Brianna Walker was interviewed and examined.  Her performance status continues to decline.  We adjusted the narcotic pain regimen today.  The plan is to proceed with a restaging CT if she continues to lose weight. ? ?I was present for greater than 50% of today's visit.  I performed medical decision making. ? ?Julieanne Manson, MD ? ? ? ? ? ? ?

## 2021-08-19 ENCOUNTER — Other Ambulatory Visit: Payer: Self-pay | Admitting: Nurse Practitioner

## 2021-08-19 ENCOUNTER — Telehealth: Payer: Self-pay

## 2021-08-19 DIAGNOSIS — C49A4 Gastrointestinal stromal tumor of large intestine: Secondary | ICD-10-CM

## 2021-08-19 MED ORDER — MORPHINE SULFATE ER 15 MG PO TBCR: 15.0000 mg | EXTENDED_RELEASE_TABLET | Freq: Two times a day (BID) | ORAL | 0 refills | Status: DC

## 2021-08-19 MED ORDER — OXYCODONE-ACETAMINOPHEN 5-325 MG PO TABS: 1.0000 | ORAL_TABLET | ORAL | 0 refills | Status: DC | PRN

## 2021-08-19 NOTE — Telephone Encounter (Signed)
Patient called in for a refill of her oxycodone and MS Contin. The requested was place on NP's desk. ?

## 2021-08-22 ENCOUNTER — Ambulatory Visit: Payer: Medicare HMO | Admitting: Nurse Practitioner

## 2021-08-22 ENCOUNTER — Other Ambulatory Visit: Payer: Medicare HMO

## 2021-08-24 ENCOUNTER — Other Ambulatory Visit: Payer: Self-pay | Admitting: Oncology

## 2021-08-24 ENCOUNTER — Other Ambulatory Visit (HOSPITAL_COMMUNITY): Payer: Self-pay

## 2021-08-25 ENCOUNTER — Other Ambulatory Visit (HOSPITAL_COMMUNITY): Payer: Self-pay

## 2021-08-25 ENCOUNTER — Telehealth: Payer: Self-pay

## 2021-08-25 NOTE — Telephone Encounter (Signed)
Patient called in stated she have a question about her pain pill. Called the patient back and she wanted to know if she can take acetaminophen with her morphine. I ask the patient was taking her Percocet/ Roxicet with the morphine. She stated no she did not know if she can take both. I advice the patient to start back taking her Percocet/ Roxicet with the morphine.  Patient gave verbal understanding. ?

## 2021-08-26 ENCOUNTER — Other Ambulatory Visit (HOSPITAL_COMMUNITY): Payer: Self-pay

## 2021-08-26 ENCOUNTER — Inpatient Hospital Stay (HOSPITAL_BASED_OUTPATIENT_CLINIC_OR_DEPARTMENT_OTHER): Payer: Medicare HMO | Admitting: Oncology

## 2021-08-26 ENCOUNTER — Inpatient Hospital Stay: Payer: Medicare HMO

## 2021-08-26 VITALS — BP 110/80 | HR 100 | Temp 97.8°F | Resp 18 | Ht 64.0 in | Wt 119.0 lb

## 2021-08-26 DIAGNOSIS — D649 Anemia, unspecified: Secondary | ICD-10-CM | POA: Diagnosis not present

## 2021-08-26 DIAGNOSIS — C49A5 Gastrointestinal stromal tumor of rectum: Secondary | ICD-10-CM | POA: Diagnosis not present

## 2021-08-26 DIAGNOSIS — Z7901 Long term (current) use of anticoagulants: Secondary | ICD-10-CM | POA: Diagnosis not present

## 2021-08-26 DIAGNOSIS — C49A4 Gastrointestinal stromal tumor of large intestine: Secondary | ICD-10-CM

## 2021-08-26 DIAGNOSIS — G893 Neoplasm related pain (acute) (chronic): Secondary | ICD-10-CM | POA: Diagnosis not present

## 2021-08-26 DIAGNOSIS — Z86711 Personal history of pulmonary embolism: Secondary | ICD-10-CM | POA: Diagnosis not present

## 2021-08-26 DIAGNOSIS — Z79891 Long term (current) use of opiate analgesic: Secondary | ICD-10-CM | POA: Diagnosis not present

## 2021-08-26 LAB — CBC WITH DIFFERENTIAL (CANCER CENTER ONLY)
Abs Immature Granulocytes: 0.02 10*3/uL (ref 0.00–0.07)
Basophils Absolute: 0 10*3/uL (ref 0.0–0.1)
Basophils Relative: 0 %
Eosinophils Absolute: 0.1 10*3/uL (ref 0.0–0.5)
Eosinophils Relative: 1 %
HCT: 37.1 % (ref 36.0–46.0)
Hemoglobin: 11.7 g/dL — ABNORMAL LOW (ref 12.0–15.0)
Immature Granulocytes: 0 %
Lymphocytes Relative: 24 %
Lymphs Abs: 2.4 10*3/uL (ref 0.7–4.0)
MCH: 28.4 pg (ref 26.0–34.0)
MCHC: 31.5 g/dL (ref 30.0–36.0)
MCV: 90 fL (ref 80.0–100.0)
Monocytes Absolute: 0.8 10*3/uL (ref 0.1–1.0)
Monocytes Relative: 7 %
Neutro Abs: 6.9 10*3/uL (ref 1.7–7.7)
Neutrophils Relative %: 68 %
Platelet Count: 397 10*3/uL (ref 150–400)
RBC: 4.12 MIL/uL (ref 3.87–5.11)
RDW: 14.7 % (ref 11.5–15.5)
WBC Count: 10.2 10*3/uL (ref 4.0–10.5)
nRBC: 0 % (ref 0.0–0.2)

## 2021-08-26 LAB — CMP (CANCER CENTER ONLY)
ALT: 12 U/L (ref 0–44)
AST: 15 U/L (ref 15–41)
Albumin: 3.5 g/dL (ref 3.5–5.0)
Alkaline Phosphatase: 79 U/L (ref 38–126)
Anion gap: 14 (ref 5–15)
BUN: 19 mg/dL (ref 8–23)
CO2: 20 mmol/L — ABNORMAL LOW (ref 22–32)
Calcium: 10.4 mg/dL — ABNORMAL HIGH (ref 8.9–10.3)
Chloride: 105 mmol/L (ref 98–111)
Creatinine: 1.25 mg/dL — ABNORMAL HIGH (ref 0.44–1.00)
GFR, Estimated: 46 mL/min — ABNORMAL LOW (ref >60)
Glucose, Bld: 117 mg/dL — ABNORMAL HIGH (ref 70–99)
Potassium: 3.7 mmol/L (ref 3.5–5.1)
Sodium: 139 mmol/L (ref 135–145)
Total Bilirubin: 0.9 mg/dL (ref 0.3–1.2)
Total Protein: 7.3 g/dL (ref 6.5–8.1)

## 2021-08-26 MED ORDER — STIVARGA 40 MG PO TABS
40.0000 mg | ORAL_TABLET | Freq: Every day | ORAL | 0 refills | Status: DC
  Filled 2021-08-26: qty 21, 21d supply, fill #0

## 2021-08-26 NOTE — Progress Notes (Signed)
?[facility] ?OFFICE PROGRESS NOTE ? ? ?Diagnosis: Gastrointestinal tumor ? ?INTERVAL HISTORY:  ? ?Ms. Crownover returns as scheduled.  She is here with her sister.  Her pain is improved since starting MS Contin.  She takes oxycodone for breakthrough pain.  She has intermittent diarrhea.  She takes Imodium and Lomotil.  She will complete 3 weeks of regorafenib today. ? ?Objective: ? ?Vital signs in last 24 hours: ? ?Blood pressure 110/80, pulse 100, temperature 97.8 ?F (36.6 ?C), temperature source Oral, resp. rate 18, height '5\' 4"'  (1.626 m), weight 119 lb (54 kg), SpO2 99 %. ?  ? ?Resp: Lungs clear bilaterally ?Cardio: Regular rate and rhythm ?GI: Nontender, no hepatomegaly, no mass, reducible right abdominal hernia ?Vascular: No leg edema  ?  ? ?Lab Results: ? ?Lab Results  ?Component Value Date  ? WBC 10.2 08/26/2021  ? HGB 11.7 (L) 08/26/2021  ? HCT 37.1 08/26/2021  ? MCV 90.0 08/26/2021  ? PLT 397 08/26/2021  ? NEUTROABS 6.9 08/26/2021  ? ? ?CMP  ?Lab Results  ?Component Value Date  ? NA 139 08/26/2021  ? K 3.7 08/26/2021  ? CL 105 08/26/2021  ? CO2 20 (L) 08/26/2021  ? GLUCOSE 117 (H) 08/26/2021  ? BUN 19 08/26/2021  ? CREATININE 1.25 (H) 08/26/2021  ? CALCIUM 10.4 (H) 08/26/2021  ? PROT 7.3 08/26/2021  ? ALBUMIN 3.5 08/26/2021  ? AST 15 08/26/2021  ? ALT 12 08/26/2021  ? ALKPHOS 79 08/26/2021  ? BILITOT 0.9 08/26/2021  ? GFRNONAA 46 (L) 08/26/2021  ? GFRAA >60 12/05/2019  ? ? ? ?Medications: I have reviewed the patient's current medications. ? ? ?Assessment/Plan: ?Gastrointestinal stromal tumor of the rectum ?Status post an endoscopic ultrasound on 08/06/2014 confirming a mass abutting the distal rectal wall with an FNA biopsy confirming a gastrointestinal stromal tumor ?Initiation of Gleevec 09/04/2014 ?MRI pelvis 12/15/2014 with interval decreased size of the large anorectal mass with central necrosis. ?Continuation of Gleevec ?[place] placed on hold 06/02/2015 in anticipation of  surgery ?06/04/2015 status post low anterior resection and diverting loop ileostomy, resection of tumor ?Pathology: 9.5 cm tumor; GISTsubtype spindle; mitotic rate 1/50 high-power field; positive margin of resection; c-Kit mutation detected (Exon 13: c.1924A>G (p.[id number])) ?Adjuvant Gleevec 08/19/2015 (three-year in total course planned) ?Ileostomy takedown 01/28/2016 ?Held 01/28/2016 through 02/14/2016, resumed 02/15/2016 ?Gleevec stop 04/11/2016-04/18/2016, and 04/26/2016-05/08/2016 ?Gleevec resumed at a dose of 200 mg daily on 05/09/2016 ?Gleevec dose increased to 300 mg daily beginning 06/20/2016 ?Gleevec dose decreased to 200 mg daily beginning 06/23/2016 (poor tolerance at 300 mg dose) ?CTs 11/22/2017- enhancing soft tissue mass in the presacral space measuring 5.4 x 3.5 cm.  Enlarged pelvic lymph nodes. ?Gleevec continued at a dose of 200 mg daily ?Restaging CTs 03/11/2018- interval decrease in size of the presacral soft tissue mass.  Stable small pelvic lymph nodes. ?Gleevec discontinued 03/13/2018 ?CT 11/14/2018- new 2.4 cm left pelvic nodule at the piriformis muscle, stable presacral lymph nodes, right abdominal wall hernia, ventral hernia ?Gleevec 200 mg daily 12/04/2018 ?CTs 03/31/2019 -decrease in size of soft tissue nodule deep left pelvis.  5 nodules in the sigmoid mesocolon stable to slightly larger.  Stable presacral soft tissue thickening.  No findings for new metastatic disease. ?CT abdomen/pelvis 09/01/2019-progressive presacral and left perirectal nodularity.  Additional mesenteric nodules along the sigmoid mesocolon and left pelvis unchanged. ?Gleevec dose escalated to 300 mg daily 09/02/2019 ?Gleevec dose escalated to 400 mg alternating with 300 mg beginning 09/16/2019 ?Gleevec 300 mg daily beginning 09/30/2019 (unable  to tolerate 400 mg due to diarrhea) ?CT 12/15/2019-pelvic/peritoneal nodularity-unchanged ?CT 06/02/2020-mildly worsened soft tissue density between the lower rectum and the sacrum. Mildly  worsened superior anorectal adenopathy and perirectal adenopathy.  Office review of CT consistent with stable disease ?Gleevec 300 mg daily continued ?Gleevec dose reduced to 200 mg daily beginning 01/07/2021 (diarrhea) ?[place] placed on hold 01/27/2021 due to diarrhea ?CT abdomen/pelvis 03/04/2021-no irregular annular wall thickening in the remnant rectum contiguous with presacral space, compatible with recurrent tumor.  Small rectovaginal fistula identified.  Small amount of gas and rectal contrast in the vagina and vaginal introitus.  Interval progression of metastatic adenopathy in the left perirectal, lower left pelvic sidewall and high presacral chains. ?Sunitinib 37.5 mg daily beginning 04/04/2021 ?Sunitinib dose reduced to 25 mg daily 05/06/2021 due to diarrhea, mild neutropenia (she began the reduced dose on 05/15/2021) ?Sutent placed on hold 05/19/2021 due to diarrhea, weight loss, failure to thrive ?Sunitinib resumed 25 mg daily 05/26/2021 ?Sunitinib discontinued 05/30/2021 secondary to persistent diarrhea ?CTs 06/15/2021-similar appearance of annular wall thickening with luminal narrowing in the remnant rectum.  Previously noted small rectovaginal fistula again identified with gas noted within the lumen of the vagina.  Mild increase in the size of left perirectal left pelvic sidewall soft tissue nodules.  Large lobulated mass within the presacral soft tissue region not significantly changed in the interval. ?Regorafenib 06/22/2021-discontinued 07/01/2021 secondary to diarrhea ?Regorafenib resumed at a dose of 40 mg daily 07/08/2021 ?Remote history of pulmonary embolism-maintained on apixaban ?Multiple colon polyps noted on the colonoscopy 07/21/2014 with the pathology revealing tubular adenomas ?Hospitalization 06/21/2015 through 07/06/2015 with nausea/vomiting, high ileostomy output; found to have delayed gastric emptying. ?Water soluble contrast enema 08/13/2015 positive for a small to moderate volume of water  soluble contrast leakage from the low anterior resection and anastomosis site in the pelvis ?Ileostomy takedown and resection of cystic abdominal wall mass ( benign pathology) on 01/28/2016 ?Anemia with ferritin in the low normal range 05/23/2016 and 06/20/2016. Ferrous sulfate increased to twice daily 06/26/2016. Improved. ?  ?  ? ? ?Disposition: ?Ms. Rapaport has metastatic gastrointestinal stromal tumor.  She is currently being treated with regorafenib.  She appears to be tolerating regorafenib well.  Her performance status has improved since starting MS Contin.  She will complete a cycle of regorafenib today.  She will begin the next cycle of regorafenib on 09/02/2021.  She will return for an office visit in 2 weeks. ? ?Betsy Coder, MD ? ?08/26/2021  ?11:00 AM ? ? ?

## 2021-08-29 ENCOUNTER — Other Ambulatory Visit (HOSPITAL_COMMUNITY): Payer: Self-pay

## 2021-08-29 ENCOUNTER — Other Ambulatory Visit: Payer: Self-pay

## 2021-08-29 ENCOUNTER — Other Ambulatory Visit: Payer: Self-pay | Admitting: Nurse Practitioner

## 2021-08-29 ENCOUNTER — Telehealth: Payer: Self-pay

## 2021-08-29 DIAGNOSIS — C49A4 Gastrointestinal stromal tumor of large intestine: Secondary | ICD-10-CM

## 2021-08-29 MED ORDER — OXYCODONE-ACETAMINOPHEN 5-325 MG PO TABS: 1.0000 | ORAL_TABLET | ORAL | 0 refills | Status: DC | PRN

## 2021-08-29 MED ORDER — ONDANSETRON HCL 8 MG PO TABS: ORAL_TABLET | ORAL | 1 refills | Status: DC

## 2021-08-29 MED ORDER — PREDNISONE 10 MG PO TABS: 10.0000 mg | ORAL_TABLET | Freq: Every day | ORAL | 2 refills | Status: DC

## 2021-08-29 NOTE — Telephone Encounter (Signed)
Patient called in stated she need a refill of his oxycodone, prednisone, lomotil, and Zofran. The requested was placed on the Np's desk ?

## 2021-09-01 ENCOUNTER — Other Ambulatory Visit (HOSPITAL_COMMUNITY): Payer: Self-pay

## 2021-09-01 MED ORDER — DIPHENOXYLATE-ATROPINE 2.5-0.025 MG PO TABS: ORAL_TABLET | ORAL | 0 refills | Status: DC

## 2021-09-04 ENCOUNTER — Other Ambulatory Visit: Payer: Self-pay | Admitting: Internal Medicine

## 2021-09-05 ENCOUNTER — Other Ambulatory Visit: Payer: Self-pay

## 2021-09-05 ENCOUNTER — Encounter (HOSPITAL_COMMUNITY): Payer: Self-pay

## 2021-09-05 ENCOUNTER — Telehealth: Payer: Self-pay

## 2021-09-05 DIAGNOSIS — C49A4 Gastrointestinal stromal tumor of large intestine: Secondary | ICD-10-CM

## 2021-09-05 MED ORDER — LOPERAMIDE HCL 2 MG PO CAPS: 2.0000 mg | ORAL_CAPSULE | Freq: Three times a day (TID) | ORAL | 0 refills | Status: DC | PRN

## 2021-09-05 NOTE — Progress Notes (Signed)
PA for Lomotil 2.5-0.025 mg tablets approved through [facility] from 05/01/21-04/30/22.   ?

## 2021-09-05 NOTE — Telephone Encounter (Signed)
Patient called in for a refill of her imodium ?

## 2021-09-07 ENCOUNTER — Telehealth: Payer: Self-pay

## 2021-09-07 NOTE — Telephone Encounter (Signed)
Patient called in and requested a refill of her Oxy and lomotil, the request was placed on the NP's desk. ?

## 2021-09-08 ENCOUNTER — Inpatient Hospital Stay: Payer: Medicare HMO | Attending: Oncology

## 2021-09-08 ENCOUNTER — Inpatient Hospital Stay (HOSPITAL_BASED_OUTPATIENT_CLINIC_OR_DEPARTMENT_OTHER): Payer: Medicare HMO | Admitting: Nurse Practitioner

## 2021-09-08 ENCOUNTER — Encounter: Payer: Self-pay | Admitting: Nurse Practitioner

## 2021-09-08 VITALS — BP 140/80 | HR 100 | Temp 98.2°F | Resp 18 | Ht 64.0 in | Wt 117.0 lb

## 2021-09-08 DIAGNOSIS — G893 Neoplasm related pain (acute) (chronic): Secondary | ICD-10-CM | POA: Diagnosis not present

## 2021-09-08 DIAGNOSIS — C49A5 Gastrointestinal stromal tumor of rectum: Secondary | ICD-10-CM | POA: Insufficient documentation

## 2021-09-08 DIAGNOSIS — Z7901 Long term (current) use of anticoagulants: Secondary | ICD-10-CM | POA: Diagnosis not present

## 2021-09-08 DIAGNOSIS — Z86711 Personal history of pulmonary embolism: Secondary | ICD-10-CM | POA: Diagnosis not present

## 2021-09-08 DIAGNOSIS — C49A4 Gastrointestinal stromal tumor of large intestine: Secondary | ICD-10-CM

## 2021-09-08 DIAGNOSIS — Z79891 Long term (current) use of opiate analgesic: Secondary | ICD-10-CM | POA: Diagnosis not present

## 2021-09-08 LAB — CBC WITH DIFFERENTIAL (CANCER CENTER ONLY)
Abs Immature Granulocytes: 0.03 10*3/uL (ref 0.00–0.07)
Basophils Absolute: 0 10*3/uL (ref 0.0–0.1)
Basophils Relative: 1 %
Eosinophils Absolute: 0.1 10*3/uL (ref 0.0–0.5)
Eosinophils Relative: 1 %
HCT: 36.6 % (ref 36.0–46.0)
Hemoglobin: 11.4 g/dL — ABNORMAL LOW (ref 12.0–15.0)
Immature Granulocytes: 0 %
Lymphocytes Relative: 32 %
Lymphs Abs: 2.6 10*3/uL (ref 0.7–4.0)
MCH: 27.9 pg (ref 26.0–34.0)
MCHC: 31.1 g/dL (ref 30.0–36.0)
MCV: 89.7 fL (ref 80.0–100.0)
Monocytes Absolute: 0.5 10*3/uL (ref 0.1–1.0)
Monocytes Relative: 6 %
Neutro Abs: 5.1 10*3/uL (ref 1.7–7.7)
Neutrophils Relative %: 60 %
Platelet Count: 410 10*3/uL — ABNORMAL HIGH (ref 150–400)
RBC: 4.08 MIL/uL (ref 3.87–5.11)
RDW: 14.8 % (ref 11.5–15.5)
WBC Count: 8.3 10*3/uL (ref 4.0–10.5)
nRBC: 0 % (ref 0.0–0.2)

## 2021-09-08 LAB — CMP (CANCER CENTER ONLY)
ALT: 7 U/L (ref 0–44)
AST: 12 U/L — ABNORMAL LOW (ref 15–41)
Albumin: 3.4 g/dL — ABNORMAL LOW (ref 3.5–5.0)
Alkaline Phosphatase: 95 U/L (ref 38–126)
Anion gap: 9 (ref 5–15)
BUN: 13 mg/dL (ref 8–23)
CO2: 25 mmol/L (ref 22–32)
Calcium: 9.1 mg/dL (ref 8.9–10.3)
Chloride: 106 mmol/L (ref 98–111)
Creatinine: 1.13 mg/dL — ABNORMAL HIGH (ref 0.44–1.00)
GFR, Estimated: 51 mL/min — ABNORMAL LOW (ref >60)
Glucose, Bld: 88 mg/dL (ref 70–99)
Potassium: 4.1 mmol/L (ref 3.5–5.1)
Sodium: 140 mmol/L (ref 135–145)
Total Bilirubin: 0.6 mg/dL (ref 0.3–1.2)
Total Protein: 6.6 g/dL (ref 6.5–8.1)

## 2021-09-08 LAB — MAGNESIUM: Magnesium: 1.9 mg/dL (ref 1.7–2.4)

## 2021-09-08 MED ORDER — OXYCODONE-ACETAMINOPHEN 5-325 MG PO TABS: 1.0000 | ORAL_TABLET | ORAL | 0 refills | Status: DC | PRN

## 2021-09-08 MED ORDER — DIPHENOXYLATE-ATROPINE 2.5-0.025 MG PO TABS: ORAL_TABLET | ORAL | 0 refills | Status: DC

## 2021-09-08 NOTE — Progress Notes (Signed)
?[facility] ?OFFICE PROGRESS NOTE ? ? ?Diagnosis: Gastrointestinal stromal tumor ? ?INTERVAL HISTORY:  ? ?Brianna Walker returns as scheduled.  She completed a cycle of regorafenib 08/30/2021.  She plans to begin a new cycle today.  Diarrhea is better.  She notes stools are more solid.  No nausea or vomiting.  Occasional rectal bleeding.  She continues to have rectal pain.  Pain is controlled with MS Contin and oxycodone. ? ?Objective: ? ?Vital signs in last 24 hours: ? ?Blood pressure 140/80, pulse 100, temperature 98.2 ?F (36.8 ?C), temperature source Oral, resp. rate 18, height _0  (1.626 m), weight 117 lb (53.1 kg), SpO2 100 %. ?  ? ?HEENT: No thrush or ulcers. ?Resp: Lungs clear bilaterally. ?Cardio: Regular rate and rhythm. ?GI: Abdomen soft and nontender.  No hepatomegaly.  No mass.  Reducible right abdominal hernia. ?Vascular: No leg edema. ?Skin: No rash. ? ? ?Lab Results: ? ?Lab Results  ?Component Value Date  ? WBC 8.3 09/08/2021  ? HGB 11.4 (L) 09/08/2021  ? HCT 36.6 09/08/2021  ? MCV 89.7 09/08/2021  ? PLT 410 (H) 09/08/2021  ? NEUTROABS 5.1 09/08/2021  ? ? ?Imaging: ? ?No results found. ? ?Medications: I have reviewed the patient's current medications. ? ?Assessment/Plan: ?Gastrointestinal stromal tumor of the rectum ?Status post an endoscopic ultrasound on 08/06/2014 confirming a mass abutting the distal rectal wall with an FNA biopsy confirming a gastrointestinal stromal tumor ?Initiation of Gleevec 09/04/2014 ?MRI pelvis 12/15/2014 with interval decreased size of the large anorectal mass with central necrosis. ?Continuation of Gleevec ?[place] placed on hold 06/02/2015 in anticipation of surgery ?06/04/2015 status post low anterior resection and diverting loop ileostomy, resection of tumor ?Pathology: 9.5 cm tumor; GISTsubtype spindle; mitotic rate 1/50 high-power field; positive margin of resection; c-Kit mutation detected (Exon 13: c.1924A>G (p.K642E)) ?Adjuvant Gleevec 08/19/2015  (three-year in total course planned) ?Ileostomy takedown 01/28/2016 ?Held 01/28/2016 through 02/14/2016, resumed 02/15/2016 ?Gleevec stop 04/11/2016-04/18/2016, and 04/26/2016-05/08/2016 ?Gleevec resumed at a dose of 200 mg daily on 05/09/2016 ?Gleevec dose increased to 300 mg daily beginning 06/20/2016 ?Gleevec dose decreased to 200 mg daily beginning 06/23/2016 (poor tolerance at 300 mg dose) ?CTs 11/22/2017- enhancing soft tissue mass in the presacral space measuring 5.4 x 3.5 cm.  Enlarged pelvic lymph nodes. ?Gleevec continued at a dose of 200 mg daily ?Restaging CTs 03/11/2018- interval decrease in size of the presacral soft tissue mass.  Stable small pelvic lymph nodes. ?Gleevec discontinued 03/13/2018 ?CT 11/14/2018- new 2.4 cm left pelvic nodule at the piriformis muscle, stable presacral lymph nodes, right abdominal wall hernia, ventral hernia ?Gleevec 200 mg daily 12/04/2018 ?CTs 03/31/2019 -decrease in size of soft tissue nodule deep left pelvis.  5 nodules in the sigmoid mesocolon stable to slightly larger.  Stable presacral soft tissue thickening.  No findings for new metastatic disease. ?CT abdomen/pelvis 09/01/2019-progressive presacral and left perirectal nodularity.  Additional mesenteric nodules along the sigmoid mesocolon and left pelvis unchanged. ?Gleevec dose escalated to 300 mg daily 09/02/2019 ?Gleevec dose escalated to 400 mg alternating with 300 mg beginning 09/16/2019 ?Gleevec 300 mg daily beginning 09/30/2019 (unable to tolerate 400 mg due to diarrhea) ?CT 12/15/2019-pelvic/peritoneal nodularity-unchanged ?CT 06/02/2020-mildly worsened soft tissue density between the lower rectum and the sacrum. Mildly worsened superior anorectal adenopathy and perirectal adenopathy.  Office review of CT consistent with stable disease ?Gleevec 300 mg daily continued ?Gleevec dose reduced to 200 mg daily beginning 01/07/2021 (diarrhea) ?[place] placed on hold 01/27/2021 due to diarrhea ?CT abdomen/pelvis 03/04/2021-no  irregular  annular wall thickening in the remnant rectum contiguous with presacral space, compatible with recurrent tumor.  Small rectovaginal fistula identified.  Small amount of gas and rectal contrast in the vagina and vaginal introitus.  Interval progression of metastatic adenopathy in the left perirectal, lower left pelvic sidewall and high presacral chains. ?Sunitinib 37.5 mg daily beginning 04/04/2021 ?Sunitinib dose reduced to 25 mg daily 05/06/2021 due to diarrhea, mild neutropenia (she began the reduced dose on 05/15/2021) ?Sutent placed on hold 05/19/2021 due to diarrhea, weight loss, failure to thrive ?Sunitinib resumed 25 mg daily 05/26/2021 ?Sunitinib discontinued 05/30/2021 secondary to persistent diarrhea ?CTs 06/15/2021-similar appearance of annular wall thickening with luminal narrowing in the remnant rectum.  Previously noted small rectovaginal fistula again identified with gas noted within the lumen of the vagina.  Mild increase in the size of left perirectal left pelvic sidewall soft tissue nodules.  Large lobulated mass within the presacral soft tissue region not significantly changed in the interval. ?Regorafenib 06/22/2021-discontinued 07/01/2021 secondary to diarrhea ?Regorafenib resumed at a dose of 40 mg daily 07/08/2021 ?Regorafenib cycle completed 08/30/2021 ?Regorafenib cycle started 09/07/2021 ?Remote history of pulmonary embolism-maintained on apixaban ?Multiple colon polyps noted on the colonoscopy 07/21/2014 with the pathology revealing tubular adenomas ?Hospitalization 06/21/2015 through 07/06/2015 with nausea/vomiting, high ileostomy output; found to have delayed gastric emptying. ?Water soluble contrast enema 08/13/2015 positive for a small to moderate volume of water soluble contrast leakage from the low anterior resection and anastomosis site in the pelvis ?Ileostomy takedown and resection of cystic abdominal wall mass ( benign pathology) on 01/28/2016 ?Anemia with ferritin in the low normal  range 05/23/2016 and 06/20/2016. Ferrous sulfate increased to twice daily 06/26/2016. Improved. ?  ?  ? ?Disposition: Brianna Walker appears stable.  Performance status is improved.  She will begin the next cycle of regorafenib today. ? ?For pain she will continue MS Contin with Percocet as needed. ? ?CBC and chemistry panel from today reviewed.  Labs adequate to proceed with regorafenib as above. ? ?She will return for lab and follow-up in 3 weeks.  We are available to see her sooner if needed. ? ? ? ?Ned Card ANP/GNP-BC  ? ?09/08/2021  ?10:59 AM ? ? ? ? ? ? ? ?

## 2021-09-12 ENCOUNTER — Other Ambulatory Visit: Payer: Self-pay | Admitting: *Deleted

## 2021-09-12 DIAGNOSIS — C49A4 Gastrointestinal stromal tumor of large intestine: Secondary | ICD-10-CM

## 2021-09-13 ENCOUNTER — Other Ambulatory Visit: Payer: Self-pay | Admitting: Nurse Practitioner

## 2021-09-13 ENCOUNTER — Telehealth: Payer: Self-pay

## 2021-09-13 DIAGNOSIS — C49A4 Gastrointestinal stromal tumor of large intestine: Secondary | ICD-10-CM

## 2021-09-13 MED ORDER — OXYCODONE-ACETAMINOPHEN 5-325 MG PO TABS: 1.0000 | ORAL_TABLET | ORAL | 0 refills | Status: DC | PRN

## 2021-09-13 NOTE — Telephone Encounter (Signed)
Patient called in and request for a refill on her Oxycodone, place the request on the NP's desk ?

## 2021-09-20 ENCOUNTER — Other Ambulatory Visit (HOSPITAL_COMMUNITY): Payer: Self-pay

## 2021-09-21 ENCOUNTER — Other Ambulatory Visit: Payer: Self-pay | Admitting: Nurse Practitioner

## 2021-09-21 ENCOUNTER — Telehealth: Payer: Self-pay

## 2021-09-21 DIAGNOSIS — C49A4 Gastrointestinal stromal tumor of large intestine: Secondary | ICD-10-CM

## 2021-09-21 MED ORDER — OXYCODONE-ACETAMINOPHEN 5-325 MG PO TABS: 1.0000 | ORAL_TABLET | ORAL | 0 refills | Status: DC | PRN

## 2021-09-21 NOTE — Telephone Encounter (Signed)
Patient called in and requested a refill of his Oxy, the requested was placed on the NP's desk.

## 2021-09-22 ENCOUNTER — Other Ambulatory Visit: Payer: Self-pay | Admitting: Oncology

## 2021-09-22 ENCOUNTER — Other Ambulatory Visit (HOSPITAL_COMMUNITY): Payer: Self-pay

## 2021-09-23 ENCOUNTER — Other Ambulatory Visit (HOSPITAL_COMMUNITY): Payer: Self-pay

## 2021-09-23 MED ORDER — STIVARGA 40 MG PO TABS
40.0000 mg | ORAL_TABLET | Freq: Every day | ORAL | 0 refills | Status: DC
  Filled 2021-09-23: qty 21, 21d supply, fill #0

## 2021-09-27 ENCOUNTER — Telehealth: Payer: Self-pay

## 2021-09-27 ENCOUNTER — Other Ambulatory Visit: Payer: Self-pay | Admitting: Nurse Practitioner

## 2021-09-27 DIAGNOSIS — C49A4 Gastrointestinal stromal tumor of large intestine: Secondary | ICD-10-CM

## 2021-09-27 MED ORDER — MORPHINE SULFATE ER 15 MG PO TBCR: 15.0000 mg | EXTENDED_RELEASE_TABLET | Freq: Two times a day (BID) | ORAL | 0 refills | Status: DC

## 2021-09-27 MED ORDER — OXYCODONE-ACETAMINOPHEN 5-325 MG PO TABS: 1.0000 | ORAL_TABLET | ORAL | 0 refills | Status: DC | PRN

## 2021-09-27 NOTE — Telephone Encounter (Signed)
Patient called in and requested a refill of her pain medication, the requested was place on the NP's desk.

## 2021-09-29 ENCOUNTER — Inpatient Hospital Stay: Payer: Medicare HMO | Attending: Oncology

## 2021-09-29 ENCOUNTER — Inpatient Hospital Stay (HOSPITAL_BASED_OUTPATIENT_CLINIC_OR_DEPARTMENT_OTHER): Payer: Medicare HMO | Admitting: Oncology

## 2021-09-29 ENCOUNTER — Other Ambulatory Visit (HOSPITAL_COMMUNITY): Payer: Self-pay

## 2021-09-29 ENCOUNTER — Inpatient Hospital Stay: Payer: Medicare HMO

## 2021-09-29 ENCOUNTER — Other Ambulatory Visit: Payer: Self-pay | Admitting: *Deleted

## 2021-09-29 VITALS — BP 74/50 | HR 113 | Temp 98.1°F | Resp 20 | Ht 64.0 in | Wt 114.0 lb

## 2021-09-29 DIAGNOSIS — Z7901 Long term (current) use of anticoagulants: Secondary | ICD-10-CM | POA: Diagnosis not present

## 2021-09-29 DIAGNOSIS — R55 Syncope and collapse: Secondary | ICD-10-CM | POA: Diagnosis not present

## 2021-09-29 DIAGNOSIS — C49A4 Gastrointestinal stromal tumor of large intestine: Secondary | ICD-10-CM

## 2021-09-29 DIAGNOSIS — Z79891 Long term (current) use of opiate analgesic: Secondary | ICD-10-CM | POA: Insufficient documentation

## 2021-09-29 DIAGNOSIS — C49A5 Gastrointestinal stromal tumor of rectum: Secondary | ICD-10-CM | POA: Insufficient documentation

## 2021-09-29 DIAGNOSIS — E860 Dehydration: Secondary | ICD-10-CM

## 2021-09-29 DIAGNOSIS — G893 Neoplasm related pain (acute) (chronic): Secondary | ICD-10-CM | POA: Diagnosis not present

## 2021-09-29 DIAGNOSIS — R197 Diarrhea, unspecified: Secondary | ICD-10-CM | POA: Diagnosis not present

## 2021-09-29 DIAGNOSIS — Z86711 Personal history of pulmonary embolism: Secondary | ICD-10-CM | POA: Insufficient documentation

## 2021-09-29 LAB — CBC WITH DIFFERENTIAL (CANCER CENTER ONLY)
Abs Immature Granulocytes: 0.03 10*3/uL (ref 0.00–0.07)
Basophils Absolute: 0.1 10*3/uL (ref 0.0–0.1)
Basophils Relative: 1 %
Eosinophils Absolute: 0.1 10*3/uL (ref 0.0–0.5)
Eosinophils Relative: 1 %
HCT: 36.5 % (ref 36.0–46.0)
Hemoglobin: 11.3 g/dL — ABNORMAL LOW (ref 12.0–15.0)
Immature Granulocytes: 0 %
Lymphocytes Relative: 28 %
Lymphs Abs: 2.6 10*3/uL (ref 0.7–4.0)
MCH: 27.2 pg (ref 26.0–34.0)
MCHC: 31 g/dL (ref 30.0–36.0)
MCV: 87.7 fL (ref 80.0–100.0)
Monocytes Absolute: 0.6 10*3/uL (ref 0.1–1.0)
Monocytes Relative: 7 %
Neutro Abs: 5.9 10*3/uL (ref 1.7–7.7)
Neutrophils Relative %: 63 %
Platelet Count: 580 10*3/uL — ABNORMAL HIGH (ref 150–400)
RBC: 4.16 MIL/uL (ref 3.87–5.11)
RDW: 15.9 % — ABNORMAL HIGH (ref 11.5–15.5)
WBC Count: 9.3 10*3/uL (ref 4.0–10.5)
nRBC: 0 % (ref 0.0–0.2)

## 2021-09-29 LAB — CMP (CANCER CENTER ONLY)
ALT: 6 U/L (ref 0–44)
AST: 12 U/L — ABNORMAL LOW (ref 15–41)
Albumin: 3.2 g/dL — ABNORMAL LOW (ref 3.5–5.0)
Alkaline Phosphatase: 98 U/L (ref 38–126)
Anion gap: 13 (ref 5–15)
BUN: 25 mg/dL — ABNORMAL HIGH (ref 8–23)
CO2: 20 mmol/L — ABNORMAL LOW (ref 22–32)
Calcium: 9.5 mg/dL (ref 8.9–10.3)
Chloride: 104 mmol/L (ref 98–111)
Creatinine: 1.63 mg/dL — ABNORMAL HIGH (ref 0.44–1.00)
GFR, Estimated: 33 mL/min — ABNORMAL LOW (ref >60)
Glucose, Bld: 103 mg/dL — ABNORMAL HIGH (ref 70–99)
Potassium: 4.2 mmol/L (ref 3.5–5.1)
Sodium: 137 mmol/L (ref 135–145)
Total Bilirubin: 0.7 mg/dL (ref 0.3–1.2)
Total Protein: 7.2 g/dL (ref 6.5–8.1)

## 2021-09-29 LAB — MAGNESIUM: Magnesium: 2.3 mg/dL (ref 1.7–2.4)

## 2021-09-29 MED ORDER — SODIUM CHLORIDE 0.9 % IV SOLN: Freq: Once | INTRAVENOUS | Status: AC

## 2021-09-29 NOTE — Progress Notes (Signed)
Vitals after fluids communicated to Merceda Elks, RN. Per Manuela Schwartz, ok to discharge. Patient instructed to increase fluids and encouraged to call for any concerns. Verbalizes understanding and agrees to this plan.

## 2021-09-29 NOTE — Patient Instructions (Signed)

## 2021-09-29 NOTE — Progress Notes (Signed)
[facility] OFFICE PROGRESS NOTE   Diagnosis: Gastrointestinal stromal tumor  INTERVAL HISTORY:   [name] returns as scheduled.  She is here with her sister.  She completed another cycle of regorafenib yesterday.  No rash or mouth sores.  She continues to have intermittent small-volume diarrhea.  Pain is controlled with MS Contin and oxycodone.  She takes oxycodone every 4 hours. Her appetite is poor.  She is drinking fluid.  She developed presyncope symptoms in the exam room prior to being seen today.  She reports not eating or drinking today.  She feels better after eating and beginning intravenous fluids.  Objective:  Vital signs in last 24 hours:  Blood pressure (!) 74/50, pulse (!) 113, temperature 98.1 F (36.7 C), temperature source Oral, resp. rate 20, height '5\' 4"'  (1.626 m), weight 114 lb (51.7 kg), SpO2 98 %.    HEENT: No thrush or ulcers, mucous membranes are moist Resp: Lungs clear bilaterally Cardio: Regular rate and rhythm GI: Nontender, no hepatosplenomegaly, no mass, right abdominal hernia Vascular: No leg edema, diminished skin turgor  Skin: No rash  Portacath/PICC-without erythema  Lab Results:  Lab Results  Component Value Date   WBC 9.3 09/29/2021   HGB 11.3 (L) 09/29/2021   HCT 36.5 09/29/2021   MCV 87.7 09/29/2021   PLT 580 (H) 09/29/2021   NEUTROABS 5.9 09/29/2021    CMP  Lab Results  Component Value Date   NA 137 09/29/2021   K 4.2 09/29/2021   CL 104 09/29/2021   CO2 20 (L) 09/29/2021   GLUCOSE 103 (H) 09/29/2021   BUN 25 (H) 09/29/2021   CREATININE 1.63 (H) 09/29/2021   CALCIUM 9.5 09/29/2021   PROT 7.2 09/29/2021   ALBUMIN 3.2 (L) 09/29/2021   AST 12 (L) 09/29/2021   ALT 6 09/29/2021   ALKPHOS 98 09/29/2021   BILITOT 0.7 09/29/2021   GFRNONAA 33 (L) 09/29/2021   GFRAA >60 12/05/2019    Medications: I have reviewed the patient's current medications.   Assessment/Plan: Gastrointestinal stromal tumor of  the rectum Status post an endoscopic ultrasound on 08/06/2014 confirming a mass abutting the distal rectal wall with an FNA biopsy confirming a gastrointestinal stromal tumor Initiation of Gleevec 09/04/2014 MRI pelvis 12/15/2014 with interval decreased size of the large anorectal mass with central necrosis. Continuation of Gleevec Gleevec placed on hold 06/02/2015 in anticipation of surgery 06/04/2015 status post low anterior resection and diverting loop ileostomy, resection of tumor Pathology: 9.5 cm tumor; GISTsubtype spindle; mitotic rate 1/50 high-power field; positive margin of resection; c-Kit mutation detected (Exon 13: c.1924A>G (p.K642E)) Adjuvant Gleevec 08/19/2015 (three-year in total course planned) Ileostomy takedown 01/28/2016 Held 01/28/2016 through 02/14/2016, resumed 02/15/2016 Gleevec stop 04/11/2016-04/18/2016, and 04/26/2016-05/08/2016 Gleevec resumed at a dose of 200 mg daily on 05/09/2016 Gleevec dose increased to 300 mg daily beginning 06/20/2016 Gleevec dose decreased to 200 mg daily beginning 06/23/2016 (poor tolerance at 300 mg dose) CTs 11/22/2017- enhancing soft tissue mass in the presacral space measuring 5.4 x 3.5 cm.  Enlarged pelvic lymph nodes. Gleevec continued at a dose of 200 mg daily Restaging CTs 03/11/2018- interval decrease in size of the presacral soft tissue mass.  Stable small pelvic lymph nodes. Gleevec discontinued 03/13/2018 CT 11/14/2018- new 2.4 cm left pelvic nodule at the piriformis muscle, stable presacral lymph nodes, right abdominal wall hernia, ventral hernia Gleevec 200 mg daily 12/04/2018 CTs 03/31/2019 -decrease in size of soft tissue nodule deep left pelvis.  5 nodules in the sigmoid mesocolon stable  to slightly larger.  Stable presacral soft tissue thickening.  No findings for new metastatic disease. CT abdomen/pelvis 09/01/2019-progressive presacral and left perirectal nodularity.  Additional mesenteric nodules along the sigmoid mesocolon  and left pelvis unchanged. Gleevec dose escalated to 300 mg daily 09/02/2019 Gleevec dose escalated to 400 mg alternating with 300 mg beginning 09/16/2019 Gleevec 300 mg daily beginning 09/30/2019 (unable to tolerate 400 mg due to diarrhea) CT 12/15/2019-pelvic/peritoneal nodularity-unchanged CT 06/02/2020-mildly worsened soft tissue density between the lower rectum and the sacrum. Mildly worsened superior anorectal adenopathy and perirectal adenopathy.  Office review of CT consistent with stable disease Gleevec 300 mg daily continued Gleevec dose reduced to 200 mg daily beginning 01/07/2021 (diarrhea) Gleevec placed on hold 01/27/2021 due to diarrhea CT abdomen/pelvis 03/04/2021-no irregular annular wall thickening in the remnant rectum contiguous with presacral space, compatible with recurrent tumor.  Small rectovaginal fistula identified.  Small amount of gas and rectal contrast in the vagina and vaginal introitus.  Interval progression of metastatic adenopathy in the left perirectal, lower left pelvic sidewall and high presacral chains. Sunitinib 37.5 mg daily beginning 04/04/2021 Sunitinib dose reduced to 25 mg daily 05/06/2021 due to diarrhea, mild neutropenia (she began the reduced dose on 05/15/2021) Sutent placed on hold 05/19/2021 due to diarrhea, weight loss, failure to thrive Sunitinib resumed 25 mg daily 05/26/2021 Sunitinib discontinued 05/30/2021 secondary to persistent diarrhea CTs 06/15/2021-similar appearance of annular wall thickening with luminal narrowing in the remnant rectum.  Previously noted small rectovaginal fistula again identified with gas noted within the lumen of the vagina.  Mild increase in the size of left perirectal left pelvic sidewall soft tissue nodules.  Large lobulated mass within the presacral soft tissue region not significantly changed in the interval. Regorafenib 06/22/2021-discontinued 07/01/2021 secondary to diarrhea Regorafenib resumed at a dose of 40 mg daily  07/08/2021 Regorafenib cycle completed 08/30/2021 Regorafenib cycle started 09/08/2021 Remote history of pulmonary embolism-maintained on apixaban Multiple colon polyps noted on the colonoscopy 07/21/2014 with the pathology revealing tubular adenomas Hospitalization 06/21/2015 through 07/06/2015 with nausea/vomiting, high ileostomy output; found to have delayed gastric emptying. Water soluble contrast enema 08/13/2015 positive for a small to moderate volume of water soluble contrast leakage from the low anterior resection and anastomosis site in the pelvis Ileostomy takedown and resection of cystic abdominal wall mass ( benign pathology) on 01/28/2016 Anemia with ferritin in the low normal range 05/23/2016 and 06/20/2016. Ferrous sulfate increased to twice daily 06/26/2016. Improved.     Disposition: Brianna Walker has a metastatic gastrointestinal stromal tumor.  She has completed 3 cycles of regorafenib.  She continues to have diarrhea.  She had presyncope in the office today.  She may be dehydrated.  We will follow-up on the chemistry panel from today.  She will receive intravenous fluids.  We will repeat vital signs after the intravenous fluids.  She will hold regorafenib.  She will return for an office visit and reassessment in 1 week.  Betsy Coder, MD  09/29/2021  11:56 AM

## 2021-09-30 ENCOUNTER — Telehealth: Payer: Self-pay

## 2021-09-30 ENCOUNTER — Other Ambulatory Visit (HOSPITAL_COMMUNITY): Payer: Self-pay

## 2021-09-30 NOTE — Telephone Encounter (Signed)
TC to Pt who stated she is feeling much better today. Inquired about diarrhea and feeling dizzy. Pt stated after receiving fluids yesterday. The diarrhea has almost stopped and as well as the dizziness. Offered Pt to come in for additional fluids, Pt declined.

## 2021-09-30 NOTE — Telephone Encounter (Signed)
-----   Message from Ladell Pier, MD sent at 09/29/2021  6:11 PM EDT ----- Please call patient, please check on her in the morning 09/30/2021, asked her to return for additional IV fluids if she is still having diarrhea and feeling dizzy,

## 2021-10-03 ENCOUNTER — Other Ambulatory Visit: Payer: Self-pay | Admitting: Nurse Practitioner

## 2021-10-03 ENCOUNTER — Telehealth: Payer: Self-pay

## 2021-10-03 DIAGNOSIS — C49A4 Gastrointestinal stromal tumor of large intestine: Secondary | ICD-10-CM

## 2021-10-03 MED ORDER — OXYCODONE-ACETAMINOPHEN 5-325 MG PO TABS: 1.0000 | ORAL_TABLET | ORAL | 0 refills | Status: DC | PRN

## 2021-10-03 NOTE — Telephone Encounter (Signed)
Patient call in and requested a refill of her Oxycodone and lomotil, placed the requested on the Np's desk.

## 2021-10-06 ENCOUNTER — Encounter: Payer: Self-pay | Admitting: Nurse Practitioner

## 2021-10-06 ENCOUNTER — Inpatient Hospital Stay (HOSPITAL_BASED_OUTPATIENT_CLINIC_OR_DEPARTMENT_OTHER): Payer: Medicare HMO | Admitting: Nurse Practitioner

## 2021-10-06 ENCOUNTER — Other Ambulatory Visit: Payer: Self-pay

## 2021-10-06 ENCOUNTER — Inpatient Hospital Stay: Payer: Medicare HMO

## 2021-10-06 VITALS — BP 95/80 | HR 128 | Temp 98.1°F | Resp 18 | Wt 114.8 lb

## 2021-10-06 DIAGNOSIS — C49A4 Gastrointestinal stromal tumor of large intestine: Secondary | ICD-10-CM

## 2021-10-06 DIAGNOSIS — E860 Dehydration: Secondary | ICD-10-CM | POA: Diagnosis not present

## 2021-10-06 DIAGNOSIS — Z79891 Long term (current) use of opiate analgesic: Secondary | ICD-10-CM | POA: Diagnosis not present

## 2021-10-06 DIAGNOSIS — G893 Neoplasm related pain (acute) (chronic): Secondary | ICD-10-CM | POA: Diagnosis not present

## 2021-10-06 DIAGNOSIS — C49A5 Gastrointestinal stromal tumor of rectum: Secondary | ICD-10-CM | POA: Diagnosis not present

## 2021-10-06 DIAGNOSIS — Z7901 Long term (current) use of anticoagulants: Secondary | ICD-10-CM | POA: Diagnosis not present

## 2021-10-06 DIAGNOSIS — Z86711 Personal history of pulmonary embolism: Secondary | ICD-10-CM | POA: Diagnosis not present

## 2021-10-06 DIAGNOSIS — R55 Syncope and collapse: Secondary | ICD-10-CM | POA: Diagnosis not present

## 2021-10-06 DIAGNOSIS — R197 Diarrhea, unspecified: Secondary | ICD-10-CM | POA: Diagnosis not present

## 2021-10-06 LAB — CBC WITH DIFFERENTIAL (CANCER CENTER ONLY)
Abs Immature Granulocytes: 0.02 10*3/uL (ref 0.00–0.07)
Basophils Absolute: 0 10*3/uL (ref 0.0–0.1)
Basophils Relative: 0 %
Eosinophils Absolute: 0 10*3/uL (ref 0.0–0.5)
Eosinophils Relative: 0 %
HCT: 34.8 % — ABNORMAL LOW (ref 36.0–46.0)
Hemoglobin: 10.9 g/dL — ABNORMAL LOW (ref 12.0–15.0)
Immature Granulocytes: 0 %
Lymphocytes Relative: 21 %
Lymphs Abs: 2 10*3/uL (ref 0.7–4.0)
MCH: 27 pg (ref 26.0–34.0)
MCHC: 31.3 g/dL (ref 30.0–36.0)
MCV: 86.1 fL (ref 80.0–100.0)
Monocytes Absolute: 0.9 10*3/uL (ref 0.1–1.0)
Monocytes Relative: 9 %
Neutro Abs: 6.5 10*3/uL (ref 1.7–7.7)
Neutrophils Relative %: 70 %
Platelet Count: 503 10*3/uL — ABNORMAL HIGH (ref 150–400)
RBC: 4.04 MIL/uL (ref 3.87–5.11)
RDW: 15.9 % — ABNORMAL HIGH (ref 11.5–15.5)
WBC Count: 9.4 10*3/uL (ref 4.0–10.5)
nRBC: 0 % (ref 0.0–0.2)

## 2021-10-06 LAB — CMP (CANCER CENTER ONLY)
ALT: 6 U/L (ref 0–44)
AST: 12 U/L — ABNORMAL LOW (ref 15–41)
Albumin: 3.3 g/dL — ABNORMAL LOW (ref 3.5–5.0)
Alkaline Phosphatase: 94 U/L (ref 38–126)
Anion gap: 14 (ref 5–15)
BUN: 20 mg/dL (ref 8–23)
CO2: 20 mmol/L — ABNORMAL LOW (ref 22–32)
Calcium: 9.9 mg/dL (ref 8.9–10.3)
Chloride: 105 mmol/L (ref 98–111)
Creatinine: 1.18 mg/dL — ABNORMAL HIGH (ref 0.44–1.00)
GFR, Estimated: 49 mL/min — ABNORMAL LOW (ref >60)
Glucose, Bld: 113 mg/dL — ABNORMAL HIGH (ref 70–99)
Potassium: 3.7 mmol/L (ref 3.5–5.1)
Sodium: 139 mmol/L (ref 135–145)
Total Bilirubin: 0.6 mg/dL (ref 0.3–1.2)
Total Protein: 7 g/dL (ref 6.5–8.1)

## 2021-10-06 MED ORDER — OXYCODONE HCL 15 MG PO TABS: 15.0000 mg | ORAL_TABLET | ORAL | 0 refills | Status: DC | PRN

## 2021-10-06 MED ORDER — OXYCODONE HCL 5 MG PO TABS
10.0000 mg | ORAL_TABLET | Freq: Once | ORAL | Status: AC
  Administered 2021-10-06: 10 mg via ORAL
  Filled 2021-10-06: qty 2

## 2021-10-06 MED ORDER — SODIUM CHLORIDE 0.9 % IV SOLN: INTRAVENOUS | Status: DC

## 2021-10-06 NOTE — Progress Notes (Signed)
[facility] OFFICE PROGRESS NOTE   Diagnosis: Gastrointestinal stromal tumor  INTERVAL HISTORY:   Brianna Walker returns as scheduled.  She has completed 3 cycles of regorafenib.  Treatment placed on hold last week due to dehydration, need for IV fluids.  She continues to have diarrhea.  She feels fluid intake is adequate.  Pain is "rough".  The pain worsens with a bowel movement.  She is currently taking MS Contin 15 mg every 12 hours with Percocet 5/325 2 tablets every 4 hours.  Pain is not controlled.  Objective:  Vital signs in last 24 hours:  Blood pressure 95/80, pulse (!) 128, temperature 98.1 F (36.7 C), temperature source Tympanic, resp. rate 18, weight 114 lb 12.8 oz (52.1 kg), SpO2 100 %.    HEENT: No thrush or ulcers.  Mucous membranes are moist. Resp: Lungs clear bilaterally. Cardio: Regular rate and rhythm. GI: Abdomen soft and nontender.  No hepatosplenomegaly.  No mass.  Right abdominal hernia. Vascular: No leg edema.  Decreased skin turgor. Skin: No rash.   Lab Results:  Lab Results  Component Value Date   WBC 9.4 10/06/2021   HGB 10.9 (L) 10/06/2021   HCT 34.8 (L) 10/06/2021   MCV 86.1 10/06/2021   PLT 503 (H) 10/06/2021   NEUTROABS 6.5 10/06/2021    Imaging:  No results found.  Medications: I have reviewed the patient's current medications.  Assessment/Plan: Gastrointestinal stromal tumor of the rectum Status post an endoscopic ultrasound on 08/06/2014 confirming a mass abutting the distal rectal wall with an FNA biopsy confirming a gastrointestinal stromal tumor Initiation of Gleevec 09/04/2014 MRI pelvis 12/15/2014 with interval decreased size of the large anorectal mass with central necrosis. Continuation of Gleevec Gleevec placed on hold 06/02/2015 in anticipation of surgery 06/04/2015 status post low anterior resection and diverting loop ileostomy, resection of tumor Pathology: 9.5 cm tumor; GISTsubtype spindle; mitotic rate  1/50 high-power field; positive margin of resection; c-Kit mutation detected (Exon 13: c.1924A>G (p.K642E)) Adjuvant Gleevec 08/19/2015 (three-year in total course planned) Ileostomy takedown 01/28/2016 Held 01/28/2016 through 02/14/2016, resumed 02/15/2016 Gleevec stop 04/11/2016-04/18/2016, and 04/26/2016-05/08/2016 Gleevec resumed at a dose of 200 mg daily on 05/09/2016 Gleevec dose increased to 300 mg daily beginning 06/20/2016 Gleevec dose decreased to 200 mg daily beginning 06/23/2016 (poor tolerance at 300 mg dose) CTs 11/22/2017- enhancing soft tissue mass in the presacral space measuring 5.4 x 3.5 cm.  Enlarged pelvic lymph nodes. Gleevec continued at a dose of 200 mg daily Restaging CTs 03/11/2018- interval decrease in size of the presacral soft tissue mass.  Stable small pelvic lymph nodes. Gleevec discontinued 03/13/2018 CT 11/14/2018- new 2.4 cm left pelvic nodule at the piriformis muscle, stable presacral lymph nodes, right abdominal wall hernia, ventral hernia Gleevec 200 mg daily 12/04/2018 CTs 03/31/2019 -decrease in size of soft tissue nodule deep left pelvis.  5 nodules in the sigmoid mesocolon stable to slightly larger.  Stable presacral soft tissue thickening.  No findings for new metastatic disease. CT abdomen/pelvis 09/01/2019-progressive presacral and left perirectal nodularity.  Additional mesenteric nodules along the sigmoid mesocolon and left pelvis unchanged. Gleevec dose escalated to 300 mg daily 09/02/2019 Gleevec dose escalated to 400 mg alternating with 300 mg beginning 09/16/2019 Gleevec 300 mg daily beginning 09/30/2019 (unable to tolerate 400 mg due to diarrhea) CT 12/15/2019-pelvic/peritoneal nodularity-unchanged CT 06/02/2020-mildly worsened soft tissue density between the lower rectum and the sacrum. Mildly worsened superior anorectal adenopathy and perirectal adenopathy.  Office review of CT consistent with stable disease Gleevec 300  mg daily continued Gleevec dose  reduced to 200 mg daily beginning 01/07/2021 (diarrhea) Gleevec placed on hold 01/27/2021 due to diarrhea CT abdomen/pelvis 03/04/2021-no irregular annular wall thickening in the remnant rectum contiguous with presacral space, compatible with recurrent tumor.  Small rectovaginal fistula identified.  Small amount of gas and rectal contrast in the vagina and vaginal introitus.  Interval progression of metastatic adenopathy in the left perirectal, lower left pelvic sidewall and high presacral chains. Sunitinib 37.5 mg daily beginning 04/04/2021 Sunitinib dose reduced to 25 mg daily 05/06/2021 due to diarrhea, mild neutropenia (she began the reduced dose on 05/15/2021) Sutent placed on hold 05/19/2021 due to diarrhea, weight loss, failure to thrive Sunitinib resumed 25 mg daily 05/26/2021 Sunitinib discontinued 05/30/2021 secondary to persistent diarrhea CTs 06/15/2021-similar appearance of annular wall thickening with luminal narrowing in the remnant rectum.  Previously noted small rectovaginal fistula again identified with gas noted within the lumen of the vagina.  Mild increase in the size of left perirectal left pelvic sidewall soft tissue nodules.  Large lobulated mass within the presacral soft tissue region not significantly changed in the interval. Regorafenib 06/22/2021-discontinued 07/01/2021 secondary to diarrhea Regorafenib resumed at a dose of 40 mg daily 07/08/2021 Regorafenib cycle completed 08/30/2021 Regorafenib cycle started 09/08/2021 Remote history of pulmonary embolism-maintained on apixaban Multiple colon polyps noted on the colonoscopy 07/21/2014 with the pathology revealing tubular adenomas Hospitalization 06/21/2015 through 07/06/2015 with nausea/vomiting, high ileostomy output; found to have delayed gastric emptying. Water soluble contrast enema 08/13/2015 positive for a small to moderate volume of water soluble contrast leakage from the low anterior resection and anastomosis site in the  pelvis Ileostomy takedown and resection of cystic abdominal wall mass ( benign pathology) on 01/28/2016 Anemia with ferritin in the low normal range 05/23/2016 and 06/20/2016. Ferrous sulfate increased to twice daily 06/26/2016. Improved.      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  She has completed 3 cycles of Regorafenib.  She continues to have significant pain.  She will continue to hold regorafenib.  We are referring her for restaging CT scans.  Pain is poorly controlled.  She will increase MS Contin from 15 mg twice daily to 30 mg twice daily.  For breakthrough pain she will take oxycodone 15 mg every 4 hours as needed.  She continues to have diarrhea and again appears dehydrated.  She will receive IV fluids today.  She will return for follow-up in 2 weeks.    Ned Card ANP/GNP-BC   10/07/2021  8:27 AM

## 2021-10-13 ENCOUNTER — Ambulatory Visit (HOSPITAL_BASED_OUTPATIENT_CLINIC_OR_DEPARTMENT_OTHER)
Admission: RE | Admit: 2021-10-13 | Discharge: 2021-10-13 | Disposition: A | Discharge status: Home / Self Care | Payer: Medicare HMO | Source: Ambulatory Visit | Attending: Nurse Practitioner | Admitting: Nurse Practitioner

## 2021-10-13 DIAGNOSIS — C49A4 Gastrointestinal stromal tumor of large intestine: Secondary | ICD-10-CM | POA: Diagnosis not present

## 2021-10-13 DIAGNOSIS — N823 Fistula of vagina to large intestine: Secondary | ICD-10-CM | POA: Diagnosis not present

## 2021-10-13 DIAGNOSIS — K439 Ventral hernia without obstruction or gangrene: Secondary | ICD-10-CM | POA: Diagnosis not present

## 2021-10-14 ENCOUNTER — Telehealth: Payer: Self-pay

## 2021-10-14 ENCOUNTER — Other Ambulatory Visit: Payer: Self-pay | Admitting: Nurse Practitioner

## 2021-10-14 ENCOUNTER — Other Ambulatory Visit (HOSPITAL_BASED_OUTPATIENT_CLINIC_OR_DEPARTMENT_OTHER): Payer: Self-pay

## 2021-10-14 DIAGNOSIS — C49A4 Gastrointestinal stromal tumor of large intestine: Secondary | ICD-10-CM

## 2021-10-14 MED ORDER — OXYCODONE HCL 5 MG PO TABS
5.0000 mg | ORAL_TABLET | ORAL | 0 refills | Status: DC | PRN
  Filled 2021-10-14: qty 60, 5d supply, fill #0

## 2021-10-14 MED ORDER — DIPHENOXYLATE-ATROPINE 2.5-0.025 MG PO TABS: ORAL_TABLET | ORAL | 0 refills | Status: DC

## 2021-10-14 NOTE — Telephone Encounter (Signed)
Patient called in requested a refill of her pain medication and her Lomotil, the requested was placed on the Np's desk.

## 2021-10-18 ENCOUNTER — Inpatient Hospital Stay: Payer: Medicare HMO

## 2021-10-18 ENCOUNTER — Other Ambulatory Visit (HOSPITAL_BASED_OUTPATIENT_CLINIC_OR_DEPARTMENT_OTHER): Payer: Self-pay

## 2021-10-18 ENCOUNTER — Other Ambulatory Visit: Payer: Self-pay

## 2021-10-18 ENCOUNTER — Inpatient Hospital Stay (HOSPITAL_BASED_OUTPATIENT_CLINIC_OR_DEPARTMENT_OTHER): Payer: Medicare HMO | Admitting: Nurse Practitioner

## 2021-10-18 ENCOUNTER — Encounter: Payer: Self-pay | Admitting: Nurse Practitioner

## 2021-10-18 VITALS — BP 114/70 | HR 100 | Temp 98.1°F | Resp 18 | Ht 64.0 in | Wt 113.8 lb

## 2021-10-18 DIAGNOSIS — R197 Diarrhea, unspecified: Secondary | ICD-10-CM | POA: Diagnosis not present

## 2021-10-18 DIAGNOSIS — G893 Neoplasm related pain (acute) (chronic): Secondary | ICD-10-CM | POA: Diagnosis not present

## 2021-10-18 DIAGNOSIS — C49A4 Gastrointestinal stromal tumor of large intestine: Secondary | ICD-10-CM

## 2021-10-18 DIAGNOSIS — Z7901 Long term (current) use of anticoagulants: Secondary | ICD-10-CM | POA: Diagnosis not present

## 2021-10-18 DIAGNOSIS — Z86711 Personal history of pulmonary embolism: Secondary | ICD-10-CM | POA: Diagnosis not present

## 2021-10-18 DIAGNOSIS — E860 Dehydration: Secondary | ICD-10-CM | POA: Diagnosis not present

## 2021-10-18 DIAGNOSIS — Z79891 Long term (current) use of opiate analgesic: Secondary | ICD-10-CM | POA: Diagnosis not present

## 2021-10-18 DIAGNOSIS — C49A5 Gastrointestinal stromal tumor of rectum: Secondary | ICD-10-CM | POA: Diagnosis not present

## 2021-10-18 DIAGNOSIS — R55 Syncope and collapse: Secondary | ICD-10-CM | POA: Diagnosis not present

## 2021-10-18 LAB — CBC WITH DIFFERENTIAL (CANCER CENTER ONLY)
Abs Immature Granulocytes: 0.01 10*3/uL (ref 0.00–0.07)
Basophils Absolute: 0 10*3/uL (ref 0.0–0.1)
Basophils Relative: 0 %
Eosinophils Absolute: 0 10*3/uL (ref 0.0–0.5)
Eosinophils Relative: 0 %
HCT: 31.7 % — ABNORMAL LOW (ref 36.0–46.0)
Hemoglobin: 9.9 g/dL — ABNORMAL LOW (ref 12.0–15.0)
Immature Granulocytes: 0 %
Lymphocytes Relative: 19 %
Lymphs Abs: 1.7 10*3/uL (ref 0.7–4.0)
MCH: 27.1 pg (ref 26.0–34.0)
MCHC: 31.2 g/dL (ref 30.0–36.0)
MCV: 86.8 fL (ref 80.0–100.0)
Monocytes Absolute: 0.8 10*3/uL (ref 0.1–1.0)
Monocytes Relative: 9 %
Neutro Abs: 6.4 10*3/uL (ref 1.7–7.7)
Neutrophils Relative %: 72 %
Platelet Count: 495 10*3/uL — ABNORMAL HIGH (ref 150–400)
RBC: 3.65 MIL/uL — ABNORMAL LOW (ref 3.87–5.11)
RDW: 15.8 % — ABNORMAL HIGH (ref 11.5–15.5)
WBC Count: 9 10*3/uL (ref 4.0–10.5)
nRBC: 0 % (ref 0.0–0.2)

## 2021-10-18 LAB — CMP (CANCER CENTER ONLY)
ALT: 8 U/L (ref 0–44)
AST: 13 U/L — ABNORMAL LOW (ref 15–41)
Albumin: 3 g/dL — ABNORMAL LOW (ref 3.5–5.0)
Alkaline Phosphatase: 98 U/L (ref 38–126)
Anion gap: 10 (ref 5–15)
BUN: 14 mg/dL (ref 8–23)
CO2: 24 mmol/L (ref 22–32)
Calcium: 9.4 mg/dL (ref 8.9–10.3)
Chloride: 105 mmol/L (ref 98–111)
Creatinine: 1.13 mg/dL — ABNORMAL HIGH (ref 0.44–1.00)
GFR, Estimated: 51 mL/min — ABNORMAL LOW (ref >60)
Glucose, Bld: 99 mg/dL (ref 70–99)
Potassium: 4 mmol/L (ref 3.5–5.1)
Sodium: 139 mmol/L (ref 135–145)
Total Bilirubin: 0.6 mg/dL (ref 0.3–1.2)
Total Protein: 6.7 g/dL (ref 6.5–8.1)

## 2021-10-18 MED ORDER — RIPRETINIB 50 MG PO TABS
150.0000 mg | ORAL_TABLET | Freq: Every day | ORAL | 0 refills | Status: DC
  Filled 2021-10-18: qty 90, 30d supply, fill #0

## 2021-10-18 MED ORDER — MORPHINE SULFATE ER 30 MG PO TBCR
30.0000 mg | EXTENDED_RELEASE_TABLET | Freq: Two times a day (BID) | ORAL | 0 refills | Status: DC
  Filled 2021-10-18: qty 60, 30d supply, fill #0

## 2021-10-18 NOTE — Progress Notes (Signed)
[facility] OFFICE PROGRESS NOTE   Diagnosis: Gastrointestinal stromal tumor  INTERVAL HISTORY:   Ms. Detzel returns as scheduled.  Regorafenib is on hold.  She continues to have significant pain at the rectum.  She continues MS Contin 15 mg every 12 hours with oxycodone for breakthrough pain.  Pain is poorly controlled.  She continues to have diarrhea.  Objective:  Vital signs in last 24 hours:  Blood pressure 114/70, pulse 100, temperature 98.1 F (36.7 C), temperature source Oral, resp. rate 18, height '5\' 4"'  (1.626 m), weight 113 lb 12.8 oz (51.6 kg), SpO2 100 %.    Resp: Lungs clear bilaterally. Cardio: Regular rate and rhythm. GI: Abdomen soft and nontender.  No hepatomegaly.  No mass. Vascular: No leg edema.   Lab Results:  Lab Results  Component Value Date   WBC 9.0 10/18/2021   HGB 9.9 (L) 10/18/2021   HCT 31.7 (L) 10/18/2021   MCV 86.8 10/18/2021   PLT 495 (H) 10/18/2021   NEUTROABS 6.4 10/18/2021    Imaging:  No results found.  Medications: I have reviewed the patient's current medications.  Assessment/Plan: Gastrointestinal stromal tumor of the rectum Status post an endoscopic ultrasound on 08/06/2014 confirming a mass abutting the distal rectal wall with an FNA biopsy confirming a gastrointestinal stromal tumor Initiation of Gleevec 09/04/2014 MRI pelvis 12/15/2014 with interval decreased size of the large anorectal mass with central necrosis. Continuation of Gleevec Gleevec placed on hold 06/02/2015 in anticipation of surgery 06/04/2015 status post low anterior resection and diverting loop ileostomy, resection of tumor Pathology: 9.5 cm tumor; GISTsubtype spindle; mitotic rate 1/50 high-power field; positive margin of resection; c-Kit mutation detected (Exon 13: c.1924A>G (p.K642E)) Adjuvant Gleevec 08/19/2015 (three-year in total course planned) Ileostomy takedown 01/28/2016 Held 01/28/2016 through 02/14/2016, resumed  02/15/2016 Gleevec stop 04/11/2016-04/18/2016, and 04/26/2016-05/08/2016 Gleevec resumed at a dose of 200 mg daily on 05/09/2016 Gleevec dose increased to 300 mg daily beginning 06/20/2016 Gleevec dose decreased to 200 mg daily beginning 06/23/2016 (poor tolerance at 300 mg dose) CTs 11/22/2017- enhancing soft tissue mass in the presacral space measuring 5.4 x 3.5 cm.  Enlarged pelvic lymph nodes. Gleevec continued at a dose of 200 mg daily Restaging CTs 03/11/2018- interval decrease in size of the presacral soft tissue mass.  Stable small pelvic lymph nodes. Gleevec discontinued 03/13/2018 CT 11/14/2018- new 2.4 cm left pelvic nodule at the piriformis muscle, stable presacral lymph nodes, right abdominal wall hernia, ventral hernia Gleevec 200 mg daily 12/04/2018 CTs 03/31/2019 -decrease in size of soft tissue nodule deep left pelvis.  5 nodules in the sigmoid mesocolon stable to slightly larger.  Stable presacral soft tissue thickening.  No findings for new metastatic disease. CT abdomen/pelvis 09/01/2019-progressive presacral and left perirectal nodularity.  Additional mesenteric nodules along the sigmoid mesocolon and left pelvis unchanged. Gleevec dose escalated to 300 mg daily 09/02/2019 Gleevec dose escalated to 400 mg alternating with 300 mg beginning 09/16/2019 Gleevec 300 mg daily beginning 09/30/2019 (unable to tolerate 400 mg due to diarrhea) CT 12/15/2019-pelvic/peritoneal nodularity-unchanged CT 06/02/2020-mildly worsened soft tissue density between the lower rectum and the sacrum. Mildly worsened superior anorectal adenopathy and perirectal adenopathy.  Office review of CT consistent with stable disease Gleevec 300 mg daily continued Gleevec dose reduced to 200 mg daily beginning 01/07/2021 (diarrhea) Gleevec placed on hold 01/27/2021 due to diarrhea CT abdomen/pelvis 03/04/2021-no irregular annular wall thickening in the remnant rectum contiguous with presacral space, compatible with recurrent  tumor.  Small rectovaginal fistula identified.  Small  amount of gas and rectal contrast in the vagina and vaginal introitus.  Interval progression of metastatic adenopathy in the left perirectal, lower left pelvic sidewall and high presacral chains. Sunitinib 37.5 mg daily beginning 04/04/2021 Sunitinib dose reduced to 25 mg daily 05/06/2021 due to diarrhea, mild neutropenia (she began the reduced dose on 05/15/2021) Sutent placed on hold 05/19/2021 due to diarrhea, weight loss, failure to thrive Sunitinib resumed 25 mg daily 05/26/2021 Sunitinib discontinued 05/30/2021 secondary to persistent diarrhea CTs 06/15/2021-similar appearance of annular wall thickening with luminal narrowing in the remnant rectum.  Previously noted small rectovaginal fistula again identified with gas noted within the lumen of the vagina.  Mild increase in the size of left perirectal left pelvic sidewall soft tissue nodules.  Large lobulated mass within the presacral soft tissue region not significantly changed in the interval. Regorafenib 06/22/2021-discontinued 07/01/2021 secondary to diarrhea Regorafenib resumed at a dose of 40 mg daily 07/08/2021 Regorafenib cycle completed 08/30/2021 Regorafenib cycle started 09/08/2021 Regorafenib placed on hold 09/29/2021 CTs 10/13/2021-increase size of left perianal, left perirectal and presacral soft tissue masses Ripretinib 10/24/2021 Remote history of pulmonary embolism-maintained on apixaban Multiple colon polyps noted on the colonoscopy 07/21/2014 with the pathology revealing tubular adenomas Hospitalization 06/21/2015 through 07/06/2015 with nausea/vomiting, high ileostomy output; found to have delayed gastric emptying. Water soluble contrast enema 08/13/2015 positive for a small to moderate volume of water soluble contrast leakage from the low anterior resection and anastomosis site in the pelvis Ileostomy takedown and resection of cystic abdominal wall mass ( benign pathology) on  01/28/2016 Anemia with ferritin in the low normal range 05/23/2016 and 06/20/2016. Ferrous sulfate increased to twice daily 06/26/2016. Improved.      Disposition: Ms. Caratachea appears unchanged.  The recent restaging CT scans show evidence of disease progression.  Results/images reviewed with Ms. Irmen and her sister.  We discussed options to include supportive/comfort care with a hospice referral versus salvage therapy with ripretinib.  We discussed potential toxicities associated with ripretinib including arthralgias/myalgias, hypertension, heart failure, PPE, delayed wound healing, diarrhea, nausea, hair loss.  She agrees to proceed.  We are referring her for a baseline 2D echo.  We adjusted her pain medication, increasing MS Contin to 30 mg every 12 hours, continuation of Oxy IR.  She will return for lab and follow-up in approximately 3 weeks.  We are available to see her sooner if needed.  Patient seen with Dr. Benay Spice.    Ned Card ANP/GNP-BC   10/18/2021  3:33 PM  This was a shared visit with Ned Card.  Ms. Weinheimer was interviewed and examined.  We reviewed the CT findings with her.  I reviewed the CT images.  There is clinical and radiologic evidence of disease progression.  Regorafenib will be discontinued.  We discussed comfort care versus a trial of salvage therapy with ripretinib.  She would like to proceed with ripretinib therapy.  We reviewed potential toxicities associated with ripretinib. The plan is to begin treatment within the next week.  We adjusted the narcotic pain regimen today.  I was present for greater than 50% of today's visit.  I performed medical decision making.  Julieanne Manson, MD

## 2021-10-19 ENCOUNTER — Other Ambulatory Visit (HOSPITAL_BASED_OUTPATIENT_CLINIC_OR_DEPARTMENT_OTHER): Payer: Self-pay

## 2021-10-19 ENCOUNTER — Other Ambulatory Visit (HOSPITAL_COMMUNITY): Payer: Self-pay

## 2021-10-19 ENCOUNTER — Telehealth: Payer: Self-pay

## 2021-10-19 ENCOUNTER — Telehealth: Payer: Self-pay | Admitting: Pharmacy Technician

## 2021-10-19 NOTE — Telephone Encounter (Signed)
[name] called and wanted to know if her Brianna Walker is ready. I called the pharmacy to check on the medication and the medication is ready for pick up. I let the patient know its ready for pick up. Patient gave verb understanding.

## 2021-10-19 NOTE — Telephone Encounter (Signed)
Oral Oncology Patient Advocate Encounter  Prior Authorization for Daneen Schick has been approved.    PA# [id number] Effective dates: 10/19/21 through 04/20/22  Patients co-pay is $0.00  Oral Oncology Clinic will continue to follow.   [place] Patient [place] Phone [phone] Fax [phone] 10/19/2021 12:24 PM

## 2021-10-19 NOTE — Telephone Encounter (Signed)
Oral Oncology Patient Advocate Encounter   Received notification from [facility] that prior authorization for Qinlock is required.   PA submitted on CoverMyMeds Key [id number]  Status is pending   Oral Oncology Clinic will continue to follow.  [place] Patient [place] Phone [phone] Fax [phone] 10/19/2021 11:06 AM

## 2021-10-20 ENCOUNTER — Telehealth: Payer: Self-pay | Admitting: Pharmacist

## 2021-10-20 DIAGNOSIS — C49A4 Gastrointestinal stromal tumor of large intestine: Secondary | ICD-10-CM

## 2021-10-20 MED ORDER — RIPRETINIB 50 MG PO TABS: 150.0000 mg | ORAL_TABLET | Freq: Every day | ORAL | 0 refills | Status: DC

## 2021-10-20 NOTE — Telephone Encounter (Signed)
Oral Chemotherapy Pharmacy Student Encounter   Called patient to provide medication education on new oral chemotherapy Qinlock (ripretinib). Patient was contacted by [place] today who provided education. Patient declined further education and had no questions regarding medication.  Education handout and BP log has been mailed to patient.  Patient confirmed medication will arrive 10/21/21 and will start therapy 10/22/21.  Garrel Ridgel, PharmD Candidate [place] [facility] [place]/DB/AP Oral [facility] [phone]  10/20/2021 3:59 PM

## 2021-10-21 ENCOUNTER — Other Ambulatory Visit (HOSPITAL_BASED_OUTPATIENT_CLINIC_OR_DEPARTMENT_OTHER): Payer: Self-pay

## 2021-10-24 ENCOUNTER — Telehealth: Payer: Self-pay

## 2021-10-24 ENCOUNTER — Other Ambulatory Visit: Payer: Self-pay | Admitting: Nurse Practitioner

## 2021-10-24 ENCOUNTER — Other Ambulatory Visit (HOSPITAL_BASED_OUTPATIENT_CLINIC_OR_DEPARTMENT_OTHER): Payer: Self-pay

## 2021-10-24 ENCOUNTER — Other Ambulatory Visit: Payer: Self-pay

## 2021-10-24 DIAGNOSIS — C49A4 Gastrointestinal stromal tumor of large intestine: Secondary | ICD-10-CM

## 2021-10-24 MED ORDER — OXYCODONE HCL 5 MG PO TABS
5.0000 mg | ORAL_TABLET | ORAL | 0 refills | Status: DC | PRN
  Filled 2021-10-24: qty 60, 5d supply, fill #0

## 2021-10-24 NOTE — Telephone Encounter (Signed)
Patient called in and requested a refill of her Oxycodone, the requested was placed on the Np's desk.

## 2021-10-25 ENCOUNTER — Other Ambulatory Visit (HOSPITAL_BASED_OUTPATIENT_CLINIC_OR_DEPARTMENT_OTHER): Payer: Self-pay

## 2021-10-26 ENCOUNTER — Inpatient Hospital Stay (HOSPITAL_COMMUNITY): Admission: RE | Admit: 2021-10-26 | Payer: Medicare HMO | Source: Ambulatory Visit

## 2021-10-26 ENCOUNTER — Encounter (HOSPITAL_COMMUNITY): Payer: Self-pay

## 2021-10-28 ENCOUNTER — Other Ambulatory Visit: Payer: Self-pay | Admitting: Nurse Practitioner

## 2021-10-28 ENCOUNTER — Other Ambulatory Visit (HOSPITAL_BASED_OUTPATIENT_CLINIC_OR_DEPARTMENT_OTHER): Payer: Self-pay

## 2021-10-28 DIAGNOSIS — C49A4 Gastrointestinal stromal tumor of large intestine: Secondary | ICD-10-CM

## 2021-11-02 ENCOUNTER — Other Ambulatory Visit: Payer: Self-pay | Admitting: Nurse Practitioner

## 2021-11-02 ENCOUNTER — Telehealth: Payer: Self-pay

## 2021-11-02 ENCOUNTER — Other Ambulatory Visit (HOSPITAL_BASED_OUTPATIENT_CLINIC_OR_DEPARTMENT_OTHER): Payer: Self-pay

## 2021-11-02 DIAGNOSIS — C49A4 Gastrointestinal stromal tumor of large intestine: Secondary | ICD-10-CM

## 2021-11-02 MED ORDER — MORPHINE SULFATE ER 30 MG PO TBCR
30.0000 mg | EXTENDED_RELEASE_TABLET | Freq: Two times a day (BID) | ORAL | 0 refills | Status: DC
  Filled 2021-11-02 – 2021-11-18 (×2): qty 60, 30d supply, fill #0

## 2021-11-02 MED ORDER — OXYCODONE HCL 5 MG PO TABS
5.0000 mg | ORAL_TABLET | ORAL | 0 refills | Status: DC | PRN
  Filled 2021-11-02: qty 60, 5d supply, fill #0

## 2021-11-02 NOTE — Telephone Encounter (Signed)
Patient requested a refill of her pain medication  oxy. 5 mg and MS 30 mg, the requests was placed on the Np's desk

## 2021-11-03 ENCOUNTER — Other Ambulatory Visit (HOSPITAL_BASED_OUTPATIENT_CLINIC_OR_DEPARTMENT_OTHER): Payer: Self-pay

## 2021-11-04 ENCOUNTER — Other Ambulatory Visit (HOSPITAL_BASED_OUTPATIENT_CLINIC_OR_DEPARTMENT_OTHER): Payer: Self-pay

## 2021-11-04 ENCOUNTER — Other Ambulatory Visit: Payer: Self-pay

## 2021-11-04 DIAGNOSIS — C49A4 Gastrointestinal stromal tumor of large intestine: Secondary | ICD-10-CM

## 2021-11-04 MED ORDER — PREDNISONE 10 MG PO TABS
10.0000 mg | ORAL_TABLET | Freq: Every day | ORAL | 2 refills | Status: DC
  Filled 2021-11-04: qty 30, 30d supply, fill #0
  Filled 2021-11-24 – 2021-11-28 (×2): qty 30, 30d supply, fill #1

## 2021-11-07 ENCOUNTER — Other Ambulatory Visit: Payer: Self-pay | Admitting: *Deleted

## 2021-11-07 ENCOUNTER — Inpatient Hospital Stay (HOSPITAL_BASED_OUTPATIENT_CLINIC_OR_DEPARTMENT_OTHER): Payer: Medicare HMO | Admitting: Oncology

## 2021-11-07 ENCOUNTER — Other Ambulatory Visit (HOSPITAL_BASED_OUTPATIENT_CLINIC_OR_DEPARTMENT_OTHER): Payer: Self-pay

## 2021-11-07 ENCOUNTER — Inpatient Hospital Stay: Payer: Medicare HMO | Attending: Oncology

## 2021-11-07 VITALS — BP 109/72 | HR 99 | Temp 98.2°F | Resp 18 | Ht 64.0 in | Wt 109.0 lb

## 2021-11-07 DIAGNOSIS — G893 Neoplasm related pain (acute) (chronic): Secondary | ICD-10-CM | POA: Diagnosis not present

## 2021-11-07 DIAGNOSIS — C49A4 Gastrointestinal stromal tumor of large intestine: Secondary | ICD-10-CM

## 2021-11-07 DIAGNOSIS — Z86711 Personal history of pulmonary embolism: Secondary | ICD-10-CM | POA: Insufficient documentation

## 2021-11-07 DIAGNOSIS — R55 Syncope and collapse: Secondary | ICD-10-CM | POA: Diagnosis not present

## 2021-11-07 DIAGNOSIS — R197 Diarrhea, unspecified: Secondary | ICD-10-CM | POA: Diagnosis not present

## 2021-11-07 DIAGNOSIS — E860 Dehydration: Secondary | ICD-10-CM | POA: Diagnosis not present

## 2021-11-07 DIAGNOSIS — Z79891 Long term (current) use of opiate analgesic: Secondary | ICD-10-CM | POA: Insufficient documentation

## 2021-11-07 DIAGNOSIS — C49A5 Gastrointestinal stromal tumor of rectum: Secondary | ICD-10-CM | POA: Insufficient documentation

## 2021-11-07 DIAGNOSIS — D649 Anemia, unspecified: Secondary | ICD-10-CM | POA: Insufficient documentation

## 2021-11-07 DIAGNOSIS — Z7901 Long term (current) use of anticoagulants: Secondary | ICD-10-CM | POA: Diagnosis not present

## 2021-11-07 LAB — CMP (CANCER CENTER ONLY)
ALT: 13 U/L (ref 0–44)
AST: 18 U/L (ref 15–41)
Albumin: 2.9 g/dL — ABNORMAL LOW (ref 3.5–5.0)
Alkaline Phosphatase: 91 U/L (ref 38–126)
Anion gap: 13 (ref 5–15)
BUN: 14 mg/dL (ref 8–23)
CO2: 26 mmol/L (ref 22–32)
Calcium: 9.2 mg/dL (ref 8.9–10.3)
Chloride: 106 mmol/L (ref 98–111)
Creatinine: 0.96 mg/dL (ref 0.44–1.00)
GFR, Estimated: >60 mL/min (ref >60)
Glucose, Bld: 90 mg/dL (ref 70–99)
Potassium: 3.9 mmol/L (ref 3.5–5.1)
Sodium: 145 mmol/L (ref 135–145)
Total Bilirubin: 1.1 mg/dL (ref 0.3–1.2)
Total Protein: 6.7 g/dL (ref 6.5–8.1)

## 2021-11-07 LAB — CBC WITH DIFFERENTIAL (CANCER CENTER ONLY)
Abs Immature Granulocytes: 0.16 10*3/uL — ABNORMAL HIGH (ref 0.00–0.07)
Basophils Absolute: 0 10*3/uL (ref 0.0–0.1)
Basophils Relative: 0 %
Eosinophils Absolute: 0 10*3/uL (ref 0.0–0.5)
Eosinophils Relative: 0 %
HCT: 36.3 % (ref 36.0–46.0)
Hemoglobin: 11.3 g/dL — ABNORMAL LOW (ref 12.0–15.0)
Immature Granulocytes: 2 %
Lymphocytes Relative: 21 %
Lymphs Abs: 2.2 10*3/uL (ref 0.7–4.0)
MCH: 27.1 pg (ref 26.0–34.0)
MCHC: 31.1 g/dL (ref 30.0–36.0)
MCV: 87.1 fL (ref 80.0–100.0)
Monocytes Absolute: 0.9 10*3/uL (ref 0.1–1.0)
Monocytes Relative: 9 %
Neutro Abs: 7.2 10*3/uL (ref 1.7–7.7)
Neutrophils Relative %: 68 %
Platelet Count: 415 10*3/uL — ABNORMAL HIGH (ref 150–400)
RBC: 4.17 MIL/uL (ref 3.87–5.11)
RDW: 15.8 % — ABNORMAL HIGH (ref 11.5–15.5)
WBC Count: 10.5 10*3/uL (ref 4.0–10.5)
nRBC: 0 % (ref 0.0–0.2)

## 2021-11-07 MED ORDER — RIPRETINIB 50 MG PO TABS: 150.0000 mg | ORAL_TABLET | Freq: Every day | ORAL | 0 refills | Status: DC

## 2021-11-07 NOTE — Progress Notes (Signed)
[facility] OFFICE PROGRESS NOTE   Diagnosis: Gastrointestinal stromal tumor  INTERVAL HISTORY:   Brianna Walker returns as scheduled.  She started ripretinib on 10/22/2021.  No rash, diarrhea, bleeding, or symptom of thrombosis.  She has noted decreased diarrhea and improvement in rectal pain.  She continues MS Contin and as needed oxycodone.  Objective:  Vital signs in last 24 hours:  Blood pressure 109/72, pulse 99, temperature 98.2 F (36.8 C), temperature source Oral, resp. rate 18, height '5\' 4"'  (1.626 m), weight 109 lb (49.4 kg), SpO2 100 %.    HEENT: No thrush or ulcers Resp: Lungs clear bilaterally Cardio: Regular rate and rhythm GI: Right abdomen hernia, no hepatosplenomegaly, nontender Vascular: No leg edema  Lab Results:  Lab Results  Component Value Date   WBC 10.5 11/07/2021   HGB 11.3 (L) 11/07/2021   HCT 36.3 11/07/2021   MCV 87.1 11/07/2021   PLT 415 (H) 11/07/2021   NEUTROABS 7.2 11/07/2021    CMP  Lab Results  Component Value Date   NA 145 11/07/2021   K 3.9 11/07/2021   CL 106 11/07/2021   CO2 26 11/07/2021   GLUCOSE 90 11/07/2021   BUN 14 11/07/2021   CREATININE 0.96 11/07/2021   CALCIUM 9.2 11/07/2021   PROT 6.7 11/07/2021   ALBUMIN 2.9 (L) 11/07/2021   AST 18 11/07/2021   ALT 13 11/07/2021   ALKPHOS 91 11/07/2021   BILITOT 1.1 11/07/2021   GFRNONAA >60 11/07/2021   GFRAA >60 12/05/2019    Medications: I have reviewed the patient's current medications.   Assessment/Plan: Gastrointestinal stromal tumor of the rectum Status post an endoscopic ultrasound on 08/06/2014 confirming a mass abutting the distal rectal wall with an FNA biopsy confirming a gastrointestinal stromal tumor Initiation of Gleevec 09/04/2014 MRI pelvis 12/15/2014 with interval decreased size of the large anorectal mass with central necrosis. Continuation of Gleevec Gleevec placed on hold 06/02/2015 in anticipation of surgery 06/04/2015 status post  low anterior resection and diverting loop ileostomy, resection of tumor Pathology: 9.5 cm tumor; GISTsubtype spindle; mitotic rate 1/50 high-power field; positive margin of resection; c-Kit mutation detected (Exon 13: c.1924A>G (p.K642E)) Adjuvant Gleevec 08/19/2015 (three-year in total course planned) Ileostomy takedown 01/28/2016 Held 01/28/2016 through 02/14/2016, resumed 02/15/2016 Gleevec stop 04/11/2016-04/18/2016, and 04/26/2016-05/08/2016 Gleevec resumed at a dose of 200 mg daily on 05/09/2016 Gleevec dose increased to 300 mg daily beginning 06/20/2016 Gleevec dose decreased to 200 mg daily beginning 06/23/2016 (poor tolerance at 300 mg dose) CTs 11/22/2017- enhancing soft tissue mass in the presacral space measuring 5.4 x 3.5 cm.  Enlarged pelvic lymph nodes. Gleevec continued at a dose of 200 mg daily Restaging CTs 03/11/2018- interval decrease in size of the presacral soft tissue mass.  Stable small pelvic lymph nodes. Gleevec discontinued 03/13/2018 CT 11/14/2018- new 2.4 cm left pelvic nodule at the piriformis muscle, stable presacral lymph nodes, right abdominal wall hernia, ventral hernia Gleevec 200 mg daily 12/04/2018 CTs 03/31/2019 -decrease in size of soft tissue nodule deep left pelvis.  5 nodules in the sigmoid mesocolon stable to slightly larger.  Stable presacral soft tissue thickening.  No findings for new metastatic disease. CT abdomen/pelvis 09/01/2019-progressive presacral and left perirectal nodularity.  Additional mesenteric nodules along the sigmoid mesocolon and left pelvis unchanged. Gleevec dose escalated to 300 mg daily 09/02/2019 Gleevec dose escalated to 400 mg alternating with 300 mg beginning 09/16/2019 Gleevec 300 mg daily beginning 09/30/2019 (unable to tolerate 400 mg due to diarrhea) CT 12/15/2019-pelvic/peritoneal nodularity-unchanged CT 06/02/2020-mildly  worsened soft tissue density between the lower rectum and the sacrum. Mildly worsened superior anorectal  adenopathy and perirectal adenopathy.  Office review of CT consistent with stable disease Gleevec 300 mg daily continued Gleevec dose reduced to 200 mg daily beginning 01/07/2021 (diarrhea) Gleevec placed on hold 01/27/2021 due to diarrhea CT abdomen/pelvis 03/04/2021-no irregular annular wall thickening in the remnant rectum contiguous with presacral space, compatible with recurrent tumor.  Small rectovaginal fistula identified.  Small amount of gas and rectal contrast in the vagina and vaginal introitus.  Interval progression of metastatic adenopathy in the left perirectal, lower left pelvic sidewall and high presacral chains. Sunitinib 37.5 mg daily beginning 04/04/2021 Sunitinib dose reduced to 25 mg daily 05/06/2021 due to diarrhea, mild neutropenia (she began the reduced dose on 05/15/2021) Sutent placed on hold 05/19/2021 due to diarrhea, weight loss, failure to thrive Sunitinib resumed 25 mg daily 05/26/2021 Sunitinib discontinued 05/30/2021 secondary to persistent diarrhea CTs 06/15/2021-similar appearance of annular wall thickening with luminal narrowing in the remnant rectum.  Previously noted small rectovaginal fistula again identified with gas noted within the lumen of the vagina.  Mild increase in the size of left perirectal left pelvic sidewall soft tissue nodules.  Large lobulated mass within the presacral soft tissue region not significantly changed in the interval. Regorafenib 06/22/2021-discontinued 07/01/2021 secondary to diarrhea Regorafenib resumed at a dose of 40 mg daily 07/08/2021 Regorafenib cycle completed 08/30/2021 Regorafenib cycle started 09/08/2021 Regorafenib placed on hold 09/29/2021 CTs 10/13/2021-increase size of left perianal, left perirectal and presacral soft tissue masses Ripretinib 10/22/2021 Remote history of pulmonary embolism-maintained on apixaban Multiple colon polyps noted on the colonoscopy 07/21/2014 with the pathology revealing tubular adenomas Hospitalization  06/21/2015 through 07/06/2015 with nausea/vomiting, high ileostomy output; found to have delayed gastric emptying. Water soluble contrast enema 08/13/2015 positive for a small to moderate volume of water soluble contrast leakage from the low anterior resection and anastomosis site in the pelvis Ileostomy takedown and resection of cystic abdominal wall mass ( benign pathology) on 01/28/2016 Anemia with ferritin in the low normal range 05/23/2016 and 06/20/2016. Ferrous sulfate increased to twice daily 06/26/2016. Improved.       Disposition: Ms. Stefano has been maintained on ripretinib for the past 2 weeks.  She is tolerating the ripretinib well.  Her overall performance status appears partially improved.  She will continue ripretinib.  She will return for an office and lab visit in 2 weeks.  Brianna Coder, MD  11/07/2021  11:11 AM

## 2021-11-07 NOTE — Telephone Encounter (Signed)
Received refill request for ripretinib from [facility]. OK to refill, same dose per Dr. Benay Spice.

## 2021-11-08 ENCOUNTER — Telehealth: Payer: Self-pay | Admitting: *Deleted

## 2021-11-08 NOTE — Telephone Encounter (Signed)
Called Brianna Walker to schedule baseline ECHO recommended for her treatment with Ripretinib. She declines the test. MD/NP made aware.

## 2021-11-09 ENCOUNTER — Other Ambulatory Visit: Payer: Self-pay | Admitting: Nurse Practitioner

## 2021-11-11 ENCOUNTER — Other Ambulatory Visit (HOSPITAL_COMMUNITY): Payer: Medicare HMO

## 2021-11-15 ENCOUNTER — Telehealth: Payer: Self-pay

## 2021-11-15 ENCOUNTER — Other Ambulatory Visit (HOSPITAL_BASED_OUTPATIENT_CLINIC_OR_DEPARTMENT_OTHER): Payer: Self-pay

## 2021-11-15 ENCOUNTER — Other Ambulatory Visit: Payer: Self-pay | Admitting: Nurse Practitioner

## 2021-11-15 DIAGNOSIS — C49A4 Gastrointestinal stromal tumor of large intestine: Secondary | ICD-10-CM

## 2021-11-15 MED ORDER — OXYCODONE HCL 5 MG PO TABS
5.0000 mg | ORAL_TABLET | ORAL | 0 refills | Status: DC | PRN
  Filled 2021-11-15: qty 60, 5d supply, fill #0

## 2021-11-15 NOTE — Telephone Encounter (Signed)
Patient called in for a refill on her Oxycodone and Morphine, the request was placed on the NP's desk

## 2021-11-18 ENCOUNTER — Other Ambulatory Visit (HOSPITAL_BASED_OUTPATIENT_CLINIC_OR_DEPARTMENT_OTHER): Payer: Self-pay

## 2021-11-24 ENCOUNTER — Other Ambulatory Visit: Payer: Self-pay

## 2021-11-24 ENCOUNTER — Other Ambulatory Visit (HOSPITAL_BASED_OUTPATIENT_CLINIC_OR_DEPARTMENT_OTHER): Payer: Self-pay

## 2021-11-24 ENCOUNTER — Encounter: Payer: Self-pay | Admitting: Nurse Practitioner

## 2021-11-24 ENCOUNTER — Inpatient Hospital Stay: Payer: Medicare HMO

## 2021-11-24 ENCOUNTER — Inpatient Hospital Stay (HOSPITAL_BASED_OUTPATIENT_CLINIC_OR_DEPARTMENT_OTHER): Payer: Medicare HMO | Admitting: Nurse Practitioner

## 2021-11-24 VITALS — BP 110/60 | HR 140 | Temp 98.2°F | Resp 18 | Ht 64.0 in | Wt 107.6 lb

## 2021-11-24 DIAGNOSIS — C49A4 Gastrointestinal stromal tumor of large intestine: Secondary | ICD-10-CM

## 2021-11-24 DIAGNOSIS — G893 Neoplasm related pain (acute) (chronic): Secondary | ICD-10-CM | POA: Diagnosis not present

## 2021-11-24 DIAGNOSIS — E860 Dehydration: Secondary | ICD-10-CM | POA: Diagnosis not present

## 2021-11-24 DIAGNOSIS — Z79891 Long term (current) use of opiate analgesic: Secondary | ICD-10-CM | POA: Diagnosis not present

## 2021-11-24 DIAGNOSIS — Z7901 Long term (current) use of anticoagulants: Secondary | ICD-10-CM | POA: Diagnosis not present

## 2021-11-24 DIAGNOSIS — D649 Anemia, unspecified: Secondary | ICD-10-CM | POA: Diagnosis not present

## 2021-11-24 DIAGNOSIS — R55 Syncope and collapse: Secondary | ICD-10-CM | POA: Diagnosis not present

## 2021-11-24 DIAGNOSIS — C49A5 Gastrointestinal stromal tumor of rectum: Secondary | ICD-10-CM | POA: Diagnosis not present

## 2021-11-24 DIAGNOSIS — Z86711 Personal history of pulmonary embolism: Secondary | ICD-10-CM | POA: Diagnosis not present

## 2021-11-24 DIAGNOSIS — R197 Diarrhea, unspecified: Secondary | ICD-10-CM | POA: Diagnosis not present

## 2021-11-24 LAB — CMP (CANCER CENTER ONLY)
ALT: 15 U/L (ref 0–44)
AST: 17 U/L (ref 15–41)
Albumin: 2.7 g/dL — ABNORMAL LOW (ref 3.5–5.0)
Alkaline Phosphatase: 88 U/L (ref 38–126)
Anion gap: 12 (ref 5–15)
BUN: 11 mg/dL (ref 8–23)
CO2: 23 mmol/L (ref 22–32)
Calcium: 9 mg/dL (ref 8.9–10.3)
Chloride: 108 mmol/L (ref 98–111)
Creatinine: 0.96 mg/dL (ref 0.44–1.00)
GFR, Estimated: >60 mL/min (ref >60)
Glucose, Bld: 114 mg/dL — ABNORMAL HIGH (ref 70–99)
Potassium: 4.1 mmol/L (ref 3.5–5.1)
Sodium: 143 mmol/L (ref 135–145)
Total Bilirubin: 1.2 mg/dL (ref 0.3–1.2)
Total Protein: 5.7 g/dL — ABNORMAL LOW (ref 6.5–8.1)

## 2021-11-24 LAB — CBC WITH DIFFERENTIAL (CANCER CENTER ONLY)
Abs Immature Granulocytes: 0.01 10*3/uL (ref 0.00–0.07)
Basophils Absolute: 0.1 10*3/uL (ref 0.0–0.1)
Basophils Relative: 1 %
Eosinophils Absolute: 0 10*3/uL (ref 0.0–0.5)
Eosinophils Relative: 1 %
HCT: 34.2 % — ABNORMAL LOW (ref 36.0–46.0)
Hemoglobin: 10.8 g/dL — ABNORMAL LOW (ref 12.0–15.0)
Immature Granulocytes: 0 %
Lymphocytes Relative: 33 %
Lymphs Abs: 2.8 10*3/uL (ref 0.7–4.0)
MCH: 26.7 pg (ref 26.0–34.0)
MCHC: 31.6 g/dL (ref 30.0–36.0)
MCV: 84.4 fL (ref 80.0–100.0)
Monocytes Absolute: 0.5 10*3/uL (ref 0.1–1.0)
Monocytes Relative: 6 %
Neutro Abs: 5.1 10*3/uL (ref 1.7–7.7)
Neutrophils Relative %: 59 %
Platelet Count: 351 10*3/uL (ref 150–400)
RBC: 4.05 MIL/uL (ref 3.87–5.11)
RDW: 15.9 % — ABNORMAL HIGH (ref 11.5–15.5)
WBC Count: 8.5 10*3/uL (ref 4.0–10.5)
nRBC: 0 % (ref 0.0–0.2)

## 2021-11-24 MED ORDER — OXYCODONE HCL 5 MG PO TABS
10.0000 mg | ORAL_TABLET | Freq: Once | ORAL | Status: AC
  Administered 2021-11-24: 10 mg via ORAL
  Filled 2021-11-24: qty 2

## 2021-11-24 MED ORDER — OXYCODONE HCL 5 MG PO TABS
5.0000 mg | ORAL_TABLET | ORAL | 0 refills | Status: DC | PRN
  Filled 2021-11-24: qty 60, 5d supply, fill #0

## 2021-11-24 MED ORDER — SODIUM CHLORIDE 0.9 % IV SOLN: INTRAVENOUS | Status: AC

## 2021-11-24 NOTE — Patient Instructions (Signed)
Rehydration, Adult Rehydration is the replacement of body fluids, salts, and minerals (electrolytes) that are lost during dehydration. Dehydration is when there is not enough water or other fluids in the body. This happens when you lose more fluids than you take in. Common causes of dehydration include: Not drinking enough fluids. This can occur when you are ill or doing activities that require a lot of energy, especially in hot weather. Conditions that cause loss of water or other fluids, such as diarrhea, vomiting, sweating, or urinating a lot. Other illnesses, such as fever or infection. Certain medicines, such as those that remove excess fluid from the body (diuretics). Symptoms of mild or moderate dehydration may include thirst, dry lips and mouth, and dizziness. Symptoms of severe dehydration may include increased heart rate, confusion, fainting, and not urinating. For severe dehydration, you may need to get fluids through an IV at the hospital. For mild or moderate dehydration, you can usually rehydrate at home by drinking certain fluids as told by your health care provider. What are the risks? Generally, rehydration is safe. However, taking in too much fluid (overhydration) can be a problem. This is rare. Overhydration can cause an electrolyte imbalance, kidney failure, or a decrease in salt (sodium) levels in the body. Supplies needed You will need an oral rehydration solution (ORS) if your health care provider tells you to use one. This is a drink to treat dehydration. It can be found in pharmacies and retail stores. How to rehydrate Fluids Follow instructions from your health care provider for rehydration. The kind of fluid and the amount you should drink depend on your condition. In general, you should choose drinks that you prefer. If told by your health care provider, drink an ORS. Make an ORS by following instructions on the package. Start by drinking small amounts, about  cup (120  mL) every 5-10 minutes. Slowly increase how much you drink until you have taken the amount recommended by your health care provider. Drink enough clear fluids to keep your urine pale yellow. If you were told to drink an ORS, finish it first, then start slowly drinking other clear fluids. Drink fluids such as: Water. This includes sparkling water and flavored water. Drinking only water can lead to having too little sodium in your body (hyponatremia). Follow the advice of your health care provider. Water from ice chips you suck on. Fruit juice with water you add to it (diluted). Sports drinks. Hot or cold herbal teas. Broth-based soups. Milk or milk products. Food Follow instructions from your health care provider about what to eat while you rehydrate. Your health care provider may recommend that you slowly begin eating regular foods in small amounts. Eat foods that contain a healthy balance of electrolytes, such as bananas, oranges, potatoes, tomatoes, and spinach. Avoid foods that are greasy or contain a lot of sugar. In some cases, you may get nutrition through a feeding tube that is passed through your nose and into your stomach (nasogastric tube, or NG tube). This may be done if you have uncontrolled vomiting or diarrhea. Beverages to avoid  Certain beverages may make dehydration worse. While you rehydrate, avoid drinking alcohol. How to tell if you are recovering from dehydration You may be recovering from dehydration if: You are urinating more often than before you started rehydrating. Your urine is pale yellow. Your energy level improves. You vomit less frequently. You have diarrhea less frequently. Your appetite improves or returns to normal. You feel less dizzy or less light-headed.   Your skin tone and color start to look more normal. Follow these instructions at home: Take over-the-counter and prescription medicines only as told by your health care provider. Do not take sodium  tablets. Doing this can lead to having too much sodium in your body (hypernatremia). Contact a health care provider if: You continue to have symptoms of mild or moderate dehydration, such as: Thirst. Dry lips. Slightly dry mouth. Dizziness. Dark urine or less urine than normal. Muscle cramps. You continue to vomit or have diarrhea. Get help right away if you: Have symptoms of dehydration that get worse. Have a fever. Have a severe headache. Have been vomiting and the following happens: Your vomiting gets worse or does not go away. Your vomit includes blood or green matter (bile). You cannot eat or drink without vomiting. Have problems with urination or bowel movements, such as: Diarrhea that gets worse or does not go away. Blood in your stool (feces). This may cause stool to look black and tarry. Not urinating, or urinating only a small amount of very dark urine, within 6-8 hours. Have trouble breathing. Have symptoms that get worse with treatment. These symptoms may represent a serious problem that is an emergency. Do not wait to see if the symptoms will go away. Get medical help right away. Call your local emergency services (911 in the [place] Do not drive yourself to the hospital. Summary Rehydration is the replacement of body fluids and minerals (electrolytes) that are lost during dehydration. Follow instructions from your health care provider for rehydration. The kind of fluid and amount you should drink depend on your condition. Slowly increase how much you drink until you have taken the amount recommended by your health care provider. Contact your health care provider if you continue to show signs of mild or moderate dehydration. This information is not intended to replace advice given to you by your health care provider. Make sure you discuss any questions you have with your health care provider. Document Revised: 06/18/2019 Document Reviewed: 04/28/2019 Elsevier Patient  Education  2023 [employer]  

## 2021-11-24 NOTE — Progress Notes (Signed)
[facility] OFFICE PROGRESS NOTE   Diagnosis: Gastrointestinal stromal tumor  INTERVAL HISTORY:   Ms. Lauritsen returns as scheduled.  She continues ripretinib.  She reports overall good oral intake.  She thinks the fluid intake may be suboptimal at time.  She has frequent formed bowel movements.  She denies diarrhea.  No nausea or vomiting.  No rash.  Objective:  Vital signs in last 24 hours:  Blood pressure 110/60, pulse (!) 140, temperature 98.2 F (36.8 C), temperature source Oral, resp. rate 18, height '5\' 4"'  (1.626 m), weight 107 lb 9.6 oz (48.8 kg), SpO2 100 %.    HEENT: No thrush or ulcers.  Mucous membranes are moist. Resp: Lungs clear bilaterally. Cardio: Regular, tachycardic. GI: Abdomen soft and nontender.  No hepatosplenomegaly.  Right abdomen hernia. Vascular: No leg edema. Skin: Marked decrease in skin turgor.   Lab Results:  Lab Results  Component Value Date   WBC 8.5 11/24/2021   HGB 10.8 (L) 11/24/2021   HCT 34.2 (L) 11/24/2021   MCV 84.4 11/24/2021   PLT 351 11/24/2021   NEUTROABS 5.1 11/24/2021    Imaging:  No results found.  Medications: I have reviewed the patient's current medications.  Assessment/Plan: Gastrointestinal stromal tumor of the rectum Status post an endoscopic ultrasound on 08/06/2014 confirming a mass abutting the distal rectal wall with an FNA biopsy confirming a gastrointestinal stromal tumor Initiation of Gleevec 09/04/2014 MRI pelvis 12/15/2014 with interval decreased size of the large anorectal mass with central necrosis. Continuation of Gleevec Gleevec placed on hold 06/02/2015 in anticipation of surgery 06/04/2015 status post low anterior resection and diverting loop ileostomy, resection of tumor Pathology: 9.5 cm tumor; GISTsubtype spindle; mitotic rate 1/50 high-power field; positive margin of resection; c-Kit mutation detected (Exon 13: c.[id number] (p.[id number])) Adjuvant Gleevec 08/19/2015 (three-year in  total course planned) Ileostomy takedown 01/28/2016 Held 01/28/2016 through 02/14/2016, resumed 02/15/2016 Gleevec stop 04/11/2016-04/18/2016, and 04/26/2016-05/08/2016 Gleevec resumed at a dose of 200 mg daily on 05/09/2016 Gleevec dose increased to 300 mg daily beginning 06/20/2016 Gleevec dose decreased to 200 mg daily beginning 06/23/2016 (poor tolerance at 300 mg dose) CTs 11/22/2017- enhancing soft tissue mass in the presacral space measuring 5.4 x 3.5 cm.  Enlarged pelvic lymph nodes. Gleevec continued at a dose of 200 mg daily Restaging CTs 03/11/2018- interval decrease in size of the presacral soft tissue mass.  Stable small pelvic lymph nodes. Gleevec discontinued 03/13/2018 CT 11/14/2018- new 2.4 cm left pelvic nodule at the piriformis muscle, stable presacral lymph nodes, right abdominal wall hernia, ventral hernia Gleevec 200 mg daily 12/04/2018 CTs 03/31/2019 -decrease in size of soft tissue nodule deep left pelvis.  5 nodules in the sigmoid mesocolon stable to slightly larger.  Stable presacral soft tissue thickening.  No findings for new metastatic disease. CT abdomen/pelvis 09/01/2019-progressive presacral and left perirectal nodularity.  Additional mesenteric nodules along the sigmoid mesocolon and left pelvis unchanged. Gleevec dose escalated to 300 mg daily 09/02/2019 Gleevec dose escalated to 400 mg alternating with 300 mg beginning 09/16/2019 Gleevec 300 mg daily beginning 09/30/2019 (unable to tolerate 400 mg due to diarrhea) CT 12/15/2019-pelvic/peritoneal nodularity-unchanged CT 06/02/2020-mildly worsened soft tissue density between the lower rectum and the sacrum. Mildly worsened superior anorectal adenopathy and perirectal adenopathy.  Office review of CT consistent with stable disease Gleevec 300 mg daily continued Gleevec dose reduced to 200 mg daily beginning 01/07/2021 (diarrhea) Gleevec placed on hold 01/27/2021 due to diarrhea CT abdomen/pelvis 03/04/2021-no irregular annular  wall thickening in the remnant rectum  contiguous with presacral space, compatible with recurrent tumor.  Small rectovaginal fistula identified.  Small amount of gas and rectal contrast in the vagina and vaginal introitus.  Interval progression of metastatic adenopathy in the left perirectal, lower left pelvic sidewall and high presacral chains. Sunitinib 37.5 mg daily beginning 04/04/2021 Sunitinib dose reduced to 25 mg daily 05/06/2021 due to diarrhea, mild neutropenia (she began the reduced dose on 05/15/2021) Sutent placed on hold 05/19/2021 due to diarrhea, weight loss, failure to thrive Sunitinib resumed 25 mg daily 05/26/2021 Sunitinib discontinued 05/30/2021 secondary to persistent diarrhea CTs 06/15/2021-similar appearance of annular wall thickening with luminal narrowing in the remnant rectum.  Previously noted small rectovaginal fistula again identified with gas noted within the lumen of the vagina.  Mild increase in the size of left perirectal left pelvic sidewall soft tissue nodules.  Large lobulated mass within the presacral soft tissue region not significantly changed in the interval. Regorafenib 06/22/2021-discontinued 07/01/2021 secondary to diarrhea Regorafenib resumed at a dose of 40 mg daily 07/08/2021 Regorafenib cycle completed 08/30/2021 Regorafenib cycle started 09/08/2021 Regorafenib placed on hold 09/29/2021 CTs 10/13/2021-increase size of left perianal, left perirectal and presacral soft tissue masses Ripretinib 10/22/2021 Remote history of pulmonary embolism-maintained on apixaban Multiple colon polyps noted on the colonoscopy 07/21/2014 with the pathology revealing tubular adenomas Hospitalization 06/21/2015 through 07/06/2015 with nausea/vomiting, high ileostomy output; found to have delayed gastric emptying. Water soluble contrast enema 08/13/2015 positive for a small to moderate volume of water soluble contrast leakage from the low anterior resection and anastomosis site in the  pelvis Ileostomy takedown and resection of cystic abdominal wall mass ( benign pathology) on 01/28/2016 Anemia with ferritin in the low normal range 05/23/2016 and 06/20/2016. Ferrous sulfate increased to twice daily 06/26/2016. Improved.        Disposition: Ms. Ojo appears unchanged.  She has been maintained on ripretinib for about a month now.  She seems to be tolerating well.  Plan to continue the same.  She is tachycardic and has decreased skin turgor.  She is likely dehydrated.  She will receive IV fluids today with repeat vital signs.  She will work on increasing fluid intake.  She will return for lab and follow-up in approximately 2 weeks.  We are available to see her sooner if needed.    Ned Card ANP/GNP-BC   11/24/2021  11:10 AM

## 2021-11-28 ENCOUNTER — Other Ambulatory Visit (HOSPITAL_BASED_OUTPATIENT_CLINIC_OR_DEPARTMENT_OTHER): Payer: Self-pay

## 2021-12-05 ENCOUNTER — Other Ambulatory Visit (HOSPITAL_BASED_OUTPATIENT_CLINIC_OR_DEPARTMENT_OTHER): Payer: Self-pay

## 2021-12-05 ENCOUNTER — Other Ambulatory Visit: Payer: Self-pay | Admitting: Nurse Practitioner

## 2021-12-05 DIAGNOSIS — C49A4 Gastrointestinal stromal tumor of large intestine: Secondary | ICD-10-CM

## 2021-12-05 MED ORDER — OXYCODONE HCL 5 MG PO TABS
5.0000 mg | ORAL_TABLET | ORAL | 0 refills | Status: DC | PRN
  Filled 2021-12-05: qty 60, 5d supply, fill #0

## 2021-12-06 ENCOUNTER — Other Ambulatory Visit (HOSPITAL_BASED_OUTPATIENT_CLINIC_OR_DEPARTMENT_OTHER): Payer: Self-pay

## 2021-12-08 ENCOUNTER — Inpatient Hospital Stay: Payer: Medicare HMO

## 2021-12-08 ENCOUNTER — Encounter: Payer: Self-pay | Admitting: Nurse Practitioner

## 2021-12-08 ENCOUNTER — Telehealth: Payer: Self-pay

## 2021-12-08 ENCOUNTER — Inpatient Hospital Stay: Payer: Medicare HMO | Attending: Oncology

## 2021-12-08 ENCOUNTER — Other Ambulatory Visit: Payer: Self-pay | Admitting: *Deleted

## 2021-12-08 ENCOUNTER — Inpatient Hospital Stay (HOSPITAL_BASED_OUTPATIENT_CLINIC_OR_DEPARTMENT_OTHER): Payer: Medicare HMO | Admitting: Nurse Practitioner

## 2021-12-08 VITALS — BP 98/67 | HR 127 | Temp 98.1°F | Resp 18 | Ht 64.0 in | Wt 107.0 lb

## 2021-12-08 DIAGNOSIS — C49A5 Gastrointestinal stromal tumor of rectum: Secondary | ICD-10-CM | POA: Insufficient documentation

## 2021-12-08 DIAGNOSIS — C49A4 Gastrointestinal stromal tumor of large intestine: Secondary | ICD-10-CM

## 2021-12-08 DIAGNOSIS — Z79899 Other long term (current) drug therapy: Secondary | ICD-10-CM | POA: Diagnosis not present

## 2021-12-08 DIAGNOSIS — D649 Anemia, unspecified: Secondary | ICD-10-CM | POA: Diagnosis not present

## 2021-12-08 DIAGNOSIS — Z86711 Personal history of pulmonary embolism: Secondary | ICD-10-CM | POA: Insufficient documentation

## 2021-12-08 LAB — CMP (CANCER CENTER ONLY)
ALT: 15 U/L (ref 0–44)
AST: 16 U/L (ref 15–41)
Albumin: 2.6 g/dL — ABNORMAL LOW (ref 3.5–5.0)
Alkaline Phosphatase: 80 U/L (ref 38–126)
Anion gap: 11 (ref 5–15)
BUN: 15 mg/dL (ref 8–23)
CO2: 25 mmol/L (ref 22–32)
Calcium: 8.5 mg/dL — ABNORMAL LOW (ref 8.9–10.3)
Chloride: 106 mmol/L (ref 98–111)
Creatinine: 1.09 mg/dL — ABNORMAL HIGH (ref 0.44–1.00)
GFR, Estimated: 53 mL/min — ABNORMAL LOW
Glucose, Bld: 115 mg/dL — ABNORMAL HIGH (ref 70–99)
Potassium: 3.8 mmol/L (ref 3.5–5.1)
Sodium: 142 mmol/L (ref 135–145)
Total Bilirubin: 1 mg/dL (ref 0.3–1.2)
Total Protein: 6 g/dL — ABNORMAL LOW (ref 6.5–8.1)

## 2021-12-08 LAB — CBC WITH DIFFERENTIAL (CANCER CENTER ONLY)
Abs Immature Granulocytes: 0.05 10*3/uL (ref 0.00–0.07)
Basophils Absolute: 0.1 10*3/uL (ref 0.0–0.1)
Basophils Relative: 0 %
Eosinophils Absolute: 0 10*3/uL (ref 0.0–0.5)
Eosinophils Relative: 0 %
HCT: 35.9 % — ABNORMAL LOW (ref 36.0–46.0)
Hemoglobin: 11.2 g/dL — ABNORMAL LOW (ref 12.0–15.0)
Immature Granulocytes: 0 %
Lymphocytes Relative: 22 %
Lymphs Abs: 2.7 10*3/uL (ref 0.7–4.0)
MCH: 26.7 pg (ref 26.0–34.0)
MCHC: 31.2 g/dL (ref 30.0–36.0)
MCV: 85.5 fL (ref 80.0–100.0)
Monocytes Absolute: 0.6 10*3/uL (ref 0.1–1.0)
Monocytes Relative: 5 %
Neutro Abs: 8.5 10*3/uL — ABNORMAL HIGH (ref 1.7–7.7)
Neutrophils Relative %: 73 %
Platelet Count: 370 10*3/uL (ref 150–400)
RBC: 4.2 MIL/uL (ref 3.87–5.11)
RDW: 16 % — ABNORMAL HIGH (ref 11.5–15.5)
WBC Count: 11.9 10*3/uL — ABNORMAL HIGH (ref 4.0–10.5)
nRBC: 0 % (ref 0.0–0.2)

## 2021-12-08 MED ORDER — SODIUM CHLORIDE 0.9 % IV SOLN: INTRAVENOUS | Status: DC

## 2021-12-08 MED ORDER — RIPRETINIB 50 MG PO TABS: 150.0000 mg | ORAL_TABLET | Freq: Every day | ORAL | 0 refills | Status: DC

## 2021-12-08 NOTE — Progress Notes (Signed)
Received faxed refill request from [facility] by Elenor Legato.

## 2021-12-08 NOTE — Progress Notes (Signed)
[facility] OFFICE PROGRESS NOTE   Diagnosis: Gastrointestinal stromal tumor  INTERVAL HISTORY:   Ms. Horwitz returns as scheduled.  She continues ripretinib.  She reports improved oral intake.  She feels fluid intake is adequate.  Energy level is better.  She denies diarrhea, notes "formed stool".  No nausea or vomiting.  Pain is "not real bad".  She estimates taking 2 breakthrough pain pills a day.  No rash.  No hand or foot pain or redness.  Objective:  Vital signs in last 24 hours:  Blood pressure 98/67, pulse (!) 130, temperature 98.1 F (36.7 C), temperature source Oral, resp. rate 18, height $RemoveBe'5\' 4"'nyenRJpLp$  (1.626 m), weight 107 lb (48.5 kg), SpO2 95 %.    HEENT: Mucous membranes appear moist.  No thrush or ulcers. Resp: Lungs clear bilaterally. Cardio: Regular, tachycardic. GI: Abdomen soft and nontender.  No hepatomegaly. Vascular: No leg edema. Neuro: Alert and oriented. Skin: Decreased skin turgor.   Lab Results:  Lab Results  Component Value Date   WBC 11.9 (H) 12/08/2021   HGB 11.2 (L) 12/08/2021   HCT 35.9 (L) 12/08/2021   MCV 85.5 12/08/2021   PLT 370 12/08/2021   NEUTROABS 8.5 (H) 12/08/2021    Imaging:  No results found.  Medications: I have reviewed the patient's current medications.  Assessment/Plan: Gastrointestinal stromal tumor of the rectum Status post an endoscopic ultrasound on 08/06/2014 confirming a mass abutting the distal rectal wall with an FNA biopsy confirming a gastrointestinal stromal tumor Initiation of Gleevec 09/04/2014 MRI pelvis 12/15/2014 with interval decreased size of the large anorectal mass with central necrosis. Continuation of Gleevec Gleevec placed on hold 06/02/2015 in anticipation of surgery 06/04/2015 status post low anterior resection and diverting loop ileostomy, resection of tumor Pathology: 9.5 cm tumor; GISTsubtype spindle; mitotic rate 1/50 high-power field; positive margin of resection; c-Kit mutation  detected (Exon 13: c.[id number] ([id number])) Adjuvant Gleevec 08/19/2015 (three-year in total course planned) Ileostomy takedown 01/28/2016 Held 01/28/2016 through 02/14/2016, resumed 02/15/2016 Gleevec stop 04/11/2016-04/18/2016, and 04/26/2016-05/08/2016 Gleevec resumed at a dose of 200 mg daily on 05/09/2016 Gleevec dose increased to 300 mg daily beginning 06/20/2016 Gleevec dose decreased to 200 mg daily beginning 06/23/2016 (poor tolerance at 300 mg dose) CTs 11/22/2017- enhancing soft tissue mass in the presacral space measuring 5.4 x 3.5 cm.  Enlarged pelvic lymph nodes. Gleevec continued at a dose of 200 mg daily Restaging CTs 03/11/2018- interval decrease in size of the presacral soft tissue mass.  Stable small pelvic lymph nodes. Gleevec discontinued 03/13/2018 CT 11/14/2018- new 2.4 cm left pelvic nodule at the piriformis muscle, stable presacral lymph nodes, right abdominal wall hernia, ventral hernia Gleevec 200 mg daily 12/04/2018 CTs 03/31/2019 -decrease in size of soft tissue nodule deep left pelvis.  5 nodules in the sigmoid mesocolon stable to slightly larger.  Stable presacral soft tissue thickening.  No findings for new metastatic disease. CT abdomen/pelvis 09/01/2019-progressive presacral and left perirectal nodularity.  Additional mesenteric nodules along the sigmoid mesocolon and left pelvis unchanged. Gleevec dose escalated to 300 mg daily 09/02/2019 Gleevec dose escalated to 400 mg alternating with 300 mg beginning 09/16/2019 Gleevec 300 mg daily beginning 09/30/2019 (unable to tolerate 400 mg due to diarrhea) CT 12/15/2019-pelvic/peritoneal nodularity-unchanged CT 06/02/2020-mildly worsened soft tissue density between the lower rectum and the sacrum. Mildly worsened superior anorectal adenopathy and perirectal adenopathy.  Office review of CT consistent with stable disease Gleevec 300 mg daily continued Gleevec dose reduced to 200 mg daily beginning 01/07/2021 (diarrhea) Gleevec placed  on  hold 01/27/2021 due to diarrhea CT abdomen/pelvis 03/04/2021-no irregular annular wall thickening in the remnant rectum contiguous with presacral space, compatible with recurrent tumor.  Small rectovaginal fistula identified.  Small amount of gas and rectal contrast in the vagina and vaginal introitus.  Interval progression of metastatic adenopathy in the left perirectal, lower left pelvic sidewall and high presacral chains. Sunitinib 37.5 mg daily beginning 04/04/2021 Sunitinib dose reduced to 25 mg daily 05/06/2021 due to diarrhea, mild neutropenia (she began the reduced dose on 05/15/2021) Sutent placed on hold 05/19/2021 due to diarrhea, weight loss, failure to thrive Sunitinib resumed 25 mg daily 05/26/2021 Sunitinib discontinued 05/30/2021 secondary to persistent diarrhea CTs 06/15/2021-similar appearance of annular wall thickening with luminal narrowing in the remnant rectum.  Previously noted small rectovaginal fistula again identified with gas noted within the lumen of the vagina.  Mild increase in the size of left perirectal left pelvic sidewall soft tissue nodules.  Large lobulated mass within the presacral soft tissue region not significantly changed in the interval. Regorafenib 06/22/2021-discontinued 07/01/2021 secondary to diarrhea Regorafenib resumed at a dose of 40 mg daily 07/08/2021 Regorafenib cycle completed 08/30/2021 Regorafenib cycle started 09/08/2021 Regorafenib placed on hold 09/29/2021 CTs 10/13/2021-increase size of left perianal, left perirectal and presacral soft tissue masses Ripretinib 10/22/2021 Remote history of pulmonary embolism-maintained on apixaban Multiple colon polyps noted on the colonoscopy 07/21/2014 with the pathology revealing tubular adenomas Hospitalization 06/21/2015 through 07/06/2015 with nausea/vomiting, high ileostomy output; found to have delayed gastric emptying. Water soluble contrast enema 08/13/2015 positive for a small to moderate volume of water soluble  contrast leakage from the low anterior resection and anastomosis site in the pelvis Ileostomy takedown and resection of cystic abdominal wall mass ( benign pathology) on 01/28/2016 Anemia with ferritin in the low normal range 05/23/2016 and 06/20/2016. Ferrous sulfate increased to twice daily 06/26/2016. Improved.      Disposition: Ms. Draughon appears unchanged.  She seems to be tolerating ripretinib well.  Plan to continue the same.  Restaging CTs prior to next office visit.  She is tachycardic and hypotensive.  We discontinued her blood pressure medication.  She agrees to 500 cc of normal saline.  We will obtain repeat vital signs after the IV fluids.  She will return for follow-up in 2 weeks.  We are available to see her sooner if needed    Ned Card ANP/GNP-BC   12/08/2021  11:09 AM

## 2021-12-08 NOTE — Telephone Encounter (Signed)
CT scan is schedule on 12/19/21 at [facility] 2:00. Patient need to be there at noon, NPO 4 hours prior to 2.

## 2021-12-19 ENCOUNTER — Other Ambulatory Visit: Payer: Self-pay | Admitting: Nurse Practitioner

## 2021-12-19 ENCOUNTER — Other Ambulatory Visit (HOSPITAL_BASED_OUTPATIENT_CLINIC_OR_DEPARTMENT_OTHER): Payer: Self-pay

## 2021-12-19 ENCOUNTER — Ambulatory Visit (HOSPITAL_COMMUNITY): Payer: Medicare HMO

## 2021-12-19 ENCOUNTER — Telehealth: Payer: Self-pay

## 2021-12-19 DIAGNOSIS — C49A4 Gastrointestinal stromal tumor of large intestine: Secondary | ICD-10-CM

## 2021-12-19 MED ORDER — OXYCODONE HCL 5 MG PO TABS
5.0000 mg | ORAL_TABLET | ORAL | 0 refills | Status: DC | PRN
  Filled 2021-12-19: qty 60, 13d supply, fill #0

## 2021-12-19 NOTE — Telephone Encounter (Signed)
Brianna Walker called in and requested a refill of her oxycodone and Ms Contin, placed the request was placed on the NP's desk

## 2021-12-20 ENCOUNTER — Other Ambulatory Visit (HOSPITAL_BASED_OUTPATIENT_CLINIC_OR_DEPARTMENT_OTHER): Payer: Self-pay

## 2021-12-20 ENCOUNTER — Other Ambulatory Visit: Payer: Self-pay | Admitting: Nurse Practitioner

## 2021-12-20 DIAGNOSIS — C49A4 Gastrointestinal stromal tumor of large intestine: Secondary | ICD-10-CM

## 2021-12-20 MED ORDER — MORPHINE SULFATE ER 30 MG PO TBCR
30.0000 mg | EXTENDED_RELEASE_TABLET | Freq: Two times a day (BID) | ORAL | 0 refills | Status: DC
  Filled 2021-12-20: qty 60, 30d supply, fill #0

## 2021-12-22 ENCOUNTER — Inpatient Hospital Stay: Payer: Medicare HMO | Admitting: Oncology

## 2021-12-22 ENCOUNTER — Inpatient Hospital Stay: Payer: Medicare HMO

## 2021-12-24 ENCOUNTER — Ambulatory Visit (HOSPITAL_BASED_OUTPATIENT_CLINIC_OR_DEPARTMENT_OTHER)
Admission: RE | Admit: 2021-12-24 | Discharge: 2021-12-24 | Disposition: A | Discharge status: Home / Self Care | Payer: Medicare HMO | Source: Ambulatory Visit | Attending: Nurse Practitioner | Admitting: Nurse Practitioner

## 2021-12-24 DIAGNOSIS — C49A4 Gastrointestinal stromal tumor of large intestine: Secondary | ICD-10-CM | POA: Diagnosis not present

## 2021-12-24 DIAGNOSIS — C49A0 Gastrointestinal stromal tumor, unspecified site: Secondary | ICD-10-CM | POA: Diagnosis not present

## 2021-12-24 DIAGNOSIS — N281 Cyst of kidney, acquired: Secondary | ICD-10-CM | POA: Diagnosis not present

## 2021-12-27 ENCOUNTER — Other Ambulatory Visit (HOSPITAL_BASED_OUTPATIENT_CLINIC_OR_DEPARTMENT_OTHER): Payer: Self-pay

## 2021-12-29 ENCOUNTER — Inpatient Hospital Stay (HOSPITAL_BASED_OUTPATIENT_CLINIC_OR_DEPARTMENT_OTHER): Payer: Medicare HMO | Admitting: Oncology

## 2021-12-29 ENCOUNTER — Other Ambulatory Visit (HOSPITAL_BASED_OUTPATIENT_CLINIC_OR_DEPARTMENT_OTHER): Payer: Self-pay

## 2021-12-29 ENCOUNTER — Inpatient Hospital Stay: Payer: Medicare HMO

## 2021-12-29 DIAGNOSIS — Z86711 Personal history of pulmonary embolism: Secondary | ICD-10-CM | POA: Diagnosis not present

## 2021-12-29 DIAGNOSIS — D649 Anemia, unspecified: Secondary | ICD-10-CM | POA: Diagnosis not present

## 2021-12-29 DIAGNOSIS — C49A4 Gastrointestinal stromal tumor of large intestine: Secondary | ICD-10-CM

## 2021-12-29 DIAGNOSIS — Z79899 Other long term (current) drug therapy: Secondary | ICD-10-CM | POA: Diagnosis not present

## 2021-12-29 DIAGNOSIS — C49A5 Gastrointestinal stromal tumor of rectum: Secondary | ICD-10-CM | POA: Diagnosis not present

## 2021-12-29 LAB — CBC WITH DIFFERENTIAL (CANCER CENTER ONLY)
Abs Immature Granulocytes: 0.01 10*3/uL (ref 0.00–0.07)
Basophils Absolute: 0.1 10*3/uL (ref 0.0–0.1)
Basophils Relative: 1 %
Eosinophils Absolute: 0.1 10*3/uL (ref 0.0–0.5)
Eosinophils Relative: 1 %
HCT: 34.1 % — ABNORMAL LOW (ref 36.0–46.0)
Hemoglobin: 10.9 g/dL — ABNORMAL LOW (ref 12.0–15.0)
Immature Granulocytes: 0 %
Lymphocytes Relative: 21 %
Lymphs Abs: 1.9 10*3/uL (ref 0.7–4.0)
MCH: 25.8 pg — ABNORMAL LOW (ref 26.0–34.0)
MCHC: 32 g/dL (ref 30.0–36.0)
MCV: 80.8 fL (ref 80.0–100.0)
Monocytes Absolute: 0.7 10*3/uL (ref 0.1–1.0)
Monocytes Relative: 8 %
Neutro Abs: 6.2 10*3/uL (ref 1.7–7.7)
Neutrophils Relative %: 69 %
Platelet Count: 322 10*3/uL (ref 150–400)
RBC: 4.22 MIL/uL (ref 3.87–5.11)
RDW: 15.9 % — ABNORMAL HIGH (ref 11.5–15.5)
WBC Count: 8.9 10*3/uL (ref 4.0–10.5)
nRBC: 0 % (ref 0.0–0.2)

## 2021-12-29 LAB — CMP (CANCER CENTER ONLY)
ALT: 16 U/L (ref 0–44)
AST: 16 U/L (ref 15–41)
Albumin: 2.8 g/dL — ABNORMAL LOW (ref 3.5–5.0)
Alkaline Phosphatase: 86 U/L (ref 38–126)
Anion gap: 9 (ref 5–15)
BUN: 11 mg/dL (ref 8–23)
CO2: 22 mmol/L (ref 22–32)
Calcium: 8.7 mg/dL — ABNORMAL LOW (ref 8.9–10.3)
Chloride: 106 mmol/L (ref 98–111)
Creatinine: 0.76 mg/dL (ref 0.44–1.00)
GFR, Estimated: >60 mL/min (ref >60)
Glucose, Bld: 99 mg/dL (ref 70–99)
Potassium: 3.6 mmol/L (ref 3.5–5.1)
Sodium: 137 mmol/L (ref 135–145)
Total Bilirubin: 1.1 mg/dL (ref 0.3–1.2)
Total Protein: 5.8 g/dL — ABNORMAL LOW (ref 6.5–8.1)

## 2021-12-29 MED ORDER — DIPHENOXYLATE-ATROPINE 2.5-0.025 MG PO TABS: ORAL_TABLET | ORAL | 0 refills | Status: DC

## 2021-12-29 MED ORDER — DIPHENOXYLATE-ATROPINE 2.5-0.025 MG PO TABS
ORAL_TABLET | ORAL | 0 refills | Status: DC
  Filled 2021-12-29: qty 60, 7d supply, fill #0

## 2021-12-29 NOTE — Progress Notes (Signed)
[name] OFFICE PROGRESS NOTE   Diagnosis: Gastrointestinal stromal tumor  INTERVAL HISTORY:   Brianna Walker returns as scheduled.  She continues to ripretinib.  She has frequent small-volume bowel movements.  No diarrhea.  No mouth sores or rash.  She reports improvement in pain.  She is taking oxycodone infrequently.  She is getting out of the house more.  Her appetite has improved.  Objective:  Vital signs in last 24 hours:  Blood pressure (!) 130/90, pulse (!) 105, temperature 98.1 F (36.7 C), temperature source Oral, resp. rate 18, height '5\' 4"'  (1.626 m), weight 105 lb (47.6 kg), SpO2 100 %.    HEENT: Mild white coat over the tongue Resp: Scattered end inspiratory fine rales at the upper posterior chest bilaterally, no respiratory distress Cardio: Regular rate and rhythm GI: No hepatosplenomegaly, right lower abdominal hernia, nontender Vascular: No leg edema Skin: Palms without erythema  Lab Results:  Lab Results  Component Value Date   WBC 8.9 12/29/2021   HGB 10.9 (L) 12/29/2021   HCT 34.1 (L) 12/29/2021   MCV 80.8 12/29/2021   PLT 322 12/29/2021   NEUTROABS 6.2 12/29/2021    CMP  Lab Results  Component Value Date   NA 137 12/29/2021   K 3.6 12/29/2021   CL 106 12/29/2021   CO2 22 12/29/2021   GLUCOSE 99 12/29/2021   BUN 11 12/29/2021   CREATININE 0.76 12/29/2021   CALCIUM 8.7 (L) 12/29/2021   PROT 5.8 (L) 12/29/2021   ALBUMIN 2.8 (L) 12/29/2021   AST 16 12/29/2021   ALT 16 12/29/2021   ALKPHOS 86 12/29/2021   BILITOT 1.1 12/29/2021   GFRNONAA >60 12/29/2021   GFRAA >60 12/05/2019     Medications: I have reviewed the patient's current medications.   Assessment/Plan: Gastrointestinal stromal tumor of the rectum Status post an endoscopic ultrasound on 08/06/2014 confirming a mass abutting the distal rectal wall with an FNA biopsy confirming a gastrointestinal stromal tumor Initiation of Gleevec 09/04/2014 MRI pelvis 12/15/2014  with interval decreased size of the large anorectal mass with central necrosis. Continuation of Gleevec Gleevec placed on hold 06/02/2015 in anticipation of surgery 06/04/2015 status post low anterior resection and diverting loop ileostomy, resection of tumor Pathology: 9.5 cm tumor; GISTsubtype spindle; mitotic rate 1/50 high-power field; positive margin of resection; c-Kit mutation detected (Exon 13: c.[id number] (p.[id number])) Adjuvant Gleevec 08/19/2015 (three-year in total course planned) Ileostomy takedown 01/28/2016 Held 01/28/2016 through 02/14/2016, resumed 02/15/2016 Gleevec stop 04/11/2016-04/18/2016, and 04/26/2016-05/08/2016 Gleevec resumed at a dose of 200 mg daily on 05/09/2016 Gleevec dose increased to 300 mg daily beginning 06/20/2016 Gleevec dose decreased to 200 mg daily beginning 06/23/2016 (poor tolerance at 300 mg dose) CTs 11/22/2017- enhancing soft tissue mass in the presacral space measuring 5.4 x 3.5 cm.  Enlarged pelvic lymph nodes. Gleevec continued at a dose of 200 mg daily Restaging CTs 03/11/2018- interval decrease in size of the presacral soft tissue mass.  Stable small pelvic lymph nodes. Gleevec discontinued 03/13/2018 CT 11/14/2018- new 2.4 cm left pelvic nodule at the piriformis muscle, stable presacral lymph nodes, right abdominal wall hernia, ventral hernia Gleevec 200 mg daily 12/04/2018 CTs 03/31/2019 -decrease in size of soft tissue nodule deep left pelvis.  5 nodules in the sigmoid mesocolon stable to slightly larger.  Stable presacral soft tissue thickening.  No findings for new metastatic disease. CT abdomen/pelvis 09/01/2019-progressive presacral and left perirectal nodularity.  Additional mesenteric nodules along the sigmoid mesocolon and left pelvis unchanged. Gleevec dose escalated to  300 mg daily 09/02/2019 Gleevec dose escalated to 400 mg alternating with 300 mg beginning 09/16/2019 Gleevec 300 mg daily beginning 09/30/2019 (unable to tolerate 400 mg due to  diarrhea) CT 12/15/2019-pelvic/peritoneal nodularity-unchanged CT 06/02/2020-mildly worsened soft tissue density between the lower rectum and the sacrum. Mildly worsened superior anorectal adenopathy and perirectal adenopathy.  Office review of CT consistent with stable disease Gleevec 300 mg daily continued Gleevec dose reduced to 200 mg daily beginning 01/07/2021 (diarrhea) Gleevec placed on hold 01/27/2021 due to diarrhea CT abdomen/pelvis 03/04/2021-no irregular annular wall thickening in the remnant rectum contiguous with presacral space, compatible with recurrent tumor.  Small rectovaginal fistula identified.  Small amount of gas and rectal contrast in the vagina and vaginal introitus.  Interval progression of metastatic adenopathy in the left perirectal, lower left pelvic sidewall and high presacral chains. Sunitinib 37.5 mg daily beginning 04/04/2021 Sunitinib dose reduced to 25 mg daily 05/06/2021 due to diarrhea, mild neutropenia (she began the reduced dose on 05/15/2021) Sutent placed on hold 05/19/2021 due to diarrhea, weight loss, failure to thrive Sunitinib resumed 25 mg daily 05/26/2021 Sunitinib discontinued 05/30/2021 secondary to persistent diarrhea CTs 06/15/2021-similar appearance of annular wall thickening with luminal narrowing in the remnant rectum.  Previously noted small rectovaginal fistula again identified with gas noted within the lumen of the vagina.  Mild increase in the size of left perirectal left pelvic sidewall soft tissue nodules.  Large lobulated mass within the presacral soft tissue region not significantly changed in the interval. Regorafenib 06/22/2021-discontinued 07/01/2021 secondary to diarrhea Regorafenib resumed at a dose of 40 mg daily 07/08/2021 Regorafenib cycle completed 08/30/2021 Regorafenib cycle started 09/08/2021 Regorafenib placed on hold 09/29/2021 CTs 10/13/2021-increase size of left perianal, left perirectal and presacral soft tissue masses Ripretinib 10/22/2021 CT  abdomen/pelvis 12/24/2021- Remote history of pulmonary embolism-maintained on apixaban Multiple colon polyps noted on the colonoscopy 07/21/2014 with the pathology revealing tubular adenomas Hospitalization 06/21/2015 through 07/06/2015 with nausea/vomiting, high ileostomy output; found to have delayed gastric emptying. Water soluble contrast enema 08/13/2015 positive for a small to moderate volume of water soluble contrast leakage from the low anterior resection and anastomosis site in the pelvis Ileostomy takedown and resection of cystic abdominal wall mass ( benign pathology) on 01/28/2016 Anemia with ferritin in the low normal range 05/23/2016 and 06/20/2016. Ferrous sulfate increased to twice daily 06/26/2016. Improved.       Disposition: Brianna Walker has metastatic gastrointestinal stromal tumor.  She has been maintained on ripretinib for the past 2 months.  The restaging T is consistent with disease progression.  I reviewed the CT findings and images with Brianna Walker and her sister.  I measured the pelvic soft tissue masses.  The CT is consistent with disease progression.  She is losing weight.  Brianna Walker feels she is receiving clinical benefit from the ripretinib.  I recommend discontinuing ripretinib and enrolling in home hospice care.  Brianna Walker declines hospice.  She would like to continue ripretinib. It is possible the tumor would progress more rapidly if off of the ripretinib.  The plan is to continue ripretinib with close clinical follow-up.  We will continue to discuss hospice care.  His forms will return for an office and lab visit in 4 weeks.  She will contact us in the interim as needed.  Betsy Coder, MD  12/29/2021  12:38 PM

## 2022-01-05 ENCOUNTER — Other Ambulatory Visit: Payer: Self-pay | Admitting: *Deleted

## 2022-01-05 DIAGNOSIS — C49A4 Gastrointestinal stromal tumor of large intestine: Secondary | ICD-10-CM

## 2022-01-05 MED ORDER — PREDNISONE 10 MG PO TABS: 10.0000 mg | ORAL_TABLET | Freq: Every day | ORAL | 1 refills | Status: DC

## 2022-01-05 NOTE — Telephone Encounter (Signed)
Received faxed refill request from [facility] for Prednisone.

## 2022-01-06 ENCOUNTER — Other Ambulatory Visit (HOSPITAL_BASED_OUTPATIENT_CLINIC_OR_DEPARTMENT_OTHER): Payer: Self-pay

## 2022-01-06 ENCOUNTER — Other Ambulatory Visit: Payer: Self-pay | Admitting: Nurse Practitioner

## 2022-01-06 DIAGNOSIS — C49A4 Gastrointestinal stromal tumor of large intestine: Secondary | ICD-10-CM

## 2022-01-06 MED ORDER — RIPRETINIB 50 MG PO TABS: 150.0000 mg | ORAL_TABLET | Freq: Every day | ORAL | 0 refills | Status: DC

## 2022-01-06 MED ORDER — OXYCODONE HCL 5 MG PO TABS
5.0000 mg | ORAL_TABLET | ORAL | 0 refills | Status: DC | PRN
  Filled 2022-01-06: qty 60, 13d supply, fill #0

## 2022-01-06 NOTE — Telephone Encounter (Signed)
Received faxed refill request for [place] from [facility].

## 2022-01-09 ENCOUNTER — Other Ambulatory Visit (HOSPITAL_BASED_OUTPATIENT_CLINIC_OR_DEPARTMENT_OTHER): Payer: Self-pay

## 2022-01-16 ENCOUNTER — Other Ambulatory Visit: Payer: Self-pay | Admitting: Nurse Practitioner

## 2022-01-16 ENCOUNTER — Other Ambulatory Visit (HOSPITAL_BASED_OUTPATIENT_CLINIC_OR_DEPARTMENT_OTHER): Payer: Self-pay

## 2022-01-16 DIAGNOSIS — C49A4 Gastrointestinal stromal tumor of large intestine: Secondary | ICD-10-CM

## 2022-01-16 MED ORDER — MORPHINE SULFATE ER 30 MG PO TBCR
30.0000 mg | EXTENDED_RELEASE_TABLET | Freq: Two times a day (BID) | ORAL | 0 refills | Status: DC
  Filled 2022-01-16 – 2022-01-18 (×3): qty 60, 30d supply, fill #0

## 2022-01-17 ENCOUNTER — Other Ambulatory Visit (HOSPITAL_BASED_OUTPATIENT_CLINIC_OR_DEPARTMENT_OTHER): Payer: Self-pay

## 2022-01-18 ENCOUNTER — Other Ambulatory Visit: Payer: Self-pay | Admitting: Oncology

## 2022-01-18 ENCOUNTER — Other Ambulatory Visit (HOSPITAL_BASED_OUTPATIENT_CLINIC_OR_DEPARTMENT_OTHER): Payer: Self-pay

## 2022-01-18 ENCOUNTER — Other Ambulatory Visit: Payer: Self-pay | Admitting: Internal Medicine

## 2022-01-18 DIAGNOSIS — C49A4 Gastrointestinal stromal tumor of large intestine: Secondary | ICD-10-CM

## 2022-01-22 ENCOUNTER — Other Ambulatory Visit: Payer: Self-pay | Admitting: Internal Medicine

## 2022-01-22 NOTE — Telephone Encounter (Signed)
Please refill as per office routine med refill policy (all routine meds to be refilled for 3 mo or monthly (per pt preference) up to one year from last visit, then month to month grace period for 3 mo, then further med refills will have to be denied) ? ?

## 2022-01-24 ENCOUNTER — Other Ambulatory Visit: Payer: Self-pay | Admitting: Nurse Practitioner

## 2022-01-24 ENCOUNTER — Other Ambulatory Visit (HOSPITAL_BASED_OUTPATIENT_CLINIC_OR_DEPARTMENT_OTHER): Payer: Self-pay

## 2022-01-24 DIAGNOSIS — C49A4 Gastrointestinal stromal tumor of large intestine: Secondary | ICD-10-CM

## 2022-01-24 MED ORDER — OXYCODONE HCL 5 MG PO TABS
5.0000 mg | ORAL_TABLET | ORAL | 0 refills | Status: DC | PRN
  Filled 2022-01-24: qty 60, 5d supply, fill #0

## 2022-01-26 ENCOUNTER — Other Ambulatory Visit (HOSPITAL_BASED_OUTPATIENT_CLINIC_OR_DEPARTMENT_OTHER): Payer: Self-pay

## 2022-01-26 ENCOUNTER — Inpatient Hospital Stay: Payer: Medicare HMO | Attending: Oncology

## 2022-01-26 ENCOUNTER — Encounter: Payer: Self-pay | Admitting: Nurse Practitioner

## 2022-01-26 ENCOUNTER — Inpatient Hospital Stay (HOSPITAL_BASED_OUTPATIENT_CLINIC_OR_DEPARTMENT_OTHER): Payer: Medicare HMO | Admitting: Nurse Practitioner

## 2022-01-26 VITALS — BP 141/90 | HR 120 | Temp 98.1°F | Resp 18 | Ht 64.0 in | Wt 107.0 lb

## 2022-01-26 DIAGNOSIS — Z86711 Personal history of pulmonary embolism: Secondary | ICD-10-CM | POA: Insufficient documentation

## 2022-01-26 DIAGNOSIS — C49A4 Gastrointestinal stromal tumor of large intestine: Secondary | ICD-10-CM | POA: Diagnosis not present

## 2022-01-26 DIAGNOSIS — C49A5 Gastrointestinal stromal tumor of rectum: Secondary | ICD-10-CM | POA: Diagnosis not present

## 2022-01-26 DIAGNOSIS — Z79899 Other long term (current) drug therapy: Secondary | ICD-10-CM | POA: Diagnosis not present

## 2022-01-26 DIAGNOSIS — D649 Anemia, unspecified: Secondary | ICD-10-CM | POA: Diagnosis not present

## 2022-01-26 DIAGNOSIS — R000 Tachycardia, unspecified: Secondary | ICD-10-CM | POA: Insufficient documentation

## 2022-01-26 LAB — CMP (CANCER CENTER ONLY)
ALT: 16 U/L (ref 0–44)
AST: 18 U/L (ref 15–41)
Albumin: 2.6 g/dL — ABNORMAL LOW (ref 3.5–5.0)
Alkaline Phosphatase: 94 U/L (ref 38–126)
Anion gap: 8 (ref 5–15)
BUN: 13 mg/dL (ref 8–23)
CO2: 28 mmol/L (ref 22–32)
Calcium: 8.6 mg/dL — ABNORMAL LOW (ref 8.9–10.3)
Chloride: 102 mmol/L (ref 98–111)
Creatinine: 0.79 mg/dL (ref 0.44–1.00)
GFR, Estimated: >60 mL/min (ref >60)
Glucose, Bld: 82 mg/dL (ref 70–99)
Potassium: 3.3 mmol/L — ABNORMAL LOW (ref 3.5–5.1)
Sodium: 138 mmol/L (ref 135–145)
Total Bilirubin: 0.8 mg/dL (ref 0.3–1.2)
Total Protein: 6 g/dL — ABNORMAL LOW (ref 6.5–8.1)

## 2022-01-26 LAB — CBC WITH DIFFERENTIAL (CANCER CENTER ONLY)
Abs Immature Granulocytes: 0.02 10*3/uL (ref 0.00–0.07)
Basophils Absolute: 0 10*3/uL (ref 0.0–0.1)
Basophils Relative: 1 %
Eosinophils Absolute: 0.1 10*3/uL (ref 0.0–0.5)
Eosinophils Relative: 1 %
HCT: 36.3 % (ref 36.0–46.0)
Hemoglobin: 11.4 g/dL — ABNORMAL LOW (ref 12.0–15.0)
Immature Granulocytes: 0 %
Lymphocytes Relative: 21 %
Lymphs Abs: 1.5 10*3/uL (ref 0.7–4.0)
MCH: 25.3 pg — ABNORMAL LOW (ref 26.0–34.0)
MCHC: 31.4 g/dL (ref 30.0–36.0)
MCV: 80.7 fL (ref 80.0–100.0)
Monocytes Absolute: 0.6 10*3/uL (ref 0.1–1.0)
Monocytes Relative: 8 %
Neutro Abs: 5 10*3/uL (ref 1.7–7.7)
Neutrophils Relative %: 69 %
Platelet Count: 357 10*3/uL (ref 150–400)
RBC: 4.5 MIL/uL (ref 3.87–5.11)
RDW: 16.4 % — ABNORMAL HIGH (ref 11.5–15.5)
WBC Count: 7.2 10*3/uL (ref 4.0–10.5)
nRBC: 0 % (ref 0.0–0.2)

## 2022-01-26 LAB — MAGNESIUM: Magnesium: 1.6 mg/dL — ABNORMAL LOW (ref 1.7–2.4)

## 2022-01-26 MED ORDER — INFLUENZA VAC A&B SA ADJ QUAD 0.5 ML IM PRSY
PREFILLED_SYRINGE | INTRAMUSCULAR | 0 refills | Status: DC
  Filled 2022-01-26: qty 0.5, 1d supply, fill #0

## 2022-01-26 NOTE — Progress Notes (Signed)
[facility] OFFICE PROGRESS NOTE   Diagnosis: Gastrointestinal stromal tumor  INTERVAL HISTORY:   Brianna Walker returns as scheduled.  She continues ripretinib.  She continues to feel better.  Energy level and appetite both improved.  She is excited she has gained 2 pounds.  Rectal pain has improved.  She is taking less pain medication.  She denies nausea/vomiting.  No mouth sores.  No diarrhea.  No rash.  No unusual headaches.  No diplopia.  She denies fever, cough, shortness of breath.  Objective:  Vital signs in last 24 hours:  Blood pressure (!) 141/90, pulse (!) 120, temperature 98.1 F (36.7 C), temperature source Oral, resp. rate 18, height _0  (1.626 m), weight 107 lb (48.5 kg), SpO2 100 %.    HEENT: No thrush or ulcers. Resp: Lungs clear bilaterally. Cardio: Regular rate and rhythm. GI: Abdomen soft and nontender.  No hepatosplenomegaly.  Right lower abdominal hernia.  Nontender. Vascular: No leg edema. Neuro: Alert and oriented. Skin: Mild decrease in skin turgor.   Lab Results:  Lab Results  Component Value Date   WBC 7.2 01/26/2022   HGB 11.4 (L) 01/26/2022   HCT 36.3 01/26/2022   MCV 80.7 01/26/2022   PLT 357 01/26/2022   NEUTROABS 5.0 01/26/2022    Imaging:  No results found.  Medications: I have reviewed the patient's current medications.  Assessment/Plan: Gastrointestinal stromal tumor of the rectum Status post an endoscopic ultrasound on 08/06/2014 confirming a mass abutting the distal rectal wall with an FNA biopsy confirming a gastrointestinal stromal tumor Initiation of Gleevec 09/04/2014 MRI pelvis 12/15/2014 with interval decreased size of the large anorectal mass with central necrosis. Continuation of Gleevec Gleevec placed on hold 06/02/2015 in anticipation of surgery 06/04/2015 status post low anterior resection and diverting loop ileostomy, resection of tumor Pathology: 9.5 cm tumor; GISTsubtype spindle; mitotic rate 1/50  high-power field; positive margin of resection; c-Kit mutation detected (Exon 13: c.[id number] (p.K642E)) Adjuvant Gleevec 08/19/2015 (three-year in total course planned) Ileostomy takedown 01/28/2016 Held 01/28/2016 through 02/14/2016, resumed 02/15/2016 Gleevec stop 04/11/2016-04/18/2016, and 04/26/2016-05/08/2016 Gleevec resumed at a dose of 200 mg daily on 05/09/2016 Gleevec dose increased to 300 mg daily beginning 06/20/2016 Gleevec dose decreased to 200 mg daily beginning 06/23/2016 (poor tolerance at 300 mg dose) CTs 11/22/2017- enhancing soft tissue mass in the presacral space measuring 5.4 x 3.5 cm.  Enlarged pelvic lymph nodes. Gleevec continued at a dose of 200 mg daily Restaging CTs 03/11/2018- interval decrease in size of the presacral soft tissue mass.  Stable small pelvic lymph nodes. Gleevec discontinued 03/13/2018 CT 11/14/2018- new 2.4 cm left pelvic nodule at the piriformis muscle, stable presacral lymph nodes, right abdominal wall hernia, ventral hernia Gleevec 200 mg daily 12/04/2018 CTs 03/31/2019 -decrease in size of soft tissue nodule deep left pelvis.  5 nodules in the sigmoid mesocolon stable to slightly larger.  Stable presacral soft tissue thickening.  No findings for new metastatic disease. CT abdomen/pelvis 09/01/2019-progressive presacral and left perirectal nodularity.  Additional mesenteric nodules along the sigmoid mesocolon and left pelvis unchanged. Gleevec dose escalated to 300 mg daily 09/02/2019 Gleevec dose escalated to 400 mg alternating with 300 mg beginning 09/16/2019 Gleevec 300 mg daily beginning 09/30/2019 (unable to tolerate 400 mg due to diarrhea) CT 12/15/2019-pelvic/peritoneal nodularity-unchanged CT 06/02/2020-mildly worsened soft tissue density between the lower rectum and the sacrum. Mildly worsened superior anorectal adenopathy and perirectal adenopathy.  Office review of CT consistent with stable disease Gleevec 300 mg daily continued Gleevec dose  reduced  to 200 mg daily beginning 01/07/2021 (diarrhea) Gleevec placed on hold 01/27/2021 due to diarrhea CT abdomen/pelvis 03/04/2021-no irregular annular wall thickening in the remnant rectum contiguous with presacral space, compatible with recurrent tumor.  Small rectovaginal fistula identified.  Small amount of gas and rectal contrast in the vagina and vaginal introitus.  Interval progression of metastatic adenopathy in the left perirectal, lower left pelvic sidewall and high presacral chains. Sunitinib 37.5 mg daily beginning 04/04/2021 Sunitinib dose reduced to 25 mg daily 05/06/2021 due to diarrhea, mild neutropenia (she began the reduced dose on 05/15/2021) Sutent placed on hold 05/19/2021 due to diarrhea, weight loss, failure to thrive Sunitinib resumed 25 mg daily 05/26/2021 Sunitinib discontinued 05/30/2021 secondary to persistent diarrhea CTs 06/15/2021-similar appearance of annular wall thickening with luminal narrowing in the remnant rectum.  Previously noted small rectovaginal fistula again identified with gas noted within the lumen of the vagina.  Mild increase in the size of left perirectal left pelvic sidewall soft tissue nodules.  Large lobulated mass within the presacral soft tissue region not significantly changed in the interval. Regorafenib 06/22/2021-discontinued 07/01/2021 secondary to diarrhea Regorafenib resumed at a dose of 40 mg daily 07/08/2021 Regorafenib cycle completed 08/30/2021 Regorafenib cycle started 09/08/2021 Regorafenib placed on hold 09/29/2021 CTs 10/13/2021-increase size of left perianal, left perirectal and presacral soft tissue masses Ripretinib 10/22/2021 CT abdomen/pelvis 12/24/2021-interval increase in size of the index lesions within the presacral, left perianal and left perirectal regions.  Stable enlarged left posterior pelvic sidewall lymph node. Repretinib continued Remote history of pulmonary embolism-maintained on apixaban Multiple colon polyps noted on the colonoscopy  07/21/2014 with the pathology revealing tubular adenomas Hospitalization 06/21/2015 through 07/06/2015 with nausea/vomiting, high ileostomy output; found to have delayed gastric emptying. Water soluble contrast enema 08/13/2015 positive for a small to moderate volume of water soluble contrast leakage from the low anterior resection and anastomosis site in the pelvis Ileostomy takedown and resection of cystic abdominal wall mass ( benign pathology) on 01/28/2016 Anemia with ferritin in the low normal range 05/23/2016 and 06/20/2016. Ferrous sulfate increased to twice daily 06/26/2016. Improved.      Disposition: Brianna Walker appears stable.  Her performance status is better.  Pain has improved.  She will continue ripretinib.  She again has tachycardia.  We discussed the likelihood that there is a component of dehydration.  She declines IV fluids.  She will return for lab and follow-up in 4 weeks.  We are available to see her sooner if needed.    Ned Card ANP/GNP-BC   01/26/2022  10:01 AM

## 2022-01-27 ENCOUNTER — Telehealth: Payer: Self-pay

## 2022-01-27 NOTE — Telephone Encounter (Signed)
Called and spoke to patient regarding message below.  Patient stated she had been taking just one potassium capsule once daily.  Instructed patient to take potassium as prescribed (two capsules once daily). Patient verbalized understanding.  Patient confirmed she has been taking her magnesium as prescribed. Ned Card, NP made aware of patient's response. No new orders at this time.

## 2022-01-27 NOTE — Telephone Encounter (Signed)
-----   Message from Owens Shark, NP sent at 01/27/2022 11:14 AM EDT ----- Please let her know potassium and magnesium are mildly decreased.  Is she taking oral potassium?

## 2022-02-06 ENCOUNTER — Other Ambulatory Visit (HOSPITAL_BASED_OUTPATIENT_CLINIC_OR_DEPARTMENT_OTHER): Payer: Self-pay

## 2022-02-06 ENCOUNTER — Other Ambulatory Visit: Payer: Self-pay | Admitting: Nurse Practitioner

## 2022-02-06 DIAGNOSIS — C49A4 Gastrointestinal stromal tumor of large intestine: Secondary | ICD-10-CM

## 2022-02-07 ENCOUNTER — Other Ambulatory Visit (HOSPITAL_BASED_OUTPATIENT_CLINIC_OR_DEPARTMENT_OTHER): Payer: Self-pay

## 2022-02-07 ENCOUNTER — Other Ambulatory Visit: Payer: Self-pay | Admitting: *Deleted

## 2022-02-07 ENCOUNTER — Other Ambulatory Visit: Payer: Self-pay | Admitting: Nurse Practitioner

## 2022-02-07 DIAGNOSIS — C49A4 Gastrointestinal stromal tumor of large intestine: Secondary | ICD-10-CM

## 2022-02-07 MED ORDER — OXYCODONE HCL 5 MG PO TABS
5.0000 mg | ORAL_TABLET | ORAL | 0 refills | Status: DC | PRN
  Filled 2022-02-07: qty 60, 5d supply, fill #0

## 2022-02-07 MED ORDER — RIPRETINIB 50 MG PO TABS: 150.0000 mg | ORAL_TABLET | Freq: Every day | ORAL | 0 refills | Status: DC

## 2022-02-08 ENCOUNTER — Other Ambulatory Visit (HOSPITAL_BASED_OUTPATIENT_CLINIC_OR_DEPARTMENT_OTHER): Payer: Self-pay

## 2022-02-10 ENCOUNTER — Other Ambulatory Visit: Payer: Self-pay | Admitting: Nurse Practitioner

## 2022-02-10 ENCOUNTER — Other Ambulatory Visit (HOSPITAL_BASED_OUTPATIENT_CLINIC_OR_DEPARTMENT_OTHER): Payer: Self-pay

## 2022-02-10 DIAGNOSIS — C49A4 Gastrointestinal stromal tumor of large intestine: Secondary | ICD-10-CM

## 2022-02-10 NOTE — Telephone Encounter (Signed)
Refill requested early. Will refill on 02/13/22

## 2022-02-13 ENCOUNTER — Other Ambulatory Visit: Payer: Self-pay | Admitting: Nurse Practitioner

## 2022-02-13 ENCOUNTER — Other Ambulatory Visit (HOSPITAL_BASED_OUTPATIENT_CLINIC_OR_DEPARTMENT_OTHER): Payer: Self-pay

## 2022-02-13 DIAGNOSIS — C49A4 Gastrointestinal stromal tumor of large intestine: Secondary | ICD-10-CM

## 2022-02-13 MED ORDER — MORPHINE SULFATE ER 30 MG PO TBCR
30.0000 mg | EXTENDED_RELEASE_TABLET | Freq: Two times a day (BID) | ORAL | 0 refills | Status: DC
  Filled 2022-02-13 – 2022-02-15 (×2): qty 60, 30d supply, fill #0

## 2022-02-15 ENCOUNTER — Other Ambulatory Visit (HOSPITAL_BASED_OUTPATIENT_CLINIC_OR_DEPARTMENT_OTHER): Payer: Self-pay

## 2022-02-21 ENCOUNTER — Other Ambulatory Visit: Payer: Self-pay | Admitting: Nurse Practitioner

## 2022-02-21 ENCOUNTER — Other Ambulatory Visit (HOSPITAL_BASED_OUTPATIENT_CLINIC_OR_DEPARTMENT_OTHER): Payer: Self-pay

## 2022-02-21 DIAGNOSIS — C49A4 Gastrointestinal stromal tumor of large intestine: Secondary | ICD-10-CM

## 2022-02-21 MED ORDER — OXYCODONE HCL 5 MG PO TABS
5.0000 mg | ORAL_TABLET | ORAL | 0 refills | Status: DC | PRN
  Filled 2022-02-21: qty 60, 5d supply, fill #0

## 2022-02-22 ENCOUNTER — Other Ambulatory Visit (HOSPITAL_BASED_OUTPATIENT_CLINIC_OR_DEPARTMENT_OTHER): Payer: Self-pay

## 2022-02-23 ENCOUNTER — Inpatient Hospital Stay: Payer: Medicare HMO | Attending: Oncology

## 2022-02-23 ENCOUNTER — Other Ambulatory Visit: Payer: Self-pay

## 2022-02-23 ENCOUNTER — Inpatient Hospital Stay: Payer: Medicare HMO

## 2022-02-23 ENCOUNTER — Inpatient Hospital Stay (HOSPITAL_BASED_OUTPATIENT_CLINIC_OR_DEPARTMENT_OTHER): Payer: Medicare HMO | Admitting: Nurse Practitioner

## 2022-02-23 ENCOUNTER — Encounter: Payer: Self-pay | Admitting: Nurse Practitioner

## 2022-02-23 VITALS — BP 100/62 | HR 114 | Temp 98.6°F | Resp 18 | Wt 101.8 lb

## 2022-02-23 DIAGNOSIS — D649 Anemia, unspecified: Secondary | ICD-10-CM | POA: Diagnosis not present

## 2022-02-23 DIAGNOSIS — R000 Tachycardia, unspecified: Secondary | ICD-10-CM | POA: Diagnosis not present

## 2022-02-23 DIAGNOSIS — E860 Dehydration: Secondary | ICD-10-CM | POA: Diagnosis not present

## 2022-02-23 DIAGNOSIS — C49A5 Gastrointestinal stromal tumor of rectum: Secondary | ICD-10-CM | POA: Diagnosis not present

## 2022-02-23 DIAGNOSIS — Z79899 Other long term (current) drug therapy: Secondary | ICD-10-CM | POA: Insufficient documentation

## 2022-02-23 DIAGNOSIS — C49A4 Gastrointestinal stromal tumor of large intestine: Secondary | ICD-10-CM

## 2022-02-23 LAB — CMP (CANCER CENTER ONLY)
ALT: 19 U/L (ref 0–44)
AST: 19 U/L (ref 15–41)
Albumin: 2.4 g/dL — ABNORMAL LOW (ref 3.5–5.0)
Alkaline Phosphatase: 139 U/L — ABNORMAL HIGH (ref 38–126)
Anion gap: 10 (ref 5–15)
BUN: 14 mg/dL (ref 8–23)
CO2: 25 mmol/L (ref 22–32)
Calcium: 8.4 mg/dL — ABNORMAL LOW (ref 8.9–10.3)
Chloride: 106 mmol/L (ref 98–111)
Creatinine: 1.03 mg/dL — ABNORMAL HIGH (ref 0.44–1.00)
GFR, Estimated: 57 mL/min — ABNORMAL LOW (ref >60)
Glucose, Bld: 115 mg/dL — ABNORMAL HIGH (ref 70–99)
Potassium: 3.9 mmol/L (ref 3.5–5.1)
Sodium: 141 mmol/L (ref 135–145)
Total Bilirubin: 0.9 mg/dL (ref 0.3–1.2)
Total Protein: 6.2 g/dL — ABNORMAL LOW (ref 6.5–8.1)

## 2022-02-23 LAB — CBC WITH DIFFERENTIAL (CANCER CENTER ONLY)
Abs Immature Granulocytes: 0.06 10*3/uL (ref 0.00–0.07)
Basophils Absolute: 0.1 10*3/uL (ref 0.0–0.1)
Basophils Relative: 1 %
Eosinophils Absolute: 0 10*3/uL (ref 0.0–0.5)
Eosinophils Relative: 0 %
HCT: 40.7 % (ref 36.0–46.0)
Hemoglobin: 12.5 g/dL (ref 12.0–15.0)
Immature Granulocytes: 1 %
Lymphocytes Relative: 23 %
Lymphs Abs: 2.8 10*3/uL (ref 0.7–4.0)
MCH: 24.9 pg — ABNORMAL LOW (ref 26.0–34.0)
MCHC: 30.7 g/dL (ref 30.0–36.0)
MCV: 81.1 fL (ref 80.0–100.0)
Monocytes Absolute: 0.7 10*3/uL (ref 0.1–1.0)
Monocytes Relative: 6 %
Neutro Abs: 8.3 10*3/uL — ABNORMAL HIGH (ref 1.7–7.7)
Neutrophils Relative %: 69 %
Platelet Count: 464 10*3/uL — ABNORMAL HIGH (ref 150–400)
RBC: 5.02 MIL/uL (ref 3.87–5.11)
RDW: 17.5 % — ABNORMAL HIGH (ref 11.5–15.5)
WBC Count: 12 10*3/uL — ABNORMAL HIGH (ref 4.0–10.5)
nRBC: 0 % (ref 0.0–0.2)

## 2022-02-23 MED ORDER — SODIUM CHLORIDE 0.9 % IV SOLN: INTRAVENOUS | Status: AC

## 2022-02-23 MED ORDER — OXYCODONE HCL 5 MG PO TABS
15.0000 mg | ORAL_TABLET | Freq: Once | ORAL | Status: AC
  Administered 2022-02-23: 15 mg via ORAL
  Filled 2022-02-23: qty 3

## 2022-02-23 NOTE — Patient Instructions (Signed)
Dehydration, Adult Dehydration is a condition in which there is not enough water or other fluids in the body. This happens when a person loses more fluids than he or she takes in. Important organs, such as the kidneys, brain, and heart, cannot function without a proper amount of fluids. Any loss of fluids from the body can lead to dehydration. Dehydration can be mild, moderate, or severe. It should be treated right away to prevent it from becoming severe. What are the causes? Dehydration may be caused by: Conditions that cause loss of water or other fluids, such as diarrhea, vomiting, or sweating or urinating a lot. Not drinking enough fluids, especially when you are ill or doing activities that require a lot of energy. Other illnesses and conditions, such as fever or infection. Certain medicines, such as medicines that remove excess fluid from the body (diuretics). Lack of safe drinking water. Not being able to get enough water and food. What increases the risk? The following factors may make you more likely to develop this condition: Having a long-term (chronic) illness that has not been treated properly, such as diabetes, heart disease, or kidney disease. Being 65 years of age or older. Having a disability. Living in a place that is high in altitude, where thinner, drier air causes more fluid loss. Doing exercises that put stress on your body for a long time (endurance sports). What are the signs or symptoms? Symptoms of dehydration depend on how severe it is. Mild or moderate dehydration Thirst. Dry lips or dry mouth. Dizziness or light-headedness, especially when standing up from a seated position. Muscle cramps. Dark urine. Urine may be the color of tea. Less urine or tears produced than usual. Headache. Severe dehydration Changes in skin. Your skin may be cold and clammy, blotchy, or pale. Your skin also may not return to normal after being lightly pinched and released. Little or  no tears, urine, or sweat. Changes in vital signs, such as rapid breathing and low blood pressure. Your pulse may be weak or may be faster than 100 beats a minute when you are sitting still. Other changes, such as: Feeling very thirsty. Sunken eyes. Cold hands and feet. Confusion. Being very tired (lethargic) or having trouble waking from sleep. Short-term weight loss. Loss of consciousness. How is this diagnosed? This condition is diagnosed based on your symptoms and a physical exam. You may have blood and urine tests to help confirm the diagnosis. How is this treated? Treatment for this condition depends on how severe it is. Treatment should be started right away. Do not wait until dehydration becomes severe. Severe dehydration is an emergency and needs to be treated in a hospital. Mild or moderate dehydration can be treated at home. You may be asked to: Drink more fluids. Drink an oral rehydration solution (ORS). This drink helps restore proper amounts of fluids and salts and minerals in the blood (electrolytes). Severe dehydration can be treated: With IV fluids. By correcting abnormal levels of electrolytes. This is often done by giving electrolytes through a tube that is passed through your nose and into your stomach (nasogastric tube, or NG tube). By treating the underlying cause of dehydration. Follow these instructions at home: Oral rehydration solution If told by your health care provider, drink an ORS: Make an ORS by following instructions on the package. Start by drinking small amounts, about  cup (120 mL) every 5-10 minutes. Slowly increase how much you drink until you have taken the amount recommended by your health   care provider. Eating and drinking        Drink enough clear fluid to keep your urine pale yellow. If you were told to drink an ORS, finish the ORS first and then start slowly drinking other clear fluids. Drink fluids such as: Water. Do not drink only  water. Doing that can lead to hyponatremia, which is having too little salt (sodium) in the body. Water from ice chips you suck on. Fruit juice that you have added water to (diluted fruit juice). Low-calorie sports drinks. Eat foods that contain a healthy balance of electrolytes, such as bananas, oranges, potatoes, tomatoes, and spinach. Do not drink alcohol. Avoid the following: Drinks that contain a lot of sugar. These include high-calorie sports drinks, fruit juice that is not diluted, and soda. Caffeine. Foods that are greasy or contain a lot of fat or sugar. General instructions Take over-the-counter and prescription medicines only as told by your health care provider. Do not take sodium tablets. Doing that can lead to having too much sodium in the body (hypernatremia). Return to your normal activities as told by your health care provider. Ask your health care provider what activities are safe for you. Keep all follow-up visits as told by your health care provider. This is important. Contact a health care provider if: You have muscle cramps, pain, or discomfort, such as: Pain in your abdomen and the pain gets worse or stays in one area (localizes). Stiff neck. You have a rash. You are more irritable than usual. You are sleepier or have a harder time waking than usual. You feel weak or dizzy. You feel very thirsty. Get help right away if you have: Any symptoms of severe dehydration. Symptoms of vomiting, such as: You cannot eat or drink without vomiting. Vomiting gets worse or does not go away. Vomit includes blood or green matter (bile). Symptoms that get worse with treatment. A fever. A severe headache. Problems with urination or bowel movements, such as: Diarrhea that gets worse or does not go away. Blood in your stool (feces). This may cause stool to look black and tarry. Not urinating, or urinating only a small amount of very dark urine, within 6-8 hours. Trouble  breathing. These symptoms may represent a serious problem that is an emergency. Do not wait to see if the symptoms will go away. Get medical help right away. Call your local emergency services (911 in the [place] Do not drive yourself to the hospital. Summary Dehydration is a condition in which there is not enough water or other fluids in the body. This happens when a person loses more fluids than he or she takes in. Treatment for this condition depends on how severe it is. Treatment should be started right away. Do not wait until dehydration becomes severe. Drink enough clear fluid to keep your urine pale yellow. If you were told to drink an oral rehydration solution (ORS), finish the ORS first and then start slowly drinking other clear fluids. Take over-the-counter and prescription medicines only as told by your health care provider. Get help right away if you have any symptoms of severe dehydration. This information is not intended to replace advice given to you by your health care provider. Make sure you discuss any questions you have with your health care provider. Document Revised: 08/24/2021 Document Reviewed: 11/28/2018 Elsevier Patient Education  [employer]. Oxycodone Capsules or Tablets What is this medication? OXYCODONE (ox i KOE done) treats severe pain. It is prescribed when other pain medications do  not work well enough or cannot be tolerated. It works by blocking pain signals in the brain. It belongs to a group of medications called opioids. This medicine may be used for other purposes; ask your health care provider or pharmacist if you have questions. COMMON BRAND NAME(S): Dazidox, Endocodone, Oxaydo, OXECTA, OxyIR, Percolone, Roxicodone, Roxybond What should I tell my care team before I take this medication? They need to know if you have any of these conditions: Brain tumor Drug abuse or addiction Head injury Heart disease History of pancreatitis If you often drink  alcohol Kidney disease Liver disease Low adrenal gland function Lung disease, asthma, or breathing problem Seizures Stomach or intestine problems Taken an MAOI such as Marplan, Nardil, or Parnate in the last 14 days An unusual or allergic reaction to oxycodone, other medications, foods, dyes, or preservatives Pregnant or trying to get pregnant Breast-feeding How should I use this medication? Take this medication by mouth with water. Take it as directed on the prescription label at the same time every day. You can take it with or without food. If it upsets your stomach, take it with food. Keep taking it unless your care team tells you to stop. Some brands of this medication, like Oxaydo, have special instructions. Ask your care team or pharmacist if these directions are for you: Do not cut, crush or chew this medication. Do not wet, soak, or lick the tablet before you take it. A special MedGuide will be given to you by the pharmacist with each prescription and refill. Be sure to read this information carefully each time. Talk to your care team regarding the use of this medication in children. Special care may be needed. Overdosage: If you think you have taken too much of this medicine contact a poison control center or emergency room at once. NOTE: This medicine is only for you. Do not share this medicine with others. What if I miss a dose? If you miss a dose, take it as soon as you can. If it is almost time for your next dose, take only that dose. Do not take double or extra doses. What may interact with this medication? Do not take this medication with any of the following: Safinamide This medication may interact with the following: Alcohol Antihistamines for allergy, cough, and cold Atropine Certain antivirals for HIV or hepatitis Certain antibiotics like clarithromycin, erythromycin, linezolid, rifampin Certain medications for anxiety or sleep Certain medications for bladder problems  like oxybutynin, tolterodine Certain medications for depression like amitriptyline, fluoxetine, sertraline Certain medications for fungal infections like ketoconazole, itraconazole, posaconazole Certain medications for migraine headache like almotriptan, eletriptan, frovatriptan, naratriptan, rizatriptan, sumatriptan, zolmitriptan Certain medications for nausea or vomiting like dolasetron, granisetron, ondansetron, palonosetron Certain medications for Parkinson's disease like benztropine, trihexyphenidyl Certain medications for seizures like carbamazepine, phenobarbital, phenytoin, primidone Certain medications for stomach problems like dicyclomine, hyoscyamine Certain medications for travel sickness like scopolamine Diuretics General anesthetics like halothane, isoflurane, methoxyflurane, propofol Ipratropium MAOIs like Marplan, Nardil, and Parnate Medications that relax muscles Methylene blue Other narcotic medications for pain or cough Phenothiazines like chlorpromazine, mesoridazine, prochlorperazine, thioridazine This list may not describe all possible interactions. Give your health care provider a list of all the medicines, herbs, non-prescription drugs, or dietary supplements you use. Also tell them if you smoke, drink alcohol, or use illegal drugs. Some items may interact with your medicine. What should I watch for while using this medication? Tell your care team if your pain does not go away, if it  gets worse, or if you have new or a different type of pain. You may develop tolerance to this medication. Tolerance means that you will need a higher dose of the medication for pain relief. Tolerance is normal and is expected if you take this medication for a long time. Taking this medication with other substances that cause drowsiness, such as alcohol, benzodiazepines, or other opioids can cause serious side effects. Give your care team a list of all medications you use. They will tell you  how much medication to take. Do not take more medication than directed. Call emergency services if you have problems breathing or staying awake. Long term use of this medication may cause your brain and body to depend on it. This can happen even when used as directed by your care team. You and your care team will work together to determine how long you will need to take this medication. If your care team wants you to stop this medication, the dose will be slowly lowered over time to reduce the risk of side effects. Naloxone is an emergency medication used for an opioid overdose. An overdose can happen if you take too much of an opioid. It can also happen if an opioid is taken with some other medications or substances such as alcohol. Know the symptoms of an overdose, such as trouble breathing, unusually tired or sleepy, or not being able to respond or wake up. Make sure to tell caregivers and close contacts where your naloxone is stored. Make sure they know how to use it. After naloxone is given, the person giving it must call emergency services. Naloxone is a temporary treatment. Repeat doses may be needed. This medication may affect your coordination, reaction time, or judgment. Do not drive or operate machinery until you know how this medication affects you. Sit up or stand slowly to reduce the risk of dizzy or fainting spells. Drinking alcohol with this medication can increase the risk of these side effects. This medication will cause constipation. If you do not have a bowel movement for 3 days, call your care team. Your mouth may get dry. Chewing sugarless gum or sucking hard candy and drinking plenty of water may help. Contact your care team if the problem does not go away or is severe. Talk to your care team if you are pregnant or think you might be pregnant. This medication can cause serious birth defects if taken during pregnancy. Prolonged use of this medication during pregnancy can cause withdrawal  in a newborn. Talk to your care team before breastfeeding. Changes to your treatment plan may be needed. If you breastfeed while taking this medication, seek medical care right away if you notice the child has slow or noisy breathing, is unusually sleepy or not able to wake up, or is limp. Long-term use of this medication may cause infertility. Talk to your care team if you are concerned about your fertility. What side effects may I notice from receiving this medication? Side effects that you should report to your care team as soon as possible: Allergic reactions--skin rash, itching, hives, swelling of the face, lips, tongue, or throat CNS depression--slow or shallow breathing, shortness of breath, feeling faint, dizziness, confusion, difficulty staying awake Low adrenal gland function--nausea, vomiting, loss of appetite, unusual weakness or fatigue, dizziness Low blood pressure--dizziness, feeling faint or lightheaded, blurry vision Side effects that usually do not require medical attention (report to your care team if they continue or are bothersome): Constipation Dizziness Drowsiness Dry mouth  Headache Nausea Vomiting This list may not describe all possible side effects. Call your doctor for medical advice about side effects. You may report side effects to FDA at 1-800-FDA-1088. Where should I keep my medication? Keep out of the reach of children and pets. This medication can be abused. Keep it in a safe place to protect it from theft. Do not share it with anyone. It is only for you. Selling or giving away this medication is dangerous and against the law. Store at [employer] C (77 degrees F). Protect from light and moisture. Keep the container tightly closed. Get rid of any unused medication after the expiration date. This medication may cause harm and death if it is taken by other adults, children, or pets. It is important to get rid of the medication as soon as you no longer need it, or it  is expired. You can do this in two ways: Take the medication to a medication take-back program. Check with your pharmacy or law enforcement to find a location. If you cannot return the medication, flush it down the toilet. NOTE: This sheet is a summary. It may not cover all possible information. If you have questions about this medicine, talk to your doctor, pharmacist, or health care provider.  2023 Elsevier/Gold Standard (2020-04-06 00:00:00)

## 2022-02-23 NOTE — Progress Notes (Signed)
[facility] OFFICE PROGRESS NOTE   Diagnosis: Gastrointestinal stromal tumor  INTERVAL HISTORY:   Brianna Walker returns as scheduled.  She continues ripretinib.  She denies nausea/vomiting.  She is having frequent bowel movements but reports stool is formed.  She occasionally notices bright red blood with straining for a bowel movement.  No rash.  Pain overall is well controlled.  No unusual headaches.  No visual disturbance.  Oral intake is poor.  She denies shortness of breath.  No chest pain.  No lightheadedness or dizziness.  She does feel weak.  Objective:  Vital signs in last 24 hours:  Blood pressure 102/75, pulse (!) 116, temperature 98.2 F (36.8 C), temperature source Tympanic, resp. rate 18, weight 101 lb 12.8 oz (46.2 kg), SpO2 100 %.    HEENT: No thrush.  Mucous membranes appear moist. Resp: Lungs clear bilaterally. Cardio: Heart is regular with marked tachycardia. GI: Abdomen soft and nontender.  No hepatosplenomegaly.  Right lower abdominal hernia. Vascular: No leg edema. Neuro: Alert and oriented. Skin: Decreased skin turgor.   Lab Results:  Lab Results  Component Value Date   WBC 12.0 (H) 02/23/2022   HGB 12.5 02/23/2022   HCT 40.7 02/23/2022   MCV 81.1 02/23/2022   PLT 464 (H) 02/23/2022   NEUTROABS 8.3 (H) 02/23/2022    Imaging:  No results found.  Medications: I have reviewed the patient's current medications.  Assessment/Plan: Gastrointestinal stromal tumor of the rectum Status post an endoscopic ultrasound on 08/06/2014 confirming a mass abutting the distal rectal wall with an FNA biopsy confirming a gastrointestinal stromal tumor Initiation of Gleevec 09/04/2014 MRI pelvis 12/15/2014 with interval decreased size of the large anorectal mass with central necrosis. Continuation of Gleevec Gleevec placed on hold 06/02/2015 in anticipation of surgery 06/04/2015 status post low anterior resection and diverting loop ileostomy,  resection of tumor Pathology: 9.5 cm tumor; GISTsubtype spindle; mitotic rate 1/50 high-power field; positive margin of resection; c-Kit mutation detected (Exon 13: [id number] ([id number])) Adjuvant Gleevec 08/19/2015 (three-year in total course planned) Ileostomy takedown 01/28/2016 Held 01/28/2016 through 02/14/2016, resumed 02/15/2016 Gleevec stop 04/11/2016-04/18/2016, and 04/26/2016-05/08/2016 Gleevec resumed at a dose of 200 mg daily on 05/09/2016 Gleevec dose increased to 300 mg daily beginning 06/20/2016 Gleevec dose decreased to 200 mg daily beginning 06/23/2016 (poor tolerance at 300 mg dose) CTs 11/22/2017- enhancing soft tissue mass in the presacral space measuring 5.4 x 3.5 cm.  Enlarged pelvic lymph nodes. Gleevec continued at a dose of 200 mg daily Restaging CTs 03/11/2018- interval decrease in size of the presacral soft tissue mass.  Stable small pelvic lymph nodes. Gleevec discontinued 03/13/2018 CT 11/14/2018- new 2.4 cm left pelvic nodule at the piriformis muscle, stable presacral lymph nodes, right abdominal wall hernia, ventral hernia Gleevec 200 mg daily 12/04/2018 CTs 03/31/2019 -decrease in size of soft tissue nodule deep left pelvis.  5 nodules in the sigmoid mesocolon stable to slightly larger.  Stable presacral soft tissue thickening.  No findings for new metastatic disease. CT abdomen/pelvis 09/01/2019-progressive presacral and left perirectal nodularity.  Additional mesenteric nodules along the sigmoid mesocolon and left pelvis unchanged. Gleevec dose escalated to 300 mg daily 09/02/2019 Gleevec dose escalated to 400 mg alternating with 300 mg beginning 09/16/2019 Gleevec 300 mg daily beginning 09/30/2019 (unable to tolerate 400 mg due to diarrhea) CT 12/15/2019-pelvic/peritoneal nodularity-unchanged CT 06/02/2020-mildly worsened soft tissue density between the lower rectum and the sacrum. Mildly worsened superior anorectal adenopathy and perirectal adenopathy.  Office review of CT  consistent with stable  disease Gleevec 300 mg daily continued Gleevec dose reduced to 200 mg daily beginning 01/07/2021 (diarrhea) Gleevec placed on hold 01/27/2021 due to diarrhea CT abdomen/pelvis 03/04/2021-no irregular annular wall thickening in the remnant rectum contiguous with presacral space, compatible with recurrent tumor.  Small rectovaginal fistula identified.  Small amount of gas and rectal contrast in the vagina and vaginal introitus.  Interval progression of metastatic adenopathy in the left perirectal, lower left pelvic sidewall and high presacral chains. Sunitinib 37.5 mg daily beginning 04/04/2021 Sunitinib dose reduced to 25 mg daily 05/06/2021 due to diarrhea, mild neutropenia (she began the reduced dose on 05/15/2021) Sutent placed on hold 05/19/2021 due to diarrhea, weight loss, failure to thrive Sunitinib resumed 25 mg daily 05/26/2021 Sunitinib discontinued 05/30/2021 secondary to persistent diarrhea CTs 06/15/2021-similar appearance of annular wall thickening with luminal narrowing in the remnant rectum.  Previously noted small rectovaginal fistula again identified with gas noted within the lumen of the vagina.  Mild increase in the size of left perirectal left pelvic sidewall soft tissue nodules.  Large lobulated mass within the presacral soft tissue region not significantly changed in the interval. Regorafenib 06/22/2021-discontinued 07/01/2021 secondary to diarrhea Regorafenib resumed at a dose of 40 mg daily 07/08/2021 Regorafenib cycle completed 08/30/2021 Regorafenib cycle started 09/08/2021 Regorafenib placed on hold 09/29/2021 CTs 10/13/2021-increase size of left perianal, left perirectal and presacral soft tissue masses Ripretinib 10/22/2021 CT abdomen/pelvis 12/24/2021-interval increase in size of the index lesions within the presacral, left perianal and left perirectal regions.  Stable enlarged left posterior pelvic sidewall lymph node. Repretinib continued Remote history of pulmonary  embolism-maintained on apixaban Multiple colon polyps noted on the colonoscopy 07/21/2014 with the pathology revealing tubular adenomas Hospitalization 06/21/2015 through 07/06/2015 with nausea/vomiting, high ileostomy output; found to have delayed gastric emptying. Water soluble contrast enema 08/13/2015 positive for a small to moderate volume of water soluble contrast leakage from the low anterior resection and anastomosis site in the pelvis Ileostomy takedown and resection of cystic abdominal wall mass ( benign pathology) on 01/28/2016 Anemia with ferritin in the low normal range 05/23/2016 and 06/20/2016. Ferrous sulfate increased to twice daily 06/26/2016. Improved.    Disposition: Brianna Walker has metastatic gastrointestinal stromal tumor.  She is maintained on ripretinib.  She feels she continues to receive clinical benefit from the ripretinib.  Plan for restaging CTs in the next few weeks.    She is markedly tachycardic, likely dehydrated.  She was given a liter of IV fluids with improvement.  She will try to increase fluid intake at home.  She will return for a follow-up appointment the week of 08/11/2021.  We are available to see her sooner if needed.    Ned Card ANP/GNP-BC   02/23/2022  11:53 AM

## 2022-02-27 ENCOUNTER — Telehealth: Payer: Self-pay

## 2022-02-27 NOTE — Telephone Encounter (Signed)
Brianna Walker called and states her pain medication is not working. She currently taking 3 tablets of Oxy IR every 4 hours and Ms Contin every 12 hours. I advice the patient to increase to 4 tablets oxy IR every 4 hours Per Elby Showers. Marcello Moores

## 2022-03-02 ENCOUNTER — Telehealth: Payer: Self-pay | Admitting: *Deleted

## 2022-03-02 ENCOUNTER — Other Ambulatory Visit: Payer: Self-pay | Admitting: Nurse Practitioner

## 2022-03-02 ENCOUNTER — Other Ambulatory Visit (HOSPITAL_BASED_OUTPATIENT_CLINIC_OR_DEPARTMENT_OTHER): Payer: Self-pay

## 2022-03-02 DIAGNOSIS — C49A4 Gastrointestinal stromal tumor of large intestine: Secondary | ICD-10-CM

## 2022-03-02 MED ORDER — OXYCODONE HCL 10 MG PO TABS
10.0000 mg | ORAL_TABLET | ORAL | 0 refills | Status: DC | PRN
  Filled 2022-03-02: qty 60, 5d supply, fill #0

## 2022-03-02 MED ORDER — OXYCODONE HCL 5 MG PO TABS
10.0000 mg | ORAL_TABLET | ORAL | 0 refills | Status: DC | PRN
  Filled 2022-03-02: qty 80, 4d supply, fill #0

## 2022-03-02 NOTE — Telephone Encounter (Signed)
Left VM requesting refill on her oxycodone 5 mg to [facility]. Provider notified.

## 2022-03-09 ENCOUNTER — Other Ambulatory Visit (HOSPITAL_COMMUNITY): Payer: Self-pay

## 2022-03-09 ENCOUNTER — Other Ambulatory Visit: Payer: Self-pay | Admitting: *Deleted

## 2022-03-09 ENCOUNTER — Other Ambulatory Visit (HOSPITAL_BASED_OUTPATIENT_CLINIC_OR_DEPARTMENT_OTHER): Payer: Self-pay

## 2022-03-09 DIAGNOSIS — C49A4 Gastrointestinal stromal tumor of large intestine: Secondary | ICD-10-CM

## 2022-03-09 MED ORDER — RIPRETINIB 50 MG PO TABS
150.0000 mg | ORAL_TABLET | Freq: Every day | ORAL | 0 refills | Status: DC
  Filled 2022-03-09 (×2): qty 90, 30d supply, fill #0

## 2022-03-09 NOTE — Telephone Encounter (Signed)
Received faxed refill request from [facility] for Qinlock 50 mg.

## 2022-03-10 ENCOUNTER — Encounter: Payer: Self-pay | Admitting: Pharmacist

## 2022-03-10 MED ORDER — RIPRETINIB 50 MG PO TABS: 150.0000 mg | ORAL_TABLET | Freq: Every day | ORAL | 0 refills | Status: DC

## 2022-03-10 NOTE — Addendum Note (Signed)
Addended by: Darl Pikes on: 03/10/2022 12:50 PM   Modules accepted: Orders

## 2022-03-10 NOTE — Telephone Encounter (Signed)
Error

## 2022-03-10 NOTE — Telephone Encounter (Signed)
Prescription refill sent to [facility]. Patient fill Qinlock at [facility] prescription redirected to [facility].

## 2022-03-12 NOTE — Progress Notes (Unsigned)
Subjective:   Brianna Walker is a 76 y.o. female who presents for an Initial Medicare Annual Wellness Visit. I connected with  Brianna Walker on 03/12/22 by a audio enabled telemedicine application and verified that I am speaking with the correct person using two identifiers.  Patient Location: Home  Provider Location: Home Office  I discussed the limitations of evaluation and management by telemedicine. The patient expressed understanding and agreed to proceed.  Review of Systems    Deferred to PCP       Objective:    There were no vitals filed for this visit. There is no height or weight on file to calculate BMI.     01/26/2022    9:39 AM 12/29/2021   12:23 PM 12/08/2021   10:35 AM 11/24/2021   10:53 AM 10/18/2021    2:45 PM 10/06/2021   12:14 PM 09/08/2021   10:30 AM  Advanced Directives  Does Patient Have a Medical Advance Directive? No No No No No No No  Does patient want to make changes to medical advance directive? No - Patient declined No - Patient declined No - Patient declined No - Patient declined No - Patient declined No - Patient declined No - Patient declined  Would patient like information on creating a medical advance directive? No - Patient declined  No - Patient declined No - Patient declined No - Patient declined No - Patient declined No - Patient declined    Current Medications (verified) Outpatient Encounter Medications as of 03/13/2022  Medication Sig   apixaban (ELIQUIS) 5 MG TABS tablet TAKE 1 TABLET TWICE DAILY   atorvastatin (LIPITOR) 40 MG tablet TAKE 1 TABLET EVERY DAY   benazepril (LOTENSIN) 40 MG tablet TAKE 1 TABLET EVERY EVENING   COVID-19 mRNA bivalent vaccine, Pfizer, (PFIZER COVID-19 VAC BIVALENT) injection Inject into the muscle. (Patient not taking: Reported on 02/23/2022)   diphenoxylate-atropine (LOMOTIL) 2.5-0.025 MG tablet Take 1-2 tablets by mouth 4 times daily as needed for diarrhea or loose stool (max of 8 tablets daily)    diphenoxylate-atropine (LOMOTIL) 2.5-0.025 MG tablet Take 1-2 tablets by mouth four times daily as needed for diarrhea/loose stools. Max of 8 tablets per day.   estradiol (DOTTI) 0.025 MG/24HR APPLY  1  PATCH TOPICALLY TWICE WEEKLY   ferrous sulfate 325 (65 FE) MG tablet Take 1 tablet (325 mg total) by mouth 2 (two) times daily with a meal.   influenza vaccine adjuvanted (FLUAD) 0.5 ML injection Inject into the muscle. (Patient not taking: Reported on 02/23/2022)   loperamide (IMODIUM) 2 MG capsule Take 1-2 capsules (2-4 mg total) by mouth every 8 (eight) hours as needed for diarrhea or loose stools (Use if >2 BM every 8 hours).   magnesium oxide (MAG-OX) 400 (240 Mg) MG tablet TAKE 1 TABLET EVERY DAY   morphine (MS CONTIN) 30 MG 12 hr tablet Take 1 tablet (30 mg total) by mouth every 12 (twelve) hours.   nystatin (MYCOSTATIN/NYSTOP) powder APPLY TOPICALLY THREE TIMES DAILY AS NEEDED (Patient not taking: Reported on 12/29/2021)   omeprazole (PRILOSEC) 40 MG capsule TAKE 1 CAPSULE EVERY DAY   ondansetron (ZOFRAN) 8 MG tablet TAKE 1 TABLET EVERY 8 (EIGHT) HOURS AS NEEDED FOR NAUSEA OR VOMITING. (Patient not taking: Reported on 11/07/2021)   Oxycodone HCl 10 MG TABS Take 1-2 tablets (10-20 mg total) by mouth every 4 (four) hours as needed.   potassium chloride (MICRO-K) 10 MEQ CR capsule TAKE 2 CAPSULES EVERY DAY (SUBSTITUTED FOR  MICRO-K)  predniSONE (DELTASONE) 10 MG tablet Take 1 tablet (10 mg total) by mouth daily with breakfast.   ripretinib (QINLOCK) 50 MG tablet Take 3 tablets (150 mg total) by mouth daily.   solifenacin (VESICARE) 5 MG tablet TAKE 1 TABLET EVERY DAY   Vitamin D, Ergocalciferol, (DRISDOL) 50000 units CAPS capsule Take 1 capsule (50,000 Units total) by mouth every 7 (seven) days.   Facility-Administered Encounter Medications as of 03/13/2022  Medication   0.9 %  sodium chloride infusion    Allergies (verified) Patient has no known allergies.   History: Past Medical  History:  Diagnosis Date   AKI (acute kidney injury) (HCC) 06/21/2015   ALLERGIC RHINITIS 09/12/2007   Arthritis    BACK PAIN 06/19/2008   Cramp of limb 08/11/2009   DEEP VENOUS THROMBOPHLEBITIS, LEG, RIGHT 03/04/2010   right leg   Diverticulosis    FREQUENCY, URINARY 10/07/2009   GERD 12/05/2006   gist    dx. 4'16 -oral chemotherapy only.   HEMORRHOIDS 11/17/2006   HIP PAIN, RIGHT, CHRONIC 08/11/2009   HX, PERSONAL, TUBERCULOSIS 11/17/2006   HYPERLIPIDEMIA 09/12/2007   Hyperlipidemia 09/12/2007   Qualifier: Diagnosis of  By: Jonny Ruiz MD, Len Blalock    HYPERTENSION 11/17/2006   INSOMNIA-SLEEP DISORDER-UNSPEC 06/19/2008   Iron deficiency anemia 12/07/2011   LEG PAIN, RIGHT 06/19/2008   Long term (current) use of anticoagulants 11/29/2010   MASS, SUPERFICIAL 09/12/2007   MENOPAUSAL DISORDER 09/20/2009   OVERACTIVE BLADDER 03/04/2010   Perforation of colon (HCC) 11/17/2015   Pneumonia    PULMONARY EMBOLISM, HX OF 11/17/2006   [facility] s/p Pneumonia   RECTAL BLEEDING 10/07/2007   Rectal mass 06/2014   SCIATICA, RIGHT 09/12/2007   Shortness of breath dyspnea    With exertion since chemotherapy tx-oral meds   SKIN LESION 07/20/2009   TMJ SYNDROME 11/17/2006   Tuberculosis    pt. was tx.   Tubular adenoma of colon 2016   Past Surgical History:  Procedure Laterality Date   ABDOMINAL HYSTERECTOMY     CHOLECYSTECTOMY OPEN     COLONOSCOPY     EUS N/A 08/06/2014   Procedure: LOWER ENDOSCOPIC ULTRASOUND (EUS);  Surgeon: Rachael Fee, MD;  Location: [facility];  Service: Endoscopy;  Laterality: N/A;   EXCISION MASS ABDOMINAL N/A 01/28/2016   Procedure: EXCISION  ABDOMINAL WALL MASS, CYST, QUESTION FAT NECROSIS, QUESTION SEROMA;  Surgeon: Karie Soda, MD;  Location: [facility];  Service: General;  Laterality: N/A;   hemorroid surgery  removed at dr Anselm Jungling office   x 2   ILEOSTOMY CLOSURE N/A 01/28/2016   Procedure: TAKEDOWN OF LOOP ILEOSTOMY;  Surgeon: Karie Soda, MD;  Location: [facility];   Service: General;  Laterality: N/A;   INCISIONAL HERNIA REPAIR  2002     multiple ventral hernia repair/mesh   INCISIONAL HERNIA REPAIR  01/13/2005   open w onlay Proceed mesh.   LAPAROSCOPIC LYSIS OF ADHESIONS  06/04/2015   Procedure: LAPAROSCOPIC LYSIS OF ADHESIONS;  Surgeon: Karie Soda, MD;  Location: [facility];  Service: General;;   PROCTOSCOPY N/A 01/28/2016   Procedure: RIGID PROCTOSCOPY, DILATION OF COLORECTAL ANASTOMOTIC STRICTURE;  Surgeon: Karie Soda, MD;  Location: [facility];  Service: General;  Laterality: N/A;   s/p right hip replaced min invasive hip surgury [facility] oct 2011 Right 2011   tumor removed off of right shoulder Right    XI ROBOTIC ASSISTED LOWER ANTERIOR RESECTION N/A 06/04/2015   Procedure: XI ROBOTIC ASSISTED LYSIS OF ADHESIONS, LOWER ANTERIOR RESECTION TRANS  ABDOMINAL AND TRANS ANAL, COLOANAL HANDSEWN ANASTAOSIS, DIVERTING LOPE ILIOSTOMY, RESECTION GIST TUMOR;  Surgeon: Karie Soda, MD;  Location: [facility];  Service: General;  Laterality: N/A;   Family History  Problem Relation Age of Onset   Colon cancer Father    Heart disease Mother    Heart disease Sister    Diabetes Sister    Esophageal cancer Neg Hx    Stomach cancer Neg Hx    Rectal cancer Neg Hx    Social History   Socioeconomic History   Marital status: Married    Spouse name: Not on file   Number of children: 2   Years of education: Not on file   Highest education level: Not on file  Occupational History   Occupation: retired  Tobacco Use   Smoking status: Former    Packs/day: 0.00    Types: Cigarettes    Quit date: 06/09/1977    Years since quitting: 44.7   Smokeless tobacco: Never  Vaping Use   Vaping Use: Never used  Substance and Sexual Activity   Alcohol use: Yes    Alcohol/week: 0.0 standard drinks of alcohol    Comment: rare   Drug use: No    Comment: past marijuana   Sexual activity: Not on file  Other Topics Concern   Not on file  Social History Narrative   Husband civil  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[place]   Has #2 children (one son at home with her)   Prior Museum/gallery exhibitions officer service positions   Social Determinants of Health   Financial Resource Strain: Not on file  Food Insecurity: Not on file  Transportation Needs: No Transportation Needs (12/11/2018)   PRAPARE - Administrator, Civil Service (Medical): No    Lack of Transportation (Non-Medical): No  Physical Activity: Not on file  Stress: Not on file  Social Connections: Not on file    Tobacco Counseling Counseling given: Not Answered   Clinical Intake:                 Diabetic?No         Activities of Daily Living     No data to display           Patient Care Team: Corwin Levins, MD as PCP - Erline Hau, MD as Consulting Physician (General Surgery) Ladene Artist, MD as Consulting Physician (Oncology) Rachael Fee, MD as Consulting Physician (Gastroenterology) Artis Flock Quita Skye, MD as Consulting Physician (Family Medicine) Wandalee Ferdinand, RN as Registered Nurse  Indicate any recent Medical Services you may have received from other than [facility] providers in the past year (date may be approximate).     Assessment:   This is a routine wellness examination for [facility].  Hearing/Vision screen No results found.  Dietary issues and exercise activities discussed:     Goals Addressed   None   Depression Screen    06/14/2020    3:53 PM 06/18/2019    4:26 PM 05/07/2018    3:43 PM 04/16/2017    2:00 PM 02/09/2016    4:41 PM 09/14/2015    2:13 PM 02/11/2014    4:19 PM  PHQ 2/9 Scores  PHQ - 2 Score 0 0 0 0 0 1 0  PHQ- 9 Score    0       Fall Risk    06/14/2020    3:53 PM 06/18/2019    4:26 PM 05/07/2018    3:43 PM 04/16/2017    2:40  PM 10/05/2016    3:39 PM  Fall Risk   Falls in the past year? 0 0 0 No No    FALL RISK PREVENTION PERTAINING TO THE HOME:  Any stairs in or around the home? {YES/NO:21197} If so, are there any without handrails?  {YES/NO:21197} Home free of loose throw rugs in walkways, pet beds, electrical cords, etc? {YES/NO:21197} Adequate lighting in your home to reduce risk of falls? {YES/NO:21197}  ASSISTIVE DEVICES UTILIZED TO PREVENT FALLS:  Life alert? {YES/NO:21197} Use of a cane, walker or w/c? {YES/NO:21197} Grab bars in the bathroom? {YES/NO:21197} Shower chair or bench in shower? {YES/NO:21197} Elevated toilet seat or a handicapped toilet? {YES/NO:21197}  Cognitive Function:        Immunizations Immunization History  Administered Date(s) Administered   Fluad Quad(high Dose 65+) 01/30/2019, 03/23/2020, 01/26/2022   Influenza Split 04/05/2011   Influenza Whole 03/16/2010   Influenza, High Dose Seasonal PF 03/08/2017   Influenza,inj,Quad PF,6+ Mos 12/27/2012, 02/11/2014, 05/05/2015, 02/15/2016, 01/11/2018   PFIZER(Purple Top)SARS-COV-2 Vaccination 06/05/2019, 06/26/2019, 01/09/2020   Pfizer Covid-19 Vaccine Bivalent Booster 71yrs & up 03/10/2021   Pneumococcal Conjugate-13 03/05/2014   Pneumococcal Polysaccharide-23 05/01/2005, 01/22/2013   Td 09/15/2008    TDAP status: Due, Education has been provided regarding the importance of this vaccine. Advised may receive this vaccine at local pharmacy or Health Dept. Aware to provide a copy of the vaccination record if obtained from local pharmacy or Health Dept. Verbalized acceptance and understanding.  Flu Vaccine status: Up to date  Pneumococcal vaccine status: Up to date  Covid-19 vaccine status: Information provided on how to obtain vaccines.   Qualifies for Shingles Vaccine? Yes   Zostavax completed No   Shingrix Completed?: No.    Education has been provided regarding the importance of this vaccine. Patient has been advised to call insurance company to determine out of pocket expense if they have not yet received this vaccine. Advised may also receive vaccine at local pharmacy or Health Dept. Verbalized acceptance and  understanding.  Screening Tests Health Maintenance  Topic Date Due   Zoster Vaccines- Shingrix (1 of 2) Never done   TETANUS/TDAP  09/16/2018   COVID-19 Vaccine (5 - Pfizer risk series) 05/05/2021   MAMMOGRAM  01/02/2023   COLONOSCOPY (Pts 45-32yrs Insurance coverage will need to be confirmed)  01/21/2023   Medicare Annual Wellness (AWV)  03/14/2023   Pneumonia Vaccine 2+ Years old  Completed   INFLUENZA VACCINE  Completed   DEXA SCAN  Completed   Hepatitis C Screening  Completed   HPV VACCINES  Aged Out    Health Maintenance  Health Maintenance Due  Topic Date Due   Zoster Vaccines- Shingrix (1 of 2) Never done   TETANUS/TDAP  09/16/2018   COVID-19 Vaccine (5 - Pfizer risk series) 05/05/2021    Colorectal cancer screening: Type of screening: Colonoscopy. Completed 01/21/20. Repeat every 3 years  Mammogram status: Completed 01/01/21. Repeat every year  Bone Density status: Completed 11/28/16. Results reflect: Bone density results: NORMAL. Repeat every once years.  Lung Cancer Screening: (Low Dose CT Chest recommended if Age 45-80 years, 30 pack-year currently smoking OR have quit w/in 15years.) does not qualify.   Additional Screening:  Hepatitis C Screening: does qualify; Completed 10/05/16  Vision Screening: Recommended annual ophthalmology exams for early detection of glaucoma and other disorders of the eye. Is the patient up to date with their annual eye exam?  No  Who is the provider or what is the name of the office in  which the patient attends annual eye exams? Dr. Nile Riggs If pt is not established with a provider, would they like to be referred to a provider to establish care?  N/A .   Dental Screening: Recommended annual dental exams for proper oral hygiene  Community Resource Referral / Chronic Care Management: CRR required this visit?  {YES/NO:21197}  CCM required this visit?  {YES/NO:21197}     Plan:     I have personally reviewed and noted the following  in the patient's chart:   Medical and social history Use of alcohol, tobacco or illicit drugs  Current medications and supplements including opioid prescriptions. Patient is currently taking opioid prescriptions. Information provided to patient regarding non-opioid alternatives. Patient advised to discuss non-opioid treatment plan with their provider. Functional ability and status Nutritional status Physical activity Advanced directives List of other physicians Hospitalizations, surgeries, and ER visits in previous 12 months Vitals Screenings to include cognitive, depression, and falls Referrals and appointments  In addition, I have reviewed and discussed with patient certain preventive protocols, quality metrics, and best practice recommendations. A written personalized care plan for preventive services as well as general preventive health recommendations were provided to patient.     [name] Plump, RN   03/12/2022   Nurse Notes:  Ms. Latvala , Thank you for taking time to come for your Medicare Wellness Visit. I appreciate your ongoing commitment to your health goals. Please review the following plan we discussed and let me know if I can assist you in the future.   These are the goals we discussed:  Goals   None     This is a list of the screening recommended for you and due dates:  Health Maintenance  Topic Date Due   Zoster (Shingles) Vaccine (1 of 2) Never done   Tetanus Vaccine  09/16/2018   COVID-19 Vaccine (5 - Pfizer risk series) 05/05/2021   Mammogram  01/02/2023   Colon Cancer Screening  01/21/2023   Medicare Annual Wellness Visit  03/14/2023   Pneumonia Vaccine  Completed   Flu Shot  Completed   DEXA scan (bone density measurement)  Completed   Hepatitis C Screening: USPSTF Recommendation to screen - Ages 61-79 yo.  Completed   HPV Vaccine  Aged Out

## 2022-03-12 NOTE — Patient Instructions (Signed)

## 2022-03-13 ENCOUNTER — Inpatient Hospital Stay: Payer: Medicare HMO | Attending: Oncology

## 2022-03-13 ENCOUNTER — Other Ambulatory Visit: Payer: Self-pay | Admitting: Nurse Practitioner

## 2022-03-13 ENCOUNTER — Other Ambulatory Visit (HOSPITAL_BASED_OUTPATIENT_CLINIC_OR_DEPARTMENT_OTHER): Payer: Self-pay

## 2022-03-13 ENCOUNTER — Ambulatory Visit (HOSPITAL_COMMUNITY): Admission: RE | Admit: 2022-03-13 | Payer: Medicare HMO | Source: Ambulatory Visit

## 2022-03-13 ENCOUNTER — Ambulatory Visit: Payer: Medicare HMO | Admitting: *Deleted

## 2022-03-13 ENCOUNTER — Other Ambulatory Visit (HOSPITAL_COMMUNITY): Payer: Self-pay

## 2022-03-13 ENCOUNTER — Other Ambulatory Visit: Payer: Medicare HMO

## 2022-03-13 ENCOUNTER — Telehealth: Payer: Self-pay

## 2022-03-13 DIAGNOSIS — C49A4 Gastrointestinal stromal tumor of large intestine: Secondary | ICD-10-CM

## 2022-03-13 DIAGNOSIS — Z0000 Encounter for general adult medical examination without abnormal findings: Secondary | ICD-10-CM

## 2022-03-13 MED ORDER — OXYCODONE HCL 10 MG PO TABS
10.0000 mg | ORAL_TABLET | ORAL | 0 refills | Status: DC | PRN
  Filled 2022-03-13: qty 60, 5d supply, fill #0

## 2022-03-13 NOTE — Telephone Encounter (Signed)
Patient called and requested a refill for her Oxycodone '10mg'$ . I placed the requested on the NP's desk

## 2022-03-14 ENCOUNTER — Other Ambulatory Visit (HOSPITAL_BASED_OUTPATIENT_CLINIC_OR_DEPARTMENT_OTHER): Payer: Self-pay

## 2022-03-14 ENCOUNTER — Inpatient Hospital Stay: Payer: Medicare HMO

## 2022-03-14 ENCOUNTER — Inpatient Hospital Stay: Payer: Medicare HMO | Admitting: Nurse Practitioner

## 2022-03-16 ENCOUNTER — Other Ambulatory Visit (HOSPITAL_COMMUNITY): Payer: Self-pay

## 2022-03-17 ENCOUNTER — Other Ambulatory Visit (HOSPITAL_BASED_OUTPATIENT_CLINIC_OR_DEPARTMENT_OTHER): Payer: Self-pay

## 2022-03-17 ENCOUNTER — Other Ambulatory Visit: Payer: Self-pay | Admitting: Nurse Practitioner

## 2022-03-17 DIAGNOSIS — C49A4 Gastrointestinal stromal tumor of large intestine: Secondary | ICD-10-CM

## 2022-03-17 MED ORDER — MORPHINE SULFATE ER 30 MG PO TBCR
30.0000 mg | EXTENDED_RELEASE_TABLET | Freq: Two times a day (BID) | ORAL | 0 refills | Status: DC
  Filled 2022-03-17: qty 60, 30d supply, fill #0

## 2022-03-20 ENCOUNTER — Other Ambulatory Visit: Payer: Self-pay | Admitting: Nurse Practitioner

## 2022-03-20 ENCOUNTER — Other Ambulatory Visit (HOSPITAL_BASED_OUTPATIENT_CLINIC_OR_DEPARTMENT_OTHER): Payer: Self-pay

## 2022-03-20 DIAGNOSIS — C49A4 Gastrointestinal stromal tumor of large intestine: Secondary | ICD-10-CM

## 2022-03-20 MED ORDER — OXYCODONE HCL 10 MG PO TABS
10.0000 mg | ORAL_TABLET | ORAL | 0 refills | Status: DC | PRN
  Filled 2022-03-20: qty 60, 5d supply, fill #0

## 2022-03-21 ENCOUNTER — Other Ambulatory Visit (HOSPITAL_BASED_OUTPATIENT_CLINIC_OR_DEPARTMENT_OTHER): Payer: Self-pay

## 2022-03-22 ENCOUNTER — Inpatient Hospital Stay: Payer: Medicare HMO | Admitting: Nutrition

## 2022-03-22 NOTE — Progress Notes (Signed)
Telephone follow up completed with patient.  Last weight documented was 101 # 12.8 oz on Oct 26.  Labs noted: Glucose 115, Albumin 2.4 and Creatinine 1.03.  Patient reports she feels much better. States she is eating breakfast right now and drinking water and original boost. Denies Nausea, Vomiting, Constipation and Diarrhea. States she has everything under control now.  Nutrition Diagnosis: Unintentional weight loss continues.  Intervention: Encouraged her to change to Boost Plus and increase to TID. I don't have coupons now but will send her some if I receive in the future. Continue regular meals with high calorie and high protein foods.  Monitoring, Evaluation, Goals: Patient will increase calories and protein to promote weight gain.  Next Visit: To be scheduled as needed.

## 2022-03-27 ENCOUNTER — Other Ambulatory Visit: Payer: Self-pay | Admitting: Nurse Practitioner

## 2022-03-27 ENCOUNTER — Other Ambulatory Visit (HOSPITAL_BASED_OUTPATIENT_CLINIC_OR_DEPARTMENT_OTHER): Payer: Self-pay

## 2022-03-27 DIAGNOSIS — C49A4 Gastrointestinal stromal tumor of large intestine: Secondary | ICD-10-CM

## 2022-03-27 MED ORDER — OXYCODONE HCL 10 MG PO TABS
10.0000 mg | ORAL_TABLET | ORAL | 0 refills | Status: DC | PRN
  Filled 2022-03-27: qty 80, 7d supply, fill #0

## 2022-03-28 ENCOUNTER — Other Ambulatory Visit (HOSPITAL_BASED_OUTPATIENT_CLINIC_OR_DEPARTMENT_OTHER): Payer: Self-pay

## 2022-03-31 ENCOUNTER — Other Ambulatory Visit: Payer: Self-pay

## 2022-03-31 ENCOUNTER — Telehealth: Payer: Self-pay

## 2022-03-31 DIAGNOSIS — C49A4 Gastrointestinal stromal tumor of large intestine: Secondary | ICD-10-CM

## 2022-03-31 NOTE — Telephone Encounter (Signed)
Brianna Walker called to let us know she change her CT scan to 05/22/22. I explained to the patient she truly needs to keep her appointment for this month.

## 2022-04-02 ENCOUNTER — Other Ambulatory Visit: Payer: Self-pay | Admitting: Internal Medicine

## 2022-04-05 ENCOUNTER — Other Ambulatory Visit: Payer: Self-pay | Admitting: Nurse Practitioner

## 2022-04-05 ENCOUNTER — Other Ambulatory Visit (HOSPITAL_BASED_OUTPATIENT_CLINIC_OR_DEPARTMENT_OTHER): Payer: Self-pay

## 2022-04-05 DIAGNOSIS — C49A4 Gastrointestinal stromal tumor of large intestine: Secondary | ICD-10-CM

## 2022-04-06 ENCOUNTER — Other Ambulatory Visit (HOSPITAL_BASED_OUTPATIENT_CLINIC_OR_DEPARTMENT_OTHER): Payer: Self-pay

## 2022-04-06 ENCOUNTER — Other Ambulatory Visit: Payer: Self-pay | Admitting: Nurse Practitioner

## 2022-04-06 ENCOUNTER — Ambulatory Visit: Payer: Medicare HMO | Admitting: Nurse Practitioner

## 2022-04-06 ENCOUNTER — Telehealth: Payer: Self-pay | Admitting: *Deleted

## 2022-04-06 ENCOUNTER — Other Ambulatory Visit: Payer: Medicare HMO

## 2022-04-06 ENCOUNTER — Inpatient Hospital Stay: Payer: Medicare HMO

## 2022-04-06 ENCOUNTER — Ambulatory Visit (HOSPITAL_BASED_OUTPATIENT_CLINIC_OR_DEPARTMENT_OTHER): Payer: Medicare HMO

## 2022-04-06 DIAGNOSIS — C49A4 Gastrointestinal stromal tumor of large intestine: Secondary | ICD-10-CM

## 2022-04-06 MED ORDER — OXYCODONE HCL 10 MG PO TABS
10.0000 mg | ORAL_TABLET | ORAL | 0 refills | Status: DC | PRN
  Filled 2022-04-06: qty 80, 7d supply, fill #0

## 2022-04-06 NOTE — Telephone Encounter (Signed)
Brianna Walker called to report she needs to cancel her 12/12 appointment due to lack of transportation. Informed her that we will make arrangements for door to door transportation with [facility]. Scheduler reaching out to coordinator. She agrees.

## 2022-04-07 ENCOUNTER — Other Ambulatory Visit: Payer: Self-pay | Admitting: *Deleted

## 2022-04-07 DIAGNOSIS — C49A4 Gastrointestinal stromal tumor of large intestine: Secondary | ICD-10-CM

## 2022-04-07 MED ORDER — RIPRETINIB 50 MG PO TABS: 150.0000 mg | ORAL_TABLET | Freq: Every day | ORAL | 0 refills | Status: DC

## 2022-04-07 NOTE — Telephone Encounter (Signed)
Received refill request for her Qinlock from [facility].

## 2022-04-10 ENCOUNTER — Other Ambulatory Visit: Payer: Self-pay | Admitting: Nurse Practitioner

## 2022-04-10 ENCOUNTER — Other Ambulatory Visit (HOSPITAL_BASED_OUTPATIENT_CLINIC_OR_DEPARTMENT_OTHER): Payer: Self-pay

## 2022-04-10 DIAGNOSIS — C49A4 Gastrointestinal stromal tumor of large intestine: Secondary | ICD-10-CM

## 2022-04-11 ENCOUNTER — Encounter (HOSPITAL_BASED_OUTPATIENT_CLINIC_OR_DEPARTMENT_OTHER): Payer: Self-pay | Admitting: Pharmacist

## 2022-04-11 ENCOUNTER — Inpatient Hospital Stay: Payer: Medicare HMO

## 2022-04-11 ENCOUNTER — Other Ambulatory Visit: Payer: Self-pay | Admitting: Nurse Practitioner

## 2022-04-11 ENCOUNTER — Inpatient Hospital Stay: Payer: Medicare HMO | Admitting: Nurse Practitioner

## 2022-04-11 ENCOUNTER — Other Ambulatory Visit (HOSPITAL_BASED_OUTPATIENT_CLINIC_OR_DEPARTMENT_OTHER): Payer: Self-pay

## 2022-04-11 DIAGNOSIS — C49A4 Gastrointestinal stromal tumor of large intestine: Secondary | ICD-10-CM

## 2022-04-11 MED ORDER — OXYCODONE HCL 10 MG PO TABS
10.0000 mg | ORAL_TABLET | ORAL | 0 refills | Status: DC | PRN
  Filled 2022-04-11 – 2022-04-12 (×3): qty 20, 2d supply, fill #0

## 2022-04-12 ENCOUNTER — Other Ambulatory Visit: Payer: Self-pay

## 2022-04-12 ENCOUNTER — Inpatient Hospital Stay: Payer: Medicare HMO | Attending: Oncology

## 2022-04-12 ENCOUNTER — Encounter: Payer: Self-pay | Admitting: Nurse Practitioner

## 2022-04-12 ENCOUNTER — Other Ambulatory Visit: Payer: Self-pay | Admitting: Nurse Practitioner

## 2022-04-12 ENCOUNTER — Inpatient Hospital Stay (HOSPITAL_BASED_OUTPATIENT_CLINIC_OR_DEPARTMENT_OTHER): Payer: Medicare HMO | Admitting: Nurse Practitioner

## 2022-04-12 ENCOUNTER — Other Ambulatory Visit (HOSPITAL_COMMUNITY): Payer: Self-pay

## 2022-04-12 ENCOUNTER — Other Ambulatory Visit (HOSPITAL_BASED_OUTPATIENT_CLINIC_OR_DEPARTMENT_OTHER): Payer: Self-pay

## 2022-04-12 VITALS — BP 111/85 | HR 132 | Temp 98.2°F | Resp 18 | Ht 64.0 in | Wt 94.4 lb

## 2022-04-12 DIAGNOSIS — C49A4 Gastrointestinal stromal tumor of large intestine: Secondary | ICD-10-CM

## 2022-04-12 DIAGNOSIS — R000 Tachycardia, unspecified: Secondary | ICD-10-CM | POA: Diagnosis not present

## 2022-04-12 DIAGNOSIS — E860 Dehydration: Secondary | ICD-10-CM

## 2022-04-12 DIAGNOSIS — C49A5 Gastrointestinal stromal tumor of rectum: Secondary | ICD-10-CM | POA: Diagnosis not present

## 2022-04-12 LAB — CBC WITH DIFFERENTIAL (CANCER CENTER ONLY)
Abs Immature Granulocytes: 0.01 10*3/uL (ref 0.00–0.07)
Basophils Absolute: 0 10*3/uL (ref 0.0–0.1)
Basophils Relative: 0 %
Eosinophils Absolute: 0 10*3/uL (ref 0.0–0.5)
Eosinophils Relative: 1 %
HCT: 35.1 % — ABNORMAL LOW (ref 36.0–46.0)
Hemoglobin: 11 g/dL — ABNORMAL LOW (ref 12.0–15.0)
Immature Granulocytes: 0 %
Lymphocytes Relative: 24 %
Lymphs Abs: 2.1 10*3/uL (ref 0.7–4.0)
MCH: 24.7 pg — ABNORMAL LOW (ref 26.0–34.0)
MCHC: 31.3 g/dL (ref 30.0–36.0)
MCV: 78.9 fL — ABNORMAL LOW (ref 80.0–100.0)
Monocytes Absolute: 0.7 10*3/uL (ref 0.1–1.0)
Monocytes Relative: 8 %
Neutro Abs: 5.8 10*3/uL (ref 1.7–7.7)
Neutrophils Relative %: 67 %
Platelet Count: 354 10*3/uL (ref 150–400)
RBC: 4.45 MIL/uL (ref 3.87–5.11)
RDW: 18.1 % — ABNORMAL HIGH (ref 11.5–15.5)
WBC Count: 8.7 10*3/uL (ref 4.0–10.5)
nRBC: 0 % (ref 0.0–0.2)

## 2022-04-12 LAB — CMP (CANCER CENTER ONLY)
ALT: 16 U/L (ref 0–44)
AST: 16 U/L (ref 15–41)
Albumin: 2.8 g/dL — ABNORMAL LOW (ref 3.5–5.0)
Alkaline Phosphatase: 93 U/L (ref 38–126)
Anion gap: 10 (ref 5–15)
BUN: 16 mg/dL (ref 8–23)
CO2: 24 mmol/L (ref 22–32)
Calcium: 8.4 mg/dL — ABNORMAL LOW (ref 8.9–10.3)
Chloride: 106 mmol/L (ref 98–111)
Creatinine: 0.89 mg/dL (ref 0.44–1.00)
GFR, Estimated: >60 mL/min (ref >60)
Glucose, Bld: 88 mg/dL (ref 70–99)
Potassium: 3.9 mmol/L (ref 3.5–5.1)
Sodium: 140 mmol/L (ref 135–145)
Total Bilirubin: 0.9 mg/dL (ref 0.3–1.2)
Total Protein: 6.2 g/dL — ABNORMAL LOW (ref 6.5–8.1)

## 2022-04-12 MED ORDER — MORPHINE SULFATE ER 30 MG PO TBCR
30.0000 mg | EXTENDED_RELEASE_TABLET | Freq: Two times a day (BID) | ORAL | 0 refills | Status: DC
  Filled 2022-04-12 – 2022-04-19 (×2): qty 60, 30d supply, fill #0

## 2022-04-12 NOTE — Addendum Note (Signed)
Addended by: Owens Shark on: 04/12/2022 02:13 PM   Modules accepted: Orders

## 2022-04-12 NOTE — Progress Notes (Signed)
[place] OFFICE PROGRESS NOTE   Diagnosis: Gastrointestinal stromal tumor  INTERVAL HISTORY:   Brianna Walker returns for follow-up.  She was last seen 02/23/2022.  She was referred for restaging CTs.  She has canceled several follow-up appointments.  She has not completed the CT scans.  She continues ripretinib.  She denies nausea/vomiting.  No mouth sores.  No diarrhea.  No rash.  She has occasional rectal bleeding.  Appetite varies.  She has lost some weight since her last visit.  She has pain at the rectum.  She takes pain medication every 4 hours as well as a long-acting pain medication with good relief.  Objective:  Vital signs in last 24 hours:  Blood pressure 111/85, pulse (!) 132, temperature 98.2 F (36.8 C), temperature source Oral, resp. rate 18, height _0  (1.626 m), weight 94 lb 6.4 oz (42.8 kg), SpO2 99 %.    HEENT: No thrush or ulcers. Resp: Lungs clear bilaterally. Cardio: Regular, tachycardic. GI: No hepatosplenomegaly.  Right lower abdomen hernia. Vascular: No leg edema. Neuro: Alert and oriented. Skin: No rash.   Lab Results:  Lab Results  Component Value Date   WBC 8.7 04/12/2022   HGB 11.0 (L) 04/12/2022   HCT 35.1 (L) 04/12/2022   MCV 78.9 (L) 04/12/2022   PLT 354 04/12/2022   NEUTROABS 5.8 04/12/2022    Imaging:  No results found.  Medications: I have reviewed the patient's current medications.  Assessment/Plan: Gastrointestinal stromal tumor of the rectum Status post an endoscopic ultrasound on 08/06/2014 confirming a mass abutting the distal rectal wall with an FNA biopsy confirming a gastrointestinal stromal tumor Initiation of Gleevec 09/04/2014 MRI pelvis 12/15/2014 with interval decreased size of the large anorectal mass with central necrosis. Continuation of Gleevec Gleevec placed on hold 06/02/2015 in anticipation of surgery 06/04/2015 status post low anterior resection and diverting loop ileostomy, resection of  tumor Pathology: 9.5 cm tumor; GISTsubtype spindle; mitotic rate 1/50 high-power field; positive margin of resection; c-Kit mutation detected (Exon 13: [id number] ([id number])) Adjuvant Gleevec 08/19/2015 (three-year in total course planned) Ileostomy takedown 01/28/2016 Held 01/28/2016 through 02/14/2016, resumed 02/15/2016 Gleevec stop 04/11/2016-04/18/2016, and 04/26/2016-05/08/2016 Gleevec resumed at a dose of 200 mg daily on 05/09/2016 Gleevec dose increased to 300 mg daily beginning 06/20/2016 Gleevec dose decreased to 200 mg daily beginning 06/23/2016 (poor tolerance at 300 mg dose) CTs 11/22/2017- enhancing soft tissue mass in the presacral space measuring 5.4 x 3.5 cm.  Enlarged pelvic lymph nodes. Gleevec continued at a dose of 200 mg daily Restaging CTs 03/11/2018- interval decrease in size of the presacral soft tissue mass.  Stable small pelvic lymph nodes. Gleevec discontinued 03/13/2018 CT 11/14/2018- new 2.4 cm left pelvic nodule at the piriformis muscle, stable presacral lymph nodes, right abdominal wall hernia, ventral hernia Gleevec 200 mg daily 12/04/2018 CTs 03/31/2019 -decrease in size of soft tissue nodule deep left pelvis.  5 nodules in the sigmoid mesocolon stable to slightly larger.  Stable presacral soft tissue thickening.  No findings for new metastatic disease. CT abdomen/pelvis 09/01/2019-progressive presacral and left perirectal nodularity.  Additional mesenteric nodules along the sigmoid mesocolon and left pelvis unchanged. Gleevec dose escalated to 300 mg daily 09/02/2019 Gleevec dose escalated to 400 mg alternating with 300 mg beginning 09/16/2019 Gleevec 300 mg daily beginning 09/30/2019 (unable to tolerate 400 mg due to diarrhea) CT 12/15/2019-pelvic/peritoneal nodularity-unchanged CT 06/02/2020-mildly worsened soft tissue density between the lower rectum and the sacrum. Mildly worsened superior anorectal adenopathy and perirectal adenopathy.  Office  review of CT consistent with  stable disease Gleevec 300 mg daily continued Gleevec dose reduced to 200 mg daily beginning 01/07/2021 (diarrhea) Gleevec placed on hold 01/27/2021 due to diarrhea CT abdomen/pelvis 03/04/2021-no irregular annular wall thickening in the remnant rectum contiguous with presacral space, compatible with recurrent tumor.  Small rectovaginal fistula identified.  Small amount of gas and rectal contrast in the vagina and vaginal introitus.  Interval progression of metastatic adenopathy in the left perirectal, lower left pelvic sidewall and high presacral chains. Sunitinib 37.5 mg daily beginning 04/04/2021 Sunitinib dose reduced to 25 mg daily 05/06/2021 due to diarrhea, mild neutropenia (she began the reduced dose on 05/15/2021) Sutent placed on hold 05/19/2021 due to diarrhea, weight loss, failure to thrive Sunitinib resumed 25 mg daily 05/26/2021 Sunitinib discontinued 05/30/2021 secondary to persistent diarrhea CTs 06/15/2021-similar appearance of annular wall thickening with luminal narrowing in the remnant rectum.  Previously noted small rectovaginal fistula again identified with gas noted within the lumen of the vagina.  Mild increase in the size of left perirectal left pelvic sidewall soft tissue nodules.  Large lobulated mass within the presacral soft tissue region not significantly changed in the interval. Regorafenib 06/22/2021-discontinued 07/01/2021 secondary to diarrhea Regorafenib resumed at a dose of 40 mg daily 07/08/2021 Regorafenib cycle completed 08/30/2021 Regorafenib cycle started 09/08/2021 Regorafenib placed on hold 09/29/2021 CTs 10/13/2021-increase size of left perianal, left perirectal and presacral soft tissue masses Ripretinib 10/22/2021 CT abdomen/pelvis 12/24/2021-interval increase in size of the index lesions within the presacral, left perianal and left perirectal regions.  Stable enlarged left posterior pelvic sidewall lymph node. Repretinib continued Remote history of pulmonary  embolism-maintained on apixaban Multiple colon polyps noted on the colonoscopy 07/21/2014 with the pathology revealing tubular adenomas Hospitalization 06/21/2015 through 07/06/2015 with nausea/vomiting, high ileostomy output; found to have delayed gastric emptying. Water soluble contrast enema 08/13/2015 positive for a small to moderate volume of water soluble contrast leakage from the low anterior resection and anastomosis site in the pelvis Ileostomy takedown and resection of cystic abdominal wall mass ( benign pathology) on 01/28/2016 Anemia with ferritin in the low normal range 05/23/2016 and 06/20/2016. Ferrous sulfate increased to twice daily 06/26/2016. Improved.      Disposition: Brianna Walker appears unchanged.  She continues ripretinib.  We will get the CT scans rescheduled for the next few weeks.  She continues to have tachycardia.  This has improved in the past with IV fluids.  She declines IV fluids today.  CBC and chemistry panel reviewed.  Labs adequate to continue ripretinib.  She will return for follow-up in approximately 3 weeks, CT scans a few days prior.    Ned Card ANP/GNP-BC   04/12/2022  1:54 PM

## 2022-04-17 ENCOUNTER — Other Ambulatory Visit (HOSPITAL_BASED_OUTPATIENT_CLINIC_OR_DEPARTMENT_OTHER): Payer: Self-pay

## 2022-04-17 ENCOUNTER — Other Ambulatory Visit: Payer: Self-pay | Admitting: Nurse Practitioner

## 2022-04-17 DIAGNOSIS — C49A4 Gastrointestinal stromal tumor of large intestine: Secondary | ICD-10-CM

## 2022-04-17 MED ORDER — OXYCODONE HCL 10 MG PO TABS
10.0000 mg | ORAL_TABLET | ORAL | 0 refills | Status: DC | PRN
  Filled 2022-04-17: qty 80, 7d supply, fill #0

## 2022-04-18 ENCOUNTER — Other Ambulatory Visit (HOSPITAL_COMMUNITY): Payer: Self-pay

## 2022-04-18 ENCOUNTER — Other Ambulatory Visit (HOSPITAL_BASED_OUTPATIENT_CLINIC_OR_DEPARTMENT_OTHER): Payer: Self-pay

## 2022-04-19 ENCOUNTER — Other Ambulatory Visit (HOSPITAL_BASED_OUTPATIENT_CLINIC_OR_DEPARTMENT_OTHER): Payer: Self-pay

## 2022-04-19 ENCOUNTER — Encounter (HOSPITAL_BASED_OUTPATIENT_CLINIC_OR_DEPARTMENT_OTHER): Payer: Self-pay

## 2022-04-19 ENCOUNTER — Ambulatory Visit (HOSPITAL_BASED_OUTPATIENT_CLINIC_OR_DEPARTMENT_OTHER)
Admission: RE | Admit: 2022-04-19 | Discharge: 2022-04-19 | Disposition: A | Discharge status: Home / Self Care | Payer: Medicare HMO | Source: Ambulatory Visit | Attending: Nurse Practitioner | Admitting: Nurse Practitioner

## 2022-04-19 DIAGNOSIS — C49A0 Gastrointestinal stromal tumor, unspecified site: Secondary | ICD-10-CM | POA: Diagnosis not present

## 2022-04-19 DIAGNOSIS — C49A4 Gastrointestinal stromal tumor of large intestine: Secondary | ICD-10-CM | POA: Insufficient documentation

## 2022-04-19 DIAGNOSIS — K6389 Other specified diseases of intestine: Secondary | ICD-10-CM | POA: Diagnosis not present

## 2022-04-19 DIAGNOSIS — E860 Dehydration: Secondary | ICD-10-CM | POA: Diagnosis not present

## 2022-04-24 ENCOUNTER — Other Ambulatory Visit: Payer: Self-pay | Admitting: Internal Medicine

## 2022-04-25 ENCOUNTER — Other Ambulatory Visit (HOSPITAL_BASED_OUTPATIENT_CLINIC_OR_DEPARTMENT_OTHER): Payer: Self-pay

## 2022-04-25 ENCOUNTER — Other Ambulatory Visit: Payer: Self-pay | Admitting: Nurse Practitioner

## 2022-04-25 DIAGNOSIS — C49A4 Gastrointestinal stromal tumor of large intestine: Secondary | ICD-10-CM

## 2022-04-25 MED ORDER — OXYCODONE HCL 10 MG PO TABS
10.0000 mg | ORAL_TABLET | ORAL | 0 refills | Status: DC | PRN
  Filled 2022-04-25: qty 80, 7d supply, fill #0

## 2022-05-03 ENCOUNTER — Other Ambulatory Visit (HOSPITAL_BASED_OUTPATIENT_CLINIC_OR_DEPARTMENT_OTHER): Payer: Self-pay

## 2022-05-03 ENCOUNTER — Other Ambulatory Visit: Payer: Self-pay | Admitting: Nurse Practitioner

## 2022-05-03 DIAGNOSIS — C49A4 Gastrointestinal stromal tumor of large intestine: Secondary | ICD-10-CM

## 2022-05-03 MED ORDER — OXYCODONE HCL 10 MG PO TABS
10.0000 mg | ORAL_TABLET | ORAL | 0 refills | Status: DC | PRN
  Filled 2022-05-03: qty 80, 7d supply, fill #0

## 2022-05-03 MED ORDER — OXYCODONE HCL 10 MG PO TABS: 10.0000 mg | ORAL_TABLET | ORAL | 0 refills | Status: DC | PRN

## 2022-05-05 ENCOUNTER — Inpatient Hospital Stay: Payer: Medicare HMO | Admitting: Oncology

## 2022-05-05 ENCOUNTER — Inpatient Hospital Stay: Payer: Medicare HMO

## 2022-05-11 ENCOUNTER — Other Ambulatory Visit: Payer: Self-pay

## 2022-05-11 ENCOUNTER — Inpatient Hospital Stay: Payer: Medicare HMO | Attending: Oncology

## 2022-05-11 ENCOUNTER — Other Ambulatory Visit (HOSPITAL_BASED_OUTPATIENT_CLINIC_OR_DEPARTMENT_OTHER): Payer: Self-pay

## 2022-05-11 ENCOUNTER — Other Ambulatory Visit (HOSPITAL_COMMUNITY): Payer: Self-pay

## 2022-05-11 ENCOUNTER — Encounter: Payer: Self-pay | Admitting: Nurse Practitioner

## 2022-05-11 ENCOUNTER — Telehealth: Payer: Self-pay

## 2022-05-11 ENCOUNTER — Inpatient Hospital Stay (HOSPITAL_BASED_OUTPATIENT_CLINIC_OR_DEPARTMENT_OTHER): Payer: Medicare HMO | Admitting: Nurse Practitioner

## 2022-05-11 VITALS — BP 112/81 | HR 140 | Temp 98.1°F | Resp 18 | Ht 64.0 in | Wt 92.8 lb

## 2022-05-11 DIAGNOSIS — Z79891 Long term (current) use of opiate analgesic: Secondary | ICD-10-CM | POA: Insufficient documentation

## 2022-05-11 DIAGNOSIS — G893 Neoplasm related pain (acute) (chronic): Secondary | ICD-10-CM | POA: Diagnosis not present

## 2022-05-11 DIAGNOSIS — C49A5 Gastrointestinal stromal tumor of rectum: Secondary | ICD-10-CM | POA: Insufficient documentation

## 2022-05-11 DIAGNOSIS — C49A4 Gastrointestinal stromal tumor of large intestine: Secondary | ICD-10-CM | POA: Diagnosis not present

## 2022-05-11 LAB — CMP (CANCER CENTER ONLY)
ALT: 15 U/L (ref 0–44)
AST: 15 U/L (ref 15–41)
Albumin: 2.4 g/dL — ABNORMAL LOW (ref 3.5–5.0)
Alkaline Phosphatase: 122 U/L (ref 38–126)
Anion gap: 9 (ref 5–15)
BUN: 16 mg/dL (ref 8–23)
CO2: 23 mmol/L (ref 22–32)
Calcium: 8.2 mg/dL — ABNORMAL LOW (ref 8.9–10.3)
Chloride: 111 mmol/L (ref 98–111)
Creatinine: 0.92 mg/dL (ref 0.44–1.00)
GFR, Estimated: >60 mL/min (ref >60)
Glucose, Bld: 88 mg/dL (ref 70–99)
Potassium: 3.9 mmol/L (ref 3.5–5.1)
Sodium: 143 mmol/L (ref 135–145)
Total Bilirubin: 0.5 mg/dL (ref 0.3–1.2)
Total Protein: 6 g/dL — ABNORMAL LOW (ref 6.5–8.1)

## 2022-05-11 LAB — CBC WITH DIFFERENTIAL (CANCER CENTER ONLY)
Abs Immature Granulocytes: 0.02 10*3/uL (ref 0.00–0.07)
Basophils Absolute: 0 10*3/uL (ref 0.0–0.1)
Basophils Relative: 1 %
Eosinophils Absolute: 0 10*3/uL (ref 0.0–0.5)
Eosinophils Relative: 1 %
HCT: 31.9 % — ABNORMAL LOW (ref 36.0–46.0)
Hemoglobin: 9.8 g/dL — ABNORMAL LOW (ref 12.0–15.0)
Immature Granulocytes: 0 %
Lymphocytes Relative: 30 %
Lymphs Abs: 2 10*3/uL (ref 0.7–4.0)
MCH: 24 pg — ABNORMAL LOW (ref 26.0–34.0)
MCHC: 30.7 g/dL (ref 30.0–36.0)
MCV: 78 fL — ABNORMAL LOW (ref 80.0–100.0)
Monocytes Absolute: 0.5 10*3/uL (ref 0.1–1.0)
Monocytes Relative: 8 %
Neutro Abs: 4 10*3/uL (ref 1.7–7.7)
Neutrophils Relative %: 60 %
Platelet Count: 439 10*3/uL — ABNORMAL HIGH (ref 150–400)
RBC: 4.09 MIL/uL (ref 3.87–5.11)
RDW: 17.8 % — ABNORMAL HIGH (ref 11.5–15.5)
WBC Count: 6.6 10*3/uL (ref 4.0–10.5)
nRBC: 0 % (ref 0.0–0.2)

## 2022-05-11 MED ORDER — HYDROMORPHONE HCL 4 MG PO TABS
8.0000 mg | ORAL_TABLET | ORAL | 0 refills | Status: DC | PRN
  Filled 2022-05-11: qty 100, 7d supply, fill #0

## 2022-05-11 MED ORDER — MORPHINE SULFATE ER 60 MG PO TBCR
60.0000 mg | EXTENDED_RELEASE_TABLET | Freq: Two times a day (BID) | ORAL | 0 refills | Status: DC
  Filled 2022-05-11: qty 60, 30d supply, fill #0

## 2022-05-11 NOTE — Progress Notes (Signed)
[place] OFFICE PROGRESS NOTE   Diagnosis: Gastrointestinal stromal tumor  INTERVAL HISTORY:   Brianna Walker returns for follow-up.  She continues ripretinib.  She reports poor pain control.  She is currently taking 30 mg of oxycodone every 4 hours around-the-clock.  Current MS Contin dose is 30 mg every 12 hours.  She is having frequent bowel movements.  Objective:  Vital signs in last 24 hours:  Blood pressure 112/81, pulse (!) 140, temperature 98.1 F (36.7 C), temperature source Oral, resp. rate 18, height '5\' 4"'$  (1.626 m), weight 92 lb 12.8 oz (42.1 kg), SpO2 100 %.    Resp: Lungs clear bilaterally. Cardio: Regular, marked tachycardia. GI: Abdomen is soft.  No hepatosplenomegaly.  Right lower abdomen hernia. Vascular: No leg edema. Neuro: Alert and oriented.    Lab Results:  Lab Results  Component Value Date   WBC 6.6 05/11/2022   HGB 9.8 (L) 05/11/2022   HCT 31.9 (L) 05/11/2022   MCV 78.0 (L) 05/11/2022   PLT 439 (H) 05/11/2022   NEUTROABS 4.0 05/11/2022    Imaging:  No results found.  Medications: I have reviewed the patient's current medications.  Assessment/Plan: Gastrointestinal stromal tumor of the rectum Status post an endoscopic ultrasound on 08/06/2014 confirming a mass abutting the distal rectal wall with an FNA biopsy confirming a gastrointestinal stromal tumor Initiation of Gleevec 09/04/2014 MRI pelvis 12/15/2014 with interval decreased size of the large anorectal mass with central necrosis. Continuation of Gleevec Gleevec placed on hold 06/02/2015 in anticipation of surgery 06/04/2015 status post low anterior resection and diverting loop ileostomy, resection of tumor Pathology: 9.5 cm tumor; GISTsubtype spindle; mitotic rate 1/50 high-power field; positive margin of resection; c-Kit mutation detected (Exon 13: c.[id number] (p.[id number])) Adjuvant Gleevec 08/19/2015 (three-year in total course planned) Ileostomy takedown  01/28/2016 Held 01/28/2016 through 02/14/2016, resumed 02/15/2016 Gleevec stop 04/11/2016-04/18/2016, and 04/26/2016-05/08/2016 Gleevec resumed at a dose of 200 mg daily on 05/09/2016 Gleevec dose increased to 300 mg daily beginning 06/20/2016 Gleevec dose decreased to 200 mg daily beginning 06/23/2016 (poor tolerance at 300 mg dose) CTs 11/22/2017- enhancing soft tissue mass in the presacral space measuring 5.4 x 3.5 cm.  Enlarged pelvic lymph nodes. Gleevec continued at a dose of 200 mg daily Restaging CTs 03/11/2018- interval decrease in size of the presacral soft tissue mass.  Stable small pelvic lymph nodes. Gleevec discontinued 03/13/2018 CT 11/14/2018- new 2.4 cm left pelvic nodule at the piriformis muscle, stable presacral lymph nodes, right abdominal wall hernia, ventral hernia Gleevec 200 mg daily 12/04/2018 CTs 03/31/2019 -decrease in size of soft tissue nodule deep left pelvis.  5 nodules in the sigmoid mesocolon stable to slightly larger.  Stable presacral soft tissue thickening.  No findings for new metastatic disease. CT abdomen/pelvis 09/01/2019-progressive presacral and left perirectal nodularity.  Additional mesenteric nodules along the sigmoid mesocolon and left pelvis unchanged. Gleevec dose escalated to 300 mg daily 09/02/2019 Gleevec dose escalated to 400 mg alternating with 300 mg beginning 09/16/2019 Gleevec 300 mg daily beginning 09/30/2019 (unable to tolerate 400 mg due to diarrhea) CT 12/15/2019-pelvic/peritoneal nodularity-unchanged CT 06/02/2020-mildly worsened soft tissue density between the lower rectum and the sacrum. Mildly worsened superior anorectal adenopathy and perirectal adenopathy.  Office review of CT consistent with stable disease Gleevec 300 mg daily continued Gleevec dose reduced to 200 mg daily beginning 01/07/2021 (diarrhea) Gleevec placed on hold 01/27/2021 due to diarrhea CT abdomen/pelvis 03/04/2021-no irregular annular wall thickening in the remnant rectum  contiguous with presacral space, compatible with recurrent tumor.  Small rectovaginal fistula identified.  Small amount of gas and rectal contrast in the vagina and vaginal introitus.  Interval progression of metastatic adenopathy in the left perirectal, lower left pelvic sidewall and high presacral chains. Sunitinib 37.5 mg daily beginning 04/04/2021 Sunitinib dose reduced to 25 mg daily 05/06/2021 due to diarrhea, mild neutropenia (she began the reduced dose on 05/15/2021) Sutent placed on hold 05/19/2021 due to diarrhea, weight loss, failure to thrive Sunitinib resumed 25 mg daily 05/26/2021 Sunitinib discontinued 05/30/2021 secondary to persistent diarrhea CTs 06/15/2021-similar appearance of annular wall thickening with luminal narrowing in the remnant rectum.  Previously noted small rectovaginal fistula again identified with gas noted within the lumen of the vagina.  Mild increase in the size of left perirectal left pelvic sidewall soft tissue nodules.  Large lobulated mass within the presacral soft tissue region not significantly changed in the interval. Regorafenib 06/22/2021-discontinued 07/01/2021 secondary to diarrhea Regorafenib resumed at a dose of 40 mg daily 07/08/2021 Regorafenib cycle completed 08/30/2021 Regorafenib cycle started 09/08/2021 Regorafenib placed on hold 09/29/2021 CTs 10/13/2021-increase size of left perianal, left perirectal and presacral soft tissue masses Ripretinib 10/22/2021 CT abdomen/pelvis 12/24/2021-interval increase in size of the index lesions within the presacral, left perianal and left perirectal regions.  Stable enlarged left posterior pelvic sidewall lymph node. Repretinib continued CT abdomen/pelvis 04/19/2022-worsening of disease with enlargement of multifocal disease in the pelvis.  No signs of new metastatic disease. Ripretinib discontinued, hospice referral made Remote history of pulmonary embolism-maintained on apixaban Multiple colon polyps noted on the  colonoscopy 07/21/2014 with the pathology revealing tubular adenomas Hospitalization 06/21/2015 through 07/06/2015 with nausea/vomiting, high ileostomy output; found to have delayed gastric emptying. Water soluble contrast enema 08/13/2015 positive for a small to moderate volume of water soluble contrast leakage from the low anterior resection and anastomosis site in the pelvis Ileostomy takedown and resection of cystic abdominal wall mass ( benign pathology) on 01/28/2016 Anemia with ferritin in the low normal range 05/23/2016 and 06/20/2016. Ferrous sulfate increased to twice daily 06/26/2016. Improved.    Disposition: Brianna Walker has metastatic gastrointestinal stromal tumor.  Most recently she has been treated with ripretinib.  Recent restaging CTs show evidence of progression.  She and her sister understand the recommendation to discontinue ripretinib.  We discussed a hospice referral.  She is in agreement.  We discussed end-of-life issues/CODE STATUS.  She will be placed on NO CODE BLUE status.  Pain is poorly controlled.  We adjusted the regimen to include Dilaudid 8 to 12 mg every 4 hours as needed, MS Contin increased to 60 mg every 12 hours.  She will return for follow-up in 3 to 4 weeks.  We are available to see her sooner if needed.  Patient seen with Dr. Benay Spice.   Ned Card ANP/GNP-BC   05/11/2022  10:50 AM This was a shared visit with Ned Card.  Ms. Gainey was interviewed and examined.  We reviewed the CT findings and images with Ms. Espinoza.  There is clinical and radiologic evidence of disease progression.  She is symptomatic with pain related to pelvic masses.  She has been treated with multiple lines of systemic therapy.  I recommend hospice care.  She is in agreement.  We adjusted the narcotic pain regimen.  I was present for greater than 50% of today's visit.  I performed medical decision making.  Julieanne Manson, MD

## 2022-05-11 NOTE — Telephone Encounter (Signed)
I called [facility] and spoke with [facility] to  confirm the referral. She is aware Dr. Benay Spice will be the attending and no blue code. I gave the patient cell number [phone] to be call.

## 2022-05-22 ENCOUNTER — Inpatient Hospital Stay: Payer: Medicare HMO

## 2022-05-22 ENCOUNTER — Other Ambulatory Visit (HOSPITAL_BASED_OUTPATIENT_CLINIC_OR_DEPARTMENT_OTHER): Payer: Medicare HMO

## 2022-05-23 ENCOUNTER — Other Ambulatory Visit: Payer: Self-pay | Admitting: Nurse Practitioner

## 2022-05-23 ENCOUNTER — Other Ambulatory Visit (HOSPITAL_BASED_OUTPATIENT_CLINIC_OR_DEPARTMENT_OTHER): Payer: Self-pay

## 2022-05-23 DIAGNOSIS — C49A4 Gastrointestinal stromal tumor of large intestine: Secondary | ICD-10-CM

## 2022-05-23 MED ORDER — HYDROMORPHONE HCL 4 MG PO TABS
8.0000 mg | ORAL_TABLET | ORAL | 0 refills | Status: DC | PRN
  Filled 2022-05-23: qty 100, 6d supply, fill #0

## 2022-05-24 ENCOUNTER — Ambulatory Visit: Payer: Self-pay | Admitting: Licensed Clinical Social Worker

## 2022-05-24 NOTE — Telephone Encounter (Signed)
Too early for refill  

## 2022-05-24 NOTE — Patient Outreach (Signed)
  Care Coordination   05/24/2022 Name: Brianna Walker MRN: [id number] DOB: 1945-12-05   Care Coordination Outreach Attempts:  An unsuccessful telephone outreach was attempted today to offer the patient information about available care coordination services as a benefit of their health plan.   Follow Up Plan:  Additional outreach attempts will be made to offer the patient care coordination information and services.   Encounter Outcome:  No Answer   Care Coordination Interventions:  No, not indicated    Brianna Walker.Kraig Genis MSW, [employer] representative [facility] Care Management [phone]

## 2022-06-02 ENCOUNTER — Other Ambulatory Visit: Payer: Self-pay | Admitting: Nurse Practitioner

## 2022-06-02 ENCOUNTER — Other Ambulatory Visit (HOSPITAL_BASED_OUTPATIENT_CLINIC_OR_DEPARTMENT_OTHER): Payer: Self-pay

## 2022-06-02 DIAGNOSIS — C49A4 Gastrointestinal stromal tumor of large intestine: Secondary | ICD-10-CM

## 2022-06-05 ENCOUNTER — Other Ambulatory Visit: Payer: Self-pay | Admitting: Nurse Practitioner

## 2022-06-05 ENCOUNTER — Ambulatory Visit: Payer: Self-pay | Admitting: Licensed Clinical Social Worker

## 2022-06-05 NOTE — Patient Outreach (Signed)
  Care Coordination   06/05/2022 Name: Brianna Walker MRN: [id number] DOB: 1945-09-05   Care Coordination Outreach Attempts:  An unsuccessful telephone outreach was attempted today to offer the patient information about available care coordination services as a benefit of their health plan.   Follow Up Plan:  Additional outreach attempts will be made to offer the patient care coordination information and services.   Encounter Outcome:  No Answer   Care Coordination Interventions:  No, not indicated    Norva Riffle.Melvine Julin MSW, [place] [employer] representative [facility] Care Management [phone]

## 2022-06-05 NOTE — Telephone Encounter (Signed)
Can you decline the refill. It won't let me. Brianna Walker

## 2022-06-07 ENCOUNTER — Other Ambulatory Visit: Payer: Self-pay | Admitting: Internal Medicine

## 2022-06-07 ENCOUNTER — Other Ambulatory Visit (HOSPITAL_BASED_OUTPATIENT_CLINIC_OR_DEPARTMENT_OTHER): Payer: Self-pay

## 2022-06-08 ENCOUNTER — Inpatient Hospital Stay: Payer: Medicare HMO | Admitting: Oncology

## 2022-06-08 ENCOUNTER — Other Ambulatory Visit: Payer: Self-pay | Admitting: Oncology

## 2022-06-08 DIAGNOSIS — C49A4 Gastrointestinal stromal tumor of large intestine: Secondary | ICD-10-CM

## 2022-06-09 ENCOUNTER — Other Ambulatory Visit (HOSPITAL_BASED_OUTPATIENT_CLINIC_OR_DEPARTMENT_OTHER): Payer: Self-pay

## 2022-06-10 ENCOUNTER — Other Ambulatory Visit (HOSPITAL_BASED_OUTPATIENT_CLINIC_OR_DEPARTMENT_OTHER): Payer: Self-pay

## 2022-06-12 ENCOUNTER — Other Ambulatory Visit (HOSPITAL_BASED_OUTPATIENT_CLINIC_OR_DEPARTMENT_OTHER): Payer: Self-pay

## 2022-06-15 ENCOUNTER — Other Ambulatory Visit: Payer: Self-pay | Admitting: *Deleted

## 2022-06-15 ENCOUNTER — Inpatient Hospital Stay: Payer: Medicare HMO | Attending: Oncology | Admitting: Oncology

## 2022-06-15 DIAGNOSIS — C49A4 Gastrointestinal stromal tumor of large intestine: Secondary | ICD-10-CM

## 2022-06-15 MED ORDER — PREDNISONE 10 MG PO TABS: 10.0000 mg | ORAL_TABLET | Freq: Every day | ORAL | 3 refills | Status: DC

## 2022-06-15 NOTE — Progress Notes (Signed)
[place] OFFICE VISIT PROGRESS NOTE  I connected with Brianna Walker on 06/15/22 at 10:20 AM EST by telephone and verified that I am speaking with the correct person using two identifiers.   I discussed the limitations, risks, security and privacy concerns of performing an evaluation and management service by telemedicine and the availability of in-person appointments. I also discussed with the patient that there may be a patient responsible charge related to this service. The patient expressed understanding and agreed to proceed.  Patient's location: Home Provider's location: Office    Diagnosis: Gastrointestinal stromal tumor  INTERVAL HISTORY:   Brianna Walker is seen today for a telehealth visit per her request.  She has enrolled in home hospice care.  She reports adequate pain control with MS Contin and hydromorphone.  She takes 3 hydromorphone tablets approximately every 4 hours.  She reports a good appetite.  Prednisone has helped the appetite.  Her bowels are moving.  No new complaints.  The hospice nurse is visiting weekly.   Medications: I have reviewed the patient's current medications.  Assessment/Plan: Gastrointestinal stromal tumor of the rectum Status post an endoscopic ultrasound on 08/06/2014 confirming a mass abutting the distal rectal wall with an FNA biopsy confirming a gastrointestinal stromal tumor Initiation of Gleevec 09/04/2014 MRI pelvis 12/15/2014 with interval decreased size of the large anorectal mass with central necrosis. Continuation of Gleevec Gleevec placed on hold 06/02/2015 in anticipation of surgery 06/04/2015 status post low anterior resection and diverting loop ileostomy, resection of tumor Pathology: 9.5 cm tumor; GISTsubtype spindle; mitotic rate 1/50 high-power field; positive margin of resection; c-Kit mutation detected (Exon 13: c.1924A>G (p.K642E)) Adjuvant Gleevec 08/19/2015 (three-year in  total course planned) Ileostomy takedown 01/28/2016 Held 01/28/2016 through 02/14/2016, resumed 02/15/2016 Gleevec stop 04/11/2016-04/18/2016, and 04/26/2016-05/08/2016 Gleevec resumed at a dose of 200 mg daily on 05/09/2016 Gleevec dose increased to 300 mg daily beginning 06/20/2016 Gleevec dose decreased to 200 mg daily beginning 06/23/2016 (poor tolerance at 300 mg dose) CTs 11/22/2017- enhancing soft tissue mass in the presacral space measuring 5.4 x 3.5 cm.  Enlarged pelvic lymph nodes. Gleevec continued at a dose of 200 mg daily Restaging CTs 03/11/2018- interval decrease in size of the presacral soft tissue mass.  Stable small pelvic lymph nodes. Gleevec discontinued 03/13/2018 CT 11/14/2018- new 2.4 cm left pelvic nodule at the piriformis muscle, stable presacral lymph nodes, right abdominal wall hernia, ventral hernia Gleevec 200 mg daily 12/04/2018 CTs 03/31/2019 -decrease in size of soft tissue nodule deep left pelvis.  5 nodules in the sigmoid mesocolon stable to slightly larger.  Stable presacral soft tissue thickening.  No findings for new metastatic disease. CT abdomen/pelvis 09/01/2019-progressive presacral and left perirectal nodularity.  Additional mesenteric nodules along the sigmoid mesocolon and left pelvis unchanged. Gleevec dose escalated to 300 mg daily 09/02/2019 Gleevec dose escalated to 400 mg alternating with 300 mg beginning 09/16/2019 Gleevec 300 mg daily beginning 09/30/2019 (unable to tolerate 400 mg due to diarrhea) CT 12/15/2019-pelvic/peritoneal nodularity-unchanged CT 06/02/2020-mildly worsened soft tissue density between the lower rectum and the sacrum. Mildly worsened superior anorectal adenopathy and perirectal adenopathy.  Office review of CT consistent with stable disease Gleevec 300 mg daily continued Gleevec dose reduced to 200 mg daily beginning 01/07/2021 (diarrhea) Gleevec placed on hold 01/27/2021 due to diarrhea CT abdomen/pelvis 03/04/2021-no irregular annular  wall thickening in the remnant rectum contiguous with presacral space, compatible with recurrent tumor.  Small rectovaginal fistula identified.  Small amount of gas  and rectal contrast in the vagina and vaginal introitus.  Interval progression of metastatic adenopathy in the left perirectal, lower left pelvic sidewall and high presacral chains. Sunitinib 37.5 mg daily beginning 04/04/2021 Sunitinib dose reduced to 25 mg daily 05/06/2021 due to diarrhea, mild neutropenia (she began the reduced dose on 05/15/2021) Sutent placed on hold 05/19/2021 due to diarrhea, weight loss, failure to thrive Sunitinib resumed 25 mg daily 05/26/2021 Sunitinib discontinued 05/30/2021 secondary to persistent diarrhea CTs 06/15/2021-similar appearance of annular wall thickening with luminal narrowing in the remnant rectum.  Previously noted small rectovaginal fistula again identified with gas noted within the lumen of the vagina.  Mild increase in the size of left perirectal left pelvic sidewall soft tissue nodules.  Large lobulated mass within the presacral soft tissue region not significantly changed in the interval. Regorafenib 06/22/2021-discontinued 07/01/2021 secondary to diarrhea Regorafenib resumed at a dose of 40 mg daily 07/08/2021 Regorafenib cycle completed 08/30/2021 Regorafenib cycle started 09/08/2021 Regorafenib placed on hold 09/29/2021 CTs 10/13/2021-increase size of left perianal, left perirectal and presacral soft tissue masses Ripretinib 10/22/2021 CT abdomen/pelvis 12/24/2021-interval increase in size of the index lesions within the presacral, left perianal and left perirectal regions.  Stable enlarged left posterior pelvic sidewall lymph node. Repretinib continued CT abdomen/pelvis 04/19/2022-worsening of disease with enlargement of multifocal disease in the pelvis.  No signs of new metastatic disease. Ripretinib discontinued, hospice referral made Remote history of pulmonary embolism-maintained on  apixaban Multiple colon polyps noted on the colonoscopy 07/21/2014 with the pathology revealing tubular adenomas Hospitalization 06/21/2015 through 07/06/2015 with nausea/vomiting, high ileostomy output; found to have delayed gastric emptying. Water soluble contrast enema 08/13/2015 positive for a small to moderate volume of water soluble contrast leakage from the low anterior resection and anastomosis site in the pelvis Ileostomy takedown and resection of cystic abdominal wall mass ( benign pathology) on 01/28/2016 Anemia with ferritin in the low normal range 05/23/2016 and 06/20/2016. Ferrous sulfate increased to twice daily 06/26/2016. Improved.      Disposition: Brianna Walker has an advanced stage gastrointestinal stromal tumor.  She has enrolled in home hospice care.  She reports home hospice is working well.  Her pain is under adequate control with MS Contin and hydromorphone.  She will continue the current narcotic regimen.  We reviewed her medical regimen.  We will refill her prescription for prednisone.  Brianna Walker would like to continue telehealth visits at the cancer center.  We will schedule a telehealth visit in 4 weeks.   I discussed the assessment and treatment plan with the patient. The patient was provided an opportunity to ask questions and all were answered. The patient agreed with the plan and demonstrated an understanding of the instructions.   The patient was advised to call back or seek an in-person evaluation if the symptoms worsen or if the condition fails to improve as anticipated.  I provided 20 minutes of chart review, telephone, and documentation time during this encounter, and > 50% was spent counseling as documented under my assessment & plan.  Betsy Coder ANP/GNP-BC   06/15/2022 10:48 AM

## 2022-06-16 ENCOUNTER — Other Ambulatory Visit (HOSPITAL_BASED_OUTPATIENT_CLINIC_OR_DEPARTMENT_OTHER): Payer: Self-pay

## 2022-06-20 ENCOUNTER — Other Ambulatory Visit (HOSPITAL_BASED_OUTPATIENT_CLINIC_OR_DEPARTMENT_OTHER): Payer: Self-pay

## 2022-06-21 ENCOUNTER — Other Ambulatory Visit: Payer: Self-pay | Admitting: Internal Medicine

## 2022-06-23 ENCOUNTER — Other Ambulatory Visit (HOSPITAL_BASED_OUTPATIENT_CLINIC_OR_DEPARTMENT_OTHER): Payer: Self-pay

## 2022-06-26 ENCOUNTER — Other Ambulatory Visit (HOSPITAL_BASED_OUTPATIENT_CLINIC_OR_DEPARTMENT_OTHER): Payer: Self-pay

## 2022-06-29 ENCOUNTER — Other Ambulatory Visit (HOSPITAL_COMMUNITY): Payer: Self-pay

## 2022-06-30 ENCOUNTER — Other Ambulatory Visit: Payer: Self-pay | Admitting: Oncology

## 2022-06-30 ENCOUNTER — Other Ambulatory Visit (HOSPITAL_COMMUNITY): Payer: Self-pay

## 2022-06-30 ENCOUNTER — Other Ambulatory Visit (HOSPITAL_BASED_OUTPATIENT_CLINIC_OR_DEPARTMENT_OTHER): Payer: Self-pay

## 2022-06-30 DIAGNOSIS — C49A4 Gastrointestinal stromal tumor of large intestine: Secondary | ICD-10-CM

## 2022-07-10 ENCOUNTER — Other Ambulatory Visit (HOSPITAL_BASED_OUTPATIENT_CLINIC_OR_DEPARTMENT_OTHER): Payer: Self-pay

## 2022-07-11 ENCOUNTER — Telehealth: Payer: Self-pay | Admitting: *Deleted

## 2022-07-11 ENCOUNTER — Inpatient Hospital Stay: Payer: Medicare HMO | Attending: Oncology | Admitting: Oncology

## 2022-07-11 DIAGNOSIS — C49A4 Gastrointestinal stromal tumor of large intestine: Secondary | ICD-10-CM | POA: Diagnosis not present

## 2022-07-11 MED ORDER — HYDROMORPHONE HCL 4 MG PO TABS: 8.0000 mg | ORAL_TABLET | ORAL | 0 refills | Status: DC | PRN

## 2022-07-11 MED ORDER — VALACYCLOVIR HCL 1 G PO TABS: 1000.0000 mg | ORAL_TABLET | Freq: Three times a day (TID) | ORAL | 0 refills | Status: DC

## 2022-07-11 NOTE — Progress Notes (Signed)
[place] OFFICE VISIT PROGRESS NOTE  I connected with Brianna Walker on 07/11/22 at 10:40 AM EDT by telephone and verified that I am speaking with the correct person using two identifiers.   I discussed the limitations, risks, security and privacy concerns of performing an evaluation and management service by telemedicine and the availability of in-person appointments. I also discussed with the patient that there may be a patient responsible charge related to this service. The patient expressed understanding and agreed to proceed.  Other persons participating in the visit and their role in the encounter: Sister  Patient's location: Home Provider's location: Office    Diagnosis: Gastrointestinal stromal tumor  INTERVAL HISTORY:   Brianna Walker is seen today for a telehealth visit per her request.  She is enrolled in home hospice care.  She reports the hospice nurse is scheduled to visit later today.  Her pain is generally under good control with MS Contin and Dilaudid.  She takes Dilaudid approximately 3 times per day for breakthrough pain.  Her bowel movements have diminished.  She has a poor appetite.  She stays in bed most of the day. She developed a painful "blister "type rash at the left thigh beginning 07/08/2022.  She thinks she may have shingles.    Lab Results  Component Value Date   WBC 6.6 05/11/2022   HGB 9.8 (L) 05/11/2022   HCT 31.9 (L) 05/11/2022   MCV 78.0 (L) 05/11/2022   PLT 439 (H) 05/11/2022   NEUTROABS 4.0 05/11/2022    Medications: I have reviewed the patient's current medications.  Assessment/Plan: Gastrointestinal stromal tumor of the rectum Status post an endoscopic ultrasound on 08/06/2014 confirming a mass abutting the distal rectal wall with an FNA biopsy confirming a gastrointestinal stromal tumor Initiation of Gleevec 09/04/2014 MRI pelvis 12/15/2014 with interval decreased size of the large  anorectal mass with central necrosis. Continuation of Gleevec Gleevec placed on hold 06/02/2015 in anticipation of surgery 06/04/2015 status post low anterior resection and diverting loop ileostomy, resection of tumor Pathology: 9.5 cm tumor; GISTsubtype spindle; mitotic rate 1/50 high-power field; positive margin of resection; c-Kit mutation detected (Exon 13: c.[id number] (p.[id number])) Adjuvant Gleevec 08/19/2015 (three-year in total course planned) Ileostomy takedown 01/28/2016 Held 01/28/2016 through 02/14/2016, resumed 02/15/2016 Gleevec stop 04/11/2016-04/18/2016, and 04/26/2016-05/08/2016 Gleevec resumed at a dose of 200 mg daily on 05/09/2016 Gleevec dose increased to 300 mg daily beginning 06/20/2016 Gleevec dose decreased to 200 mg daily beginning 06/23/2016 (poor tolerance at 300 mg dose) CTs 11/22/2017- enhancing soft tissue mass in the presacral space measuring 5.4 x 3.5 cm.  Enlarged pelvic lymph nodes. Gleevec continued at a dose of 200 mg daily Restaging CTs 03/11/2018- interval decrease in size of the presacral soft tissue mass.  Stable small pelvic lymph nodes. Gleevec discontinued 03/13/2018 CT 11/14/2018- new 2.4 cm left pelvic nodule at the piriformis muscle, stable presacral lymph nodes, right abdominal wall hernia, ventral hernia Gleevec 200 mg daily 12/04/2018 CTs 03/31/2019 -decrease in size of soft tissue nodule deep left pelvis.  5 nodules in the sigmoid mesocolon stable to slightly larger.  Stable presacral soft tissue thickening.  No findings for new metastatic disease. CT abdomen/pelvis 09/01/2019-progressive presacral and left perirectal nodularity.  Additional mesenteric nodules along the sigmoid mesocolon and left pelvis unchanged. Gleevec dose escalated to 300 mg daily 09/02/2019 Gleevec dose escalated to 400 mg alternating with 300 mg beginning 09/16/2019 Gleevec 300 mg daily beginning 09/30/2019 (unable to tolerate 400 mg due to  diarrhea) CT 12/15/2019-pelvic/peritoneal  nodularity-unchanged CT 06/02/2020-mildly worsened soft tissue density between the lower rectum and the sacrum. Mildly worsened superior anorectal adenopathy and perirectal adenopathy.  Office review of CT consistent with stable disease Gleevec 300 mg daily continued Gleevec dose reduced to 200 mg daily beginning 01/07/2021 (diarrhea) Gleevec placed on hold 01/27/2021 due to diarrhea CT abdomen/pelvis 03/04/2021-no irregular annular wall thickening in the remnant rectum contiguous with presacral space, compatible with recurrent tumor.  Small rectovaginal fistula identified.  Small amount of gas and rectal contrast in the vagina and vaginal introitus.  Interval progression of metastatic adenopathy in the left perirectal, lower left pelvic sidewall and high presacral chains. Sunitinib 37.5 mg daily beginning 04/04/2021 Sunitinib dose reduced to 25 mg daily 05/06/2021 due to diarrhea, mild neutropenia (she began the reduced dose on 05/15/2021) Sutent placed on hold 05/19/2021 due to diarrhea, weight loss, failure to thrive Sunitinib resumed 25 mg daily 05/26/2021 Sunitinib discontinued 05/30/2021 secondary to persistent diarrhea CTs 06/15/2021-similar appearance of annular wall thickening with luminal narrowing in the remnant rectum.  Previously noted small rectovaginal fistula again identified with gas noted within the lumen of the vagina.  Mild increase in the size of left perirectal left pelvic sidewall soft tissue nodules.  Large lobulated mass within the presacral soft tissue region not significantly changed in the interval. Regorafenib 06/22/2021-discontinued 07/01/2021 secondary to diarrhea Regorafenib resumed at a dose of 40 mg daily 07/08/2021 Regorafenib cycle completed 08/30/2021 Regorafenib cycle started 09/08/2021 Regorafenib placed on hold 09/29/2021 CTs 10/13/2021-increase size of left perianal, left perirectal and presacral soft tissue masses Ripretinib 10/22/2021 CT abdomen/pelvis 12/24/2021-interval increase  in size of the index lesions within the presacral, left perianal and left perirectal regions.  Stable enlarged left posterior pelvic sidewall lymph node. Repretinib continued CT abdomen/pelvis 04/19/2022-worsening of disease with enlargement of multifocal disease in the pelvis.  No signs of new metastatic disease. Ripretinib discontinued, hospice referral made Remote history of pulmonary embolism-maintained on apixaban Multiple colon polyps noted on the colonoscopy 07/21/2014 with the pathology revealing tubular adenomas Hospitalization 06/21/2015 through 07/06/2015 with nausea/vomiting, high ileostomy output; found to have delayed gastric emptying. Water soluble contrast enema 08/13/2015 positive for a small to moderate volume of water soluble contrast leakage from the low anterior resection and anastomosis site in the pelvis Ileostomy takedown and resection of cystic abdominal wall mass ( benign pathology) on 01/28/2016 Anemia with ferritin in the low normal range 05/23/2016 and 06/20/2016. Ferrous sulfate increased to twice daily 06/26/2016. Improved.      Disposition: Ms. Coston has metastatic gastrointestinal stromal tumor.  She is symptomatic with pain related to disease progression in the pelvis.  She will continue MS Contin and Dilaudid for pain.  She is followed by [facility] for home hospice.  She would like to schedule a telephone visit for 3-4 weeks.  I refilled her prescription for Dilaudid.  She will ask the hospice RN to take a picture of the left thigh rash.  We will prescribe a course of Valtrex if the rash appears consistent with a herpes zoster infection.   I discussed the assessment and treatment plan with the patient. The patient was provided an opportunity to ask questions and all were answered. The patient agreed with the plan and demonstrated an understanding of the instructions.   The patient was advised to call back or seek an in-person evaluation if the symptoms  worsen or if the condition fails to improve as anticipated.  I provided 20  minutes chart review, telephone, and documentation  time during this encounter, and > 50% was spent counseling as documented under my assessment & plan.  Betsy Coder MD  07/11/2022 11:29 AM

## 2022-07-11 NOTE — Telephone Encounter (Signed)
Call from hospice RN that confirms she has shingles on her left leg from shin to ankle. Started on Saturday. Per Dr. Benay Spice: Start Valtrex 1000 mg tidy x 7 days. MD still requests photo be sent via [place].  Brianna Walker needs refill on her MS Contin. Will get Dr. Jenene Slicker to order.

## 2022-07-21 ENCOUNTER — Other Ambulatory Visit: Payer: Self-pay | Admitting: Nurse Practitioner

## 2022-07-21 ENCOUNTER — Other Ambulatory Visit (HOSPITAL_BASED_OUTPATIENT_CLINIC_OR_DEPARTMENT_OTHER): Payer: Self-pay

## 2022-07-21 ENCOUNTER — Telehealth: Payer: Self-pay

## 2022-07-21 DIAGNOSIS — C49A4 Gastrointestinal stromal tumor of large intestine: Secondary | ICD-10-CM

## 2022-07-21 MED ORDER — HYDROMORPHONE HCL 4 MG PO TABS: 8.0000 mg | ORAL_TABLET | ORAL | 0 refills | Status: DC | PRN

## 2022-07-21 MED ORDER — HYDROMORPHONE HCL 4 MG PO TABS
12.0000 mg | ORAL_TABLET | ORAL | 0 refills | Status: DC | PRN
  Filled 2022-07-21: qty 252, 14d supply, fill #0

## 2022-07-21 NOTE — Telephone Encounter (Signed)
Patient called and requested a refill of her  dilaudid, I placed the request on Lisa's desk.

## 2022-08-01 ENCOUNTER — Inpatient Hospital Stay: Payer: Medicare HMO | Attending: Oncology | Admitting: Nurse Practitioner

## 2022-08-01 ENCOUNTER — Other Ambulatory Visit: Payer: Self-pay | Admitting: Nurse Practitioner

## 2022-08-01 DIAGNOSIS — C49A4 Gastrointestinal stromal tumor of large intestine: Secondary | ICD-10-CM | POA: Diagnosis not present

## 2022-08-01 NOTE — Progress Notes (Signed)
[place] OFFICE VISIT PROGRESS NOTE  I connected with [place] on 08/01/22 at  9:45 AM EDT by telephone visit and verified that I am speaking with the correct person using two identifiers.   I discussed the limitations, risks, security and privacy concerns of performing an evaluation and management service by telemedicine and the availability of in-person appointments. I also discussed with the patient that there may be a patient responsible charge related to this service. The patient expressed understanding and agreed to proceed.  Other persons participating in the visit and their role in the encounter:  None  Patient's location: Home Provider's location: Office   Diagnosis: Gastrointestinal stromal tumor  INTERVAL HISTORY:   Brianna Walker reports that overall she is doing well.  She feels her pain is adequately controlled with a combination of MS Contin and Dilaudid 3 times a day.  Bowels are moving.  She denies nausea.  She describes her appetite as "pretty good".  She estimates eating 1-1/2 to 2 meals a day.  She is up intermittently throughout the day.  Reports she is able to walk independently to the bathroom and kitchen.  She eats most of her meals in her bedroom.  She has had a few falls.  She has a walker.  The shingles rash is "clearing up".   Lab Results:  Lab Results  Component Value Date   WBC 6.6 05/11/2022   HGB 9.8 (L) 05/11/2022   HCT 31.9 (L) 05/11/2022   MCV 78.0 (L) 05/11/2022   PLT 439 (H) 05/11/2022   NEUTROABS 4.0 05/11/2022    Imaging:  No results found.  Medications: I have reviewed the patient's current medications.  Assessment/Plan: Gastrointestinal stromal tumor of the rectum Status post an endoscopic ultrasound on 08/06/2014 confirming a mass abutting the distal rectal wall with an FNA biopsy confirming a gastrointestinal stromal tumor Initiation of Gleevec 09/04/2014 MRI pelvis  12/15/2014 with interval decreased size of the large anorectal mass with central necrosis. Continuation of Gleevec Gleevec placed on hold 06/02/2015 in anticipation of surgery 06/04/2015 status post low anterior resection and diverting loop ileostomy, resection of tumor Pathology: 9.5 cm tumor; GISTsubtype spindle; mitotic rate 1/50 high-power field; positive margin of resection; c-Kit mutation detected (Exon 13: c.1924A>G (p.K642E)) Adjuvant Gleevec 08/19/2015 (three-year in total course planned) Ileostomy takedown 01/28/2016 Held 01/28/2016 through 02/14/2016, resumed 02/15/2016 Gleevec stop 04/11/2016-04/18/2016, and 04/26/2016-05/08/2016 Gleevec resumed at a dose of 200 mg daily on 05/09/2016 Gleevec dose increased to 300 mg daily beginning 06/20/2016 Gleevec dose decreased to 200 mg daily beginning 06/23/2016 (poor tolerance at 300 mg dose) CTs 11/22/2017- enhancing soft tissue mass in the presacral space measuring 5.4 x 3.5 cm.  Enlarged pelvic lymph nodes. Gleevec continued at a dose of 200 mg daily Restaging CTs 03/11/2018- interval decrease in size of the presacral soft tissue mass.  Stable small pelvic lymph nodes. Gleevec discontinued 03/13/2018 CT 11/14/2018- new 2.4 cm left pelvic nodule at the piriformis muscle, stable presacral lymph nodes, right abdominal wall hernia, ventral hernia Gleevec 200 mg daily 12/04/2018 CTs 03/31/2019 -decrease in size of soft tissue nodule deep left pelvis.  5 nodules in the sigmoid mesocolon stable to slightly larger.  Stable presacral soft tissue thickening.  No findings for new metastatic disease. CT abdomen/pelvis 09/01/2019-progressive presacral and left perirectal nodularity.  Additional mesenteric nodules along the sigmoid mesocolon and left pelvis unchanged. Gleevec dose escalated to 300 mg daily 09/02/2019 Gleevec dose escalated to 400 mg alternating  with 300 mg beginning 09/16/2019 Gleevec 300 mg daily beginning 09/30/2019 (unable to tolerate 400 mg  due to diarrhea) CT 12/15/2019-pelvic/peritoneal nodularity-unchanged CT 06/02/2020-mildly worsened soft tissue density between the lower rectum and the sacrum. Mildly worsened superior anorectal adenopathy and perirectal adenopathy.  Office review of CT consistent with stable disease Gleevec 300 mg daily continued Gleevec dose reduced to 200 mg daily beginning 01/07/2021 (diarrhea) Gleevec placed on hold 01/27/2021 due to diarrhea CT abdomen/pelvis 03/04/2021-no irregular annular wall thickening in the remnant rectum contiguous with presacral space, compatible with recurrent tumor.  Small rectovaginal fistula identified.  Small amount of gas and rectal contrast in the vagina and vaginal introitus.  Interval progression of metastatic adenopathy in the left perirectal, lower left pelvic sidewall and high presacral chains. Sunitinib 37.5 mg daily beginning 04/04/2021 Sunitinib dose reduced to 25 mg daily 05/06/2021 due to diarrhea, mild neutropenia (she began the reduced dose on 05/15/2021) Sutent placed on hold 05/19/2021 due to diarrhea, weight loss, failure to thrive Sunitinib resumed 25 mg daily 05/26/2021 Sunitinib discontinued 05/30/2021 secondary to persistent diarrhea CTs 06/15/2021-similar appearance of annular wall thickening with luminal narrowing in the remnant rectum.  Previously noted small rectovaginal fistula again identified with gas noted within the lumen of the vagina.  Mild increase in the size of left perirectal left pelvic sidewall soft tissue nodules.  Large lobulated mass within the presacral soft tissue region not significantly changed in the interval. Regorafenib 06/22/2021-discontinued 07/01/2021 secondary to diarrhea Regorafenib resumed at a dose of 40 mg daily 07/08/2021 Regorafenib cycle completed 08/30/2021 Regorafenib cycle started 09/08/2021 Regorafenib placed on hold 09/29/2021 CTs 10/13/2021-increase size of left perianal, left perirectal and presacral soft tissue masses Ripretinib  10/22/2021 CT abdomen/pelvis 12/24/2021-interval increase in size of the index lesions within the presacral, left perianal and left perirectal regions.  Stable enlarged left posterior pelvic sidewall lymph node. Repretinib continued CT abdomen/pelvis 04/19/2022-worsening of disease with enlargement of multifocal disease in the pelvis.  No signs of new metastatic disease. Ripretinib discontinued, hospice referral made Remote history of pulmonary embolism-maintained on apixaban Multiple colon polyps noted on the colonoscopy 07/21/2014 with the pathology revealing tubular adenomas Hospitalization 06/21/2015 through 07/06/2015 with nausea/vomiting, high ileostomy output; found to have delayed gastric emptying. Water soluble contrast enema 08/13/2015 positive for a small to moderate volume of water soluble contrast leakage from the low anterior resection and anastomosis site in the pelvis Ileostomy takedown and resection of cystic abdominal wall mass ( benign pathology) on 01/28/2016 Anemia with ferritin in the low normal range 05/23/2016 and 06/20/2016. Ferrous sulfate increased to twice daily 06/26/2016. Improved.    Disposition: Brianna Walker has metastatic gastrointestinal tumor.  She is followed by the hospice program.  She is currently taking MS Contin and Dilaudid for pain and reports overall good relief.  She would like to continue telephone visits every 3 to 4 weeks.   I discussed the assessment and treatment plan with the patient. The patient was provided an opportunity to ask questions and all were answered. The patient agreed with the plan and demonstrated an understanding of the instructions.   The patient was advised to call back or seek an in-person evaluation if the symptoms worsen or if the condition fails to improve as anticipated.  I provided 15 minutes of non face-to-face telephone visit time during this encounter, and > 50% was spent counseling as documented under my assessment &  plan.  Ned Card ANP/GNP-BC   08/01/2022 9:09 AM

## 2022-08-02 ENCOUNTER — Other Ambulatory Visit (HOSPITAL_BASED_OUTPATIENT_CLINIC_OR_DEPARTMENT_OTHER): Payer: Self-pay

## 2022-08-04 ENCOUNTER — Other Ambulatory Visit: Payer: Self-pay | Admitting: Nurse Practitioner

## 2022-08-04 ENCOUNTER — Other Ambulatory Visit (HOSPITAL_BASED_OUTPATIENT_CLINIC_OR_DEPARTMENT_OTHER): Payer: Self-pay

## 2022-08-04 DIAGNOSIS — C49A4 Gastrointestinal stromal tumor of large intestine: Secondary | ICD-10-CM

## 2022-08-04 MED ORDER — HYDROMORPHONE HCL 4 MG PO TABS
8.0000 mg | ORAL_TABLET | ORAL | 0 refills | Status: DC | PRN
  Filled 2022-08-04: qty 100, 6d supply, fill #0

## 2022-08-06 ENCOUNTER — Emergency Department (HOSPITAL_COMMUNITY)

## 2022-08-06 ENCOUNTER — Encounter (HOSPITAL_COMMUNITY): Payer: Self-pay

## 2022-08-06 ENCOUNTER — Inpatient Hospital Stay (HOSPITAL_COMMUNITY)

## 2022-08-06 ENCOUNTER — Inpatient Hospital Stay (HOSPITAL_COMMUNITY): Admitting: Certified Registered Nurse Anesthetist

## 2022-08-06 ENCOUNTER — Other Ambulatory Visit: Payer: Self-pay

## 2022-08-06 ENCOUNTER — Encounter (HOSPITAL_COMMUNITY)
Admission: EM | Disposition: A | Discharge status: Hospice | Payer: Self-pay | Source: Home / Self Care | Attending: Family Medicine

## 2022-08-06 ENCOUNTER — Inpatient Hospital Stay (HOSPITAL_COMMUNITY)
Admission: EM | Admit: 2022-08-06 | Discharge: 2022-08-09 | DRG: 480 | Disposition: A | Discharge status: Hospice | Attending: Internal Medicine | Admitting: Internal Medicine

## 2022-08-06 DIAGNOSIS — Z800 Family history of malignant neoplasm of digestive organs: Secondary | ICD-10-CM

## 2022-08-06 DIAGNOSIS — Z96641 Presence of right artificial hip joint: Secondary | ICD-10-CM | POA: Diagnosis present

## 2022-08-06 DIAGNOSIS — E785 Hyperlipidemia, unspecified: Secondary | ICD-10-CM | POA: Diagnosis present

## 2022-08-06 DIAGNOSIS — G8929 Other chronic pain: Secondary | ICD-10-CM | POA: Diagnosis present

## 2022-08-06 DIAGNOSIS — Z833 Family history of diabetes mellitus: Secondary | ICD-10-CM | POA: Diagnosis not present

## 2022-08-06 DIAGNOSIS — Z85831 Personal history of malignant neoplasm of soft tissue: Secondary | ICD-10-CM | POA: Diagnosis not present

## 2022-08-06 DIAGNOSIS — Z8672 Personal history of thrombophlebitis: Secondary | ICD-10-CM | POA: Diagnosis not present

## 2022-08-06 DIAGNOSIS — C189 Malignant neoplasm of colon, unspecified: Secondary | ICD-10-CM | POA: Diagnosis not present

## 2022-08-06 DIAGNOSIS — Y92009 Unspecified place in unspecified non-institutional (private) residence as the place of occurrence of the external cause: Secondary | ICD-10-CM

## 2022-08-06 DIAGNOSIS — K219 Gastro-esophageal reflux disease without esophagitis: Secondary | ICD-10-CM | POA: Diagnosis present

## 2022-08-06 DIAGNOSIS — S79929A Unspecified injury of unspecified thigh, initial encounter: Secondary | ICD-10-CM | POA: Diagnosis not present

## 2022-08-06 DIAGNOSIS — Z01818 Encounter for other preprocedural examination: Secondary | ICD-10-CM | POA: Diagnosis not present

## 2022-08-06 DIAGNOSIS — Z66 Do not resuscitate: Secondary | ICD-10-CM | POA: Diagnosis not present

## 2022-08-06 DIAGNOSIS — Z79899 Other long term (current) drug therapy: Secondary | ICD-10-CM | POA: Diagnosis not present

## 2022-08-06 DIAGNOSIS — Z043 Encounter for examination and observation following other accident: Secondary | ICD-10-CM | POA: Diagnosis not present

## 2022-08-06 DIAGNOSIS — Z79818 Long term (current) use of other agents affecting estrogen receptors and estrogen levels: Secondary | ICD-10-CM

## 2022-08-06 DIAGNOSIS — L8990 Pressure ulcer of unspecified site, unspecified stage: Secondary | ICD-10-CM | POA: Insufficient documentation

## 2022-08-06 DIAGNOSIS — W1830XA Fall on same level, unspecified, initial encounter: Secondary | ICD-10-CM | POA: Diagnosis present

## 2022-08-06 DIAGNOSIS — L89153 Pressure ulcer of sacral region, stage 3: Secondary | ICD-10-CM | POA: Diagnosis not present

## 2022-08-06 DIAGNOSIS — Z8249 Family history of ischemic heart disease and other diseases of the circulatory system: Secondary | ICD-10-CM

## 2022-08-06 DIAGNOSIS — Z79891 Long term (current) use of opiate analgesic: Secondary | ICD-10-CM

## 2022-08-06 DIAGNOSIS — Z86711 Personal history of pulmonary embolism: Secondary | ICD-10-CM

## 2022-08-06 DIAGNOSIS — C49A4 Gastrointestinal stromal tumor of large intestine: Secondary | ICD-10-CM | POA: Diagnosis present

## 2022-08-06 DIAGNOSIS — S72002A Fracture of unspecified part of neck of left femur, initial encounter for closed fracture: Secondary | ICD-10-CM | POA: Diagnosis not present

## 2022-08-06 DIAGNOSIS — I10 Essential (primary) hypertension: Secondary | ICD-10-CM | POA: Diagnosis not present

## 2022-08-06 DIAGNOSIS — Z8611 Personal history of tuberculosis: Secondary | ICD-10-CM | POA: Diagnosis not present

## 2022-08-06 DIAGNOSIS — E7849 Other hyperlipidemia: Secondary | ICD-10-CM | POA: Diagnosis not present

## 2022-08-06 DIAGNOSIS — S72142A Displaced intertrochanteric fracture of left femur, initial encounter for closed fracture: Secondary | ICD-10-CM | POA: Diagnosis not present

## 2022-08-06 DIAGNOSIS — Z86718 Personal history of other venous thrombosis and embolism: Secondary | ICD-10-CM | POA: Diagnosis not present

## 2022-08-06 DIAGNOSIS — D649 Anemia, unspecified: Secondary | ICD-10-CM | POA: Diagnosis present

## 2022-08-06 DIAGNOSIS — Z7901 Long term (current) use of anticoagulants: Secondary | ICD-10-CM

## 2022-08-06 DIAGNOSIS — Z87891 Personal history of nicotine dependence: Secondary | ICD-10-CM

## 2022-08-06 DIAGNOSIS — Z515 Encounter for palliative care: Secondary | ICD-10-CM | POA: Diagnosis not present

## 2022-08-06 DIAGNOSIS — R000 Tachycardia, unspecified: Secondary | ICD-10-CM | POA: Diagnosis not present

## 2022-08-06 DIAGNOSIS — W19XXXA Unspecified fall, initial encounter: Secondary | ICD-10-CM | POA: Diagnosis not present

## 2022-08-06 HISTORY — PX: FEMUR IM NAIL: SHX1597

## 2022-08-06 LAB — CBC
HCT: 27.4 % — ABNORMAL LOW (ref 36.0–46.0)
HCT: 22.4 % — ABNORMAL LOW (ref 36.0–46.0)
Hemoglobin: 8 g/dL — ABNORMAL LOW (ref 12.0–15.0)
Hemoglobin: 6.5 g/dL — CL (ref 12.0–15.0)
MCH: 24.2 pg — ABNORMAL LOW (ref 26.0–34.0)
MCH: 24.3 pg — ABNORMAL LOW (ref 26.0–34.0)
MCHC: 29.2 g/dL — ABNORMAL LOW (ref 30.0–36.0)
MCHC: 29 g/dL — ABNORMAL LOW (ref 30.0–36.0)
MCV: 83 fL (ref 80.0–100.0)
MCV: 83.6 fL (ref 80.0–100.0)
Platelets: 312 10*3/uL (ref 150–400)
Platelets: 234 10*3/uL (ref 150–400)
RBC: 3.3 MIL/uL — ABNORMAL LOW (ref 3.87–5.11)
RBC: 2.68 MIL/uL — ABNORMAL LOW (ref 3.87–5.11)
RDW: 19.7 % — ABNORMAL HIGH (ref 11.5–15.5)
RDW: 19.9 % — ABNORMAL HIGH (ref 11.5–15.5)
WBC: 8.6 10*3/uL (ref 4.0–10.5)
WBC: 5.8 10*3/uL (ref 4.0–10.5)
nRBC: 0 % (ref 0.0–0.2)
nRBC: 0 % (ref 0.0–0.2)

## 2022-08-06 LAB — COMPREHENSIVE METABOLIC PANEL
ALT: 16 U/L (ref 0–44)
AST: 28 U/L (ref 15–41)
Albumin: 2.2 g/dL — ABNORMAL LOW (ref 3.5–5.0)
Alkaline Phosphatase: 77 U/L (ref 38–126)
Anion gap: 12 (ref 5–15)
BUN: 14 mg/dL (ref 8–23)
CO2: 20 mmol/L — ABNORMAL LOW (ref 22–32)
Calcium: 8.6 mg/dL — ABNORMAL LOW (ref 8.9–10.3)
Chloride: 110 mmol/L (ref 98–111)
Creatinine, Ser: 0.98 mg/dL (ref 0.44–1.00)
GFR, Estimated: >60 mL/min (ref >60)
Glucose, Bld: 99 mg/dL (ref 70–99)
Potassium: 4.6 mmol/L (ref 3.5–5.1)
Sodium: 142 mmol/L (ref 135–145)
Total Bilirubin: 0.7 mg/dL (ref 0.3–1.2)
Total Protein: 5.5 g/dL — ABNORMAL LOW (ref 6.5–8.1)

## 2022-08-06 LAB — TYPE AND SCREEN

## 2022-08-06 LAB — BASIC METABOLIC PANEL
Anion gap: 6 (ref 5–15)
BUN: 14 mg/dL (ref 8–23)
CO2: 20 mmol/L — ABNORMAL LOW (ref 22–32)
Calcium: 7.8 mg/dL — ABNORMAL LOW (ref 8.9–10.3)
Chloride: 116 mmol/L — ABNORMAL HIGH (ref 98–111)
Creatinine, Ser: 0.91 mg/dL (ref 0.44–1.00)
GFR, Estimated: >60 mL/min (ref >60)
Glucose, Bld: 84 mg/dL (ref 70–99)
Potassium: 4.4 mmol/L (ref 3.5–5.1)
Sodium: 142 mmol/L (ref 135–145)

## 2022-08-06 LAB — PREPARE RBC (CROSSMATCH)

## 2022-08-06 LAB — BPAM RBC
Blood Product Expiration Date: 202405072359
Unit Type and Rh: 5100

## 2022-08-06 LAB — SURGICAL PCR SCREEN
MRSA, PCR: NEGATIVE
Staphylococcus aureus: NEGATIVE

## 2022-08-06 SURGERY — INSERTION, INTRAMEDULLARY ROD, FEMUR
Anesthesia: General | Site: Hip | Laterality: Left

## 2022-08-06 MED ORDER — ONDANSETRON HCL 4 MG/2ML IJ SOLN
INTRAMUSCULAR | Status: DC | PRN
  Administered 2022-08-06: 4 mg via INTRAVENOUS

## 2022-08-06 MED ORDER — HYDROMORPHONE HCL 1 MG/ML IJ SOLN
1.0000 mg | Freq: Once | INTRAMUSCULAR | Status: AC
  Administered 2022-08-06: 1 mg via INTRAVENOUS
  Filled 2022-08-06: qty 1

## 2022-08-06 MED ORDER — METOCLOPRAMIDE HCL 5 MG/ML IJ SOLN: 5.0000 mg | Freq: Three times a day (TID) | INTRAMUSCULAR | Status: DC | PRN

## 2022-08-06 MED ORDER — DOCUSATE SODIUM 100 MG PO CAPS
100.0000 mg | ORAL_CAPSULE | Freq: Two times a day (BID) | ORAL | Status: DC
  Administered 2022-08-06 – 2022-08-09 (×6): 100 mg via ORAL
  Filled 2022-08-06 (×6): qty 1

## 2022-08-06 MED ORDER — POLYETHYLENE GLYCOL 3350 17 G PO PACK: 17.0000 g | PACK | Freq: Every day | ORAL | Status: DC | PRN

## 2022-08-06 MED ORDER — METHOCARBAMOL 1000 MG/10ML IJ SOLN: 500.0000 mg | Freq: Four times a day (QID) | INTRAVENOUS | Status: DC | PRN

## 2022-08-06 MED ORDER — ACETAMINOPHEN 325 MG PO TABS: 650.0000 mg | ORAL_TABLET | Freq: Four times a day (QID) | ORAL | Status: DC | PRN

## 2022-08-06 MED ORDER — HYDROMORPHONE HCL 1 MG/ML IJ SOLN
0.5000 mg | INTRAMUSCULAR | Status: DC | PRN
  Administered 2022-08-06 – 2022-08-08 (×8): 1 mg via INTRAVENOUS
  Filled 2022-08-06 (×8): qty 1

## 2022-08-06 MED ORDER — CHLORHEXIDINE GLUCONATE 0.12 % MT SOLN
OROMUCOSAL | Status: AC
  Administered 2022-08-06: 15 mL via OROMUCOSAL
  Filled 2022-08-06: qty 15

## 2022-08-06 MED ORDER — BISACODYL 10 MG RE SUPP: 10.0000 mg | Freq: Every day | RECTAL | Status: DC | PRN

## 2022-08-06 MED ORDER — STERILE WATER FOR IRRIGATION IR SOLN
Status: DC | PRN
  Administered 2022-08-06: 1000 mL

## 2022-08-06 MED ORDER — SODIUM CHLORIDE 0.9 % IV SOLN: INTRAVENOUS | Status: AC

## 2022-08-06 MED ORDER — METHOCARBAMOL 500 MG PO TABS
500.0000 mg | ORAL_TABLET | Freq: Four times a day (QID) | ORAL | Status: DC | PRN
  Administered 2022-08-06 – 2022-08-09 (×8): 500 mg via ORAL
  Filled 2022-08-06 (×9): qty 1

## 2022-08-06 MED ORDER — PROPOFOL 10 MG/ML IV BOLUS
INTRAVENOUS | Status: DC | PRN
  Administered 2022-08-06: 60 mg via INTRAVENOUS

## 2022-08-06 MED ORDER — CHLORHEXIDINE GLUCONATE 0.12 % MT SOLN
OROMUCOSAL | Status: AC
  Filled 2022-08-06: qty 15

## 2022-08-06 MED ORDER — ACETAMINOPHEN 500 MG PO TABS
ORAL_TABLET | ORAL | Status: AC
  Administered 2022-08-06: 1000 mg via ORAL
  Filled 2022-08-06: qty 2

## 2022-08-06 MED ORDER — CEFAZOLIN SODIUM-DEXTROSE 2-4 GM/100ML-% IV SOLN
2.0000 g | Freq: Four times a day (QID) | INTRAVENOUS | Status: AC
  Administered 2022-08-06 (×2): 2 g via INTRAVENOUS
  Filled 2022-08-06 (×2): qty 100

## 2022-08-06 MED ORDER — PANTOPRAZOLE SODIUM 40 MG PO TBEC
40.0000 mg | DELAYED_RELEASE_TABLET | Freq: Every day | ORAL | Status: DC
  Administered 2022-08-06 – 2022-08-09 (×4): 40 mg via ORAL
  Filled 2022-08-06 (×4): qty 1

## 2022-08-06 MED ORDER — ONDANSETRON HCL 4 MG/2ML IJ SOLN: 4.0000 mg | Freq: Four times a day (QID) | INTRAMUSCULAR | Status: DC | PRN

## 2022-08-06 MED ORDER — TRANEXAMIC ACID-NACL 1000-0.7 MG/100ML-% IV SOLN
1000.0000 mg | Freq: Once | INTRAVENOUS | Status: AC
  Administered 2022-08-06: 1000 mg via INTRAVENOUS
  Filled 2022-08-06: qty 100

## 2022-08-06 MED ORDER — METOCLOPRAMIDE HCL 5 MG PO TABS: 5.0000 mg | ORAL_TABLET | Freq: Three times a day (TID) | ORAL | Status: DC | PRN

## 2022-08-06 MED ORDER — LACTATED RINGERS IV SOLN: INTRAVENOUS | Status: DC

## 2022-08-06 MED ORDER — ALUM & MAG HYDROXIDE-SIMETH 200-200-20 MG/5ML PO SUSP: 30.0000 mL | ORAL | Status: DC | PRN

## 2022-08-06 MED ORDER — FENTANYL CITRATE (PF) 250 MCG/5ML IJ SOLN
INTRAMUSCULAR | Status: DC | PRN
  Administered 2022-08-06 (×3): 50 ug via INTRAVENOUS

## 2022-08-06 MED ORDER — SODIUM CHLORIDE 0.9% IV SOLUTION: Freq: Once | INTRAVENOUS | Status: AC

## 2022-08-06 MED ORDER — MORPHINE SULFATE ER 15 MG PO TBCR
60.0000 mg | EXTENDED_RELEASE_TABLET | Freq: Two times a day (BID) | ORAL | Status: DC
  Administered 2022-08-06 – 2022-08-08 (×5): 60 mg via ORAL
  Filled 2022-08-06 (×5): qty 4

## 2022-08-06 MED ORDER — LIDOCAINE 2% (20 MG/ML) 5 ML SYRINGE
INTRAMUSCULAR | Status: DC | PRN
  Administered 2022-08-06: 30 mg via INTRAVENOUS

## 2022-08-06 MED ORDER — ATORVASTATIN CALCIUM 40 MG PO TABS
40.0000 mg | ORAL_TABLET | Freq: Every day | ORAL | Status: DC
  Administered 2022-08-06 – 2022-08-09 (×4): 40 mg via ORAL
  Filled 2022-08-06 (×4): qty 1

## 2022-08-06 MED ORDER — HYDROMORPHONE HCL 1 MG/ML IJ SOLN
1.0000 mg | INTRAMUSCULAR | Status: DC | PRN
  Administered 2022-08-06: 1 mg via INTRAVENOUS
  Filled 2022-08-06: qty 1

## 2022-08-06 MED ORDER — ACETAMINOPHEN 500 MG PO TABS: 1000.0000 mg | ORAL_TABLET | Freq: Once | ORAL | Status: AC

## 2022-08-06 MED ORDER — ESMOLOL HCL 100 MG/10ML IV SOLN
INTRAVENOUS | Status: DC | PRN
  Administered 2022-08-06 (×2): 10 mg via INTRAVENOUS

## 2022-08-06 MED ORDER — OXYCODONE HCL 5 MG PO TABS
10.0000 mg | ORAL_TABLET | ORAL | Status: DC | PRN
  Administered 2022-08-06 – 2022-08-08 (×9): 10 mg via ORAL
  Filled 2022-08-06 (×10): qty 2

## 2022-08-06 MED ORDER — PREDNISONE 10 MG PO TABS
10.0000 mg | ORAL_TABLET | Freq: Every day | ORAL | Status: DC
  Administered 2022-08-06 – 2022-08-09 (×4): 10 mg via ORAL
  Filled 2022-08-06 (×4): qty 1

## 2022-08-06 MED ORDER — HYDROMORPHONE HCL 1 MG/ML IJ SOLN: 0.2500 mg | INTRAMUSCULAR | Status: DC | PRN

## 2022-08-06 MED ORDER — PHENYLEPHRINE 80 MCG/ML (10ML) SYRINGE FOR IV PUSH (FOR BLOOD PRESSURE SUPPORT)
PREFILLED_SYRINGE | INTRAVENOUS | Status: AC
  Filled 2022-08-06: qty 20

## 2022-08-06 MED ORDER — LIDOCAINE 2% (20 MG/ML) 5 ML SYRINGE
INTRAMUSCULAR | Status: AC
  Filled 2022-08-06: qty 15

## 2022-08-06 MED ORDER — ACETAMINOPHEN 500 MG PO TABS: 1000.0000 mg | ORAL_TABLET | Freq: Four times a day (QID) | ORAL | Status: DC

## 2022-08-06 MED ORDER — FENTANYL CITRATE (PF) 250 MCG/5ML IJ SOLN
INTRAMUSCULAR | Status: AC
  Filled 2022-08-06: qty 5

## 2022-08-06 MED ORDER — PHENOL 1.4 % MT LIQD: 1.0000 | OROMUCOSAL | Status: DC | PRN

## 2022-08-06 MED ORDER — SODIUM CHLORIDE 0.9 % IV BOLUS
500.0000 mL | Freq: Once | INTRAVENOUS | Status: AC
  Administered 2022-08-06: 500 mL via INTRAVENOUS

## 2022-08-06 MED ORDER — SUGAMMADEX SODIUM 200 MG/2ML IV SOLN
INTRAVENOUS | Status: DC | PRN
  Administered 2022-08-06: 100 mg via INTRAVENOUS

## 2022-08-06 MED ORDER — OXYCODONE HCL 5 MG PO TABS
5.0000 mg | ORAL_TABLET | ORAL | Status: DC | PRN
  Filled 2022-08-06: qty 2

## 2022-08-06 MED ORDER — ROCURONIUM BROMIDE 10 MG/ML (PF) SYRINGE
PREFILLED_SYRINGE | INTRAVENOUS | Status: AC
  Filled 2022-08-06: qty 30

## 2022-08-06 MED ORDER — ONDANSETRON HCL 4 MG PO TABS: 4.0000 mg | ORAL_TABLET | Freq: Four times a day (QID) | ORAL | Status: DC | PRN

## 2022-08-06 MED ORDER — CEFAZOLIN SODIUM-DEXTROSE 2-4 GM/100ML-% IV SOLN
INTRAVENOUS | Status: AC
  Filled 2022-08-06: qty 100

## 2022-08-06 MED ORDER — ORAL CARE MOUTH RINSE: 15.0000 mL | Freq: Once | OROMUCOSAL | Status: AC

## 2022-08-06 MED ORDER — SODIUM CHLORIDE 0.9 % IR SOLN
Status: DC | PRN
  Administered 2022-08-06: 1000 mL

## 2022-08-06 MED ORDER — OXYCODONE HCL 5 MG PO TABS
5.0000 mg | ORAL_TABLET | ORAL | Status: DC | PRN
  Filled 2022-08-06: qty 1

## 2022-08-06 MED ORDER — MENTHOL 3 MG MT LOZG: 1.0000 | LOZENGE | OROMUCOSAL | Status: DC | PRN

## 2022-08-06 MED ORDER — TRANEXAMIC ACID-NACL 1000-0.7 MG/100ML-% IV SOLN
INTRAVENOUS | Status: AC
  Filled 2022-08-06: qty 100

## 2022-08-06 MED ORDER — PROPOFOL 10 MG/ML IV BOLUS
INTRAVENOUS | Status: AC
  Filled 2022-08-06: qty 20

## 2022-08-06 MED ORDER — DEXAMETHASONE SODIUM PHOSPHATE 10 MG/ML IJ SOLN
INTRAMUSCULAR | Status: DC | PRN
  Administered 2022-08-06: 5 mg via INTRAVENOUS

## 2022-08-06 MED ORDER — ACETAMINOPHEN 325 MG PO TABS
650.0000 mg | ORAL_TABLET | Freq: Four times a day (QID) | ORAL | Status: AC
  Administered 2022-08-06 – 2022-08-07 (×4): 650 mg via ORAL
  Filled 2022-08-06 (×4): qty 2

## 2022-08-06 MED ORDER — DEXAMETHASONE SODIUM PHOSPHATE 10 MG/ML IJ SOLN
INTRAMUSCULAR | Status: AC
  Filled 2022-08-06: qty 3

## 2022-08-06 MED ORDER — PHENYLEPHRINE 80 MCG/ML (10ML) SYRINGE FOR IV PUSH (FOR BLOOD PRESSURE SUPPORT)
PREFILLED_SYRINGE | INTRAVENOUS | Status: DC | PRN
  Administered 2022-08-06: 80 ug via INTRAVENOUS
  Administered 2022-08-06: 160 ug via INTRAVENOUS

## 2022-08-06 MED ORDER — CHLORHEXIDINE GLUCONATE 0.12 % MT SOLN: 15.0000 mL | Freq: Once | OROMUCOSAL | Status: AC

## 2022-08-06 MED ORDER — ROCURONIUM BROMIDE 10 MG/ML (PF) SYRINGE
PREFILLED_SYRINGE | INTRAVENOUS | Status: DC | PRN
  Administered 2022-08-06: 30 mg via INTRAVENOUS

## 2022-08-06 MED ORDER — METHOCARBAMOL 500 MG PO TABS: 500.0000 mg | ORAL_TABLET | Freq: Four times a day (QID) | ORAL | Status: DC | PRN

## 2022-08-06 MED ORDER — ONDANSETRON HCL 4 MG/2ML IJ SOLN
INTRAMUSCULAR | Status: AC
  Filled 2022-08-06: qty 6

## 2022-08-06 SURGICAL SUPPLY — 44 items
BAG COUNTER SPONGE SURGICOUNT (BAG) ×1 IMPLANT
BAG SPNG CNTER NS LX DISP (BAG) ×1
BIT DRILL AO GAMMA 4.2X340 (BIT) IMPLANT
CLSR STERI-STRIP ANTIMIC 1/2X4 (GAUZE/BANDAGES/DRESSINGS) ×1 IMPLANT
COVER MAYO STAND STRL (DRAPES) ×1 IMPLANT
COVER PERINEAL POST (MISCELLANEOUS) ×1 IMPLANT
COVER SURGICAL LIGHT HANDLE (MISCELLANEOUS) ×1 IMPLANT
DRAPE STERI IOBAN 125X83 (DRAPES) ×1 IMPLANT
DRESSING MEPILEX FLEX 4X4 (GAUZE/BANDAGES/DRESSINGS) ×3 IMPLANT
DRSG MEPILEX FLEX 4X4 (GAUZE/BANDAGES/DRESSINGS) ×3
DURAPREP 26ML APPLICATOR (WOUND CARE) ×1 IMPLANT
ELECT REM PT RETURN 9FT ADLT (ELECTROSURGICAL) ×1
ELECTRODE REM PT RTRN 9FT ADLT (ELECTROSURGICAL) ×1 IMPLANT
GLOVE BIO SURGEON STRL SZ7.5 (GLOVE) ×1 IMPLANT
GLOVE BIOGEL PI IND STRL 7.5 (GLOVE) ×1 IMPLANT
GLOVE BIOGEL PI IND STRL 8 (GLOVE) ×1 IMPLANT
GLOVE SURG SYN 7.5  E (GLOVE)
GLOVE SURG SYN 7.5 E (GLOVE) IMPLANT
GLOVE SURG SYN 7.5 PF PI (GLOVE) ×1 IMPLANT
GOWN STRL REUS W/ TWL LRG LVL3 (GOWN DISPOSABLE) ×2 IMPLANT
GOWN STRL REUS W/ TWL XL LVL3 (GOWN DISPOSABLE) ×2 IMPLANT
GOWN STRL REUS W/TWL LRG LVL3 (GOWN DISPOSABLE)
GOWN STRL REUS W/TWL XL LVL3 (GOWN DISPOSABLE) ×2
K-WIRE  3.2X450M STR (WIRE) ×1
K-WIRE 3.2X450M STR (WIRE) ×1
KIT BASIN OR (CUSTOM PROCEDURE TRAY) ×1 IMPLANT
KIT TURNOVER KIT B (KITS) ×1 IMPLANT
KWIRE 3.2X450M STR (WIRE) IMPLANT
MANIFOLD NEPTUNE II (INSTRUMENTS) ×1 IMPLANT
NAIL KIT TROCH 10X170X125 (Nail) IMPLANT
NS IRRIG 1000ML POUR BTL (IV SOLUTION) ×1 IMPLANT
PACK GENERAL/GYN (CUSTOM PROCEDURE TRAY) ×1 IMPLANT
PAD ARMBOARD 7.5X6 YLW CONV (MISCELLANEOUS) ×2 IMPLANT
SCREW LAG GAMMA 3 TI 10.5X80MM (Screw) IMPLANT
SCREW LOCKING T2 F/T  5X37.5MM (Screw) ×1 IMPLANT
SCREW LOCKING T2 F/T 5X37.5MM (Screw) IMPLANT
STRIP CLOSURE SKIN 1/2X4 (GAUZE/BANDAGES/DRESSINGS) ×1 IMPLANT
SUT MNCRL AB 4-0 PS2 18 (SUTURE) ×1 IMPLANT
SUT VIC AB 0 CT1 36 (SUTURE) IMPLANT
SUT VIC AB 2-0 CT1 27 (SUTURE) ×1
SUT VIC AB 2-0 CT1 TAPERPNT 27 (SUTURE) ×1 IMPLANT
TOWEL GREEN STERILE (TOWEL DISPOSABLE) ×1 IMPLANT
TOWEL OR NON WOVEN STRL DISP B (DISPOSABLE) ×1 IMPLANT
WATER STERILE IRR 1000ML POUR (IV SOLUTION) ×1 IMPLANT

## 2022-08-06 NOTE — Progress Notes (Signed)
    Patient: Carisa Tarantola Clymer AQT:[id number] DOB: 10-12-1945      Brief hospital course: Mrs. Liebling is a 77 y.o. F with metastatic GIST, HTN, and VTE on Eliquis who presented with leg pain after a fall.   4/7: Admitted with hip fracture, taken to the OR    This is a no charge note, for further details, please see the H&P by my partner, Dr. Antionette Char from earlier today.   Principal Problem:   Closed left hip fracture, initial encounter Active Problems:   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   PULMONARY EMBOLISM, HX OF   HTN (hypertension)   Normocytic anemia    To the OR today.         Physical Exam: BP 115/68 (BP Location: Left Arm)   Pulse 86   Temp 98 F (36.7 C)   Resp 18   Ht 5' 4.02" (1.626 m)   Wt 42.1 kg   SpO2 100%   BMI 15.92 kg/m   Patient seen and examined.      Family Communication: None present        Author: Alberteen Sam, MD 08/06/2022 5:17 PM

## 2022-08-06 NOTE — Consult Note (Signed)
ORTHOPAEDIC CONSULTATION  REQUESTING PHYSICIAN: Alberteen Sam, *  Chief Complaint: left hip pain  HPI: Brianna Walker is a 77 y.o. female who complains of pain in the left hip after falling at home. She was in the bathroom getting up from the toilet and her left leg gave out on her. She was unable to stand and bear weight. Currently in hospice for cancer. Has had shingles on the left leg for 3 weeks.   Imaging shows an acute, mildly displaced fracture deformity extending through the inter trochanteric region of the proximal left femur.    Orthopedics was consulted for evaluation.    No history of MI, CVA. + h/o PE & RLE DVT. Previously ambulatory with occasional use of assistive device.  The patient is living at home with family.    Past Medical History:  Diagnosis Date   AKI (acute kidney injury) 06/21/2015   ALLERGIC RHINITIS 09/12/2007   Arthritis    BACK PAIN 06/19/2008   Cramp of limb 08/11/2009   DEEP VENOUS THROMBOPHLEBITIS, LEG, RIGHT 03/04/2010   right leg   Diverticulosis    FREQUENCY, URINARY 10/07/2009   GERD 12/05/2006   gist    dx. 4'16 -oral chemotherapy only.   HEMORRHOIDS 11/17/2006   HIP PAIN, RIGHT, CHRONIC 08/11/2009   HX, PERSONAL, TUBERCULOSIS 11/17/2006   HYPERLIPIDEMIA 09/12/2007   Hyperlipidemia 09/12/2007   Qualifier: Diagnosis of  By: Jonny Ruiz MD, Len Blalock    HYPERTENSION 11/17/2006   INSOMNIA-SLEEP DISORDER-UNSPEC 06/19/2008   Iron deficiency anemia 12/07/2011   LEG PAIN, RIGHT 06/19/2008   Long term (current) use of anticoagulants 11/29/2010   MASS, SUPERFICIAL 09/12/2007   MENOPAUSAL DISORDER 09/20/2009   OVERACTIVE BLADDER 03/04/2010   Perforation of colon 11/17/2015   Pneumonia    PULMONARY EMBOLISM, HX OF 11/17/2006   [facility] s/p Pneumonia   RECTAL BLEEDING 10/07/2007   Rectal mass 06/2014   SCIATICA, RIGHT 09/12/2007   Shortness of breath dyspnea    With exertion since chemotherapy tx-oral meds   SKIN LESION 07/20/2009    TMJ SYNDROME 11/17/2006   Tuberculosis    pt. was tx.   Tubular adenoma of colon 2016   Past Surgical History:  Procedure Laterality Date   ABDOMINAL HYSTERECTOMY     CHOLECYSTECTOMY OPEN     COLONOSCOPY     EUS N/A 08/06/2014   Procedure: LOWER ENDOSCOPIC ULTRASOUND (EUS);  Surgeon: Rachael Fee, MD;  Location: [facility];  Service: Endoscopy;  Laterality: N/A;   EXCISION MASS ABDOMINAL N/A 01/28/2016   Procedure: EXCISION  ABDOMINAL WALL MASS, CYST, QUESTION FAT NECROSIS, QUESTION SEROMA;  Surgeon: Karie Soda, MD;  Location: [facility];  Service: General;  Laterality: N/A;   hemorroid surgery  removed at dr Anselm Jungling office   x 2   ILEOSTOMY CLOSURE N/A 01/28/2016   Procedure: TAKEDOWN OF LOOP ILEOSTOMY;  Surgeon: Karie Soda, MD;  Location: [facility];  Service: General;  Laterality: N/A;   INCISIONAL HERNIA REPAIR  2002     multiple ventral hernia repair/mesh   INCISIONAL HERNIA REPAIR  01/13/2005   open w onlay Proceed mesh.   LAPAROSCOPIC LYSIS OF ADHESIONS  06/04/2015   Procedure: LAPAROSCOPIC LYSIS OF ADHESIONS;  Surgeon: Karie Soda, MD;  Location: [facility];  Service: General;;   PROCTOSCOPY N/A 01/28/2016   Procedure: RIGID PROCTOSCOPY, DILATION OF COLORECTAL ANASTOMOTIC STRICTURE;  Surgeon: Karie Soda, MD;  Location: [facility];  Service: General;  Laterality: N/A;   s/p right hip  replaced min invasive hip surgury [facility] oct 2011 Right 2011   tumor removed off of right shoulder Right    XI ROBOTIC ASSISTED LOWER ANTERIOR RESECTION N/A 06/04/2015   Procedure: XI ROBOTIC ASSISTED LYSIS OF ADHESIONS, LOWER ANTERIOR RESECTION TRANS ABDOMINAL AND TRANS ANAL, COLOANAL HANDSEWN ANASTAOSIS, DIVERTING LOPE ILIOSTOMY, RESECTION GIST TUMOR;  Surgeon: Karie Soda, MD;  Location: [facility];  Service: General;  Laterality: N/A;   Social History   Socioeconomic History   Marital status: Married    Spouse name: Not on file   Number of children: 2   Years of education: Not on file   Highest  education level: Not on file  Occupational History   Occupation: retired  Tobacco Use   Smoking status: Former    Packs/day: 0    Types: Cigarettes    Quit date: 06/09/1977    Years since quitting: 45.1   Smokeless tobacco: Never  Vaping Use   Vaping Use: Never used  Substance and Sexual Activity   Alcohol use: Yes    Alcohol/week: 0.0 standard drinks of alcohol    Comment: rare   Drug use: No    Comment: past marijuana   Sexual activity: Not on file  Other Topics Concern   Not on file  Social History Narrative   Husband civil 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[place]   Has #2 children (one son at home with her)   Prior Museum/gallery exhibitions officer service positions   Social Determinants of Health   Financial Resource Strain: Not on file  Food Insecurity: Not on file  Transportation Needs: No Transportation Needs (12/11/2018)   PRAPARE - Administrator, Civil Service (Medical): No    Lack of Transportation (Non-Medical): No  Physical Activity: Not on file  Stress: Not on file  Social Connections: Not on file   Family History  Problem Relation Age of Onset   Colon cancer Father    Heart disease Mother    Heart disease Sister    Diabetes Sister    Esophageal cancer Neg Hx    Stomach cancer Neg Hx    Rectal cancer Neg Hx    No Known Allergies Prior to Admission medications   Medication Sig Start Date End Date Taking? Authorizing Provider  apixaban (ELIQUIS) 5 MG TABS tablet TAKE 1 TABLET TWICE DAILY 04/02/22  Yes Corwin Levins, MD  estradiol (DOTTI) 0.025 MG/24HR APPLY  1  PATCH TOPICALLY TWICE WEEKLY Patient taking differently: Place 1 patch onto the skin 2 (two) times a week. 01/18/22  Yes Corwin Levins, MD  ferrous sulfate 325 (65 FE) MG tablet Take 1 tablet (325 mg total) by mouth 2 (two) times daily with a meal. Patient taking differently: Take 325 mg by mouth daily with breakfast. 06/26/16  Yes Ladene Artist, MD  HYDROmorphone (DILAUDID) 4 MG tablet Take 2-3 tablets (8-12 mg  total) by mouth every 4 (four) hours as needed for severe pain. 08/04/22  Yes Rana Snare, NP  magnesium oxide (MAG-OX) 400 (240 Mg) MG tablet TAKE 1 TABLET EVERY DAY Patient taking differently: Take 400 mg by mouth daily. 07/03/22  Yes Ladene Artist, MD  morphine (MS CONTIN) 60 MG 12 hr tablet Take 1 tablet (60 mg total) by mouth every 12 (twelve) hours. 05/11/22  Yes Rana Snare, NP  omeprazole (PRILOSEC) 40 MG capsule TAKE 1 CAPSULE EVERY DAY Patient taking differently: Take 40 mg by mouth daily. 04/02/22  Yes Corwin Levins, MD  ondansetron [facility]) 8  MG tablet TAKE 1 TABLET EVERY 8 (EIGHT) HOURS AS NEEDED FOR NAUSEA OR VOMITING. Patient taking differently: Take 8 mg by mouth every 8 (eight) hours as needed for nausea or vomiting. TAKE 1 TABLET EVERY 8 (EIGHT) HOURS AS NEEDED FOR NAUSEA OR VOMITING. 07/03/22  Yes Ladene ArtistSherrill, Gary B, MD  potassium chloride (MICRO-K) 10 MEQ CR capsule TAKE 2 CAPSULES EVERY DAY (SUBSTITUTED FOR  MICRO-K) Patient taking differently: Take 20 mEq by mouth daily. 11/09/21  Yes Ladene ArtistSherrill, Gary B, MD  predniSONE (DELTASONE) 10 MG tablet Take 1 tablet (10 mg total) by mouth daily with breakfast. 06/15/22  Yes Ladene ArtistSherrill, Gary B, MD  Vitamin D, Ergocalciferol, (DRISDOL) 50000 units CAPS capsule Take 1 capsule (50,000 Units total) by mouth every 7 (seven) days. 10/10/16  Yes Corwin LevinsJohn, James W, MD  atorvastatin (LIPITOR) 40 MG tablet TAKE 1 TABLET EVERY DAY Patient not taking: Reported on 08/06/2022 04/02/22   Corwin LevinsJohn, James W, MD  benazepril (LOTENSIN) 40 MG tablet TAKE 1 TABLET EVERY EVENING Patient not taking: Reported on 08/06/2022 04/02/22   Corwin LevinsJohn, James W, MD  diphenoxylate-atropine (LOMOTIL) 2.5-0.025 MG tablet Take 1-2 tablets by mouth 4 times daily as needed for diarrhea or loose stool (max of 8 tablets daily) Patient not taking: Reported on 04/12/2022 12/29/21   Ladene ArtistSherrill, Gary B, MD  loperamide (IMODIUM) 2 MG capsule Take 1-2 capsules (2-4 mg total) by mouth every 8 (eight) hours as  needed for diarrhea or loose stools (Use if >2 BM every 8 hours). Patient not taking: Reported on 04/12/2022 09/05/21   Rana Snarehomas, Lisa K, NP  ripretinib [facility](QINLOCK) 50 MG tablet Take 3 tablets (150 mg total) by mouth daily. Patient not taking: Reported on 08/06/2022 04/07/22   Ladene ArtistSherrill, Gary B, MD  solifenacin (VESICARE) 5 MG tablet TAKE 1 TABLET EVERY DAY Patient not taking: Reported on 04/12/2022 04/02/22   Corwin LevinsJohn, James W, MD   DG CHEST PORT 1 VIEW  Result Date: 08/06/2022 CLINICAL DATA:  77 year old female history of left hip fracture. Preoperative study. EXAM: PORTABLE CHEST 1 VIEW COMPARISON:  Chest x-ray 06/28/2015. FINDINGS: Extensive skin fold artifact projecting over the right hemithorax. Lung volumes are normal. No consolidative airspace disease. No pleural effusions. No pneumothorax. No pulmonary nodule or mass noted. Pulmonary vasculature and the cardiomediastinal silhouette are within normal limits. Atherosclerosis in the thoracic aorta. IMPRESSION: 1.  No radiographic evidence of acute cardiopulmonary disease. 2. Aortic atherosclerosis. Electronically Signed   By: Trudie Reedaniel  Entrikin M.D.   On: 08/06/2022 05:22   DG Tibia/Fibula Left  Result Date: 08/06/2022 CLINICAL DATA:  Status post fall. EXAM: LEFT TIBIA AND FIBULA - 2 VIEW COMPARISON:  None Available. FINDINGS: There is no evidence of fracture or other focal bone lesions. Soft tissues are unremarkable. IMPRESSION: Negative. Electronically Signed   By: Aram Candelahaddeus  Houston M.D.   On: 08/06/2022 02:44   DG Femur Min 2 Views Left  Result Date: 08/06/2022 CLINICAL DATA:  Status post fall. EXAM: LEFT FEMUR 2 VIEWS COMPARISON:  None Available. FINDINGS: An acute, mildly displaced fracture deformity is seen extending through the inter trochanteric region of the proximal left femur. There is no evidence of dislocation. Moderate to marked severity degenerative changes are seen involving the left hip, in the form of joint space narrowing and acetabular  sclerosis. A benign-appearing sclerotic focus is seen within the left iliac bone, just above the level of the left acetabulum. Moderate severity vascular calcification is seen. Soft tissues are otherwise unremarkable. IMPRESSION: Acute, mildly displaced fracture of the proximal  left femur. Electronically Signed   By: [name] M.D.   On: 08/06/2022 02:43   DG Pelvis 1-2 Views  Result Date: 08/06/2022 CLINICAL DATA:  Status post fall. EXAM: PELVIS - 1-2 VIEW COMPARISON:  None Available. FINDINGS: A total right hip replacement is seen. There is no evidence of surrounding lucency to suggest the presence of hardware loosening or infection. An acute fracture deformity is seen extending through the inter trochanteric region of the proximal left femur. There is no evidence of dislocation. Moderate to marked severity degenerative changes are seen involving the left hip, in the form of joint space narrowing and acetabular sclerosis. IMPRESSION: 1. Acute fracture of the proximal left femur. 2. Intact total right hip replacement. Electronically Signed   By: [name] M.D.   On: 08/06/2022 02:39    Positive ROS: All other systems have been reviewed and were otherwise negative with the exception of those mentioned in the HPI and as above.  Objective: Labs cbc Recent Labs    08/06/22 0147 08/06/22 0437  WBC 8.6 5.8  HGB 8.0* 6.5*  HCT 27.4* 22.4*  PLT 312 234    Labs inflam No results for input(s): "CRP" in the last 72 hours.  Invalid input(s): "ESR"  Labs coag No results for input(s): "INR", "PTT" in the last 72 hours.  Invalid input(s): "PT"  Recent Labs    08/06/22 0147 08/06/22 0437  NA 142 142  K 4.6 4.4  CL 110 116*  CO2 20* 20*  GLUCOSE 99 84  BUN 14 14  CREATININE 0.98 0.91  CALCIUM 8.6* 7.8*    Physical Exam: Vitals:   08/06/22 0730 08/06/22 0740  BP: 105/62   Pulse: 90 96  Resp: 15 18  Temp:    SpO2: 100% 100%   General: Alert, no acute distress.   Laying flat in bed, calm Mental status: Alert and Oriented x3 Neurologic: Speech Clear and organized, no gross focal findings or movement disorder appreciated. Respiratory: No cyanosis, no use of accessory musculature Cardiovascular: No pedal edema GI: Abdomen is soft and non-tender, non-distended. Skin: Warm and dry. Multiple lesions on left leg below the knee from shingles. Covered with Mepilex bandages. No lesions in the area of chief complaint. Extremities: Warm and well perfused w/o edema Psychiatric: Patient is competent for consent with normal mood and affect  MUSCULOSKELETAL:  TTP left hip, limited ROM d/t pain, NVI Other extremities are atraumatic with painless ROM and NVI.  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 Keep NPO.   Weightbearing: NWB LLE Orthopedic device(s): None VTE prophylaxis:  on hold for surgery   Pain control: PRN Follow - up plan: 2 weeks postop Contact information:  Margarita Rana MD, [facility] PA-C  Jenne Pane PA-C Office [phone] 08/06/2022 7:59 AM

## 2022-08-06 NOTE — ED Notes (Signed)
[phone] [name] (husband)

## 2022-08-06 NOTE — ED Notes (Signed)
ED TO INPATIENT HANDOFF REPORT  ED Nurse Name and Phone #: Delice Bison, RN  S Name/Age/Gender Brianna Walker Place 77 y.o. female Room/Bed: 038C/038C  Code Status   Code Status: DNR  Home/SNF/Other Home Patient oriented to: self, place, time, and situation Is this baseline? Yes   Triage Complete: Triage complete  Chief Complaint Closed left hip fracture, initial encounter [S72.002A]  Triage Note Pt on hospice care. Hx of stomach and bowel cancer. No cancer treatments at this time. Fell at 2100 on 08/05/22 at home. Left femur pain. Pt took 4 mg of Dilaudid PO.  No DNR form via EMS but patient claims she is.     Vitals en route  104/68 96% RA 120 HR 18 RR   Allergies No Known Allergies  Level of Care/Admitting Diagnosis ED Disposition     ED Disposition  Admit   Condition  --   Comment  Hospital Area: [facility] [[id number]]  Level of Care: Telemetry Medical [104]  May admit patient to [facility] or [facility] if equivalent level of care is available:: No  Covid Evaluation: Asymptomatic - no recent exposure (last 10 days) testing not required  Diagnosis: Closed left hip fracture, initial encounter [161096]  Admitting Physician: Briscoe Deutscher [[id number]]  Attending Physician: Briscoe Deutscher [[id number]]  Certification:: I certify this patient will need inpatient services for at least 2 midnights  Estimated Length of Stay: 4          B Medical/Surgery History Past Medical History:  Diagnosis Date   AKI (acute kidney injury) 06/21/2015   ALLERGIC RHINITIS 09/12/2007   Arthritis    BACK PAIN 06/19/2008   Cramp of limb 08/11/2009   DEEP VENOUS THROMBOPHLEBITIS, LEG, RIGHT 03/04/2010   right leg   Diverticulosis    FREQUENCY, URINARY 10/07/2009   GERD 12/05/2006   gist    dx. 4'16 -oral chemotherapy only.   HEMORRHOIDS 11/17/2006   HIP PAIN, RIGHT, CHRONIC 08/11/2009   HX, PERSONAL, TUBERCULOSIS 11/17/2006   HYPERLIPIDEMIA 09/12/2007   Hyperlipidemia  09/12/2007   Qualifier: Diagnosis of  By: Jonny Ruiz MD, Len Blalock    HYPERTENSION 11/17/2006   INSOMNIA-SLEEP DISORDER-UNSPEC 06/19/2008   Iron deficiency anemia 12/07/2011   LEG PAIN, RIGHT 06/19/2008   Long term (current) use of anticoagulants 11/29/2010   MASS, SUPERFICIAL 09/12/2007   MENOPAUSAL DISORDER 09/20/2009   OVERACTIVE BLADDER 03/04/2010   Perforation of colon 11/17/2015   Pneumonia    PULMONARY EMBOLISM, HX OF 11/17/2006   [place] [facility] s/p Pneumonia   RECTAL BLEEDING 10/07/2007   Rectal mass 06/2014   SCIATICA, RIGHT 09/12/2007   Shortness of breath dyspnea    With exertion since chemotherapy tx-oral meds   SKIN LESION 07/20/2009   TMJ SYNDROME 11/17/2006   Tuberculosis    pt. was tx.   Tubular adenoma of colon 2016   Past Surgical History:  Procedure Laterality Date   ABDOMINAL HYSTERECTOMY     CHOLECYSTECTOMY OPEN     COLONOSCOPY     EUS N/A 08/06/2014   Procedure: LOWER ENDOSCOPIC ULTRASOUND (EUS);  Surgeon: Rachael Fee, MD;  Location: [facility];  Service: Endoscopy;  Laterality: N/A;   EXCISION MASS ABDOMINAL N/A 01/28/2016   Procedure: EXCISION  ABDOMINAL WALL MASS, CYST, QUESTION FAT NECROSIS, QUESTION SEROMA;  Surgeon: Karie Soda, MD;  Location: [facility];  Service: General;  Laterality: N/A;   hemorroid surgery  removed at dr Anselm Jungling office   x 2   ILEOSTOMY CLOSURE N/A  01/28/2016   Procedure: TAKEDOWN OF LOOP ILEOSTOMY;  Surgeon: Karie Soda, MD;  Location: [facility];  Service: General;  Laterality: N/A;   INCISIONAL HERNIA REPAIR  2002     multiple ventral hernia repair/mesh   INCISIONAL HERNIA REPAIR  01/13/2005   open w onlay Proceed mesh.   LAPAROSCOPIC LYSIS OF ADHESIONS  06/04/2015   Procedure: LAPAROSCOPIC LYSIS OF ADHESIONS;  Surgeon: Karie Soda, MD;  Location: [facility];  Service: General;;   PROCTOSCOPY N/A 01/28/2016   Procedure: RIGID PROCTOSCOPY, DILATION OF COLORECTAL ANASTOMOTIC STRICTURE;  Surgeon: Karie Soda, MD;  Location: [facility];  Service:  General;  Laterality: N/A;   s/p right hip replaced min invasive hip surgury [facility] oct 2011 Right 2011   tumor removed off of right shoulder Right    XI ROBOTIC ASSISTED LOWER ANTERIOR RESECTION N/A 06/04/2015   Procedure: XI ROBOTIC ASSISTED LYSIS OF ADHESIONS, LOWER ANTERIOR RESECTION TRANS ABDOMINAL AND TRANS ANAL, COLOANAL HANDSEWN ANASTAOSIS, DIVERTING LOPE ILIOSTOMY, RESECTION GIST TUMOR;  Surgeon: Karie Soda, MD;  Location: [facility];  Service: General;  Laterality: N/A;     A IV Location/Drains/Wounds Patient Lines/Drains/Airways Status     Active Line/Drains/Airways     Name Placement date Placement time Site Days   Peripheral IV 08/06/22 20 G Right Antecubital 08/06/22  0225  Antecubital  less than 1   Closed System Drain 1 Right Abdomen Bulb (JP) 19 Fr. 01/28/16  1315  Abdomen  2382   Closed System Drain 2 Left Abdomen Bulb (JP) 19 Fr. 01/28/16  1315  Abdomen  2382   Negative Pressure Wound Therapy Abdomen Other (Comment) 01/28/16  1428  --  2382   Ileostomy Loop RUQ 06/04/15  --  RUQ  2620   Incision (Closed) 01/28/16 Abdomen 01/28/16  1352  -- 2382            Intake/Output Last 24 hours No intake or output data in the 24 hours ending 08/06/22 0758  Labs/Imaging Results for orders placed or performed during the hospital encounter of 08/06/22 (from the past 48 hour(s))  CBC     Status: Abnormal   Collection Time: 08/06/22  1:47 AM  Result Value Ref Range   WBC 8.6 4.0 - 10.5 K/uL   RBC 3.30 (L) 3.87 - 5.11 MIL/uL   Hemoglobin 8.0 (L) 12.0 - 15.0 g/dL   HCT 16.1 (L) 09.6 - 04.5 %   MCV 83.0 80.0 - 100.0 fL   MCH 24.2 (L) 26.0 - 34.0 pg   MCHC 29.2 (L) 30.0 - 36.0 g/dL   RDW 40.9 (H) 81.1 - 91.4 %   Platelets 312 150 - 400 K/uL   nRBC 0.0 0.0 - 0.2 %    Comment: Performed at [facility], [address], [place], [place] [address]  Comprehensive metabolic panel     Status: Abnormal   Collection Time: 08/06/22  1:47 AM  Result Value Ref Range   Sodium  142 135 - 145 mmol/L   Potassium 4.6 3.5 - 5.1 mmol/L   Chloride 110 98 - 111 mmol/L   CO2 20 (L) 22 - 32 mmol/L   Glucose, Bld 99 70 - 99 mg/dL    Comment: Glucose reference range applies only to samples taken after fasting for at least 8 hours.   BUN 14 8 - 23 mg/dL   Creatinine, Ser 6.21 0.44 - 1.00 mg/dL   Calcium 8.6 (L) 8.9 - 10.3 mg/dL   Total Protein 5.5 (L) 6.5 - 8.1  g/dL   Albumin 2.2 (L) 3.5 - 5.0 g/dL   AST 28 15 - 41 U/L   ALT 16 0 - 44 U/L   Alkaline Phosphatase 77 38 - 126 U/L   Total Bilirubin 0.7 0.3 - 1.2 mg/dL   GFR, Estimated >09>60 >81>60 mL/min    Comment: (NOTE) Calculated using the CKD-EPI Creatinine Equation (2021)    Anion gap 12 5 - 15    Comment: Performed at [facility], [address] [place], [place] [address]  Type and screen [facility]     Status: None (Preliminary result)   Collection Time: 08/06/22  4:05 AM  Result Value Ref Range   ABO/RH(D) O POS    Antibody Screen NEG    Sample Expiration 08/09/2022,2359    Unit Number [id number]    Blood Component Type RBC LR PHER2    Unit division 00    Status of Unit ISSUED    Transfusion Status OK TO TRANSFUSE    Crossmatch Result      Compatible Performed at [facility], [address] [place], [place] [address]   CBC     Status: Abnormal   Collection Time: 08/06/22  4:37 AM  Result Value Ref Range   WBC 5.8 4.0 - 10.5 K/uL   RBC 2.68 (L) 3.87 - 5.11 MIL/uL   Hemoglobin 6.5 (LL) 12.0 - 15.0 g/dL    Comment: REPEATED TO VERIFY THIS CRITICAL RESULT HAS VERIFIED AND BEEN CALLED TO R. TERRY, RN BY HAYLEE HOWARD ON 04 07 2024 AT 0453, AND HAS BEEN READ BACK.     HCT 22.4 (L) 36.0 - 46.0 %   MCV 83.6 80.0 - 100.0 fL   MCH 24.3 (L) 26.0 - 34.0 pg   MCHC 29.0 (L) 30.0 - 36.0 g/dL   RDW 29.519.9 (H) 28.411.5 - 13.215.5 %   Platelets 234 150 - 400 K/uL   nRBC 0.0 0.0 - 0.2 %    Comment: Performed at [facility], [address] [place], [place] [address]  Basic metabolic  panel     Status: Abnormal   Collection Time: 08/06/22  4:37 AM  Result Value Ref Range   Sodium 142 135 - 145 mmol/L   Potassium 4.4 3.5 - 5.1 mmol/L   Chloride 116 (H) 98 - 111 mmol/L   CO2 20 (L) 22 - 32 mmol/L   Glucose, Bld 84 70 - 99 mg/dL    Comment: Glucose reference range applies only to samples taken after fasting for at least 8 hours.   BUN 14 8 - 23 mg/dL   Creatinine, Ser 2.720.91 0.44 - 1.00 mg/dL   Calcium 7.8 (L) 8.9 - 10.3 mg/dL   GFR, Estimated >53>60 >66>60 mL/min    Comment: (NOTE) Calculated using the CKD-EPI Creatinine Equation (2021)    Anion gap 6 5 - 15    Comment: Performed at [facility], [address] [place], [place] [address]  Prepare RBC (crossmatch)     Status: None   Collection Time: 08/06/22  5:17 AM  Result Value Ref Range   Order Confirmation      ORDER PROCESSED BY BLOOD BANK Performed at [facility], [address] [place], [place] [address]    *Note: Due to a large number of results and/or encounters for the requested time period, some results have not been displayed. A complete set of results can be found in Results Review.   DG  CHEST PORT 1 VIEW  Result Date: 08/06/2022 CLINICAL DATA:  77 year old female history of left hip fracture. Preoperative study. EXAM: PORTABLE CHEST 1 VIEW COMPARISON:  Chest x-ray 06/28/2015. FINDINGS: Extensive skin fold artifact projecting over the right hemithorax. Lung volumes are normal. No consolidative airspace disease. No pleural effusions. No pneumothorax. No pulmonary nodule or mass noted. Pulmonary vasculature and the cardiomediastinal silhouette are within normal limits. Atherosclerosis in the thoracic aorta. IMPRESSION: 1.  No radiographic evidence of acute cardiopulmonary disease. 2. Aortic atherosclerosis. Electronically Signed   By: [name] M.D.   On: 08/06/2022 05:22   DG Tibia/Fibula Left  Result Date: 08/06/2022 CLINICAL DATA:  Status post fall. EXAM: LEFT TIBIA AND FIBULA - 2 VIEW  COMPARISON:  None Available. FINDINGS: There is no evidence of fracture or other focal bone lesions. Soft tissues are unremarkable. IMPRESSION: Negative. Electronically Signed   By: [name] M.D.   On: 08/06/2022 02:44   DG Femur Min 2 Views Left  Result Date: 08/06/2022 CLINICAL DATA:  Status post fall. EXAM: LEFT FEMUR 2 VIEWS COMPARISON:  None Available. FINDINGS: An acute, mildly displaced fracture deformity is seen extending through the inter trochanteric region of the proximal left femur. There is no evidence of dislocation. Moderate to marked severity degenerative changes are seen involving the left hip, in the form of joint space narrowing and acetabular sclerosis. A benign-appearing sclerotic focus is seen within the left iliac bone, just above the level of the left acetabulum. Moderate severity vascular calcification is seen. Soft tissues are otherwise unremarkable. IMPRESSION: Acute, mildly displaced fracture of the proximal left femur. Electronically Signed   By: [name] M.D.   On: 08/06/2022 02:43   DG Pelvis 1-2 Views  Result Date: 08/06/2022 CLINICAL DATA:  Status post fall. EXAM: PELVIS - 1-2 VIEW COMPARISON:  None Available. FINDINGS: A total right hip replacement is seen. There is no evidence of surrounding lucency to suggest the presence of hardware loosening or infection. An acute fracture deformity is seen extending through the inter trochanteric region of the proximal left femur. There is no evidence of dislocation. Moderate to marked severity degenerative changes are seen involving the left hip, in the form of joint space narrowing and acetabular sclerosis. IMPRESSION: 1. Acute fracture of the proximal left femur. 2. Intact total right hip replacement. Electronically Signed   By: [name] M.D.   On: 08/06/2022 02:39    Pending Labs Unresulted Labs (From admission, onward)     Start     Ordered   08/06/22 0500  CBC  Daily,   R      08/06/22 0401    08/06/22 0500  Basic metabolic panel  Daily,   R      08/06/22 0401            Vitals/Pain Today's Vitals   08/06/22 0544 08/06/22 0611 08/06/22 0615 08/06/22 0628  BP:  110/67 110/67 101/65  Pulse:  (!) 110 (!) 114 (!) 104  Resp:  14 19 19   Temp:  98.4 F (36.9 C)  98.6 F (37 C)  TempSrc:  Oral  Oral  SpO2:   100% 100%  Weight:      Height:      PainSc: Asleep       Isolation Precautions No active isolations  Medications Medications  morphine (MS CONTIN) 12 hr tablet 60 mg (has no administration in time range)  atorvastatin (LIPITOR) tablet 40 mg (has no administration in time range)  predniSONE (DELTASONE)  tablet 10 mg (10 mg Oral Given 08/06/22 0719)  pantoprazole (PROTONIX) EC tablet 40 mg (has no administration in time range)  0.9 %  sodium chloride infusion ( Intravenous New Bag/Given 08/06/22 0431)  acetaminophen (TYLENOL) tablet 650 mg (has no administration in time range)  oxyCODONE (Oxy IR/ROXICODONE) immediate release tablet 5-10 mg (has no administration in time range)  HYDROmorphone (DILAUDID) injection 1 mg (1 mg Intravenous Given 08/06/22 0720)  polyethylene glycol (MIRALAX / GLYCOLAX) packet 17 g (has no administration in time range)  methocarbamol (ROBAXIN) tablet 500 mg (has no administration in time range)    Or  methocarbamol (ROBAXIN) 500 mg in dextrose 5 % 50 mL IVPB (has no administration in time range)  ondansetron (ZOFRAN) injection 4 mg (has no administration in time range)  HYDROmorphone (DILAUDID) injection 1 mg (1 mg Intravenous Given 08/06/22 0148)  HYDROmorphone (DILAUDID) injection 1 mg (1 mg Intravenous Given 08/06/22 0251)  sodium chloride 0.9 % bolus 500 mL (0 mLs Intravenous Stopped 08/06/22 0519)  0.9 %  sodium chloride infusion (Manually program via Guardrails IV Fluids) ( Intravenous New Bag/Given 08/06/22 0611)    Mobility walks     Focused Assessments Cardiac Assessment Handoff:    No results found for: "CKTOTAL", "CKMB",  "CKMBINDEX", "TROPONINI" No results found for: "DDIMER" Does the Patient currently have chest pain? No    R Recommendations: See Admitting Provider Note  Report given to:   Additional Notes:

## 2022-08-06 NOTE — Anesthesia Preprocedure Evaluation (Addendum)
Anesthesia Evaluation  Patient identified by MRN, date of birth, ID band Patient awake    Reviewed: Allergy & Precautions, NPO status , Patient's Chart, lab work & pertinent test results  Airway Mallampati: II  TM Distance: >3 FB Neck ROM: Full    Dental no notable dental hx.    Pulmonary former smoker   Pulmonary exam normal        Cardiovascular hypertension, Pt. on medications + CAD   Rhythm:Regular Rate:Normal     Neuro/Psych negative neurological ROS  negative psych ROS   GI/Hepatic Neg liver ROS,GERD  Medicated,,  Endo/Other  negative endocrine ROS    Renal/GU   negative genitourinary   Musculoskeletal  (+) Arthritis , Osteoarthritis,  Left hip fracture   Abdominal Normal abdominal exam  (+)   Peds  Hematology  (+) Blood dyscrasia, anemia   Anesthesia Other Findings   Reproductive/Obstetrics                             Anesthesia Physical Anesthesia Plan  ASA: 2  Anesthesia Plan: General   Post-op Pain Management:    Induction: Intravenous  PONV Risk Score and Plan: 3 and Ondansetron, Dexamethasone and Treatment may vary due to age or medical condition  Airway Management Planned: Mask and Oral ETT  Additional Equipment: None  Intra-op Plan:   Post-operative Plan: Extubation in OR  Informed Consent: I have reviewed the patients History and Physical, chart, labs and discussed the procedure including the risks, benefits and alternatives for the proposed anesthesia with the patient or authorized representative who has indicated his/her understanding and acceptance.     Dental advisory given  Plan Discussed with: CRNA  Anesthesia Plan Comments:        Anesthesia Quick Evaluation

## 2022-08-06 NOTE — Plan of Care (Signed)
  Problem: Education: Goal: Knowledge of General Education information will improve Description: Including pain rating scale, medication(s)/side effects and non-pharmacologic comfort measures Outcome: Progressing   Problem: Coping: Goal: Level of anxiety will decrease Outcome: Progressing   Problem: Pain Managment: Goal: General experience of comfort will improve Outcome: Progressing   

## 2022-08-06 NOTE — Interval H&P Note (Signed)
History and Physical Interval Note:  08/06/2022 1:10 PM  Brianna Walker  has presented today for surgery, with the diagnosis of LEFT HIP FRACTURE.  The various methods of treatment have been discussed with the patient and family. After consideration of risks, benefits and other options for treatment, the patient has consented to  Procedure(s): INTRAMEDULLARY (IM) NAIL HIP (Left) as a surgical intervention.  The patient's history has been reviewed, patient examined, no change in status, stable for surgery.  I have reviewed the patient's chart and labs.  Questions were answered to the patient's satisfaction.     Sheral Apley

## 2022-08-06 NOTE — Progress Notes (Signed)
[facility]) hospitalized hospice patient visit  Brianna Walker is a current [facility] hospice patient with a terminal diagnosis of Rectal Cancer. She had a fall at home and was transported to the ED for evaluation. She was admitted to the hospital on 4.7.24 with a diagnosis of femur fracture and underwent IM nailing. Per Dr. Patric Dykes with [facility] this is a related hospital admission.   [name] underwent IM nailing this afternoon. Exchanged report with MD/bedside nurse. She is inpatient appropriate due to need for surgical intervention/IV medications and skilled after care.   Vital Signs- 98.5/109/18   107/65    100% on room air Intake/Output- not yet recorded Abnormal labs- Chloride 116, CO2 20, Ca+ 7.8, Albumin 2.2, RBC 2.68, Hgb 6.5, Hct 22.4 Diagnostics-   LEFT FEMUR 2 VIEWS  COMPARISON:  None Available. FINDINGS: An acute, mildly displaced fracture deformity is seen extending through the inter trochanteric region of the proximal left femur. There is no evidence of dislocation. Moderate to marked severity degenerative changes are seen involving the left hip, in the form of joint space narrowing and acetabular sclerosis. A benign-appearing sclerotic focus is seen within the left iliac bone, just above the level of the left acetabulum. Moderate severity vascular calcification is seen. Soft tissues are otherwise unremarkable. IMPRESSION: Acute, mildly displaced fracture of the proximal left femur. Electronically Signed   By: [name] M.D.   On: 08/06/2022 02:43  IV/PRN Meds- Dilaudid 1mg  IVx2, NS @75cc /H, Ancef 2g IV x1, Robaxin 500mg  IV x1, Zofran 4mg  IV x2  Problem List-  Left hip fracture  - Based on the available data, [name] presents an estimated 0.73% risk of perioperative MI or cardiac arrest  - Hold Eliquis (last dose was PM of 4/6), hold benazepril, continue pain-control, type & screen, transfuse RBC preoperatively      2. 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 Hypertension  - Hold benazepril perioperatively - Treat as-needed only for now     4. Anemia  - Hgb 6.5 this am, down from 8.0 on presentation to ED    - No overt bleeding  - Type and screen, transfuse 1 unit RBC and then repeat CBC    5. Hx of DVT/PE  - Remote RLE DVT and PE  - She last took Eliquis the night of 4/6, will hold preoperatively        Discharge Planning- Ongoing, likely back to home once stable Family Contact- None, unable to reach husband IDT- Updated Goals of care - DNR Please utilize [facility] for discharge as we contract with them for our patients.  [name], Charity fundraiser, [facility] Liaison [phone]

## 2022-08-06 NOTE — Anesthesia Procedure Notes (Signed)
Procedure Name: Intubation Date/Time: 08/06/2022 1:57 PM  Performed by: Dairl Ponder, CRNAPre-anesthesia Checklist: Patient identified, Emergency Drugs available, Suction available and Patient being monitored Patient Re-evaluated:Patient Re-evaluated prior to induction Oxygen Delivery Method: Circle System Utilized Preoxygenation: Pre-oxygenation with 100% oxygen Induction Type: IV induction Ventilation: Mask ventilation without difficulty Laryngoscope Size: Mac and 3 Grade View: Grade I Tube type: Oral Tube size: 7.0 mm Number of attempts: 1 Airway Equipment and Method: Stylet and Oral airway Placement Confirmation: ETT inserted through vocal cords under direct vision, positive ETCO2 and breath sounds checked- equal and bilateral Secured at: 21 cm Tube secured with: Tape Dental Injury: Teeth and Oropharynx as per pre-operative assessment

## 2022-08-06 NOTE — Anesthesia Postprocedure Evaluation (Signed)
Anesthesia Post Note  Patient: Brianna Walker  Procedure(s) Performed: INTRAMEDULLARY (IM) NAIL HIP (Left: Hip)     Patient location during evaluation: PACU Anesthesia Type: General Level of consciousness: awake and alert Pain management: pain level controlled Vital Signs Assessment: post-procedure vital signs reviewed and stable Respiratory status: spontaneous breathing, nonlabored ventilation, respiratory function stable and patient connected to nasal cannula oxygen Cardiovascular status: blood pressure returned to baseline and stable Postop Assessment: no apparent nausea or vomiting Anesthetic complications: no   No notable events documented.  Last Vitals:  Vitals:   08/06/22 1548 08/06/22 1550  BP:  115/68  Pulse:  86  Resp:  18  Temp:  36.7 C  SpO2: 100% 100%    Last Pain:  Vitals:   08/06/22 1611  TempSrc:   PainSc: 0-No pain                 Earl Lites P Dekari Bures

## 2022-08-06 NOTE — Hospital Course (Signed)
Brianna Walker is a 77 y.o. F with metastatic GIST, HTN, and VTE on Eliquis who presented with leg pain after a fall.   4/7: Admitted with hip fracture, taken to the OR

## 2022-08-06 NOTE — H&P (Signed)
History and Physical    Letonya Mangels [facility] WUJ:[id number] DOB: 22-Jan-1946 DOA: 08/06/2022  PCP: Corwin Levins, MD   Patient coming from: Home   Chief Complaint: Fall, left leg pain   HPI: Reyanne Hussar Hefley is a pleasant 77 y.o. female with medical history significant for hypertension, GIST of the rectum, chronic pain, and history of VTE on Eliquis who presents to the emergency department with left leg pain after a fall at home.  Patient was in her usual state of health when she suffered a mechanical fall at home, landing on her left side and experiencing immediate and severe pain in the proximal left leg.  She denies hitting her head or losing consciousness.  She denies any recent chest pain, shortness of breath, fever, or chills.  She remains fairly active and is able to ascend at least 1 flight of stairs without any chest pain.  She took her p.m. dose of Eliquis last night.  ED Course: Upon arrival to the ED, patient is found to be afebrile and saturating well on room air with elevated heart rate and stable blood pressure.  EKG demonstrates sinus tachycardia with rate 123.  Plain radiographs of the hip and pelvis demonstrate left intertrochanteric femur fracture.  Labs are notable for hemoglobin 8.0 and albumin 2.2.  Orthopedic surgery (Dr. [name]) was consulted by the ED physician and the patient was treated with 2 doses of Dilaudid in the ED.  Review of Systems:  All other systems reviewed and apart from HPI, are negative.  Past Medical History:  Diagnosis Date   AKI (acute kidney injury) 06/21/2015   ALLERGIC RHINITIS 09/12/2007   Arthritis    BACK PAIN 06/19/2008   Cramp of limb 08/11/2009   DEEP VENOUS THROMBOPHLEBITIS, LEG, RIGHT 03/04/2010   right leg   Diverticulosis    FREQUENCY, URINARY 10/07/2009   GERD 12/05/2006   gist    dx. 4'16 -oral chemotherapy only.   HEMORRHOIDS 11/17/2006   HIP PAIN, RIGHT, CHRONIC 08/11/2009   HX, PERSONAL, TUBERCULOSIS 11/17/2006    HYPERLIPIDEMIA 09/12/2007   Hyperlipidemia 09/12/2007   Qualifier: Diagnosis of  By: Jonny Ruiz MD, Len Blalock    HYPERTENSION 11/17/2006   INSOMNIA-SLEEP DISORDER-UNSPEC 06/19/2008   Iron deficiency anemia 12/07/2011   LEG PAIN, RIGHT 06/19/2008   Long term (current) use of anticoagulants 11/29/2010   MASS, SUPERFICIAL 09/12/2007   MENOPAUSAL DISORDER 09/20/2009   OVERACTIVE BLADDER 03/04/2010   Perforation of colon 11/17/2015   Pneumonia    PULMONARY EMBOLISM, HX OF 11/17/2006   [place] [facility] s/p Pneumonia   RECTAL BLEEDING 10/07/2007   Rectal mass 06/2014   SCIATICA, RIGHT 09/12/2007   Shortness of breath dyspnea    With exertion since chemotherapy tx-oral meds   SKIN LESION 07/20/2009   TMJ SYNDROME 11/17/2006   Tuberculosis    pt. was tx.   Tubular adenoma of colon 2016    Past Surgical History:  Procedure Laterality Date   ABDOMINAL HYSTERECTOMY     CHOLECYSTECTOMY OPEN     COLONOSCOPY     EUS N/A 08/06/2014   Procedure: LOWER ENDOSCOPIC ULTRASOUND (EUS);  Surgeon: Rachael Fee, MD;  Location: [facility];  Service: Endoscopy;  Laterality: N/A;   EXCISION MASS ABDOMINAL N/A 01/28/2016   Procedure: EXCISION  ABDOMINAL WALL MASS, CYST, QUESTION FAT NECROSIS, QUESTION SEROMA;  Surgeon: Karie Soda, MD;  Location: [facility];  Service: General;  Laterality: N/A;   hemorroid surgery  removed at dr Anselm Jungling office   x  2   ILEOSTOMY CLOSURE N/A 01/28/2016   Procedure: TAKEDOWN OF LOOP ILEOSTOMY;  Surgeon: Karie Soda, MD;  Location: [facility];  Service: General;  Laterality: N/A;   INCISIONAL HERNIA REPAIR  2002     multiple ventral hernia repair/mesh   INCISIONAL HERNIA REPAIR  01/13/2005   open w onlay Proceed mesh.   LAPAROSCOPIC LYSIS OF ADHESIONS  06/04/2015   Procedure: LAPAROSCOPIC LYSIS OF ADHESIONS;  Surgeon: Karie Soda, MD;  Location: [facility];  Service: General;;   PROCTOSCOPY N/A 01/28/2016   Procedure: RIGID PROCTOSCOPY, DILATION OF COLORECTAL ANASTOMOTIC STRICTURE;  Surgeon:  Karie Soda, MD;  Location: [facility];  Service: General;  Laterality: N/A;   s/p right hip replaced min invasive hip surgury [facility] oct 2011 Right 2011   tumor removed off of right shoulder Right    XI ROBOTIC ASSISTED LOWER ANTERIOR RESECTION N/A 06/04/2015   Procedure: XI ROBOTIC ASSISTED LYSIS OF ADHESIONS, LOWER ANTERIOR RESECTION TRANS ABDOMINAL AND TRANS ANAL, COLOANAL HANDSEWN ANASTAOSIS, DIVERTING LOPE ILIOSTOMY, RESECTION GIST TUMOR;  Surgeon: Karie Soda, MD;  Location: [facility];  Service: General;  Laterality: N/A;    Social History:   reports that she quit smoking about 45 years ago. Her smoking use included cigarettes. She has never used smokeless tobacco. She reports current alcohol use. She reports that she does not use drugs.  No Known Allergies  Family History  Problem Relation Age of Onset   Colon cancer Father    Heart disease Mother    Heart disease Sister    Diabetes Sister    Esophageal cancer Neg Hx    Stomach cancer Neg Hx    Rectal cancer Neg Hx      Prior to Admission medications   Medication Sig Start Date End Date Taking? Authorizing Provider  apixaban (ELIQUIS) 5 MG TABS tablet TAKE 1 TABLET TWICE DAILY 04/02/22   Corwin Levins, MD  atorvastatin (LIPITOR) 40 MG tablet TAKE 1 TABLET EVERY DAY 04/02/22   Corwin Levins, MD  benazepril (LOTENSIN) 40 MG tablet TAKE 1 TABLET EVERY EVENING 04/02/22   Corwin Levins, MD  diphenoxylate-atropine (LOMOTIL) 2.5-0.025 MG tablet Take 1-2 tablets by mouth 4 times daily as needed for diarrhea or loose stool (max of 8 tablets daily) Patient not taking: Reported on 04/12/2022 12/29/21   Ladene Artist, MD  estradiol (DOTTI) 0.025 MG/24HR APPLY  1  PATCH TOPICALLY TWICE WEEKLY 01/18/22   Corwin Levins, MD  ferrous sulfate 325 (65 FE) MG tablet Take 1 tablet (325 mg total) by mouth 2 (two) times daily with a meal. 06/26/16   Ladene Artist, MD  HYDROmorphone (DILAUDID) 4 MG tablet Take 2-3 tablets (8-12 mg total) by mouth every  4 (four) hours as needed for severe pain. 08/04/22   Rana Snare, NP  loperamide (IMODIUM) 2 MG capsule Take 1-2 capsules (2-4 mg total) by mouth every 8 (eight) hours as needed for diarrhea or loose stools (Use if >2 BM every 8 hours). Patient not taking: Reported on 04/12/2022 09/05/21   Rana Snare, NP  magnesium oxide (MAG-OX) 400 (240 Mg) MG tablet TAKE 1 TABLET EVERY DAY 07/03/22   Ladene Artist, MD  morphine (MS CONTIN) 60 MG 12 hr tablet Take 1 tablet (60 mg total) by mouth every 12 (twelve) hours. 05/11/22   Rana Snare, NP  nystatin (MYCOSTATIN/NYSTOP) powder APPLY TOPICALLY THREE TIMES DAILY AS NEEDED Patient not taking: Reported on 12/29/2021 09/06/20   Thornton Papas  B, MD  omeprazole (PRILOSEC) 40 MG capsule TAKE 1 CAPSULE EVERY DAY 04/02/22   Corwin Levins, MD  ondansetron (ZOFRAN) 8 MG tablet TAKE 1 TABLET EVERY 8 (EIGHT) HOURS AS NEEDED FOR NAUSEA OR VOMITING. 07/03/22   Ladene Artist, MD  potassium chloride (MICRO-K) 10 MEQ CR capsule TAKE 2 CAPSULES EVERY DAY (SUBSTITUTED FOR  MICRO-K) 11/09/21   Ladene Artist, MD  predniSONE (DELTASONE) 10 MG tablet Take 1 tablet (10 mg total) by mouth daily with breakfast. 06/15/22   Ladene Artist, MD  ripretinib (QINLOCK) 50 MG tablet Take 3 tablets (150 mg total) by mouth daily. 04/07/22   Ladene Artist, MD  solifenacin (VESICARE) 5 MG tablet TAKE 1 TABLET EVERY DAY Patient not taking: Reported on 04/12/2022 04/02/22   Corwin Levins, MD  Vitamin D, Ergocalciferol, (DRISDOL) 50000 units CAPS capsule Take 1 capsule (50,000 Units total) by mouth every 7 (seven) days. 10/10/16   Corwin Levins, MD    Physical Exam: Vitals:   08/06/22 0133 08/06/22 0134 08/06/22 0245 08/06/22 0330  BP: 111/75  111/80 116/73  Pulse: (!) 126  (!) 146 (!) 140  Resp: 18  20 20   Temp: 97.8 F (36.6 C)     TempSrc: Oral     SpO2: 100%  100% 100%  Weight:  42.1 kg    Height:  5\' 4"  (1.626 m)      Constitutional: NAD, calm, frail-appearing  Eyes:  PERTLA, lids and conjunctivae normal ENMT: Mucous membranes are moist. Posterior pharynx clear of any exudate or lesions.   Neck: supple, no masses  Respiratory: no wheezing, no crackles. No accessory muscle use.  Cardiovascular: S1 & S2 heard, regular rate and rhythm. No extremity edema.   Abdomen: No distension, no tenderness, soft. Bowel sounds active.  Musculoskeletal: no clubbing / cyanosis. Left hip tender, neurovascularly intact distally.   Skin: no significant rashes, lesions, ulcers. Warm, dry, well-perfused. Neurologic: CN 2-12 grossly intact. Moving all extremities. Alert and oriented.  Psychiatric: Pleasant. Cooperative.    Labs and Imaging on Admission: I have personally reviewed following labs and imaging studies  CBC: Recent Labs  Lab 08/06/22 0147  WBC 8.6  HGB 8.0*  HCT 27.4*  MCV 83.0  PLT 312   Basic Metabolic Panel: Recent Labs  Lab 08/06/22 0147  NA 142  K 4.6  CL 110  CO2 20*  GLUCOSE 99  BUN 14  CREATININE 0.98  CALCIUM 8.6*   GFR: Estimated Creatinine Clearance: 33.5 mL/min (by C-G formula based on SCr of 0.98 mg/dL). Liver Function Tests: Recent Labs  Lab 08/06/22 0147  AST 28  ALT 16  ALKPHOS 77  BILITOT 0.7  PROT 5.5*  ALBUMIN 2.2*   No results for input(s): "LIPASE", "AMYLASE" in the last 168 hours. No results for input(s): "AMMONIA" in the last 168 hours. Coagulation Profile: No results for input(s): "INR", "PROTIME" in the last 168 hours. Cardiac Enzymes: No results for input(s): "CKTOTAL", "CKMB", "CKMBINDEX", "TROPONINI" in the last 168 hours. BNP (last 3 results) No results for input(s): "PROBNP" in the last 8760 hours. HbA1C: No results for input(s): "HGBA1C" in the last 72 hours. CBG: No results for input(s): "GLUCAP" in the last 168 hours. Lipid Profile: No results for input(s): "CHOL", "HDL", "LDLCALC", "TRIG", "CHOLHDL", "LDLDIRECT" in the last 72 hours. Thyroid Function Tests: No results for input(s): "TSH",  "T4TOTAL", "FREET4", "T3FREE", "THYROIDAB" in the last 72 hours. Anemia Panel: No results for input(s): "VITAMINB12", "FOLATE", "FERRITIN", "TIBC", "IRON", "  RETICCTPCT" in the last 72 hours. Urine analysis:    Component Value Date/Time   COLORURINE YELLOW 04/15/2018 1611   APPEARANCEUR CLEAR 04/15/2018 1611   LABSPEC 1.010 04/15/2018 1611   PHURINE 6.0 04/15/2018 1611   GLUCOSEU NEGATIVE 04/15/2018 1611   HGBUR LARGE (A) 04/15/2018 1611   BILIRUBINUR NEGATIVE 04/15/2018 1611   KETONESUR NEGATIVE 04/15/2018 1611   PROTEINUR >300 (A) 04/27/2021 0928   UROBILINOGEN 0.2 04/15/2018 1611   NITRITE NEGATIVE 04/15/2018 1611   LEUKOCYTESUR LARGE (A) 04/15/2018 1611   Sepsis Labs: @LABRCNTIP (procalcitonin:4,lacticidven:4) )No results found for this or any previous visit (from the past 240 hour(s)).   Radiological Exams on Admission: DG Tibia/Fibula Left  Result Date: 08/06/2022 CLINICAL DATA:  Status post fall. EXAM: LEFT TIBIA AND FIBULA - 2 VIEW COMPARISON:  None Available. FINDINGS: There is no evidence of fracture or other focal bone lesions. Soft tissues are unremarkable. IMPRESSION: Negative. Electronically Signed   By: Aram Candelahaddeus  Houston M.D.   On: 08/06/2022 02:44   DG Femur Min 2 Views Left  Result Date: 08/06/2022 CLINICAL DATA:  Status post fall. EXAM: LEFT FEMUR 2 VIEWS COMPARISON:  None Available. FINDINGS: An acute, mildly displaced fracture deformity is seen extending through the inter trochanteric region of the proximal left femur. There is no evidence of dislocation. Moderate to marked severity degenerative changes are seen involving the left hip, in the form of joint space narrowing and acetabular sclerosis. A benign-appearing sclerotic focus is seen within the left iliac bone, just above the level of the left acetabulum. Moderate severity vascular calcification is seen. Soft tissues are otherwise unremarkable. IMPRESSION: Acute, mildly displaced fracture of the proximal left  femur. Electronically Signed   By: Aram Candelahaddeus  Houston M.D.   On: 08/06/2022 02:43   DG Pelvis 1-2 Views  Result Date: 08/06/2022 CLINICAL DATA:  Status post fall. EXAM: PELVIS - 1-2 VIEW COMPARISON:  None Available. FINDINGS: A total right hip replacement is seen. There is no evidence of surrounding lucency to suggest the presence of hardware loosening or infection. An acute fracture deformity is seen extending through the inter trochanteric region of the proximal left femur. There is no evidence of dislocation. Moderate to marked severity degenerative changes are seen involving the left hip, in the form of joint space narrowing and acetabular sclerosis. IMPRESSION: 1. Acute fracture of the proximal left femur. 2. Intact total right hip replacement. Electronically Signed   By: Aram Candelahaddeus  Houston M.D.   On: 08/06/2022 02:39    EKG: Independently reviewed. Sinus tachycardia, rate 123.   Assessment/Plan   1. Left hip fracture  - Based on the available data, Mrs. Cristy FriedlanderFlorence presents an estimated 0.73% risk of perioperative MI or cardiac arrest  - Hold Eliquis (last dose was PM of 4/6), hold benazepril, continue pain-control, type & screen, transfuse RBC preoperatively     2. GIST of rectum  - Metastatic GIST followed by oncology and now under hospice care    3. Hypertension  - Hold benazepril perioperatively - Treat as-needed only for now    4. Anemia  - Hgb 6.5 this am, down from 8.0 on presentation to ED    - No overt bleeding  - Type and screen, transfuse 1 unit RBC and then repeat CBC   5. Hx of DVT/PE  - Remote RLE DVT and PE  - She last took Eliquis the night of 4/6, will hold preoperatively      DVT prophylaxis: SCD  Code Status: DNR  Level of Care: Level  of care: Telemetry Medical Family Communication: Husband and sister at bedside   Disposition Plan:  Patient is from: home  Anticipated d/c is to: TBD Anticipated d/c date is: 08/10/22  Patient currently: Pending orthopedic  surgery consultation and likely operative hip repair  Consults called: Orthopedic surgery  Admission status: Inpatient     Briscoe Deutscher, MD [facility]  08/06/2022, 4:01 AM

## 2022-08-06 NOTE — Transfer of Care (Signed)
Immediate Anesthesia Transfer of Care Note  Patient: Brianna Walker  Procedure(s) Performed: INTRAMEDULLARY (IM) NAIL HIP (Left: Hip)  Patient Location: PACU  Anesthesia Type:General  Level of Consciousness: awake, alert , and oriented  Airway & Oxygen Therapy: Patient Spontanous Breathing  Post-op Assessment: Report given to RN and Post -op Vital signs reviewed and stable  Post vital signs: Reviewed and stable  Last Vitals:  Vitals Value Taken Time  BP 120/79 08/06/22 1501  Temp    Pulse 93 08/06/22 1503  Resp 15 08/06/22 1503  SpO2 100 % 08/06/22 1503  Vitals shown include unvalidated device data.  Last Pain:  Vitals:   08/06/22 1313  TempSrc: Oral  PainSc:       Patients Stated Pain Goal: 3 (08/06/22 1150)  Complications: No notable events documented.

## 2022-08-06 NOTE — H&P (View-Only) (Signed)
   ORTHOPAEDIC CONSULTATION  REQUESTING PHYSICIAN: Danford, Christopher P, *  Chief Complaint: left hip pain  HPI: Brianna Walker is a 77 y.o. female who complains of pain in the left hip after falling at home. She was in the bathroom getting up from the toilet and her left leg gave out on her. She was unable to stand and bear weight. Currently in hospice for cancer. Has had shingles on the left leg for 3 weeks.   Imaging shows an acute, mildly displaced fracture deformity extending through the inter trochanteric region of the proximal left femur.    Orthopedics was consulted for evaluation.    No history of MI, CVA. + h/o PE & RLE DVT. Previously ambulatory with occasional use of assistive device.  The patient is living at home with family.    Past Medical History:  Diagnosis Date   AKI (acute kidney injury) 06/21/2015   ALLERGIC RHINITIS 09/12/2007   Arthritis    BACK PAIN 06/19/2008   Cramp of limb 08/11/2009   DEEP VENOUS THROMBOPHLEBITIS, LEG, RIGHT 03/04/2010   right leg   Diverticulosis    FREQUENCY, URINARY 10/07/2009   GERD 12/05/2006   gist    dx. 4'16 -oral chemotherapy only.   HEMORRHOIDS 11/17/2006   HIP PAIN, RIGHT, CHRONIC 08/11/2009   HX, PERSONAL, TUBERCULOSIS 11/17/2006   HYPERLIPIDEMIA 09/12/2007   Hyperlipidemia 09/12/2007   Qualifier: Diagnosis of  By: John MD, James W    HYPERTENSION 11/17/2006   INSOMNIA-SLEEP DISORDER-UNSPEC 06/19/2008   Iron deficiency anemia 12/07/2011   LEG PAIN, RIGHT 06/19/2008   Long term (current) use of anticoagulants 11/29/2010   MASS, SUPERFICIAL 09/12/2007   MENOPAUSAL DISORDER 09/20/2009   OVERACTIVE BLADDER 03/04/2010   Perforation of colon 11/17/2015   Pneumonia    PULMONARY EMBOLISM, HX OF 11/17/2006   [facility] s/p Pneumonia   RECTAL BLEEDING 10/07/2007   Rectal mass 06/2014   SCIATICA, RIGHT 09/12/2007   Shortness of breath dyspnea    With exertion since chemotherapy tx-oral meds   SKIN LESION 07/20/2009    TMJ SYNDROME 11/17/2006   Tuberculosis    pt. was tx.   Tubular adenoma of colon 2016   Past Surgical History:  Procedure Laterality Date   ABDOMINAL HYSTERECTOMY     CHOLECYSTECTOMY OPEN     COLONOSCOPY     EUS N/A 08/06/2014   Procedure: LOWER ENDOSCOPIC ULTRASOUND (EUS);  Surgeon: Daniel P Jacobs, MD;  [facility];  Service: Endoscopy;  Laterality: N/A;   EXCISION MASS ABDOMINAL N/A 01/28/2016   Procedure: EXCISION  ABDOMINAL WALL MASS, CYST, QUESTION FAT NECROSIS, QUESTION SEROMA;  Surgeon: Steven Gross, MD;  [facility];  Service: General;  Laterality: N/A;   hemorroid surgery  removed at dr starks office   x 2   ILEOSTOMY CLOSURE N/A 01/28/2016   Procedure: TAKEDOWN OF LOOP ILEOSTOMY;  Surgeon: Steven Gross, MD;  [facility];  Service: General;  Laterality: N/A;   INCISIONAL HERNIA REPAIR  2002     multiple ventral hernia repair/mesh   INCISIONAL HERNIA REPAIR  01/13/2005   open w onlay Proceed mesh.   LAPAROSCOPIC LYSIS OF ADHESIONS  06/04/2015   Procedure: LAPAROSCOPIC LYSIS OF ADHESIONS;  Surgeon: Steven Gross, MD;  [facility];  Service: General;;   PROCTOSCOPY N/A 01/28/2016   Procedure: RIGID PROCTOSCOPY, DILATION OF COLORECTAL ANASTOMOTIC STRICTURE;  Surgeon: Steven Gross, MD;  [facility];  Service: General;  Laterality: N/A;   s/p right hip   replaced min invasive hip surgury [facility] oct 2011 Right 2011   tumor removed off of right shoulder Right    XI ROBOTIC ASSISTED LOWER ANTERIOR RESECTION N/A 06/04/2015   Procedure: XI ROBOTIC ASSISTED LYSIS OF ADHESIONS, LOWER ANTERIOR RESECTION TRANS ABDOMINAL AND TRANS ANAL, COLOANAL HANDSEWN ANASTAOSIS, DIVERTING LOPE ILIOSTOMY, RESECTION GIST TUMOR;  Surgeon: Karie Soda, MD;  [facility];  Service: General;  Laterality: N/A;   Social History   Socioeconomic History   Marital status: Married    Spouse name: Not on file   Number of children: 2   Years of education: Not on file   Highest  education level: Not on file  Occupational History   Occupation: retired  Tobacco Use   Smoking status: Former    Packs/day: 0    Types: Cigarettes    Quit date: 06/09/1977    Years since quitting: 45.1   Smokeless tobacco: Never  Vaping Use   Vaping Use: Never used  Substance and Sexual Activity   Alcohol use: Yes    Alcohol/week: 0.0 standard drinks of alcohol    Comment: rare   Drug use: No    Comment: past marijuana   Sexual activity: Not on file  Other Topics Concern   Not on file  Social History Narrative   Husband civil 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[place]   Has #2 children (one son at home with her)   Prior Museum/gallery exhibitions officer service positions   Social Determinants of Health   Financial Resource Strain: Not on file  Food Insecurity: Not on file  Transportation Needs: No Transportation Needs (12/11/2018)   PRAPARE - Administrator, Civil Service (Medical): No    Lack of Transportation (Non-Medical): No  Physical Activity: Not on file  Stress: Not on file  Social Connections: Not on file   Family History  Problem Relation Age of Onset   Colon cancer Father    Heart disease Mother    Heart disease Sister    Diabetes Sister    Esophageal cancer Neg Hx    Stomach cancer Neg Hx    Rectal cancer Neg Hx    No Known Allergies Prior to Admission medications   Medication Sig Start Date End Date Taking? Authorizing Provider  apixaban (ELIQUIS) 5 MG TABS tablet TAKE 1 TABLET TWICE DAILY 04/02/22  Yes Corwin Levins, MD  estradiol (DOTTI) 0.025 MG/24HR APPLY  1  PATCH TOPICALLY TWICE WEEKLY Patient taking differently: Place 1 patch onto the skin 2 (two) times a week. 01/18/22  Yes Corwin Levins, MD  ferrous sulfate 325 (65 FE) MG tablet Take 1 tablet (325 mg total) by mouth 2 (two) times daily with a meal. Patient taking differently: Take 325 mg by mouth daily with breakfast. 06/26/16  Yes Ladene Artist, MD  HYDROmorphone (DILAUDID) 4 MG tablet Take 2-3 tablets (8-12 mg  total) by mouth every 4 (four) hours as needed for severe pain. 08/04/22  Yes Rana Snare, NP  magnesium oxide (MAG-OX) 400 (240 Mg) MG tablet TAKE 1 TABLET EVERY DAY Patient taking differently: Take 400 mg by mouth daily. 07/03/22  Yes Ladene Artist, MD  morphine (MS CONTIN) 60 MG 12 hr tablet Take 1 tablet (60 mg total) by mouth every 12 (twelve) hours. 05/11/22  Yes Rana Snare, NP  omeprazole (PRILOSEC) 40 MG capsule TAKE 1 CAPSULE EVERY DAY Patient taking differently: Take 40 mg by mouth daily. 04/02/22  Yes Corwin Levins, MD  ondansetron [facility]) 8  MG tablet TAKE 1 TABLET EVERY 8 (EIGHT) HOURS AS NEEDED FOR NAUSEA OR VOMITING. Patient taking differently: Take 8 mg by mouth every 8 (eight) hours as needed for nausea or vomiting. TAKE 1 TABLET EVERY 8 (EIGHT) HOURS AS NEEDED FOR NAUSEA OR VOMITING. 07/03/22  Yes Ladene ArtistSherrill, Gary B, MD  potassium chloride (MICRO-K) 10 MEQ CR capsule TAKE 2 CAPSULES EVERY DAY (SUBSTITUTED FOR  MICRO-K) Patient taking differently: Take 20 mEq by mouth daily. 11/09/21  Yes Ladene ArtistSherrill, Gary B, MD  predniSONE (DELTASONE) 10 MG tablet Take 1 tablet (10 mg total) by mouth daily with breakfast. 06/15/22  Yes Ladene ArtistSherrill, Gary B, MD  Vitamin D, Ergocalciferol, (DRISDOL) 50000 units CAPS capsule Take 1 capsule (50,000 Units total) by mouth every 7 (seven) days. 10/10/16  Yes Corwin LevinsJohn, James W, MD  atorvastatin (LIPITOR) 40 MG tablet TAKE 1 TABLET EVERY DAY Patient not taking: Reported on 08/06/2022 04/02/22   Corwin LevinsJohn, James W, MD  benazepril (LOTENSIN) 40 MG tablet TAKE 1 TABLET EVERY EVENING Patient not taking: Reported on 08/06/2022 04/02/22   Corwin LevinsJohn, James W, MD  diphenoxylate-atropine (LOMOTIL) 2.5-0.025 MG tablet Take 1-2 tablets by mouth 4 times daily as needed for diarrhea or loose stool (max of 8 tablets daily) Patient not taking: Reported on 04/12/2022 12/29/21   Ladene ArtistSherrill, Gary B, MD  loperamide (IMODIUM) 2 MG capsule Take 1-2 capsules (2-4 mg total) by mouth every 8 (eight) hours as  needed for diarrhea or loose stools (Use if >2 BM every 8 hours). Patient not taking: Reported on 04/12/2022 09/05/21   Rana Snarehomas, Lisa K, NP  ripretinib [facility](QINLOCK) 50 MG tablet Take 3 tablets (150 mg total) by mouth daily. Patient not taking: Reported on 08/06/2022 04/07/22   Ladene ArtistSherrill, Gary B, MD  solifenacin (VESICARE) 5 MG tablet TAKE 1 TABLET EVERY DAY Patient not taking: Reported on 04/12/2022 04/02/22   Corwin LevinsJohn, James W, MD   DG CHEST PORT 1 VIEW  Result Date: 08/06/2022 CLINICAL DATA:  77 year old female history of left hip fracture. Preoperative study. EXAM: PORTABLE CHEST 1 VIEW COMPARISON:  Chest x-ray 06/28/2015. FINDINGS: Extensive skin fold artifact projecting over the right hemithorax. Lung volumes are normal. No consolidative airspace disease. No pleural effusions. No pneumothorax. No pulmonary nodule or mass noted. Pulmonary vasculature and the cardiomediastinal silhouette are within normal limits. Atherosclerosis in the thoracic aorta. IMPRESSION: 1.  No radiographic evidence of acute cardiopulmonary disease. 2. Aortic atherosclerosis. Electronically Signed   By: Trudie Reedaniel  Entrikin M.D.   On: 08/06/2022 05:22   DG Tibia/Fibula Left  Result Date: 08/06/2022 CLINICAL DATA:  Status post fall. EXAM: LEFT TIBIA AND FIBULA - 2 VIEW COMPARISON:  None Available. FINDINGS: There is no evidence of fracture or other focal bone lesions. Soft tissues are unremarkable. IMPRESSION: Negative. Electronically Signed   By: Aram Candelahaddeus  Houston M.D.   On: 08/06/2022 02:44   DG Femur Min 2 Views Left  Result Date: 08/06/2022 CLINICAL DATA:  Status post fall. EXAM: LEFT FEMUR 2 VIEWS COMPARISON:  None Available. FINDINGS: An acute, mildly displaced fracture deformity is seen extending through the inter trochanteric region of the proximal left femur. There is no evidence of dislocation. Moderate to marked severity degenerative changes are seen involving the left hip, in the form of joint space narrowing and acetabular  sclerosis. A benign-appearing sclerotic focus is seen within the left iliac bone, just above the level of the left acetabulum. Moderate severity vascular calcification is seen. Soft tissues are otherwise unremarkable. IMPRESSION: Acute, mildly displaced fracture of the proximal  left femur. Electronically Signed   By: [name] M.D.   On: 08/06/2022 02:43   DG Pelvis 1-2 Views  Result Date: 08/06/2022 CLINICAL DATA:  Status post fall. EXAM: PELVIS - 1-2 VIEW COMPARISON:  None Available. FINDINGS: A total right hip replacement is seen. There is no evidence of surrounding lucency to suggest the presence of hardware loosening or infection. An acute fracture deformity is seen extending through the inter trochanteric region of the proximal left femur. There is no evidence of dislocation. Moderate to marked severity degenerative changes are seen involving the left hip, in the form of joint space narrowing and acetabular sclerosis. IMPRESSION: 1. Acute fracture of the proximal left femur. 2. Intact total right hip replacement. Electronically Signed   By: [name] M.D.   On: 08/06/2022 02:39    Positive ROS: All other systems have been reviewed and were otherwise negative with the exception of those mentioned in the HPI and as above.  Objective: Labs cbc Recent Labs    08/06/22 0147 08/06/22 0437  WBC 8.6 5.8  HGB 8.0* 6.5*  HCT 27.4* 22.4*  PLT 312 234    Labs inflam No results for input(s): "CRP" in the last 72 hours.  Invalid input(s): "ESR"  Labs coag No results for input(s): "INR", "PTT" in the last 72 hours.  Invalid input(s): "PT"  Recent Labs    08/06/22 0147 08/06/22 0437  NA 142 142  K 4.6 4.4  CL 110 116*  CO2 20* 20*  GLUCOSE 99 84  BUN 14 14  CREATININE 0.98 0.91  CALCIUM 8.6* 7.8*    Physical Exam: Vitals:   08/06/22 0730 08/06/22 0740  BP: 105/62   Pulse: 90 96  Resp: 15 18  Temp:    SpO2: 100% 100%   General: Alert, no acute distress.   Laying flat in bed, calm Mental status: Alert and Oriented x3 Neurologic: Speech Clear and organized, no gross focal findings or movement disorder appreciated. Respiratory: No cyanosis, no use of accessory musculature Cardiovascular: No pedal edema GI: Abdomen is soft and non-tender, non-distended. Skin: Warm and dry. Multiple lesions on left leg below the knee from shingles. Covered with Mepilex bandages. No lesions in the area of chief complaint. Extremities: Warm and well perfused w/o edema Psychiatric: Patient is competent for consent with normal mood and affect  MUSCULOSKELETAL:  TTP left hip, limited ROM d/t pain, NVI Other extremities are atraumatic with painless ROM and NVI.  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 Keep NPO.   Weightbearing: NWB LLE Orthopedic device(s): None VTE prophylaxis:  on hold for surgery   Pain control: PRN Follow - up plan: 2 weeks postop Contact information:  Margarita Rana MD, [facility] PA-C  Jenne Pane PA-C Office [phone] 08/06/2022 7:59 AM

## 2022-08-06 NOTE — ED Provider Notes (Signed)
[facility]PT [facility] Emergency Department Provider Note MRN:  [id number]  Arrival date & time: 08/06/22     Chief Complaint   Fall   History of Present Illness   Brianna AmorMary Artis Cristy Walker is a 77 y.o. year-old female with a history of colon cancer presenting to the ED with chief complaint of fall.  Leg gave out and she fell onto her left hip at home this evening.  On hospice for colon cancer.  Denies head trauma, no other complaints.  Review of Systems  A thorough review of systems was obtained and all systems are negative except as noted in the HPI and PMH.   Patient's Health History    Past Medical History:  Diagnosis Date   AKI (acute kidney injury) 06/21/2015   ALLERGIC RHINITIS 09/12/2007   Arthritis    BACK PAIN 06/19/2008   Cramp of limb 08/11/2009   DEEP VENOUS THROMBOPHLEBITIS, LEG, RIGHT 03/04/2010   right leg   Diverticulosis    FREQUENCY, URINARY 10/07/2009   GERD 12/05/2006   gist    dx. 4'16 -oral chemotherapy only.   HEMORRHOIDS 11/17/2006   HIP PAIN, RIGHT, CHRONIC 08/11/2009   HX, PERSONAL, TUBERCULOSIS 11/17/2006   HYPERLIPIDEMIA 09/12/2007   Hyperlipidemia 09/12/2007   Qualifier: Diagnosis of  By: Jonny RuizJohn MD, Len BlalockJames W    HYPERTENSION 11/17/2006   INSOMNIA-SLEEP DISORDER-UNSPEC 06/19/2008   Iron deficiency anemia 12/07/2011   LEG PAIN, RIGHT 06/19/2008   Long term (current) use of anticoagulants 11/29/2010   MASS, SUPERFICIAL 09/12/2007   MENOPAUSAL DISORDER 09/20/2009   OVERACTIVE BLADDER 03/04/2010   Perforation of colon 11/17/2015   Pneumonia    PULMONARY EMBOLISM, HX OF 11/17/2006   [facility] Pneumonia   RECTAL BLEEDING 10/07/2007   Rectal mass 06/2014   SCIATICA, RIGHT 09/12/2007   Shortness of breath dyspnea    With exertion since chemotherapy tx-oral meds   SKIN LESION 07/20/2009   TMJ SYNDROME 11/17/2006   Tuberculosis    pt. was tx.   Tubular adenoma of colon 2016    Past Surgical History:  Procedure Laterality Date    ABDOMINAL HYSTERECTOMY     CHOLECYSTECTOMY OPEN     COLONOSCOPY     EUS N/A 08/06/2014   Procedure: LOWER ENDOSCOPIC ULTRASOUND (EUS);  Surgeon: Rachael Feeaniel P Jacobs, MD;  [facility];  Service: Endoscopy;  Laterality: N/A;   EXCISION MASS ABDOMINAL N/A 01/28/2016   Procedure: EXCISION  ABDOMINAL WALL MASS, CYST, QUESTION FAT NECROSIS, QUESTION SEROMA;  Surgeon: Karie SodaSteven Gross, MD;  [facility];  Service: General;  Laterality: N/A;   hemorroid surgery  removed at dr Anselm Junglingstarks office   x 2   ILEOSTOMY CLOSURE N/A 01/28/2016   Procedure: TAKEDOWN OF LOOP ILEOSTOMY;  Surgeon: Karie SodaSteven Gross, MD;  [facility];  Service: General;  Laterality: N/A;   INCISIONAL HERNIA REPAIR  2002     multiple ventral hernia repair/mesh   INCISIONAL HERNIA REPAIR  01/13/2005   open w onlay Proceed mesh.   LAPAROSCOPIC LYSIS OF ADHESIONS  06/04/2015   Procedure: LAPAROSCOPIC LYSIS OF ADHESIONS;  Surgeon: Karie SodaSteven Gross, MD;  [facility];  Service: General;;   PROCTOSCOPY N/A 01/28/2016   Procedure: RIGID PROCTOSCOPY, DILATION OF COLORECTAL ANASTOMOTIC STRICTURE;  Surgeon: Karie SodaSteven Gross, MD;  [facility];  Service: General;  Laterality: N/A;   s/p right hip replaced min invasive hip surgury [facility] oct 2011 Right 2011   tumor removed off of right shoulder Right    XI  ROBOTIC ASSISTED LOWER ANTERIOR RESECTION N/A 06/04/2015   Procedure: XI ROBOTIC ASSISTED LYSIS OF ADHESIONS, LOWER ANTERIOR RESECTION TRANS ABDOMINAL AND TRANS ANAL, COLOANAL HANDSEWN ANASTAOSIS, DIVERTING LOPE ILIOSTOMY, RESECTION GIST TUMOR;  Surgeon: Karie Soda, MD;  [facility];  Service: General;  Laterality: N/A;    Family History  Problem Relation Age of Onset   Colon cancer Father    Heart disease Mother    Heart disease Sister    Diabetes Sister    Esophageal cancer Neg Hx    Stomach cancer Neg Hx    Rectal cancer Neg Hx     Social History   Socioeconomic History   Marital status: Married    Spouse name: Not on  file   Number of children: 2   Years of education: Not on file   Highest education level: Not on file  Occupational History   Occupation: retired  Tobacco Use   Smoking status: Former    Packs/day: 0    Types: Cigarettes    Quit date: 06/09/1977    Years since quitting: 45.1   Smokeless tobacco: Never  Vaping Use   Vaping Use: Never used  Substance and Sexual Activity   Alcohol use: Yes    Alcohol/week: 0.0 standard drinks of alcohol    Comment: rare   Drug use: No    Comment: past marijuana   Sexual activity: Not on file  Other Topics Concern   Not on file  Social History Narrative   Husband civil 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[place]   Has #2 children (one son at home with her)   Prior Museum/gallery exhibitions officer service positions   Social Determinants of Health   Financial Resource Strain: Not on file  Food Insecurity: Not on file  Transportation Needs: No Transportation Needs (12/11/2018)   PRAPARE - Administrator, Civil Service (Medical): No    Lack of Transportation (Non-Medical): No  Physical Activity: Not on file  Stress: Not on file  Social Connections: Not on file  Intimate Partner Violence: Not on file     Physical Exam   Vitals:   08/06/22 0133  BP: 111/75  Pulse: (!) 126  Resp: 18  Temp: 97.8 F (36.6 C)  SpO2: 100%    CONSTITUTIONAL: Well-appearing, NAD, moderate distress due to pain NEURO/PSYCH:  Alert and oriented x 3, no focal deficits EYES:  eyes equal and reactive ENT/NECK:  no LAD, no JVD CARDIO: Tachycardic rate, well-perfused, normal S1 and S2 PULM:  CTAB no wheezing or rhonchi GI/GU:  non-distended, non-tender MSK/SPINE:  No gross deformities, no edema SKIN:  no rash, atraumatic   *Additional and/or pertinent findings included in MDM below  Diagnostic and Interventional Summary    EKG Interpretation  Date/Time:  Sunday August 06 2022 01:33:51 EDT Ventricular Rate:  123 PR Interval:  120 QRS Duration: 98 QT Interval:  283 QTC  Calculation: 407 R Axis:   39 Text Interpretation: Sinus tachycardia Confirmed by Kennis Carina [phone]) on 08/06/2022 1:47:20 AM       Labs Reviewed  CBC - Abnormal; Notable for the following components:      Result Value   RBC 3.30 (*)    Hemoglobin 8.0 (*)    HCT 27.4 (*)    MCH 24.2 (*)    MCHC 29.2 (*)    RDW 19.7 (*)    All other components within normal limits  COMPREHENSIVE METABOLIC PANEL    DG Femur Min 2 Views Left    (Results Pending)  DG Tibia/Fibula Left    (Results Pending)  DG Pelvis 1-2 Views    (Results Pending)    Medications  HYDROmorphone (DILAUDID) injection 1 mg (1 mg Intravenous Given 08/06/22 0148)     Procedures  /  Critical Care Procedures  ED Course and Medical Decision Making  Initial Impression and Ddx Suspicious for hip fracture given the internal rotation and shortening of the left lower extremity.  Neurovascularly intact, mildly tachycardic with otherwise reassuring vital signs.  On hospice for stage IV colon cancer.  Awaiting x-ray.  Past medical/surgical history that increases complexity of ED encounter: Stage IV colon cancer  Interpretation of Diagnostics I personally reviewed the hip x-ray and my interpretation is as follows: Intertrochanteric fracture    Patient Reassessment and Ultimate Disposition/Management     Discussed with Dr. [name] of orthopedics, suspect surgery will occur later this morning.  N.p.o., will admit to medicine.  Patient management required discussion with the following services or consulting groups:  Hospitalist Service and Orthopedic Surgery  Complexity of Problems Addressed Acute illness or injury that poses threat of life of bodily function  Additional Data Reviewed and Analyzed Further history obtained from: Further history from spouse/family member  Additional Factors Impacting ED Encounter Risk Use of parenteral controlled substances and Consideration of hospitalization  Elmer Sow. Pilar Plate, MD [facility]  Health Emergency Medicine [facility] [email or web address]  Final Clinical Impressions(s) / ED Diagnoses     ICD-10-CM   1. Closed displaced intertrochanteric fracture of left femur, initial encounter  Y33.383A       ED Discharge Orders     None        Discharge Instructions Discussed with and Provided to Patient:   Discharge Instructions   None      Sabas Sous, MD 08/06/22 0230

## 2022-08-06 NOTE — Op Note (Signed)
DATE OF SURGERY:  08/06/2022  TIME: 2:35 PM  PATIENT NAME:  Brianna Walker  AGE: 77 y.o.  PRE-OPERATIVE DIAGNOSIS:  LEFT HIP FRACTURE  POST-OPERATIVE DIAGNOSIS:  SAME  PROCEDURE:  INTRAMEDULLARY (IM) NAIL HIP  SURGEON:  Sheral Apley  ASSISTANT:  Levester Fresh, PA-C, she was present and scrubbed throughout the case, critical for completion in a timely fashion, and for retraction, instrumentation, and closure.   OPERATIVE IMPLANTS: Stryker Gamma Nail  PREOPERATIVE INDICATIONS:  [name] is a 77 y.o. year old who fell and suffered a hip fracture. She was brought into the ER and then admitted and optimized and then elected for surgical intervention.    The risks benefits and alternatives were discussed with the patient including but not limited to the risks of nonoperative treatment, versus surgical intervention including infection, bleeding, nerve injury, malunion, nonunion, hardware prominence, hardware failure, need for hardware removal, blood clots, cardiopulmonary complications, morbidity, mortality, among others, and they were willing to proceed.    OPERATIVE PROCEDURE:  The patient was brought to the operating room and placed in the supine position. General anesthesia was administered. She was placed on the fracture table.  Closed reduction was performed under C-arm guidance. Time out was then performed after sterile prep and drape. She received preoperative antibiotics.  I first performed a closed manipulation of the fracture to reduce it on the OR table.  Incision was made proximal to the greater trochanter. A guidewire was placed in the appropriate position. Confirmation was made on AP and lateral views. The above-named nail was opened. I opened the proximal femur with a reamer. I then placed the nail by hand easily down. I did not need to ream the femur.  Once the nail was completely seated, I placed a guidepin into the femoral head into the center center  position. I measured the length, and then reamed the lateral cortex and up into the head. I then placed the lag screw. Slight compression was applied. Anatomic fixation achieved.  I then secured the proximal interlocking bolt, and took off a half a turn, and then removed the instruments, and took final C-arm pictures AP and lateral the entire length of the leg.  I then placed a distal interlock screw.   Anatomic reconstruction was achieved, and the wounds were irrigated copiously and closed with a layered closure. The patient was awakened and returned to PACU in stable and satisfactory condition. There no complications and the patient tolerated the procedure well.  She will be weightbearing as tolerated, and will be on chemical px  for a period of four weeks after discharge.   Margarita Rana, M.D.

## 2022-08-06 NOTE — ED Triage Notes (Addendum)
Pt on hospice care. Hx of stomach and bowel cancer. No cancer treatments at this time. Fell at 2100 on 08/05/22 at home. Left femur pain. Pt took 4 mg of Dilaudid PO.  No DNR form via EMS but patient claims she is.     Vitals en route  104/68 96% RA 120 HR 18 RR

## 2022-08-07 ENCOUNTER — Other Ambulatory Visit: Payer: Self-pay

## 2022-08-07 DIAGNOSIS — S72002A Fracture of unspecified part of neck of left femur, initial encounter for closed fracture: Secondary | ICD-10-CM | POA: Diagnosis not present

## 2022-08-07 DIAGNOSIS — L8990 Pressure ulcer of unspecified site, unspecified stage: Secondary | ICD-10-CM | POA: Insufficient documentation

## 2022-08-07 LAB — BASIC METABOLIC PANEL
Anion gap: 7 (ref 5–15)
BUN: 15 mg/dL (ref 8–23)
CO2: 20 mmol/L — ABNORMAL LOW (ref 22–32)
Calcium: 7.8 mg/dL — ABNORMAL LOW (ref 8.9–10.3)
Chloride: 111 mmol/L (ref 98–111)
Creatinine, Ser: 1.05 mg/dL — ABNORMAL HIGH (ref 0.44–1.00)
GFR, Estimated: 56 mL/min — ABNORMAL LOW (ref >60)
Glucose, Bld: 96 mg/dL (ref 70–99)
Potassium: 3.7 mmol/L (ref 3.5–5.1)
Sodium: 138 mmol/L (ref 135–145)

## 2022-08-07 LAB — TYPE AND SCREEN
ABO/RH(D): O POS
Antibody Screen: NEGATIVE
Unit division: 0
Unit division: 0

## 2022-08-07 LAB — BPAM RBC
Blood Product Expiration Date: 202405052359
ISSUE DATE / TIME: 202404070606
ISSUE DATE / TIME: 202404071243
Unit Type and Rh: 5100

## 2022-08-07 LAB — CBC
HCT: 29.3 % — ABNORMAL LOW (ref 36.0–46.0)
Hemoglobin: 9.7 g/dL — ABNORMAL LOW (ref 12.0–15.0)
MCH: 26.9 pg (ref 26.0–34.0)
MCHC: 33.1 g/dL (ref 30.0–36.0)
MCV: 81.2 fL (ref 80.0–100.0)
Platelets: 238 10*3/uL (ref 150–400)
RBC: 3.61 MIL/uL — ABNORMAL LOW (ref 3.87–5.11)
RDW: 18.3 % — ABNORMAL HIGH (ref 11.5–15.5)
WBC: 7.5 10*3/uL (ref 4.0–10.5)
nRBC: 0 % (ref 0.0–0.2)

## 2022-08-07 MED ORDER — APIXABAN 5 MG PO TABS
5.0000 mg | ORAL_TABLET | Freq: Two times a day (BID) | ORAL | Status: DC
  Administered 2022-08-07 – 2022-08-09 (×5): 5 mg via ORAL
  Filled 2022-08-07 (×5): qty 1

## 2022-08-07 MED ORDER — BOOST PLUS PO LIQD
237.0000 mL | Freq: Three times a day (TID) | ORAL | Status: DC
  Administered 2022-08-08 – 2022-08-09 (×4): 237 mL via ORAL
  Filled 2022-08-07 (×8): qty 237

## 2022-08-07 MED ORDER — ACETAMINOPHEN 500 MG PO TABS
1000.0000 mg | ORAL_TABLET | Freq: Three times a day (TID) | ORAL | Status: DC
  Administered 2022-08-07 – 2022-08-09 (×5): 1000 mg via ORAL
  Filled 2022-08-07 (×6): qty 2

## 2022-08-07 NOTE — Assessment & Plan Note (Signed)
S/p IM Nail by Dr. [name] on 4/7 - WBAT - Continue home Eliquis for DVT PPx

## 2022-08-07 NOTE — TOC CAGE-AID Note (Signed)
Transition of Care [facility]) - CAGE-AID Screening  Patient Details  Name: Brianna Walker MRN: [id number] Date of Birth: 10-13-1945  Clinical Narrative:  Patient to ED after a fall at home causing a hip fx. Patient on hospice for terminal colon cancer and is a DNR. Patient inappropriate for substance abuse screening at this time.  CAGE-AID Screening: Substance Abuse Screening unable to be completed due to: : Patient unable to participate

## 2022-08-07 NOTE — Evaluation (Addendum)
Physical Therapy Evaluation Patient Details Name: Brianna Walker MRN: [id number] DOB: 01/27/1946 Today's Date: 08/07/2022  History of Present Illness  Pt is a 77 y/o F s/p intramedullary nail L hip after a fall resulting in L hip fx. PMH includes GIST of rectum, hypertension, anemia, and hx DVT/PE.  Clinical Impression  Received pt semi-reclined in bed with sister present at bedside. Pt agreeable to PT evaluation but limited by pain in L hip. Pt required mod A for bed mobility with HOB elevated and use of bedrails with cues for rolling technique. Pt required assist for LLE management and trunk elevation but able to scoot hips with EOB without assist. Pt stood from elevated EOB with RW and min A but unable to advance LLE or weight shift onto LLE to step, and requested to return to bed due to pain/fatigue. Pt reports living with multiple family members and will have 24/7 supervision upon D/C. Pt's biggest barrier is 15 steps to get to bedroom/bathroom on 2nd floor and only has access to 1/2 bath on main level. Provided information on AIR and pt/family interested. Pt will benefit from continued skilled PT services to address current impairments. Acute PT to cont to follow.      Recommendations for follow up therapy are one component of a multi-disciplinary discharge planning process, led by the attending physician.  Recommendations may be updated based on patient status, additional functional criteria and insurance authorization.  Follow Up Recommendations       Assistance Recommended at Discharge Frequent or constant Supervision/Assistance  Patient can return home with the following  A lot of help with walking and/or transfers;A lot of help with bathing/dressing/bathroom;Assistance with cooking/housework;Direct supervision/assist for medications management;Direct supervision/assist for financial management;Assist for transportation;Help with stairs or ramp for entrance    Equipment  Recommendations Other (comment) (TBD)  Recommendations for Other Services  Rehab consult    Functional Status Assessment Patient has had a recent decline in their functional status and demonstrates the ability to make significant improvements in function in a reasonable and predictable amount of time.     Precautions / Restrictions Precautions Precautions: Fall Restrictions Weight Bearing Restrictions: No LLE Weight Bearing: Weight bearing as tolerated      Mobility  Bed Mobility Overal bed mobility: Needs Assistance Bed Mobility: Rolling, Supine to Sit, Sit to Supine Rolling: Mod assist   Supine to sit: Mod assist, HOB elevated Sit to supine: Mod assist   General bed mobility comments: HOB elevated and use of bedrails. Pt required cues for technique and assist for RLE management as well as for trunk elevation. Pt was able to scoot to EOB with increaesd time but without any physical assist. Patient Response: Cooperative  Transfers Overall transfer level: Needs assistance Equipment used: Rolling walker (2 wheels) Transfers: Sit to/from Stand Sit to Stand: Min assist           General transfer comment: stood from elevated EOB with RW. Pt required cues for hand placement when standing    Ambulation/Gait               General Gait Details: unable to advance LLE  Stairs            Wheelchair Mobility    Modified Rankin (Stroke Patients Only)       Balance Overall balance assessment: Needs assistance Sitting-balance support: Bilateral upper extremity supported, Feet supported Sitting balance-Leahy Scale: Fair Sitting balance - Comments: able to maintain static sitting balance with close supervision   Standing  balance support: Bilateral upper extremity supported, Reliant on assistive device for balance (RW) Standing balance-Leahy Scale: Poor Standing balance comment: required min guard for static standing balance. Able to remain standing for ~2-3  minutes while changing out dirty chuck pad                             Pertinent Vitals/Pain Pain Assessment Pain Assessment: 0-10 Pain Score: 7  Pain Location: L hip Pain Descriptors / Indicators: Aching, Discomfort, Grimacing, Guarding, Operative site guarding, Sore Pain Intervention(s): Limited activity within patient's tolerance, Monitored during session, Premedicated before session, Repositioned    Home Living Family/patient expects to be discharged to:: Private residence Living Arrangements: Children;Spouse/significant other (husband, sister, son, and grandson) Available Help at Discharge: Family;Available 24 hours/day Type of Home: House Home Access: Stairs to enter   [employer] of Steps: threshold Alternate Level Stairs-Number of Steps: 15 Home Layout: Two level;Bed/bath upstairs;1/2 bath on main level Home Equipment: Agricultural consultant (2 wheels) Additional Comments: pt reports using RW a few days prior to admission    Prior Function Prior Level of Function : Independent/Modified Independent                     Hand Dominance   Dominant Hand: Right    Extremity/Trunk Assessment   Upper Extremity Assessment Upper Extremity Assessment: Defer to OT evaluation    Lower Extremity Assessment Lower Extremity Assessment: Generalized weakness;LLE deficits/detail;RLE deficits/detail RLE Sensation: WNL LLE Sensation: WNL    Cervical / Trunk Assessment Cervical / Trunk Assessment: Kyphotic (mild)  Communication   Communication: No difficulties  Cognition Arousal/Alertness: Awake/alert Behavior During Therapy: WFL for tasks assessed/performed Overall Cognitive Status: Within Functional Limits for tasks assessed                                 General Comments: pt pleasant, cooperative, and highly motivated to return home        General Comments General comments (skin integrity, edema, etc.): pt's sister present at  bedside    Exercises     Assessment/Plan    PT Assessment Patient needs continued PT services  PT Problem List Decreased strength;Decreased range of motion;Decreased activity tolerance;Decreased balance;Decreased mobility;Decreased coordination;Pain       PT Treatment Interventions DME instruction;Gait training;Stair training;Functional mobility training;Therapeutic activities;Therapeutic exercise;Patient/family education;Neuromuscular re-education;Balance training;Wheelchair mobility training;Cognitive remediation    PT Goals (Current goals can be found in the Care Plan section)  Acute Rehab PT Goals Patient Stated Goal: to return home PT Goal Formulation: With patient/family Time For Goal Achievement: 08/14/22 Potential to Achieve Goals: Good    Frequency Min 5X/week     Co-evaluation               AM-PAC PT "6 Clicks" Mobility  Outcome Measure Help needed turning from your back to your side while in a flat bed without using bedrails?: A Lot Help needed moving from lying on your back to sitting on the side of a flat bed without using bedrails?: A Lot Help needed moving to and from a bed to a chair (including a wheelchair)?: A Lot Help needed standing up from a chair using your arms (e.g., wheelchair or bedside chair)?: A Little Help needed to walk in hospital room?: Total Help needed climbing 3-5 steps with a railing? : Total 6 Click Score: 11    End of Session Equipment  Utilized During Treatment: Gait belt Activity Tolerance: Patient limited by pain;Patient limited by fatigue Patient left: in bed;with call bell/phone within reach;with bed alarm set;with family/visitor present Nurse Communication: Mobility status PT Visit Diagnosis: Unsteadiness on feet (R26.81);Other abnormalities of gait and mobility (R26.89);Muscle weakness (generalized) (M62.81);Pain Pain - Right/Left: Left Pain - part of body: Hip    Time: 0832-0902 PT Time Calculation (min) (ACUTE ONLY):  30 min   Charges:   PT Evaluation $PT Eval Moderate Complexity: 1 Mod PT Treatments $Therapeutic Activity: 8-22 mins        Raechel Chute PT, DPT  Alfonso Patten 08/07/2022, 9:30 AM

## 2022-08-07 NOTE — Assessment & Plan Note (Signed)
Coccyx, stage III, present on admission - WOC consult

## 2022-08-07 NOTE — Progress Notes (Signed)
Initial Nutrition Assessment  DOCUMENTATION CODES:   Underweight  INTERVENTION:  Liberalize diet to regular Boost Plus po TID, each supplement provides 360 kcal and 14 grams of protein Resume appetite stimulant once family able to determine which pt has been on at home  NUTRITION DIAGNOSIS:   Increased nutrient needs related to chronic illness, cancer and cancer related treatments as evidenced by estimated needs.  GOAL:   Patient will meet greater than or equal to 90% of their needs  MONITOR:   PO intake, Supplement acceptance, Labs, Weight trends  REASON FOR ASSESSMENT:   Consult Hip fracture protocol  ASSESSMENT:   Pt admitted after a fall leading to L hip fracture s/p IMN. PMH significant for HTN, rectal cancer on home hospice care, chronic pain and history of VTE.   Spoke with pt at bedside. Her sister and husband present as well. Pt states that she has been eating well although does not provide dietary recall. She takes an appetite stimulant at home which has been very helpful for her. She is uncertain of the name/type of medication she has been taking and it is not listed on home meds. Encouraged her family to check when they get home and inform nursing so she can remain on this during admission. She has been drinking Boost at home and is agreeable to receiving them during admission.   Meal completions: 4/8: 100% breakfast, 25% lunch  Reviewed weight history. Pt's weight has remained fairly stable at 42 kg within the last 4 months. Prior to this, pt is noted to have had a weight loss of 20.4% between 08/15/21-04/12/22 which is clinically significant for time frame.   Medications: colace, morphine, protonix, prednisone  Labs: Cr 1.05, GFR 56  NUTRITION - FOCUSED PHYSICAL EXAM: Deferred to follow up.   Diet Order:   Diet Order             Diet regular Room service appropriate? Yes; Fluid consistency: Thin  Diet effective now                   EDUCATION  NEEDS:   No education needs have been identified at this time  Skin:  Skin Assessment: Skin Integrity Issues: Skin Integrity Issues:: Stage III, Incisions Stage III: coccyx Incisions: L thigh  Last BM:  4/6  Height:   Ht Readings from Last 1 Encounters:  08/06/22 5' 4.02" (1.626 m)    Weight:   Wt Readings from Last 1 Encounters:  08/06/22 42.1 kg   BMI:  Body mass index is 15.92 kg/m.  Estimated Nutritional Needs:   Kcal:  1400-1600  Protein:  70-85g  Fluid:  1.4-1.6L  Drusilla Kanner, RDN, LDN Clinical Nutrition

## 2022-08-07 NOTE — Assessment & Plan Note (Signed)
Continue atorvastatin

## 2022-08-07 NOTE — Progress Notes (Addendum)
[facility] 5N[facility]) hospitalized hospice patient visit   Brianna Walker is a current [facility] hospice patient with a terminal diagnosis of Rectal Cancer. She had a fall at home and was transported to the ED for evaluation. She was admitted to the hospital on 4.7.24 with a diagnosis of femur fracture and underwent IM nailing. Per Dr. Patric Dykes with [facility] this is a related hospital admission.    Brianna Walker underwent IM nailing 08/06/22. Rounded with patient in room, sister at bedside.  Patient reports she has been up out of bed this morning and is currently experiencing some pain, 7/10.  [facility] Liaison process of following daily to patient and sister.  They currently have no questions and are appreciative of the support.  Patient states she may need bedside commode at home.  Explained that liaison team will order equipment for home use once we have a more solidified discharge plan.  Exchanged report with bedside nurse, made aware of need for pain medications and purewick per patient request. She is inpatient appropriate due to need for surgical intervention/IV medications and skilled after care.   Vital Signs-98.1/74/18   120/82    100% on room air Intake/Output- not yet recorded Abnormal labs- RBC 3.61, HgB 9.7, HCT 29.3, CO2 20, Creatinine 1.05, Calcium 7.8 IV/PRN Meds- Dilaudid 1mg  IV q4, NS @75cc /H, Robaxin 500mg  PO q6, Oxycodone IR 10mg  q4   Problem List-  No hospitalist MD epic note posted at this time.   Per RN plan is to ambulate patient with PT today with possible rehab disposition once medically stable. 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 Expressed desire to go home with [facility] services. IS currently on hospice care.      Discharge Planning- Ongoing, likely back to home once stable Family Contact- Spoke with husband via phone.  He states he has not spoken with MD yet today but will be staying overnight this evening.  He is unaware of equipment needs at home but is aware liaison team will follow up daily for additional needs. IDT- Updated ACC team on current patient condition Goals of care - Clear DNR; patient reiterated hospice support with discharge. Please utilize [facility] for discharge as we contract with them for our patients.    Doreatha Martin, RN, BSN [facility] (on [address]) [phone]

## 2022-08-07 NOTE — Assessment & Plan Note (Signed)
Prsented with Hgb 6.5. Transfused and Hgb post-op up to 9.7, stable today, no clinical bleeding - Trend Hgb

## 2022-08-07 NOTE — Assessment & Plan Note (Signed)
BP soft - Hold home benazepril

## 2022-08-07 NOTE — Progress Notes (Signed)
    Subjective: Patient reports pain as moderate.  Tolerating diet.  Urinating.   No CP, SOB.  Has mobilized very little OOB with PT this morning.  Objective:   VITALS:   Vitals:   08/06/22 1937 08/06/22 2327 08/07/22 0311 08/07/22 0700  BP: 107/65 119/86 112/78 120/82  Pulse: (!) 109 99 74   Resp: 18 20 18    Temp: 98.5 F (36.9 C) 98.4 F (36.9 C) 98.3 F (36.8 C) 98.1 F (36.7 C)  TempSrc: Oral  Oral Oral  SpO2: 100% 100%    Weight:      Height:          Latest Ref Rng & Units 08/07/2022    3:00 AM 08/06/2022    4:37 AM 08/06/2022    1:47 AM  CBC  WBC 4.0 - 10.5 K/uL 7.5  5.8  8.6   Hemoglobin 12.0 - 15.0 g/dL 9.7  6.5  8.0   Hematocrit 36.0 - 46.0 % 29.3  22.4  27.4   Platelets 150 - 400 K/uL 238  234  312       Latest Ref Rng & Units 08/07/2022    3:00 AM 08/06/2022    4:37 AM 08/06/2022    1:47 AM  BMP  Glucose 70 - 99 mg/dL 96  84  99   BUN 8 - 23 mg/dL 15  14  14    Creatinine 0.44 - 1.00 mg/dL 2.48  2.50  0.37   Sodium 135 - 145 mmol/L 138  142  142   Potassium 3.5 - 5.1 mmol/L 3.7  4.4  4.6   Chloride 98 - 111 mmol/L 111  116  110   CO2 22 - 32 mmol/L 20  20  20    Calcium 8.9 - 10.3 mg/dL 7.8  7.8  8.6    Intake/Output      04/07 0701 04/08 0700 04/08 0701 04/09 0700   P.O. 300    I.V. (mL/kg) 806.1 (19.1)    Blood 0    IV Piggyback 0    Total Intake(mL/kg) 1106.1 (26.3)    Blood 25    Total Output 25    Net +1081.1            Physical Exam: General: NAD.  Laying in bed, calm Resp: No increased wob Cardio: regular rate and rhythm ABD soft Neurologically intact MSK Neurovascularly intact Sensation intact distally Intact pulses distally Dorsiflexion/Plantar flexion intact Incision: dressing C/D/I   Assessment: 1 Day Post-Op  S/P Procedure(s) (LRB): INTRAMEDULLARY (IM) NAIL HIP (Left) by Dr. Jewel Baize. Brianna Walker on 08/06/22  Principal Problem:   Closed left hip fracture, initial encounter Active Problems:   GIST (gastrointestinal stroma  tumor) of distal rectum s/p LAR/ileostomy 06/04/2015   PULMONARY EMBOLISM, HX OF   HTN (hypertension)   Normocytic anemia    Plan:  Advance diet Up with therapy Incentive Spirometry Elevate and Apply ice  Weightbearing: WBAT LLE Insicional and dressing care: Dressings left intact until follow-up and Reinforce dressings as needed Orthopedic device(s): None Showering: Keep dressing dry VTE prophylaxis:  Eliquis resumed  , SCDs, ambulation Pain control: PRN Follow - up plan: 2 weeks Contact information:  Brianna Walker, Brianna Walker  Dispo:  TBD. Expressed desire to go home with [facility] services. IS currently on hospice care.     Brianna Walker, [facility] Office [phone] 08/07/2022, 9:28 AM

## 2022-08-07 NOTE — Progress Notes (Signed)
ANTICOAGULATION CONSULT NOTE - Initial Consult  Pharmacy Consult for Apixaban Indication:  h/o VTE   No Known Allergies  Patient Measurements: Height: 5' 4.02" (162.6 cm) Weight: 42.1 kg (92 lb 13 oz) IBW/kg (Calculated) : 54.74   Vital Signs: Temp: 98.1 F (36.7 C) (04/08 0700) Temp Source: Oral (04/08 0700) BP: 120/82 (04/08 0700) Pulse Rate: 74 (04/08 0311)  Labs: Recent Labs    08/06/22 0147 08/06/22 0437 08/07/22 0300  HGB 8.0* 6.5* 9.7*  HCT 27.4* 22.4* 29.3*  PLT 312 234 238  CREATININE 0.98 0.91 1.05*    Estimated Creatinine Clearance: 31.2 mL/min (A) (by C-G formula based on SCr of 1.05 mg/dL (H)).   Medical History: Past Medical History:  Diagnosis Date   AKI (acute kidney injury) 06/21/2015   ALLERGIC RHINITIS 09/12/2007   Arthritis    BACK PAIN 06/19/2008   Cramp of limb 08/11/2009   DEEP VENOUS THROMBOPHLEBITIS, LEG, RIGHT 03/04/2010   right leg   Diverticulosis    FREQUENCY, URINARY 10/07/2009   GERD 12/05/2006   gist    dx. 4'16 -oral chemotherapy only.   HEMORRHOIDS 11/17/2006   HIP PAIN, RIGHT, CHRONIC 08/11/2009   HX, PERSONAL, TUBERCULOSIS 11/17/2006   HYPERLIPIDEMIA 09/12/2007   Hyperlipidemia 09/12/2007   Qualifier: Diagnosis of  By: Jonny Ruiz MD, Len Blalock    HYPERTENSION 11/17/2006   INSOMNIA-SLEEP DISORDER-UNSPEC 06/19/2008   Iron deficiency anemia 12/07/2011   LEG PAIN, RIGHT 06/19/2008   Long term (current) use of anticoagulants 11/29/2010   MASS, SUPERFICIAL 09/12/2007   MENOPAUSAL DISORDER 09/20/2009   OVERACTIVE BLADDER 03/04/2010   Perforation of colon 11/17/2015   Pneumonia    PULMONARY EMBOLISM, HX OF 11/17/2006   [place] [facility] s/p Pneumonia   RECTAL BLEEDING 10/07/2007   Rectal mass 06/2014   SCIATICA, RIGHT 09/12/2007   Shortness of breath dyspnea    With exertion since chemotherapy tx-oral meds   SKIN LESION 07/20/2009   TMJ SYNDROME 11/17/2006   Tuberculosis    pt. was tx.   Tubular adenoma of colon 2016    Medications:   Medications Prior to Admission  Medication Sig Dispense Refill Last Dose   apixaban (ELIQUIS) 5 MG TABS tablet TAKE 1 TABLET TWICE DAILY 60 tablet 0 08/05/2022 at 2000   estradiol (DOTTI) 0.025 MG/24HR APPLY  1  PATCH TOPICALLY TWICE WEEKLY (Patient taking differently: Place 1 patch onto the skin 2 (two) times a week.) 24 patch 0 Past Week   ferrous sulfate 325 (65 FE) MG tablet Take 1 tablet (325 mg total) by mouth 2 (two) times daily with a meal. (Patient taking differently: Take 325 mg by mouth daily with breakfast.) 180 tablet 3 08/05/2022   HYDROmorphone (DILAUDID) 4 MG tablet Take 2-3 tablets (8-12 mg total) by mouth every 4 (four) hours as needed for severe pain. 100 tablet 0 08/05/2022   magnesium oxide (MAG-OX) 400 (240 Mg) MG tablet TAKE 1 TABLET EVERY DAY (Patient taking differently: Take 400 mg by mouth daily.) 90 tablet 3 08/05/2022   morphine (MS CONTIN) 60 MG 12 hr tablet Take 1 tablet (60 mg total) by mouth every 12 (twelve) hours. 60 tablet 0 08/05/2022   omeprazole (PRILOSEC) 40 MG capsule TAKE 1 CAPSULE EVERY DAY (Patient taking differently: Take 40 mg by mouth daily.) 30 capsule 0 08/05/2022   ondansetron (ZOFRAN) 8 MG tablet TAKE 1 TABLET EVERY 8 (EIGHT) HOURS AS NEEDED FOR NAUSEA OR VOMITING. (Patient taking differently: Take 8 mg by mouth every 8 (eight) hours as  needed for nausea or vomiting. TAKE 1 TABLET EVERY 8 (EIGHT) HOURS AS NEEDED FOR NAUSEA OR VOMITING.) 30 tablet 1 unknown   potassium chloride (MICRO-K) 10 MEQ CR capsule TAKE 2 CAPSULES EVERY DAY (SUBSTITUTED FOR  MICRO-K) (Patient taking differently: Take 20 mEq by mouth daily.) 180 capsule 2 08/05/2022   predniSONE (DELTASONE) 10 MG tablet Take 1 tablet (10 mg total) by mouth daily with breakfast. 90 tablet 3 08/05/2022   Vitamin D, Ergocalciferol, (DRISDOL) 50000 units CAPS capsule Take 1 capsule (50,000 Units total) by mouth every 7 (seven) days. 12 capsule 0 Past Week   atorvastatin (LIPITOR) 40 MG tablet TAKE 1 TABLET EVERY DAY  (Patient not taking: Reported on 08/06/2022) 30 tablet 0 Not Taking   benazepril (LOTENSIN) 40 MG tablet TAKE 1 TABLET EVERY EVENING (Patient not taking: Reported on 08/06/2022) 30 tablet 0 Not Taking   diphenoxylate-atropine (LOMOTIL) 2.5-0.025 MG tablet Take 1-2 tablets by mouth 4 times daily as needed for diarrhea or loose stool (max of 8 tablets daily) (Patient not taking: Reported on 04/12/2022) 60 tablet 0 Not Taking   loperamide (IMODIUM) 2 MG capsule Take 1-2 capsules (2-4 mg total) by mouth every 8 (eight) hours as needed for diarrhea or loose stools (Use if >2 BM every 8 hours). (Patient not taking: Reported on 04/12/2022) 30 capsule 0 Not Taking   ripretinib (QINLOCK) 50 MG tablet Take 3 tablets (150 mg total) by mouth daily. (Patient not taking: Reported on 08/06/2022) 90 tablet 0 Not Taking   solifenacin (VESICARE) 5 MG tablet TAKE 1 TABLET EVERY DAY (Patient not taking: Reported on 04/12/2022) 30 tablet 0 Not Taking    Assessment: 77 y.o female on Apixaban 5mg  bid prior to admission for history ofo DVT / PE.  Admitted 4/7 with left hip fracture after a fall.    Apixaban held for surgery.  Now POD #1 s/p IM nail hip.  Hgb is 9.7 on POD#1.  No bleeding reported.  Okay to resume Apixaban.     Goal of Therapy:  Prevention VTE Monitor platelets by anticoagulation protocol: Yes    Plan:   Resume Apixaban 5mg  BID Monitor for s/sx of bleeding.     Thank you for allowing pharmacy to be part of this patients care team.  Noah Delaine, RPh Clinical Pharmacist 08/07/2022,8:35 AM

## 2022-08-07 NOTE — Progress Notes (Signed)
  Progress Note   Patient: Brianna Walker UJW:[id number] DOB: [birth year] DOA: 08/06/2022     1 DOS: the patient was seen and examined on 08/07/2022        Brief hospital course: Brianna Walker is a 77 y.o. F with metastatic GIST, HTN, and VTE on Eliquis who presented with leg pain after a fall.   4/7: Admitted with hip fracture, taken to the OR     Assessment and Plan: * Closed left hip fracture, initial encounter S/p IM Nail by Dr. [name] on 4/7 - WBAT - Continue home Eliquis for DVT PPx   Normocytic anemia Prsented with Hgb 6.5. Transfused and Hgb post-op up to 9.7 today. 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 - Continue prednisone - Consult Hospice  Hyperlipidemia - Continue atorvastatin          Subjective: Patient's pain fairly well-controlled overnight with oral and IV medicines, no fever, no respiratory distress, no new symptoms, no nursing concerns     Physical Exam: BP 102/72 (BP Location: Left Arm)   Pulse 100   Temp 98.2 F (36.8 C) (Oral)   Resp 14   Ht 5' 4.02" (1.626 m)   Wt 42.1 kg   SpO2 100%   BMI 15.92 kg/m   Thin elderly female, appears chronically ill RRR, no murmurs, no peripheral edema Respiratory rate normal, lungs clear without rales or wheezes Abdomen soft no tenderness palpation or guarding Attention normal, affect pleasant, judgment insight appear normal    Data Reviewed: Basic metabolic panel shows no changes, CBC shows hemoglobin up to 9.7  Family Communication: Daughter at the bedside    Disposition: Status is: Inpatient Likely medically ready on Wednesday for discharge to SNF versus home with hospice following        Author: Alberteen Sam, MD 08/07/2022 5:51  PM  For on call review [email or web address].

## 2022-08-07 NOTE — Assessment & Plan Note (Signed)
-   Continue Eliquis 

## 2022-08-07 NOTE — Progress Notes (Signed)
Inpatient Rehab Admissions Coordinator:   Per PT recommendation, patient was screened for CIR candidacy by Megan Salon, MS, CCC-SLP. At this time, Pt. does not appear to demonstrate medical necessity to justify in hospital rehabilitation/CIR and I do not believe payor would approve a CIR Stay. I will not pursue a rehab consult for this Pt.   Recommend other rehab venues to be pursued.  Please contact me with any questions.  Megan Salon, MS, CCC-SLP Rehab Admissions Coordinator  [phone] (celll) [phone] (office)

## 2022-08-07 NOTE — Assessment & Plan Note (Addendum)
Patient has chronic pain at her lower abdomen where her tumor is, this is severe and worse post-op, probably due to disruption of her home regimen.  I discussed with Palliative here, who deferred to her Hospice MD.  Spoke with her hospice team, and Hospice MD recommended increasing MS Contin from BID to TID and conitnue home oral Dilaudid. - Continue home Dilaudid PO 8-12 mg q4hrs - Increase MS contin 60 mg from BID to TID - Continue prednisone - Appreciate Hospice care

## 2022-08-08 ENCOUNTER — Encounter (HOSPITAL_COMMUNITY): Payer: Self-pay | Admitting: Orthopedic Surgery

## 2022-08-08 DIAGNOSIS — Z515 Encounter for palliative care: Secondary | ICD-10-CM

## 2022-08-08 DIAGNOSIS — S72002A Fracture of unspecified part of neck of left femur, initial encounter for closed fracture: Secondary | ICD-10-CM | POA: Diagnosis not present

## 2022-08-08 LAB — CBC
HCT: 29.5 % — ABNORMAL LOW (ref 36.0–46.0)
Hemoglobin: 9.4 g/dL — ABNORMAL LOW (ref 12.0–15.0)
MCH: 26.6 pg (ref 26.0–34.0)
MCHC: 31.9 g/dL (ref 30.0–36.0)
MCV: 83.3 fL (ref 80.0–100.0)
Platelets: 261 10*3/uL (ref 150–400)
RBC: 3.54 MIL/uL — ABNORMAL LOW (ref 3.87–5.11)
RDW: 19.1 % — ABNORMAL HIGH (ref 11.5–15.5)
WBC: 6.7 10*3/uL (ref 4.0–10.5)
nRBC: 0 % (ref 0.0–0.2)

## 2022-08-08 LAB — BASIC METABOLIC PANEL
Anion gap: 7 (ref 5–15)
BUN: 19 mg/dL (ref 8–23)
CO2: 19 mmol/L — ABNORMAL LOW (ref 22–32)
Calcium: 8 mg/dL — ABNORMAL LOW (ref 8.9–10.3)
Chloride: 110 mmol/L (ref 98–111)
Creatinine, Ser: 1.29 mg/dL — ABNORMAL HIGH (ref 0.44–1.00)
GFR, Estimated: 44 mL/min — ABNORMAL LOW (ref >60)
Glucose, Bld: 102 mg/dL — ABNORMAL HIGH (ref 70–99)
Potassium: 3.8 mmol/L (ref 3.5–5.1)
Sodium: 136 mmol/L (ref 135–145)

## 2022-08-08 MED ORDER — HYDROMORPHONE HCL 2 MG PO TABS
8.0000 mg | ORAL_TABLET | ORAL | Status: DC | PRN
  Administered 2022-08-08 (×2): 8 mg via ORAL
  Administered 2022-08-09: 12 mg via ORAL
  Administered 2022-08-09: 10 mg via ORAL
  Administered 2022-08-09: 12 mg via ORAL
  Filled 2022-08-08: qty 4
  Filled 2022-08-08: qty 6
  Filled 2022-08-08: qty 4
  Filled 2022-08-08: qty 6
  Filled 2022-08-08: qty 5
  Filled 2022-08-08: qty 4

## 2022-08-08 MED ORDER — MORPHINE SULFATE ER 15 MG PO TBCR
60.0000 mg | EXTENDED_RELEASE_TABLET | Freq: Three times a day (TID) | ORAL | Status: DC
  Administered 2022-08-08 – 2022-08-09 (×3): 60 mg via ORAL
  Filled 2022-08-08 (×3): qty 4

## 2022-08-08 MED ORDER — HYDROMORPHONE HCL 1 MG/ML IJ SOLN
0.5000 mg | INTRAMUSCULAR | Status: DC | PRN
  Administered 2022-08-08 – 2022-08-09 (×3): 1 mg via INTRAVENOUS
  Filled 2022-08-08 (×3): qty 1

## 2022-08-08 NOTE — Progress Notes (Signed)
[facility] 5N18 [facility]) hospitalized hospice patient visit   Brianna Walker is a current [facility] hospice patient with a terminal diagnosis of Rectal Cancer. She had a fall at home and was transported to the ED for evaluation. She was admitted to the hospital on 4.7.24 with a diagnosis of femur fracture and underwent IM nailing. Per Dr. Patric Dykes with [facility] this is a related hospital admission.    Visited with Brianna Walker at bedside, her sister Brianna Walker is present as well. Brianna Walker reports her pain seems worse today and they don't seem to be giving her the medication she needs. Exchanged report with medical team as well as recommendations from hospice MD about pain medications. She remains inpatient appropriate at this time due to need for skilled aftercare following surgical intervention.    Vital Signs- 97.7/95/18   120/87    100% room air Intake/Output- 740/25 Abnormal labs- CO2 19, Creatinine 1.29, Ca+ 8, GFR 44, RBC 3.54, Hgb 9.4, Hct 29.5 Diagnostics- None new IV/PRN Meds- Robaxin 500mg  PO x3, Dilaudid 1mg  IV x3, Dilaudid 8mg  PO x2, Oxycodone 10mg  po x4   Problem List-  Closed left hip fracture, initial encounter S/p IM Nail by Dr. [name] on 4/7 - WBAT - Continue home Eliquis for DVT PPx     Normocytic anemia Prsented with Hgb 6.5. 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   I discussed with Palliative here, who deferred to her Hospice MD.  Spoke with her hospice team, and Hospice MD recommended increasing MS Contin  from BID to TID and conitnue home oral Dilaudid. - Continue home Dilaudid PO 8-12 mg q4hrs - Increase MS contin 60 mg from BID to TID - Continue prednisone - Appreciate Hospice care      Discharge Planning- Ongoing, likely back to home once stable Family Contact- Talked with patient and sister in room IDT- Updated ACC team on current patient condition Goals of care - Clear DNR; patient reiterated hospice support with discharge.  Please utilize [facility] for discharge as we contract with them for our patients.  Thank you  [name], BSN, RN Hospice hospital liaison  [phone]

## 2022-08-08 NOTE — TOC Initial Note (Signed)
Transition of Care [facility](TOC) - Initial/Assessment Note    Patient Details  Name: Brianna Walker MRN: [id number] Date of Birth: [birth year]  Transition of Care [facility](TOC) CM/SW Contact:    Lawerance Sabalebbie Reginna Sermeno, RN Phone Number: 08/08/2022, 11:15 AM  Clinical Narrative:            Spoke w patient and sister at bedside to discuss DC plans.  First preference would be CIR, however patient is not currently at a level of activity tolerance that would support CIR admission.  Patient and family state that they would want to return home.  Records indicate that they are active with [facility] for home hospice services.  Explained that they would not have home health services set up since they are active w hospice, however therapy her could provide them with a home exercise plan to practice.  Sister states that she is the caregiver for the patient and will continue to provide 24/7 support to her after DC.  Patient has a RW and a shower seat at home, showed them 3/1 from hospital bathroom and they agreed one would be beneficial for home. [facility] is ordering 3/1 to be delivered to the home.   Patient will require [facility] ([facility] for transport home.          Expected Discharge Plan: Home w Hospice Care Barriers to Discharge: Continued Medical Work up   Patient Goals and CMS Choice Patient states their goals for this hospitalization and ongoing recovery are:: return home (if CIR not avaiable)          Expected Discharge Plan and Services   Discharge Planning Services: CM Consult   Living arrangements for the past 2 months: Single Family Home                                      Prior Living Arrangements/Services Living arrangements for the past 2 months: Single Family Home Lives with:: Relatives                   Activities of Daily Living      Permission Sought/Granted                  Emotional Assessment              Admission  diagnosis:  Closed displaced intertrochanteric fracture of left femur, initial encounter [S72.142A] Closed left hip fracture, initial encounter [S72.002A] Patient Active Problem List   Diagnosis Date Noted   Pressure injury of skin 08/07/2022   Closed left hip fracture, initial encounter 08/06/2022   Normocytic anemia 08/06/2022   HTN (hypertension) 02/28/2021   Diarrhea 02/28/2021   Candida infection of flexural skin 09/09/2020   Coronary artery calcification seen on CT scan 06/14/2020   Aortic atherosclerosis 06/14/2020   OAB (overactive bladder) 06/14/2020   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 12/11/2018   Left shoulder pain 04/16/2017   Hyperglycemia 04/16/2017   Encounter for well adult exam with abnormal findings 10/05/2016   Thyroid nodule, cold 03/10/2016   Colorectal anastomotic stricture s/p dilation  01/28/2016   Ileostomy takedown 01/28/2016 01/28/2016   Toxic multinodular goiter w/o crisis 11/18/2015   Emesis    Delayed gastric emptying 06/28/2015   Obesity (BMI 30-39.9) 06/28/2015   Cystic mass of anterior abdominal wall s/p excision 01/27/2016 06/28/2015   Dehydration    Hypokalemia 06/23/2015  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 [place] [address] Phone: [phone] Fax: [phone]  [facility] - [place], [place] - [address] [place] [address] Phone: [phone] Fax: [phone]  [id number] [place], [place] - [address] [facility]  [address] [facility] [address] [place] [address] Phone: [phone] Fax: [phone]     Social Determinants of Health (SDOH) Social History: SDOH Screenings   Transportation Needs: No Transportation Needs (12/11/2018)  Depression (PHQ2-9): Low Risk  (06/14/2020)  Tobacco Use: Medium Risk (08/06/2022)   SDOH Interventions:     Readmission Risk Interventions     No data to display

## 2022-08-08 NOTE — Progress Notes (Signed)
  Progress Note   Patient: Brianna [place] TDS:[id number] DOB: 02-17-1946 DOA: 08/06/2022     2 DOS: the patient was seen and examined on 08/08/2022        Brief hospital course: [name] is a 77 y.o. F with metastatic GIST, HTN, and VTE on Eliquis who presented with leg pain after a fall.   4/7: Admitted with hip fracture, taken to the OR     Assessment and Plan: * Closed left hip fracture, initial encounter S/p IM Nail by Dr. [name] on 4/7 - WBAT - Continue home Eliquis for DVT PPx   Normocytic anemia Prsented with Hgb 6.5. 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  I discussed with Palliative here, who deferred to her Hospice MD.  Spoke with her hospice team, and Hospice MD recommended increasing MS Contin from BID to TID and conitnue home oral Dilaudid. - Continue home Dilaudid PO 8-12 mg q4hrs - Increase MS contin 60 mg from BID to TID - Continue prednisone - Appreciate Hospice care    Hyperlipidemia - Continue atorvastatin          Subjective: Patient with severe pain, can't get comfortable, pain mostly attributable to her pelvic mass.  No fever, respiratory symptoms.     Physical Exam: BP 120/87 (BP Location: Left Arm)   Pulse 100   Temp 97.7 F (36.5 C) (Oral)   Resp 18   Ht 5' 4.02" (1.626 m)   Wt 42.1 kg   SpO2 100%   BMI 15.92 kg/m   Thin adult female, lying in bed, no acute distress RRR no murmurs, no LE edema Respiratory normal, lungs clear Abdomen has RLQ mass, pain is centered  here although it is not particularly gaurded or anything Mentation good  Data Reviewed: Cr slightly up to 1.2, Hgb stable  Family Communication: Sister at bedside    Disposition: Status is: Inpatient Plan is for discharge to home with equipment and hospice following.  Given her unctonrolled pain, I will titrate morphine today, and plan for discharge tomorrow if Cr good and pain reasonably well controlled        Author: Alberteen Sam, MD 08/08/2022 3:58 PM  For on call review [email or web address].

## 2022-08-08 NOTE — Progress Notes (Signed)
Patient called for pain medication at the front desk.  This nurse gave patient 8 mg Dilaudid PO at 2022. Less than 20 mins patient is calling requesting more pain medicine. This nurse explained to patient and her sister to at least give the 8 mg of Dilaudid a chance to work.  Give at least an hour.  Patient became irate and stated "look I don't need you to do this, get somebody else in here!"  Charge nurse informed and went to speak with patient and her sister.  Patient placed with another nurse as per her request.

## 2022-08-08 NOTE — Progress Notes (Signed)
Subjective: Patient reports pain as marked. Waiting on medicine from RN. Tolerating diet.  Urinating.   No CP, SOB.  Has mobilized very little OOB with PT so far. Needs to work on stairs in order to go home.  Objective:   VITALS:   Vitals:   08/07/22 1523 08/07/22 2310 08/08/22 0438 08/08/22 0747  BP: 102/72 130/76 100/70 (!) 123/91  Pulse: 100 100 92 100  Resp: 14 15 16 17   Temp: 98.2 F (36.8 C) 98 F (36.7 C) 98.3 F (36.8 C) 97.9 F (36.6 C)  TempSrc: Oral Oral Oral Oral  SpO2: 100% 100% 100% 100%  Weight:      Height:          Latest Ref Rng & Units 08/08/2022    3:12 AM 08/07/2022    3:00 AM 08/06/2022    4:37 AM  CBC  WBC 4.0 - 10.5 K/uL 6.7  7.5  5.8   Hemoglobin 12.0 - 15.0 g/dL 9.4  9.7  6.5   Hematocrit 36.0 - 46.0 % 29.5  29.3  22.4   Platelets 150 - 400 K/uL 261  238  234       Latest Ref Rng & Units 08/08/2022    3:12 AM 08/07/2022    3:00 AM 08/06/2022    4:37 AM  BMP  Glucose 70 - 99 mg/dL 032  96  84   BUN 8 - 23 mg/dL 19  15  14    Creatinine 0.44 - 1.00 mg/dL 1.22  4.82  5.00   Sodium 135 - 145 mmol/L 136  138  142   Potassium 3.5 - 5.1 mmol/L 3.8  3.7  4.4   Chloride 98 - 111 mmol/L 110  111  116   CO2 22 - 32 mmol/L 19  20  20    Calcium 8.9 - 10.3 mg/dL 8.0  7.8  7.8    Intake/Output      04/08 0701 04/09 0700 04/09 0701 04/10 0700   P.O. 480    I.V. (mL/kg)     Blood     IV Piggyback     Total Intake(mL/kg) 480 (11.4)    Blood     Total Output     Net +480         Urine Occurrence 3 x    Stool Occurrence 1 x       Physical Exam: General: NAD.  Laying in bed, calm Resp: No increased wob Cardio: regular rate and rhythm ABD soft Neurologically intact MSK Neurovascularly intact Sensation intact distally Intact pulses distally Dorsiflexion/Plantar flexion intact Incision: dressing C/D/I   Assessment: 2 Days Post-Op  S/P Procedure(s) (LRB): INTRAMEDULLARY (IM) NAIL HIP (Left) by Dr. Jewel Baize. 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   PULMONARY EMBOLISM, HX OF   HTN (hypertension)   Normocytic anemia   Pressure injury of skin    Plan:  Advance diet Up with therapy Incentive Spirometry Elevate and Apply ice  Weightbearing: WBAT LLE Insicional and dressing care: Dressings left intact until follow-up and Reinforce dressings as needed Orthopedic device(s): None Showering: Keep dressing dry VTE prophylaxis:  Eliquis resumed  , SCDs, ambulation Pain control: PRN Follow - up plan: 2 weeks Contact information:  Margarita Rana MD, Levester Fresh PA-C  Dispo:  home with [facility] services. Is currently on hospice care. Ok for  d/c from ortho standpoint once medically ready.     Brianna Pane, PA-C Office [phone] 08/08/2022, 8:06 AM

## 2022-08-08 NOTE — Progress Notes (Signed)
   Palliative Medicine Inpatient Follow Up Note   The PMT has acknowledged the consultation for [facility].   Per chart review she is actively enrolled in OP Hospice with [facility].  Admitted on 4/7 s/p fall w/ L Hip Fx. Ortho consulted s/p L hip IM nail.   Symptom management per [facility] --> Have messaged [facility] liaison, Misty Green and primary hospitalist, Dr. Maryfrances Bunnell in an effort to help increase communication in regards to an oral pain regiment.  Should additional support be needed please do not hesitate to reach out to the PMT.   No Charge ______________________________________________________________________________________ Lamarr Lulas [place] Palliative Medicine Team Team Cell Phone: [phone] Please utilize secure chat with additional questions, if there is no response within 30 minutes please call the above phone number  Palliative Medicine Team providers are available by phone from 7am to 7pm daily and can be reached through the team cell phone.  Should this patient require assistance outside of these hours, please call the patient's attending physician.

## 2022-08-08 NOTE — Progress Notes (Signed)
Physical Therapy Treatment Patient Details Name: Brianna Walker MRN: [id number] DOB: [birth year] Today's Date: 08/08/2022   History of Present Illness Pt is a 77 y/o F s/p intramedullary nail L hip after a fall resulting in L hip fx. PMH includes GIST of rectum, hypertension, anemia, and hx DVT/PE.    PT Comments    Pt was received in supine and agreeable to session with encouragement. Pt was able to demo BLE exercises with cues for technique and assist with LLE due to pain. Pt was agreeable to sit EOB, requiring min A for trunk and LLE, but deferred OOB mobility due to pain and fatigue. Pt was noted to have small BM upon sitting, however pt declined use of BSC and requested to return to supine for pericare. Pt able to roll with min A and use of bedrails. Pt continues to benefit from PT services to progress toward functional mobility goals.      Recommendations for follow up therapy are one component of a multi-disciplinary discharge planning process, led by the attending physician.  Recommendations may be updated based on patient status, additional functional criteria and insurance authorization.  Follow Up Recommendations       Assistance Recommended at Discharge Frequent or constant Supervision/Assistance  Patient can return home with the following A lot of help with walking and/or transfers;A lot of help with bathing/dressing/bathroom;Assistance with cooking/housework;Direct supervision/assist for medications management;Direct supervision/assist for financial management;Assist for transportation;Help with stairs or ramp for entrance   Equipment Recommendations  Other (comment)    Recommendations for Other Services       Precautions / Restrictions Precautions Precautions: Fall Restrictions Weight Bearing Restrictions: No LLE Weight Bearing: Weight bearing as tolerated     Mobility  Bed Mobility Overal bed mobility: Needs Assistance Bed Mobility: Rolling, Supine to Sit, Sit  to Supine Rolling: Min assist   Supine to sit: Mod assist, HOB elevated Sit to supine: Min assist   General bed mobility comments: Increased time and use of bedrails. Mod A for trunk elevation, LLE elevation for pt to advance to EOB, and scooting forward at EOB. Min A for LLE elevation to EOB to return to supine. Min A to roll to the L for pericare.    Transfers                   General transfer comment: Pt deferred OOB mobility         Balance Overall balance assessment: Needs assistance Sitting-balance support: Bilateral upper extremity supported, Feet supported Sitting balance-Leahy Scale: Fair                                      Cognition Arousal/Alertness: Awake/alert Behavior During Therapy: WFL for tasks assessed/performed Overall Cognitive Status: Within Functional Limits for tasks assessed                                          Exercises General Exercises - Lower Extremity Ankle Circles/Pumps: AROM, Both, Supine, 10 reps Quad Sets: AROM, Supine, Both, 5 reps Heel Slides: AROM, Supine, Both, 5 reps, AAROM    General Comments General comments (skin integrity, edema, etc.): Pt's sister present throughout. Pt noted to have small BM upon sitting and pt requested to return to supine for pericare.      Pertinent Vitals/Pain  Pain Assessment Pain Assessment: 0-10 Pain Score: 7  Pain Location: L hip Pain Descriptors / Indicators: Aching, Discomfort, Grimacing, Operative site guarding, Sore Pain Intervention(s): Limited activity within patient's tolerance, Monitored during session, Repositioned     PT Goals (current goals can now be found in the care plan section) Acute Rehab PT Goals Patient Stated Goal: to return home PT Goal Formulation: With patient/family Time For Goal Achievement: 08/14/22 Potential to Achieve Goals: Good Progress towards PT goals: Not progressing toward goals - comment (limited by pain)     Frequency    Min 5X/week      PT Plan Current plan remains appropriate       AM-PAC PT "6 Clicks" Mobility   Outcome Measure  Help needed turning from your back to your side while in a flat bed without using bedrails?: A Little Help needed moving from lying on your back to sitting on the side of a flat bed without using bedrails?: A Lot Help needed moving to and from a bed to a chair (including a wheelchair)?: A Lot Help needed standing up from a chair using your arms (e.g., wheelchair or bedside chair)?: A Lot Help needed to walk in hospital room?: Total Help needed climbing 3-5 steps with a railing? : Total 6 Click Score: 11    End of Session   Activity Tolerance: Patient limited by pain;Patient limited by fatigue Patient left: in bed;with call bell/phone within reach;with bed alarm set;with family/visitor present Nurse Communication: Mobility status PT Visit Diagnosis: Unsteadiness on feet (R26.81);Other abnormalities of gait and mobility (R26.89);Muscle weakness (generalized) (M62.81);Pain     Time: [id number] PT Time Calculation (min) (ACUTE ONLY): 25 min  Charges:  $Therapeutic Exercise: 8-22 mins $Therapeutic Activity: 8-22 mins                     Johny ShockHaley Kenwood Rosiak, PTA Acute Rehabilitation Services Secure Chat Preferred  [phone]    Johny ShockHaley  Janene Yousuf 08/08/2022, 9:22 AM

## 2022-08-09 DIAGNOSIS — D649 Anemia, unspecified: Secondary | ICD-10-CM

## 2022-08-09 DIAGNOSIS — E7849 Other hyperlipidemia: Secondary | ICD-10-CM

## 2022-08-09 DIAGNOSIS — C49A4 Gastrointestinal stromal tumor of large intestine: Secondary | ICD-10-CM

## 2022-08-09 DIAGNOSIS — S72002A Fracture of unspecified part of neck of left femur, initial encounter for closed fracture: Secondary | ICD-10-CM

## 2022-08-09 DIAGNOSIS — Z86718 Personal history of other venous thrombosis and embolism: Secondary | ICD-10-CM | POA: Diagnosis not present

## 2022-08-09 DIAGNOSIS — I10 Essential (primary) hypertension: Secondary | ICD-10-CM

## 2022-08-09 MED ORDER — ALUM & MAG HYDROXIDE-SIMETH 200-200-20 MG/5ML PO SUSP: 30.0000 mL | ORAL | 0 refills | Status: DC | PRN

## 2022-08-09 MED ORDER — DOCUSATE SODIUM 100 MG PO CAPS: 100.0000 mg | ORAL_CAPSULE | Freq: Two times a day (BID) | ORAL | 0 refills | Status: DC

## 2022-08-09 MED ORDER — ACETAMINOPHEN 500 MG PO TABS: 1000.0000 mg | ORAL_TABLET | Freq: Three times a day (TID) | ORAL | 0 refills | Status: DC

## 2022-08-09 MED ORDER — POLYETHYLENE GLYCOL 3350 17 G PO PACK: 17.0000 g | PACK | Freq: Every day | ORAL | 0 refills | Status: DC | PRN

## 2022-08-09 MED ORDER — METHOCARBAMOL 500 MG PO TABS: 500.0000 mg | ORAL_TABLET | Freq: Four times a day (QID) | ORAL | 0 refills | Status: AC | PRN

## 2022-08-09 NOTE — TOC Transition Note (Signed)
Transition of Care [facility]) - CM/SW Discharge Note   Patient Details  Name: Brianna Walker MRN: [id number] Date of Birth: April 13, 1946  [facility] CM/SW Contact:  Epifanio Lesches, RN Phone Number: 08/09/2022, 10:39 AM   Clinical Narrative:    Patient will DC to: Home Anticipated DC date:08/09/2022 Family notified: yes Transport by: [facility]   Per MD patient ready for DC today . RN, patient, patient's family, and [facility] notified of DC. Transportation papers on front of chart. DC packet on chart.[facility]ulance [phone]) transport requested for patient.    RNCM will sign off for now as intervention is no longer needed. Please consult [place] again if new needs arise.    Final next level of care: Home w Hospice Care Barriers to Discharge: No Barriers Identified   Patient Goals and CMS Choice      Discharge Placement                         Discharge Plan and Services Additional resources added to the After Visit Summary for     Discharge Planning Services: CM Consult                                 Social Determinants of Health (SDOH) Interventions SDOH Screenings   Transportation Needs: No Transportation Needs (12/11/2018)  Depression (PHQ2-9): Low Risk  (06/14/2020)  Tobacco Use: Medium Risk (08/08/2022)     Readmission Risk Interventions     No data to display

## 2022-08-09 NOTE — Progress Notes (Signed)
Pt discharged to home. Dc instructions done by Corena Herter RN. See nurse's note for details. Pt left unit on stretcher pushed by EMS. Left in stable condition.

## 2022-08-09 NOTE — Progress Notes (Signed)
Physical Therapy Treatment Patient Details Name: Brianna Walker MRN: [id number] DOB: 1945/10/13 Today's Date: 08/09/2022   History of Present Illness Pt is a 77 y/o F s/p intramedullary nail L hip after a fall resulting in L hip fx. PMH includes GIST of rectum, hypertension, anemia, and hx DVT/PE.    PT Comments    Pt was received in supine on bedpan with sister present. Pt and sister expressed frustrations of being left on the bedpan for ~1 hour per pt report. Pt required min A to roll for pericare. Pt able to sit EOB requiring BUE support to maintain balance. Pt able to stand from elevated EOB with mod A for power up and required min A to remain standing. Pt unable to bring hips forward to achieve a full upright posture before needing to sit due to fatigue. Pt noted to have small BM upon standing and pt requesting to return to supine to use the bedpan. After discussion with supervising PT, Gwendolyn Grant., discharge recommendations have been updated to reflect pt's plan for continued home hospice care at discharge. Pt continues to benefit from PT services to progress toward functional mobility goals.     Recommendations for follow up therapy are one component of a multi-disciplinary discharge planning process, led by the attending physician.  Recommendations may be updated based on patient status, additional functional criteria and insurance authorization.  Follow Up Recommendations       Assistance Recommended at Discharge Frequent or constant Supervision/Assistance  Patient can return home with the following A lot of help with walking and/or transfers;A lot of help with bathing/dressing/bathroom;Assistance with cooking/housework;Direct supervision/assist for medications management;Direct supervision/assist for financial management;Assist for transportation;Help with stairs or ramp for entrance   Equipment Recommendations  None recommended by PT    Recommendations for Other Services        Precautions / Restrictions Precautions Precautions: Fall Restrictions Weight Bearing Restrictions: No LLE Weight Bearing: Weight bearing as tolerated     Mobility  Bed Mobility Overal bed mobility: Needs Assistance Bed Mobility: Rolling, Supine to Sit, Sit to Supine Rolling: Min assist   Supine to sit: Min assist Sit to supine: Mod assist   General bed mobility comments: increased time and use of bedrails. Assist for LLE advancement to EOB and BLE elevation to EOB to return to supine.    Transfers Overall transfer level: Needs assistance Equipment used: Rolling walker (2 wheels) Transfers: Sit to/from Stand Sit to Stand: Mod assist           General transfer comment: Pt able to stand at EOB with mod A for power up and cues for hand and LLE placement. Pt unable to reach full upright posture before needing to sit due to fatigue.    Ambulation/Gait               General Gait Details: unable       Balance Overall balance assessment: Needs assistance Sitting-balance support: Bilateral upper extremity supported, Feet supported Sitting balance-Leahy Scale: Fair Sitting balance - Comments: R lateral lean without UE support.   Standing balance support: Bilateral upper extremity supported, Reliant on assistive device for balance Standing balance-Leahy Scale: Poor Standing balance comment: required assist to remain standing                            Cognition Arousal/Alertness: Awake/alert Behavior During Therapy: WFL for tasks assessed/performed Overall Cognitive Status: Within Functional Limits for tasks assessed  Exercises      General Comments General comments (skin integrity, edema, etc.): Pt sister present throughout. Pt noted to have small BM upon standing and pt requested to return to supine to use the bedpan      Pertinent Vitals/Pain Pain Assessment Pain Assessment:  0-10 Pain Score: 6  Pain Location: L hip Pain Descriptors / Indicators: Aching, Discomfort, Grimacing, Operative site guarding, Sore Pain Intervention(s): Limited activity within patient's tolerance, Monitored during session     PT Goals (current goals can now be found in the care plan section) Acute Rehab PT Goals Patient Stated Goal: to return home PT Goal Formulation: With patient/family Time For Goal Achievement: 08/14/22 Potential to Achieve Goals: Good Progress towards PT goals: Not progressing toward goals - comment (limited by pain and fatigue)    Frequency    Min 5X/week      PT Plan Discharge plan needs to be updated       AM-PAC PT "6 Clicks" Mobility   Outcome Measure  Help needed turning from your back to your side while in a flat bed without using bedrails?: A Little Help needed moving from lying on your back to sitting on the side of a flat bed without using bedrails?: A Little Help needed moving to and from a bed to a chair (including a wheelchair)?: Total Help needed standing up from a chair using your arms (e.g., wheelchair or bedside chair)?: A Lot Help needed to walk in hospital room?: Total Help needed climbing 3-5 steps with a railing? : Total 6 Click Score: 11    End of Session Equipment Utilized During Treatment: Gait belt Activity Tolerance: Patient limited by pain;Patient limited by fatigue Patient left: in bed;with call bell/phone within reach;with family/visitor present Nurse Communication: Mobility status;Other (comment) (Pt left on bedpan) PT Visit Diagnosis: Unsteadiness on feet (R26.81);Other abnormalities of gait and mobility (R26.89);Muscle weakness (generalized) (M62.81);Pain     Time: [id number] PT Time Calculation (min) (ACUTE ONLY): 23 min  Charges:  $Therapeutic Activity: 23-37 mins                     Johny ShockHaley Jenai Scaletta, PTA Acute Rehabilitation Services Secure Chat Preferred  [phone]    Johny ShockHaley   Keenon Leitzel 08/09/2022, 10:11 AM

## 2022-08-09 NOTE — Care Management Important Message (Signed)
Important Message  Patient Details  Name: Brianna Walker MRN: [id number] Date of Birth: [birth year]   Medicare Important Message Given:  Yes     Sherilyn Banker 08/09/2022, 12:39 PM

## 2022-08-09 NOTE — Discharge Summary (Signed)
Physician Discharge Summary  Brianna Walker [place] JXB:[id number] DOB: 16-Jan-1946 DOA: 08/06/2022  PCP: Corwin Levins, MD  Admit date: 08/06/2022 Discharge date: 08/09/2022  Admitted From: Home  Discharge disposition: Home with hospice  Recommendations for Outpatient Follow-Up:   Follow up with hospice care provider Follow-up with Dr. [name] orthopedics in 2 weeks. Recommend changing MS Contin twice daily to 3 times daily for adequate pain relief and continuation of Dilaudid from home.  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  Diet recommendation:   Regular.  Wound care: None.  Code status: Full.   History of Present Illness:   Mrs. Brianna Walker is a 77 years old female with metastatic GIST, hypertension, VTE on Eliquis presented to hospital after leg fall.  She was noted to have a left hip fracture and was admitted hospital for further evaluation and treatment. Hospital Course:   Following conditions were addressed during hospitalization as listed below,  Closed left hip fracture, initial encounter S/p IM Nail by Dr. [name] on 4/7 recommendation is weightbearing as tolerated.  Continue home Eliquis on discharge for DVT prophylaxis.  Follow-up recommended with orthopedics in 2 weeks.   Normocytic anemia Initial Hgb 6.5. Transfused and Hgb post-op up to 9.4, stable today,.  History of GIST tumor.  Pressure injury of skin Coccyx, stage III, present on admission Continue supportive care care.   HTN (hypertension) On benazepril at home.   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  Hospice MD recommended MS Contin twice daily to 3 times daily with continuation of oral Dilaudid on discharge.Continue prednisone   Hyperlipidemia continue atorvastatin    Disposition.  At this time, patient is stable for disposition to hospice at home with outpatient orthopedics follow-up.  Medical Consultants:   Orthopedics  Procedures:    S/p IM Nail by Dr. [name] on 4/7  Subjective:   Today, patient was seen and examined at bedside.  Complains of mild pain.  Family at bedside.  No nausea vomiting fever chills   Discharge Exam:   Vitals:   08/09/22 0800 08/09/22 0850  BP: 105/83 118/81  Pulse: (!) 139 (!) 124  Resp: 18   Temp: 98.6 F (37 C) 98 F (36.7 C)  SpO2: 100% 100%   Vitals:   08/08/22 1319 08/08/22 2051 08/09/22 0800 08/09/22 0850  BP: 120/87 115/81 105/83 118/81  Pulse:  (!) 109 (!) 139 (!) 124  Resp: Temp: 97.7 F (36.5 C) 98.4 F (36.9 C) 98.6 F (37 C) 98 F (36.7 C)  TempSrc: Oral Oral Oral Oral  SpO2: 100% 100% 100% 100%  Weight:      Height:        General: Alert awake, not in obvious distress, thinly built, HENT: pupils equally reacting to light,  No scleral pallor or icterus noted. Oral mucosa is moist.  Chest:  Clear breath sounds.  Diminished breath sounds bilaterally. No crackles or wheezes.  CVS: S1 &S2 heard. No murmur.  Regular rate and rhythm. Abdomen: Soft, nontender, nondistended.  Bowel sounds are heard. Nonspecific tenderness. Extremities: No cyanosis, clubbing or edema.  Peripheral pulses  are palpable. Psych: Alert, awake and Communicative. CNS:  No cranial nerve deficits.  Moves all extremities. Skin: Warm and dry.  No rashes noted.  The results of significant diagnostics from this hospitalization (including imaging, microbiology, ancillary and laboratory) are listed below for reference.     Diagnostic Studies:   DG FEMUR MIN 2 VIEWS LEFT  Result Date: 08/06/2022 CLINICAL DATA:  Elective  surgery. EXAM: LEFT FEMUR 2 VIEWS COMPARISON:  Preoperative radiograph FINDINGS: Four fluoroscopic spot views of the left proximal femur obtained in the operating room in frontal and lateral projection. Intramedullary nail with trans trochanteric and distal locking screw fixation of intertrochanteric femur fracture. Fluoroscopy time 49 seconds. Dose 5.28 mGy. IMPRESSION: Fluoroscopic spot views during left femoral ORIF. Electronically Signed   By: [name] M.D.   On: 08/06/2022 15:51   DG C-Arm 1-60 Min-No Report  Result Date: 08/06/2022 Fluoroscopy was utilized by the requesting physician.  No radiographic interpretation.   DG CHEST PORT 1 VIEW  Result Date: 08/06/2022 CLINICAL DATA:  77 year old female history of left hip fracture. Preoperative study. EXAM: PORTABLE CHEST 1 VIEW COMPARISON:  Chest x-ray 06/28/2015. FINDINGS: Extensive skin fold artifact projecting over the right hemithorax. Lung volumes are normal. No consolidative airspace disease. No pleural effusions. No pneumothorax. No pulmonary nodule or mass noted. Pulmonary vasculature and the cardiomediastinal silhouette are within normal limits. Atherosclerosis in the thoracic aorta. IMPRESSION: 1.  No radiographic evidence of acute cardiopulmonary disease. 2. Aortic atherosclerosis. Electronically Signed   By: [name] M.D.   On: 08/06/2022 05:22   DG Tibia/Fibula Left  Result Date: 08/06/2022 CLINICAL DATA:  Status post fall. EXAM: LEFT TIBIA AND FIBULA - 2 VIEW COMPARISON:  None Available. FINDINGS: There is no evidence of fracture or other focal bone lesions. Soft tissues are unremarkable. IMPRESSION: Negative. Electronically Signed   By: [name] M.D.   On: 08/06/2022 02:44   DG Femur Min 2 Views Left  Result Date: 08/06/2022 CLINICAL DATA:  Status post fall. EXAM: LEFT FEMUR 2 VIEWS COMPARISON:  None Available. FINDINGS: An acute, mildly displaced fracture deformity is seen extending through the inter  trochanteric region of the proximal left femur. There is no evidence of dislocation. Moderate to marked severity degenerative changes are seen involving the left hip, in the form of joint space narrowing and acetabular sclerosis. A benign-appearing sclerotic focus is seen within the left iliac bone, just above the level of the left acetabulum. Moderate severity vascular calcification is seen. Soft tissues are otherwise unremarkable. IMPRESSION: Acute, mildly displaced fracture of the proximal left femur. Electronically Signed   By: [name] M.D.   On: 08/06/2022 02:43   DG Pelvis 1-2 Views  Result Date: 08/06/2022 CLINICAL DATA:  Status post fall. EXAM: PELVIS - 1-2 VIEW COMPARISON:  None Available. FINDINGS: A total right hip replacement is seen. There is no evidence of surrounding lucency to suggest the presence of hardware loosening or infection. An acute fracture deformity is seen extending through the inter trochanteric region of the proximal left femur. There is no evidence of dislocation. Moderate to marked severity degenerative changes are seen involving the left hip, in the form of joint space narrowing and acetabular sclerosis. IMPRESSION: 1. Acute fracture of the proximal left femur. 2. Intact total right hip replacement. Electronically Signed   By: [name] M.D.   On: 08/06/2022 02:39     Labs:   Basic Metabolic Panel: Recent Labs  Lab 08/06/22 0147 08/06/22 0437 08/07/22 0300 08/08/22  0312  NA 142 142 138 136  K 4.6 4.4 3.7 3.8  CL 110 116* 111 110  CO2 20* 20* 20* 19*  GLUCOSE 99 84 96 102*  BUN 14 14 15 19   CREATININE 0.98 0.91 1.05* 1.29*  CALCIUM 8.6* 7.8* 7.8* 8.0*   GFR Estimated Creatinine Clearance: 25.4 mL/min (A) (by C-G formula based on SCr of 1.29 mg/dL (H)). Liver Function Tests: Recent Labs  Lab 08/06/22 0147  AST 28  ALT 16  ALKPHOS 77  BILITOT 0.7  PROT 5.5*  ALBUMIN 2.2*   No results for input(s): "LIPASE", "AMYLASE" in the last  168 hours. No results for input(s): "AMMONIA" in the last 168 hours. Coagulation profile No results for input(s): "INR", "PROTIME" in the last 168 hours.  CBC: Recent Labs  Lab 08/06/22 0147 08/06/22 0437 08/07/22 0300 08/08/22 0312  WBC 8.6 5.8 7.5 6.7  HGB 8.0* 6.5* 9.7* 9.4*  HCT 27.4* 22.4* 29.3* 29.5*  MCV 83.0 83.6 81.2 83.3  PLT 312 234 238 261   Cardiac Enzymes: No results for input(s): "CKTOTAL", "CKMB", "CKMBINDEX", "TROPONINI" in the last 168 hours. BNP: Invalid input(s): "POCBNP" CBG: No results for input(s): "GLUCAP" in the last 168 hours. D-Dimer No results for input(s): "DDIMER" in the last 72 hours. Hgb A1c No results for input(s): "HGBA1C" in the last 72 hours. Lipid Profile No results for input(s): "CHOL", "HDL", "LDLCALC", "TRIG", "CHOLHDL", "LDLDIRECT" in the last 72 hours. Thyroid function studies No results for input(s): "TSH", "T4TOTAL", "T3FREE", "THYROIDAB" in the last 72 hours.  Invalid input(s): "FREET3" Anemia work up No results for input(s): "VITAMINB12", "FOLATE", "FERRITIN", "TIBC", "IRON", "RETICCTPCT" in the last 72 hours. Microbiology Recent Results (from the past 240 hour(s))  Surgical pcr screen     Status: None   Collection Time: 08/06/22  1:35 PM   Specimen: Nasal Mucosa; Nasal Swab  Result Value Ref Range Status   MRSA, PCR NEGATIVE NEGATIVE Final   Staphylococcus aureus NEGATIVE NEGATIVE Final    Comment: (NOTE) The Xpert SA Assay (FDA approved for NASAL specimens in patients 75 years of age and older), is one component of a comprehensive surveillance program. It is not intended to diagnose infection nor to guide or monitor treatment. Performed at [facility], [address] [place], [place] [address]      Discharge Instructions:   Discharge Instructions     Diet - low sodium heart healthy   Complete by: As directed    Discharge instructions   Complete by: As directed    Follow-up with your hospice care  provider as outpatient.  Follow-up with orthopedics Dr. Eulah PontMurphy in 2 weeks.   Increase activity slowly   Complete by: As directed    No wound care   Complete by: As directed       Allergies as of 08/09/2022   No Known Allergies      Medication List     STOP taking these medications    atorvastatin 40 MG tablet Commonly known as: LIPITOR   benazepril 40 MG tablet Commonly known as: LOTENSIN   loperamide 2 MG capsule Commonly known as: IMODIUM   ripretinib 50 MG tablet Commonly known as: QINLOCK   solifenacin 5 MG tablet Commonly known as: VESICARE       TAKE these medications    acetaminophen 500 MG tablet Commonly known as: TYLENOL Take 2 tablets (1,000 mg total) by mouth 3 (three) times daily.   alum & mag hydroxide-simeth 200-200-20 MG/5ML suspension Commonly  known as: MAALOX/MYLANTA Take 30 mLs by mouth every 4 (four) hours as needed for indigestion.   diphenoxylate-atropine 2.5-0.025 MG tablet Commonly known as: LOMOTIL Take 1-2 tablets by mouth 4 times daily as needed for diarrhea or loose stool (max of 8 tablets daily)   docusate sodium 100 MG capsule Commonly known as: COLACE Take 1 capsule (100 mg total) by mouth 2 (two) times daily.   Dotti 0.025 MG/24HR Generic drug: estradiol APPLY  1  PATCH TOPICALLY TWICE WEEKLY What changed: See the new instructions.   Eliquis 5 MG Tabs tablet Generic drug: apixaban TAKE 1 TABLET TWICE DAILY   ferrous sulfate 325 (65 FE) MG tablet Take 1 tablet (325 mg total) by mouth 2 (two) times daily with a meal. What changed: when to take this   HYDROmorphone 4 MG tablet Commonly known as: Dilaudid Take 2-3 tablets (8-12 mg total) by mouth every 4 (four) hours as needed for severe pain.   magnesium oxide 400 (240 Mg) MG tablet Commonly known as: MAG-OX TAKE 1 TABLET EVERY DAY   methocarbamol 500 MG tablet Commonly known as: ROBAXIN Take 1 tablet (500 mg total) by mouth every 6 (six) hours as needed for up  to 5 days for muscle spasms.   morphine 60 MG 12 hr tablet Commonly known as: MS CONTIN Take 1 tablet (60 mg total) by mouth every 12 (twelve) hours.   omeprazole 40 MG capsule Commonly known as: PRILOSEC TAKE 1 CAPSULE EVERY DAY   ondansetron 8 MG tablet Commonly known as: ZOFRAN TAKE 1 TABLET EVERY 8 (EIGHT) HOURS AS NEEDED FOR NAUSEA OR VOMITING. What changed: See the new instructions.   polyethylene glycol 17 g packet Commonly known as: MIRALAX / GLYCOLAX Take 17 g by mouth daily as needed for mild constipation.   potassium chloride 10 MEQ CR capsule Commonly known as: MICRO-K TAKE 2 CAPSULES EVERY DAY (SUBSTITUTED FOR  MICRO-K) What changed: See the new instructions.   predniSONE 10 MG tablet Commonly known as: DELTASONE Take 1 tablet (10 mg total) by mouth daily with breakfast.   Vitamin D (Ergocalciferol) 1.25 MG (50000 UNIT) Caps capsule Commonly known as: DRISDOL Take 1 capsule (50,000 Units total) by mouth every 7 (seven) days.        Follow-up Information     Sheral Apley, MD. Schedule an appointment as soon as possible for a visit in 2 week(s).   Specialty: Orthopedic Surgery Why: for post-op check up Contact information: [address] [place] [address] [phone]                  Time coordinating discharge: 39 minutes  Signed:  Nima Kemppainen  [facility] 08/09/2022, 10:22 AM

## 2022-08-11 ENCOUNTER — Other Ambulatory Visit (HOSPITAL_BASED_OUTPATIENT_CLINIC_OR_DEPARTMENT_OTHER): Payer: Self-pay

## 2022-08-11 MED ORDER — HYDROMORPHONE HCL 4 MG PO TABS
12.0000 mg | ORAL_TABLET | ORAL | 0 refills | Status: DC
  Filled 2022-08-11: qty 252, 14d supply, fill #0

## 2022-08-14 ENCOUNTER — Ambulatory Visit: Payer: Self-pay | Admitting: Licensed Clinical Social Worker

## 2022-08-14 ENCOUNTER — Encounter: Payer: Self-pay | Admitting: *Deleted

## 2022-08-14 NOTE — Patient Outreach (Signed)
  Care Coordination   Follow Up Visit Note   08/14/2022 Name: Brianna Walker MRN: [id number] DOB: November 02, 1945  Brianna Walker is a 77 y.o. year old female who sees Corwin Levins, MD for primary care. I spoke with  Brianna Walker / Brianna Walker, spouse of client ,via phone today .  What matters to the patients health and wellness today? LCSW spoke with Brianna Walker, spouse of client today. Brianna Walker reported to LCSW that client had died on August 17, 2022.   LCSW called client phone number today; LCSW spoke via phone with Brianna Walker, spouse of client. Brianna Walker reported to LCSW today that client had died on 2022-08-17.  LCSW expressed sympathies to [name] on the death of Brianna Walker. [name] said family members were in process of making funeral arrangement for [name].   SDOH assessments and interventions completed:  No     Care Coordination Interventions:  No, not indicated   Follow up plan: No further interventions are needed. Client expired on 08/17/2022   Encounter Outcome:  Pt. Visit Completed   Kelton Pillar.Yaileen Hofferber MSW, LCSW Licensed Visual merchandiser [facility] Care Management [phone]

## 2022-08-14 NOTE — Progress Notes (Signed)
Call from [facility], Sherri Rad that patient died on 08-30-2022. MD aware.

## 2022-08-14 NOTE — Patient Instructions (Signed)
Visit Information  Thank you for taking time to visit with me today. Please don't hesitate to contact me if I can be of assistance to you.     LCSW called client phone number today; LCSW spoke via phone with [name], spouse of client. Tahiri Kofoed reported to LCSW today that client had died on 2022/08/21.  LCSW expressed sympathies to [name] on the death of TAWNA BURTNETT. [name] said family members were in process of making funeral arrangement for [name].      No further interventions needed. Client expired on Aug 21, 2022   Please call the care guide team at [phone] if you need to cancel or reschedule your appointment.   If you are experiencing a Mental Health or Behavioral Health Crisis or need someone to talk to, please go to [facility] [address], [place] [phone])   The patient / Abegayle Curenton, spouse of client, verbalized understanding of instructions, educational materials, and care plan provided today and DECLINED offer to receive copy of patient instructions, educational materials, and care plan.   The patient / Myrtie Caracciolo, spouse of client, has been provided with contact information for the care management team and has been advised to call with any health related questions or concerns.   Kelton Pillar.Xayne Brumbaugh MSW, LCSW Licensed Visual merchandiser [facility] Care Management [phone]

## 2022-08-21 ENCOUNTER — Other Ambulatory Visit (HOSPITAL_BASED_OUTPATIENT_CLINIC_OR_DEPARTMENT_OTHER): Payer: Self-pay

## 2022-08-22 ENCOUNTER — Other Ambulatory Visit: Payer: Self-pay

## 2022-08-22 ENCOUNTER — Inpatient Hospital Stay: Payer: Medicare HMO | Admitting: Oncology

## 2022-08-22 ENCOUNTER — Other Ambulatory Visit (HOSPITAL_BASED_OUTPATIENT_CLINIC_OR_DEPARTMENT_OTHER): Payer: Self-pay

## 2022-08-27 ENCOUNTER — Other Ambulatory Visit: Payer: Self-pay | Admitting: Oncology

## 2022-08-30 DEATH — deceased

## 2022-11-09 ENCOUNTER — Encounter: Payer: Self-pay | Admitting: Gastroenterology

## 2022-12-22 ENCOUNTER — Other Ambulatory Visit (HOSPITAL_BASED_OUTPATIENT_CLINIC_OR_DEPARTMENT_OTHER): Payer: Self-pay
